# Patient Record
Sex: Female | Born: 1940
Health system: Southern US, Community
[De-identification: ages and names within clinical notes are randomized; demographics above are authoritative.]

## PROBLEM LIST (undated history)

## (undated) DIAGNOSIS — D649 Anemia, unspecified: Secondary | ICD-10-CM

## (undated) DIAGNOSIS — M50321 Other cervical disc degeneration at C4-C5 level: Secondary | ICD-10-CM

## (undated) DIAGNOSIS — I4891 Unspecified atrial fibrillation: Secondary | ICD-10-CM

## (undated) DIAGNOSIS — I272 Pulmonary hypertension, unspecified: Secondary | ICD-10-CM

## (undated) DIAGNOSIS — N189 Chronic kidney disease, unspecified: Secondary | ICD-10-CM

## (undated) DIAGNOSIS — E119 Type 2 diabetes mellitus without complications: Secondary | ICD-10-CM

## (undated) DIAGNOSIS — I509 Heart failure, unspecified: Secondary | ICD-10-CM

## (undated) DIAGNOSIS — I10 Essential (primary) hypertension: Secondary | ICD-10-CM

## (undated) HISTORY — PX: OTHER SURGICAL HISTORY: SHX169

## (undated) HISTORY — PX: ABDOMINAL HYSTERECTOMY: SHX81

---

## 1992-08-18 HISTORY — PX: MASTECTOMY: SHX3

## 1995-08-19 HISTORY — PX: ABDOMINAL HYSTERECTOMY: SHX81

## 2008-08-18 HISTORY — PX: OTHER SURGICAL HISTORY: SHX169

## 2009-10-19 HISTORY — PX: CHOLECYSTECTOMY: SHX55

## 2017-09-23 LAB — PULMONARY FUNCTION TEST

## 2019-03-28 ENCOUNTER — Ambulatory Visit (INDEPENDENT_AMBULATORY_CARE_PROVIDER_SITE_OTHER): Payer: Medicare Other | Admitting: Physician Assistant

## 2019-03-28 ENCOUNTER — Encounter: Payer: Self-pay | Admitting: Physician Assistant

## 2019-03-28 ENCOUNTER — Other Ambulatory Visit: Payer: Self-pay

## 2019-03-28 VITALS — BP 132/70 | HR 64 | Temp 98.7°F | Resp 20 | Ht 66.0 in | Wt 245.0 lb

## 2019-03-28 DIAGNOSIS — G4730 Sleep apnea, unspecified: Secondary | ICD-10-CM

## 2019-03-28 DIAGNOSIS — G4733 Obstructive sleep apnea (adult) (pediatric): Secondary | ICD-10-CM | POA: Insufficient documentation

## 2019-03-28 DIAGNOSIS — J309 Allergic rhinitis, unspecified: Secondary | ICD-10-CM | POA: Insufficient documentation

## 2019-03-28 DIAGNOSIS — F329 Major depressive disorder, single episode, unspecified: Secondary | ICD-10-CM

## 2019-03-28 DIAGNOSIS — F32A Depression, unspecified: Secondary | ICD-10-CM

## 2019-03-28 DIAGNOSIS — M1990 Unspecified osteoarthritis, unspecified site: Secondary | ICD-10-CM | POA: Insufficient documentation

## 2019-03-28 DIAGNOSIS — E119 Type 2 diabetes mellitus without complications: Secondary | ICD-10-CM

## 2019-03-28 DIAGNOSIS — I509 Heart failure, unspecified: Secondary | ICD-10-CM

## 2019-03-28 DIAGNOSIS — N184 Chronic kidney disease, stage 4 (severe): Secondary | ICD-10-CM

## 2019-03-28 DIAGNOSIS — I270 Primary pulmonary hypertension: Secondary | ICD-10-CM | POA: Diagnosis not present

## 2019-03-28 DIAGNOSIS — Z9989 Dependence on other enabling machines and devices: Secondary | ICD-10-CM | POA: Insufficient documentation

## 2019-03-28 LAB — POCT INR
INR: 1.8 — AB (ref 2.0–3.0)
PT: 22.1

## 2019-03-28 MED ORDER — FARXIGA 5 MG PO TABS: 5.0000 mg | ORAL_TABLET | Freq: Every day | ORAL | 1 refills | Status: DC

## 2019-03-28 MED ORDER — TRULICITY 0.75 MG/0.5ML ~~LOC~~ SOAJ: 0.7500 mg | SUBCUTANEOUS | 2 refills | Status: AC

## 2019-03-28 NOTE — Progress Notes (Signed)
Patient: Ann Flynn Female    DOB: 03-01-1941   78 y.o.   MRN: [id number] Visit Date: 03/29/2019  Today's Provider: Trinna Post, PA-C   Chief Complaint  Patient presents with  . New Patient (Initial Visit)   Subjective:   HPI Patient comes in today to establish care into the practice. She lives in [place] and has moved from the [place] area. Her on child moved out here and her other child lives in [place], [place].  She has multiple chronic issues.   DM II: Has had diabetes for 20 years. Last eye exam in [place] within the past year, she believes 06/2018. She is on an ARB and a statin. She is currently taking Toujeo 50 units at night time. She is also taking trulicity 6.50 mg suQ once weekly and farxiga 5 mg QD. She was previously on metformin but was switched due to poor kidney function. She has also tried glyburide as well without successful glycemic control.   Lab Results  Component Value Date   HGBA1C 7.2 (H) 03/28/2019   CHF/HTN: She is on Lasix 40 mg every other day. She is on metoprolol tartrate 25 mg BID. She is also on losartan 100 mg daily. She was followed by a cardiologist in [place]. She denies ankle swelling and SOB above baseline.   Pulmonary HTN: She was diagnosed with this during cardiac catheterization for heart blockage. She is currently on Letairis 5 mg daily and also cialis. She uses portable oxygen 2-5 L via nasal canula.   Depression: She is currently on effexor 75 mg daily for this.  She reports that she needs her PT/INR and other labs checked. She has not had this done in a while. She reports that she is currently taking warfarin 88m daily except 2 mg on Sun and Wed. She is on warfarin for history of PE.   She also mentions that she has had pain in her left arm for about 2-3 months. She feels that this could be a pulled muscle. She is taking Tylenol BID for this and it does not seem to have gotten any better.    Allergies  Allergen  Reactions  . Penicillins Rash  . Sulfa Antibiotics Rash     Current Outpatient Medications:  .  ambrisentan (LETAIRIS) 5 MG tablet, Take 5 mg by mouth daily., Disp: , Rfl:  .  atorvastatin (LIPITOR) 40 MG tablet, Take 40 mg by mouth daily., Disp: , Rfl:  .  dapagliflozin propanediol (FARXIGA) 5 MG TABS tablet, Take 5 mg by mouth daily., Disp: 90 tablet, Rfl: 1 .  Dulaglutide (TRULICITY) 03.54MSF/6.8LESOPN, Inject 0.75 mg into the skin once a week., Disp: 2 mL, Rfl: 2 .  furosemide (LASIX) 40 MG tablet, Take 40 mg by mouth., Disp: , Rfl:  .  Insulin Glargine, 1 Unit Dial, (TOUJEO SOLOSTAR) 300 UNIT/ML SOPN, Inject into the skin. 50 units daily., Disp: , Rfl:  .  losartan (COZAAR) 100 MG tablet, Take 100 mg by mouth daily., Disp: , Rfl:  .  metoprolol tartrate (LOPRESSOR) 25 MG tablet, Take 25 mg by mouth 2 (two) times daily., Disp: , Rfl:  .  omeprazole (PRILOSEC) 40 MG capsule, Take 40 mg by mouth daily., Disp: , Rfl:  .  venlafaxine (EFFEXOR) 75 MG tablet, Take 75 mg by mouth daily., Disp: , Rfl:  .  warfarin (COUMADIN) 4 MG tablet, Take 4 mg by mouth daily., Disp: , Rfl:   Review of  Systems  Constitutional: Positive for fatigue.  Respiratory: Negative for shortness of breath.   Cardiovascular: Negative for chest pain and leg swelling.  Musculoskeletal: Positive for arthralgias, back pain, myalgias and neck pain.  Neurological: Negative for dizziness, light-headedness and headaches.    Social History   Tobacco Use  . Smoking status: Never Smoker  . Smokeless tobacco: Never Used  Substance Use Topics  . Alcohol use: Never    Frequency: Never      Objective:   BP 132/70 (BP Location: Left Arm, Patient Position: Sitting, Cuff Size: Large)   Pulse 64   Temp 98.7 F (37.1 C)   Resp 20   Ht _0  (1.676 m)   Wt 245 lb (111.1 kg)   SpO2 95% Comment: w/ 2L of O2  BMI 39.54 kg/m  Vitals:   03/28/19 1522  BP: 132/70  Pulse: 64  Resp: 20  Temp: 98.7 F (37.1 C)  SpO2:  95%  Weight: 245 lb (111.1 kg)  Height: _1  (1.676 m)     Physical Exam Constitutional:      Appearance: Normal appearance.  Cardiovascular:     Rate and Rhythm: Normal rate and regular rhythm.  Pulmonary:     Effort: Pulmonary effort is normal. No respiratory distress.     Breath sounds: Normal breath sounds.     Comments: Using 2L oxygen via nasal canula.  Musculoskeletal:     Left shoulder: She exhibits decreased range of motion.     Comments: Difficulty with abduction of left shoulder past 90 degrees. Some crepitus present.   Skin:    General: Skin is warm and dry.  Neurological:     Mental Status: She is alert and oriented to person, place, and time. Mental status is at baseline.  Psychiatric:        Mood and Affect: Mood normal.        Behavior: Behavior normal.     Diabetic Foot Exam - Simple   Simple Foot Form Diabetic Foot exam was performed with the following findings: Yes 03/28/2019  2:59 PM  Visual Inspection No deformities, no ulcerations, no other skin breakdown bilaterally: Yes Sensation Testing See comments: Yes Pulse Check Posterior Tibialis and Dorsalis pulse intact bilaterally: Yes Comments She has diminished sensation across her big toes and the balls of her feet bilaterally.       Results for orders placed or performed in visit on 03/28/19  Comprehensive Metabolic Panel (CMET)  Result Value Ref Range   Glucose 157 (H) 65 - 99 mg/dL   BUN 42 (H) 8 - 27 mg/dL   Creatinine, Ser 1.88 (H) 0.57 - 1.00 mg/dL   GFR calc non Af Amer 25 (L) >59 mL/min/1.73   GFR calc Af Amer 29 (L) >59 mL/min/1.73   BUN/Creatinine Ratio 22 12 - 28   Sodium 141 134 - 144 mmol/L   Potassium 5.1 3.5 - 5.2 mmol/L   Chloride 103 96 - 106 mmol/L   CO2 24 20 - 29 mmol/L   Calcium 9.2 8.7 - 10.3 mg/dL   Total Protein 6.1 6.0 - 8.5 g/dL   Albumin 3.9 3.7 - 4.7 g/dL   Globulin, Total 2.2 1.5 - 4.5 g/dL   Albumin/Globulin Ratio 1.8 1.2 - 2.2   Bilirubin Total 0.2 0.0 -  1.2 mg/dL   Alkaline Phosphatase 81 39 - 117 IU/L   AST 12 0 - 40 IU/L   ALT 8 0 - 32 IU/L  HgB A1c  Result Value Ref  Range   Hgb A1c MFr Bld 7.2 (H) 4.8 - 5.6 %   Est. average glucose Bld gHb Est-mCnc 160 mg/dL  CBC with Differential  Result Value Ref Range   WBC 5.9 3.4 - 10.8 x10E3/uL   RBC 4.19 3.77 - 5.28 x10E6/uL   Hemoglobin 12.1 11.1 - 15.9 g/dL   Hematocrit 38.5 34.0 - 46.6 %   MCV 92 79 - 97 fL   MCH 28.9 26.6 - 33.0 pg   MCHC 31.4 (L) 31.5 - 35.7 g/dL   RDW 14.3 11.7 - 15.4 %   Platelets 227 150 - 450 x10E3/uL   Neutrophils 61 Not Estab. %   Lymphs 27 Not Estab. %   Monocytes 8 Not Estab. %   Eos 3 Not Estab. %   Basos 1 Not Estab. %   Neutrophils Absolute 3.6 1.4 - 7.0 x10E3/uL   Lymphocytes Absolute 1.6 0.7 - 3.1 x10E3/uL   Monocytes Absolute 0.5 0.1 - 0.9 x10E3/uL   EOS (ABSOLUTE) 0.2 0.0 - 0.4 x10E3/uL   Basophils Absolute 0.0 0.0 - 0.2 x10E3/uL   Immature Granulocytes 0 Not Estab. %   Immature Grans (Abs) 0.0 0.0 - 0.1 x10E3/uL  Lipid Profile  Result Value Ref Range   Cholesterol, Total 186 100 - 199 mg/dL   Triglycerides 240 (H) 0 - 149 mg/dL   HDL 43 >39 mg/dL   VLDL Cholesterol Cal 48 (H) 5 - 40 mg/dL   LDL Calculated 95 0 - 99 mg/dL   Chol/HDL Ratio 4.3 0.0 - 4.4 ratio  POCT INR  Result Value Ref Range   INR 1.8 (A) 2.0 - 3.0   PT 22.1        Assessment & Plan    1. Congestive heart failure, unspecified HF chronicity, unspecified heart failure type ([place])  Euvolemic today. Referral to cardiology.   - Comprehensive Metabolic Panel (CMET) - HgB A1c - Ambulatory referral to Cardiology  2. Diabetes mellitus without complication (HCC)  J4N is 7.2% which is fairly well controlled. Continue medications as below.  - Comprehensive Metabolic Panel (CMET) - CBC with Differential - Lipid Profile - dapagliflozin propanediol (FARXIGA) 5 MG TABS tablet; Take 5 mg by mouth daily.  Dispense: 90 tablet; Refill: 1 - Dulaglutide (TRULICITY) 8.29 FA/2.1HY  SOPN; Inject 0.75 mg into the skin once a week.  Dispense: 2 mL; Refill: 2  3. Pulmonary hypertension, primary ([place])  - Ambulatory referral to Pulmonology - POCT INR  4. Depression, unspecified depression type  Continue effexor 75 mg daily.   5. Sleep apnea, unspecified type  Continue CPAP.  6. Allergic rhinitis, unspecified seasonality, unspecified trigger   7. Arthritis  Suspect rotator cuff injury superimposed onto arthritis.   8. CKD IV  Patient with known CKD. Special attention to renal dosing of medications.   The entirety of the information documented in the History of Present Illness, Review of Systems and Physical Exam were personally obtained by me. Portions of this information were initially documented by Jennings Books, CMA and reviewed by me for thoroughness and accuracy.   F/u 3 months    Trinna Post, PA-C  [facility]

## 2019-03-29 LAB — CBC WITH DIFFERENTIAL/PLATELET
Basophils Absolute: 0 10*3/uL (ref 0.0–0.2)
Basos: 1 %
EOS (ABSOLUTE): 0.2 10*3/uL (ref 0.0–0.4)
Eos: 3 %
Hematocrit: 38.5 % (ref 34.0–46.6)
Hemoglobin: 12.1 g/dL (ref 11.1–15.9)
Immature Grans (Abs): 0 10*3/uL (ref 0.0–0.1)
Immature Granulocytes: 0 %
Lymphocytes Absolute: 1.6 10*3/uL (ref 0.7–3.1)
Lymphs: 27 %
MCH: 28.9 pg (ref 26.6–33.0)
MCHC: 31.4 g/dL — ABNORMAL LOW (ref 31.5–35.7)
MCV: 92 fL (ref 79–97)
Monocytes Absolute: 0.5 10*3/uL (ref 0.1–0.9)
Monocytes: 8 %
Neutrophils Absolute: 3.6 10*3/uL (ref 1.4–7.0)
Neutrophils: 61 %
Platelets: 227 10*3/uL (ref 150–450)
RBC: 4.19 x10E6/uL (ref 3.77–5.28)
RDW: 14.3 % (ref 11.7–15.4)
WBC: 5.9 10*3/uL (ref 3.4–10.8)

## 2019-03-29 LAB — LIPID PANEL
Chol/HDL Ratio: 4.3 ratio (ref 0.0–4.4)
Cholesterol, Total: 186 mg/dL (ref 100–199)
HDL: 43 mg/dL (ref >39)
LDL Calculated: 95 mg/dL (ref 0–99)
Triglycerides: 240 mg/dL — ABNORMAL HIGH (ref 0–149)
VLDL Cholesterol Cal: 48 mg/dL — ABNORMAL HIGH (ref 5–40)

## 2019-03-29 LAB — COMPREHENSIVE METABOLIC PANEL
ALT: 8 IU/L (ref 0–32)
AST: 12 IU/L (ref 0–40)
Albumin/Globulin Ratio: 1.8 (ref 1.2–2.2)
Albumin: 3.9 g/dL (ref 3.7–4.7)
Alkaline Phosphatase: 81 IU/L (ref 39–117)
BUN/Creatinine Ratio: 22 (ref 12–28)
BUN: 42 mg/dL — ABNORMAL HIGH (ref 8–27)
Bilirubin Total: 0.2 mg/dL (ref 0.0–1.2)
CO2: 24 mmol/L (ref 20–29)
Calcium: 9.2 mg/dL (ref 8.7–10.3)
Chloride: 103 mmol/L (ref 96–106)
Creatinine, Ser: 1.88 mg/dL — ABNORMAL HIGH (ref 0.57–1.00)
GFR calc Af Amer: 29 mL/min/{1.73_m2} — ABNORMAL LOW (ref >59)
GFR calc non Af Amer: 25 mL/min/{1.73_m2} — ABNORMAL LOW (ref >59)
Globulin, Total: 2.2 g/dL (ref 1.5–4.5)
Glucose: 157 mg/dL — ABNORMAL HIGH (ref 65–99)
Potassium: 5.1 mmol/L (ref 3.5–5.2)
Sodium: 141 mmol/L (ref 134–144)
Total Protein: 6.1 g/dL (ref 6.0–8.5)

## 2019-03-29 LAB — HEMOGLOBIN A1C
Est. average glucose Bld gHb Est-mCnc: 160 mg/dL
Hgb A1c MFr Bld: 7.2 % — ABNORMAL HIGH (ref 4.8–5.6)

## 2019-04-01 ENCOUNTER — Telehealth: Payer: Self-pay

## 2019-04-01 DIAGNOSIS — N184 Chronic kidney disease, stage 4 (severe): Secondary | ICD-10-CM | POA: Insufficient documentation

## 2019-04-01 NOTE — Patient Instructions (Signed)
Diabetes Mellitus and Exercise Exercising regularly is important for your overall health, especially when you have diabetes (diabetes mellitus). Exercising is not only about losing weight. It has many other health benefits, such as increasing muscle strength and bone density and reducing body fat and stress. This leads to improved fitness, flexibility, and endurance, all of which result in better overall health. Exercise has additional benefits for people with diabetes, including:  Reducing appetite.  Helping to lower and control blood glucose.  Lowering blood pressure.  Helping to control amounts of fatty substances (lipids) in the blood, such as cholesterol and triglycerides.  Helping the body to respond better to insulin (improving insulin sensitivity).  Reducing how much insulin the body needs.  Decreasing the risk for heart disease by: ? Lowering cholesterol and triglyceride levels. ? Increasing the levels of good cholesterol. ? Lowering blood glucose levels. What is my activity plan? Your health care provider or certified diabetes educator can help you make a plan for the type and frequency of exercise (activity plan) that works for you. Make sure that you:  Do at least 150 minutes of moderate-intensity or vigorous-intensity exercise each week. This could be brisk walking, biking, or water aerobics. ? Do stretching and strength exercises, such as yoga or weightlifting, at least 2 times a week. ? Spread out your activity over at least 3 days of the week.  Get some form of physical activity every day. ? Do not go more than 2 days in a row without some kind of physical activity. ? Avoid being inactive for more than 30 minutes at a time. Take frequent breaks to walk or stretch.  Choose a type of exercise or activity that you enjoy, and set realistic goals.  Start slowly, and gradually increase the intensity of your exercise over time. What do I need to know about managing my  diabetes?   Check your blood glucose before and after exercising. ? If your blood glucose is 240 mg/dL (13.3 mmol/L) or higher before you exercise, check your urine for ketones. If you have ketones in your urine, do not exercise until your blood glucose returns to normal. ? If your blood glucose is 100 mg/dL (5.6 mmol/L) or lower, eat a snack containing 15-20 grams of carbohydrate. Check your blood glucose 15 minutes after the snack to make sure that your level is above 100 mg/dL (5.6 mmol/L) before you start your exercise.  Know the symptoms of low blood glucose (hypoglycemia) and how to treat it. Your risk for hypoglycemia increases during and after exercise. Common symptoms of hypoglycemia can include: ? Hunger. ? Anxiety. ? Sweating and feeling clammy. ? Confusion. ? Dizziness or feeling light-headed. ? Increased heart rate or palpitations. ? Blurry vision. ? Tingling or numbness around the mouth, lips, or tongue. ? Tremors or shakes. ? Irritability.  Keep a rapid-acting carbohydrate snack available before, during, and after exercise to help prevent or treat hypoglycemia.  Avoid injecting insulin into areas of the body that are going to be exercised. For example, avoid injecting insulin into: ? The arms, when playing tennis. ? The legs, when jogging.  Keep records of your exercise habits. Doing this can help you and your health care provider adjust your diabetes management plan as needed. Write down: ? Food that you eat before and after you exercise. ? Blood glucose levels before and after you exercise. ? The type and amount of exercise you have done. ? When your insulin is expected to peak, if you use   insulin. Avoid exercising at times when your insulin is peaking.  When you start a new exercise or activity, work with your health care provider to make sure the activity is safe for you, and to adjust your insulin, medicines, or food intake as needed.  Drink plenty of water while  you exercise to prevent dehydration or heat stroke. Drink enough fluid to keep your urine clear or pale yellow. Summary  Exercising regularly is important for your overall health, especially when you have diabetes (diabetes mellitus).  Exercising has many health benefits, such as increasing muscle strength and bone density and reducing body fat and stress.  Your health care provider or certified diabetes educator can help you make a plan for the type and frequency of exercise (activity plan) that works for you.  When you start a new exercise or activity, work with your health care provider to make sure the activity is safe for you, and to adjust your insulin, medicines, or food intake as needed. This information is not intended to replace advice given to you by your health care provider. Make sure you discuss any questions you have with your health care provider. Document Released: 10/25/2003 Document Revised: 02/26/2017 Document Reviewed: 01/14/2016 Elsevier Patient Education  2020 [employer].

## 2019-04-01 NOTE — Telephone Encounter (Signed)
Patient states she was seen earlier in the week and had her PT/INR done.  She said no one gave her instruction and when the nurse came in to release her she said they would call.  Patient has not heard anything and would like to know if there are any changes in her Coumadin dose.  CB # is [phone]

## 2019-04-01 NOTE — Telephone Encounter (Signed)
INR is 1.8, subtherapeutic. She is taking 4 mg every day except sun and Wednesday when she takes 2 mg. I would like her to take 4 mg every day. Schedule her a follow up at the coumadin clinic next week. She is on this for a pulmonary embolism, correct?

## 2019-04-01 NOTE — Telephone Encounter (Signed)
Pt advised.  Apt 04/06/2019 at 10:10   Thanks,    -Mickel Baas

## 2019-04-05 ENCOUNTER — Telehealth: Payer: Self-pay | Admitting: *Deleted

## 2019-04-05 MED ORDER — ATORVASTATIN CALCIUM 80 MG PO TABS: 80.0000 mg | ORAL_TABLET | Freq: Every day | ORAL | 0 refills | Status: DC

## 2019-04-05 NOTE — Telephone Encounter (Signed)
LMOVM for pt to return call.

## 2019-04-05 NOTE — Telephone Encounter (Signed)
-----  Message from Trinna Post, [place] sent at 04/01/2019  1:21 PM EDT ----- See telephone note about INR. Kidney function decreased as patient reported it was. A1c 7.2, slightly above goal but I think this is OK for now. LDL cholesterol is above goal and I would recommend increasing Lipitor to 80 mg nightly. If agreeable please send in lipitor 80 mg qhs #90 and set her up for 3 month follow up please.

## 2019-04-05 NOTE — Telephone Encounter (Signed)
Pt returned call  teri

## 2019-04-05 NOTE — Telephone Encounter (Signed)
Patient advised as below. Patient verbalizes understanding and is in agreement with treatment plan.

## 2019-04-06 ENCOUNTER — Other Ambulatory Visit: Payer: Self-pay

## 2019-04-06 ENCOUNTER — Other Ambulatory Visit: Payer: Self-pay | Admitting: Physician Assistant

## 2019-04-06 ENCOUNTER — Ambulatory Visit (INDEPENDENT_AMBULATORY_CARE_PROVIDER_SITE_OTHER): Payer: Medicare Other

## 2019-04-06 DIAGNOSIS — I639 Cerebral infarction, unspecified: Secondary | ICD-10-CM | POA: Diagnosis not present

## 2019-04-06 DIAGNOSIS — Z8673 Personal history of transient ischemic attack (TIA), and cerebral infarction without residual deficits: Secondary | ICD-10-CM

## 2019-04-06 LAB — POCT INR
INR: 2.5 (ref 2.0–3.0)
PT: 30.6

## 2019-04-06 NOTE — Progress Notes (Signed)
New patient to [facility]madin clinic[facility]

## 2019-04-06 NOTE — Patient Instructions (Signed)
Description   4 mg daily

## 2019-04-08 ENCOUNTER — Telehealth: Payer: Self-pay | Admitting: Physician Assistant

## 2019-04-08 DIAGNOSIS — E119 Type 2 diabetes mellitus without complications: Secondary | ICD-10-CM

## 2019-04-08 MED ORDER — SITAGLIPTIN PHOSPHATE 25 MG PO TABS: 25.0000 mg | ORAL_TABLET | Freq: Every day | ORAL | 0 refills | Status: DC

## 2019-04-08 NOTE — Telephone Encounter (Signed)
Patient advised as directed below and she agrees about about the Januvia  25 mg. Medication sent in to [facility]

## 2019-04-08 NOTE — Addendum Note (Signed)
Addended by: Doristine Devoid on: 04/08/2019 02:19 PM   Modules accepted: Orders

## 2019-04-08 NOTE — Telephone Encounter (Signed)
Got a fax from her insurance stating that Wilder Glade is not covered. However, upon further review, her kidney function is too low for farxiga and she needs to stop it. If she is agreeable, I will send her in a different diabetes medication called januvia 25 mg #90 with one refill.

## 2019-04-11 ENCOUNTER — Telehealth: Payer: Self-pay | Admitting: Internal Medicine

## 2019-04-11 NOTE — Progress Notes (Addendum)
[facility] Consultation      Assessment and Plan:  Pulmonary hypertension. -Etiology uncertain, may be secondary to pulmonary embolism. - Currently appears to be well controlled with class II dyspnea. - Continue Letairis, Cialis.  Will obtain copy of right and left heart caths performed in 2016 in [place].  Pulmonary embolism. - History of pulmonary embolism, in 2009. -Patient has been on Coumadin since that time with monitoring of INR approximately once every month. - Discussed the potential change to newer oral anticoagulant, however will review history from [place] for changing to see if there is any specific indication that required Coumadin.  Dyspnea on exertion.  Chronic hypoxic respiratory failure. - Suspect multifactorial due to pulmonary hypertension, deconditioning.  Patient was previously on 5 L of oxygen by a portable oxygen concentrator, which she has had to surrender since she moved. - She notes her dyspnea has improved since moving, likely due to altitude change.  We will check a 6-minute walk, and see if she qualifies for POC at the same time.  Obstructive sleep apnea. - Appears to be doing well with current CPAP, will continue.  Encouraged to use CPAP every night for the entire night. - We will see if we can get a download on her machine.  Orders Placed This Encounter  Procedures   Pulmonary Function Test [facility] Only   6 minute walk   Return in about 3 months (around 07/13/2019).     Date: 04/12/2019  MRN# [id number] Ann Flynn 04-30-1941   Dereon Williamsen is a 78 y.o. old female seen in consultation for chief complaint of:    Chief Complaint  Patient presents with   pulmonary consult    per Carles Collet- pt reports of sob with exertion. 2L bled into cpap. wears cpap avg 6hr nighly    HPI:  Cookie Pore is a 78 y.o. old female. She lives in [place] and has moved from the [place] area. Her history includes CHF, PE,  pulmonary hypertension, OSA. She tells me that she was diagnosed with [place] on a heart cath in December 15 2014 at [facility] in [place], she was seeing a heart doctor at that time and was having dyspnea. She was then started on Letairis and cialis, she has not been on any other medications, she has does not have to pay anything out of pocket through a program through [facility] that pays the 20% that insurance does not cover.  Her pulmonary doc was Facilities manager at [facility].  She feels that her breathing has been "great", she was on portable oxygen at 5L when she was in [place], but the portable o2 was not covered here but she notes that both her breathing and oxygen requirements improved since she came here. She lived at an altitiude of about 1 mile. Currently she can walk a flight of stairs but she has to rest at the top. She can walk a [employer]. She is a never smoker.  She had stroke in 2010, and then had a carotid artery surgery which was the cause.  She had PE in 2009 and has been on coumadin since then.   She has a diagnosis of OSA, diagnosed several years ago, she uses CPAP every night for about 6 hours per night.   Addendum 04/13/19 review of outside records. **12/16/2014 right and left heart catheterization with nitric oxide inhalation, Pressure data resting: - Mean right atrial pressure 2 Right ventricule 704/8 and diastolic - Pulmonary artery  0.03/19, mean 51. Pulmonary artery wedge mean 5 - Ascending thoracic aorta 111/53, mean 75. -Fick cardiac output 4.2 L. -Pulmonary resistance 12.25 Woods units. With nitric oxide inhalation - Pulmonary artery 89/29, mean 42. -Right ventricle 09/3 and diastolic. -Right atrial 2. -Fick cardiac output 4.94 L. - Pulmonary resistance 15.91 Woods units.  Overall the patient was found to have moderate coronary artery disease, which does not appear to be significant for coronary intervention.  She was found to have evidence of severe pulmonary  hypertension, not significantly responding to nitric oxide administration.  Addendum 04/28/2019 review of outside records. -Chronic hypoxic respiratory failure secondary to pulmonary hypertension, currently on 3 L oxygen at rest, 5 L with exertion, using hydrogen, completed pulmonary rehab. - Pulmonary hypertension, likely group lung disease, continue Lasix 40 mg every other day, not CCB candidate, continue dual therapy with tadalafil, ambrisentan. - OSA continue CPAP of 11 cm H2O with 3 L of oxygen.  Continue nasal steroid. - Restrictive lung disease, likely secondary to body habitus. - Lung nodules, new right lower lobe lung nodule seen on CT chest 10/02/17, PET negative therefore thought to be benign.  On follow-up CT chest 05/2018 right lower lobe nodule resolved with stable bilateral subcentimeter nodules, no follow-up imaging was deemed necessary. - PLMS, stable consider treatment if needed.  - Sleep study 12/19/2014>>, split-night, AHI of 29.6, started on CPAP titration, as high as an AHI of 42.3 on room air, CPAP was titrated to 13 cm H2O with 2 L oxygen, final recommendation was CPAP at 13 cm H2O with 6 L supplemental oxygen with ResMed Mirage fracture standard with a chinstrap.  Weight loss was recommended. 45 minutes was spent in review of these records.  **CPAP titration study 12/29/2014>> patient was noted to be recently been using CPAP at 11 with 2 L of oxygen.  CPAP was titrated to a final pressure of 13 with 6 L of supplemental oxygen with ResMed Mirage fracture standard.  Patient wore a chinstrap.  Patient had PLMS without significant arousals.  Weight loss was recommended.  PMHX:   OSA, Pulm htn. DM, HTN, depression, hypercholesterolemia. TIA 2010, 2010, 2013.  PE 2009.  Family Hx:  Noncontributory.  Social Hx:   Social History   Tobacco Use   Smoking status: Never Smoker   Smokeless tobacco: Never Used  Substance Use Topics   Alcohol use: Never    Frequency: Never    Drug use: Never   Medication:    Current Outpatient Medications:    ambrisentan (LETAIRIS) 5 MG tablet, Take 5 mg by mouth daily., Disp: , Rfl:    atorvastatin (LIPITOR) 80 MG tablet, Take 1 tablet (80 mg total) by mouth daily., Disp: 90 tablet, Rfl: 0   dapagliflozin propanediol (FARXIGA) 5 MG TABS tablet, Take 5 mg by mouth daily., Disp: 90 tablet, Rfl: 1   Dulaglutide (TRULICITY) 2.35 TD/3.2KG SOPN, Inject 0.75 mg into the skin once a week., Disp: 2 mL, Rfl: 2   furosemide (LASIX) 40 MG tablet, Take 40 mg by mouth., Disp: , Rfl:    Insulin Glargine, 1 Unit Dial, (TOUJEO SOLOSTAR) 300 UNIT/ML SOPN, Inject into the skin. 50 units daily., Disp: , Rfl:    losartan (COZAAR) 100 MG tablet, Take 100 mg by mouth daily., Disp: , Rfl:    metoprolol tartrate (LOPRESSOR) 25 MG tablet, Take 25 mg by mouth 2 (two) times daily., Disp: , Rfl:    omeprazole (PRILOSEC) 40 MG capsule, Take 40 mg by mouth daily., Disp: ,  Rfl:    sitaGLIPtin (JANUVIA) 25 MG tablet, Take 1 tablet (25 mg total) by mouth daily., Disp: 90 tablet, Rfl: 0   tadalafil (CIALIS) 20 MG tablet, Take 40 mg by mouth daily., Disp: , Rfl:    venlafaxine (EFFEXOR) 75 MG tablet, Take 75 mg by mouth daily., Disp: , Rfl:    warfarin (COUMADIN) 4 MG tablet, Take 4 mg by mouth daily., Disp: , Rfl:    Allergies:  Penicillins and Sulfa antibiotics  Review of Systems: Gen:  Denies  fever, sweats, chills HEENT: Denies blurred vision, double vision. bleeds, sore throat Cvc:  No dizziness, chest pain. Resp:   Denies cough or sputum production, shortness of breath Gi: Denies swallowing difficulty, stomach pain. Gu:  Denies bladder incontinence, burning urine Ext:   No Joint pain, stiffness. Skin: No skin rash,  hives  Endoc:  No polyuria, polydipsia. Psych: No depression, insomnia. Other:  All other systems were reviewed with the patient and were negative other that what is mentioned in the HPI.   Physical Examination:   VS: BP  124/78 (BP Location: Left Arm, Cuff Size: Normal)    Pulse (!) 56    Temp (!) 97.2 F (36.2 C) (Temporal)    Ht _0  (1.651 m)    Wt 252 lb (114.3 kg)    SpO2 94%    BMI 41.93 kg/m   General Appearance: No distress  Neuro:without focal findings,  speech normal,  HEENT: PERRLA, EOM intact.   Pulmonary: normal breath sounds, No wheezing.  CardiovascularNormal S1,S2.  No m/r/g.   Abdomen: Benign, Soft, non-tender. Renal:  No costovertebral tenderness  GU:  No performed at this time. Endoc: No evident thyromegaly, no signs of acromegaly. Skin:   warm, no rashes, no ecchymosis  Extremities: normal, no cyanosis, clubbing.  Other findings:    LABORATORY PANEL:   CBC No results for input(s): WBC, HGB, HCT, PLT in the last 168 hours. ------------------------------------------------------------------------------------------------------------------  Chemistries  No results for input(s): NA, K, CL, CO2, GLUCOSE, BUN, CREATININE, CALCIUM, MG, AST, ALT, ALKPHOS, BILITOT in the last 168 hours.  Invalid input(s): GFRCGP ------------------------------------------------------------------------------------------------------------------  Cardiac Enzymes No results for input(s): TROPONINI in the last 168 hours. ------------------------------------------------------------  RADIOLOGY:  No results found.     Thank  you for the consultation and for allowing [facility] to assist in the care of your patient. Our recommendations are noted above.  Please contact us if we can be of further service.   Marda Stalker, M.D., F.C.C.P.  Board Certified in Internal Medicine, Pulmonary Medicine, [place], and Sleep Medicine.  [facility] Office Number: [phone]   04/12/2019

## 2019-04-11 NOTE — Telephone Encounter (Signed)

## 2019-04-12 ENCOUNTER — Other Ambulatory Visit: Payer: Self-pay

## 2019-04-12 ENCOUNTER — Ambulatory Visit: Payer: Medicare Other | Admitting: Internal Medicine

## 2019-04-12 ENCOUNTER — Encounter: Payer: Self-pay | Admitting: Internal Medicine

## 2019-04-12 VITALS — BP 124/78 | HR 56 | Temp 97.2°F | Ht 65.0 in | Wt 252.0 lb

## 2019-04-12 DIAGNOSIS — R0609 Other forms of dyspnea: Secondary | ICD-10-CM | POA: Diagnosis not present

## 2019-04-12 DIAGNOSIS — I270 Primary pulmonary hypertension: Secondary | ICD-10-CM | POA: Diagnosis not present

## 2019-04-12 DIAGNOSIS — G4733 Obstructive sleep apnea (adult) (pediatric): Secondary | ICD-10-CM

## 2019-04-12 NOTE — Patient Instructions (Addendum)
Will need to obtain records from your  heart cath in December 15 2014 at [facility] Also records from [place] at [place].  Continue with your pulmonary hypertension medications.   Restart using CPAP every night for the whole night.  Will check a walk test to see if you need oxygen and to see if you qualify for a Portable oxygen concentrator if you do.

## 2019-04-20 ENCOUNTER — Ambulatory Visit (INDEPENDENT_AMBULATORY_CARE_PROVIDER_SITE_OTHER): Payer: Medicare Other

## 2019-04-20 ENCOUNTER — Other Ambulatory Visit: Payer: Self-pay

## 2019-04-20 DIAGNOSIS — Z8673 Personal history of transient ischemic attack (TIA), and cerebral infarction without residual deficits: Secondary | ICD-10-CM

## 2019-04-20 LAB — POCT INR
INR: 3.7 — AB (ref 2.0–3.0)
PT: 44.8

## 2019-04-20 NOTE — Patient Instructions (Signed)
Description   4 mg daily except 2 mg on Wed

## 2019-04-27 ENCOUNTER — Telehealth: Payer: Self-pay | Admitting: Internal Medicine

## 2019-04-27 ENCOUNTER — Other Ambulatory Visit: Payer: Self-pay | Admitting: Physician Assistant

## 2019-04-27 MED ORDER — LOSARTAN POTASSIUM 100 MG PO TABS: 100.0000 mg | ORAL_TABLET | Freq: Every day | ORAL | 2 refills | Status: DC

## 2019-04-27 MED ORDER — OMEPRAZOLE 40 MG PO CPDR: 40.0000 mg | DELAYED_RELEASE_CAPSULE | Freq: Every day | ORAL | 2 refills | Status: DC

## 2019-04-27 NOTE — Telephone Encounter (Signed)
Called and spoke to pt, who states that PA is needed for tadalafil.  I have attempted to contact [facility] to start PA, and received recording that they are experiencing higher then normal call volumes. Will call back.

## 2019-04-27 NOTE — Telephone Encounter (Signed)
Please advise?

## 2019-04-27 NOTE — Telephone Encounter (Signed)
[facility] faxed refill request for the following medications:  losartan (COZAAR) 100 MG tablet  omeprazole (PRILOSEC) 40 MG capsule    Please advise.

## 2019-04-29 NOTE — Telephone Encounter (Signed)
Contacted [facility] and started PA for Cialis 17m. PA has been approved until 12/3/12020. Pt is aware and voiced her understanding.  Nothing further is needed.

## 2019-04-29 NOTE — Telephone Encounter (Signed)
Contacted [facility] PA line at [phone] and was placed on hold be >84mn Will call back

## 2019-05-04 ENCOUNTER — Other Ambulatory Visit: Payer: Self-pay

## 2019-05-04 ENCOUNTER — Ambulatory Visit (INDEPENDENT_AMBULATORY_CARE_PROVIDER_SITE_OTHER): Payer: Medicare Other

## 2019-05-04 DIAGNOSIS — Z8673 Personal history of transient ischemic attack (TIA), and cerebral infarction without residual deficits: Secondary | ICD-10-CM | POA: Diagnosis not present

## 2019-05-04 LAB — POCT INR
INR: 3.2 — AB (ref 2.0–3.0)
PT: 38.9

## 2019-05-04 NOTE — Patient Instructions (Signed)
Description   4 mg daily except 2 mg on Wed

## 2019-05-09 ENCOUNTER — Ambulatory Visit: Payer: Medicare Other

## 2019-05-15 NOTE — Progress Notes (Signed)
Cardiology Office Note  Date:  05/16/2019   ID:  Ann Flynn, DOB [birth year], MRN [id number]  PCP:  Trinna Post, PA-C   Chief Complaint  Patient presents with  . other    Cardiac HX [place]. SOB , no CP. ( pulmonary HTN) Medications reviewed verbally.     HPI:  [name] is a 78 year old woman with PMH of PAD, CVA, carotid surgery in 2010 history of pulmonary embolism, in 2009. OSA, on cpap pulm HTN, on letairis, cialis Previously on oxygen Type 2 diabetes Referred by Carles Collet for SOB, pulmonary HTN,   She reports prior work-up in [place] Right and left heart cath in 2016, in [place] diagnosed with [place] on a heart cath in December 15 2014 at [facility] in [place], Dr. De Hollingshead was the performing physician  --started on Letairis and cialis, presumably based on elevated right heart pressures  She reports that she does get some assistance with her medications -- does not have to pay anything out of pocket through a program through [facility] that pays the 20% that insurance does not cover.   previously on portable oxygen at 5L when she was in [place], but the portable o2 was not covered  ---now "goes without oxygen", only at night that she wear oxygen  she can walk a flight of stairs but she has to rest at the top. never smoker.   Last week went to the park, with her family Was trying to keep up with kids, felt SOB, Had to get the car Very sedentary at baseline  Lab work reviewed CR 1.88, BUN 42  She reports having chronic left lower extremity swelling  EKG personally reviewed by myself on todays visit Normal sinus rhythm with rate 65 bpm no significant ST-T wave changes   PMH:   has no past medical history on file.  PSH:    Past Surgical History:  Procedure Laterality Date  . ABDOMINAL HYSTERECTOMY  1997  . cartiod artery surgery  2010   per patient   . CHOLECYSTECTOMY  10/19/2009  . MASTECTOMY  1994    Current Outpatient  Medications  Medication Sig Dispense Refill  . ambrisentan (LETAIRIS) 5 MG tablet Take 5 mg by mouth daily.    [name] atorvastatin (LIPITOR) 80 MG tablet Take 1 tablet (80 mg total) by mouth daily. 90 tablet 0  . Dulaglutide (TRULICITY) 0.62 BJ/6.2GB SOPN Inject 0.75 mg into the skin once a week. 2 mL 2  . furosemide (LASIX) 40 MG tablet Take 40 mg by mouth.    . Insulin Glargine, 1 Unit Dial, (TOUJEO SOLOSTAR) 300 UNIT/ML SOPN Inject into the skin. 50 units daily.    [name] losartan (COZAAR) 100 MG tablet Take 1 tablet (100 mg total) by mouth daily. 30 tablet 2  . metoprolol tartrate (LOPRESSOR) 25 MG tablet Take 25 mg by mouth 2 (two) times daily.    [name] omeprazole (PRILOSEC) 40 MG capsule Take 1 capsule (40 mg total) by mouth daily. 30 capsule 2  . sitaGLIPtin (JANUVIA) 25 MG tablet Take 1 tablet (25 mg total) by mouth daily. 90 tablet 0  . tadalafil (CIALIS) 20 MG tablet Take 40 mg by mouth daily.    [name] venlafaxine (EFFEXOR) 75 MG tablet Take 75 mg by mouth daily.    [name] warfarin (COUMADIN) 4 MG tablet Take 4 mg by mouth daily.     No current facility-administered medications for this visit.      Allergies:  Penicillins and Sulfa antibiotics   Social History:  The patient  reports that she has never smoked. She has never used smokeless tobacco. She reports that she does not drink alcohol or use drugs.   Family History:   family history is not on file.    Review of Systems: Review of Systems  Constitutional: Negative.   HENT: Negative.   Respiratory: Negative.   Cardiovascular: Negative.   Gastrointestinal: Negative.   Musculoskeletal: Negative.   Neurological: Negative.   Psychiatric/Behavioral: Negative.   All other systems reviewed and are negative.    PHYSICAL EXAM: VS:  BP (!) 142/74 (BP Location: Left Arm, Patient Position: Sitting, Cuff Size: Large)   Pulse 65   Ht _0  (1.651 m)   Wt 249 lb (112.9 kg)   BMI 41.44 kg/m  , BMI Body mass index is 41.44 kg/m. GEN: Well  nourished, well developed, in no acute distress HEENT: normal Neck: no JVD, carotid bruits, or masses Cardiac: RRR; no murmurs, rubs, or gallops,no edema right lower extremity, trace nonpitting lower extremity edema left lower extremity Respiratory:  clear to auscultation bilaterally, normal work of breathing GI: soft, nontender, nondistended, + BS MS: no deformity or atrophy Skin: warm and dry, no rash Neuro:  Strength and sensation are intact Psych: euthymic mood, full affect  Recent Labs: 03/28/2019: ALT 8; BUN 42; Creatinine, Ser 1.88; Hemoglobin 12.1; Platelets 227; Potassium 5.1; Sodium 141    Lipid Panel Lab Results  Component Value Date   CHOL 186 03/28/2019   HDL 43 03/28/2019   LDLCALC 95 03/28/2019   TRIG 240 (H) 03/28/2019      Wt Readings from Last 3 Encounters:  05/16/19 249 lb (112.9 kg)  04/12/19 252 lb (114.3 kg)  03/28/19 245 lb (111.1 kg)     ASSESSMENT AND PLAN:  Problem List Items Addressed This Visit      Cardiology Problems   Pulmonary hypertension, primary ([facility]) - Primary    Other Visit Diagnoses    PAD (peripheral artery disease) ([place])       Stenosis of carotid artery, unspecified laterality       Hx of pulmonary embolus       Cerebrovascular accident (CVA) due to embolism of precerebral artery (HCC)       SOB (shortness of breath)         Pulmonary hypertension Will refer to pulmonary hypertension clinic for refills on her current medications, and for discussion if additional work-up is needed She does report chronic shortness of breath, no regular exercise program Recommended a regular walking program, lifestyle modification/weight loss --We have requested records from [place] including right and left heart catheterization, as well as cardiology notes  -Suspect she will need routine echocardiogram, by her report none recently. We will evaluate records when they arrive from [place] (on discussion of what she has had done she reports  "I  get confused")  PAD,  Known carotid disease We will schedule carotid ultrasound Prior stroke She reports prior carotid endarterectomy on the left  Hyperlipidemia LDL above goal Continue Lipitor 80 daily We will discuss with her adding Zetia 10 mg daily If unable to achieve goal may need to start a PCSK9 inhibitor  Chronic renal sufficiency Currently on Lasix 40 every other day Prior records requested Appears she has underlying diabetes, hemoglobin A1c 7.2  Type 2 diabetes We have encouraged careful diet management in an effort to lose weight.  Exercise is limited  Disposition:   F/U  6 months  Total encounter time more than 60 minutes  Greater than 50% was spent in counseling and coordination of care with the patient    Signed, Esmond Plants, M.D., Ph.D. [place], [place]

## 2019-05-16 ENCOUNTER — Other Ambulatory Visit: Payer: Self-pay

## 2019-05-16 ENCOUNTER — Encounter: Payer: Self-pay | Admitting: Cardiovascular Disease

## 2019-05-16 ENCOUNTER — Ambulatory Visit (INDEPENDENT_AMBULATORY_CARE_PROVIDER_SITE_OTHER): Payer: Medicare Other | Admitting: Cardiovascular Disease

## 2019-05-16 VITALS — BP 142/74 | HR 65 | Ht 65.0 in | Wt 249.0 lb

## 2019-05-16 DIAGNOSIS — Z86711 Personal history of pulmonary embolism: Secondary | ICD-10-CM

## 2019-05-16 DIAGNOSIS — I270 Primary pulmonary hypertension: Secondary | ICD-10-CM | POA: Diagnosis not present

## 2019-05-16 DIAGNOSIS — R0602 Shortness of breath: Secondary | ICD-10-CM

## 2019-05-16 DIAGNOSIS — I739 Peripheral vascular disease, unspecified: Secondary | ICD-10-CM

## 2019-05-16 DIAGNOSIS — I6529 Occlusion and stenosis of unspecified carotid artery: Secondary | ICD-10-CM

## 2019-05-16 DIAGNOSIS — I6310 Cerebral infarction due to embolism of unspecified precerebral artery: Secondary | ICD-10-CM

## 2019-05-16 NOTE — Patient Instructions (Addendum)
I will get in touch with Dr. Manya Silvas to discuss refill of the cialis and Ambrisentan   Carotid u/s for known carotid stenosis  Medication Instructions:   She take lasix 40 every other day   If you need a refill on your cardiac medications before your next appointment, please call your pharmacy.    Lab work: No new labs needed   If you have labs (blood work) drawn today and your tests are completely normal, you will receive your results only by: [name] MyChart Message (if you have MyChart) OR . A paper copy in the mail If you have any lab test that is abnormal or we need to change your treatment, we will call you to review the results.   Testing/Procedures: No new testing needed   Follow-Up: At [facility], you and your health needs are our priority.  As part of our continuing mission to provide you with exceptional heart care, we have created designated Provider Care Teams.  These Care Teams include your primary Cardiologist (physician) and Advanced Practice Providers (APPs -  Physician Assistants and Nurse Practitioners) who all work together to provide you with the care you need, when you need it.  . You will need a follow up appointment in 6 months .   Please call our office 2 months in advance to schedule this appointment.    . Providers on your designated Care Team:   . Murray Hodgkins, NP . Christell Faith, PA-C . Marrianne Mood, PA-C  Any Other Special Instructions Will Be Listed Below (If Applicable).  For educational health videos Log in to : [email or web address] Or : [email or web address], password : [email or web address]

## 2019-05-17 ENCOUNTER — Ambulatory Visit (INDEPENDENT_AMBULATORY_CARE_PROVIDER_SITE_OTHER): Payer: Medicare Other

## 2019-05-17 ENCOUNTER — Telehealth: Payer: Self-pay | Admitting: Internal Medicine

## 2019-05-17 DIAGNOSIS — R0609 Other forms of dyspnea: Secondary | ICD-10-CM | POA: Diagnosis not present

## 2019-05-17 DIAGNOSIS — I270 Primary pulmonary hypertension: Secondary | ICD-10-CM

## 2019-05-17 NOTE — Telephone Encounter (Signed)
Yes please

## 2019-05-17 NOTE — Progress Notes (Signed)
Pt in office for SMW. Pt spo2 was 88% upon arrival.  SMW test was turned into qualifying test. Pt maintained po2 at 94% on 2L/

## 2019-05-17 NOTE — Telephone Encounter (Signed)
Pt in office for SMW. Pt spo2 was 88% upon arrival.  SMW test was turned into qualifying test. Pt maintained po2 at 94% on 2L.  DK please advise if okay to order oxygen. Former DR pt.

## 2019-05-17 NOTE — Telephone Encounter (Signed)
Message routed to [facility] triage.

## 2019-05-17 NOTE — Telephone Encounter (Signed)
Pt has been scheduled for OV on 06/02/2019 with Dr. Mortimer Fries. Pt aware and voiced her understanding.  Nothing further is needed.

## 2019-05-17 NOTE — Telephone Encounter (Signed)
Order has been placed for oxygen. Nothing further is needed.

## 2019-05-17 NOTE — Addendum Note (Signed)
Addended by: Vanessa Ralphs on: 05/17/2019 08:02 AM   Modules accepted: Orders

## 2019-05-24 ENCOUNTER — Other Ambulatory Visit: Payer: Self-pay | Admitting: Physician Assistant

## 2019-05-24 MED ORDER — VENLAFAXINE HCL 75 MG PO TABS: 75.0000 mg | ORAL_TABLET | Freq: Every day | ORAL | 1 refills | Status: DC

## 2019-05-24 NOTE — Telephone Encounter (Signed)
[facility] faxed refill request for the following medications:  venlafaxine (EFFEXOR) 75 MG tablet    Please advise.

## 2019-06-02 ENCOUNTER — Ambulatory Visit: Payer: Medicare Other | Admitting: Internal Medicine

## 2019-06-02 ENCOUNTER — Other Ambulatory Visit: Payer: Self-pay

## 2019-06-02 ENCOUNTER — Encounter: Payer: Self-pay | Admitting: Internal Medicine

## 2019-06-02 VITALS — BP 124/78 | HR 57 | Temp 97.3°F | Ht 63.0 in | Wt 251.0 lb

## 2019-06-02 DIAGNOSIS — G4733 Obstructive sleep apnea (adult) (pediatric): Secondary | ICD-10-CM

## 2019-06-02 DIAGNOSIS — J9611 Chronic respiratory failure with hypoxia: Secondary | ICD-10-CM

## 2019-06-02 DIAGNOSIS — I272 Pulmonary hypertension, unspecified: Secondary | ICD-10-CM

## 2019-06-02 NOTE — Patient Instructions (Addendum)
Continue CPAP as prescribed Continue Latera's and Cialis for pulmonary hypertension Follow-up cardiology pending Recommend pulmonary rehab referral

## 2019-06-02 NOTE — Progress Notes (Addendum)
[facility] Consultation        - Sleep study 12/19/2014>>, split-night, AHI of 29.6, started on CPAP titration, as high as an AHI of 42.3 on room air, CPAP was titrated to 13 cm H2O with 2 L oxygen, final recommendation was CPAP at 13 cm H2O with 6 L supplemental oxygen with [employer]Med [facility] fracture standard with a chinstrap.  Weight loss was recommended. 45 minutes was spent in review of these records.  **CPAP titration study 12/29/2014>> patient was noted to be recently been using CPAP at 11 with 2 L of oxygen.  CPAP was titrated to a final pressure of 13 with 6 L of supplemental oxygen with ResMed Mirage fracture standard.  Patient wore a chinstrap.  Patient had PLMS without significant arousals.  Weight loss was recommended.  **12/16/2014 right and left heart catheterization with nitric oxide inhalation, Pressure data resting: - Mean right atrial pressure 2 Right ventricule 159/4 and diastolic - Pulmonary artery 0.03/19, mean 51. Pulmonary artery wedge mean 5 - Ascending thoracic aorta 111/53, mean 75. -Fick cardiac output 4.2 L. -Pulmonary resistance 12.25 Woods units. With nitric oxide inhalation - Pulmonary artery 89/29, mean 42. -Right ventricle 58/5 and diastolic. -Right atrial 2. -Fick cardiac output 4.94 L. - Pulmonary resistance 15.91 Woods units.  SYNOPSIS -Chronic hypoxic respiratory failure secondary to pulmonary hypertension, currently on 3 L oxygen at rest, 5 L with exertion, using hydrogen, completed pulmonary rehab. - Pulmonary hypertension, likely group lung disease, continue Lasix 40 mg every other day, not CCB candidate, continue dual therapy with tadalafil, ambrisentan. - OSA continue CPAP of 11 cm H2O with 3 L of oxygen.  Continue nasal steroid. - Restrictive lung disease, likely secondary to body habitus. - Lung nodules, new right lower lobe lung nodule seen on CT chest 10/02/17, PET negative therefore thought to be benign.  On follow-up CT  chest 05/2018 right lower lobe nodule resolved with stable bilateral subcentimeter nodules, no follow-up imaging was deemed necessary. - PLMS, stable consider treatment if needed.   Date: 06/02/2019  MRN# [id number] Ann Flynn November 20, 1940   Ann Flynn is a 78 y.o. old female seen in consultation for chief complaint of:    CC  Follow up OSA Follow-up of pulmonary hypertension Follow-up shortness of breath and dyspnea exertion  HPI:   Patient moved from [place] Has history of CHF PE pulmonary hypertension OSA Diagnosed with pulmonary hypertension in April 2016 Started on [place] and Cialis She is currently on a grant that gives her her medications She has been doing excellent on this therapy There was a time when she stopped taking it for 5 days and she had progressive shortness of breath and dyspnea exertion Patient needs these medications for survival Patient on chronic Coumadin therapy  Patient has a history and diagnosis of OSA diagnosed 10-15 years ago She bought her own machine She uses daily I advised that she bring her chip next time to assess compliance  Patient was found to have moderate coronary artery disease She was found to have severe pulmonary pretension with no significant response to nitric oxide   No  exacerbation at this time No evidence of heart failure at this time No evidence or signs of infection at this time No respiratory distress No fevers, chills, nausea, vomiting, diarrhea No evidence of lower extremity edema No evidence hemoptysis   PMHX:   OSA, Pulm htn. DM, HTN, depression, hypercholesterolemia. TIA 2010, 2010, 2013.  PE 2009.  Family Hx:  Noncontributory.  Social Hx:   Social History   Tobacco Use  . Smoking status: Never Smoker  . Smokeless tobacco: Never Used  Substance Use Topics  . Alcohol use: Never    Frequency: Never  . Drug use: Never   Medication:    Current Outpatient Medications:  .  ambrisentan  (LETAIRIS) 5 MG tablet, Take 5 mg by mouth daily., Disp: , Rfl:  .  atorvastatin (LIPITOR) 80 MG tablet, Take 1 tablet (80 mg total) by mouth daily., Disp: 90 tablet, Rfl: 0 .  Dulaglutide (TRULICITY) 9.23 RA/0.7MA SOPN, Inject 0.75 mg into the skin once a week., Disp: 2 mL, Rfl: 2 .  furosemide (LASIX) 40 MG tablet, Take 40 mg by mouth every other day. , Disp: , Rfl:  .  Insulin Glargine, 1 Unit Dial, (TOUJEO SOLOSTAR) 300 UNIT/ML SOPN, Inject into the skin. 50 units daily., Disp: , Rfl:  .  losartan (COZAAR) 100 MG tablet, Take 1 tablet (100 mg total) by mouth daily., Disp: 30 tablet, Rfl: 2 .  metoprolol tartrate (LOPRESSOR) 25 MG tablet, Take 25 mg by mouth 2 (two) times daily., Disp: , Rfl:  .  omeprazole (PRILOSEC) 40 MG capsule, Take 1 capsule (40 mg total) by mouth daily., Disp: 30 capsule, Rfl: 2 .  sitaGLIPtin (JANUVIA) 25 MG tablet, Take 1 tablet (25 mg total) by mouth daily., Disp: 90 tablet, Rfl: 0 .  tadalafil (CIALIS) 20 MG tablet, Take 40 mg by mouth daily., Disp: , Rfl:  .  venlafaxine (EFFEXOR) 75 MG tablet, Take 1 tablet (75 mg total) by mouth daily., Disp: 90 tablet, Rfl: 1 .  warfarin (COUMADIN) 4 MG tablet, Take 4 mg by mouth daily., Disp: , Rfl:    Allergies:  Penicillins and Sulfa antibiotics   Review of Systems:  Gen:  Denies  fever, sweats, chills weight loss  HEENT: Denies blurred vision, double vision, ear pain, eye pain, hearing loss, nose bleeds, sore throat Cardiac:  No dizziness, chest pain or heaviness, chest tightness,edema, No JVD Resp:   No cough, -sputum production, +shortness of breath,-wheezing, -hemoptysis,  Gi: Denies swallowing difficulty, stomach pain, nausea or vomiting, diarrhea, constipation, bowel incontinence Gu:  Denies bladder incontinence, burning urine Ext:   Denies Joint pain, stiffness or swelling Skin: Denies  skin rash, easy bruising or bleeding or hives Endoc:  Denies polyuria, polydipsia , polyphagia or weight change Psych:    Denies depression, insomnia or hallucinations  Other:  All other systems negative   Physical Examination:   VS: BP 124/78   Pulse (!) 57   Temp (!) 97.3 F (36.3 C) (Temporal)   Ht _0  (1.6 m)   Wt 251 lb (113.9 kg)   SpO2 97%   BMI 44.46 kg/m     Physical Examination:   GENERAL:NAD, no fevers, chills, no weakness no fatigue HEAD: Normocephalic, atraumatic.  EYES: PERLA, EOMI No scleral icterus.  NECK: Supple. No thyromegaly.  No JVD.  PULMONARY: CTA B/L no wheezing, rhonchi, crackles CARDIOVASCULAR: S1 and S2. Regular rate and rhythm. No murmurs GASTROINTESTINAL: Soft, nontender, nondistended. Positive bowel sounds.  MUSCULOSKELETAL: + edema.  NEUROLOGIC: No gross focal neurological deficits. 5/5 strength all extremities SKIN: No ulceration, lesions, rashes, or cyanosis.  PSYCHIATRIC: Insight, judgment intact. -depression -anxiety ALL OTHER ROS ARE NEGATIVE    Assessment and plan  78 year old pleasant white female seen today for multiple medical issues including pulmonary hypertension history of PE chronic shortness of breath and dyspnea exertion with chronic hypoxic respiratory failure along with obstructive  sleep apnea    Pulmonary hypertension Etiology is uncertain at this time however related to chronic PE Currently appears to be well controlled at this time class II dyspnea Continue Latera's and Cialis as prescribed Patient is to have a follow-up cardiology assessment in [facility]  Pulmonary embolism History of PE in 2009 Patient has been on Coumadin since then No evidence of active bleeding at this time Chronic oral anticoagulation therapy is indefinite at this time   Discharge exertion and chronic hypoxic respiratory failure Most likely due to chronic pulmonary hypertension and deconditioned state Patient previously on 5 L oxygen however needs a 6-minute walk test to assess for exertional hypoxia.   OSA Appears to be doing well with current CPAP  therapy She is encouraged to use CPAP every night for the entire night This is very important when dealing with her chronic pulmonary hypertension and cor pulmonale   COVID-19 EDUCATION: The signs and symptoms of COVID-19 were discussed with the patient and how to seek care for testing.  The importance of social distancing was discussed today. Hand Washing Techniques and avoid touching face was advised.     MEDICATION ADJUSTMENTS/LABS AND TESTS ORDERED: Continue CPAP as prescribed Continue Latera's and Cialis for pulmonary hypertension Follow-up cardiology pending Assess for exertional hypoxia with 6-minute walk test-we will prescribe oxygen if needed Recommend pulmonary rehab referral   CURRENT MEDICATIONS REVIEWED AT [facility]   Patient satisfied with Plan of action and management. All questions answered  Follow up in 6 months   Rayelynn Loyal Patricia Pesa, M.D.  [facility]  Medical Director [place] Director [facility] Cardio-Pulmonary Department

## 2019-06-07 ENCOUNTER — Ambulatory Visit (INDEPENDENT_AMBULATORY_CARE_PROVIDER_SITE_OTHER): Payer: Medicare Other

## 2019-06-07 ENCOUNTER — Other Ambulatory Visit: Payer: Self-pay

## 2019-06-07 ENCOUNTER — Telehealth: Payer: Self-pay | Admitting: Internal Medicine

## 2019-06-07 DIAGNOSIS — Z8673 Personal history of transient ischemic attack (TIA), and cerebral infarction without residual deficits: Secondary | ICD-10-CM

## 2019-06-07 LAB — POCT INR
INR: 2.6 (ref 2.0–3.0)
PT: 30.9

## 2019-06-07 NOTE — Telephone Encounter (Signed)
Left message to relay date/time of covid test. 06/08/2019 prior to [facility]

## 2019-06-07 NOTE — Telephone Encounter (Signed)
Pt is aware of date/time of covid test.   

## 2019-06-07 NOTE — Patient Instructions (Addendum)
Description   Dose:4 mg daily except 2 mg on Wed Changes: None Return in 4 weeks.

## 2019-06-08 ENCOUNTER — Other Ambulatory Visit: Payer: Self-pay | Admitting: Physician Assistant

## 2019-06-08 ENCOUNTER — Other Ambulatory Visit
Admission: RE | Admit: 2019-06-08 | Discharge: 2019-06-08 | Disposition: A | Discharge status: Home / Self Care | Payer: Medicare Other | Source: Ambulatory Visit | Attending: Internal Medicine | Admitting: Internal Medicine

## 2019-06-08 ENCOUNTER — Ambulatory Visit: Payer: Medicare Other

## 2019-06-08 DIAGNOSIS — Z20828 Contact with and (suspected) exposure to other viral communicable diseases: Secondary | ICD-10-CM | POA: Diagnosis not present

## 2019-06-08 DIAGNOSIS — E119 Type 2 diabetes mellitus without complications: Secondary | ICD-10-CM

## 2019-06-08 DIAGNOSIS — Z01812 Encounter for preprocedural laboratory examination: Secondary | ICD-10-CM | POA: Insufficient documentation

## 2019-06-08 LAB — SARS CORONAVIRUS 2 (TAT 6-24 HRS): SARS Coronavirus 2: NEGATIVE

## 2019-06-09 ENCOUNTER — Ambulatory Visit: Payer: Medicare Other | Attending: Internal Medicine

## 2019-06-09 ENCOUNTER — Ambulatory Visit: Payer: Medicare Other

## 2019-06-09 ENCOUNTER — Other Ambulatory Visit: Payer: Self-pay

## 2019-06-09 DIAGNOSIS — R0600 Dyspnea, unspecified: Secondary | ICD-10-CM

## 2019-06-09 DIAGNOSIS — R0609 Other forms of dyspnea: Secondary | ICD-10-CM | POA: Insufficient documentation

## 2019-06-09 MED ORDER — ALBUTEROL SULFATE (2.5 MG/3ML) 0.083% IN NEBU
2.5000 mg | INHALATION_SOLUTION | Freq: Once | RESPIRATORY_TRACT | Status: AC
  Administered 2019-06-09: 16:00:00 2.5 mg via RESPIRATORY_TRACT
  Filled 2019-06-09: qty 3

## 2019-06-10 ENCOUNTER — Telehealth: Payer: Self-pay | Admitting: Internal Medicine

## 2019-06-10 NOTE — Telephone Encounter (Signed)
Will route to [facility] as patient is seen by Dr. Mortimer Fries

## 2019-06-10 NOTE — Telephone Encounter (Signed)
Received call from Page with [employer], who stated taht pt wished to hold off on oxygen until after PFT. PFT was completed on 06/09/2019. Spoke to pt and confirmed this information.  Will route to DK to make aware.

## 2019-06-14 ENCOUNTER — Telehealth: Payer: Self-pay | Admitting: Internal Medicine

## 2019-06-14 NOTE — Telephone Encounter (Signed)
Call returned to patient, requesting results of her PFT. Made aware this week is Dr. Zoila Shutter hospital rotation but I would send him the message. Voiced understanding.   Dr. Mortimer Fries please advise. Requesting results of PFT. Aware you are not in clinic this week.

## 2019-06-20 NOTE — Telephone Encounter (Signed)
Pt has been scheduled for phone visit on 06/24/2019 to discuss oxygen and PFT.  Pt aware and voiced her understanding. Nothing further is needed.

## 2019-06-20 NOTE — Telephone Encounter (Signed)
ok 

## 2019-06-20 NOTE — Telephone Encounter (Signed)
DK please advise. Thanks

## 2019-06-21 ENCOUNTER — Other Ambulatory Visit: Payer: Self-pay | Admitting: Physician Assistant

## 2019-06-21 MED ORDER — TOUJEO SOLOSTAR 300 UNIT/ML ~~LOC~~ SOPN: 50.0000 [IU] | PEN_INJECTOR | Freq: Every day | SUBCUTANEOUS | 2 refills | Status: DC

## 2019-06-21 NOTE — Telephone Encounter (Signed)
Medication has never been filled by Ann Flynn.L.O.V. was on 03/28/2019, please advise.

## 2019-06-21 NOTE — Telephone Encounter (Signed)
[facility] faxed refill request for the following medications:  Insulin Glargine, 1 Unit Dial, (TOUJEO SOLOSTAR) 300 UNIT/ML SOPN    Please advise.

## 2019-06-23 ENCOUNTER — Other Ambulatory Visit: Payer: Self-pay

## 2019-06-23 ENCOUNTER — Ambulatory Visit (HOSPITAL_COMMUNITY)
Admission: RE | Admit: 2019-06-23 | Discharge: 2019-06-23 | Disposition: A | Discharge status: Home / Self Care | Payer: Medicare Other | Source: Ambulatory Visit | Attending: Internal Medicine | Admitting: Internal Medicine

## 2019-06-23 ENCOUNTER — Encounter (HOSPITAL_COMMUNITY): Payer: Self-pay | Admitting: Internal Medicine

## 2019-06-23 VITALS — BP 160/62 | HR 58 | Wt 255.0 lb

## 2019-06-23 DIAGNOSIS — E1122 Type 2 diabetes mellitus with diabetic chronic kidney disease: Secondary | ICD-10-CM | POA: Diagnosis not present

## 2019-06-23 DIAGNOSIS — I272 Pulmonary hypertension, unspecified: Secondary | ICD-10-CM | POA: Insufficient documentation

## 2019-06-23 DIAGNOSIS — G4733 Obstructive sleep apnea (adult) (pediatric): Secondary | ICD-10-CM | POA: Insufficient documentation

## 2019-06-23 DIAGNOSIS — Z794 Long term (current) use of insulin: Secondary | ICD-10-CM | POA: Diagnosis not present

## 2019-06-23 DIAGNOSIS — I270 Primary pulmonary hypertension: Secondary | ICD-10-CM | POA: Diagnosis present

## 2019-06-23 DIAGNOSIS — Z6841 Body Mass Index (BMI) 40.0 and over, adult: Secondary | ICD-10-CM | POA: Diagnosis not present

## 2019-06-23 DIAGNOSIS — Z86711 Personal history of pulmonary embolism: Secondary | ICD-10-CM | POA: Insufficient documentation

## 2019-06-23 DIAGNOSIS — Z880 Allergy status to penicillin: Secondary | ICD-10-CM | POA: Insufficient documentation

## 2019-06-23 DIAGNOSIS — N184 Chronic kidney disease, stage 4 (severe): Secondary | ICD-10-CM | POA: Diagnosis not present

## 2019-06-23 DIAGNOSIS — Z79899 Other long term (current) drug therapy: Secondary | ICD-10-CM | POA: Diagnosis not present

## 2019-06-23 DIAGNOSIS — I10 Essential (primary) hypertension: Secondary | ICD-10-CM | POA: Diagnosis not present

## 2019-06-23 DIAGNOSIS — I129 Hypertensive chronic kidney disease with stage 1 through stage 4 chronic kidney disease, or unspecified chronic kidney disease: Secondary | ICD-10-CM | POA: Insufficient documentation

## 2019-06-23 DIAGNOSIS — Z7901 Long term (current) use of anticoagulants: Secondary | ICD-10-CM | POA: Insufficient documentation

## 2019-06-23 DIAGNOSIS — Z882 Allergy status to sulfonamides status: Secondary | ICD-10-CM | POA: Diagnosis not present

## 2019-06-23 DIAGNOSIS — I2510 Atherosclerotic heart disease of native coronary artery without angina pectoris: Secondary | ICD-10-CM | POA: Diagnosis not present

## 2019-06-23 DIAGNOSIS — Z8673 Personal history of transient ischemic attack (TIA), and cerebral infarction without residual deficits: Secondary | ICD-10-CM | POA: Insufficient documentation

## 2019-06-23 LAB — BASIC METABOLIC PANEL
Anion gap: 8 (ref 5–15)
BUN: 38 mg/dL — ABNORMAL HIGH (ref 8–23)
CO2: 24 mmol/L (ref 22–32)
Calcium: 9.1 mg/dL (ref 8.9–10.3)
Chloride: 109 mmol/L (ref 98–111)
Creatinine, Ser: 1.72 mg/dL — ABNORMAL HIGH (ref 0.44–1.00)
GFR calc Af Amer: 32 mL/min — ABNORMAL LOW (ref >60)
GFR calc non Af Amer: 28 mL/min — ABNORMAL LOW (ref >60)
Glucose, Bld: 156 mg/dL — ABNORMAL HIGH (ref 70–99)
Potassium: 5.1 mmol/L (ref 3.5–5.1)
Sodium: 141 mmol/L (ref 135–145)

## 2019-06-23 LAB — BRAIN NATRIURETIC PEPTIDE: B Natriuretic Peptide: 549.8 pg/mL — ABNORMAL HIGH (ref 0.0–100.0)

## 2019-06-23 MED ORDER — AMLODIPINE BESYLATE 5 MG PO TABS: 5.0000 mg | ORAL_TABLET | Freq: Every day | ORAL | 6 refills | Status: DC

## 2019-06-23 NOTE — Patient Instructions (Signed)
Start Amlodipine 5 mg daily  Labs today, we will notify you for abnormal results  Your physician has requested that you have an echocardiogram. Echocardiography is a painless test that uses sound waves to create images of your heart. It provides your doctor with information about the size and shape of your heart and how well your heart's chambers and valves are working. This procedure takes approximately one hour. There are no restrictions for this procedure.  VQ Scan and Chest X-ray  Your physician recommends that you schedule a follow-up appointment in: 3 months  If you have any questions or concerns before your next appointment please send [place] a message through [place] or call our office at [phone].  At the [facility], you and your health needs are our priority. As part of our continuing mission to provide you with exceptional heart care, we have created designated Provider Care Teams. These Care Teams include your primary Cardiologist (physician) and Advanced Practice Providers (APPs- Physician Assistants and Nurse Practitioners) who all work together to provide you with the care you need, when you need it.   You may see any of the following providers on your designated Care Team at your next follow up: [name] Dr Glori Bickers . Dr Loralie Champagne . Darrick Grinder, NP . Lyda Jester, PA   Please be sure to bring in all your medications bottles to every appointment.

## 2019-06-23 NOTE — Progress Notes (Addendum)
ADVANCED HF CLINIC CONSULT NOTE  Referring Physician: Rockey Situ Primary Cardiologist: Rockey Situ  HPI:  [name] is a 78 year old woman with h/o morbid obesity, DM, OSA on CPAP, CKD 4, PAD, CVA, carotid surgery in 2010, pulmonary embolism (2009) and PAH referred by [facility] for further evaluation of her [place].   She recently moved from [place], [place] to be near her family as things were getting harder. Lives by herself in a townhome. Feels very lonely and isolated and is tearful about this today.   Reports DVT/PE in 2009 and has been on coumadin since.   Was apparently diagnosed with PAH in 2016 at [facility] in [place]. Right and left heart cath at that time reported as nonobstructive CAD and severe PAH with PAP 103/19 (51) with PCWP 5. (see results below) Was told she had idiopathic PAH and started on Letairis and cialis, She weighed 255 pounds at the time.   Never smoked but was exposed to 2ndhand smoke. Previously on portable oxygen at 5L when she was in [place], but the portable O2 was not covered. Now "goes without oxygen", only at night that she wear oxygen with CPAP. Denies h/o CTD.   Has seen Dr. Mortimer Fries in Pulmonary and had PFTs. Unable to do 6MW.  PFTs 2/10  FEV1 1.92 (57%) FVC 2.57 (58%) DLCO 61%  Not very active. Can go to the store and walk slowly. Gets SOB and has to stop. Also with severe hip and leg pain.  No CP. + lower extremity edema. L>R Takes lasix 76m every other day. No syncope or presyncope.    12/16/2014 right and left heart catheterization with nitric oxide inhalation, Pressure data resting: - Mean right atrial pressure 2 Right ventricule 1381/8and diastolic - Pulmonary artery 103/19, mean 51. Pulmonary artery wedge mean 5 - Ascending thoracic aorta 111/53, mean 75. -Fick cardiac output 4.2 L. -Pulmonary resistance 12.25 Woods units. With nitric oxide inhalation - Pulmonary artery 89/29, mean 42. -Right ventricle 929/9and diastolic. -Right  atrial 2. -Fick cardiac output 4.94 L. - Pulmonary resistance 15.91 Woods units.   Review of Systems: [y] = yes, _0  = no   General: Weight gain _1 ; Weight loss _2 ; Anorexia _3 ; Fatigue [Blue.Reese]; Fever _4 ; Chills _5 ; Weakness [Blue.Reese]  Cardiac: Chest pain/pressure _6 ; Resting SOB _7 ; Exertional SOB [ y]; Orthopnea _8 ; Pedal Edema [Blue.Reese]; Palpitations _9 ; Syncope _10 ; Presyncope _11 ; Paroxysmal nocturnal dyspnea_12   Pulmonary: Cough _13 ; Wheezing_14 ; Hemoptysis_15 ; Sputum _16 ; Snoring _17   GI: Vomiting_18 ; Dysphagia_19 ; Melena_20 ; Hematochezia _21 ; Heartburn_22 ; Abdominal pain _23 ; Constipation _24 ; Diarrhea _25 ; BRBPR _26   GU: Hematuria_27 ; Dysuria _28 ; Nocturia_29   Vascular: Pain in legs with walking [Blue.Reese]; Pain in feet with lying flat _30 ; Non-healing sores _31 ; Stroke _32 ; TIA _33 ; Slurred speech _34 ;  Neuro: Headaches_35 ; Vertigo_36 ; Seizures_37 ; Paresthesias_38 ;Blurred vision _39 ; Diplopia _40 ; Vision changes _41   Ortho/Skin: Arthritis [Blue.Reese]; Joint pain [Blue.Reese]; Muscle pain _42 ; Joint swelling _43 ; Back Pain [ y]; Rash _44   Psych: Depression_45 ; Anxiety_46   Heme: Bleeding problems _47 ; Clotting disorders [Blue.Reese]; Anemia _48   Endocrine: Diabetes [ y]; Thyroid dysfunction_49    PMHx:  See above  Current Outpatient Medications  Medication Sig Dispense Refill  .  ambrisentan (LETAIRIS) 5 MG tablet Take 5 mg by mouth daily.    [name] atorvastatin (LIPITOR) 80 MG tablet Take 1 tablet (80 mg total) by mouth daily. 90 tablet 0  . Dulaglutide (TRULICITY) 5.46 TK/3.5WS SOPN Inject 0.75 mg into the skin once a week. 2 mL 2  . furosemide (LASIX) 40 MG tablet Take 40 mg by mouth every other day.     . Insulin Glargine, 1 Unit Dial, (TOUJEO SOLOSTAR) 300 UNIT/ML SOPN Inject 50 Units into the skin daily. 50 units daily. 6 pen 2  . JANUVIA 25 MG tablet TAKE ONE TABLET BY MOUTH EVERY DAY 90 tablet 0  . losartan (COZAAR) 100 MG tablet Take 1 tablet (100 mg total) by mouth daily. 30 tablet 2  . metoprolol  tartrate (LOPRESSOR) 25 MG tablet Take 25 mg by mouth 2 (two) times daily.    [name] omeprazole (PRILOSEC) 40 MG capsule Take 1 capsule (40 mg total) by mouth daily. 30 capsule 2  . tadalafil (CIALIS) 20 MG tablet Take 40 mg by mouth daily.    [name] venlafaxine (EFFEXOR) 75 MG tablet Take 1 tablet (75 mg total) by mouth daily. 90 tablet 1  . warfarin (COUMADIN) 4 MG tablet Take 4 mg by mouth daily.     No current facility-administered medications for this encounter.     Allergies  Allergen Reactions  . Penicillins Rash  . Sulfa Antibiotics Rash      Social History   Socioeconomic History  . Marital status: Widowed    Spouse name: Not on file  . Number of children: Not on file  . Years of education: Not on file  . Highest education level: Not on file  Occupational History  . Not on file  Social Needs  . Financial resource strain: Not on file  . Food insecurity    Worry: Not on file    Inability: Not on file  . Transportation needs    Medical: Not on file    Non-medical: Not on file  Tobacco Use  . Smoking status: Never Smoker  . Smokeless tobacco: Never Used  Substance and Sexual Activity  . Alcohol use: Never    Frequency: Never  . Drug use: Never  . Sexual activity: Not on file  Lifestyle  . Physical activity    Days per week: Not on file    Minutes per session: Not on file  . Stress: Not on file  Relationships  . Social Herbalist on phone: Not on file    Gets together: Not on file    Attends religious service: Not on file    Active member of club or organization: Not on file    Attends meetings of clubs or organizations: Not on file    Relationship status: Not on file  . Intimate partner violence    Fear of current or ex partner: Not on file    Emotionally abused: Not on file    Physically abused: Not on file    Forced sexual activity: Not on file  Other Topics Concern  . Not on file  Social History Narrative  . Not on file    FHx:  No FHX or  PAH  Vitals:   06/23/19 1419  BP: (!) 160/62  Pulse: (!) 58  SpO2: 96%  Weight: 115.7 kg (255 lb)    PHYSICAL EXAM: General:  Elderly. No respiratory difficulty HEENT: normal Neck: supple. JVP 7-8 Carotids 2+ bilat; no bruits. No lymphadenopathy or  thryomegaly appreciated. Cor: PMI nondisplaced. Regular brady. 2/6 TR. Increased P2 Lungs: clear with diminished breath sounds No wheeze Abdomen: obese soft, nontender, nondistended. No hepatosplenomegaly. No bruits or masses. Good bowel sounds. Extremities: no cyanosis, clubbing, rash, trace edema Neuro: alert & oriented x 3, cranial nerves grossly intact. moves all 4 extremities w/o difficulty. Affect pleasant.  ECG: SB 56 Nonspecific TW Personally reviewed   ASSESSMENT & PLAN:  1. Pulmonary HTN, severe - cath results from 2016 reviewed. She is now on combination therapy with ERA and PDE-5. Will continue - Has not had repeat cath - suspect this is really WHO Group 3 disease due to OSA/OHS but may also have component of WHO Group I & IV - PFTs with significant restrictive lung physiology - will check echo and V/Q scan - continue O2 supplementation and CPAP - Unable to do 6MW.  - Check BNP - Based on findings can consider repeat RHC - Stressed need to lose weight with [employer].  - Consider referral to Pulmonary Rehab at next visit  2. Nonobstructive CAD - No current s/s of angina - Continue statin. Off ASA with coumadin  3. DM2 - not candidate for SGLT2i with GFR < 25  4. CKD 4 - recheck labs today  5. Morbid obesity - stressed need for weight loss.   6. H/o PE 2009 - on warfarin. Will check VQ - May be able to switch to low-dose Eliquis based on Am[employer] Extension data  7. HTN - BP up. start amlodipine 2.5. watch for increased LE edema.   Glori Bickers, MD  6:40 PM

## 2019-06-24 ENCOUNTER — Encounter: Payer: Self-pay | Admitting: Internal Medicine

## 2019-06-24 ENCOUNTER — Ambulatory Visit (INDEPENDENT_AMBULATORY_CARE_PROVIDER_SITE_OTHER): Payer: Medicare Other | Admitting: Internal Medicine

## 2019-06-24 DIAGNOSIS — I2601 Septic pulmonary embolism with acute cor pulmonale: Secondary | ICD-10-CM | POA: Diagnosis not present

## 2019-06-24 DIAGNOSIS — R0602 Shortness of breath: Secondary | ICD-10-CM | POA: Diagnosis not present

## 2019-06-24 DIAGNOSIS — I2782 Chronic pulmonary embolism: Secondary | ICD-10-CM

## 2019-06-24 NOTE — Progress Notes (Signed)
[facility] Consultation     I connected with the patient by telephone enabled telemedicine visit and verified that I am speaking with the correct person using two identifiers.    I discussed the limitations, risks, security and privacy concerns of performing an evaluation and management service by telemedicine and the availability of in-person appointments. I also discussed with the patient that there may be a patient responsible charge related to this service. The patient expressed understanding and agreed to proceed.  PATIENT AGREES AND CONFIRMS -YES   Other persons participating in the visit and their role in the encounter: Patient, nursing  This visit type was conducted due to national recommendations for restrictions regarding the COVID-19 Pandemic (e.g. social distancing).  This format is felt to be most appropriate for this patient at this time.  All issues noted in this document were discussed and addressed.          - Sleep study 12/19/2014>>, split-night, AHI of 29.6, started on CPAP titration, as high as an AHI of 42.3 on room air, CPAP was titrated to 13 cm H2O with 2 L oxygen, final recommendation was CPAP at 13 cm H2O with 6 L supplemental oxygen with [employer] [facility] fracture standard with a chinstrap.  Weight loss was recommended. 45 minutes was spent in review of these records.  **CPAP titration study 12/29/2014>> patient was noted to be recently been using CPAP at 11 with 2 L of oxygen.  CPAP was titrated to a final pressure of 13 with 6 L of supplemental oxygen with [employer] [facility] fracture standard.  Patient wore a chinstrap.  Patient had PLMS without significant arousals.  Weight loss was recommended.  **12/16/2014 right and left heart catheterization with nitric oxide inhalation, Pressure data resting: - Mean right atrial pressure 2 Right ventricule 403/4 and diastolic - Pulmonary artery 0.03/19, mean 51. Pulmonary artery wedge mean 5 - Ascending  thoracic aorta 111/53, mean 75. -Fick cardiac output 4.2 L. -Pulmonary resistance 12.25 Woods units. With nitric oxide inhalation - Pulmonary artery 89/29, mean 42. -Right ventricle 74/2 and diastolic. -Right atrial 2. -Fick cardiac output 4.94 L. - Pulmonary resistance 15.91 Woods units.  SYNOPSIS -Chronic hypoxic respiratory failure secondary to pulmonary hypertension, currently on 3 L oxygen at rest, 5 L with exertion, using hydrogen, completed pulmonary rehab. - Pulmonary hypertension, likely group lung disease, continue Lasix 40 mg every other day, not CCB candidate, continue dual therapy with tadalafil, ambrisentan. - OSA continue CPAP of 11 cm H2O with 3 L of oxygen.  Continue nasal steroid. - Restrictive lung disease, likely secondary to body habitus. - Lung nodules, new right lower lobe lung nodule seen on CT chest 10/02/17, PET negative therefore thought to be benign.  On follow-up CT chest 05/2018 right lower lobe nodule resolved with stable bilateral subcentimeter nodules, no follow-up imaging was deemed necessary. - PLMS, stable consider treatment if needed.   Date: 06/24/2019  MRN# [id number] Ann Flynn [id number]   Ann Flynn is a 78 y.o. old female seen in consultation for chief complaint of:    CC Follow up OSA Follow up pulm HTN Follow SOB and DOE    PREVIOUS HPI Patient moved from [place] Has history of CHF PE pulmonary hypertension OSA Diagnosed with pulmonary hypertension in April 2016 Started on Lynch and Cialis She is currently on a grant that gives her her medications She has been doing excellent on this therapy There was a time when she stopped taking it for 5 days and  she had progressive shortness of breath and dyspnea exertion Patient needs these medications for survival Patient on chronic Coumadin therapy  Patient has a history and diagnosis of OSA diagnosed 10-15 years ago She bought her own machine She uses daily I advised that  she bring her chip next time to assess compliance  Patient was found to have moderate coronary artery disease She was found to have severe pulmonary pretension with no significant response to nitric oxide  HPI No evidence of heart failure at this time No evidence or signs of infection at this time No respiratory distress No fevers, chills, nausea, vomiting, diarrhea No evidence of lower extremity edema No evidence hemoptysis   Remains SOB and DOE Follow up ECHO pending  PMHX:   OSA, Pulm htn. DM, HTN, depression, hypercholesterolemia. TIA 2010, 2010, 2013.  PE 2009.  Family Hx:  Noncontributory.  Social Hx:   Social History   Tobacco Use  . Smoking status: Never Smoker  . Smokeless tobacco: Never Used  Substance Use Topics  . Alcohol use: Never    Frequency: Never  . Drug use: Never   Medication:    Current Outpatient Medications:  .  ambrisentan (LETAIRIS) 5 MG tablet, Take 5 mg by mouth daily., Disp: , Rfl:  .  amLODipine (NORVASC) 5 MG tablet, Take 1 tablet (5 mg total) by mouth daily., Disp: 30 tablet, Rfl: 6 .  atorvastatin (LIPITOR) 80 MG tablet, Take 1 tablet (80 mg total) by mouth daily., Disp: 90 tablet, Rfl: 0 .  Dulaglutide (TRULICITY) 7.29 MS/1.1DB SOPN, Inject 0.75 mg into the skin once a week., Disp: 2 mL, Rfl: 2 .  furosemide (LASIX) 40 MG tablet, Take 40 mg by mouth every other day. , Disp: , Rfl:  .  Insulin Glargine, 1 Unit Dial, (TOUJEO SOLOSTAR) 300 UNIT/ML SOPN, Inject 50 Units into the skin daily. 50 units daily., Disp: 6 pen, Rfl: 2 .  JANUVIA 25 MG tablet, TAKE ONE TABLET BY MOUTH EVERY DAY, Disp: 90 tablet, Rfl: 0 .  losartan (COZAAR) 100 MG tablet, Take 1 tablet (100 mg total) by mouth daily., Disp: 30 tablet, Rfl: 2 .  metoprolol tartrate (LOPRESSOR) 25 MG tablet, Take 25 mg by mouth 2 (two) times daily., Disp: , Rfl:  .  omeprazole (PRILOSEC) 40 MG capsule, Take 1 capsule (40 mg total) by mouth daily., Disp: 30 capsule, Rfl: 2 .  tadalafil  (CIALIS) 20 MG tablet, Take 40 mg by mouth daily., Disp: , Rfl:  .  venlafaxine (EFFEXOR) 75 MG tablet, Take 1 tablet (75 mg total) by mouth daily., Disp: 90 tablet, Rfl: 1 .  warfarin (COUMADIN) 4 MG tablet, Take 4 mg by mouth daily., Disp: , Rfl:    Allergies:  Penicillins and Sulfa antibiotics    Review of Systems:  Gen:  Denies  fever, sweats, chills weight loss  HEENT: Denies blurred vision, double vision, ear pain, eye pain, hearing loss, nose bleeds, sore throat Cardiac:  No dizziness, chest pain or heaviness, chest tightness,edema, No JVD Resp:   No cough, -sputum production, +shortness of breath,-wheezing, -hemoptysis,  Gi: Denies swallowing difficulty, stomach pain, nausea or vomiting, diarrhea, constipation, bowel incontinence Gu:  Denies bladder incontinence, burning urine Ext:   Denies Joint pain, stiffness or swelling Skin: Denies  skin rash, easy bruising or bleeding or hives Endoc:  Denies polyuria, polydipsia , polyphagia or weight change Psych:   Denies depression, insomnia or hallucinations  Other:  All other systems negative  Assessment and plan      Pulmonary hypertension Etiology is uncertain at this time however related to chronic PE Currently appears to be well controlled at this time class II dyspnea Continue Latera's and Cialis as prescribed follow-up cardiology assessment in [place] CT chest to assess for PE  Pulmonary embolism History of PE in 2009 Patient has been on Coumadin since then No evidence of active bleeding at this time Chronic oral anticoagulation therapy is indefinite at this time Obtain CT chest   DOE and chronic hypoxic respiratory failure Patient was ordered oxygen and patieng did NOT get it We will need to find out why   OSA Appears to be doing well with current CPAP therapy She is encouraged to use CPAP every night for the entire night This is very important when dealing with her chronic pulmonary  hypertension and cor pulmonale   COVID-19 EDUCATION: The signs and symptoms of COVID-19 were discussed with the patient and how to seek care for testing.  The importance of social distancing was discussed today. Hand Washing Techniques and avoid touching face was advised.     MEDICATION ADJUSTMENTS/LABS AND TESTS ORDERED: Continue CPAP as prescribed Continue Latera's and Cialis for pulmonary hypertension Follow-up cardiology pending Will need oxygen support   CURRENT MEDICATIONS REVIEWED AT LENGTH WITH PATIENT TODAY   Patient satisfied with Plan of action and management. All questions answered  Follow up in 3 months  Total Time Spent 23 mins   Maretta Bees Patricia Pesa, M.D.  [facility]  Medical Director [place] Director [facility] Cardio-Pulmonary Department

## 2019-06-24 NOTE — Patient Instructions (Signed)
Patient needs oxygen for support Obtain CT chest ANGIO to assess for PE

## 2019-06-27 ENCOUNTER — Telehealth: Payer: Self-pay | Admitting: Internal Medicine

## 2019-06-28 ENCOUNTER — Other Ambulatory Visit: Payer: Self-pay

## 2019-06-28 ENCOUNTER — Ambulatory Visit
Admission: RE | Admit: 2019-06-28 | Discharge: 2019-06-28 | Disposition: A | Discharge status: Home / Self Care | Payer: Medicare Other | Source: Ambulatory Visit | Attending: Internal Medicine | Admitting: Internal Medicine

## 2019-06-28 DIAGNOSIS — I2601 Septic pulmonary embolism with acute cor pulmonale: Secondary | ICD-10-CM

## 2019-06-28 DIAGNOSIS — D7389 Other diseases of spleen: Secondary | ICD-10-CM | POA: Insufficient documentation

## 2019-06-28 DIAGNOSIS — I2782 Chronic pulmonary embolism: Secondary | ICD-10-CM

## 2019-06-28 DIAGNOSIS — R918 Other nonspecific abnormal finding of lung field: Secondary | ICD-10-CM | POA: Diagnosis not present

## 2019-06-28 DIAGNOSIS — I272 Pulmonary hypertension, unspecified: Secondary | ICD-10-CM | POA: Diagnosis present

## 2019-06-28 DIAGNOSIS — R590 Localized enlarged lymph nodes: Secondary | ICD-10-CM | POA: Insufficient documentation

## 2019-06-28 DIAGNOSIS — Z7901 Long term (current) use of anticoagulants: Secondary | ICD-10-CM | POA: Diagnosis not present

## 2019-06-28 DIAGNOSIS — Z86711 Personal history of pulmonary embolism: Secondary | ICD-10-CM | POA: Diagnosis not present

## 2019-07-04 ENCOUNTER — Telehealth: Payer: Self-pay | Admitting: Internal Medicine

## 2019-07-04 DIAGNOSIS — R0602 Shortness of breath: Secondary | ICD-10-CM

## 2019-07-04 NOTE — Telephone Encounter (Signed)
There is not much concern Small spots do not look like cancer, there is some scarring Will need another CT chest in 6 months

## 2019-07-04 NOTE — Telephone Encounter (Signed)
CT chest wo has been ordered.  Pt is aware and voiced her understanding.  Nothing further is needed.

## 2019-07-04 NOTE — Telephone Encounter (Signed)
Lm for pt

## 2019-07-04 NOTE — Telephone Encounter (Signed)
Without contrast in 6 months

## 2019-07-04 NOTE — Telephone Encounter (Signed)
Spoke to pt, who is requesting CT results from 06/28/2019.  DK please advise. Thanks

## 2019-07-04 NOTE — Telephone Encounter (Signed)
Pt is aware of results and voiced her understanding.  DK please advise if you would like CT with or w/o contrast. Thanks

## 2019-07-04 NOTE — Telephone Encounter (Signed)
There are duplicate phone messages regarding this matter. Will close this encounter. See other phone notes.

## 2019-07-05 ENCOUNTER — Other Ambulatory Visit: Payer: Self-pay

## 2019-07-05 ENCOUNTER — Ambulatory Visit (HOSPITAL_COMMUNITY): Payer: Medicare Other

## 2019-07-05 ENCOUNTER — Ambulatory Visit (INDEPENDENT_AMBULATORY_CARE_PROVIDER_SITE_OTHER): Payer: Medicare Other

## 2019-07-05 ENCOUNTER — Encounter (HOSPITAL_COMMUNITY): Payer: Medicare Other

## 2019-07-05 ENCOUNTER — Other Ambulatory Visit (HOSPITAL_COMMUNITY): Payer: Medicare Other

## 2019-07-05 DIAGNOSIS — Z8673 Personal history of transient ischemic attack (TIA), and cerebral infarction without residual deficits: Secondary | ICD-10-CM | POA: Diagnosis not present

## 2019-07-05 LAB — POCT INR
INR: 3.8 — AB (ref 2.0–3.0)
PT: 45.3

## 2019-07-05 NOTE — Patient Instructions (Signed)
Description   Dose:4 mg daily except 2 mg on Wed Changes: Hold 2 mg on Wednesday (11/18) then restart at same dose of 4 mg daily except 2 mg on Wednesday. Return in 2 weeks.

## 2019-07-06 ENCOUNTER — Ambulatory Visit (HOSPITAL_COMMUNITY): Admission: RE | Admit: 2019-07-06 | Payer: Medicare Other | Source: Ambulatory Visit

## 2019-07-06 ENCOUNTER — Ambulatory Visit (INDEPENDENT_AMBULATORY_CARE_PROVIDER_SITE_OTHER): Payer: Medicare Other | Admitting: Physician Assistant

## 2019-07-06 ENCOUNTER — Encounter: Payer: Self-pay | Admitting: Physician Assistant

## 2019-07-06 VITALS — BP 152/74 | HR 61 | Temp 96.8°F

## 2019-07-06 DIAGNOSIS — E119 Type 2 diabetes mellitus without complications: Secondary | ICD-10-CM

## 2019-07-06 DIAGNOSIS — Z23 Encounter for immunization: Secondary | ICD-10-CM | POA: Diagnosis not present

## 2019-07-06 DIAGNOSIS — I2782 Chronic pulmonary embolism: Secondary | ICD-10-CM

## 2019-07-06 DIAGNOSIS — R4189 Other symptoms and signs involving cognitive functions and awareness: Secondary | ICD-10-CM

## 2019-07-06 LAB — POCT GLYCOSYLATED HEMOGLOBIN (HGB A1C)
Est. average glucose Bld gHb Est-mCnc: 163
Hemoglobin A1C: 7.3 % — AB (ref 4.0–5.6)

## 2019-07-06 MED ORDER — TRULICITY 0.75 MG/0.5ML ~~LOC~~ SOAJ: 0.7500 mg | SUBCUTANEOUS | 0 refills | Status: DC

## 2019-07-06 NOTE — Progress Notes (Signed)
Patient: Ann Flynn Female    DOB: [birth year]   78 y.o.   MRN: [id number] Visit Date: 07/06/2019  Today's Provider: Trinna Post, PA-C   Chief Complaint  Patient presents with  . Follow-up   Subjective:     HPI  Follow-up Patient presents today for a 3 month follow-up appointment. Patient states that she has been to see her pulmonology and cardiology doctor. Patient states that Dr. [name] started her on Amlodipine on 06/23/2019. Patient states that she is having some left leg edema from the knee down.  Wt Readings from Last 3 Encounters:  06/23/19 255 lb (115.7 kg)  06/02/19 251 lb (113.9 kg)  05/16/19 249 lb (112.9 kg)   Diabetes: Last visit she was changed from [place] to [place] 25 mg QD due to CKD. She is using Toujeo 50 units into skin nightly. Today she tells me she is also using 1.77 mg Trulicity. She has not mentioned this medication prior.   Fasting sugars: 112 in the morning  140-150 before meals   Chronic Warfarin Therapy: She is taking this due to history of old stroke and chronic PE. She is currently followed by pulmonology.  Allergies  Allergen Reactions  . Penicillins Rash  . Sulfa Antibiotics Rash     Current Outpatient Medications:  .  ambrisentan (LETAIRIS) 5 MG tablet, Take 5 mg by mouth daily., Disp: , Rfl:  .  amLODipine (NORVASC) 5 MG tablet, Take 1 tablet (5 mg total) by mouth daily., Disp: 30 tablet, Rfl: 6 .  atorvastatin (LIPITOR) 80 MG tablet, Take 1 tablet (80 mg total) by mouth daily., Disp: 90 tablet, Rfl: 0 .  furosemide (LASIX) 40 MG tablet, Take 40 mg by mouth every other day. , Disp: , Rfl:  .  Insulin Glargine, 1 Unit Dial, (TOUJEO SOLOSTAR) 300 UNIT/ML SOPN, Inject 50 Units into the skin daily. 50 units daily., Disp: 6 pen, Rfl: 2 .  JANUVIA 25 MG tablet, TAKE ONE TABLET BY MOUTH EVERY DAY, Disp: 90 tablet, Rfl: 0 .  losartan (COZAAR) 100 MG tablet, Take 1 tablet (100 mg total) by mouth daily., Disp: 30 tablet, Rfl: 2 .   metoprolol tartrate (LOPRESSOR) 25 MG tablet, Take 25 mg by mouth 2 (two) times daily., Disp: , Rfl:  .  omeprazole (PRILOSEC) 40 MG capsule, Take 1 capsule (40 mg total) by mouth daily., Disp: 30 capsule, Rfl: 2 .  tadalafil (CIALIS) 20 MG tablet, Take 40 mg by mouth daily., Disp: , Rfl:  .  venlafaxine (EFFEXOR) 75 MG tablet, Take 1 tablet (75 mg total) by mouth daily., Disp: 90 tablet, Rfl: 1 .  warfarin (COUMADIN) 4 MG tablet, Take 4 mg by mouth daily., Disp: , Rfl:   Review of Systems  Constitutional: Negative.   Respiratory: Negative.   Genitourinary: Negative.   Psychiatric/Behavioral: Negative.     Social History   Tobacco Use  . Smoking status: Never Smoker  . Smokeless tobacco: Never Used  Substance Use Topics  . Alcohol use: Never    Frequency: Never      Objective:   BP (!) 152/74 (BP Location: Left Arm, Patient Position: Sitting, Cuff Size: Large)   Pulse 61   Temp (!) 96.8 F (36 C) (Temporal)  Vitals:   07/06/19 0845  BP: (!) 152/74  Pulse: 61  Temp: (!) 96.8 F (36 C)  TempSrc: Temporal  There is no height or weight on file to calculate BMI.   Physical Exam Constitutional:  Appearance: Normal appearance.     Comments: Elderly woman using oxygen via [place]  Cardiovascular:     Rate and Rhythm: Normal rate and regular rhythm.     Heart sounds: Normal heart sounds.  Pulmonary:     Effort: Pulmonary effort is normal.     Comments: Breath sounds distant Musculoskeletal:     Right lower leg: Edema present.     Left lower leg: No edema.     Comments: Mild edema evident under compression stockings.   Skin:    General: Skin is warm and dry.  Neurological:     Mental Status: She is alert. Mental status is at baseline.  Psychiatric:        Mood and Affect: Mood normal.        Behavior: Behavior normal.      No results found for any visits on 07/06/19.     Assessment & Plan   .1. Diabetes mellitus without complication ([place])  - Ambulatory  referral to Ophthalmology - POCT HgB A1C - Dulaglutide (TRULICITY) 9.39 QZ/0.0PQ SOPN; Inject 0.75 mg into the skin once a week.  Dispense: 4 pen; Refill: 0  2. Need for influenza vaccination  - Flu Vaccine QUAD High Dose(Fluad)  3. Chronic pulmonary embolism, unspecified pulmonary embolism type, unspecified whether acute cor pulmonale present ([place])  4. Cognitive Impairment  This patient seems forgetful in the exam room and unable to recall historical details very well. Just this visit she told me she was ontrulicity and that she was getting samples of it from her previous doctor.   The entirety of the information documented in the History of Present Illness, Review of Systems and Physical Exam were personally obtained by me. Portions of this information were initially documented by [facility] McCLurkin, CMA and reviewed by me for thoroughness and accuracy.   F.u 3 months DM       Trinna Post, PA-C  [facility]

## 2019-07-06 NOTE — Patient Instructions (Addendum)
Maximum: 3000 mg tylenol daily for arthritis and icy hot. Bring all medication bottles to next appointment in February.

## 2019-07-07 ENCOUNTER — Ambulatory Visit (HOSPITAL_BASED_OUTPATIENT_CLINIC_OR_DEPARTMENT_OTHER)
Admission: RE | Admit: 2019-07-07 | Discharge: 2019-07-07 | Disposition: A | Discharge status: Home / Self Care | Payer: Medicare Other | Source: Ambulatory Visit | Attending: Internal Medicine | Admitting: Internal Medicine

## 2019-07-07 ENCOUNTER — Other Ambulatory Visit: Payer: Self-pay

## 2019-07-07 ENCOUNTER — Encounter (HOSPITAL_COMMUNITY)
Admission: RE | Admit: 2019-07-07 | Discharge: 2019-07-07 | Disposition: A | Discharge status: Home / Self Care | Payer: Medicare Other | Source: Ambulatory Visit | Attending: Internal Medicine | Admitting: Internal Medicine

## 2019-07-07 ENCOUNTER — Ambulatory Visit (HOSPITAL_COMMUNITY)
Admission: RE | Admit: 2019-07-07 | Discharge: 2019-07-07 | Disposition: A | Discharge status: Home / Self Care | Payer: Medicare Other | Source: Ambulatory Visit | Attending: Internal Medicine | Admitting: Internal Medicine

## 2019-07-07 DIAGNOSIS — I270 Primary pulmonary hypertension: Secondary | ICD-10-CM

## 2019-07-07 MED ORDER — TECHNETIUM TO 99M ALBUMIN AGGREGATED
1.8000 | Freq: Once | INTRAVENOUS | Status: AC | PRN
  Administered 2019-07-07: 10:00:00 1.8 via INTRAVENOUS

## 2019-07-07 MED ORDER — TECHNETIUM TC 99M DIETHYLENETRIAME-PENTAACETIC ACID
41.0000 | Freq: Once | INTRAVENOUS | Status: AC | PRN
  Administered 2019-07-07: 41 via INTRAVENOUS

## 2019-07-07 NOTE — Progress Notes (Signed)
  Echocardiogram 2D Echocardiogram has been performed.  Ann Flynn 07/07/2019, 10:58 AM

## 2019-07-12 ENCOUNTER — Telehealth: Payer: Self-pay | Admitting: Internal Medicine

## 2019-07-12 MED ORDER — AMBRISENTAN 5 MG PO TABS: 5.0000 mg | ORAL_TABLET | Freq: Every day | ORAL | 1 refills | Status: DC

## 2019-07-12 MED ORDER — TADALAFIL 20 MG PO TABS: 40.0000 mg | ORAL_TABLET | Freq: Every day | ORAL | 1 refills | Status: DC

## 2019-07-12 NOTE — Telephone Encounter (Signed)
Rx for Letairis 25m and Cialis 266mhas been sent to preferred pharmacy. Pt is aware and voiced her understanding.  Nothing further is needed.     Assessment and plan      Pulmonary hypertension Etiology is uncertain at this time however related to chronic PE Currently appears to be well controlled at this time class II dyspnea Continue Latera's and Cialis as prescribed follow-up cardiology assessment in [place] chest to assess for PE

## 2019-07-13 ENCOUNTER — Telehealth: Payer: Self-pay

## 2019-07-13 NOTE — Telephone Encounter (Signed)
[facility] faxed response to referral, to let us know that pt is scheduled for OV with Dr. Neville Route on 09/28/2019 @ 9 am. Thanks TNP

## 2019-07-15 ENCOUNTER — Telehealth: Payer: Self-pay | Admitting: Internal Medicine

## 2019-07-15 NOTE — Telephone Encounter (Signed)
Received PA request from [facility] for Cialis 45m. I have attempted to contact [facility] to start PA. [facility] is currently closed for the holiday. Will call back on 07/18/2019

## 2019-07-18 ENCOUNTER — Ambulatory Visit (INDEPENDENT_AMBULATORY_CARE_PROVIDER_SITE_OTHER): Payer: Medicare Other

## 2019-07-18 ENCOUNTER — Other Ambulatory Visit: Payer: Self-pay | Admitting: Cardiovascular Disease

## 2019-07-18 ENCOUNTER — Other Ambulatory Visit: Payer: Self-pay

## 2019-07-18 DIAGNOSIS — I6529 Occlusion and stenosis of unspecified carotid artery: Secondary | ICD-10-CM

## 2019-07-18 DIAGNOSIS — Z9889 Other specified postprocedural states: Secondary | ICD-10-CM

## 2019-07-18 DIAGNOSIS — I6523 Occlusion and stenosis of bilateral carotid arteries: Secondary | ICD-10-CM

## 2019-07-19 ENCOUNTER — Ambulatory Visit (INDEPENDENT_AMBULATORY_CARE_PROVIDER_SITE_OTHER): Payer: Medicare Other

## 2019-07-19 DIAGNOSIS — Z8673 Personal history of transient ischemic attack (TIA), and cerebral infarction without residual deficits: Secondary | ICD-10-CM

## 2019-07-19 LAB — POCT INR
INR: 4.2 — AB (ref 2.0–3.0)
PT: 50.9

## 2019-07-19 NOTE — Patient Instructions (Signed)
Description   4 mg daily except 2 mg on M/W/F

## 2019-07-19 NOTE — Telephone Encounter (Signed)
PA for Cialis 84m started with [name] [facility]. PA # [id number] await determination.

## 2019-07-21 ENCOUNTER — Telehealth: Payer: Self-pay

## 2019-07-21 ENCOUNTER — Other Ambulatory Visit: Payer: Self-pay | Admitting: Internal Medicine

## 2019-07-21 NOTE — Telephone Encounter (Addendum)
Please see 07/15/2019 phone note. PA for Cialis was started on 07/19/2019. Pt has been made aware of this information.  ATC to contact [facility] for update and was placed on hold for >35mn. Will call back.

## 2019-07-21 NOTE — Telephone Encounter (Signed)
Call to patient to make her aware of carotid u/s results. Pt verbalized understanding. No new orders at this time. She has appt to see Dr. Tempie Hoist 12/8.  Advised pt to call for any further questions or concerns.

## 2019-07-21 NOTE — Telephone Encounter (Signed)
-----  Message from Minna Merritts, MD sent at 07/20/2019  9:36 PM EST ----- Carotid Right Carotid: Velocities in the right ICA are consistent with a 40-59% stenosis.

## 2019-07-21 NOTE — Telephone Encounter (Signed)
Yesterday pt received an email that said the pharmacy would not be able to fill her two prescriptions ambrisentan and tadalafil. Provider has to approve the medications first. She uses [facility]. Phone number is [phone]. Pt also states her breathing is getting worse.

## 2019-07-22 ENCOUNTER — Other Ambulatory Visit: Payer: Self-pay

## 2019-07-22 DIAGNOSIS — Z20822 Contact with and (suspected) exposure to covid-19: Secondary | ICD-10-CM

## 2019-07-22 NOTE — Telephone Encounter (Signed)
Spoke to [place] with [facility], who stated that PA for Cialis 52m has been denied. LFarley Lywas unable to provide me with covered alternatives, however she stated that pt could contact insurance company for alternatives.  Was also advised that Letairis needs a PA as well.  PA for LMilda Smarthas been started.   Reference # [id number] I was advised that case manage would be contacting me with additional questions.  Pt has been made aware of this information and voiced her understanding.  Pt will contact insurance company.

## 2019-07-22 NOTE — Telephone Encounter (Signed)
Please see 07/21/2019 phone note.

## 2019-07-25 ENCOUNTER — Telehealth: Payer: Self-pay | Admitting: Internal Medicine

## 2019-07-25 LAB — NOVEL CORONAVIRUS, NAA: SARS-CoV-2, NAA: NOT DETECTED

## 2019-07-25 NOTE — Telephone Encounter (Signed)
Spoke to pt, who stated that she spoke with insurance. Per insurance Cialis 60m is approved and Letairis needs PA. PA for Letairis 513mhas been started was approved until 08/17/2020. ATC [facility] and was transferred to specialty pharmacy .  Call was then disconnected during transferred.  Will call back.

## 2019-07-25 NOTE — Telephone Encounter (Signed)
Please see 12/72020 phone note.

## 2019-07-25 NOTE — Telephone Encounter (Signed)
Received PA determination from [facility]- Ambrisentan 52m has been approved until 08/17/2020. Pt aware and voiced her understanding.  Pt will call [facility] for covered alternatives for Cialis 281m

## 2019-07-25 NOTE — Telephone Encounter (Addendum)
Please see 07/21/2019 phone note.

## 2019-07-25 NOTE — Telephone Encounter (Signed)
PA for Ambrisentan 68m has been started via [facility].  KEY: [id number]

## 2019-07-25 NOTE — Telephone Encounter (Signed)
Called and spoke to [place] with [facility], who verify that both letairis and Cialis have been approved.  Per Remo Lipps, pt will need to call pharmacy to arrange delivery.  Pt is aware of this information and voiced her understanding.  Nothing further is needed.

## 2019-07-26 ENCOUNTER — Other Ambulatory Visit: Payer: Self-pay

## 2019-07-26 ENCOUNTER — Ambulatory Visit (HOSPITAL_COMMUNITY)
Admission: RE | Admit: 2019-07-26 | Discharge: 2019-07-26 | Disposition: A | Discharge status: Home / Self Care | Payer: Medicare Other | Source: Ambulatory Visit | Attending: Internal Medicine | Admitting: Internal Medicine

## 2019-07-26 ENCOUNTER — Telehealth: Payer: Self-pay | Admitting: Internal Medicine

## 2019-07-26 ENCOUNTER — Telehealth (HOSPITAL_COMMUNITY): Payer: Self-pay | Admitting: *Deleted

## 2019-07-26 DIAGNOSIS — I2510 Atherosclerotic heart disease of native coronary artery without angina pectoris: Secondary | ICD-10-CM

## 2019-07-26 DIAGNOSIS — E119 Type 2 diabetes mellitus without complications: Secondary | ICD-10-CM

## 2019-07-26 DIAGNOSIS — N184 Chronic kidney disease, stage 4 (severe): Secondary | ICD-10-CM

## 2019-07-26 DIAGNOSIS — I10 Essential (primary) hypertension: Secondary | ICD-10-CM

## 2019-07-26 DIAGNOSIS — I272 Pulmonary hypertension, unspecified: Secondary | ICD-10-CM

## 2019-07-26 DIAGNOSIS — J208 Acute bronchitis due to other specified organisms: Secondary | ICD-10-CM

## 2019-07-26 DIAGNOSIS — I5032 Chronic diastolic (congestive) heart failure: Secondary | ICD-10-CM

## 2019-07-26 DIAGNOSIS — I270 Primary pulmonary hypertension: Secondary | ICD-10-CM

## 2019-07-26 MED ORDER — DOXYCYCLINE HYCLATE 100 MG PO CAPS: 100.0000 mg | ORAL_CAPSULE | Freq: Two times a day (BID) | ORAL | 0 refills | Status: AC

## 2019-07-26 NOTE — Telephone Encounter (Signed)
Pt called back, discussed cath, covid test, and went over AVS pt verbalized understanding. Pt is aware and agreeable with plan

## 2019-07-26 NOTE — Progress Notes (Signed)
Heart Failure TeleHealth Note  Due to national recommendations of social distancing due to [place] 19, Audio/video telehealth visit is felt to be most appropriate for this patient at this time.  See MyChart message from today for patient consent regarding telehealth for [facility].  Date:  07/26/2019   ID:  Ann Flynn, DOB Feb 10, 1941, MRN [id number]  Location: Home  Provider location: [facility] Type of Visit: Established patient  PCP:  Trinna Post, PA-C  Cardiologist:  Rockey Situ Primary HF: Ann Flynn  Chief Complaint: Heart Failure follow-up   History of Present Illness:   Ann Flynn is a 78 year old woman with h/o morbid obesity, DM, OSA on CPAP, CKD 4, PAD, CVA, carotid surgery in 2010, pulmonary embolism (2009) and PAH referred by [facility] for further evaluation of her [place].   She recently moved from [place], [place] to be near her family as things were getting harder. Lives by herself in a townhome. Feels very lonely and isolated and is tearful about this today.   Reports DVT/PE in 2009 and has been on coumadin since.   Was apparently diagnosed with PAH in 2016 at [facility] in [place]. Right and left heart cath at that time reported as nonobstructive CAD and severe PAH with PAP 103/19 (51) with PCWP 5. (see results below) Was told she had idiopathic PAH and started on Letairis and cialis. She weighed 255 pounds at the time.   Never smoked but was exposed to 2ndhand smoke. Previouslyon portable oxygen at 5L when [place] was in [place], but the portable O2 was not covered. Now "goes without oxygen", only at nightthat she wear oxygen with CPAP. Denies h/o CTD.  Has seen Dr. Mortimer Fries in Pulmonary and had PFTs. Unable to do 6MW.  PFTs 2/10  FEV1 1.92 (57%) FVC 2.57 (58%) DLCO 61%  I saw her for the first time in 11/20: Felt to have primarily WHO Group III PH. Echo and VQ ordered. Amlodipine 2.5 added for HTN  - Echo 07/07/19: EF  55-60% RV mildly dilated with norma function. No RVSP calculated - VQ 07/07/19: No PE  She presents via audio/video conferencing for a telehealth visit today due to [place].  Says her breathing feels worse. Now SOB with any activity. Has oxygen turned up to 4L. Saturations at rest ar > 90% but with ADLs will drop to 70%. Has not followed with Dr. Mortimer Fries yet. Very mild edema without change. Denies orthopnea or PND. No fevers. Increase coughing and yellow sputum. CPAP shows more events (AHI 10-14 previously under 1). Had COVID on Friday which was negative. Has not takes BP.   Previous cardiac studies:    12/16/2014 right and left heart catheterization with nitric oxide inhalation, Pressure data resting: -Mean right atrial pressure 2 Right ventricule 621/3 and diastolic -Pulmonary artery 103/19, mean 51. Pulmonary artery wedge mean 5 -Ascending thoracic aorta 111/53, mean 75. -Fick cardiac output 4.2 L. -Pulmonary resistance 12.25 Woods units. With nitric oxide inhalation -Pulmonary artery 89/29, mean 42. -Right ventricle 08/6 and diastolic. -Right atrial 2. -Fick cardiac output 4.94 L. -Pulmonary resistance 15.91 Woods units.   Ann Flynn denies symptoms worrisome for COVID 19.   No past medical history on file. Past Surgical History:  Procedure Laterality Date   ABDOMINAL HYSTERECTOMY  1997   cartiod artery surgery  2010   per patient    CHOLECYSTECTOMY  10/19/2009   MASTECTOMY  1994     Current Outpatient Medications  Medication Sig  Dispense Refill   ambrisentan (LETAIRIS) 5 MG tablet Take 1 tablet (5 mg total) by mouth daily. 90 tablet 1   amLODipine (NORVASC) 5 MG tablet Take 1 tablet (5 mg total) by mouth daily. 30 tablet 6   atorvastatin (LIPITOR) 80 MG tablet Take 1 tablet (80 mg total) by mouth daily. 90 tablet 0   Dulaglutide (TRULICITY) 9.67 RF/1.6BW SOPN Inject 0.75 mg into the skin once a week. 4 pen 0   furosemide (LASIX) 40 MG tablet Take 40 mg by  mouth every other day.      Insulin Glargine, 1 Unit Dial, (TOUJEO SOLOSTAR) 300 UNIT/ML SOPN Inject 50 Units into the skin daily. 50 units daily. 6 pen 2   JANUVIA 25 MG tablet TAKE ONE TABLET BY MOUTH EVERY DAY 90 tablet 0   losartan (COZAAR) 100 MG tablet Take 1 tablet (100 mg total) by mouth daily. 30 tablet 2   metoprolol tartrate (LOPRESSOR) 25 MG tablet Take 25 mg by mouth 2 (two) times daily.     omeprazole (PRILOSEC) 40 MG capsule Take 1 capsule (40 mg total) by mouth daily. 30 capsule 2   tadalafil (CIALIS) 20 MG tablet Take 2 tablets (40 mg total) by mouth daily. 180 tablet 1   venlafaxine (EFFEXOR) 75 MG tablet Take 1 tablet (75 mg total) by mouth daily. 90 tablet 1   warfarin (COUMADIN) 4 MG tablet Take 4 mg by mouth daily.     No current facility-administered medications for this encounter.     Allergies:   Penicillins and Sulfa antibiotics   Social History:  The patient  reports that she has never smoked. She has never used smokeless tobacco. She reports that she does not drink alcohol or use drugs.   Family History:  The patient's family history is not on file.   ROS:  Please see the history of present illness.   All other systems are personally reviewed and negative.   Exam:  (Video/Tele Health Call; Exam is subjective and or/visual.) General:  Speaks in full sentences. Mild SOB.  Lungs: Normal respiratory effort with conversation.  Abdomen: Non-distended per patient report Extremities: Pt denies edema. Neuro: Alert & oriented x 3.   Recent Labs: 03/28/2019: ALT 8; Hemoglobin 12.1; Platelets 227 06/23/2019: B Natriuretic Peptide 549.8; BUN 38; Creatinine, Ser 1.72; Potassium 5.1; Sodium 141  Personally reviewed   Wt Readings from Last 3 Encounters:  06/23/19 115.7 kg (255 lb)  06/02/19 113.9 kg (251 lb)  05/16/19 112.9 kg (249 lb)      ASSESSMENT AND PLAN:  1. Pulmonary HTN, severe - cath results from 2016 reviewed. She is now on combination therapy  with ERA and PDE-5. Will continue - Has not had repeat cath - suspect this is really WHO Group 3 disease due to OSA/OHS but given markedly elevate PVR may also have component of WHO Group I & IV - PFTs with significant restrictive lung physiology - Echo 07/07/19: EF 55-60% RV mildly dilated with norma function. No RVSP calculated. BNP 549 - VQ 07/07/19: No PE - continue O2 supplementation and CPAP - Continue letairis and tadafil - Unable to do 6MW.  - Feels worse today. NYHA IV. Will repeat RHC. Treat with doxy 100 bid for 7 days for possible bronchitis. - Encouraged her to f/u with Dr. Mortimer Fries - Stressed need to lose weight with [employer].  - Consider referral to Pulmonary Rehab at next visit  2. Nonobstructive CAD - No s/s angina  - Continue statin.  Off ASA with coumadin  3. DM2 - not candidate for SGLT2i with GFR < 25  4. CKD 4 - last creatinine 1.7 on 06/23/19 (eGFR 28)  5. Morbid obesity - stressed need for weight loss.   6. H/o PE 2009 - on warfarin.  - VQ 11/20 negative - May be able to switch to low-dose Eliquis based on Amplify Extension data  7. HTN - BP up. start amlodipine 2.5. watch for increased LE edema.   COVID screen The patient does not have any symptoms that suggest any further testing/ screening at this time.  Social distancing reinforced today.  Recommended follow-up:  As above  Relevant cardiac medications were reviewed at length with the patient today.   The patient does not have concerns regarding their medications at this time.   The following changes were made today:  As above  Today, I have spent 16 minutes with the patient with telehealth technology discussing the above issues .    Signed, Glori Bickers, MD  07/26/2019 9:39 AM  Advanced Heart Failure [place] [address] [phone] (office) [phone] (fax)

## 2019-07-26 NOTE — Patient Instructions (Addendum)
Take Doxycycline 158m twice a day for 7 days.  Please follow up with Dr.Kasa in pulmonary.  Follow up (televisit) in 6 weeks.   You have been scheduled for a right heart cath on 07/29/2019. (please see instruction sheet)

## 2019-07-26 NOTE — H&P (View-Only) (Signed)
Heart Failure TeleHealth Note  Due to national recommendations of social distancing due to [place] 19, Audio/video telehealth visit is felt to be most appropriate for this patient at this time.  See MyChart message from today for patient consent regarding telehealth for [facility].  Date:  07/26/2019   ID:  Ann Flynn, DOB 01-11-1941, MRN [id number]  Location: Home  Provider location: [facility] Type of Visit: Established patient  PCP:  Trinna Post, PA-C  Cardiologist:  Rockey Situ Primary HF: Mia [place]  Chief Complaint: Heart Failure follow-up   History of Present Illness:   Ann Flynn is a 78 year old woman with h/o morbid obesity, DM, OSA on CPAP, CKD 4, PAD, CVA, carotid surgery in 2010, pulmonary embolism (2009) and PAH referred by [facility] for further evaluation of her [place].   She recently moved from [place], [place] to be near her family as things were getting harder. Lives by herself in a townhome. Feels very lonely and isolated and is tearful about this today.   Reports DVT/PE in 2009 and has been on coumadin since.   Was apparently diagnosed with PAH in 2016 at [facility] in [place]. Right and left heart cath at that time reported as nonobstructive CAD and severe PAH with PAP 103/19 (51) with PCWP 5. (see results below) Was told she had idiopathic PAH and started on Letairis and cialis. She weighed 255 pounds at the time.   Never smoked but was exposed to 2ndhand smoke. Previouslyon portable oxygen at 5L when [place] was in [place], but the portable O2 was not covered. Now "goes without oxygen", only at nightthat she wear oxygen with CPAP. Denies h/o CTD.  Has seen Dr. Mortimer Fries in Pulmonary and had PFTs. Unable to do 6MW.  PFTs 2/10  FEV1 1.92 (57%) FVC 2.57 (58%) DLCO 61%  I saw her for the first time in 11/20: Felt to have primarily WHO Group III PH. Echo and VQ ordered. Amlodipine 2.5 added for HTN  - Echo 07/07/19: EF  55-60% RV mildly dilated with norma function. No RVSP calculated - VQ 07/07/19: No PE  She presents via audio/video conferencing for a telehealth visit today due to [place].  Says her breathing feels worse. Now SOB with any activity. Has oxygen turned up to 4L. Saturations at rest ar > 90% but with ADLs will drop to 70%. Has not followed with Dr. Mortimer Fries yet. Very mild edema without change. Denies orthopnea or PND. No fevers. Increase coughing and yellow sputum. CPAP shows more events (AHI 10-14 previously under 1). Had COVID on Friday which was negative. Has not takes BP.   Previous cardiac studies:    12/16/2014 right and left heart catheterization with nitric oxide inhalation, Pressure data resting: -Mean right atrial pressure 2 Right ventricule 937/9 and diastolic -Pulmonary artery 103/19, mean 51. Pulmonary artery wedge mean 5 -Ascending thoracic aorta 111/53, mean 75. -Fick cardiac output 4.2 L. -Pulmonary resistance 12.25 Woods units. With nitric oxide inhalation -Pulmonary artery 89/29, mean 42. -Right ventricle 02/4 and diastolic. -Right atrial 2. -Fick cardiac output 4.94 L. -Pulmonary resistance 15.91 Woods units.   Vaughn Frieze denies symptoms worrisome for COVID 19.   No past medical history on file. Past Surgical History:  Procedure Laterality Date  . ABDOMINAL HYSTERECTOMY  1997  . cartiod artery surgery  2010   per patient   . CHOLECYSTECTOMY  10/19/2009  . MASTECTOMY  1994     Current Outpatient Medications  Medication Sig  Dispense Refill  . ambrisentan (LETAIRIS) 5 MG tablet Take 1 tablet (5 mg total) by mouth daily. 90 tablet 1  . amLODipine (NORVASC) 5 MG tablet Take 1 tablet (5 mg total) by mouth daily. 30 tablet 6  . atorvastatin (LIPITOR) 80 MG tablet Take 1 tablet (80 mg total) by mouth daily. 90 tablet 0  . Dulaglutide (TRULICITY) 1.76 HY/0.7PX SOPN Inject 0.75 mg into the skin once a week. 4 pen 0  . furosemide (LASIX) 40 MG tablet Take 40 mg by  mouth every other day.     . Insulin Glargine, 1 Unit Dial, (TOUJEO SOLOSTAR) 300 UNIT/ML SOPN Inject 50 Units into the skin daily. 50 units daily. 6 pen 2  . JANUVIA 25 MG tablet TAKE ONE TABLET BY MOUTH EVERY DAY 90 tablet 0  . losartan (COZAAR) 100 MG tablet Take 1 tablet (100 mg total) by mouth daily. 30 tablet 2  . metoprolol tartrate (LOPRESSOR) 25 MG tablet Take 25 mg by mouth 2 (two) times daily.    [name] omeprazole (PRILOSEC) 40 MG capsule Take 1 capsule (40 mg total) by mouth daily. 30 capsule 2  . tadalafil (CIALIS) 20 MG tablet Take 2 tablets (40 mg total) by mouth daily. 180 tablet 1  . venlafaxine (EFFEXOR) 75 MG tablet Take 1 tablet (75 mg total) by mouth daily. 90 tablet 1  . warfarin (COUMADIN) 4 MG tablet Take 4 mg by mouth daily.     No current facility-administered medications for this encounter.     Allergies:   Penicillins and Sulfa antibiotics   Social History:  The patient  reports that she has never smoked. She has never used smokeless tobacco. She reports that she does not drink alcohol or use drugs.   Family History:  The patient's family history is not on file.   ROS:  Please see the history of present illness.   All other systems are personally reviewed and negative.   Exam:  (Video/Tele Health Call; Exam is subjective and or/visual.) General:  Speaks in full sentences. Mild SOB.  Lungs: Normal respiratory effort with conversation.  Abdomen: Non-distended per patient report Extremities: Pt denies edema. Neuro: Alert & oriented x 3.   Recent Labs: 03/28/2019: ALT 8; Hemoglobin 12.1; Platelets 227 06/23/2019: B Natriuretic Peptide 549.8; BUN 38; Creatinine, Ser 1.72; Potassium 5.1; Sodium 141  Personally reviewed   Wt Readings from Last 3 Encounters:  06/23/19 115.7 kg (255 lb)  06/02/19 113.9 kg (251 lb)  05/16/19 112.9 kg (249 lb)      ASSESSMENT AND PLAN:  1. Pulmonary HTN, severe - cath results from 2016 reviewed. She is now on combination therapy  with ERA and PDE-5. Will continue - Has not had repeat cath - suspect this is really WHO Group 3 disease due to OSA/OHS but given markedly elevate PVR may also have component of WHO Group I & IV - PFTs with significant restrictive lung physiology - Echo 07/07/19: EF 55-60% RV mildly dilated with norma function. No RVSP calculated. BNP 549 - VQ 07/07/19: No PE - continue O2 supplementation and CPAP - Continue letairis and tadafil - Unable to do 6MW.  - Feels worse today. NYHA IV. Will repeat RHC. Treat with doxy 100 bid for 7 days for possible bronchitis. - Encouraged her to f/u with Dr. Mortimer Fries - Stressed need to lose weight with [employer].  - Consider referral to Pulmonary Rehab at next visit  2. Nonobstructive CAD - No s/s angina  - Continue statin.  Off ASA with coumadin  3. DM2 - not candidate for SGLT2i with GFR < 25  4. CKD 4 - last creatinine 1.7 on 06/23/19 (eGFR 28)  5. Morbid obesity - stressed need for weight loss.   6. H/o PE 2009 - on warfarin.  - VQ 11/20 negative - May be able to switch to low-dose Eliquis based on Amplify Extension data  7. HTN - BP up. start amlodipine 2.5. watch for increased LE edema.   COVID screen The patient does not have any symptoms that suggest any further testing/ screening at this time.  Social distancing reinforced today.  Recommended follow-up:  As above  Relevant cardiac medications were reviewed at length with the patient today.   The patient does not have concerns regarding their medications at this time.   The following changes were made today:  As above  Today, I have spent 16 minutes with the patient with telehealth technology discussing the above issues .    Signed, Glori Bickers, MD  07/26/2019 9:39 AM  Advanced Heart Failure [place] [address] [phone] (office) [phone] (fax)

## 2019-07-26 NOTE — Telephone Encounter (Signed)
Called patient to go over avs and cath instructions. No answer left VM for pt to call back.   AVS:  Take Doxycycline 122m twice a day for 7 days.  Please follow up with Dr.Kasa in pulmonary.  Follow up (televisit) in 6 weeks.     Cath instructions:  You are scheduled for Cardiac Catheterization on Friday 07/29/2019 with Dr. BHaroldine Laws  [facility].  1. DIET _X__ Nothing to eat or drink after midnight except your medications with a sip of water.  2. [facility]: [facility]  4. X___ DO NOT TAKE any medications the morning of your procedure:       _X__ Pre-med instructions: Do not take Coumadin the night before procedure

## 2019-07-26 NOTE — Addendum Note (Signed)
Encounter addended by: Harvie Junior, CMA on: 07/26/2019 10:32 AM  Actions taken: Pharmacy for encounter modified, Order list changed, Flowsheet accepted, Clinical Note Signed, Letter saved

## 2019-07-26 NOTE — Telephone Encounter (Signed)
Pt is requesting that Dr. Mortimer Fries review cardiology's notes, as she is scheduled for right heart cath on 07/29/2019 Pt also stated that her AHI on cpap has increased between 12-14.  Pt owns her machine, therefore I am not able to pull a download from [place]. Pt is aware that SD card will need to be brought by our office for download. Pt stated that she would bring SD vy our office tomorrow.   DK please advise. Thanks

## 2019-07-26 NOTE — Telephone Encounter (Signed)
Recommend proceed with Cardiology recs [place] to bring [facility]

## 2019-07-26 NOTE — Telephone Encounter (Signed)
Pt is aware of below message and voiced her understanding.  Will await SD card for download.

## 2019-07-26 NOTE — Progress Notes (Signed)
1. Set-up RHC this week (Thursday or Friday)  2. Doxycycline 100 bid for 7 days  3. Have her f/u with Dr. Mortimer Fries in Pulmonary  4. Televist 6 weeks    Heart Cath scheduled for 07/29/2019 (covid test scheduled for tomorrow 07/27/2019). Doxycycline sent to pharmacy.  Televisit scheduled.

## 2019-07-27 ENCOUNTER — Telehealth: Payer: Self-pay | Admitting: Internal Medicine

## 2019-07-27 ENCOUNTER — Other Ambulatory Visit
Admission: RE | Admit: 2019-07-27 | Discharge: 2019-07-27 | Disposition: A | Discharge status: Home / Self Care | Payer: Medicare Other | Source: Ambulatory Visit | Attending: Internal Medicine | Admitting: Internal Medicine

## 2019-07-27 DIAGNOSIS — Z01812 Encounter for preprocedural laboratory examination: Secondary | ICD-10-CM | POA: Insufficient documentation

## 2019-07-27 DIAGNOSIS — Z20828 Contact with and (suspected) exposure to other viral communicable diseases: Secondary | ICD-10-CM | POA: Insufficient documentation

## 2019-07-27 NOTE — Telephone Encounter (Signed)
Please see 07/27/2019 phone note.

## 2019-07-27 NOTE — Telephone Encounter (Signed)
SD card as been received, however I am unable to obtain a download, as I am receiving an error message when attempting to pull DL via airview.  Pt is aware of this information and voiced her understanding.  Advised pt to contact [facility] to arrange a time to take SD card to their office for download.  SD card has been placed up front for pick up.

## 2019-07-28 LAB — SARS CORONAVIRUS 2 (TAT 6-24 HRS): SARS Coronavirus 2: NEGATIVE

## 2019-07-29 ENCOUNTER — Encounter (HOSPITAL_COMMUNITY)
Admission: RE | Disposition: A | Discharge status: Home / Self Care | Payer: Self-pay | Source: Home / Self Care | Attending: Internal Medicine

## 2019-07-29 ENCOUNTER — Other Ambulatory Visit: Payer: Self-pay

## 2019-07-29 ENCOUNTER — Ambulatory Visit (HOSPITAL_COMMUNITY)
Admission: RE | Admit: 2019-07-29 | Discharge: 2019-07-29 | Disposition: A | Discharge status: Home / Self Care | Payer: Medicare Other | Attending: Internal Medicine | Admitting: Internal Medicine

## 2019-07-29 DIAGNOSIS — Z8673 Personal history of transient ischemic attack (TIA), and cerebral infarction without residual deficits: Secondary | ICD-10-CM | POA: Diagnosis not present

## 2019-07-29 DIAGNOSIS — Z6841 Body Mass Index (BMI) 40.0 and over, adult: Secondary | ICD-10-CM | POA: Insufficient documentation

## 2019-07-29 DIAGNOSIS — I130 Hypertensive heart and chronic kidney disease with heart failure and stage 1 through stage 4 chronic kidney disease, or unspecified chronic kidney disease: Secondary | ICD-10-CM | POA: Diagnosis not present

## 2019-07-29 DIAGNOSIS — I2721 Secondary pulmonary arterial hypertension: Secondary | ICD-10-CM | POA: Diagnosis present

## 2019-07-29 DIAGNOSIS — G4733 Obstructive sleep apnea (adult) (pediatric): Secondary | ICD-10-CM | POA: Insufficient documentation

## 2019-07-29 DIAGNOSIS — Z86711 Personal history of pulmonary embolism: Secondary | ICD-10-CM | POA: Diagnosis not present

## 2019-07-29 DIAGNOSIS — Z882 Allergy status to sulfonamides status: Secondary | ICD-10-CM | POA: Diagnosis not present

## 2019-07-29 DIAGNOSIS — Z880 Allergy status to penicillin: Secondary | ICD-10-CM | POA: Diagnosis not present

## 2019-07-29 DIAGNOSIS — N184 Chronic kidney disease, stage 4 (severe): Secondary | ICD-10-CM | POA: Insufficient documentation

## 2019-07-29 DIAGNOSIS — Z79899 Other long term (current) drug therapy: Secondary | ICD-10-CM | POA: Insufficient documentation

## 2019-07-29 DIAGNOSIS — Z7901 Long term (current) use of anticoagulants: Secondary | ICD-10-CM | POA: Insufficient documentation

## 2019-07-29 DIAGNOSIS — I2510 Atherosclerotic heart disease of native coronary artery without angina pectoris: Secondary | ICD-10-CM | POA: Diagnosis not present

## 2019-07-29 DIAGNOSIS — Z794 Long term (current) use of insulin: Secondary | ICD-10-CM | POA: Insufficient documentation

## 2019-07-29 DIAGNOSIS — E1122 Type 2 diabetes mellitus with diabetic chronic kidney disease: Secondary | ICD-10-CM | POA: Diagnosis not present

## 2019-07-29 HISTORY — PX: RIGHT HEART CATH: CATH118263

## 2019-07-29 LAB — BASIC METABOLIC PANEL
Anion gap: 10 (ref 5–15)
BUN: 45 mg/dL — ABNORMAL HIGH (ref 8–23)
CO2: 27 mmol/L (ref 22–32)
Calcium: 9 mg/dL (ref 8.9–10.3)
Chloride: 107 mmol/L (ref 98–111)
Creatinine, Ser: 1.99 mg/dL — ABNORMAL HIGH (ref 0.44–1.00)
GFR calc Af Amer: 27 mL/min — ABNORMAL LOW (ref >60)
GFR calc non Af Amer: 23 mL/min — ABNORMAL LOW (ref >60)
Glucose, Bld: 85 mg/dL (ref 70–99)
Potassium: 4.2 mmol/L (ref 3.5–5.1)
Sodium: 144 mmol/L (ref 135–145)

## 2019-07-29 LAB — POCT I-STAT EG7
Bicarbonate: 26.3 mmol/L (ref 20.0–28.0)
Bicarbonate: 26.7 mmol/L (ref 20.0–28.0)
Bicarbonate: 26.9 mmol/L (ref 20.0–28.0)
Calcium, Ion: 1.15 mmol/L (ref 1.15–1.40)
Calcium, Ion: 1.21 mmol/L (ref 1.15–1.40)
Calcium, Ion: 1.22 mmol/L (ref 1.15–1.40)
HCT: 29 % — ABNORMAL LOW (ref 36.0–46.0)
HCT: 30 % — ABNORMAL LOW (ref 36.0–46.0)
HCT: 30 % — ABNORMAL LOW (ref 36.0–46.0)
Hemoglobin: 10.2 g/dL — ABNORMAL LOW (ref 12.0–15.0)
Hemoglobin: 10.2 g/dL — ABNORMAL LOW (ref 12.0–15.0)
Hemoglobin: 9.9 g/dL — ABNORMAL LOW (ref 12.0–15.0)
O2 Saturation: 72 %
O2 Saturation: 74 %
O2 Saturation: 75 %
Potassium: 3.9 mmol/L (ref 3.5–5.1)
Potassium: 4 mmol/L (ref 3.5–5.1)
Potassium: 4 mmol/L (ref 3.5–5.1)
Sodium: 143 mmol/L (ref 135–145)
Sodium: 144 mmol/L (ref 135–145)
Sodium: 144 mmol/L (ref 135–145)
TCO2: 28 mmol/L (ref 22–32)
TCO2: 28 mmol/L (ref 22–32)
TCO2: 28 mmol/L (ref 22–32)
pCO2, Ven: 49.7 mmHg (ref 44.0–60.0)
pCO2, Ven: 50.3 mmHg (ref 44.0–60.0)
pCO2, Ven: 51.8 mmHg (ref 44.0–60.0)
pH, Ven: 7.324 (ref 7.250–7.430)
pH, Ven: 7.331 (ref 7.250–7.430)
pH, Ven: 7.333 (ref 7.250–7.430)
pO2, Ven: 41 mmHg (ref 32.0–45.0)
pO2, Ven: 42 mmHg (ref 32.0–45.0)
pO2, Ven: 43 mmHg (ref 32.0–45.0)

## 2019-07-29 LAB — CBC
HCT: 34 % — ABNORMAL LOW (ref 36.0–46.0)
Hemoglobin: 10 g/dL — ABNORMAL LOW (ref 12.0–15.0)
MCH: 28.9 pg (ref 26.0–34.0)
MCHC: 29.4 g/dL — ABNORMAL LOW (ref 30.0–36.0)
MCV: 98.3 fL (ref 80.0–100.0)
Platelets: 223 10*3/uL (ref 150–400)
RBC: 3.46 MIL/uL — ABNORMAL LOW (ref 3.87–5.11)
RDW: 15.1 % (ref 11.5–15.5)
WBC: 5 10*3/uL (ref 4.0–10.5)
nRBC: 0 % (ref 0.0–0.2)

## 2019-07-29 LAB — PROTIME-INR
INR: 2 — ABNORMAL HIGH (ref 0.8–1.2)
Prothrombin Time: 22.5 seconds — ABNORMAL HIGH (ref 11.4–15.2)

## 2019-07-29 SURGERY — RIGHT HEART CATH
Anesthesia: LOCAL

## 2019-07-29 MED ORDER — ONDANSETRON HCL 4 MG/2ML IJ SOLN: 4.0000 mg | Freq: Four times a day (QID) | INTRAMUSCULAR | Status: DC | PRN

## 2019-07-29 MED ORDER — AMBRISENTAN 5 MG PO TABS
10.0000 mg | ORAL_TABLET | Freq: Every day | ORAL | Status: DC
  Filled 2019-07-29: qty 2

## 2019-07-29 MED ORDER — SODIUM CHLORIDE 0.9 % IV SOLN: 250.0000 mL | INTRAVENOUS | Status: DC | PRN

## 2019-07-29 MED ORDER — SODIUM CHLORIDE 0.9% FLUSH: 3.0000 mL | Freq: Two times a day (BID) | INTRAVENOUS | Status: DC

## 2019-07-29 MED ORDER — LIDOCAINE HCL (PF) 1 % IJ SOLN
INTRAMUSCULAR | Status: AC
  Filled 2019-07-29: qty 30

## 2019-07-29 MED ORDER — HEPARIN (PORCINE) IN NACL 1000-0.9 UT/500ML-% IV SOLN
INTRAVENOUS | Status: AC
  Filled 2019-07-29: qty 500

## 2019-07-29 MED ORDER — SODIUM CHLORIDE 0.9% FLUSH: 3.0000 mL | INTRAVENOUS | Status: DC | PRN

## 2019-07-29 MED ORDER — ACETAMINOPHEN 325 MG PO TABS: 650.0000 mg | ORAL_TABLET | ORAL | Status: DC | PRN

## 2019-07-29 MED ORDER — HEPARIN (PORCINE) IN NACL 1000-0.9 UT/500ML-% IV SOLN
INTRAVENOUS | Status: DC | PRN
  Administered 2019-07-29: 500 mL

## 2019-07-29 MED ORDER — LIDOCAINE HCL (PF) 1 % IJ SOLN
INTRAMUSCULAR | Status: DC | PRN
  Administered 2019-07-29: 2 mL

## 2019-07-29 MED ORDER — ASPIRIN 81 MG PO CHEW: 81.0000 mg | CHEWABLE_TABLET | ORAL | Status: DC

## 2019-07-29 MED ORDER — LABETALOL HCL 5 MG/ML IV SOLN: 10.0000 mg | INTRAVENOUS | Status: DC | PRN

## 2019-07-29 MED ORDER — HYDRALAZINE HCL 20 MG/ML IJ SOLN: 10.0000 mg | INTRAMUSCULAR | Status: DC | PRN

## 2019-07-29 MED ORDER — SODIUM CHLORIDE 0.9 % IV SOLN: INTRAVENOUS | Status: DC

## 2019-07-29 SURGICAL SUPPLY — 7 items
CATH BALLN WEDGE 5F 110CM (CATHETERS) ×2 IMPLANT
PACK CARDIAC CATHETERIZATION (CUSTOM PROCEDURE TRAY) ×2 IMPLANT
SHEATH GLIDE SLENDER 4/5FR (SHEATH) ×2 IMPLANT
TRANSDUCER W/STOPCOCK (MISCELLANEOUS) ×2 IMPLANT
TUBING ART PRESS 72  MALE/FEM (TUBING) ×1
TUBING ART PRESS 72 MALE/FEM (TUBING) ×1 IMPLANT
WIRE EMERALD 3MM-J .025X260CM (WIRE) ×2 IMPLANT

## 2019-07-29 NOTE — Research (Signed)
[place] Informed Consent   Subject Name: Ann Flynn  Subject met inclusion and exclusion criteria.  The informed consent form, study requirements and expectations were reviewed with the subject and questions and concerns were addressed prior to the signing of the consent form.  The subject verbalized understanding of the trail requirements.  The subject agreed to participate in the [facility] trial and signed the informed consent.  The informed consent was obtained prior to performance of any protocol-specific procedures for the subject.  A copy of the signed informed consent was given to the subject and a copy was placed in the subject's medical record.  [place], [name] 07/29/2019, 0156

## 2019-07-29 NOTE — Discharge Instructions (Signed)
 This sheet gives you information about how to care for yourself after your procedure. Your health care provider may also give you more specific instructions. If you have problems or questions, contact your health care provider. What can I expect after the procedure? After the procedure, it is common to have:  Bruising and tenderness at the catheter insertion area. Follow these instructions at home: Medicines  Take over-the-counter and prescription medicines only as told by your health care provider. Insertion site care  Follow instructions from your health care provider about how to take care of your insertion site. Make sure you: ? Wash your hands with soap and water before you change your bandage (dressing). If soap and water are not available, use hand sanitizer. ? Change your dressing as told by your health care provider. ? Leave stitches (sutures), skin glue, or adhesive strips in place. These skin closures may need to stay in place for 2 weeks or longer. If adhesive strip edges start to loosen and curl up, you may trim the loose edges. Do not remove adhesive strips completely unless your health care provider tells you to do that.  Check your insertion site every day for signs of infection. Check for: ? Redness, swelling, or pain. ? Fluid or blood. ? Pus or a bad smell. ? Warmth.  Do not take baths, swim, or use a hot tub until your health care provider approves.  You may shower 24-48 hours after the procedure, or as directed by your health care provider. ? Remove the dressing and gently wash the site with plain soap and water. ? Pat the area dry with a clean towel. ? Do not rub the site. That could cause bleeding.  Do not apply powder or lotion to the site. Activity   For 24 hours after the procedure, or as directed by your health care provider: ? Do not flex or bend the affected arm. ? Do not push or pull heavy objects with the affected arm. ? Do not drive yourself home  from the hospital or clinic. You may drive 24 hours after the procedure unless your health care provider tells you not to. ? Do not operate machinery or power tools.  Do not lift anything that is heavier than 10 lb (4.5 kg), or the limit that you are told, until your health care provider says that it is safe.  Ask your health care provider when it is okay to: ? Return to work or school. ? Resume usual physical activities or sports. ? Resume sexual activity. General instructions  If the catheter site starts to bleed, raise your arm and put firm pressure on the site. If the bleeding does not stop, get help right away. This is a medical emergency.  If you went home on the same day as your procedure, a responsible adult should be with you for the first 24 hours after you arrive home.  Keep all follow-up visits as told by your health care provider. This is important. Contact a health care provider if:  You have a fever.  You have redness, swelling, or yellow drainage around your insertion site. Get help right away if:  You have unusual pain at the radial site.  The catheter insertion area swells very fast.  The insertion area is bleeding, and the bleeding does not stop when you hold steady pressure on the area.  Your arm or hand becomes pale, cool, tingly, or numb. These symptoms may represent a serious problem that is an   emergency. Do not wait to see if the symptoms will go away. Get medical help right away. Call your local emergency services (911 in the [place] Do not drive yourself to the hospital. Summary  After the procedure, it is common to have bruising and tenderness at the site.  Follow instructions from your health care provider about how to take care of your radial site wound. Check the wound every day for signs of infection.  Do not lift anything that is heavier than 10 lb (4.5 kg), or the limit that you are told, until your health care provider says that it is safe. This  information is not intended to replace advice given to you by your health care provider. Make sure you discuss any questions you have with your health care provider. Document Released: 09/06/2010 Document Revised: 09/09/2017 Document Reviewed: 09/09/2017 Elsevier Patient Education  2020 [employer].

## 2019-07-29 NOTE — Interval H&P Note (Signed)
History and Physical Interval Note:  07/29/2019 9:47 AM  Ann Flynn  has presented today for surgery, with the diagnosis of PAH  The various methods of treatment have been discussed with the patient and family. After consideration of risks, benefits and other options for treatment, the patient has consented to  Procedure(s): RIGHT HEART CATH (N/A) as a surgical intervention.  The patient's history has been reviewed, patient examined, no change in status, stable for surgery.  I have reviewed the patient's chart and labs.  Questions were answered to the patient's satisfaction.     [name]

## 2019-07-29 NOTE — Progress Notes (Signed)
Discharge instructions reviewed with pt and her son in law. Both voice understanding,

## 2019-08-01 ENCOUNTER — Other Ambulatory Visit: Payer: Self-pay | Admitting: Physician Assistant

## 2019-08-01 NOTE — Telephone Encounter (Signed)
Please review for refill for BD PEN NEEDLE NANO U/F 32G X 4 MM and losartan 100 mg. The pen needle is not on the med list. Could not pend  losartan for refill with the needles

## 2019-08-04 ENCOUNTER — Other Ambulatory Visit: Payer: Self-pay

## 2019-08-04 ENCOUNTER — Encounter: Payer: Self-pay | Admitting: Physician Assistant

## 2019-08-04 ENCOUNTER — Ambulatory Visit (INDEPENDENT_AMBULATORY_CARE_PROVIDER_SITE_OTHER): Payer: Medicare Other | Admitting: Physician Assistant

## 2019-08-04 DIAGNOSIS — Z8673 Personal history of transient ischemic attack (TIA), and cerebral infarction without residual deficits: Secondary | ICD-10-CM

## 2019-08-04 LAB — POCT INR
INR: 2.9 (ref 2.0–3.0)
PT: 34.9

## 2019-08-04 NOTE — Progress Notes (Signed)
Patient: Ann Flynn Female    DOB: 05-23-1941   78 y.o.   MRN: [id number] Visit Date: 08/04/2019  Today's Provider: Trinna Post, PA-C   Chief Complaint  Patient presents with  . Follow-up   Subjective:     HPI  Follow up for history of stroke:  The patient was last seen for this 1 months ago. Changes made at last visit include none.  She reports good compliance with treatment. She feels that condition is Unchanged. She is not having side effects.   Her INR has been supratherapuetic in the past few weeks. She recently had a cardiac catheterization in the past week. Her Cardiologist increased her Lasix because she had fluid around the heart. She sees him next month.  Lab Results  Component Value Date   INR 2.9 08/04/2019   INR 2.0 (H) 07/29/2019   INR 4.2 (A) 07/19/2019     ------------------------------------------------------------------------------------  Allergies  Allergen Reactions  . Penicillins Swelling and Rash    Facial swelling Did it involve swelling of the face/tongue/throat, SOB, or low BP? Yes Did it involve sudden or severe rash/hives, skin peeling, or any reaction on the inside of your mouth or nose? No Did you need to seek medical attention at a hospital or doctor's office? No When did it last happen?10 + years If all above answers are "NO", may proceed with cephalosporin use.   . Sulfa Antibiotics Rash     Current Outpatient Medications:  .  acetaminophen (TYLENOL) 500 MG tablet, Take 500 mg by mouth 2 (two) times daily as needed for moderate pain., Disp: , Rfl:  .  ambrisentan (LETAIRIS) 5 MG tablet, Take 1 tablet (5 mg total) by mouth daily. (Patient taking differently: Take 5 mg by mouth every evening. ), Disp: 90 tablet, Rfl: 1 .  amLODipine (NORVASC) 5 MG tablet, Take 1 tablet (5 mg total) by mouth daily. (Patient taking differently: Take 5 mg by mouth every evening. ), Disp: 30 tablet, Rfl: 6 .  atorvastatin (LIPITOR)  80 MG tablet, Take 1 tablet (80 mg total) by mouth daily. (Patient taking differently: Take 80 mg by mouth at bedtime. ), Disp: 90 tablet, Rfl: 0 .  BD PEN NEEDLE NANO U/F 32G X 4 MM MISC, USE TO INJECT ONCE DAILY AS DIRECTED, Disp: 100 each, Rfl: 0 .  Cyanocobalamin (B-12 PO), Take 1 tablet by mouth daily., Disp: , Rfl:  .  docusate sodium (COLACE) 100 MG capsule, Take 100 mg by mouth at bedtime., Disp: , Rfl:  .  Dulaglutide (TRULICITY) 7.37 VG/6.8DP SOPN, Inject 0.75 mg into the skin once a week. (Patient taking differently: Inject 0.75 mg into the skin every Monday. ), Disp: 4 pen, Rfl: 0 .  furosemide (LASIX) 40 MG tablet, Take 40 mg by mouth every other day. , Disp: , Rfl:  .  Insulin Glargine, 1 Unit Dial, (TOUJEO SOLOSTAR) 300 UNIT/ML SOPN, Inject 50 Units into the skin daily. 50 units daily. (Patient taking differently: Inject 40-50 Units into the skin at bedtime. ), Disp: 6 pen, Rfl: 2 .  JANUVIA 25 MG tablet, TAKE ONE TABLET BY MOUTH EVERY DAY (Patient taking differently: Take 25 mg by mouth daily. ), Disp: 90 tablet, Rfl: 0 .  losartan (COZAAR) 100 MG tablet, TAKE 1 TABLET BY MOUTH DAILY, Disp: 30 tablet, Rfl: 2 .  metoprolol tartrate (LOPRESSOR) 25 MG tablet, Take 25 mg by mouth 2 (two) times daily., Disp: , Rfl:  .  omeprazole (  PRILOSEC) 40 MG capsule, TAKE 1 CAPSULE BY MOUTH DAILY, Disp: 30 capsule, Rfl: 2 .  tadalafil (CIALIS) 20 MG tablet, Take 2 tablets (40 mg total) by mouth daily. (Patient taking differently: Take 40 mg by mouth at bedtime. ), Disp: 180 tablet, Rfl: 1 .  venlafaxine (EFFEXOR) 75 MG tablet, Take 1 tablet (75 mg total) by mouth daily., Disp: 90 tablet, Rfl: 1 .  VITAMIN D PO, Take 1 capsule by mouth daily., Disp: , Rfl:  .  warfarin (COUMADIN) 4 MG tablet, Take 2-4 mg by mouth See admin instructions. Take 4 mg at night on Sun, Tue, Thurs, and Sat, take 2 mg at night on Mon, Wed, and Fri, Disp: , Rfl:   Review of Systems  Constitutional: Negative for appetite  change, chills, fatigue and fever.  Respiratory: Negative for chest tightness and shortness of breath.   Cardiovascular: Negative for chest pain and palpitations.  Gastrointestinal: Negative for abdominal pain, nausea and vomiting.  Neurological: Negative for dizziness and weakness.    Social History   Tobacco Use  . Smoking status: Never Smoker  . Smokeless tobacco: Never Used  Substance Use Topics  . Alcohol use: Never      Objective:   BP 132/62 (BP Location: Left Arm, Patient Position: Sitting, Cuff Size: Large)   Pulse (!) 52   Temp (!) 96.8 F (36 C) (Temporal)   Resp 18   Wt 251 lb (113.9 kg)   SpO2 97% Comment: 2 L 02 nasal canula  BMI 41.77 kg/m  Vitals:   08/04/19 0947  BP: 132/62  Pulse: (!) 52  Resp: 18  Temp: (!) 96.8 F (36 C)  TempSrc: Temporal  SpO2: 97%  Weight: 251 lb (113.9 kg)  Body mass index is 41.77 kg/m.   Physical Exam Constitutional:      Appearance: Normal appearance.  Cardiovascular:     Rate and Rhythm: Normal rate.     Heart sounds: Normal heart sounds.  Pulmonary:     Breath sounds: Normal breath sounds.     Comments: Slight increased work of breathing which seems to be her baseline.  Neurological:     Mental Status: She is alert and oriented to person, place, and time. Mental status is at baseline.  Psychiatric:        Mood and Affect: Mood normal.        Behavior: Behavior normal.      No results found for any visits on 08/04/19.     Assessment & Plan    1. History of stroke  Within therapuetic range. Continue Coumadin as prescribed.   - POCT INR      Trinna Post, PA-C  [facility]

## 2019-08-24 ENCOUNTER — Telehealth: Payer: Self-pay | Admitting: Internal Medicine

## 2019-08-24 DIAGNOSIS — J9611 Chronic respiratory failure with hypoxia: Secondary | ICD-10-CM

## 2019-08-24 NOTE — Telephone Encounter (Signed)
Lm for pt.

## 2019-08-25 NOTE — Telephone Encounter (Signed)
Lm x2 for pt.

## 2019-08-29 NOTE — Telephone Encounter (Signed)
okay

## 2019-08-29 NOTE — Telephone Encounter (Signed)
Called and spoke to pt, who is requesting an order for POC to be sent to [facility].  Dr. Mortimer Fries, please advise if okay to order. Thanks

## 2019-08-30 ENCOUNTER — Ambulatory Visit (INDEPENDENT_AMBULATORY_CARE_PROVIDER_SITE_OTHER): Payer: Medicare Other

## 2019-08-30 ENCOUNTER — Other Ambulatory Visit: Payer: Self-pay

## 2019-08-30 DIAGNOSIS — Z8673 Personal history of transient ischemic attack (TIA), and cerebral infarction without residual deficits: Secondary | ICD-10-CM | POA: Diagnosis not present

## 2019-08-30 LAB — POCT INR
INR: 2.6 (ref 2.0–3.0)
PT: 31.7

## 2019-08-30 NOTE — Telephone Encounter (Signed)
Order has been placed.  Pt is aware and voiced her understanding.  Nothing further is needed.

## 2019-08-30 NOTE — Progress Notes (Signed)
..  Description   Dx: Hx of stroke Current Coumadin Dose: 25m QD,  except 266mMonday, Wednesday and Friday PT: 31.7 INR: 2.6 Today's Changes: NO CHANGE Recheck: 4 weeks

## 2019-08-30 NOTE — Addendum Note (Signed)
Addended by: Maryanna Shape A on: 08/30/2019 01:52 PM   Modules accepted: Orders

## 2019-08-31 ENCOUNTER — Telehealth (HOSPITAL_COMMUNITY): Payer: Self-pay

## 2019-08-31 NOTE — Telephone Encounter (Signed)
Pt called to verify ambrisentan dosage. Verified that her dose is 48m tab daily. Pt verbalized understanding.

## 2019-09-02 ENCOUNTER — Telehealth: Payer: Self-pay

## 2019-09-02 DIAGNOSIS — I270 Primary pulmonary hypertension: Secondary | ICD-10-CM

## 2019-09-02 MED ORDER — METOPROLOL TARTRATE 25 MG PO TABS: 25.0000 mg | ORAL_TABLET | Freq: Two times a day (BID) | ORAL | 1 refills | Status: DC

## 2019-09-02 NOTE — Telephone Encounter (Signed)
[facility] faxed over a refill request for  metoprolol tartrate 25 MG tablets. L.O.V. was on 08/04/2019. Medication refill was send to pharmacy.

## 2019-09-07 ENCOUNTER — Ambulatory Visit (HOSPITAL_COMMUNITY)
Admission: RE | Admit: 2019-09-07 | Discharge: 2019-09-07 | Disposition: A | Discharge status: Home / Self Care | Payer: Medicare Other | Source: Ambulatory Visit | Attending: Internal Medicine | Admitting: Internal Medicine

## 2019-09-07 ENCOUNTER — Other Ambulatory Visit: Payer: Self-pay

## 2019-09-07 DIAGNOSIS — Z86711 Personal history of pulmonary embolism: Secondary | ICD-10-CM

## 2019-09-07 DIAGNOSIS — I272 Pulmonary hypertension, unspecified: Secondary | ICD-10-CM | POA: Diagnosis not present

## 2019-09-07 DIAGNOSIS — N184 Chronic kidney disease, stage 4 (severe): Secondary | ICD-10-CM

## 2019-09-07 DIAGNOSIS — E1122 Type 2 diabetes mellitus with diabetic chronic kidney disease: Secondary | ICD-10-CM

## 2019-09-07 DIAGNOSIS — I10 Essential (primary) hypertension: Secondary | ICD-10-CM

## 2019-09-07 DIAGNOSIS — Z7901 Long term (current) use of anticoagulants: Secondary | ICD-10-CM

## 2019-09-07 DIAGNOSIS — I129 Hypertensive chronic kidney disease with stage 1 through stage 4 chronic kidney disease, or unspecified chronic kidney disease: Secondary | ICD-10-CM

## 2019-09-07 DIAGNOSIS — I2510 Atherosclerotic heart disease of native coronary artery without angina pectoris: Secondary | ICD-10-CM

## 2019-09-07 DIAGNOSIS — I270 Primary pulmonary hypertension: Secondary | ICD-10-CM

## 2019-09-07 DIAGNOSIS — Z7722 Contact with and (suspected) exposure to environmental tobacco smoke (acute) (chronic): Secondary | ICD-10-CM

## 2019-09-07 DIAGNOSIS — I5032 Chronic diastolic (congestive) heart failure: Secondary | ICD-10-CM

## 2019-09-07 NOTE — Progress Notes (Addendum)
Heart Failure TeleHealth Note  Due to national recommendations of social distancing due to [place] 19, Audio/video telehealth visit is felt to be most appropriate for this patient at this time.  See MyChart message from today for patient consent regarding telehealth for [facility]  Date:  09/07/2019   ID:  Ann Flynn, DOB 15-May-1941, MRN [id number]  Location: Home  Provider location: [facility] Type of Visit: Established patient  PCP:  Ann Post, PA-C  Cardiologist:  Ann Flynn Primary HF: Ann Flynn  Chief Complaint: PAH follow-up   History of Present Illness:  [name] is a 79 year old woman with h/o morbid obesity, DM, OSA on CPAP, CKD 4, PAD, CVA, carotid surgery in 2010, pulmonary embolism (2009) and PAH referred by Ann Flynn for further evaluation of her [place].   She recently moved from [place], [place] to be near her family as things were getting harder. Lives by herself in a townhome.  Reports DVT/PE in 2009 and has been on coumadin since. Was diagnosed with PAH in 2016 at [facility] in [place]. Right and left heart cath at that time reported as nonobstructive CAD and severe PAH with PAP 103/19 (51) with PCWP 5. (see results below) Was told she had idiopathic PAH and started on Letairis and cialis. She weighed 255 pounds at the time.   Never smoked but was exposed to 2ndhand smoke. Previouslyon portable oxygen at 5L when she was in [place], but the portable O2 was not covered. Now "goes without oxygen", only at nightthat she wear oxygen with CPAP. Denies h/o CTD.   Has seen Ann Flynn in Pulmonary and had PFTs. Unable to do 6MW.  PFTs 2/10  FEV1 1.92 (57%) FVC 2.57 (58%) DLCO 61%  I saw her for the first time in 11/20: Felt to have primarily WHO Group III PH. Echo and VQ ordered. Amlodipine 2.5 added for HTN  - Echo 07/07/19: EF 55-60% RV mildly dilated with norma function. No RVSP calculated - VQ 07/07/19: No  PE  RHC  07/29/19 RA = 8 RV = 81/14 PA =  80/18 (42) PCW =17 Fick cardiac output/index = 9.25/4.25 PVR = 2.70 WU FA sat = 98% PA sat = 75%, 75% High SVC 72%  She presents via Engineer, civil (consulting) for a telehealth visit today. Said her breathing is a little better. Able to do ADLs but struggles with steps. No CP. Very mild LE edema. Taking lasix 40 mg daily which helps. No dizziness. No presyncope or syncope. Wears O2 "all the time" with sats in low 90s. Pulse rate goes up to 130s with exertion - says it got worse when she increased letairis to 84m daily. Uses CPAP every night. (AHI < 1.0)  Previous cardiac studies:    12/16/2014 right and left heart catheterization with nitric oxide inhalation, Pressure data resting: -Mean right atrial pressure 2 Right ventricule 1545/6and diastolic -Pulmonary artery 103/19, mean 51. Pulmonary artery wedge mean 5 -Ascending thoracic aorta 111/53, mean 75. -Fick cardiac output 4.2 L. -Pulmonary resistance 12.25 Woods units. With nitric oxide inhalation -Pulmonary artery 89/29, mean 42. -Right ventricle 925/6and diastolic. -Right atrial 2. -Fick cardiac output 4.94 L. -Pulmonary resistance 15.91 Woods units.   Ann Flynn symptoms worrisome for COVID 19.   No past medical history on file. Past Surgical History:  Procedure Laterality Date  . ABDOMINAL HYSTERECTOMY  1997  . cartiod artery surgery  2010   per patient   . CHOLECYSTECTOMY  10/19/2009  . MASTECTOMY  1994  . RIGHT HEART CATH N/A 07/29/2019   Procedure: RIGHT HEART CATH;  Surgeon: Ann Artist, MD;  Location: [facility];  Service: Cardiovascular;  Laterality: N/A;     Current Outpatient Medications  Medication Sig Dispense Refill  . acetaminophen (TYLENOL) 500 MG tablet Take 500 mg by mouth 2 (two) times daily as needed for moderate pain.    [name] ambrisentan (LETAIRIS) 5 MG tablet Take 1 tablet (5 mg total) by mouth daily. (Patient taking  differently: Take 5 mg by mouth every evening. ) 90 tablet 1  . amLODipine (NORVASC) 5 MG tablet Take 1 tablet (5 mg total) by mouth daily. (Patient taking differently: Take 5 mg by mouth every evening. ) 30 tablet 6  . atorvastatin (LIPITOR) 80 MG tablet Take 1 tablet (80 mg total) by mouth daily. (Patient taking differently: Take 80 mg by mouth at bedtime. ) 90 tablet 0  . BD PEN NEEDLE NANO U/F 32G X 4 MM MISC USE TO INJECT ONCE DAILY AS DIRECTED 100 each 0  . Cyanocobalamin (B-12 PO) Take 1 tablet by mouth daily.    [name] docusate sodium (COLACE) 100 MG capsule Take 100 mg by mouth at bedtime.    . Dulaglutide (TRULICITY) 2.02 RK/2.7CW SOPN Inject 0.75 mg into the skin once a week. (Patient taking differently: Inject 0.75 mg into the skin every Monday. ) 4 pen 0  . furosemide (LASIX) 40 MG tablet Take 40 mg by mouth every other day.     . Insulin Glargine, 1 Unit Dial, (TOUJEO SOLOSTAR) 300 UNIT/ML SOPN Inject 50 Units into the skin daily. 50 units daily. (Patient taking differently: Inject 40-50 Units into the skin at bedtime. ) 6 pen 2  . JANUVIA 25 MG tablet TAKE ONE TABLET BY MOUTH EVERY DAY (Patient taking differently: Take 25 mg by mouth daily. ) 90 tablet 0  . losartan (COZAAR) 100 MG tablet TAKE 1 TABLET BY MOUTH DAILY 30 tablet 2  . metoprolol tartrate (LOPRESSOR) 25 MG tablet Take 1 tablet (25 mg total) by mouth 2 (two) times daily. 90 tablet 1  . omeprazole (PRILOSEC) 40 MG capsule TAKE 1 CAPSULE BY MOUTH DAILY 30 capsule 2  . tadalafil (CIALIS) 20 MG tablet Take 2 tablets (40 mg total) by mouth daily. (Patient taking differently: Take 40 mg by mouth at bedtime. ) 180 tablet 1  . venlafaxine (EFFEXOR) 75 MG tablet Take 1 tablet (75 mg total) by mouth daily. 90 tablet 1  . VITAMIN D PO Take 1 capsule by mouth daily.    [name] warfarin (COUMADIN) 4 MG tablet Take 2-4 mg by mouth See admin instructions. Take 4 mg at night on Sun, Tue, Thurs, and Sat, take 2 mg at night on Mon, Wed, and Fri      No current facility-administered medications for this encounter.    Allergies:   Penicillins and Sulfa antibiotics   Social History:  The patient  reports that she has never smoked. She has never used smokeless tobacco. She reports that she does not drink alcohol or use drugs.   Family History:  The patient's family history is not on file.   ROS:  Please see the history of present illness.   All other systems are personally reviewed and negative.   Exam:  (Video/Tele Health Call; Exam is subjective and or/visual.) General:  Speaks in full sentences. Mild SOB.  Lungs: Normal respiratory effort with conversation.  Abdomen: Non-distended per patient report  Extremities: Pt endorses mild edema. Neuro: Alert & oriented x 3.   Recent Labs: 03/28/2019: ALT 8 06/23/2019: B Natriuretic Peptide 549.8 07/29/2019: BUN 45; Creatinine, Ser 1.99; Hemoglobin 9.9; Platelets 223; Potassium 3.9; Sodium 144  Personally reviewed   Wt Readings from Last 3 Encounters:  08/04/19 113.9 kg (251 lb)  07/29/19 113.4 kg (250 lb)  06/23/19 115.7 kg (255 lb)      ASSESSMENT AND PLAN:  1. Pulmonary HTN, severe - suspect this is really WHO Group 3 disease due to OSA/OHS but given markedly elevate PVR may also have component of WHO Group I & IV - cath results from 12/20 reviewed with her PA pressures now down 25% from previous. PVR low.  - will continue tadalafil 40 and letairis 5 (unable to tolerate higher dose of letairis). With low PVR would note add selxipag - PFTs with significant restrictive lung physiology - Echo 07/07/19: EF 55-60% RV mildly dilated with norma function. No RVSP calculated. BNP 549 - VQ 07/07/19: No PE - continue O2 supplementation and CPAP - Unable to do 6MW.  - Stable NYHA III - Stressed need to lose weight with [employer].  - Refer to Pulmonary Rehab at next visit  2. Nonobstructive CAD - No s/s angina  - Continue statin. Off ASA with coumadin  3. DM2 - not  candidate for SGLT2i with GFR < 25  4. CKD 4 - last creatinine 1.7 on 06/23/19 (eGFR 28)  5. Morbid obesity - stressed need for weight loss as above  6. H/o PE 2009 - on warfarin.  - VQ 11/20 negative - May be able to switch to low-dose Eliquis based on Am[employer] Extension data  7. HTN - BP improved with amlodipine  Signed, Glori Bickers, MD  09/07/2019 10:39 AM  Advanced Heart Failure [address] [place] [id number] [phone] (office) [phone] (fax)

## 2019-09-07 NOTE — Patient Instructions (Signed)
It was great to speak with you today! No medication changes are needed at this time.  Your physician recommends that you schedule a follow-up appointment in: June 2021  Your physician recommends that you schedule a follow-up appointment in: Pulmonary Rehab- They will be in contact with you to set up orientation  [phone]  Do the following things EVERYDAY: 1) Weigh yourself in the morning before breakfast. Write it down and keep it in a log. 2) Take your medicines as prescribed 3) Eat low salt foods--Limit salt (sodium) to 2000 mg per day.  4) Stay as active as you can everyday 5) Limit all fluids for the day to less than 2 liters  At the [facility], you and your health needs are our priority. As part of our continuing mission to provide you with exceptional heart care, we have created designated Provider Care Teams. These Care Teams include your primary Cardiologist (physician) and Advanced Practice Providers (APPs- Physician Assistants and Nurse Practitioners) who all work together to provide you with the care you need, when you need it.   You may see any of the following providers on your designated Care Team at your next follow up: [name] Dr Glori Bickers . Dr Loralie Champagne . Darrick Grinder, NP . Lyda Jester, PA . Audry Riles, PharmD   Please be sure to bring in all your medications bottles to every appointment.

## 2019-09-07 NOTE — Addendum Note (Signed)
Encounter addended by: Kerry Dory, CMA on: 09/07/2019 4:16 PM  Actions taken: Order list changed, Diagnosis association updated, Clinical Note Signed

## 2019-09-21 ENCOUNTER — Other Ambulatory Visit: Payer: Self-pay

## 2019-09-21 ENCOUNTER — Encounter: Payer: Self-pay | Admitting: *Deleted

## 2019-09-21 ENCOUNTER — Encounter: Payer: Medicare Other | Attending: Internal Medicine | Admitting: *Deleted

## 2019-09-21 DIAGNOSIS — Z79899 Other long term (current) drug therapy: Secondary | ICD-10-CM | POA: Insufficient documentation

## 2019-09-21 DIAGNOSIS — E118 Type 2 diabetes mellitus with unspecified complications: Secondary | ICD-10-CM | POA: Insufficient documentation

## 2019-09-21 DIAGNOSIS — Z794 Long term (current) use of insulin: Secondary | ICD-10-CM | POA: Insufficient documentation

## 2019-09-21 DIAGNOSIS — I270 Primary pulmonary hypertension: Secondary | ICD-10-CM | POA: Insufficient documentation

## 2019-09-21 DIAGNOSIS — I272 Pulmonary hypertension, unspecified: Secondary | ICD-10-CM

## 2019-09-21 DIAGNOSIS — Z7901 Long term (current) use of anticoagulants: Secondary | ICD-10-CM | POA: Insufficient documentation

## 2019-09-21 NOTE — Progress Notes (Signed)
Completed virtual orientation today.  EP eval scheduled for 2/4 at 930.

## 2019-09-22 ENCOUNTER — Encounter: Payer: Medicare Other | Admitting: *Deleted

## 2019-09-22 ENCOUNTER — Other Ambulatory Visit: Payer: Self-pay

## 2019-09-22 VITALS — Ht 66.5 in | Wt 250.8 lb

## 2019-09-22 DIAGNOSIS — Z794 Long term (current) use of insulin: Secondary | ICD-10-CM | POA: Diagnosis not present

## 2019-09-22 DIAGNOSIS — Z79899 Other long term (current) drug therapy: Secondary | ICD-10-CM | POA: Diagnosis not present

## 2019-09-22 DIAGNOSIS — Z7901 Long term (current) use of anticoagulants: Secondary | ICD-10-CM | POA: Diagnosis not present

## 2019-09-22 DIAGNOSIS — E118 Type 2 diabetes mellitus with unspecified complications: Secondary | ICD-10-CM | POA: Diagnosis not present

## 2019-09-22 DIAGNOSIS — I272 Pulmonary hypertension, unspecified: Secondary | ICD-10-CM

## 2019-09-22 DIAGNOSIS — I270 Primary pulmonary hypertension: Secondary | ICD-10-CM | POA: Diagnosis not present

## 2019-09-22 NOTE — Patient Instructions (Signed)
Patient Instructions  Patient Details  Name: Ann Flynn MRN: [id number] Date of Birth: [birth year] Referring Provider:  Jolaine Artist, MD  Below are your personal goals for exercise, nutrition, and risk factors. Our goal is to help you stay on track towards obtaining and maintaining these goals. We will be discussing your progress on these goals with you throughout the program.  Initial Exercise Prescription: Initial Exercise Prescription - 09/22/19 1500      Date of Initial Exercise RX and Referring Provider   Date  09/22/19    Referring Provider  Bensimhon, Daniel MD      Oxygen   Oxygen  Continuous    Liters  3      Treadmill   MPH  0.8    Grade  0    Minutes  15    METs  1.6      NuStep   Level  1    SPM  80    Minutes  15    METs  1.5      T5 Nustep   Level  1    SPM  80    Minutes  15    METs  1.5      Biostep-RELP   Level  1    SPM  50    Minutes  15    METs  1      Prescription Details   Frequency (times per week)  2    Duration  Progress to 30 minutes of continuous aerobic without signs/symptoms of physical distress      Intensity   THRR 40-80% of Max Heartrate  94-126    Ratings of Perceived Exertion  11-13    Perceived Dyspnea  0-4      Progression   Progression  Continue to progress workloads to maintain intensity without signs/symptoms of physical distress.      Resistance Training   Training Prescription  Yes    Weight  3 lb    Reps  10-15       Exercise Goals: Frequency: Be able to perform aerobic exercise two to three times per week in program working toward 2-5 days per week of home exercise.  Intensity: Work with a perceived exertion of 11 (fairly light) - 15 (hard) while following your exercise prescription.  We will make changes to your prescription with you as you progress through the program.   Duration: Be able to do 30 to 45 minutes of continuous aerobic exercise in addition to a 5 minute warm-up and a 5 minute  cool-down routine.   Nutrition Goals: Your personal nutrition goals will be established when you do your nutrition analysis with the dietician.  The following are general nutrition guidelines to follow: Cholesterol < 232m/day Sodium < 1505mday Fiber: Women over 50 yrs - 21 grams per day  Personal Goals: Personal Goals and Risk Factors at Admission - 09/21/19 0856      Core Components/Risk Factors/Patient Goals on Admission    Weight Management  Yes;Weight Loss    Intervention  Weight Management: Develop a combined nutrition and exercise program designed to reach desired caloric intake, while maintaining appropriate intake of nutrient and fiber, sodium and fats, and appropriate energy expenditure required for the weight goal.;Weight Management: Provide education and appropriate resources to help participant work on and attain dietary goals.    Expected Outcomes  Short Term: Continue to assess and modify interventions until short term weight is achieved;Long Term: Adherence to nutrition and physical activity/exercise  program aimed toward attainment of established weight goal;Weight Loss: Understanding of general recommendations for a balanced deficit meal plan, which promotes 1-2 lb weight loss per week and includes a negative energy balance of [phone] kcal/d;Understanding recommendations for meals to include 15-35% energy as protein, 25-35% energy from fat, 35-60% energy from carbohydrates, less than 273m of dietary cholesterol, 20-35 gm of total fiber daily;Understanding of distribution of calorie intake throughout the day with the consumption of 4-5 meals/snacks    Diabetes  Yes    Intervention  Provide education about signs/symptoms and action to take for hypo/hyperglycemia.;Provide education about proper nutrition, including hydration, and aerobic/resistive exercise prescription along with prescribed medications to achieve blood glucose in normal ranges: Fasting glucose 65-99 mg/dL     Expected Outcomes  Short Term: Participant verbalizes understanding of the signs/symptoms and immediate care of hyper/hypoglycemia, proper foot care and importance of medication, aerobic/resistive exercise and nutrition plan for blood glucose control.;Long Term: Attainment of HbA1C < 7%.    Heart Failure  Yes    Intervention  Provide a combined exercise and nutrition program that is supplemented with education, support and counseling about heart failure. Directed toward relieving symptoms such as shortness of breath, decreased exercise tolerance, and extremity edema.    Expected Outcomes  Improve functional capacity of life;Short term: Attendance in program 2-3 days a week with increased exercise capacity. Reported lower sodium intake. Reported increased fruit and vegetable intake. Reports medication compliance.;Short term: Daily weights obtained and reported for increase. Utilizing diuretic protocols set by physician.;Long term: Adoption of self-care skills and reduction of barriers for early signs and symptoms recognition and intervention leading to self-care maintenance.    Hypertension  Yes    Intervention  Provide education on lifestyle modifcations including regular physical activity/exercise, weight management, moderate sodium restriction and increased consumption of fresh fruit, vegetables, and low fat dairy, alcohol moderation, and smoking cessation.;Monitor prescription use compliance.    Expected Outcomes  Short Term: Continued assessment and intervention until BP is < 140/934mHG in hypertensive participants. < 130/8064mG in hypertensive participants with diabetes, heart failure or chronic kidney disease.;Long Term: Maintenance of blood pressure at goal levels.    Lipids  Yes    Intervention  Provide education and support for participant on nutrition & aerobic/resistive exercise along with prescribed medications to achieve LDL <68m46mDL >40mg68m Expected Outcomes  Short Term: Participant  states understanding of desired cholesterol values and is compliant with medications prescribed. Participant is following exercise prescription and nutrition guidelines.;Long Term: Cholesterol controlled with medications as prescribed, with individualized exercise RX and with personalized nutrition plan. Value goals: LDL < 68mg,23m > 40 mg.       Tobacco Use Initial Evaluation: Social History   Tobacco Use  Smoking Status Never Smoker  Smokeless Tobacco Never Used    Exercise Goals and Review: Exercise Goals    Row Name 09/22/19 1540             Exercise Goals   Increase Physical Activity  Yes       Intervention  Provide advice, education, support and counseling about physical activity/exercise needs.;Develop an individualized exercise prescription for aerobic and resistive training based on initial evaluation findings, risk stratification, comorbidities and participant's personal goals.       Expected Outcomes  Short Term: Attend rehab on a regular basis to increase amount of physical activity.;Long Term: Add in home exercise to make exercise part of routine and to increase amount of physical activity.;Long  Term: Exercising regularly at least 3-5 days a week.       Increase Strength and Stamina  Yes       Intervention  Provide advice, education, support and counseling about physical activity/exercise needs.;Develop an individualized exercise prescription for aerobic and resistive training based on initial evaluation findings, risk stratification, comorbidities and participant's personal goals.       Expected Outcomes  Short Term: Increase workloads from initial exercise prescription for resistance, speed, and METs.;Short Term: Perform resistance training exercises routinely during rehab and add in resistance training at home;Long Term: Improve cardiorespiratory fitness, muscular endurance and strength as measured by increased METs and functional capacity (6MWT)       Able to understand  and use rate of perceived exertion (RPE) scale  Yes       Intervention  Provide education and explanation on how to use RPE scale       Expected Outcomes  Short Term: Able to use RPE daily in rehab to express subjective intensity level;Long Term:  Able to use RPE to guide intensity level when exercising independently       Able to understand and use Dyspnea scale  Yes       Intervention  Provide education and explanation on how to use Dyspnea scale       Expected Outcomes  Short Term: Able to use Dyspnea scale daily in rehab to express subjective sense of shortness of breath during exertion;Long Term: Able to use Dyspnea scale to guide intensity level when exercising independently       Knowledge and understanding of Target Heart Rate Range (THRR)  Yes       Intervention  Provide education and explanation of THRR including how the numbers were predicted and where they are located for reference       Expected Outcomes  Short Term: Able to state/look up THRR;Short Term: Able to use daily as guideline for intensity in rehab;Long Term: Able to use THRR to govern intensity when exercising independently       Able to check pulse independently  Yes       Intervention  Provide education and demonstration on how to check pulse in carotid and radial arteries.;Review the importance of being able to check your own pulse for safety during independent exercise       Expected Outcomes  Short Term: Able to explain why pulse checking is important during independent exercise;Long Term: Able to check pulse independently and accurately       Understanding of Exercise Prescription  Yes       Intervention  Provide education, explanation, and written materials on patient's individual exercise prescription       Expected Outcomes  Short Term: Able to explain program exercise prescription;Long Term: Able to explain home exercise prescription to exercise independently          Copy of goals given to participant.

## 2019-09-22 NOTE — Progress Notes (Signed)
Pulmonary Individual Treatment Plan  Patient Details  Name: Ann Flynn MRN: [id number] Date of Birth: 1940/11/01 Referring Provider:     Pulmonary Rehab from 09/22/2019 in [facility] Cardiac and Pulmonary Rehab  Referring Provider  Glori Bickers MD      Initial Encounter Date:    Pulmonary Rehab from 09/22/2019 in [facility] Cardiac and Pulmonary Rehab  Date  09/22/19      Visit Diagnosis: Pulmonary hypertension ([place])  Patient's Home Medications on Admission:  Current Outpatient Medications:  .  acetaminophen (TYLENOL) 500 MG tablet, Take 500 mg by mouth 2 (two) times daily as needed for moderate pain., Disp: , Rfl:  .  ambrisentan (LETAIRIS) 5 MG tablet, Take 1 tablet (5 mg total) by mouth daily. (Patient taking differently: Take 5 mg by mouth every evening. ), Disp: 90 tablet, Rfl: 1 .  amLODipine (NORVASC) 5 MG tablet, Take 1 tablet (5 mg total) by mouth daily. (Patient taking differently: Take 5 mg by mouth every evening. ), Disp: 30 tablet, Rfl: 6 .  atorvastatin (LIPITOR) 80 MG tablet, Take 1 tablet (80 mg total) by mouth daily. (Patient taking differently: Take 80 mg by mouth at bedtime. ), Disp: 90 tablet, Rfl: 0 .  BD PEN NEEDLE NANO U/F 32G X 4 MM MISC, USE TO INJECT ONCE DAILY AS DIRECTED, Disp: 100 each, Rfl: 0 .  Cyanocobalamin (B-12 PO), Take 1 tablet by mouth daily., Disp: , Rfl:  .  docusate sodium (COLACE) 100 MG capsule, Take 100 mg by mouth at bedtime., Disp: , Rfl:  .  Dulaglutide (TRULICITY) 9.45 WT/8.8EK SOPN, Inject 0.75 mg into the skin once a week. (Patient taking differently: Inject 0.75 mg into the skin every Monday. ), Disp: 4 pen, Rfl: 0 .  furosemide (LASIX) 40 MG tablet, Take 40 mg by mouth daily. , Disp: , Rfl:  .  Insulin Glargine, 1 Unit Dial, (TOUJEO SOLOSTAR) 300 UNIT/ML SOPN, Inject 50 Units into the skin daily. 50 units daily. (Patient taking differently: Inject 40-50 Units into the skin at bedtime. ), Disp: 6 pen, Rfl: 2 .  JANUVIA 25 MG tablet, TAKE ONE  TABLET BY MOUTH EVERY DAY (Patient taking differently: Take 25 mg by mouth daily. ), Disp: 90 tablet, Rfl: 0 .  losartan (COZAAR) 100 MG tablet, TAKE 1 TABLET BY MOUTH DAILY, Disp: 30 tablet, Rfl: 2 .  metoprolol tartrate (LOPRESSOR) 25 MG tablet, Take 1 tablet (25 mg total) by mouth 2 (two) times daily., Disp: 90 tablet, Rfl: 1 .  omeprazole (PRILOSEC) 40 MG capsule, TAKE 1 CAPSULE BY MOUTH DAILY, Disp: 30 capsule, Rfl: 2 .  tadalafil (CIALIS) 20 MG tablet, Take 2 tablets (40 mg total) by mouth daily. (Patient taking differently: Take 40 mg by mouth at bedtime. ), Disp: 180 tablet, Rfl: 1 .  venlafaxine (EFFEXOR) 75 MG tablet, Take 1 tablet (75 mg total) by mouth daily., Disp: 90 tablet, Rfl: 1 .  VITAMIN D PO, Take 1 capsule by mouth daily., Disp: , Rfl:  .  warfarin (COUMADIN) 4 MG tablet, Take 2-4 mg by mouth See admin instructions. Take 4 mg at night on Sun, Tue, Thurs, and Sat, take 2 mg at night on Mon, Wed, and Fri, Disp: , Rfl:   Past Medical History: No past medical history on file.  Tobacco Use: Social History   Tobacco Use  Smoking Status Never Smoker  Smokeless Tobacco Never Used    Labs: Recent Review Scientist, physiological    Labs for ITP Cardiac and Pulmonary Rehab  Latest Ref Rng & Units 03/28/2019 07/06/2019 07/29/2019 07/29/2019 07/29/2019   Cholestrol 100 - 199 mg/dL 186 - - - -   LDLCALC 0 - 99 mg/dL 95 - - - -   HDL >39 mg/dL 43 - - - -   Trlycerides 0 - 149 mg/dL 240(H) - - - -   Hemoglobin A1c 4.0 - 5.6 % 7.2(H) 7.3(A) - - -   HCO3 20.0 - 28.0 mmol/L - - 26.3 26.7 26.9   TCO2 22 - 32 mmol/L - - _0 O2SAT % - - 75.0 74.0 72.0       Pulmonary Assessment Scores: Pulmonary Assessment Scores    Row Name 09/22/19 1543         ADL UCSD   ADL Phase  Entry     SOB Score total  42     Rest  0     Walk  2     Stairs  5     Bath  1     Dress  1     Shop  2       CAT Score   CAT Score  22       mMRC Score   mMRC Score  2         UCSD: Self-administered rating of dyspnea associated with activities of daily living (ADLs) 6-point scale (0 = "not at all" to 5 = "maximal or unable to do because of breathlessness")  Scoring Scores range from 0 to 120.  Minimally important difference is 5 units  CAT: CAT can identify the health impairment of COPD patients and is better correlated with disease progression.  CAT has a scoring range of zero to 40. The CAT score is classified into four groups of low (less than 10), medium (10 - 20), high (21-30) and very high (31-40) based on the impact level of disease on health status. A CAT score over 10 suggests significant symptoms.  A worsening CAT score could be explained by an exacerbation, poor medication adherence, poor inhaler technique, or progression of COPD or comorbid conditions.  CAT MCID is 2 points  mMRC: mMRC (Modified Medical Research Council) Dyspnea Scale is used to assess the degree of baseline functional disability in patients of respiratory disease due to dyspnea. No minimal important difference is established. A decrease in score of 1 point or greater is considered a positive change.   Pulmonary Function Assessment: Pulmonary Function Assessment - 09/22/19 1543      Breath   Shortness of Breath  Yes;Fear of Shortness of Breath;Limiting activity       Exercise Target Goals: Exercise Program Goal: Individual exercise prescription set using results from initial 6 min walk test and THRR while considering  patient's activity barriers and safety.   Exercise Prescription Goal: Initial exercise prescription builds to 30-45 minutes a day of aerobic activity, 2-3 days per week.  Home exercise guidelines will be given to patient during program as part of exercise prescription that the participant will acknowledge.  Activity Barriers & Risk Stratification: Activity Barriers & Cardiac Risk Stratification - 09/21/19 0808      Activity Barriers & Cardiac Risk Stratification    Activity Barriers  Arthritis;Joint Problems;Other (comment);Deconditioning;Muscular Weakness;Shortness of Breath;Balance Concerns    Comments  Right leg/hip flaring up occassionally (possible sciattica)       6 Minute Walk: 6 Minute Walk    Row Name 09/22/19 1534         6 Minute  Walk   Phase  Initial     Distance  400 feet     Walk Time  5 minutes     # of Rest Breaks  0 stopped at 5 min     MPH  0.91     METS  0.56     RPE  17     Perceived Dyspnea   3     VO2 Peak  1.95     Symptoms  Yes (comment)     Comments  dizzy, hips hurting 8/10     Resting HR  62 bpm     Resting BP  134/72     Resting Oxygen Saturation   99 %     Exercise Oxygen Saturation  during 6 min walk  86 %     Max Ex. HR  120 bpm     Max Ex. BP  152/84     2 Minute Post BP  134/74       Interval HR   1 Minute HR  95     2 Minute HR  113     3 Minute HR  117     4 Minute HR  119     5 Minute HR  120     6 Minute HR  111     2 Minute Post HR  87     Interval Heart Rate?  Yes       Interval Oxygen   Interval Oxygen?  Yes     Baseline Oxygen Saturation %  99 %     1 Minute Oxygen Saturation %  95 %     1 Minute Liters of Oxygen  3 L     2 Minute Oxygen Saturation %  94 %     2 Minute Liters of Oxygen  3 L     3 Minute Oxygen Saturation %  91 %     3 Minute Liters of Oxygen  3 L     4 Minute Oxygen Saturation %  85 %     4 Minute Liters of Oxygen  3 L     5 Minute Oxygen Saturation %  86 %     5 Minute Liters of Oxygen  3 L     6 Minute Oxygen Saturation %  90 %     6 Minute Liters of Oxygen  3 L     2 Minute Post Oxygen Saturation %  98 %     2 Minute Post Liters of Oxygen  3 L       Oxygen Initial Assessment: Oxygen Initial Assessment - 09/21/19 0811      Home Oxygen   Home Oxygen Device  Home Concentrator;E-Tanks;Portable Concentrator   just approved for portable concentrator   Sleep Oxygen Prescription  CPAP;Continuous    Liters per minute  2   11 setting   Home Exercise Oxygen  Prescription  Continuous   new concentrator will be pulsed   Liters per minute  3    Home at Rest Exercise Oxygen Prescription  Continuous    Liters per minute  2.5    Compliance with Home Oxygen Use  Yes      Initial 6 min Walk   Oxygen Used  Continuous;E-Tanks   may want to test new concentrator once it arrives   Liters per minute  3      Program Oxygen Prescription   Program Oxygen Prescription  Continuous;E-Tanks    Liters  per minute  3      Intervention   Short Term Goals  To learn and exhibit compliance with exercise, home and travel O2 prescription;To learn and understand importance of maintaining oxygen saturations>88%;To learn and understand importance of monitoring SPO2 with pulse oximeter and demonstrate accurate use of the pulse oximeter.;To learn and demonstrate proper pursed lip breathing techniques or other breathing techniques.;To learn and demonstrate proper use of respiratory medications   has pulse oximeter   Long  Term Goals  Exhibits compliance with exercise, home and travel O2 prescription;Verbalizes importance of monitoring SPO2 with pulse oximeter and return demonstration;Maintenance of O2 saturations>88%;Exhibits proper breathing techniques, such as pursed lip breathing or other method taught during program session;Compliance with respiratory medication;Demonstrates proper use of MDI's       Oxygen Re-Evaluation:   Oxygen Discharge (Final Oxygen Re-Evaluation):   Initial Exercise Prescription: Initial Exercise Prescription - 09/22/19 1500      Date of Initial Exercise RX and Referring Provider   Date  09/22/19    Referring Provider  Bensimhon, Daniel MD      Oxygen   Oxygen  Continuous    Liters  3      Treadmill   MPH  0.8    Grade  0    Minutes  15    METs  1.6      NuStep   Level  1    SPM  80    Minutes  15    METs  1.5      T5 Nustep   Level  1    SPM  80    Minutes  15    METs  1.5      Biostep-RELP   Level  1    SPM  50     Minutes  15    METs  1      Prescription Details   Frequency (times per week)  2    Duration  Progress to 30 minutes of continuous aerobic without signs/symptoms of physical distress      Intensity   THRR 40-80% of Max Heartrate  94-126    Ratings of Perceived Exertion  11-13    Perceived Dyspnea  0-4      Progression   Progression  Continue to progress workloads to maintain intensity without signs/symptoms of physical distress.      Resistance Training   Training Prescription  Yes    Weight  3 lb    Reps  10-15       Perform Capillary Blood Glucose checks as needed.  Exercise Prescription Changes: Exercise Prescription Changes    Row Name 09/22/19 1500             Response to Exercise   Blood Pressure (Admit)  134/72       Blood Pressure (Exercise)  152/84       Blood Pressure (Exit)  126/72       Heart Rate (Admit)  62 bpm       Heart Rate (Exercise)  120 bpm       Heart Rate (Exit)  64 bpm       Oxygen Saturation (Admit)  99 %       Oxygen Saturation (Exercise)  85 %       Oxygen Saturation (Exit)  97 %       Rating of Perceived Exertion (Exercise)  17       Perceived Dyspnea (Exercise)  3  Symptoms  dizzy, hip hurting 8/10       Comments  walk test results          Exercise Comments:   Exercise Goals and Review: Exercise Goals    Row Name 09/22/19 1540             Exercise Goals   Increase Physical Activity  Yes       Intervention  Provide advice, education, support and counseling about physical activity/exercise needs.;Develop an individualized exercise prescription for aerobic and resistive training based on initial evaluation findings, risk stratification, comorbidities and participant's personal goals.       Expected Outcomes  Short Term: Attend rehab on a regular basis to increase amount of physical activity.;Long Term: Add in home exercise to make exercise part of routine and to increase amount of physical activity.;Long Term: Exercising  regularly at least 3-5 days a week.       Increase Strength and Stamina  Yes       Intervention  Provide advice, education, support and counseling about physical activity/exercise needs.;Develop an individualized exercise prescription for aerobic and resistive training based on initial evaluation findings, risk stratification, comorbidities and participant's personal goals.       Expected Outcomes  Short Term: Increase workloads from initial exercise prescription for resistance, speed, and METs.;Short Term: Perform resistance training exercises routinely during rehab and add in resistance training at home;Long Term: Improve cardiorespiratory fitness, muscular endurance and strength as measured by increased METs and functional capacity (6MWT)       Able to understand and use rate of perceived exertion (RPE) scale  Yes       Intervention  Provide education and explanation on how to use RPE scale       Expected Outcomes  Short Term: Able to use RPE daily in rehab to express subjective intensity level;Long Term:  Able to use RPE to guide intensity level when exercising independently       Able to understand and use Dyspnea scale  Yes       Intervention  Provide education and explanation on how to use Dyspnea scale       Expected Outcomes  Short Term: Able to use Dyspnea scale daily in rehab to express subjective sense of shortness of breath during exertion;Long Term: Able to use Dyspnea scale to guide intensity level when exercising independently       Knowledge and understanding of Target Heart Rate Range (THRR)  Yes       Intervention  Provide education and explanation of THRR including how the numbers were predicted and where they are located for reference       Expected Outcomes  Short Term: Able to state/look up THRR;Short Term: Able to use daily as guideline for intensity in rehab;Long Term: Able to use THRR to govern intensity when exercising independently       Able to check pulse independently  Yes        Intervention  Provide education and demonstration on how to check pulse in carotid and radial arteries.;Review the importance of being able to check your own pulse for safety during independent exercise       Expected Outcomes  Short Term: Able to explain why pulse checking is important during independent exercise;Long Term: Able to check pulse independently and accurately       Understanding of Exercise Prescription  Yes       Intervention  Provide education, explanation, and written materials on patient's individual exercise  prescription       Expected Outcomes  Short Term: Able to explain program exercise prescription;Long Term: Able to explain home exercise prescription to exercise independently          Exercise Goals Re-Evaluation :   Discharge Exercise Prescription (Final Exercise Prescription Changes): Exercise Prescription Changes - 09/22/19 1500      Response to Exercise   Blood Pressure (Admit)  134/72    Blood Pressure (Exercise)  152/84    Blood Pressure (Exit)  126/72    Heart Rate (Admit)  62 bpm    Heart Rate (Exercise)  120 bpm    Heart Rate (Exit)  64 bpm    Oxygen Saturation (Admit)  99 %    Oxygen Saturation (Exercise)  85 %    Oxygen Saturation (Exit)  97 %    Rating of Perceived Exertion (Exercise)  17    Perceived Dyspnea (Exercise)  3    Symptoms  dizzy, hip hurting 8/10    Comments  walk test results       Nutrition:  Target Goals: Understanding of nutrition guidelines, daily intake of sodium <1576m, cholesterol <2067m calories 30% from fat and 7% or less from saturated fats, daily to have 5 or more servings of fruits and vegetables.  Biometrics: Pre Biometrics - 09/22/19 1541      Pre Biometrics   Height  5' 6.5" (1.689 m)    Weight  250 lb 12.8 oz (113.8 kg)    BMI (Calculated)  39.88    Single Leg Stand  1.53 seconds        Nutrition Therapy Plan and Nutrition Goals:   Nutrition Assessments: Nutrition Assessments - 09/22/19 1542       MEDFICTS Scores   Pre Score  56       Nutrition Goals Re-Evaluation:   Nutrition Goals Discharge (Final Nutrition Goals Re-Evaluation):   Psychosocial: Target Goals: Acknowledge presence or absence of significant depression and/or stress, maximize coping skills, provide positive support system. Participant is able to verbalize types and ability to use techniques and skills needed for reducing stress and depression.   Initial Review & Psychosocial Screening: Initial Psych Review & Screening - 09/21/19 0817      Initial Review   Current issues with  Current Depression;History of Depression;Current Psychotropic Meds;Current Stress Concerns    Source of Stress Concerns  Chronic Illness;Family;Retirement/disability    Comments  History of depression from loss of husband six years ago and started taking Elavil and she feels like her meds are working.  She recently moved down here about 7 months ago and the pandemic has made it hard to get acquainted.  She has two kids, daughter in [place]     Family Dynamics   Good Support System?  Yes   Son and daughter are support system and live near by, hard to make new friends   Comments  Lost husband six years ago and just moved here 7 months ago      Barriers   Psychosocial barriers to participate in program  The patient should benefit from training in stress management and relaxation.;Psychosocial barriers identified (see note)      Screening Interventions   Interventions  Encouraged to exercise;Program counselor consult;To provide support and resources with identified psychosocial needs;Provide feedback about the scores to participant    Expected Outcomes  Short Term goal: Utilizing psychosocial counselor, staff and physician to assist with identification of specific Stressors or current issues  interfering with healing process. Setting desired goal for each stressor or current issue identified.;Long Term Goal:  Stressors or current issues are controlled or eliminated.;Short Term goal: Identification and review with participant of any Quality of Life or Depression concerns found by scoring the questionnaire.;Long Term goal: The participant improves quality of Life and PHQ9 Scores as seen by post scores and/or verbalization of changes       Quality of Life Scores:  Scores of 19 and below usually indicate a poorer quality of life in these areas.  A difference of  2-3 points is a clinically meaningful difference.  A difference of 2-3 points in the total score of the Quality of Life Index has been associated with significant improvement in overall quality of life, self-image, physical symptoms, and general health in studies assessing change in quality of life.  PHQ-9: Recent Review Flowsheet Data    Depression screen [facility] 2/9 09/22/2019 03/28/2019   Decreased Interest 1 0   Down, Depressed, Hopeless 1 1   PHQ - 2 Score 2 1   Altered sleeping 2 -   Tired, decreased energy 3 -   Change in appetite 2 -   Feeling bad or failure about yourself  2 -   Trouble concentrating 0 -   Moving slowly or fidgety/restless 0 -   Suicidal thoughts 0 -   PHQ-9 Score 11 -   Difficult doing work/chores Somewhat difficult -     Interpretation of Total Score  Total Score Depression Severity:  1-4 = Minimal depression, 5-9 = Mild depression, 10-14 = Moderate depression, 15-19 = Moderately severe depression, 20-27 = Severe depression   Psychosocial Evaluation and Intervention: Psychosocial Evaluation - 09/21/19 0831      Psychosocial Evaluation & Interventions   Interventions  Encouraged to exercise with the program and follow exercise prescription    Comments  Ms. Mehl is coming into Pulmonary Rehab with history of pulmonary hypertension, ILD, and heart failure.  She moved to the area seven months ago and has found it hard to settle in and make friends during the pandemic.  Her kids live close by (daughter in  [place] and son in [place]  She lost her husband six years ago and decided it was time to be closer to her kids. She really wants to get into a routine and start moving more as the pandemic has turned her into a couch potato.  She is currently needing to be wheeled around at the hospital but we are going to work with her to be able to walk around on her own by building strength and stamina.  Coming to class will also offer her a social outlet and chance to meet some people in the area.  We look forward to working with her.    Expected Outcomes  Short: Build up strength and stamina for improved mobility.  Long: Make adjustments to living in the area.    Continue Psychosocial Services   Follow up required by staff       Psychosocial Re-Evaluation:   Psychosocial Discharge (Final Psychosocial Re-Evaluation):   Education: Education Goals: Education classes will be provided on a weekly basis, covering required topics. Participant will state understanding/return demonstration of topics presented.  Learning Barriers/Preferences: Learning Barriers/Preferences - 09/21/19 9476      Learning Barriers/Preferences   Learning Barriers  Sight   glasses   Learning Preferences  None       Education Topics:  Initial Evaluation Education: - Verbal, written and demonstration  of respiratory meds, oximetry and breathing techniques. Instruction on use of nebulizers and MDIs and importance of monitoring MDI activations.   Pulmonary Rehab from 09/22/2019 in [facility] Cardiac and Pulmonary Rehab  Date  09/22/19  Educator  [facility]  Instruction Review Code  1- Verbalizes Understanding      General Nutrition Guidelines/Fats and Fiber: -Group instruction provided by verbal, written material, models and posters to present the general guidelines for heart healthy nutrition. Gives an explanation and review of dietary fats and fiber.   Controlling Sodium/Reading Food Labels: -Group verbal and written material  supporting the discussion of sodium use in heart healthy nutrition. Review and explanation with models, verbal and written materials for utilization of the food label.   Exercise Physiology & General Exercise Guidelines: - Group verbal and written instruction with models to review the exercise physiology of the cardiovascular system and associated critical values. Provides general exercise guidelines with specific guidelines to those with heart or lung disease.    Aerobic Exercise & Resistance Training: - Gives group verbal and written instruction on the various components of exercise. Focuses on aerobic and resistive training programs and the benefits of this training and how to safely progress through these programs.   Flexibility, Balance, Mind/Body Relaxation: Provides group verbal/written instruction on the benefits of flexibility and balance training, including mind/body exercise modes such as yoga, pilates and tai chi.  Demonstration and skill practice provided.   Stress and Anxiety: - Provides group verbal and written instruction about the health risks of elevated stress and causes of high stress.  Discuss the correlation between heart/lung disease and anxiety and treatment options. Review healthy ways to manage with stress and anxiety.   Depression: - Provides group verbal and written instruction on the correlation between heart/lung disease and depressed mood, treatment options, and the stigmas associated with seeking treatment.   Exercise & Equipment Safety: - Individual verbal instruction and demonstration of equipment use and safety with use of the equipment.   Pulmonary Rehab from 09/22/2019 in [facility] Cardiac and Pulmonary Rehab  Date  09/22/19  Educator  [facility]  Instruction Review Code  1- Verbalizes Understanding      Infection Prevention: - Provides verbal and written material to individual with discussion of infection control including proper hand washing and proper  equipment cleaning during exercise session.   Pulmonary Rehab from 09/22/2019 in [facility] Cardiac and Pulmonary Rehab  Date  09/22/19  Educator  [facility]  Instruction Review Code  1- Verbalizes Understanding      Falls Prevention: - Provides verbal and written material to individual with discussion of falls prevention and safety.   Pulmonary Rehab from 09/22/2019 in [facility] Cardiac and Pulmonary Rehab  Date  09/22/19  Educator  [facility]  Instruction Review Code  1- Verbalizes Understanding      Diabetes: - Individual verbal and written instruction to review signs/symptoms of diabetes, desired ranges of glucose level fasting, after meals and with exercise. Advice that pre and post exercise glucose checks will be done for 3 sessions at entry of program.   Chronic Lung Diseases: - Group verbal and written instruction to review updates, respiratory medications, advancements in procedures and treatments. Discuss use of supplemental oxygen including available portable oxygen systems, continuous and intermittent flow rates, concentrators, personal use and safety guidelines. Review proper use of inhaler and spacers. Provide informative websites for self-education.    Energy Conservation: - Provide group verbal and written instruction for methods to conserve energy, plan and organize activities. Instruct on  pacing techniques, use of adaptive equipment and posture/positioning to relieve shortness of breath.   Triggers and Exacerbations: - Group verbal and written instruction to review types of environmental triggers and ways to prevent exacerbations. Discuss weather changes, air quality and the benefits of nasal washing. Review warning signs and symptoms to help prevent infections. Discuss techniques for effective airway clearance, coughing, and vibrations.   AED/CPR: - Group verbal and written instruction with the use of models to demonstrate the basic use of the AED with the basic ABC's of  resuscitation.   Anatomy and Physiology of the Lungs: - Group verbal and written instruction with the use of models to provide basic lung anatomy and physiology related to function, structure and complications of lung disease.   Anatomy & Physiology of the Heart: - Group verbal and written instruction and models provide basic cardiac anatomy and physiology, with the coronary electrical and arterial systems. Review of Valvular disease and Heart Failure   Cardiac Medications: - Group verbal and written instruction to review commonly prescribed medications for heart disease. Reviews the medication, class of the drug, and side effects.   Know Your Numbers and Risk Factors: -Group verbal and written instruction about important numbers in your health.  Discussion of what are risk factors and how they play a role in the disease process.  Review of Cholesterol, Blood Pressure, Diabetes, and BMI and the role they play in your overall health.   Sleep Hygiene: -Provides group verbal and written instruction about how sleep can affect your health.  Define sleep hygiene, discuss sleep cycles and impact of sleep habits. Review good sleep hygiene tips.    Other: -Provides group and verbal instruction on various topics (see comments)    Knowledge Questionnaire Score: Knowledge Questionnaire Score - 09/22/19 1542      Knowledge Questionnaire Score   Pre Score  18/18        Core Components/Risk Factors/Patient Goals at Admission: Personal Goals and Risk Factors at Admission - 09/21/19 0856      Core Components/Risk Factors/Patient Goals on Admission    Weight Management  Yes;Weight Loss    Intervention  Weight Management: Develop a combined nutrition and exercise program designed to reach desired caloric intake, while maintaining appropriate intake of nutrient and fiber, sodium and fats, and appropriate energy expenditure required for the weight goal.;Weight Management: Provide education and  appropriate resources to help participant work on and attain dietary goals.    Expected Outcomes  Short Term: Continue to assess and modify interventions until short term weight is achieved;Long Term: Adherence to nutrition and physical activity/exercise program aimed toward attainment of established weight goal;Weight Loss: Understanding of general recommendations for a balanced deficit meal plan, which promotes 1-2 lb weight loss per week and includes a negative energy balance of [phone] kcal/d;Understanding recommendations for meals to include 15-35% energy as protein, 25-35% energy from fat, 35-60% energy from carbohydrates, less than 268m of dietary cholesterol, 20-35 gm of total fiber daily;Understanding of distribution of calorie intake throughout the day with the consumption of 4-5 meals/snacks    Diabetes  Yes    Intervention  Provide education about signs/symptoms and action to take for hypo/hyperglycemia.;Provide education about proper nutrition, including hydration, and aerobic/resistive exercise prescription along with prescribed medications to achieve blood glucose in normal ranges: Fasting glucose 65-99 mg/dL    Expected Outcomes  Short Term: Participant verbalizes understanding of the signs/symptoms and immediate care of hyper/hypoglycemia, proper foot care and importance of medication, aerobic/resistive exercise and nutrition  plan for blood glucose control.;Long Term: Attainment of HbA1C < 7%.    Heart Failure  Yes    Intervention  Provide a combined exercise and nutrition program that is supplemented with education, support and counseling about heart failure. Directed toward relieving symptoms such as shortness of breath, decreased exercise tolerance, and extremity edema.    Expected Outcomes  Improve functional capacity of life;Short term: Attendance in program 2-3 days a week with increased exercise capacity. Reported lower sodium intake. Reported increased fruit and vegetable intake.  Reports medication compliance.;Short term: Daily weights obtained and reported for increase. Utilizing diuretic protocols set by physician.;Long term: Adoption of self-care skills and reduction of barriers for early signs and symptoms recognition and intervention leading to self-care maintenance.    Hypertension  Yes    Intervention  Provide education on lifestyle modifcations including regular physical activity/exercise, weight management, moderate sodium restriction and increased consumption of fresh fruit, vegetables, and low fat dairy, alcohol moderation, and smoking cessation.;Monitor prescription use compliance.    Expected Outcomes  Short Term: Continued assessment and intervention until BP is < 140/50m HG in hypertensive participants. < 130/868mHG in hypertensive participants with diabetes, heart failure or chronic kidney disease.;Long Term: Maintenance of blood pressure at goal levels.    Lipids  Yes    Intervention  Provide education and support for participant on nutrition & aerobic/resistive exercise along with prescribed medications to achieve LDL <7011mHDL >89m84m  Expected Outcomes  Short Term: Participant states understanding of desired cholesterol values and is compliant with medications prescribed. Participant is following exercise prescription and nutrition guidelines.;Long Term: Cholesterol controlled with medications as prescribed, with individualized exercise RX and with personalized nutrition plan. Value goals: LDL < 70mg67mL > 40 mg.       Core Components/Risk Factors/Patient Goals Review:    Core Components/Risk Factors/Patient Goals at Discharge (Final Review):    ITP Comments: ITP Comments    Row Name 09/21/19 0857 09/22/19 1534         ITP Comments  Completed virtual orientation today.  EP eval scheduled for 2/4 at 930. [facility]  Completed 6MWT and gym orientation.  Initial ITP created and sent for review  to Dr. Mark Emily Filbertical Director.         Comments: Initial ITP

## 2019-09-26 ENCOUNTER — Other Ambulatory Visit: Payer: Self-pay

## 2019-09-26 ENCOUNTER — Encounter (HOSPITAL_COMMUNITY): Payer: Medicare Other | Admitting: Internal Medicine

## 2019-09-26 DIAGNOSIS — I272 Pulmonary hypertension, unspecified: Secondary | ICD-10-CM

## 2019-09-26 NOTE — Progress Notes (Signed)
Completed Initial RD Eval 

## 2019-09-27 ENCOUNTER — Other Ambulatory Visit: Payer: Self-pay

## 2019-09-27 ENCOUNTER — Encounter: Payer: Medicare Other | Admitting: *Deleted

## 2019-09-27 DIAGNOSIS — I270 Primary pulmonary hypertension: Secondary | ICD-10-CM | POA: Diagnosis not present

## 2019-09-27 DIAGNOSIS — I272 Pulmonary hypertension, unspecified: Secondary | ICD-10-CM

## 2019-09-27 LAB — GLUCOSE, CAPILLARY
Glucose-Capillary: 120 mg/dL — ABNORMAL HIGH (ref 70–99)
Glucose-Capillary: 104 mg/dL — ABNORMAL HIGH (ref 70–99)

## 2019-09-27 NOTE — Progress Notes (Signed)
Daily Session Note  Patient Details  Name: Ann Flynn MRN: [id number] Date of Birth: 1941/02/24 Referring Provider:     Pulmonary Rehab from 09/22/2019 in [facility] Cardiac and Pulmonary Rehab  Referring Provider  Glori Bickers MD      Encounter Date: 09/27/2019  Check In: Session Check In - 09/27/19 1118      Check-In   Supervising physician immediately available to respond to emergencies  See telemetry face sheet for immediately available ER MD    Location  [facility]-Cardiac & Pulmonary Rehab    Staff Present  Alberteen Sam, MA, RCEP, CCRP, CCET;Melissa Caiola RDN, LDN;Kaleeyah Cuffie Sherryll Burger, RN BSN    Virtual Visit  No    Medication changes reported      No    Fall or balance concerns reported     No    Warm-up and Cool-down  Performed on first and last piece of equipment    Resistance Training Performed  Yes    VAD Patient?  No    PAD/SET Patient?  No      Pain Assessment   Currently in Pain?  No/denies          Social History   Tobacco Use  Smoking Status Never Smoker  Smokeless Tobacco Never Used    Goals Met:  Proper associated with RPD/PD & O2 Sat Independence with exercise equipment Using PLB without cueing & demonstrates good technique Exercise tolerated well No report of cardiac concerns or symptoms Strength training completed today  Goals Unmet:  Not Applicable  Comments: First full day of exercise!  Patient was oriented to gym and equipment including functions, settings, policies, and procedures.  Patient's individual exercise prescription and treatment plan were reviewed.  All starting workloads were established based on the results of the 6 minute walk test done at initial orientation visit.  The plan for exercise progression was also introduced and progression will be customized based on patient's performance and goals.    Dr. Emily Filbert is Medical Director for [place] and [facility].

## 2019-09-28 LAB — HM DIABETES EYE EXAM

## 2019-10-04 ENCOUNTER — Ambulatory Visit (INDEPENDENT_AMBULATORY_CARE_PROVIDER_SITE_OTHER): Payer: Medicare Other

## 2019-10-04 ENCOUNTER — Other Ambulatory Visit: Payer: Self-pay | Admitting: Physician Assistant

## 2019-10-04 ENCOUNTER — Other Ambulatory Visit: Payer: Self-pay

## 2019-10-04 ENCOUNTER — Encounter: Payer: Medicare Other | Admitting: *Deleted

## 2019-10-04 DIAGNOSIS — Z8673 Personal history of transient ischemic attack (TIA), and cerebral infarction without residual deficits: Secondary | ICD-10-CM

## 2019-10-04 DIAGNOSIS — I272 Pulmonary hypertension, unspecified: Secondary | ICD-10-CM

## 2019-10-04 DIAGNOSIS — I270 Primary pulmonary hypertension: Secondary | ICD-10-CM | POA: Diagnosis not present

## 2019-10-04 LAB — GLUCOSE, CAPILLARY
Glucose-Capillary: 151 mg/dL — ABNORMAL HIGH (ref 70–99)
Glucose-Capillary: 119 mg/dL — ABNORMAL HIGH (ref 70–99)

## 2019-10-04 LAB — POCT INR
INR: 1.4 — AB (ref 2.0–3.0)
PT: 17.1

## 2019-10-04 MED ORDER — WARFARIN SODIUM 3 MG PO TABS: ORAL_TABLET | ORAL | 0 refills | Status: DC

## 2019-10-04 NOTE — Progress Notes (Signed)
Daily Session Note  Patient Details  Name: Ann Flynn MRN: [id number] Date of Birth: 10/30/1940 Referring Provider:     Pulmonary Rehab from 09/22/2019 in [facility] Cardiac and Pulmonary Rehab  Referring Provider  Bensimhon, Daniel MD      Encounter Date: 10/04/2019  Check In: Session Check In - 10/04/19 1110      Check-In   Supervising physician immediately available to respond to emergencies  See telemetry face sheet for immediately available ER MD    Location  [facility]-Cardiac & Pulmonary Rehab    Staff Present   , RN BSN;Jessica Hawkins, MA, RCEP, CCRP, CCET;Melissa Caiola RDN, LDN;Amanda Sommer, BA, ACSM CEP, Exercise Physiologist    Virtual Visit  No    Medication changes reported      No    Fall or balance concerns reported     No    Warm-up and Cool-down  Performed on first and last piece of equipment    Resistance Training Performed  Yes    VAD Patient?  No    PAD/SET Patient?  No      Pain Assessment   Currently in Pain?  No/denies          Social History   Tobacco Use  Smoking Status Never Smoker  Smokeless Tobacco Never Used    Goals Met:  Independence with exercise equipment Exercise tolerated well No report of cardiac concerns or symptoms Strength training completed today  Goals Unmet:  Not Applicable  Comments: Pt able to follow exercise prescription today without complaint.  Will continue to monitor for progression.    Dr. Mark Miller is Medical Director for [facility] and [facility]. 

## 2019-10-04 NOTE — Patient Instructions (Signed)
Description   Dx: Hx of stroke Current Coumadin Dose: 53m QD,  except 252mMonday, Wednesday and Friday PT: 17.04 INR: 1.4 Today's Changes: 82m81maily except 3 mg M-W-F. Prescription sent to [facility] pharmacy. Recheck: 2 weeks

## 2019-10-05 ENCOUNTER — Other Ambulatory Visit: Payer: Self-pay | Admitting: Physician Assistant

## 2019-10-05 DIAGNOSIS — E119 Type 2 diabetes mellitus without complications: Secondary | ICD-10-CM

## 2019-10-05 NOTE — Telephone Encounter (Signed)
Requested Prescriptions  Pending Prescriptions Disp Refills  . atorvastatin (LIPITOR) 80 MG tablet [Pharmacy Med Name: ATORVASTATIN CALCIUM 80 MG TAB] 90 tablet 0    Sig: TAKE 1 TABLET BY MOUTH DAILY     Cardiovascular:  Antilipid - Statins Failed - 10/05/2019  1:23 PM      Failed - Triglycerides in normal range and within 360 days    Triglycerides  Date Value Ref Range Status  03/28/2019 240 (H) 0 - 149 mg/dL Final         Passed - Total Cholesterol in normal range and within 360 days    Cholesterol, Total  Date Value Ref Range Status  03/28/2019 186 100 - 199 mg/dL Final         Passed - LDL in normal range and within 360 days    LDL Calculated  Date Value Ref Range Status  03/28/2019 95 0 - 99 mg/dL Final         Passed - HDL in normal range and within 360 days    HDL  Date Value Ref Range Status  03/28/2019 43 >39 mg/dL Final         Passed - Patient is not pregnant      Passed - Valid encounter within last 12 months    Recent Outpatient Visits          2 months ago History of stroke   [employer], Adriana M, PA-C   3 months ago Diabetes mellitus without complication [facility])   [place], [place], PA-C   6 months ago Congestive heart failure, unspecified HF chronicity, unspecified heart failure type [facility])   [place], Wendee Beavers, [place]      Future Appointments            Tomorrow Trinna Post, PA-C [facility], [facility]

## 2019-10-05 NOTE — Progress Notes (Signed)
Patient: Ann Flynn Female    DOB: 06/06/1941   78 y.o.   MRN: [id number] Visit Date: 10/06/2019  Today's Provider: Trinna Post, PA-C   Chief Complaint  Patient presents with  . Diabetes   Subjective:    Virtual Visit via Telephone Note  I connected with Ann Flynn on 10/06/19 at 10:40 AM EST by telephone and verified that I am speaking with the correct person using two identifiers.  Location: Patient: Home Provider: Office   I discussed the limitations, risks, security and privacy concerns of performing an evaluation and management service by telephone and the availability of in person appointments. I also discussed with the patient that there may be a patient responsible charge related to this service. The patient expressed understanding and agreed to proceed.  HPI  Diabetes Mellitus Type II, Follow-up:   Lab Results  Component Value Date   HGBA1C 7.3 (A) 07/06/2019   HGBA1C 7.2 (H) 03/28/2019    Last seen for diabetes 3 months ago.  Management since then includes:Dulaglutide (TRULICITY) 0.16 WF/0.9NA SOPN; Inject 0.75 mg into the skin once a week, januvia 25 mg qd, toujeo  She reports excellent compliance with treatment. She is not having side effects.  Current symptoms include none and have been stable. Home blood sugar records: fasting range: 120-140  Episodes of hypoglycemia? no   Current insulin regiment: 40-50 units Most Recent Eye Exam: UTD 09/28/2019 with retinopathy  Weight trend: stable Prior visit with dietician: No Current exercise: cardiovascular workout on exercise equipment and walking Current diet habits: in general, a "healthy" diet    Pertinent Labs:    Component Value Date/Time   CHOL 186 03/28/2019 1611   TRIG 240 (H) 03/28/2019 1611   HDL 43 03/28/2019 1611   LDLCALC 95 03/28/2019 1611   CREATININE 1.99 (H) 07/29/2019 0734    Wt Readings from Last 3 Encounters:  10/06/19 244 lb (110.7 kg)  09/22/19 250 lb 12.8 oz  (113.8 kg)  08/04/19 251 lb (113.9 kg)   CKD:   CMP Latest Ref Rng & Units 07/29/2019 07/29/2019 07/29/2019  Glucose 70 - 99 mg/dL - - -  BUN 8 - 23 mg/dL - - -  Creatinine 0.44 - 1.00 mg/dL - - -  Sodium 135 - 145 mmol/L 144 143 144  Potassium 3.5 - 5.1 mmol/L 3.9 4.0 4.0  Chloride 98 - 111 mmol/L - - -  CO2 22 - 32 mmol/L - - -  Calcium 8.9 - 10.3 mg/dL - - -  Total Protein 6.0 - 8.5 g/dL - - -  Total Bilirubin 0.0 - 1.2 mg/dL - - -  Alkaline Phos 39 - 117 IU/L - - -  AST 0 - 40 IU/L - - -  ALT 0 - 32 IU/L - - -   HTN: amlodipine 5 mg QD, losartan 100 mg QD, metoprolol 25 mg BID without issue.   HLD: Continue lipitor 80 mg daily.  Lipid Panel     Component Value Date/Time   CHOL 186 03/28/2019 1611   TRIG 240 (H) 03/28/2019 1611   HDL 43 03/28/2019 1611   CHOLHDL 4.3 03/28/2019 1611   LDLCALC 95 03/28/2019 1611   LABVLDL 48 (H) 03/28/2019 1611    Got 2 COVID vaccines  ------------------------------------------------------------------------    Allergies  Allergen Reactions  . Penicillins Swelling and Rash    Facial swelling Did it involve swelling of the face/tongue/throat, SOB, or low BP? Yes Did it involve sudden or  severe rash/hives, skin peeling, or any reaction on the inside of your mouth or nose? No Did you need to seek medical attention at a hospital or doctor's office? No When did it last happen?10 + years If all above answers are "NO", may proceed with cephalosporin use.   . Sulfa Antibiotics Rash     Current Outpatient Medications:  .  acetaminophen (TYLENOL) 500 MG tablet, Take 500 mg by mouth 2 (two) times daily as needed for moderate pain., Disp: , Rfl:  .  ambrisentan (LETAIRIS) 5 MG tablet, Take 1 tablet (5 mg total) by mouth daily. (Patient taking differently: Take 5 mg by mouth every evening. ), Disp: 90 tablet, Rfl: 1 .  amLODipine (NORVASC) 5 MG tablet, Take 1 tablet (5 mg total) by mouth daily. (Patient taking differently: Take 5 mg  by mouth every evening. ), Disp: 30 tablet, Rfl: 6 .  atorvastatin (LIPITOR) 80 MG tablet, TAKE 1 TABLET BY MOUTH DAILY, Disp: 90 tablet, Rfl: 0 .  BD PEN NEEDLE NANO U/F 32G X 4 MM MISC, USE TO INJECT ONCE DAILY AS DIRECTED, Disp: 100 each, Rfl: 0 .  Cyanocobalamin (B-12 PO), Take 1 tablet by mouth daily., Disp: , Rfl:  .  docusate sodium (COLACE) 100 MG capsule, Take 100 mg by mouth at bedtime., Disp: , Rfl:  .  Dulaglutide (TRULICITY) 0.03 KJ/1.7HX SOPN, Inject 0.75 mg into the skin once a week. (Patient taking differently: Inject 0.75 mg into the skin every Monday. ), Disp: 4 pen, Rfl: 0 .  furosemide (LASIX) 40 MG tablet, Take 40 mg by mouth daily. , Disp: , Rfl:  .  Insulin Glargine, 1 Unit Dial, (TOUJEO SOLOSTAR) 300 UNIT/ML SOPN, Inject 50 Units into the skin daily. 50 units daily. (Patient taking differently: Inject 40-50 Units into the skin at bedtime. ), Disp: 6 pen, Rfl: 2 .  JANUVIA 25 MG tablet, TAKE ONE TABLET BY MOUTH EVERY DAY, Disp: 90 tablet, Rfl: 0 .  losartan (COZAAR) 100 MG tablet, TAKE 1 TABLET BY MOUTH DAILY, Disp: 30 tablet, Rfl: 2 .  metoprolol tartrate (LOPRESSOR) 25 MG tablet, Take 1 tablet (25 mg total) by mouth 2 (two) times daily., Disp: 90 tablet, Rfl: 1 .  omeprazole (PRILOSEC) 40 MG capsule, TAKE 1 CAPSULE BY MOUTH DAILY, Disp: 30 capsule, Rfl: 2 .  tadalafil (CIALIS) 20 MG tablet, Take 2 tablets (40 mg total) by mouth daily. (Patient taking differently: Take 40 mg by mouth at bedtime. ), Disp: 180 tablet, Rfl: 1 .  venlafaxine (EFFEXOR) 75 MG tablet, Take 1 tablet (75 mg total) by mouth daily., Disp: 90 tablet, Rfl: 1 .  VITAMIN D PO, Take 1 capsule by mouth daily., Disp: , Rfl:  .  warfarin (COUMADIN) 3 MG tablet, Take 3 mg on MWF along with 4 mg on the remaining days., Disp: 30 tablet, Rfl: 0 .  warfarin (COUMADIN) 4 MG tablet, Take 2-4 mg by mouth See admin instructions. Take 4 mg at night on Sun, Tue, Thurs, and Sat, take 2 mg at night on Mon, Wed, and Fri,  Disp: , Rfl:   Review of Systems  Constitutional: Negative for chills and fatigue.  Eyes: Negative for visual disturbance.  Respiratory: Negative for chest tightness and shortness of breath.   Cardiovascular: Negative for chest pain, palpitations and leg swelling.  Neurological: Negative for dizziness, light-headedness and headaches.    Social History   Tobacco Use  . Smoking status: Never Smoker  . Smokeless tobacco: Never Used  Substance  Use Topics  . Alcohol use: Never      Objective:   Ht _0  (1.676 m)   Wt 244 lb (110.7 kg)   BMI 39.38 kg/m  Vitals:   10/06/19 1012  Weight: 244 lb (110.7 kg)  Height: _1  (1.676 m)  Body mass index is 39.38 kg/m.   Physical Exam   No results found for any visits on 10/06/19.     Assessment & Plan    1. Chronic pulmonary embolism, unspecified pulmonary embolism type, unspecified whether acute cor pulmonale present (HCC)  Continue warfarin therapy.   2. Chronic respiratory failure with hypoxia ([place])  Followed by cardiology and pulmonology. On chronic oxygen.   3. Morbid (severe) obesity due to excess calories ([place])   4. Chronic kidney disease, stage 4 (severe) ([place])  Will have her establish with nephrologist.   - Ambulatory referral to Nephrology  5. Type 2 diabetes mellitus without complication, with long-term current use of insulin (HCC)  Continue januvia, trulicity and insulin.  6. Postmenopausal  - DG Bone Density; Future  7. Hyperlipidemia associated with type 2 diabetes mellitus (HCC)  Continue statin.  8. Hypertension associated with diabetes ([place])  Continue current medications.   F/u 3-4 months  I discussed the assessment and treatment plan with the patient. The patient was provided an opportunity to ask questions and all were answered. The patient agreed with the plan and demonstrated an understanding of the instructions.   The patient was advised to call back or seek an in-person evaluation  if the symptoms worsen or if the condition fails to improve as anticipated.       Trinna Post, PA-C  [facility]

## 2019-10-05 NOTE — Telephone Encounter (Signed)
Requested Prescriptions  Pending Prescriptions Disp Refills  . JANUVIA 25 MG tablet [Pharmacy Med Name: JANUVIA 25 MG TAB] 90 tablet 0    Sig: TAKE ONE TABLET BY MOUTH EVERY DAY     Endocrinology:  Diabetes - DPP-4 Inhibitors Failed - 10/05/2019  1:22 PM      Failed - Cr in normal range and within 360 days    Creatinine, Ser  Date Value Ref Range Status  07/29/2019 1.99 (H) 0.44 - 1.00 mg/dL Final         Passed - HBA1C is between 0 and 7.9 and within 180 days    Hemoglobin A1C  Date Value Ref Range Status  07/06/2019 7.3 (A) 4.0 - 5.6 % Final   Hgb A1c MFr Bld  Date Value Ref Range Status  03/28/2019 7.2 (H) 4.8 - 5.6 % Final    Comment:             Prediabetes: 5.7 - 6.4          Diabetes: >6.4          Glycemic control for adults with diabetes: <7.0          Passed - Valid encounter within last 6 months    Recent Outpatient Visits          2 months ago History of stroke   [employer], Adriana M, PA-C   3 months ago Diabetes mellitus without complication [facility])   [place], [place], PA-C   6 months ago Congestive heart failure, unspecified HF chronicity, unspecified heart failure type [facility])   [place], Wendee Beavers, [place]      Future Appointments            Tomorrow Trinna Post, PA-C [facility], [facility]

## 2019-10-06 ENCOUNTER — Other Ambulatory Visit: Payer: Self-pay | Admitting: Physician Assistant

## 2019-10-06 ENCOUNTER — Ambulatory Visit (INDEPENDENT_AMBULATORY_CARE_PROVIDER_SITE_OTHER): Payer: Medicare Other | Admitting: Physician Assistant

## 2019-10-06 ENCOUNTER — Other Ambulatory Visit: Payer: Self-pay

## 2019-10-06 ENCOUNTER — Encounter: Payer: Self-pay | Admitting: Physician Assistant

## 2019-10-06 VITALS — Ht 66.0 in | Wt 244.0 lb

## 2019-10-06 DIAGNOSIS — I129 Hypertensive chronic kidney disease with stage 1 through stage 4 chronic kidney disease, or unspecified chronic kidney disease: Secondary | ICD-10-CM

## 2019-10-06 DIAGNOSIS — E119 Type 2 diabetes mellitus without complications: Secondary | ICD-10-CM

## 2019-10-06 DIAGNOSIS — J9611 Chronic respiratory failure with hypoxia: Secondary | ICD-10-CM | POA: Diagnosis not present

## 2019-10-06 DIAGNOSIS — Z794 Long term (current) use of insulin: Secondary | ICD-10-CM

## 2019-10-06 DIAGNOSIS — I2782 Chronic pulmonary embolism: Secondary | ICD-10-CM | POA: Diagnosis not present

## 2019-10-06 DIAGNOSIS — N184 Chronic kidney disease, stage 4 (severe): Secondary | ICD-10-CM

## 2019-10-06 DIAGNOSIS — E785 Hyperlipidemia, unspecified: Secondary | ICD-10-CM

## 2019-10-06 DIAGNOSIS — E1159 Type 2 diabetes mellitus with other circulatory complications: Secondary | ICD-10-CM

## 2019-10-06 DIAGNOSIS — Z780 Asymptomatic menopausal state: Secondary | ICD-10-CM

## 2019-10-06 DIAGNOSIS — E1169 Type 2 diabetes mellitus with other specified complication: Secondary | ICD-10-CM

## 2019-10-06 NOTE — Patient Instructions (Signed)
Diabetes Mellitus and Exercise Exercising regularly is important for your overall health, especially when you have diabetes (diabetes mellitus). Exercising is not only about losing weight. It has many other health benefits, such as increasing muscle strength and bone density and reducing body fat and stress. This leads to improved fitness, flexibility, and endurance, all of which result in better overall health. Exercise has additional benefits for people with diabetes, including:  Reducing appetite.  Helping to lower and control blood glucose.  Lowering blood pressure.  Helping to control amounts of fatty substances (lipids) in the blood, such as cholesterol and triglycerides.  Helping the body to respond better to insulin (improving insulin sensitivity).  Reducing how much insulin the body needs.  Decreasing the risk for heart disease by: ? Lowering cholesterol and triglyceride levels. ? Increasing the levels of good cholesterol. ? Lowering blood glucose levels. What is my activity plan? Your health care provider or certified diabetes educator can help you make a plan for the type and frequency of exercise (activity plan) that works for you. Make sure that you:  Do at least 150 minutes of moderate-intensity or vigorous-intensity exercise each week. This could be brisk walking, biking, or water aerobics. ? Do stretching and strength exercises, such as yoga or weightlifting, at least 2 times a week. ? Spread out your activity over at least 3 days of the week.  Get some form of physical activity every day. ? Do not go more than 2 days in a row without some kind of physical activity. ? Avoid being inactive for more than 30 minutes at a time. Take frequent breaks to walk or stretch.  Choose a type of exercise or activity that you enjoy, and set realistic goals.  Start slowly, and gradually increase the intensity of your exercise over time. What do I need to know about managing my  diabetes?   Check your blood glucose before and after exercising. ? If your blood glucose is 240 mg/dL (13.3 mmol/L) or higher before you exercise, check your urine for ketones. If you have ketones in your urine, do not exercise until your blood glucose returns to normal. ? If your blood glucose is 100 mg/dL (5.6 mmol/L) or lower, eat a snack containing 15-20 grams of carbohydrate. Check your blood glucose 15 minutes after the snack to make sure that your level is above 100 mg/dL (5.6 mmol/L) before you start your exercise.  Know the symptoms of low blood glucose (hypoglycemia) and how to treat it. Your risk for hypoglycemia increases during and after exercise. Common symptoms of hypoglycemia can include: ? Hunger. ? Anxiety. ? Sweating and feeling clammy. ? Confusion. ? Dizziness or feeling light-headed. ? Increased heart rate or palpitations. ? Blurry vision. ? Tingling or numbness around the mouth, lips, or tongue. ? Tremors or shakes. ? Irritability.  Keep a rapid-acting carbohydrate snack available before, during, and after exercise to help prevent or treat hypoglycemia.  Avoid injecting insulin into areas of the body that are going to be exercised. For example, avoid injecting insulin into: ? The arms, when playing tennis. ? The legs, when jogging.  Keep records of your exercise habits. Doing this can help you and your health care provider adjust your diabetes management plan as needed. Write down: ? Food that you eat before and after you exercise. ? Blood glucose levels before and after you exercise. ? The type and amount of exercise you have done. ? When your insulin is expected to peak, if you use  insulin. Avoid exercising at times when your insulin is peaking.  When you start a new exercise or activity, work with your health care provider to make sure the activity is safe for you, and to adjust your insulin, medicines, or food intake as needed.  Drink plenty of water while  you exercise to prevent dehydration or heat stroke. Drink enough fluid to keep your urine clear or pale yellow. Summary  Exercising regularly is important for your overall health, especially when you have diabetes (diabetes mellitus).  Exercising has many health benefits, such as increasing muscle strength and bone density and reducing body fat and stress.  Your health care provider or certified diabetes educator can help you make a plan for the type and frequency of exercise (activity plan) that works for you.  When you start a new exercise or activity, work with your health care provider to make sure the activity is safe for you, and to adjust your insulin, medicines, or food intake as needed. This information is not intended to replace advice given to you by your health care provider. Make sure you discuss any questions you have with your health care provider. Document Revised: 02/26/2017 Document Reviewed: 01/14/2016 Elsevier Patient Education  [employer].

## 2019-10-06 NOTE — Telephone Encounter (Signed)
Please advise refill? This medication has not been filled by provider previously.

## 2019-10-06 NOTE — Telephone Encounter (Signed)
Requested medication (s) are due for refill today: yes  Requested medication (s) are on the active medication list: yes  Last refill:  09/19/19  Future visit scheduled: no  Notes to clinic:  historical provider   Requested Prescriptions  Pending Prescriptions Disp Refills   furosemide (LASIX) 40 MG tablet [Pharmacy Med Name: FUROSEMIDE 40 MG TAB] 90 tablet     Sig: TAKE 1 TABLET BY MOUTH DAILY      Cardiovascular:  Diuretics - Loop Failed - 10/06/2019  1:17 PM      Failed - Cr in normal range and within 360 days    Creatinine, Ser  Date Value Ref Range Status  07/29/2019 1.99 (H) 0.44 - 1.00 mg/dL Final          Passed - K in normal range and within 360 days    Potassium  Date Value Ref Range Status  07/29/2019 3.9 3.5 - 5.1 mmol/L Final          Passed - Ca in normal range and within 360 days    Calcium  Date Value Ref Range Status  07/29/2019 9.0 8.9 - 10.3 mg/dL Final   Calcium, Ion  Date Value Ref Range Status  07/29/2019 1.15 1.15 - 1.40 mmol/L Final          Passed - Na in normal range and within 360 days    Sodium  Date Value Ref Range Status  07/29/2019 144 135 - 145 mmol/L Final  03/28/2019 141 134 - 144 mmol/L Final          Passed - Last BP in normal range    BP Readings from Last 1 Encounters:  08/04/19 132/62          Passed - Valid encounter within last 6 months    Recent Outpatient Visits           Today Chronic pulmonary embolism, unspecified pulmonary embolism type, unspecified whether acute cor pulmonale present [facility])   [facility] [place], Fabio Bering M, PA-C   2 months ago History of stroke   [place], [place], PA-C   3 months ago Diabetes mellitus without complication [facility])   [place], [place], PA-C   6 months ago Congestive heart failure, unspecified HF chronicity, unspecified heart failure type [facility])   [facility] Trinna Post, [place]        Future Appointments             In 3 months Trinna Post, PA-C [employer], [facility]

## 2019-10-10 ENCOUNTER — Other Ambulatory Visit: Payer: Self-pay

## 2019-10-10 MED ORDER — AMBRISENTAN 5 MG PO TABS: 5.0000 mg | ORAL_TABLET | Freq: Every day | ORAL | 1 refills | Status: DC

## 2019-10-10 MED ORDER — TADALAFIL 20 MG PO TABS: 40.0000 mg | ORAL_TABLET | Freq: Every day | ORAL | 1 refills | Status: DC

## 2019-10-11 ENCOUNTER — Encounter: Payer: Medicare Other | Admitting: *Deleted

## 2019-10-11 ENCOUNTER — Other Ambulatory Visit: Payer: Self-pay

## 2019-10-11 DIAGNOSIS — I270 Primary pulmonary hypertension: Secondary | ICD-10-CM | POA: Diagnosis not present

## 2019-10-11 DIAGNOSIS — I272 Pulmonary hypertension, unspecified: Secondary | ICD-10-CM

## 2019-10-11 NOTE — Progress Notes (Signed)
Daily Session Note  Patient Details  Name: Ann Flynn MRN: [id number] Date of Birth: 1941/06/08 Referring Provider:     Pulmonary Rehab from 09/22/2019 in [facility] Cardiac [facility] Pulmonary [facility]  Referring Provider  Glori Bickers MD      Encounter Date: 10/11/2019  Check In: Session Check In - 10/11/19 1117      Check-In   Supervising physician immediately available to respond to emergencies  See telemetry face sheet for immediately available ER MD    Location  [facility]-Cardiac & Pulmonary Rehab    Staff Present  Renita Papa, RN BSN;Melissa Caiola RDN, LDN;Jessica [place], MA, RCEP, CCRP, CCET;Amanda Sommer, BA, ACSM CEP, Exercise Physiologist    Virtual Visit  No    Medication changes reported      No    Fall or balance concerns reported     No    Warm-up and Cool-down  Performed on first and last piece of equipment    Resistance Training Performed  Yes    VAD Patient?  No    PAD/SET Patient?  No      Pain Assessment   Currently in Pain?  No/denies          Social History   Tobacco Use  Smoking Status Never Smoker  Smokeless Tobacco Never Used    Goals Met:  Independence with exercise equipment Exercise tolerated well No report of cardiac concerns or symptoms Strength training completed today  Goals Unmet:  Not Applicable  Comments: Pt able to follow exercise prescription today without complaint.  Will continue to monitor for progression.    Dr. Emily Filbert is Medical Director for [place] and [facility].

## 2019-10-12 ENCOUNTER — Telehealth: Payer: Self-pay

## 2019-10-12 ENCOUNTER — Other Ambulatory Visit: Payer: Self-pay

## 2019-10-12 ENCOUNTER — Encounter: Payer: Self-pay | Admitting: *Deleted

## 2019-10-12 DIAGNOSIS — I272 Pulmonary hypertension, unspecified: Secondary | ICD-10-CM

## 2019-10-12 NOTE — Telephone Encounter (Signed)
Called patient and no answer left a voicemail message for patient to return call.  Also, called Jacqlyn Larsen w/ [employer] [phone] [phone] and left a voicemail for her to call the office back.

## 2019-10-12 NOTE — Progress Notes (Signed)
Pulmonary Individual Treatment Plan  Patient Details  Name: Ann Flynn MRN: [id number] Date of Birth: 1940-08-21 Referring Provider:     Pulmonary Rehab from 09/22/2019 in [facility] Cardiac and Pulmonary Rehab  Referring Provider  Glori Bickers MD      Initial Encounter Date:    Pulmonary Rehab from 09/22/2019 in [facility] Cardiac and Pulmonary Rehab  Date  09/22/19      Visit Diagnosis: Pulmonary hypertension ([place])  Patient's Home Medications on Admission:  Current Outpatient Medications:  .  furosemide (LASIX) 40 MG tablet, TAKE 1 TABLET BY MOUTH DAILY, Disp: 90 tablet, Rfl: 1 .  acetaminophen (TYLENOL) 500 MG tablet, Take 500 mg by mouth 2 (two) times daily as needed for moderate pain., Disp: , Rfl:  .  ambrisentan (LETAIRIS) 5 MG tablet, Take 1 tablet (5 mg total) by mouth daily., Disp: 90 tablet, Rfl: 1 .  amLODipine (NORVASC) 5 MG tablet, Take 1 tablet (5 mg total) by mouth daily. (Patient taking differently: Take 5 mg by mouth every evening. ), Disp: 30 tablet, Rfl: 6 .  atorvastatin (LIPITOR) 80 MG tablet, TAKE 1 TABLET BY MOUTH DAILY, Disp: 90 tablet, Rfl: 0 .  BD PEN NEEDLE NANO U/F 32G X 4 MM MISC, USE TO INJECT ONCE DAILY AS DIRECTED, Disp: 100 each, Rfl: 0 .  Cyanocobalamin (B-12 PO), Take 1 tablet by mouth daily., Disp: , Rfl:  .  docusate sodium (COLACE) 100 MG capsule, Take 100 mg by mouth at bedtime., Disp: , Rfl:  .  Dulaglutide (TRULICITY) 3.00 TM/2.2QJ SOPN, Inject 0.75 mg into the skin once a week. (Patient taking differently: Inject 0.75 mg into the skin every Monday. ), Disp: 4 pen, Rfl: 0 .  Insulin Glargine, 1 Unit Dial, (TOUJEO SOLOSTAR) 300 UNIT/ML SOPN, Inject 50 Units into the skin daily. 50 units daily. (Patient taking differently: Inject 40-50 Units into the skin at bedtime. ), Disp: 6 pen, Rfl: 2 .  JANUVIA 25 MG tablet, TAKE ONE TABLET BY MOUTH EVERY DAY, Disp: 90 tablet, Rfl: 0 .  losartan (COZAAR) 100 MG tablet, TAKE 1 TABLET BY MOUTH DAILY, Disp: 30  tablet, Rfl: 2 .  metoprolol tartrate (LOPRESSOR) 25 MG tablet, Take 1 tablet (25 mg total) by mouth 2 (two) times daily., Disp: 90 tablet, Rfl: 1 .  omeprazole (PRILOSEC) 40 MG capsule, TAKE 1 CAPSULE BY MOUTH DAILY, Disp: 30 capsule, Rfl: 2 .  tadalafil (CIALIS) 20 MG tablet, Take 2 tablets (40 mg total) by mouth daily., Disp: 180 tablet, Rfl: 1 .  venlafaxine (EFFEXOR) 75 MG tablet, Take 1 tablet (75 mg total) by mouth daily., Disp: 90 tablet, Rfl: 1 .  VITAMIN D PO, Take 1 capsule by mouth daily., Disp: , Rfl:  .  warfarin (COUMADIN) 3 MG tablet, Take 3 mg on MWF along with 4 mg on the remaining days., Disp: 30 tablet, Rfl: 0 .  warfarin (COUMADIN) 4 MG tablet, Take 2-4 mg by mouth See admin instructions. Take 4 mg at night on Sun, Tue, Thurs, and Sat, take 2 mg at night on Mon, Wed, and Fri, Disp: , Rfl:   Past Medical History: No past medical history on file.  Tobacco Use: Social History   Tobacco Use  Smoking Status Never Smoker  Smokeless Tobacco Never Used    Labs: Recent Review Flowsheet Data    Labs for ITP Cardiac and Pulmonary Rehab Latest Ref Rng & Units 03/28/2019 07/06/2019 07/29/2019 07/29/2019 07/29/2019   Cholestrol 100 - 199 mg/dL 186 - - - -  LDLCALC 0 - 99 mg/dL 95 - - - -   HDL >39 mg/dL 43 - - - -   Trlycerides 0 - 149 mg/dL 240(H) - - - -   Hemoglobin A1c 4.0 - 5.6 % 7.2(H) 7.3(A) - - -   HCO3 20.0 - 28.0 mmol/L - - 26.3 26.7 26.9   TCO2 22 - 32 mmol/L - - _0 O2SAT % - - 75.0 74.0 72.0       Pulmonary Assessment Scores: Pulmonary Assessment Scores    Row Name 09/22/19 1543         ADL UCSD   ADL Phase  Entry     SOB Score total  42     Rest  0     Walk  2     Stairs  5     Bath  1     Dress  1     Shop  2       CAT Score   CAT Score  22       mMRC Score   mMRC Score  2        UCSD: Self-administered rating of dyspnea associated with activities of daily living (ADLs) 6-point scale (0 = "not at all" to 5 = "maximal or unable  to do because of breathlessness")  Scoring Scores range from 0 to 120.  Minimally important difference is 5 units  CAT: CAT can identify the health impairment of COPD patients and is better correlated with disease progression.  CAT has a scoring range of zero to 40. The CAT score is classified into four groups of low (less than 10), medium (10 - 20), high (21-30) and very high (31-40) based on the impact level of disease on health status. A CAT score over 10 suggests significant symptoms.  A worsening CAT score could be explained by an exacerbation, poor medication adherence, poor inhaler technique, or progression of COPD or comorbid conditions.  CAT MCID is 2 points  mMRC: mMRC (Modified Medical Research Council) Dyspnea Scale is used to assess the degree of baseline functional disability in patients of respiratory disease due to dyspnea. No minimal important difference is established. A decrease in score of 1 point or greater is considered a positive change.   Pulmonary Function Assessment: Pulmonary Function Assessment - 09/22/19 1543      Breath   Shortness of Breath  Yes;Fear of Shortness of Breath;Limiting activity       Exercise Target Goals: Exercise Program Goal: Individual exercise prescription set using results from initial 6 min walk test and THRR while considering  patient's activity barriers and safety.   Exercise Prescription Goal: Initial exercise prescription builds to 30-45 minutes a day of aerobic activity, 2-3 days per week.  Home exercise guidelines will be given to patient during program as part of exercise prescription that the participant will acknowledge.  Activity Barriers & Risk Stratification: Activity Barriers & Cardiac Risk Stratification - 09/21/19 0808      Activity Barriers & Cardiac Risk Stratification   Activity Barriers  Arthritis;Joint Problems;Other (comment);Deconditioning;Muscular Weakness;Shortness of Breath;Balance Concerns    Comments  Right  leg/hip flaring up occassionally (possible sciattica)       6 Minute Walk: 6 Minute Walk    Row Name 09/22/19 1534         6 Minute Walk   Phase  Initial     Distance  400 feet     Walk Time  5 minutes     #  of Rest Breaks  0 stopped at 5 min     MPH  0.91     METS  0.56     RPE  17     Perceived Dyspnea   3     VO2 Peak  1.95     Symptoms  Yes (comment)     Comments  dizzy, hips hurting 8/10     Resting HR  62 bpm     Resting BP  134/72     Resting Oxygen Saturation   99 %     Exercise Oxygen Saturation  during 6 min walk  86 %     Max Ex. HR  120 bpm     Max Ex. BP  152/84     2 Minute Post BP  134/74       Interval HR   1 Minute HR  95     2 Minute HR  113     3 Minute HR  117     4 Minute HR  119     5 Minute HR  120     6 Minute HR  111     2 Minute Post HR  87     Interval Heart Rate?  Yes       Interval Oxygen   Interval Oxygen?  Yes     Baseline Oxygen Saturation %  99 %     1 Minute Oxygen Saturation %  95 %     1 Minute Liters of Oxygen  3 L     2 Minute Oxygen Saturation %  94 %     2 Minute Liters of Oxygen  3 L     3 Minute Oxygen Saturation %  91 %     3 Minute Liters of Oxygen  3 L     4 Minute Oxygen Saturation %  85 %     4 Minute Liters of Oxygen  3 L     5 Minute Oxygen Saturation %  86 %     5 Minute Liters of Oxygen  3 L     6 Minute Oxygen Saturation %  90 %     6 Minute Liters of Oxygen  3 L     2 Minute Post Oxygen Saturation %  98 %     2 Minute Post Liters of Oxygen  3 L       Oxygen Initial Assessment: Oxygen Initial Assessment - 09/21/19 0811      Home Oxygen   Home Oxygen Device  Home Concentrator;E-Tanks;Portable Concentrator   just approved for portable concentrator   Sleep Oxygen Prescription  CPAP;Continuous    Liters per minute  2   11 setting   Home Exercise Oxygen Prescription  Continuous   new concentrator will be pulsed   Liters per minute  3    Home at Rest Exercise Oxygen Prescription  Continuous    Liters  per minute  2.5    Compliance with Home Oxygen Use  Yes      Initial 6 min Walk   Oxygen Used  Continuous;E-Tanks   may want to test new concentrator once it arrives   Liters per minute  3      Program Oxygen Prescription   Program Oxygen Prescription  Continuous;E-Tanks    Liters per minute  3      Intervention   Short Term Goals  To learn and exhibit compliance with exercise, home and travel O2 prescription;To  learn and understand importance of maintaining oxygen saturations>88%;To learn and understand importance of monitoring SPO2 with pulse oximeter and demonstrate accurate use of the pulse oximeter.;To learn and demonstrate proper pursed lip breathing techniques or other breathing techniques.;To learn and demonstrate proper use of respiratory medications   has pulse oximeter   Long  Term Goals  Exhibits compliance with exercise, home and travel O2 prescription;Verbalizes importance of monitoring SPO2 with pulse oximeter and return demonstration;Maintenance of O2 saturations>88%;Exhibits proper breathing techniques, such as pursed lip breathing or other method taught during program session;Compliance with respiratory medication;Demonstrates proper use of MDI's       Oxygen Re-Evaluation: Oxygen Re-Evaluation    Row Name 09/27/19 1123             Program Oxygen Prescription   Program Oxygen Prescription  Continuous;E-Tanks       Liters per minute  3         Home Oxygen   Home Oxygen Device  Home Concentrator;E-Tanks;Portable Concentrator       Sleep Oxygen Prescription  CPAP;Continuous       Liters per minute  2       Liters per minute  3       Home at Rest Exercise Oxygen Prescription  Continuous       Liters per minute  2.5       Compliance with Home Oxygen Use  Yes         Goals/Expected Outcomes   Short Term Goals  To learn and exhibit compliance with exercise, home and travel O2 prescription;To learn and understand importance of maintaining oxygen saturations>88%;To  learn and understand importance of monitoring SPO2 with pulse oximeter and demonstrate accurate use of the pulse oximeter.;To learn and demonstrate proper pursed lip breathing techniques or other breathing techniques.;To learn and demonstrate proper use of respiratory medications       Long  Term Goals  Exhibits compliance with exercise, home and travel O2 prescription;Verbalizes importance of monitoring SPO2 with pulse oximeter and return demonstration;Maintenance of O2 saturations>88%;Exhibits proper breathing techniques, such as pursed lip breathing or other method taught during program session;Compliance with respiratory medication;Demonstrates proper use of MDI's       Comments  Reviewed PLB technique with pt.  Talked about how it works and it's importance in maintaining their exercise saturations.       Goals/Expected Outcomes  Short: Become more profiecient at using PLB.   Long: Become independent at using PLB.          Oxygen Discharge (Final Oxygen Re-Evaluation): Oxygen Re-Evaluation - 09/27/19 1123      Program Oxygen Prescription   Program Oxygen Prescription  Continuous;E-Tanks    Liters per minute  3      Home Oxygen   Home Oxygen Device  Home Concentrator;E-Tanks;Portable Concentrator    Sleep Oxygen Prescription  CPAP;Continuous    Liters per minute  2    Liters per minute  3    Home at Rest Exercise Oxygen Prescription  Continuous    Liters per minute  2.5    Compliance with Home Oxygen Use  Yes      Goals/Expected Outcomes   Short Term Goals  To learn and exhibit compliance with exercise, home and travel O2 prescription;To learn and understand importance of maintaining oxygen saturations>88%;To learn and understand importance of monitoring SPO2 with pulse oximeter and demonstrate accurate use of the pulse oximeter.;To learn and demonstrate proper pursed lip breathing techniques or other breathing techniques.;To learn and demonstrate  proper use of respiratory medications     Long  Term Goals  Exhibits compliance with exercise, home and travel O2 prescription;Verbalizes importance of monitoring SPO2 with pulse oximeter and return demonstration;Maintenance of O2 saturations>88%;Exhibits proper breathing techniques, such as pursed lip breathing or other method taught during program session;Compliance with respiratory medication;Demonstrates proper use of MDI's    Comments  Reviewed PLB technique with pt.  Talked about how it works and it's importance in maintaining their exercise saturations.    Goals/Expected Outcomes  Short: Become more profiecient at using PLB.   Long: Become independent at using PLB.       Initial Exercise Prescription: Initial Exercise Prescription - 09/22/19 1500      Date of Initial Exercise RX and Referring Provider   Date  09/22/19    Referring Provider  Bensimhon, Daniel MD      Oxygen   Oxygen  Continuous    Liters  3      Treadmill   MPH  0.8    Grade  0    Minutes  15    METs  1.6      NuStep   Level  1    SPM  80    Minutes  15    METs  1.5      T5 Nustep   Level  1    SPM  80    Minutes  15    METs  1.5      Biostep-RELP   Level  1    SPM  50    Minutes  15    METs  1      Prescription Details   Frequency (times per week)  2    Duration  Progress to 30 minutes of continuous aerobic without signs/symptoms of physical distress      Intensity   THRR 40-80% of Max Heartrate  94-126    Ratings of Perceived Exertion  11-13    Perceived Dyspnea  0-4      Progression   Progression  Continue to progress workloads to maintain intensity without signs/symptoms of physical distress.      Resistance Training   Training Prescription  Yes    Weight  3 lb    Reps  10-15       Perform Capillary Blood Glucose checks as needed.  Exercise Prescription Changes: Exercise Prescription Changes    Row Name 09/22/19 1500 10/04/19 1100           Response to Exercise   Blood Pressure (Admit)  134/72  158/80       Blood Pressure (Exercise)  152/84  156/84      Blood Pressure (Exit)  126/72  126/64      Heart Rate (Admit)  62 bpm  78 bpm      Heart Rate (Exercise)  120 bpm  84 bpm      Heart Rate (Exit)  64 bpm  67 bpm      Oxygen Saturation (Admit)  99 %  97 %      Oxygen Saturation (Exercise)  85 %  95 %      Oxygen Saturation (Exit)  97 %  95 %      Rating of Perceived Exertion (Exercise)  17  16      Perceived Dyspnea (Exercise)  3  3      Symptoms  dizzy, hip hurting 8/10  --      Comments  walk test results  first day         Exercise Comments:   Exercise Goals and Review: Exercise Goals    Row Name 09/22/19 1540             Exercise Goals   Increase Physical Activity  Yes       Intervention  Provide advice, education, support and counseling about physical activity/exercise needs.;Develop an individualized exercise prescription for aerobic and resistive training based on initial evaluation findings, risk stratification, comorbidities and participant's personal goals.       Expected Outcomes  Short Term: Attend rehab on a regular basis to increase amount of physical activity.;Long Term: Add in home exercise to make exercise part of routine and to increase amount of physical activity.;Long Term: Exercising regularly at least 3-5 days a week.       Increase Strength and Stamina  Yes       Intervention  Provide advice, education, support and counseling about physical activity/exercise needs.;Develop an individualized exercise prescription for aerobic and resistive training based on initial evaluation findings, risk stratification, comorbidities and participant's personal goals.       Expected Outcomes  Short Term: Increase workloads from initial exercise prescription for resistance, speed, and METs.;Short Term: Perform resistance training exercises routinely during rehab and add in resistance training at home;Long Term: Improve cardiorespiratory fitness, muscular endurance and strength as  measured by increased METs and functional capacity (6MWT)       Able to understand and use rate of perceived exertion (RPE) scale  Yes       Intervention  Provide education and explanation on how to use RPE scale       Expected Outcomes  Short Term: Able to use RPE daily in rehab to express subjective intensity level;Long Term:  Able to use RPE to guide intensity level when exercising independently       Able to understand and use Dyspnea scale  Yes       Intervention  Provide education and explanation on how to use Dyspnea scale       Expected Outcomes  Short Term: Able to use Dyspnea scale daily in rehab to express subjective sense of shortness of breath during exertion;Long Term: Able to use Dyspnea scale to guide intensity level when exercising independently       Knowledge and understanding of Target Heart Rate Range (THRR)  Yes       Intervention  Provide education and explanation of THRR including how the numbers were predicted and where they are located for reference       Expected Outcomes  Short Term: Able to state/look up THRR;Short Term: Able to use daily as guideline for intensity in rehab;Long Term: Able to use THRR to govern intensity when exercising independently       Able to check pulse independently  Yes       Intervention  Provide education and demonstration on how to check pulse in carotid and radial arteries.;Review the importance of being able to check your own pulse for safety during independent exercise       Expected Outcomes  Short Term: Able to explain why pulse checking is important during independent exercise;Long Term: Able to check pulse independently and accurately       Understanding of Exercise Prescription  Yes       Intervention  Provide education, explanation, and written materials on patient's individual exercise prescription       Expected Outcomes  Short Term: Able to explain program exercise  prescription;Long Term: Able to explain home exercise prescription to  exercise independently          Exercise Goals Re-Evaluation : Exercise Goals Re-Evaluation    [place] Name 09/27/19 1122             Exercise Goal Re-Evaluation   Exercise Goals Review  Increase Physical Activity;Able to understand and use rate of perceived exertion (RPE) scale;Knowledge and understanding of Target Heart Rate Range (THRR);Understanding of Exercise Prescription;Increase Strength and Stamina;Able to understand and use Dyspnea scale;Able to check pulse independently       Comments  Reviewed RPE scale, THR and program prescription with pt today.  Pt voiced understanding and was given a copy of goals to take home.       Expected Outcomes  Short: Use RPE daily to regulate intensity. Long: Follow program prescription in THR.          Discharge Exercise Prescription (Final Exercise Prescription Changes): Exercise Prescription Changes - 10/04/19 1100      Response to Exercise   Blood Pressure (Admit)  158/80    Blood Pressure (Exercise)  156/84    Blood Pressure (Exit)  126/64    Heart Rate (Admit)  78 bpm    Heart Rate (Exercise)  84 bpm    Heart Rate (Exit)  67 bpm    Oxygen Saturation (Admit)  97 %    Oxygen Saturation (Exercise)  95 %    Oxygen Saturation (Exit)  95 %    Rating of Perceived Exertion (Exercise)  16    Perceived Dyspnea (Exercise)  3    Comments  first day       Nutrition:  Target Goals: Understanding of nutrition guidelines, daily intake of sodium <1566m, cholesterol <2040m calories 30% from fat and 7% or less from saturated fats, daily to have 5 or more servings of fruits and vegetables.  Biometrics: Pre Biometrics - 09/22/19 1541      Pre Biometrics   Height  5' 6.5" (1.689 m)    Weight  250 lb 12.8 oz (113.8 kg)    BMI (Calculated)  39.88    Single Leg Stand  1.53 seconds        Nutrition Therapy Plan and Nutrition Goals: Nutrition Therapy & Goals - 09/26/19 1503      Nutrition Therapy   Diet  HH diet    Drug/Food Interactions   Statins/Certain Fruits;Coumadin/Vit K    Protein (specify units)  125.18g    Fiber  25 grams    Whole Grain Foods  3 servings    Saturated Fats  12 max. grams    Fruits and Vegetables  5 servings/day    Sodium  1.5 grams      Personal Nutrition Goals   Nutrition Goal  ST: include more vegetables in an easier manner LT: able to get around better, would like to increase balance, MD said to lose weight.    Comments  Pt reports able to do ADL. Toast with peanut butter (wheat, unless its expensive) with 2 cups of coffee (black)and  Juice glass of cranberry juice (mostly water). Pimento cheese cracker or banana and peanut butter, cereal, yogurt.  Meatloaf and baked potato. Pt reports not eating many vegetables. Snacks on fruit. Pt on long lasting insulin; checks BG every morning - takes insulin at night. Discussed HH eating, set-point weight, pulmonary MNT. Lipitor (statin),Colace,Lasix,T2DM: januvia, Insulin Glargine (long lasting),Coumadin,Vitamin D, CKD stg 4, CHF, Pulmonary hypertension      Intervention Plan  Intervention  Nutrition handout(s) given to patient.    Expected Outcomes  Short Term Goal: Understand basic principles of dietary content, such as calories, fat, sodium, cholesterol and nutrients.;Short Term Goal: A plan has been developed with personal nutrition goals set during dietitian appointment.;Long Term Goal: Adherence to prescribed nutrition plan.       Nutrition Assessments: Nutrition Assessments - 09/22/19 1542      MEDFICTS Scores   Pre Score  56       Nutrition Goals Re-Evaluation:   Nutrition Goals Discharge (Final Nutrition Goals Re-Evaluation):   Psychosocial: Target Goals: Acknowledge presence or absence of significant depression and/or stress, maximize coping skills, provide positive support system. Participant is able to verbalize types and ability to use techniques and skills needed for reducing stress and depression.   Initial Review & Psychosocial  Screening: Initial Psych Review & Screening - 09/21/19 0817      Initial Review   Current issues with  Current Depression;History of Depression;Current Psychotropic Meds;Current Stress Concerns    Source of Stress Concerns  Chronic Illness;Family;Retirement/disability    Comments  History of depression from loss of husband six years ago and started taking Elavil and she feels like her meds are working.  She recently moved down here about 7 months ago and the pandemic has made it hard to get acquainted.  She has two kids, daughter in [place] and son in [place].      Family Dynamics   Good Support System?  Yes   Son and daughter are support system and live near by, hard to make new friends   Comments  Lost husband six years ago and just moved here 7 months ago      Barriers   Psychosocial barriers to participate in program  The patient should benefit from training in stress management and relaxation.;Psychosocial barriers identified (see note)      Screening Interventions   Interventions  Encouraged to exercise;Program counselor consult;To provide support and resources with identified psychosocial needs;Provide feedback about the scores to participant    Expected Outcomes  Short Term goal: Utilizing psychosocial counselor, staff and physician to assist with identification of specific Stressors or current issues interfering with healing process. Setting desired goal for each stressor or current issue identified.;Long Term Goal: Stressors or current issues are controlled or eliminated.;Short Term goal: Identification and review with participant of any Quality of Life or Depression concerns found by scoring the questionnaire.;Long Term goal: The participant improves quality of Life and PHQ9 Scores as seen by post scores and/or verbalization of changes       Quality of Life Scores:  Scores of 19 and below usually indicate a poorer quality of life in these areas.  A difference of  2-3 points  is a clinically meaningful difference.  A difference of 2-3 points in the total score of the Quality of Life Index has been associated with significant improvement in overall quality of life, self-image, physical symptoms, and general health in studies assessing change in quality of life.  PHQ-9: Recent Review Flowsheet Data    Depression screen [facility] 2/9 10/11/2019 09/22/2019 03/28/2019   Decreased Interest 1 1 0   Down, Depressed, Hopeless _0 PHQ - 2 Score _1 Altered sleeping 1 2 -   Tired, decreased energy 1 3 -   Change in appetite 1 2 -   Feeling bad or failure about yourself  1 2 -   Trouble concentrating 0 0 -  Moving slowly or fidgety/restless 0 0 -   Suicidal thoughts 0 0 -   PHQ-9 Score 6 11 -   Difficult doing work/chores Somewhat difficult Somewhat difficult -     Interpretation of Total Score  Total Score Depression Severity:  1-4 = Minimal depression, 5-9 = Mild depression, 10-14 = Moderate depression, 15-19 = Moderately severe depression, 20-27 = Severe depression   Psychosocial Evaluation and Intervention: Psychosocial Evaluation - 09/21/19 0831      Psychosocial Evaluation & Interventions   Interventions  Encouraged to exercise with the program and follow exercise prescription    Comments  Ms. Oriordan is coming into Pulmonary Rehab with history of pulmonary hypertension, ILD, and heart failure.  She moved to the area seven months ago and has found it hard to settle in and make friends during the pandemic.  Her kids live close by (daughter in [place] and son in [place], [place]).  She lost her husband six years ago and decided it was time to be closer to her kids. She really wants to get into a routine and start moving more as the pandemic has turned her into a couch potato.  She is currently needing to be wheeled around at the hospital but we are going to work with her to be able to walk around on her own by building strength and stamina.  Coming to class will also  offer her a social outlet and chance to meet some people in the area.  We look forward to working with her.    Expected Outcomes  Short: Build up strength and stamina for improved mobility.  Long: Make adjustments to living in the area.    Continue Psychosocial Services   Follow up required by staff       Psychosocial Re-Evaluation:   Psychosocial Discharge (Final Psychosocial Re-Evaluation):   Education: Education Goals: Education classes will be provided on a weekly basis, covering required topics. Participant will state understanding/return demonstration of topics presented.  Learning Barriers/Preferences: Learning Barriers/Preferences - 09/21/19 9449      Learning Barriers/Preferences   Learning Barriers  Sight   glasses   Learning Preferences  None       Education Topics:  Initial Evaluation Education: - Verbal, written and demonstration of respiratory meds, oximetry and breathing techniques. Instruction on use of nebulizers and MDIs and importance of monitoring MDI activations.   Pulmonary Rehab from 09/22/2019 in [facility] Cardiac and Pulmonary Rehab  Date  09/22/19  Educator  [facility]  Instruction Review Code  1- Verbalizes Understanding      General Nutrition Guidelines/Fats and Fiber: -Group instruction provided by verbal, written material, models and posters to present the general guidelines for heart healthy nutrition. Gives an explanation and review of dietary fats and fiber.   Controlling Sodium/Reading Food Labels: -Group verbal and written material supporting the discussion of sodium use in heart healthy nutrition. Review and explanation with models, verbal and written materials for utilization of the food label.   Exercise Physiology & General Exercise Guidelines: - Group verbal and written instruction with models to review the exercise physiology of the cardiovascular system and associated critical values. Provides general exercise guidelines with specific guidelines  to those with heart or lung disease.    Aerobic Exercise & Resistance Training: - Gives group verbal and written instruction on the various components of exercise. Focuses on aerobic and resistive training programs and the benefits of this training and how to safely progress through these programs.   Flexibility, Balance, Mind/Body  Relaxation: Provides group verbal/written instruction on the benefits of flexibility and balance training, including mind/body exercise modes such as yoga, pilates and tai chi.  Demonstration and skill practice provided.   Stress and Anxiety: - Provides group verbal and written instruction about the health risks of elevated stress and causes of high stress.  Discuss the correlation between heart/lung disease and anxiety and treatment options. Review healthy ways to manage with stress and anxiety.   Depression: - Provides group verbal and written instruction on the correlation between heart/lung disease and depressed mood, treatment options, and the stigmas associated with seeking treatment.   Exercise & Equipment Safety: - Individual verbal instruction and demonstration of equipment use and safety with use of the equipment.   Pulmonary Rehab from 09/22/2019 in [facility] Cardiac and Pulmonary Rehab  Date  09/22/19  Educator  [facility]  Instruction Review Code  1- Verbalizes Understanding      Infection Prevention: - Provides verbal and written material to individual with discussion of infection control including proper hand washing and proper equipment cleaning during exercise session.   Pulmonary Rehab from 09/22/2019 in [facility] Cardiac and Pulmonary Rehab  Date  09/22/19  Educator  [facility]  Instruction Review Code  1- Verbalizes Understanding      Falls Prevention: - Provides verbal and written material to individual with discussion of falls prevention and safety.   Pulmonary Rehab from 09/22/2019 in [facility] Cardiac and Pulmonary Rehab  Date  09/22/19  Educator  [facility]   Instruction Review Code  1- Verbalizes Understanding      Diabetes: - Individual verbal and written instruction to review signs/symptoms of diabetes, desired ranges of glucose level fasting, after meals and with exercise. Advice that pre and post exercise glucose checks will be done for 3 sessions at entry of program.   Chronic Lung Diseases: - Group verbal and written instruction to review updates, respiratory medications, advancements in procedures and treatments. Discuss use of supplemental oxygen including available portable oxygen systems, continuous and intermittent flow rates, concentrators, personal use and safety guidelines. Review proper use of inhaler and spacers. Provide informative websites for self-education.    Energy Conservation: - Provide group verbal and written instruction for methods to conserve energy, plan and organize activities. Instruct on pacing techniques, use of adaptive equipment and posture/positioning to relieve shortness of breath.   Triggers and Exacerbations: - Group verbal and written instruction to review types of environmental triggers and ways to prevent exacerbations. Discuss weather changes, air quality and the benefits of nasal washing. Review warning signs and symptoms to help prevent infections. Discuss techniques for effective airway clearance, coughing, and vibrations.   AED/CPR: - Group verbal and written instruction with the use of models to demonstrate the basic use of the AED with the basic ABC's of resuscitation.   Anatomy and Physiology of the Lungs: - Group verbal and written instruction with the use of models to provide basic lung anatomy and physiology related to function, structure and complications of lung disease.   Anatomy & Physiology of the Heart: - Group verbal and written instruction and models provide basic cardiac anatomy and physiology, with the coronary electrical and arterial systems. Review of Valvular disease and  Heart Failure   Cardiac Medications: - Group verbal and written instruction to review commonly prescribed medications for heart disease. Reviews the medication, class of the drug, and side effects.   Know Your Numbers and Risk Factors: -Group verbal and written instruction about important numbers in your health.  Discussion of  what are risk factors and how they play a role in the disease process.  Review of Cholesterol, Blood Pressure, Diabetes, and BMI and the role they play in your overall health.   Sleep Hygiene: -Provides group verbal and written instruction about how sleep can affect your health.  Define sleep hygiene, discuss sleep cycles and impact of sleep habits. Review good sleep hygiene tips.    Other: -Provides group and verbal instruction on various topics (see comments)    Knowledge Questionnaire Score: Knowledge Questionnaire Score - 09/22/19 1542      Knowledge Questionnaire Score   Pre Score  18/18        Core Components/Risk Factors/Patient Goals at Admission: Personal Goals and Risk Factors at Admission - 09/21/19 0856      Core Components/Risk Factors/Patient Goals on Admission    Weight Management  Yes;Weight Loss    Intervention  Weight Management: Develop a combined nutrition and exercise program designed to reach desired caloric intake, while maintaining appropriate intake of nutrient and fiber, sodium and fats, and appropriate energy expenditure required for the weight goal.;Weight Management: Provide education and appropriate resources to help participant work on and attain dietary goals.    Expected Outcomes  Short Term: Continue to assess and modify interventions until short term weight is achieved;Long Term: Adherence to nutrition and physical activity/exercise program aimed toward attainment of established weight goal;Weight Loss: Understanding of general recommendations for a balanced deficit meal plan, which promotes 1-2 lb weight loss per week and  includes a negative energy balance of [phone] kcal/d;Understanding recommendations for meals to include 15-35% energy as protein, 25-35% energy from fat, 35-60% energy from carbohydrates, less than 273m of dietary cholesterol, 20-35 gm of total fiber daily;Understanding of distribution of calorie intake throughout the day with the consumption of 4-5 meals/snacks    Diabetes  Yes    Intervention  Provide education about signs/symptoms and action to take for hypo/hyperglycemia.;Provide education about proper nutrition, including hydration, and aerobic/resistive exercise prescription along with prescribed medications to achieve blood glucose in normal ranges: Fasting glucose 65-99 mg/dL    Expected Outcomes  Short Term: Participant verbalizes understanding of the signs/symptoms and immediate care of hyper/hypoglycemia, proper foot care and importance of medication, aerobic/resistive exercise and nutrition plan for blood glucose control.;Long Term: Attainment of HbA1C < 7%.    Heart Failure  Yes    Intervention  Provide a combined exercise and nutrition program that is supplemented with education, support and counseling about heart failure. Directed toward relieving symptoms such as shortness of breath, decreased exercise tolerance, and extremity edema.    Expected Outcomes  Improve functional capacity of life;Short term: Attendance in program 2-3 days a week with increased exercise capacity. Reported lower sodium intake. Reported increased fruit and vegetable intake. Reports medication compliance.;Short term: Daily weights obtained and reported for increase. Utilizing diuretic protocols set by physician.;Long term: Adoption of self-care skills and reduction of barriers for early signs and symptoms recognition and intervention leading to self-care maintenance.    Hypertension  Yes    Intervention  Provide education on lifestyle modifcations including regular physical activity/exercise, weight management,  moderate sodium restriction and increased consumption of fresh fruit, vegetables, and low fat dairy, alcohol moderation, and smoking cessation.;Monitor prescription use compliance.    Expected Outcomes  Short Term: Continued assessment and intervention until BP is < 140/965mHG in hypertensive participants. < 130/8080mG in hypertensive participants with diabetes, heart failure or chronic kidney disease.;Long Term: Maintenance of blood pressure at goal  levels.    Lipids  Yes    Intervention  Provide education and support for participant on nutrition & aerobic/resistive exercise along with prescribed medications to achieve LDL <12m, HDL >482m    Expected Outcomes  Short Term: Participant states understanding of desired cholesterol values and is compliant with medications prescribed. Participant is following exercise prescription and nutrition guidelines.;Long Term: Cholesterol controlled with medications as prescribed, with individualized exercise RX and with personalized nutrition plan. Value goals: LDL < 7067mHDL > 40 mg.       Core Components/Risk Factors/Patient Goals Review:    Core Components/Risk Factors/Patient Goals at Discharge (Final Review):    ITP Comments: ITP Comments    Row Name 09/21/19 0857 09/22/19 1534 09/26/19 1536 09/27/19 1120 10/12/19 0625   ITP Comments  Completed virtual orientation today.  EP eval scheduled for 2/4 at 930. [facility]  Completed 6MWT and gym orientation.  Initial ITP created and sent for review to Dr. MarEmily Filbertedical Director.  Completed Initial RD Eval  First full day of exercise!  Patient was oriented to gym and equipment including functions, settings, policies, and procedures.  Patient's individual exercise prescription and treatment plan were reviewed.  All starting workloads were established based on the results of the 6 minute walk test done at initial orientation visit.  The plan for  exercise progression was also introduced and progression will be customized based on patient's performance and goals.  30 day chart review completed. ITP sent to Dr M MZachery Dakinsdical Director, for review,changes as needed and signature. New to program      Comments:

## 2019-10-12 NOTE — Telephone Encounter (Signed)
We received a form for medication clarification request on Ambrisentan. I contacted [facility] and spoke with Destiny and Shauntay and they stated that the medication was last refilled under Dr. Gloris Manchester in [place]. I reached out to the pt to see if she was here in [place] or [place] and she mentioned she is here and wants to continue care with Dr. Mortimer Fries. He will need to be enrolled in the Ambrisentan REMS Portal. [facility] is faxing over paperwork for DK to complete. I will keep this encounter open until process is completed.

## 2019-10-12 NOTE — Telephone Encounter (Signed)
Please call and triage patient. She does have significant heart failure and pulmonary hypertension. Did she take an extra fluid pill or weigh herself at different times of the day?

## 2019-10-12 NOTE — Telephone Encounter (Signed)
See below

## 2019-10-12 NOTE — Telephone Encounter (Signed)
Copied from [place] [phone]. Topic: General - Inquiry >> Oct 12, 2019 10:57 AM Mathis Bud wrote: Reason for CRM: Nurse case manager with [employer] Becky called regarding patient.  Becky checks on her daily.  Patient reported her BP was up yesterday but improved today.  Patient BP yesterday around 430-500pm :198/103 .  Today BP has improved 158/80.  Patient also lost 4 pounds yesterday.  Becky would like PCP or nurse to reach out to patient asap. Call back [facility]) [phone] Ext [phone]

## 2019-10-13 ENCOUNTER — Encounter: Payer: Medicare Other | Admitting: *Deleted

## 2019-10-13 ENCOUNTER — Other Ambulatory Visit: Payer: Self-pay

## 2019-10-13 ENCOUNTER — Telehealth: Payer: Self-pay | Admitting: Internal Medicine

## 2019-10-13 DIAGNOSIS — I270 Primary pulmonary hypertension: Secondary | ICD-10-CM | POA: Diagnosis not present

## 2019-10-13 DIAGNOSIS — I272 Pulmonary hypertension, unspecified: Secondary | ICD-10-CM

## 2019-10-13 NOTE — Telephone Encounter (Signed)
Please see duplicate message created on 10/12/2019.

## 2019-10-13 NOTE — Progress Notes (Signed)
Daily Session Note  Patient Details  Name: Ann Flynn MRN: [id number] Date of Birth: 1941/04/03 Referring Provider:     Pulmonary Rehab from 09/22/2019 in [facility] Cardiac and Pulmonary Rehab  Referring Provider  Glori Bickers MD      Encounter Date: 10/13/2019  Check In: Session Check In - 10/13/19 1206      Check-In   Supervising physician immediately available to respond to emergencies  See telemetry face sheet for immediately available ER MD    Location  [facility]-Cardiac & Pulmonary Rehab    Staff Present  Renita Papa, RN Vickki Hearing, BA, ACSM CEP, Exercise Physiologist;Melissa Caiola RDN, LDN;Joseph Hood RCP,RRT,BSRT    Virtual Visit  No    Medication changes reported      No    Fall or balance concerns reported     No    Warm-up and Cool-down  Performed on first and last piece of equipment    Resistance Training Performed  Yes    VAD Patient?  No    PAD/SET Patient?  No      Pain Assessment   Currently in Pain?  No/denies          Social History   Tobacco Use  Smoking Status Never Smoker  Smokeless Tobacco Never Used    Goals Met:  Proper associated with RPD/PD & O2 Sat Independence with exercise equipment Improved SOB with ADL's Using PLB without cueing & demonstrates good technique Exercise tolerated well No report of cardiac concerns or symptoms Strength training completed today  Goals Unmet:  Not Applicable  Comments: Pt able to follow exercise prescription today without complaint.  Will continue to monitor for progression.    Dr. Emily Filbert is Medical Director for [facility].

## 2019-10-13 NOTE — Telephone Encounter (Signed)
[facility] told patient that DK is not authorized to prescribe the medication ambrisentan and refuse to fill the prescription. Pt states she is completely out of her medicine. Cb#: [phone]

## 2019-10-13 NOTE — Telephone Encounter (Signed)
Received call from pt making [place] aware that she is out of ambrisentan and [facility] will not refill this medication until Dr. Mortimer Fries is enrolled in th REMS portal. Pt is aware that we are aware of this information and that we are currently awaiting Dr. Zoila Shutter signature. I made pt aware that Dr. Mortimer Fries is not currently in the office, due to working in ICU this week. Pt is concerned as she is completely out of this medication.  Forms for enrollment have been received and placed in Dr. Mortimer Fries folder for signature.   Dr. Mortimer Fries please advise. Thanks

## 2019-10-14 ENCOUNTER — Other Ambulatory Visit (HOSPITAL_COMMUNITY): Payer: Self-pay | Admitting: *Deleted

## 2019-10-14 NOTE — Telephone Encounter (Signed)
DK came in and signed forms for the enrollment today. I faxed back and made pt aware of the status.

## 2019-10-14 NOTE — Telephone Encounter (Signed)
Spoke to shontai with ambrisentan REMS portal, who wanted to make [place] aware that Dr. Mortimer Fries has been enrolled in portal and attached to pt's chart.  Rx has been faxed to [facility] for ambrisentan.  Nothing further is needed.

## 2019-10-17 ENCOUNTER — Telehealth: Payer: Self-pay

## 2019-10-17 NOTE — Telephone Encounter (Signed)
Received fax from [facility] stating that Tadalafil is not covered. I called to start a PA and it was automatically denied due to the type of medication the class of drug is for sexual dysfunction. I explained to them that it was for pulmonary hypertension. The rep explained to me that I had to contact pts insurance company.  I called [employer] and started an appeal and they will contact us with an update.

## 2019-10-18 ENCOUNTER — Ambulatory Visit (INDEPENDENT_AMBULATORY_CARE_PROVIDER_SITE_OTHER): Payer: Medicare Other

## 2019-10-18 ENCOUNTER — Encounter: Payer: Medicare Other | Attending: Internal Medicine | Admitting: *Deleted

## 2019-10-18 ENCOUNTER — Other Ambulatory Visit: Payer: Self-pay

## 2019-10-18 ENCOUNTER — Telehealth: Payer: Self-pay | Admitting: Internal Medicine

## 2019-10-18 DIAGNOSIS — Z794 Long term (current) use of insulin: Secondary | ICD-10-CM | POA: Diagnosis not present

## 2019-10-18 DIAGNOSIS — Z8673 Personal history of transient ischemic attack (TIA), and cerebral infarction without residual deficits: Secondary | ICD-10-CM | POA: Diagnosis not present

## 2019-10-18 DIAGNOSIS — Z79899 Other long term (current) drug therapy: Secondary | ICD-10-CM | POA: Diagnosis not present

## 2019-10-18 DIAGNOSIS — Z7901 Long term (current) use of anticoagulants: Secondary | ICD-10-CM | POA: Insufficient documentation

## 2019-10-18 DIAGNOSIS — E118 Type 2 diabetes mellitus with unspecified complications: Secondary | ICD-10-CM | POA: Diagnosis not present

## 2019-10-18 DIAGNOSIS — I270 Primary pulmonary hypertension: Secondary | ICD-10-CM | POA: Diagnosis present

## 2019-10-18 DIAGNOSIS — I272 Pulmonary hypertension, unspecified: Secondary | ICD-10-CM

## 2019-10-18 LAB — POCT INR
INR: 2 (ref 2.0–3.0)
PT: 23.9

## 2019-10-18 NOTE — Telephone Encounter (Signed)
Received appeal paper from [facility] that is needing to be completed by Dr. Mortimer Fries.  Forms have been placed in Dr. Zoila Shutter folder.  Will route to Dr. Mortimer Fries to make aware.

## 2019-10-18 NOTE — Progress Notes (Signed)
Daily Session Note  Patient Details  Name: Ann Flynn MRN: [id number] Date of Birth: 02/19/1941 Referring Provider:     Pulmonary Rehab from 09/22/2019 in [facility] Cardiac and Pulmonary Rehab  Referring Provider  Glori Bickers MD      Encounter Date: 10/18/2019  Check In: Session Check In - 10/18/19 1214      Check-In   Supervising physician immediately available to respond to emergencies  See telemetry face sheet for immediately available ER MD    Location  [facility]-Cardiac & Pulmonary Rehab    Staff Present  Heath Lark, RN, BSN, CCRP;Joseph Hood RCP,RRT,BSRT;Jessica Davis, [place], [place], [place], CCET    Virtual Visit  No    Medication changes reported      No    Fall or balance concerns reported     No    Warm-up and Cool-down  Performed on first and last piece of equipment    Resistance Training Performed  Yes    VAD Patient?  No    PAD/SET Patient?  No      Pain Assessment   Currently in Pain?  No/denies          Social History   Tobacco Use  Smoking Status Never Smoker  Smokeless Tobacco Never Used    Goals Met:  Proper associated with RPD/PD & O2 Sat Independence with exercise equipment Exercise tolerated well No report of cardiac concerns or symptoms  Goals Unmet:  Not Applicable  Comments: Pt able to follow exercise prescription today without complaint.  Will continue to monitor for progression.    Dr. Emily Filbert is Medical Director for [place] [facility].

## 2019-10-18 NOTE — Telephone Encounter (Signed)
LVMTCBx1.

## 2019-10-18 NOTE — Patient Instructions (Signed)
Continue 48m daily except 331mM, W, F.  Recheck in Three weeks.

## 2019-10-19 ENCOUNTER — Other Ambulatory Visit: Payer: Self-pay | Admitting: Physician Assistant

## 2019-10-19 NOTE — Telephone Encounter (Signed)
LVMTCBx2.

## 2019-10-19 NOTE — Telephone Encounter (Signed)
Please see duplicate message. Questions for the appeal were answered via the telephone with Lorriane Shire from insurance company 10/19/19.

## 2019-10-19 NOTE — Telephone Encounter (Signed)
Requested Prescriptions  Pending Prescriptions Disp Refills  . losartan (COZAAR) 100 MG tablet [Pharmacy Med Name: LOSARTAN POTASSIUM 100 MG TAB] 90 tablet 0    Sig: TAKE ONE TABLET EVERY DAY     Cardiovascular:  Angiotensin Receptor Blockers Failed - 10/19/2019 10:07 AM      Failed - Cr in normal range and within 180 days    Creatinine, Ser  Date Value Ref Range Status  07/29/2019 1.99 (H) 0.44 - 1.00 mg/dL Final         Passed - K in normal range and within 180 days    Potassium  Date Value Ref Range Status  07/29/2019 3.9 3.5 - 5.1 mmol/L Final         Passed - Patient is not pregnant      Passed - Last BP in normal range    BP Readings from Last 1 Encounters:  08/04/19 132/62         Passed - Valid encounter within last 6 months    Recent Outpatient Visits          1 week ago Chronic pulmonary embolism, unspecified pulmonary embolism type, unspecified whether acute cor pulmonale present [facility])   [facility] [place], Adriana M, PA-C   2 months ago History of stroke   [place], [place], PA-C   3 months ago Diabetes mellitus without complication [facility])   [place], PA-C   6 months ago Congestive heart failure, unspecified HF chronicity, unspecified heart failure type [facility])   [facility] [place], [place]      Future Appointments            In 3 months Terrilee Croak, Wendee Beavers, PA-C [employer], [facility]

## 2019-10-19 NOTE — Telephone Encounter (Signed)
Spoke to patient and she states that her blood pressure is doing a lot better. Patient states she will call the office back if she needs appointment. FYI

## 2019-10-19 NOTE — Telephone Encounter (Signed)
Ann Flynn called back and asked a series of questions for the medication tadalafil in regards to the appeal. I answered the questions and will await the decision on the approval.

## 2019-10-20 ENCOUNTER — Other Ambulatory Visit: Payer: Self-pay

## 2019-10-20 ENCOUNTER — Encounter: Payer: Medicare Other | Admitting: *Deleted

## 2019-10-20 DIAGNOSIS — I270 Primary pulmonary hypertension: Secondary | ICD-10-CM | POA: Diagnosis not present

## 2019-10-20 DIAGNOSIS — I272 Pulmonary hypertension, unspecified: Secondary | ICD-10-CM

## 2019-10-20 NOTE — Progress Notes (Signed)
Daily Session Note  Patient Details  Name: Ann Flynn MRN: [id number] Date of Birth: 06-Jun-1941 Referring Provider:     Pulmonary Rehab from 09/22/2019 in [facility] Cardiac and Pulmonary Rehab  Referring Provider  Glori Bickers MD      Encounter Date: 10/20/2019  Check In: Session Check In - 10/20/19 1120      Check-In   Supervising physician immediately available to respond to emergencies  See telemetry face sheet for immediately available ER MD    Staff Present  Renita Papa, RN BSN;Jeanna Durrell BS, Exercise Physiologist;Joseph Tessie Fass RCP,RRT,BSRT    Virtual Visit  No    Medication changes reported      No    Fall or balance concerns reported     No    Warm-up and Cool-down  Performed on first and last piece of equipment    Resistance Training Performed  Yes    VAD Patient?  No    PAD/SET Patient?  No      Pain Assessment   Currently in Pain?  No/denies          Social History   Tobacco Use  Smoking Status Never Smoker  Smokeless Tobacco Never Used    Goals Met:  Independence with exercise equipment Exercise tolerated well No report of cardiac concerns or symptoms Strength training completed today  Goals Unmet:  Not Applicable  Comments: Pt able to follow exercise prescription today without complaint.  Will continue to monitor for progression.    Dr. Emily Filbert is Medical Director for [place] [facility].

## 2019-10-21 ENCOUNTER — Telehealth: Payer: Self-pay | Admitting: Internal Medicine

## 2019-10-21 ENCOUNTER — Encounter: Payer: Self-pay | Admitting: Physician Assistant

## 2019-10-21 DIAGNOSIS — Z780 Asymptomatic menopausal state: Secondary | ICD-10-CM

## 2019-10-21 NOTE — Telephone Encounter (Signed)
Please see 10/18/2019 phone note.  Pt is aware that we are currently in the process of doing appeal for this medication.  Pt voiced her understanding and had no further questions.  Nothing further is needed.

## 2019-10-24 ENCOUNTER — Other Ambulatory Visit: Payer: Self-pay | Admitting: Physician Assistant

## 2019-10-24 NOTE — Telephone Encounter (Signed)
[name] for Ann Flynn with [facility] for update.

## 2019-10-25 ENCOUNTER — Encounter: Payer: Medicare Other | Admitting: *Deleted

## 2019-10-25 ENCOUNTER — Other Ambulatory Visit: Payer: Self-pay

## 2019-10-25 DIAGNOSIS — I270 Primary pulmonary hypertension: Secondary | ICD-10-CM | POA: Diagnosis not present

## 2019-10-25 DIAGNOSIS — I272 Pulmonary hypertension, unspecified: Secondary | ICD-10-CM

## 2019-10-25 NOTE — Progress Notes (Signed)
Daily Session Note  Patient Details  Name: Ann Flynn MRN: [id number] Date of Birth: [birth year] Referring Provider:     Pulmonary Rehab from 09/22/2019 in [facility] Cardiac and Pulmonary Rehab  Referring Provider  Glori Bickers MD      Encounter Date: 10/25/2019  Check In:      Social History   Tobacco Use  Smoking Status Never Smoker  Smokeless Tobacco Never Used    Goals Met:  Proper associated with RPD/PD & O2 Sat Independence with exercise equipment Exercise tolerated well No report of cardiac concerns or symptoms  Goals Unmet:  Not Applicable  Comments: Pt able to follow exercise prescription today without complaint.  Will continue to monitor for progression.  Pulse oximeter not picking up HR. Placed on monitor to determine HR and rhythm. Found to be in A Fib. Note sent to referring physician. Patient is not aware of ever being in A fib. She is on Warfarin daily.  Dr. Emily Filbert is Medical Director for [place] and [facility].

## 2019-10-25 NOTE — Telephone Encounter (Addendum)
ATC Vanessa with [employer]- unable to leave vm due to mailbox being full.

## 2019-10-26 NOTE — Telephone Encounter (Signed)
Lm for Ann Flynn with [facility] for update on appeal.

## 2019-10-27 ENCOUNTER — Encounter: Payer: Medicare Other | Admitting: *Deleted

## 2019-10-27 ENCOUNTER — Telehealth (HOSPITAL_COMMUNITY): Payer: Self-pay | Admitting: Cardiology

## 2019-10-27 ENCOUNTER — Other Ambulatory Visit: Payer: Self-pay

## 2019-10-27 DIAGNOSIS — I272 Pulmonary hypertension, unspecified: Secondary | ICD-10-CM

## 2019-10-27 DIAGNOSIS — I270 Primary pulmonary hypertension: Secondary | ICD-10-CM | POA: Diagnosis not present

## 2019-10-27 NOTE — Progress Notes (Signed)
Daily Session Note  Patient Details  Name: Ann Flynn MRN: [id number] Date of Birth: 1940/09/19 Referring Provider:     Pulmonary Rehab from 09/22/2019 in [facility] Cardiac and Pulmonary Rehab  Referring Provider  Glori Bickers MD      Encounter Date: 10/27/2019  Check In: Session Check In - 10/27/19 1123      Check-In   Supervising physician immediately available to respond to emergencies  See telemetry face sheet for immediately available ER MD    Location  [facility]-Cardiac & Pulmonary Rehab    Staff Present  Renita Papa, RN BSN;Jeanna Durrell BS, Exercise Physiologist;Amanda Oletta Darter, BA, ACSM CEP, Exercise Physiologist    Virtual Visit  No    Medication changes reported      No    Fall or balance concerns reported     No    Warm-up and Cool-down  Performed on first and last piece of equipment    Resistance Training Performed  Yes    VAD Patient?  No    PAD/SET Patient?  No      Pain Assessment   Currently in Pain?  No/denies          Social History   Tobacco Use  Smoking Status Never Smoker  Smokeless Tobacco Never Used    Goals Met:  Proper associated with RPD/PD & O2 Sat Independence with exercise equipment Using PLB without cueing & demonstrates good technique Exercise tolerated well No report of cardiac concerns or symptoms Strength training completed today  Goals Unmet:  Not Applicable  Comments: Pt able to follow exercise prescription today without complaint.  Will continue to monitor for progression.    Dr. Emily Filbert is Medical Director for [place] and [facility].

## 2019-10-27 NOTE — Telephone Encounter (Signed)
Ann Flynn with [employer]  management called to report 8lb weight increase in the past week  Patient denied increased SOB however did report LE edema  Please advise

## 2019-10-27 NOTE — Telephone Encounter (Signed)
Lm for Ann Flynn with [employer].

## 2019-10-28 ENCOUNTER — Other Ambulatory Visit: Payer: Self-pay | Admitting: Physician Assistant

## 2019-10-28 ENCOUNTER — Telehealth: Payer: Self-pay | Admitting: Internal Medicine

## 2019-10-28 NOTE — Telephone Encounter (Signed)
Please see 10/18/2019 phone note.

## 2019-10-28 NOTE — Telephone Encounter (Signed)
Received call from [employer], who is requesting update on appeal for Cialis.  I have made Ann Flynn with [employer] aware that we spoke with Lorriane Shire with [facility] and started appeal process, however we have not received an update and have left multiple messages. I have left another message for Lorriane Shire and in the general mailbox for [facility] appeals teams.

## 2019-11-01 NOTE — Telephone Encounter (Signed)
There is still no update on the appeal for this medication. Will keep a look out for the fax.

## 2019-11-02 NOTE — Telephone Encounter (Signed)
Attempted to return call No answer LMOM [phone] (M)

## 2019-11-03 ENCOUNTER — Encounter: Payer: Medicare Other | Admitting: *Deleted

## 2019-11-03 ENCOUNTER — Other Ambulatory Visit: Payer: Self-pay

## 2019-11-03 DIAGNOSIS — I270 Primary pulmonary hypertension: Secondary | ICD-10-CM | POA: Diagnosis not present

## 2019-11-03 DIAGNOSIS — I272 Pulmonary hypertension, unspecified: Secondary | ICD-10-CM

## 2019-11-03 NOTE — Progress Notes (Signed)
Daily Session Note  Patient Details  Name: Ann Flynn MRN: [id number] Date of Birth: [birth year] Referring Provider:     Pulmonary Rehab from 09/22/2019 in [facility] Cardiac and Pulmonary Rehab  Referring Provider  Glori Bickers MD      Encounter Date: 11/03/2019  Check In: Session Check In - 11/03/19 1159      Check-In   Supervising physician immediately available to respond to emergencies  See telemetry face sheet for immediately available ER MD    Location  [facility]-Cardiac & Pulmonary Rehab    Staff Present  Renita Papa, RN BSN;Laureen Owens Shark, BS, RRT, CPFT;Amanda Oletta Darter, BA, ACSM CEP, Exercise Physiologist;Mary Kellie Shropshire, RN, BSN, MA    Virtual Visit  No    Medication changes reported      No    Fall or balance concerns reported     No    Warm-up and Cool-down  Performed on first and last piece of equipment    Resistance Training Performed  Yes    VAD Patient?  No    PAD/SET Patient?  No      Pain Assessment   Currently in Pain?  No/denies          Social History   Tobacco Use  Smoking Status Never Smoker  Smokeless Tobacco Never Used    Goals Met:  Independence with exercise equipment Exercise tolerated well No report of cardiac concerns or symptoms Strength training completed today  Goals Unmet:  Not Applicable  Comments: Pt able to follow exercise prescription today without complaint.  Will continue to monitor for progression.    Dr. Emily Filbert is Medical Director for [place] and [facility].

## 2019-11-05 ENCOUNTER — Other Ambulatory Visit: Payer: Self-pay

## 2019-11-05 ENCOUNTER — Emergency Department
Admission: EM | Admit: 2019-11-05 | Discharge: 2019-11-05 | Disposition: A | Discharge status: Home / Self Care | Payer: Medicare Other | Attending: Emergency Medicine | Admitting: Emergency Medicine

## 2019-11-05 ENCOUNTER — Encounter: Payer: Self-pay | Admitting: Emergency Medicine

## 2019-11-05 DIAGNOSIS — Z79899 Other long term (current) drug therapy: Secondary | ICD-10-CM | POA: Insufficient documentation

## 2019-11-05 DIAGNOSIS — I130 Hypertensive heart and chronic kidney disease with heart failure and stage 1 through stage 4 chronic kidney disease, or unspecified chronic kidney disease: Secondary | ICD-10-CM | POA: Diagnosis not present

## 2019-11-05 DIAGNOSIS — R140 Abdominal distension (gaseous): Secondary | ICD-10-CM | POA: Diagnosis not present

## 2019-11-05 DIAGNOSIS — Z794 Long term (current) use of insulin: Secondary | ICD-10-CM | POA: Diagnosis not present

## 2019-11-05 DIAGNOSIS — N184 Chronic kidney disease, stage 4 (severe): Secondary | ICD-10-CM | POA: Insufficient documentation

## 2019-11-05 DIAGNOSIS — R109 Unspecified abdominal pain: Secondary | ICD-10-CM | POA: Diagnosis present

## 2019-11-05 DIAGNOSIS — I509 Heart failure, unspecified: Secondary | ICD-10-CM | POA: Insufficient documentation

## 2019-11-05 DIAGNOSIS — Z7901 Long term (current) use of anticoagulants: Secondary | ICD-10-CM | POA: Insufficient documentation

## 2019-11-05 DIAGNOSIS — E1122 Type 2 diabetes mellitus with diabetic chronic kidney disease: Secondary | ICD-10-CM | POA: Diagnosis not present

## 2019-11-05 DIAGNOSIS — N10 Acute tubulo-interstitial nephritis: Secondary | ICD-10-CM | POA: Insufficient documentation

## 2019-11-05 LAB — BASIC METABOLIC PANEL
Anion gap: 8 (ref 5–15)
BUN: 47 mg/dL — ABNORMAL HIGH (ref 8–23)
CO2: 28 mmol/L (ref 22–32)
Calcium: 8.8 mg/dL — ABNORMAL LOW (ref 8.9–10.3)
Chloride: 106 mmol/L (ref 98–111)
Creatinine, Ser: 1.69 mg/dL — ABNORMAL HIGH (ref 0.44–1.00)
GFR calc Af Amer: 33 mL/min — ABNORMAL LOW (ref >60)
GFR calc non Af Amer: 29 mL/min — ABNORMAL LOW (ref >60)
Glucose, Bld: 99 mg/dL (ref 70–99)
Potassium: 4.2 mmol/L (ref 3.5–5.1)
Sodium: 142 mmol/L (ref 135–145)

## 2019-11-05 LAB — URINALYSIS, COMPLETE (UACMP) WITH MICROSCOPIC
Bilirubin Urine: NEGATIVE
Glucose, UA: NEGATIVE mg/dL
Ketones, ur: NEGATIVE mg/dL
Nitrite: POSITIVE — AB
Protein, ur: 30 mg/dL — AB
Specific Gravity, Urine: 1.005 (ref 1.005–1.030)
WBC, UA: >50 WBC/hpf — ABNORMAL HIGH (ref 0–5)
pH: 5 (ref 5.0–8.0)

## 2019-11-05 LAB — CBC
HCT: 34.5 % — ABNORMAL LOW (ref 36.0–46.0)
Hemoglobin: 10.4 g/dL — ABNORMAL LOW (ref 12.0–15.0)
MCH: 28.8 pg (ref 26.0–34.0)
MCHC: 30.1 g/dL (ref 30.0–36.0)
MCV: 95.6 fL (ref 80.0–100.0)
Platelets: 247 10*3/uL (ref 150–400)
RBC: 3.61 MIL/uL — ABNORMAL LOW (ref 3.87–5.11)
RDW: 15.4 % (ref 11.5–15.5)
WBC: 5.5 10*3/uL (ref 4.0–10.5)
nRBC: 0 % (ref 0.0–0.2)

## 2019-11-05 MED ORDER — LEVOFLOXACIN 250 MG PO TABS: ORAL_TABLET | ORAL | 0 refills | Status: AC

## 2019-11-05 NOTE — ED Triage Notes (Signed)
Pt c/o right side flank area pain since this morning. Pt concerned for kidney infection.  Pt c/o feeling bloated and burping more than usual.  Pt wears 3L [place] continuous, pt placed on hospital tank during triage.

## 2019-11-05 NOTE — Discharge Instructions (Signed)
Please follow up with your primary care provider in a week or so to make sure the infection has cleared completely.  Return to the ER for increase in pain, fever, or other concerns.  Be aware that antibiotic may affect your coumadin level. Follow your PCP recommendation for rechecking it.

## 2019-11-05 NOTE — ED Provider Notes (Signed)
[facility] Emergency Department Provider Note ____________________________________________   First MD Initiated Contact with Patient 11/05/19 1326     (approximate)  I have reviewed the triage vital signs and the nursing notes.   HISTORY  Chief Complaint Back Pain  HPI Ann Flynn is a 79 y.o. female presents to the emergency department for treatment and evaluation of right flank pain.  Symptoms started this morning.  She is concerned that she has a kidney infection. Mild dysuria. She also states that she has felt a little bit more bloated and has had more gas than usual lately.  She denies nausea, vomiting, diarrhea, fever, exposure to COVID-19, or other symptoms of concern.  Significant past medical history includes chronic pulmonary embolism, chronic respiratory failure with hypoxia, hypertension, CKD, CHF, diabetes, and pulmonary hypertension.      History reviewed. No pertinent past medical history.  Patient Active Problem List   Diagnosis Date Noted  . Chronic pulmonary embolism, unspecified pulmonary embolism type, unspecified whether acute cor pulmonale present ([place]) 10/06/2019  . Chronic respiratory failure with hypoxia ([place]) 10/06/2019  . Morbid (severe) obesity due to excess calories ([place]) 10/06/2019  . Hypertension associated with diabetes ([place]) 10/06/2019  . Hyperlipidemia associated with type 2 diabetes mellitus ([place]) 10/06/2019  . CKD (chronic kidney disease), stage IV ([place]) 04/01/2019  . Allergic rhinitis 03/28/2019  . Arthritis 03/28/2019  . CHF (congestive heart failure) ([place]) 03/28/2019  . Diabetes mellitus without complication ([place]) 96/88/6484  . Sleep apnea 03/28/2019  . Pulmonary hypertension, primary ([place]) 03/28/2019    Past Surgical History:  Procedure Laterality Date  . ABDOMINAL HYSTERECTOMY  1997  . cartiod artery surgery  2010   per patient   . CHOLECYSTECTOMY  10/19/2009  . MASTECTOMY  1994  . RIGHT HEART CATH N/A  07/29/2019   Procedure: RIGHT HEART CATH;  Surgeon: Jolaine Artist, MD;  Location: [facility];  Service: Cardiovascular;  Laterality: N/A;    Prior to Admission medications   Medication Sig Start Date End Date Taking? Authorizing Provider  furosemide (LASIX) 40 MG tablet TAKE 1 TABLET BY MOUTH DAILY 10/06/19   Trinna Post, PA-C  acetaminophen (TYLENOL) 500 MG tablet Take 500 mg by mouth 2 (two) times daily as needed for moderate pain.    [provider]  ambrisentan (LETAIRIS) 5 MG tablet Take 1 tablet (5 mg total) by mouth daily. 10/10/19   Flora Lipps, MD  amLODipine (NORVASC) 5 MG tablet Take 1 tablet (5 mg total) by mouth daily. Patient taking differently: Take 5 mg by mouth every evening.  06/23/19   Bensimhon, Shaune Pascal, MD  atorvastatin (LIPITOR) 80 MG tablet TAKE 1 TABLET BY MOUTH DAILY 10/05/19   Trinna Post, PA-C  BD PEN NEEDLE NANO U/F 32G X 4 MM MISC USE TO INJECT ONCE DAILY AS DIRECTED 08/02/19   Trinna Post, PA-C  Cyanocobalamin (B-12 PO) Take 1 tablet by mouth daily.    [provider]  docusate sodium (COLACE) 100 MG capsule Take 100 mg by mouth at bedtime.    [provider]  Dulaglutide (TRULICITY) 7.20 TK/1.8CE SOPN Inject 0.75 mg into the skin once a week. Patient taking differently: Inject 0.75 mg into the skin every Monday.  07/06/19   Trinna Post, PA-C  JANUVIA 25 MG tablet TAKE ONE TABLET BY MOUTH EVERY DAY 10/05/19   Trinna Post, PA-C  levofloxacin (LEVAQUIN) 250 MG tablet Take 2 tablets (500 mg total) by mouth daily  for 1 day, THEN 1 tablet (250 mg total) daily for 6 days. 11/05/19 11/12/19  Avangeline Stockburger, Johnette Abraham B, FNP  losartan (COZAAR) 100 MG tablet TAKE ONE TABLET EVERY DAY 10/19/19   Trinna Post, PA-C  metoprolol tartrate (LOPRESSOR) 25 MG tablet Take 1 tablet (25 mg total) by mouth 2 (two) times daily. 09/02/19   Trinna Post, PA-C  omeprazole (PRILOSEC) 40 MG capsule TAKE 1 CAPSULE BY MOUTH ONCE  DAILY 10/24/19   Trinna Post, PA-C  tadalafil (CIALIS) 20 MG tablet Take 2 tablets (40 mg total) by mouth daily. 10/10/19   Flora Lipps, MD  TOUJEO SOLOSTAR 300 UNIT/ML SOPN INJECT 50 UNITS INTO THE SKIN DAILY 10/19/19   Trinna Post, PA-C  venlafaxine [facility]) 75 MG tablet TAKE ONE TABLET EVERY DAY 10/24/19   Trinna Post, PA-C  VITAMIN D PO Take 1 capsule by mouth daily.    [provider]  warfarin (COUMADIN) 3 MG tablet Take 3 mg on MWF along with 4 mg on the remaining days. 10/04/19   Trinna Post, PA-C  warfarin (COUMADIN) 4 MG tablet Take 2-4 mg by mouth See admin instructions. Take 4 mg at night on Sun, Tue, Thurs, and Sat, take 2 mg at night on Mon, Wed, and Fri    [provider]    Allergies Penicillins and Sulfa antibiotics  No family history on file.  Social History Social History   Tobacco Use  . Smoking status: Never Smoker  . Smokeless tobacco: Never Used  Substance Use Topics  . Alcohol use: Never  . Drug use: Never    Review of Systems  Constitutional: No fever/chills Eyes: No visual changes. ENT: No sore throat. Cardiovascular: Denies chest pain. Respiratory: Denies shortness of breath beyond baseline. Gastrointestinal: No abdominal pain. No nausea, no vomiting.  No diarrhea.  No constipation. Genitourinary: Positive for dysuria. Musculoskeletal: Negative for back pain. Skin: Negative for rash. Neurological: Negative for headaches, focal weakness or numbness. ____________________________________________   PHYSICAL EXAM:  VITAL SIGNS: ED Triage Vitals  Enc Vitals Group     BP 11/05/19 1143 104/76     Pulse Rate 11/05/19 1143 74     Resp 11/05/19 1143 18     Temp 11/05/19 1143 98.1 F (36.7 C)     Temp Source 11/05/19 1143 Oral     SpO2 11/05/19 1143 99 %     Weight 11/05/19 1143 250 lb (113.4 kg)     Height 11/05/19 1143 _0  (1.651 m)     Head Circumference --      Peak Flow --      Pain Score 11/05/19 1151 7      Pain Loc --      Pain Edu? --      Excl. in [place]? --     Constitutional: Alert and oriented. Well appearing and in no acute distress. Eyes: Conjunctivae are normal. PERRL. EOMI. Head: Atraumatic. Nose: No congestion/rhinnorhea. Mouth/Throat: Mucous membranes are moist.  Oropharynx non-erythematous. Neck: No stridor.   Hematological/Lymphatic/Immunilogical: No cervical lymphadenopathy. Cardiovascular: Normal rate, regular rhythm. Grossly normal heart sounds.  Good peripheral circulation. Respiratory: Normal respiratory effort.  No retractions. Lungs CTAB. Gastrointestinal: Soft and nontender. No distention. No abdominal bruits. No CVA tenderness. Genitourinary:  Musculoskeletal: No lower extremity tenderness nor edema.  No joint effusions. Neurologic:  Normal speech and language. No gross focal neurologic deficits are appreciated. No gait instability. Skin:  Skin is warm, dry and intact. No rash noted. Psychiatric: Mood  and affect are normal. Speech and behavior are normal.  ____________________________________________   LABS (all labs ordered are listed, but only abnormal results are displayed)  Labs Reviewed  URINALYSIS, COMPLETE (UACMP) WITH MICROSCOPIC - Abnormal; Notable for the following components:      Result Value   Color, Urine YELLOW (*)    APPearance HAZY (*)    Hgb urine dipstick SMALL (*)    Protein, ur 30 (*)    Nitrite POSITIVE (*)    Leukocytes,Ua LARGE (*)    WBC, UA >50 (*)    Bacteria, UA MANY (*)    All other components within normal limits  BASIC METABOLIC PANEL - Abnormal; Notable for the following components:   BUN 47 (*)    Creatinine, Ser 1.69 (*)    Calcium 8.8 (*)    GFR calc non Af Amer 29 (*)    GFR calc Af Amer 33 (*)    All other components within normal limits  CBC - Abnormal; Notable for the following components:   RBC 3.61 (*)    Hemoglobin 10.4 (*)    HCT 34.5 (*)    All other components within normal limits    ____________________________________________  EKG  Not indicated. ____________________________________________  RADIOLOGY  ED MD interpretation:    Official radiology report(s): No results found.  ____________________________________________   PROCEDURES  Procedure(s) performed (including Critical Care):  Procedures  ____________________________________________   INITIAL IMPRESSION / ASSESSMENT AND PLAN     79 year old female presents to the emergency department for treatment and evaluation of flank pain.  See HPI for further details.  DIFFERENTIAL DIAGNOSIS  Pyelonephritis, acute cystitis, stone disease  ED COURSE  While awaiting ER room assignment, protocol labs were drawn.  White blood cell count is normal at 5.5.  Hemoglobin of 10.4 appears to be baseline chronic anemia.  BUN and creatinine of 47 and 1.69 are also at patient's baseline.  Urinalysis shows small amount of hemoglobin, positive nitrates, large leukocytes, greater than 50 white blood cells, and many bacteria with clumps of white blood cells.  She does have some mild dysuria and right flank pain.  This is likely a very early pyelonephritis.  She is not tachypneic, tachycardic, hypotensive, and is not febrile.  No sign or indication of sepsis.  She will be treated with Levaquin.  Due to her chronic kidney disease, the Levaquin has been dosed at 500 mg today then 250 mg for 6 days.  She was strongly encouraged to follow-up with her primary care provider in a week or so to make sure that the infection looks like it has cleared.  She is to return to the emergency department for symptoms of change or worsen if unable to schedule an appointment. ____________________________________________   FINAL CLINICAL IMPRESSION(S) / ED DIAGNOSES  Final diagnoses:  Pyelonephritis, acute     ED Discharge Orders         Ordered    levofloxacin (LEVAQUIN) 250 MG tablet     11/05/19 1352           Ann Flynn  was evaluated in Emergency Department on 11/05/2019 for the symptoms described in the history of present illness. She was evaluated in the context of the global COVID-19 pandemic, which necessitated consideration that the patient might be at risk for infection with the SARS-CoV-2 virus that causes COVID-19. Institutional protocols and algorithms that pertain to the evaluation of patients at risk for COVID-19 are in a state of rapid change based on  information released by regulatory bodies including the CDC and federal and state organizations. These policies and algorithms were followed during the patient's care in the ED.   Note:  This document was prepared using Dragon voice recognition software and may include unintentional dictation errors.   Victorino Dike, FNP 11/05/19 2044    Earleen Newport, MD 11/06/19 1058

## 2019-11-07 ENCOUNTER — Other Ambulatory Visit: Payer: Self-pay | Admitting: Physician Assistant

## 2019-11-07 ENCOUNTER — Ambulatory Visit
Admission: RE | Admit: 2019-11-07 | Discharge: 2019-11-07 | Disposition: A | Discharge status: Home / Self Care | Payer: Medicare Other | Source: Ambulatory Visit | Attending: Physician Assistant | Admitting: Physician Assistant

## 2019-11-07 DIAGNOSIS — Z8673 Personal history of transient ischemic attack (TIA), and cerebral infarction without residual deficits: Secondary | ICD-10-CM

## 2019-11-07 DIAGNOSIS — Z780 Asymptomatic menopausal state: Secondary | ICD-10-CM | POA: Insufficient documentation

## 2019-11-07 DIAGNOSIS — I270 Primary pulmonary hypertension: Secondary | ICD-10-CM

## 2019-11-07 MED ORDER — TADALAFIL (PAH) 20 MG PO TABS: 40.0000 mg | ORAL_TABLET | Freq: Every day | ORAL | 1 refills | Status: DC

## 2019-11-07 NOTE — Telephone Encounter (Signed)
Pt aware that med approved and I have sent the rx tadalafil PAH to [facility]

## 2019-11-07 NOTE — Telephone Encounter (Signed)
Pt called back-- still having issues-- insurance company won't cover the medication. Please call back.

## 2019-11-07 NOTE — Telephone Encounter (Signed)
Spoke with the pt  She states still has not heard anything on her tadalafil   I am wondering if the pharmacy team can please help with this  I wonder if it got submitted for sexual dysfunction rather than her pulmonary issues bc I have seen this happen with another pt before and of course med was denied.   Pharm team- can you please help, thanks so much!!

## 2019-11-07 NOTE — Telephone Encounter (Signed)
Received notification from [facility] regarding a prior authorization for TADALAFIL PAH 66m. Authorization has been APPROVED from 11/07/19 to 08/17/20.   Authorization # (Key:Mitchell Heir -- [id number] # [id number] Ran test claim, patient's copay is $47.62 for 1 month supply. Please send in prescription for Tadalafil PAH.  4:23 PM RBeatriz Flynn CPhT

## 2019-11-08 ENCOUNTER — Encounter: Payer: Medicare Other | Admitting: *Deleted

## 2019-11-08 ENCOUNTER — Telehealth: Payer: Self-pay

## 2019-11-08 ENCOUNTER — Ambulatory Visit (INDEPENDENT_AMBULATORY_CARE_PROVIDER_SITE_OTHER): Payer: Medicare Other

## 2019-11-08 ENCOUNTER — Telehealth: Payer: Self-pay | Admitting: *Deleted

## 2019-11-08 ENCOUNTER — Other Ambulatory Visit: Payer: Self-pay | Admitting: Physician Assistant

## 2019-11-08 ENCOUNTER — Other Ambulatory Visit: Payer: Self-pay

## 2019-11-08 DIAGNOSIS — Z8673 Personal history of transient ischemic attack (TIA), and cerebral infarction without residual deficits: Secondary | ICD-10-CM

## 2019-11-08 DIAGNOSIS — I270 Primary pulmonary hypertension: Secondary | ICD-10-CM

## 2019-11-08 DIAGNOSIS — I272 Pulmonary hypertension, unspecified: Secondary | ICD-10-CM

## 2019-11-08 LAB — POCT INR
INR: 3.4 — AB (ref 2.0–3.0)
PT: 40.3

## 2019-11-08 NOTE — Progress Notes (Signed)
Daily Session Note  Patient Details  Name: Ann Flynn MRN: [id number] Date of Birth: Jun 13, 1941 Referring Provider:     Pulmonary Rehab from 09/22/2019 in [facility] Cardiac and Pulmonary Rehab  Referring Provider  Glori Bickers MD      Encounter Date: 11/08/2019  Check In: Session Check In - 11/08/19 1124      Check-In   Supervising physician immediately available to respond to emergencies  See telemetry face sheet for immediately available ER MD    Location  [facility]-Cardiac & Pulmonary Rehab    Staff Present  Renita Papa, RN BSN;Joseph Foy [place], [place], ACSM CEP, Exercise Physiologist    Virtual Visit  No    Medication changes reported      No    Warm-up and Cool-down  Performed on first and last piece of equipment    Resistance Training Performed  Yes    VAD Patient?  No    PAD/SET Patient?  No      Pain Assessment   Currently in Pain?  No/denies          Social History   Tobacco Use  Smoking Status Never Smoker  Smokeless Tobacco Never Used    Goals Met:  Independence with exercise equipment Exercise tolerated well No report of cardiac concerns or symptoms Strength training completed today  Goals Unmet:  Not Applicable  Comments: Pt able to follow exercise prescription today without complaint.  Will continue to monitor for progression.    Dr. Emily Filbert is Medical Director for [place] and [facility].

## 2019-11-08 NOTE — Telephone Encounter (Signed)
-----  Message from Lynford Humphrey, RN sent at 11/03/2019 12:37 PM EDT ----- Regarding: need form completed Hello,  I sent an FYI on Ann Flynn to Dr [name], then it was forwarded to Dr Rockey Situ.  Dr Rockey Situ signed the form. He did not check off what we should do .  I can refax the form for him to check off response if that helps.  She had episode of Atrial Fibrillation during her session.  Need to know if there are any concerns with her resuming exercise.  This A fib seems to be new . Could not find any notes nor EKG that talked about A fib.   Just need to know if safe for her to exercise.  Thanks,   Ann Flynn

## 2019-11-08 NOTE — Patient Instructions (Signed)
Description   Current dose: 13m daily except 334mM, W, F.  Changes today: 36m48mn T,TH,Sat except 4 mg the other days. Recheck in 2 weeks

## 2019-11-08 NOTE — Telephone Encounter (Signed)
-----  Message from Mar Daring, [place] sent at 11/07/2019 12:13 PM EDT ----- Bone density was essentially normal. The right neck of the femur (thigh bone) does have some mild thinning of the bone starting. May repeat bone density in 5 years if desired.

## 2019-11-08 NOTE — Telephone Encounter (Signed)
Patient of Fabio Bering, Fabio Bering is out of the office

## 2019-11-08 NOTE — Telephone Encounter (Addendum)
Left voicemail message for patient to call back about rehab and follow up appointment. Forms were completed and patient may continue with outpatient rehab services as she is covered on anticoagulation.

## 2019-11-08 NOTE — Telephone Encounter (Signed)
Result Communications   Result Notes and Comments to Patient Comment seen by patient Ann Flynn on 11/07/2019 10:40 PM EDT

## 2019-11-09 ENCOUNTER — Encounter: Payer: Self-pay | Admitting: *Deleted

## 2019-11-09 DIAGNOSIS — I272 Pulmonary hypertension, unspecified: Secondary | ICD-10-CM

## 2019-11-09 NOTE — Progress Notes (Signed)
Pulmonary Individual Treatment Plan  Patient Details  Name: Ann Flynn MRN: [id number] Date of Birth: 1940/09/03 Referring Provider:     Pulmonary Rehab from 09/22/2019 in [facility] Cardiac and Pulmonary Rehab  Referring Provider  Glori Bickers MD      Initial Encounter Date:    Pulmonary Rehab from 09/22/2019 in [facility] Cardiac and Pulmonary Rehab  Date  09/22/19      Visit Diagnosis: Pulmonary hypertension ([place])  Patient's Home Medications on Admission:  Current Outpatient Medications:  .  furosemide (LASIX) 40 MG tablet, TAKE 1 TABLET BY MOUTH DAILY, Disp: 90 tablet, Rfl: 1 .  acetaminophen (TYLENOL) 500 MG tablet, Take 500 mg by mouth 2 (two) times daily as needed for moderate pain., Disp: , Rfl:  .  ambrisentan (LETAIRIS) 5 MG tablet, Take 1 tablet (5 mg total) by mouth daily., Disp: 90 tablet, Rfl: 1 .  amLODipine (NORVASC) 5 MG tablet, Take 1 tablet (5 mg total) by mouth daily. (Patient taking differently: Take 5 mg by mouth every evening. ), Disp: 30 tablet, Rfl: 6 .  atorvastatin (LIPITOR) 80 MG tablet, TAKE 1 TABLET BY MOUTH DAILY, Disp: 90 tablet, Rfl: 0 .  BD PEN NEEDLE NANO U/F 32G X 4 MM MISC, USE TO INJECT ONCE DAILY AS DIRECTED, Disp: 100 each, Rfl: 0 .  Cyanocobalamin (B-12 PO), Take 1 tablet by mouth daily., Disp: , Rfl:  .  docusate sodium (COLACE) 100 MG capsule, Take 100 mg by mouth at bedtime., Disp: , Rfl:  .  Dulaglutide (TRULICITY) 2.53 GU/4.4IH SOPN, Inject 0.75 mg into the skin once a week. (Patient taking differently: Inject 0.75 mg into the skin every Monday. ), Disp: 4 pen, Rfl: 0 .  JANUVIA 25 MG tablet, TAKE ONE TABLET BY MOUTH EVERY DAY, Disp: 90 tablet, Rfl: 0 .  levofloxacin (LEVAQUIN) 250 MG tablet, Take 2 tablets (500 mg total) by mouth daily for 1 day, THEN 1 tablet (250 mg total) daily for 6 days., Disp: 8 tablet, Rfl: 0 .  losartan (COZAAR) 100 MG tablet, TAKE ONE TABLET EVERY DAY, Disp: 90 tablet, Rfl: 0 .  metoprolol tartrate (LOPRESSOR) 25 MG  tablet, Take 1 tablet (25 mg total) by mouth 2 (two) times daily., Disp: 90 tablet, Rfl: 1 .  omeprazole (PRILOSEC) 40 MG capsule, TAKE 1 CAPSULE BY MOUTH ONCE DAILY, Disp: 90 capsule, Rfl: 0 .  tadalafil, PAH, (ADCIRCA) 20 MG tablet, Take 2 tablets (40 mg total) by mouth daily., Disp: 180 tablet, Rfl: 1 .  TOUJEO SOLOSTAR 300 UNIT/ML SOPN, INJECT 50 UNITS INTO THE SKIN DAILY, Disp: 4.5 mL, Rfl: 2 .  venlafaxine (EFFEXOR) 75 MG tablet, TAKE ONE TABLET EVERY DAY, Disp: 90 tablet, Rfl: 0 .  VITAMIN D PO, Take 1 capsule by mouth daily., Disp: , Rfl:  .  warfarin (COUMADIN) 3 MG tablet, Take 3 mg on MWF along with 4 mg on the remaining days., Disp: 30 tablet, Rfl: 0 .  warfarin (COUMADIN) 4 MG tablet, Take 2-4 mg by mouth See admin instructions. Take 4 mg at night on Sun, Tue, Thurs, and Sat, take 2 mg at night on Mon, Wed, and Fri, Disp: , Rfl:   Past Medical History: No past medical history on file.  Tobacco Use: Social History   Tobacco Use  Smoking Status Never Smoker  Smokeless Tobacco Never Used    Labs: Recent Review Flowsheet Data    Labs for ITP Cardiac and Pulmonary Rehab Latest Ref Rng & Units 03/28/2019 07/06/2019 07/29/2019 07/29/2019 07/29/2019  Cholestrol 100 - 199 mg/dL 186 - - - -   LDLCALC 0 - 99 mg/dL 95 - - - -   HDL >39 mg/dL 43 - - - -   Trlycerides 0 - 149 mg/dL 240(H) - - - -   Hemoglobin A1c 4.0 - 5.6 % 7.2(H) 7.3(A) - - -   HCO3 20.0 - 28.0 mmol/L - - 26.3 26.7 26.9   TCO2 22 - 32 mmol/L - - _0 O2SAT % - - 75.0 74.0 72.0       Pulmonary Assessment Scores: Pulmonary Assessment Scores    Row Name 09/22/19 1543         ADL UCSD   ADL Phase  Entry     SOB Score total  42     Rest  0     Walk  2     Stairs  5     Bath  1     Dress  1     Shop  2       CAT Score   CAT Score  22       mMRC Score   mMRC Score  2        UCSD: Self-administered rating of dyspnea associated with activities of daily living (ADLs) 6-point scale (0 = "not at  all" to 5 = "maximal or unable to do because of breathlessness")  Scoring Scores range from 0 to 120.  Minimally important difference is 5 units  CAT: CAT can identify the health impairment of COPD patients and is better correlated with disease progression.  CAT has a scoring range of zero to 40. The CAT score is classified into four groups of low (less than 10), medium (10 - 20), high (21-30) and very high (31-40) based on the impact level of disease on health status. A CAT score over 10 suggests significant symptoms.  A worsening CAT score could be explained by an exacerbation, poor medication adherence, poor inhaler technique, or progression of COPD or comorbid conditions.  CAT MCID is 2 points  mMRC: mMRC (Modified Medical Research Council) Dyspnea Scale is used to assess the degree of baseline functional disability in patients of respiratory disease due to dyspnea. No minimal important difference is established. A decrease in score of 1 point or greater is considered a positive change.   Pulmonary Function Assessment: Pulmonary Function Assessment - 09/22/19 1543      Breath   Shortness of Breath  Yes;Fear of Shortness of Breath;Limiting activity       Exercise Target Goals: Exercise Program Goal: Individual exercise prescription set using results from initial 6 min walk test and THRR while considering  patient's activity barriers and safety.   Exercise Prescription Goal: Initial exercise prescription builds to 30-45 minutes a day of aerobic activity, 2-3 days per week.  Home exercise guidelines will be given to patient during program as part of exercise prescription that the participant will acknowledge.  Activity Barriers & Risk Stratification: Activity Barriers & Cardiac Risk Stratification - 09/21/19 0808      Activity Barriers & Cardiac Risk Stratification   Activity Barriers  Arthritis;Joint Problems;Other (comment);Deconditioning;Muscular Weakness;Shortness of  Breath;Balance Concerns    Comments  Right leg/hip flaring up occassionally (possible sciattica)       6 Minute Walk: 6 Minute Walk    Row Name 09/22/19 1534         6 Minute Walk   Phase  Initial     Distance  400 feet     Walk Time  5 minutes     # of Rest Breaks  0 stopped at 5 min     MPH  0.91     METS  0.56     RPE  17     Perceived Dyspnea   3     VO2 Peak  1.95     Symptoms  Yes (comment)     Comments  dizzy, hips hurting 8/10     Resting HR  62 bpm     Resting BP  134/72     Resting Oxygen Saturation   99 %     Exercise Oxygen Saturation  during 6 min walk  86 %     Max Ex. HR  120 bpm     Max Ex. BP  152/84     2 Minute Post BP  134/74       Interval HR   1 Minute HR  95     2 Minute HR  113     3 Minute HR  117     4 Minute HR  119     5 Minute HR  120     6 Minute HR  111     2 Minute Post HR  87     Interval Heart Rate?  Yes       Interval Oxygen   Interval Oxygen?  Yes     Baseline Oxygen Saturation %  99 %     1 Minute Oxygen Saturation %  95 %     1 Minute Liters of Oxygen  3 L     2 Minute Oxygen Saturation %  94 %     2 Minute Liters of Oxygen  3 L     3 Minute Oxygen Saturation %  91 %     3 Minute Liters of Oxygen  3 L     4 Minute Oxygen Saturation %  85 %     4 Minute Liters of Oxygen  3 L     5 Minute Oxygen Saturation %  86 %     5 Minute Liters of Oxygen  3 L     6 Minute Oxygen Saturation %  90 %     6 Minute Liters of Oxygen  3 L     2 Minute Post Oxygen Saturation %  98 %     2 Minute Post Liters of Oxygen  3 L       Oxygen Initial Assessment: Oxygen Initial Assessment - 09/21/19 0811      Home Oxygen   Home Oxygen Device  Home Concentrator;E-Tanks;Portable Concentrator   just approved for portable concentrator   Sleep Oxygen Prescription  CPAP;Continuous    Liters per minute  2   11 setting   Home Exercise Oxygen Prescription  Continuous   new concentrator will be pulsed   Liters per minute  3    Home at Rest  Exercise Oxygen Prescription  Continuous    Liters per minute  2.5    Compliance with Home Oxygen Use  Yes      Initial 6 min Walk   Oxygen Used  Continuous;E-Tanks   may want to test new concentrator once it arrives   Liters per minute  3      Program Oxygen Prescription   Program Oxygen Prescription  Continuous;E-Tanks    Liters per minute  3      Intervention  Short Term Goals  To learn and exhibit compliance with exercise, home and travel O2 prescription;To learn and understand importance of maintaining oxygen saturations>88%;To learn and understand importance of monitoring SPO2 with pulse oximeter and demonstrate accurate use of the pulse oximeter.;To learn and demonstrate proper pursed lip breathing techniques or other breathing techniques.;To learn and demonstrate proper use of respiratory medications   has pulse oximeter   Long  Term Goals  Exhibits compliance with exercise, home and travel O2 prescription;Verbalizes importance of monitoring SPO2 with pulse oximeter and return demonstration;Maintenance of O2 saturations>88%;Exhibits proper breathing techniques, such as pursed lip breathing or other method taught during program session;Compliance with respiratory medication;Demonstrates proper use of MDI's       Oxygen Re-Evaluation: Oxygen Re-Evaluation    Row Name 09/27/19 1123             Program Oxygen Prescription   Program Oxygen Prescription  Continuous;E-Tanks       Liters per minute  3         Home Oxygen   Home Oxygen Device  Home Concentrator;E-Tanks;Portable Concentrator       Sleep Oxygen Prescription  CPAP;Continuous       Liters per minute  2       Liters per minute  3       Home at Rest Exercise Oxygen Prescription  Continuous       Liters per minute  2.5       Compliance with Home Oxygen Use  Yes         Goals/Expected Outcomes   Short Term Goals  To learn and exhibit compliance with exercise, home and travel O2 prescription;To learn and understand  importance of maintaining oxygen saturations>88%;To learn and understand importance of monitoring SPO2 with pulse oximeter and demonstrate accurate use of the pulse oximeter.;To learn and demonstrate proper pursed lip breathing techniques or other breathing techniques.;To learn and demonstrate proper use of respiratory medications       Long  Term Goals  Exhibits compliance with exercise, home and travel O2 prescription;Verbalizes importance of monitoring SPO2 with pulse oximeter and return demonstration;Maintenance of O2 saturations>88%;Exhibits proper breathing techniques, such as pursed lip breathing or other method taught during program session;Compliance with respiratory medication;Demonstrates proper use of MDI's       Comments  Reviewed PLB technique with pt.  Talked about how it works and it's importance in maintaining their exercise saturations.       Goals/Expected Outcomes  Short: Become more profiecient at using PLB.   Long: Become independent at using PLB.          Oxygen Discharge (Final Oxygen Re-Evaluation): Oxygen Re-Evaluation - 09/27/19 1123      Program Oxygen Prescription   Program Oxygen Prescription  Continuous;E-Tanks    Liters per minute  3      Home Oxygen   Home Oxygen Device  Home Concentrator;E-Tanks;Portable Concentrator    Sleep Oxygen Prescription  CPAP;Continuous    Liters per minute  2    Liters per minute  3    Home at Rest Exercise Oxygen Prescription  Continuous    Liters per minute  2.5    Compliance with Home Oxygen Use  Yes      Goals/Expected Outcomes   Short Term Goals  To learn and exhibit compliance with exercise, home and travel O2 prescription;To learn and understand importance of maintaining oxygen saturations>88%;To learn and understand importance of monitoring SPO2 with pulse oximeter and demonstrate accurate use of the pulse  oximeter.;To learn and demonstrate proper pursed lip breathing techniques or other breathing techniques.;To learn and  demonstrate proper use of respiratory medications    Long  Term Goals  Exhibits compliance with exercise, home and travel O2 prescription;Verbalizes importance of monitoring SPO2 with pulse oximeter and return demonstration;Maintenance of O2 saturations>88%;Exhibits proper breathing techniques, such as pursed lip breathing or other method taught during program session;Compliance with respiratory medication;Demonstrates proper use of MDI's    Comments  Reviewed PLB technique with pt.  Talked about how it works and it's importance in maintaining their exercise saturations.    Goals/Expected Outcomes  Short: Become more profiecient at using PLB.   Long: Become independent at using PLB.       Initial Exercise Prescription: Initial Exercise Prescription - 09/22/19 1500      Date of Initial Exercise RX and Referring Provider   Date  09/22/19    Referring Provider  Bensimhon, Daniel MD      Oxygen   Oxygen  Continuous    Liters  3      Treadmill   MPH  0.8    Grade  0    Minutes  15    METs  1.6      NuStep   Level  1    SPM  80    Minutes  15    METs  1.5      T5 Nustep   Level  1    SPM  80    Minutes  15    METs  1.5      Biostep-RELP   Level  1    SPM  50    Minutes  15    METs  1      Prescription Details   Frequency (times per week)  2    Duration  Progress to 30 minutes of continuous aerobic without signs/symptoms of physical distress      Intensity   THRR 40-80% of Max Heartrate  94-126    Ratings of Perceived Exertion  11-13    Perceived Dyspnea  0-4      Progression   Progression  Continue to progress workloads to maintain intensity without signs/symptoms of physical distress.      Resistance Training   Training Prescription  Yes    Weight  3 lb    Reps  10-15       Perform Capillary Blood Glucose checks as needed.  Exercise Prescription Changes: Exercise Prescription Changes    Row Name 09/22/19 1500 10/04/19 1100 10/19/19 0900 11/01/19 1100        Response to Exercise   Blood Pressure (Admit)  134/72  158/80  114/66  118/82    Blood Pressure (Exercise)  152/84  156/84  122/74  130/64    Blood Pressure (Exit)  126/72  126/64  102/60  106/62    Heart Rate (Admit)  62 bpm  78 bpm  85 bpm  80 bpm    Heart Rate (Exercise)  120 bpm  84 bpm  98 bpm  96 bpm    Heart Rate (Exit)  64 bpm  67 bpm  62 bpm  61 bpm    Oxygen Saturation (Admit)  99 %  97 %  90 %  94 %    Oxygen Saturation (Exercise)  85 %  95 %  93 %  97 %    Oxygen Saturation (Exit)  97 %  95 %  98 %  98 %  Rating of Perceived Exertion (Exercise)  _0 Perceived Dyspnea (Exercise)  _1 Symptoms  dizzy, hip hurting 8/10  --  fatigue  --    Comments  walk test results  first day  --  --    Duration  --  --  Continue with 30 min of aerobic exercise without signs/symptoms of physical distress.  Continue with 30 min of aerobic exercise without signs/symptoms of physical distress.    Intensity  --  --  THRR unchanged  THRR unchanged      Progression   Progression  --  --  Continue to progress workloads to maintain intensity without signs/symptoms of physical distress.  Continue to progress workloads to maintain intensity without signs/symptoms of physical distress.    Average METs  --  --  1.5  1.5      Resistance Training   Training Prescription  --  --  Yes  Yes    Weight  --  --  3 lb   3 lb    Reps  --  --  10-15  10-15      Interval Training   Interval Training  --  --  No  No      Treadmill   MPH  --  --  0.8  --    Grade  --  --  0  --    Minutes  --  --  15  --    METs  --  --  1.61  --      NuStep   Level  --  --  1  1    SPM  --  --  --  80    Minutes  --  --  15  15    METs  --  --  1.6  2      T5 Nustep   Level  --  --  1  --    Minutes  --  --  15  --    METs  --  --  1.8  --      Biostep-RELP   Level  --  --  1  1    SPM  --  --  --  50    Minutes  --  --  15  15    METs  --  --  1  1       Exercise  Comments: Exercise Comments    Row Name 10/25/19 1240           Exercise Comments  Pulse oximeter not picking up HR. Placed on monitor to determine HR and rhythm. Found to be in A Fib. Note sent to referring physician. Patient is not aware of ever being in A fib. She is on Warfarin daily.          Exercise Goals and Review: Exercise Goals    Row Name 09/22/19 1540             Exercise Goals   Increase Physical Activity  Yes       Intervention  Provide advice, education, support and counseling about physical activity/exercise needs.;Develop an individualized exercise prescription for aerobic and resistive training based on initial evaluation findings, risk stratification, comorbidities and participant's personal goals.       Expected Outcomes  Short Term: Attend rehab on a regular basis to increase amount of physical activity.;Long Term:  Add in home exercise to make exercise part of routine and to increase amount of physical activity.;Long Term: Exercising regularly at least 3-5 days a week.       Increase Strength and Stamina  Yes       Intervention  Provide advice, education, support and counseling about physical activity/exercise needs.;Develop an individualized exercise prescription for aerobic and resistive training based on initial evaluation findings, risk stratification, comorbidities and participant's personal goals.       Expected Outcomes  Short Term: Increase workloads from initial exercise prescription for resistance, speed, and METs.;Short Term: Perform resistance training exercises routinely during rehab and add in resistance training at home;Long Term: Improve cardiorespiratory fitness, muscular endurance and strength as measured by increased METs and functional capacity (6MWT)       Able to understand and use rate of perceived exertion (RPE) scale  Yes       Intervention  Provide education and explanation on how to use RPE scale       Expected Outcomes  Short Term: Able to  use RPE daily in rehab to express subjective intensity level;Long Term:  Able to use RPE to guide intensity level when exercising independently       Able to understand and use Dyspnea scale  Yes       Intervention  Provide education and explanation on how to use Dyspnea scale       Expected Outcomes  Short Term: Able to use Dyspnea scale daily in rehab to express subjective sense of shortness of breath during exertion;Long Term: Able to use Dyspnea scale to guide intensity level when exercising independently       Knowledge and understanding of Target Heart Rate Range (THRR)  Yes       Intervention  Provide education and explanation of THRR including how the numbers were predicted and where they are located for reference       Expected Outcomes  Short Term: Able to state/look up THRR;Short Term: Able to use daily as guideline for intensity in rehab;Long Term: Able to use THRR to govern intensity when exercising independently       Able to check pulse independently  Yes       Intervention  Provide education and demonstration on how to check pulse in carotid and radial arteries.;Review the importance of being able to check your own pulse for safety during independent exercise       Expected Outcomes  Short Term: Able to explain why pulse checking is important during independent exercise;Long Term: Able to check pulse independently and accurately       Understanding of Exercise Prescription  Yes       Intervention  Provide education, explanation, and written materials on patient's individual exercise prescription       Expected Outcomes  Short Term: Able to explain program exercise prescription;Long Term: Able to explain home exercise prescription to exercise independently          Exercise Goals Re-Evaluation : Exercise Goals Re-Evaluation    Row Name 09/27/19 1122 10/19/19 0942 11/01/19 1132         Exercise Goal Re-Evaluation   Exercise Goals Review  Increase Physical Activity;Able to  understand and use rate of perceived exertion (RPE) scale;Knowledge and understanding of Target Heart Rate Range (THRR);Understanding of Exercise Prescription;Increase Strength and Stamina;Able to understand and use Dyspnea scale;Able to check pulse independently  Increase Physical Activity;Increase Strength and Stamina;Understanding of Exercise Prescription  Increase Physical Activity;Increase Strength and Stamina;Able to  understand and use rate of perceived exertion (RPE) scale;Able to understand and use Dyspnea scale;Knowledge and understanding of Target Heart Rate Range (THRR);Able to check pulse independently;Understanding of Exercise Prescription     Comments  Reviewed RPE scale, THR and program prescription with pt today.  Pt voiced understanding and was given a copy of goals to take home.  Jerilee is off to a good start in rehab. She is already up to the full 15 min on the treadmill. We will continue to monitor her progress.  Vanna has increased from .6-.8 on TM.  Staff will monitor progress.     Expected Outcomes  Short: Use RPE daily to regulate intensity. Long: Follow program prescription in THR.  Short: Continue to attend rehab regularly.  Long: Continue to follow program prescription.  Short:  attend class consistently Long : improve overall stamina        Discharge Exercise Prescription (Final Exercise Prescription Changes): Exercise Prescription Changes - 11/01/19 1100      Response to Exercise   Blood Pressure (Admit)  118/82    Blood Pressure (Exercise)  130/64    Blood Pressure (Exit)  106/62    Heart Rate (Admit)  80 bpm    Heart Rate (Exercise)  96 bpm    Heart Rate (Exit)  61 bpm    Oxygen Saturation (Admit)  94 %    Oxygen Saturation (Exercise)  97 %    Oxygen Saturation (Exit)  98 %    Rating of Perceived Exertion (Exercise)  13    Perceived Dyspnea (Exercise)  2    Duration  Continue with 30 min of aerobic exercise without signs/symptoms of physical distress.    Intensity   THRR unchanged      Progression   Progression  Continue to progress workloads to maintain intensity without signs/symptoms of physical distress.    Average METs  1.5      Resistance Training   Training Prescription  Yes    Weight   3 lb    Reps  10-15      Interval Training   Interval Training  No      NuStep   Level  1    SPM  80    Minutes  15    METs  2      Biostep-RELP   Level  1    SPM  50    Minutes  15    METs  1       Nutrition:  Target Goals: Understanding of nutrition guidelines, daily intake of sodium <1590m, cholesterol <2075m calories 30% from fat and 7% or less from saturated fats, daily to have 5 or more servings of fruits and vegetables.  Biometrics: Pre Biometrics - 09/22/19 1541      Pre Biometrics   Height  5' 6.5" (1.689 m)    Weight  250 lb 12.8 oz (113.8 kg)    BMI (Calculated)  39.88    Single Leg Stand  1.53 seconds        Nutrition Therapy Plan and Nutrition Goals: Nutrition Therapy & Goals - 09/26/19 1503      Nutrition Therapy   Diet  HH diet    Drug/Food Interactions  Statins/Certain Fruits;Coumadin/Vit K    Protein (specify units)  125.18g    Fiber  25 grams    Whole Grain Foods  3 servings    Saturated Fats  12 max. grams    Fruits and Vegetables  5 servings/day  Sodium  1.5 grams      Personal Nutrition Goals   Nutrition Goal  ST: include more vegetables in an easier manner LT: able to get around better, would like to increase balance, MD said to lose weight.    Comments  Pt reports able to do ADL. Toast with peanut butter (wheat, unless its expensive) with 2 cups of coffee (black)and  Juice glass of cranberry juice (mostly water). Pimento cheese cracker or banana and peanut butter, cereal, yogurt.  Meatloaf and baked potato. Pt reports not eating many vegetables. Snacks on fruit. Pt on long lasting insulin; checks BG every morning - takes insulin at night. Discussed HH eating, set-point weight, pulmonary MNT. Lipitor  (statin),Colace,Lasix,T2DM: januvia, Insulin Glargine (long lasting),Coumadin,Vitamin D, CKD stg 4, CHF, Pulmonary hypertension      Intervention Plan   Intervention  Nutrition handout(s) given to patient.    Expected Outcomes  Short Term Goal: Understand basic principles of dietary content, such as calories, fat, sodium, cholesterol and nutrients.;Short Term Goal: A plan has been developed with personal nutrition goals set during dietitian appointment.;Long Term Goal: Adherence to prescribed nutrition plan.       Nutrition Assessments: Nutrition Assessments - 09/22/19 1542      MEDFICTS Scores   Pre Score  56       Nutrition Goals Re-Evaluation: Nutrition Goals Re-Evaluation    [place] Name 10/18/19 1126             Goals   Nutrition Goal  ST: include more vegetables in an easier manner LT: able to get around better, would like to increase balance, MD said to lose weight.       Comment  Pt reports including more fruits and vegetables and is doing well incorporating them into her meals. discussed consistent eating and BG.       Expected Outcome  ST: include more vegetables in an easier manner LT: able to get around better, would like to increase balance, MD said to lose weight.          Nutrition Goals Discharge (Final Nutrition Goals Re-Evaluation): Nutrition Goals Re-Evaluation - 10/18/19 1126      Goals   Nutrition Goal  ST: include more vegetables in an easier manner LT: able to get around better, would like to increase balance, MD said to lose weight.    Comment  Pt reports including more fruits and vegetables and is doing well incorporating them into her meals. discussed consistent eating and BG.    Expected Outcome  ST: include more vegetables in an easier manner LT: able to get around better, would like to increase balance, MD said to lose weight.       Psychosocial: Target Goals: Acknowledge presence or absence of significant depression and/or stress, maximize coping  skills, provide positive support system. Participant is able to verbalize types and ability to use techniques and skills needed for reducing stress and depression.   Initial Review & Psychosocial Screening: Initial Psych Review & Screening - 09/21/19 0817      Initial Review   Current issues with  Current Depression;History of Depression;Current Psychotropic Meds;Current Stress Concerns    Source of Stress Concerns  Chronic Illness;Family;Retirement/disability    Comments  History of depression from loss of husband six years ago and started taking Elavil and she feels like her meds are working.  She recently moved down here about 7 months ago and the pandemic has made it hard to get acquainted.  She has two kids,  daughter in [place] and son in [place].      Family Dynamics   Good Support System?  Yes   Son and daughter are support system and live near by, hard to make new friends   Comments  Lost husband six years ago and just moved here 7 months ago      Barriers   Psychosocial barriers to participate in program  The patient should benefit from training in stress management and relaxation.;Psychosocial barriers identified (see note)      Screening Interventions   Interventions  Encouraged to exercise;Program counselor consult;To provide support and resources with identified psychosocial needs;Provide feedback about the scores to participant    Expected Outcomes  Short Term goal: Utilizing psychosocial counselor, staff and physician to assist with identification of specific Stressors or current issues interfering with healing process. Setting desired goal for each stressor or current issue identified.;Long Term Goal: Stressors or current issues are controlled or eliminated.;Short Term goal: Identification and review with participant of any Quality of Life or Depression concerns found by scoring the questionnaire.;Long Term goal: The participant improves quality of Life and PHQ9 Scores as  seen by post scores and/or verbalization of changes       Quality of Life Scores:  Scores of 19 and below usually indicate a poorer quality of life in these areas.  A difference of  2-3 points is a clinically meaningful difference.  A difference of 2-3 points in the total score of the Quality of Life Index has been associated with significant improvement in overall quality of life, self-image, physical symptoms, and general health in studies assessing change in quality of life.  PHQ-9: Recent Review Flowsheet Data    Depression screen [facility] 2/9 10/11/2019 09/22/2019 03/28/2019   Decreased Interest 1 1 0   Down, Depressed, Hopeless _0 PHQ - 2 Score _1 Altered sleeping 1 2 -   Tired, decreased energy 1 3 -   Change in appetite 1 2 -   Feeling bad or failure about yourself  1 2 -   Trouble concentrating 0 0 -   Moving slowly or fidgety/restless 0 0 -   Suicidal thoughts 0 0 -   PHQ-9 Score 6 11 -   Difficult doing work/chores Somewhat difficult Somewhat difficult -     Interpretation of Total Score  Total Score Depression Severity:  1-4 = Minimal depression, 5-9 = Mild depression, 10-14 = Moderate depression, 15-19 = Moderately severe depression, 20-27 = Severe depression   Psychosocial Evaluation and Intervention: Psychosocial Evaluation - 09/21/19 0831      Psychosocial Evaluation & Interventions   Interventions  Encouraged to exercise with the program and follow exercise prescription    Comments  Ms. Bischoff is coming into Pulmonary Rehab with history of pulmonary hypertension, ILD, and heart failure.  She moved to the area seven months ago and has found it hard to settle in and make friends during the pandemic.  Her kids live close by (daughter in [place] and son in [place], [place]).  She lost her husband six years ago and decided it was time to be closer to her kids. She really wants to get into a routine and start moving more as the pandemic has turned her into a couch  potato.  She is currently needing to be wheeled around at the hospital but we are going to work with her to be able to walk around on her own by building strength  and stamina.  Coming to class will also offer her a social outlet and chance to meet some people in the area.  We look forward to working with her.    Expected Outcomes  Short: Build up strength and stamina for improved mobility.  Long: Make adjustments to living in the area.    Continue Psychosocial Services   Follow up required by staff       Psychosocial Re-Evaluation: Psychosocial Re-Evaluation    [place] Name 10/27/19 1136             Psychosocial Re-Evaluation   Current issues with  Current Stress Concerns;Current Sleep Concerns       Comments  Brigid moved here from [place] 8 months ago.  She hasnt been able to meet many people yet.  She does have family here - daughter and son in law.  She states she sleeps pretty well - around 6 hours a night.       Expected Outcomes  Short: continue to attend [facility] for the exercise Long:  meet more people locally       Interventions  Encouraged to attend Pulmonary Rehabilitation for the exercise          Psychosocial Discharge (Final Psychosocial Re-Evaluation): Psychosocial Re-Evaluation - 10/27/19 1136      Psychosocial Re-Evaluation   Current issues with  Current Stress Concerns;Current Sleep Concerns    Comments  Jakeria moved here from [place] 8 months ago.  She hasnt been able to meet many people yet.  She does have family here - daughter and son in law.  She states she sleeps pretty well - around 6 hours a night.    Expected Outcomes  Short: continue to attend [facility] for the exercise Long:  meet more people locally    Interventions  Encouraged to attend Pulmonary Rehabilitation for the exercise       Education: Education Goals: Education classes will be provided on a weekly basis, covering required topics. Participant will state understanding/return demonstration of topics  presented.  Learning Barriers/Preferences: Learning Barriers/Preferences - 09/21/19 4132      Learning Barriers/Preferences   Learning Barriers  Sight   glasses   Learning Preferences  None       Education Topics:  Initial Evaluation Education: - Verbal, written and demonstration of respiratory meds, oximetry and breathing techniques. Instruction on use of nebulizers and MDIs and importance of monitoring MDI activations.   Pulmonary Rehab from 09/22/2019 in [facility] Cardiac and Pulmonary Rehab  Date  09/22/19  Educator  [facility]  Instruction Review Code  1- Verbalizes Understanding      General Nutrition Guidelines/Fats and Fiber: -Group instruction provided by verbal, written material, models and posters to present the general guidelines for heart healthy nutrition. Gives an explanation and review of dietary fats and fiber.   Controlling Sodium/Reading Food Labels: -Group verbal and written material supporting the discussion of sodium use in heart healthy nutrition. Review and explanation with models, verbal and written materials for utilization of the food label.   Exercise Physiology & General Exercise Guidelines: - Group verbal and written instruction with models to review the exercise physiology of the cardiovascular system and associated critical values. Provides general exercise guidelines with specific guidelines to those with heart or lung disease.    Aerobic Exercise & Resistance Training: - Gives group verbal and written instruction on the various components of exercise. Focuses on aerobic and resistive training programs and the benefits of this training and how to safely progress through  these programs.   Flexibility, Balance, Mind/Body Relaxation: Provides group verbal/written instruction on the benefits of flexibility and balance training, including mind/body exercise modes such as yoga, pilates and tai chi.  Demonstration and skill practice provided.   Stress and  Anxiety: - Provides group verbal and written instruction about the health risks of elevated stress and causes of high stress.  Discuss the correlation between heart/lung disease and anxiety and treatment options. Review healthy ways to manage with stress and anxiety.   Depression: - Provides group verbal and written instruction on the correlation between heart/lung disease and depressed mood, treatment options, and the stigmas associated with seeking treatment.   Exercise & Equipment Safety: - Individual verbal instruction and demonstration of equipment use and safety with use of the equipment.   Pulmonary Rehab from 09/22/2019 in [facility] Cardiac and Pulmonary Rehab  Date  09/22/19  Educator  [facility]  Instruction Review Code  1- Verbalizes Understanding      Infection Prevention: - Provides verbal and written material to individual with discussion of infection control including proper hand washing and proper equipment cleaning during exercise session.   Pulmonary Rehab from 09/22/2019 in [facility] Cardiac and Pulmonary Rehab  Date  09/22/19  Educator  [facility]  Instruction Review Code  1- Verbalizes Understanding      Falls Prevention: - Provides verbal and written material to individual with discussion of falls prevention and safety.   Pulmonary Rehab from 09/22/2019 in [facility] Cardiac and Pulmonary Rehab  Date  09/22/19  Educator  [facility]  Instruction Review Code  1- Verbalizes Understanding      Diabetes: - Individual verbal and written instruction to review signs/symptoms of diabetes, desired ranges of glucose level fasting, after meals and with exercise. Advice that pre and post exercise glucose checks will be done for 3 sessions at entry of program.   Chronic Lung Diseases: - Group verbal and written instruction to review updates, respiratory medications, advancements in procedures and treatments. Discuss use of supplemental oxygen including available portable oxygen systems, continuous and intermittent  flow rates, concentrators, personal use and safety guidelines. Review proper use of inhaler and spacers. Provide informative websites for self-education.    Energy Conservation: - Provide group verbal and written instruction for methods to conserve energy, plan and organize activities. Instruct on pacing techniques, use of adaptive equipment and posture/positioning to relieve shortness of breath.   Triggers and Exacerbations: - Group verbal and written instruction to review types of environmental triggers and ways to prevent exacerbations. Discuss weather changes, air quality and the benefits of nasal washing. Review warning signs and symptoms to help prevent infections. Discuss techniques for effective airway clearance, coughing, and vibrations.   AED/CPR: - Group verbal and written instruction with the use of models to demonstrate the basic use of the AED with the basic ABC's of resuscitation.   Anatomy and Physiology of the Lungs: - Group verbal and written instruction with the use of models to provide basic lung anatomy and physiology related to function, structure and complications of lung disease.   Anatomy & Physiology of the Heart: - Group verbal and written instruction and models provide basic cardiac anatomy and physiology, with the coronary electrical and arterial systems. Review of Valvular disease and Heart Failure   Cardiac Medications: - Group verbal and written instruction to review commonly prescribed medications for heart disease. Reviews the medication, class of the drug, and side effects.   Know Your Numbers and Risk Factors: -Group verbal and written instruction about important  numbers in your health.  Discussion of what are risk factors and how they play a role in the disease process.  Review of Cholesterol, Blood Pressure, Diabetes, and BMI and the role they play in your overall health.   Sleep Hygiene: -Provides group verbal and written instruction about how  sleep can affect your health.  Define sleep hygiene, discuss sleep cycles and impact of sleep habits. Review good sleep hygiene tips.    Other: -Provides group and verbal instruction on various topics (see comments)    Knowledge Questionnaire Score: Knowledge Questionnaire Score - 09/22/19 1542      Knowledge Questionnaire Score   Pre Score  18/18        Core Components/Risk Factors/Patient Goals at Admission: Personal Goals and Risk Factors at Admission - 09/21/19 0856      Core Components/Risk Factors/Patient Goals on Admission    Weight Management  Yes;Weight Loss    Intervention  Weight Management: Develop a combined nutrition and exercise program designed to reach desired caloric intake, while maintaining appropriate intake of nutrient and fiber, sodium and fats, and appropriate energy expenditure required for the weight goal.;Weight Management: Provide education and appropriate resources to help participant work on and attain dietary goals.    Expected Outcomes  Short Term: Continue to assess and modify interventions until short term weight is achieved;Long Term: Adherence to nutrition and physical activity/exercise program aimed toward attainment of established weight goal;Weight Loss: Understanding of general recommendations for a balanced deficit meal plan, which promotes 1-2 lb weight loss per week and includes a negative energy balance of [id number] kcal/d;Understanding recommendations for meals to include 15-35% energy as protein, 25-35% energy from fat, 35-60% energy from carbohydrates, less than 271m of dietary cholesterol, 20-35 gm of total fiber daily;Understanding of distribution of calorie intake throughout the day with the consumption of 4-5 meals/snacks    Diabetes  Yes    Intervention  Provide education about signs/symptoms and action to take for hypo/hyperglycemia.;Provide education about proper nutrition, including hydration, and aerobic/resistive exercise prescription  along with prescribed medications to achieve blood glucose in normal ranges: Fasting glucose 65-99 mg/dL    Expected Outcomes  Short Term: Participant verbalizes understanding of the signs/symptoms and immediate care of hyper/hypoglycemia, proper foot care and importance of medication, aerobic/resistive exercise and nutrition plan for blood glucose control.;Long Term: Attainment of HbA1C < 7%.    Heart Failure  Yes    Intervention  Provide a combined exercise and nutrition program that is supplemented with education, support and counseling about heart failure. Directed toward relieving symptoms such as shortness of breath, decreased exercise tolerance, and extremity edema.    Expected Outcomes  Improve functional capacity of life;Short term: Attendance in program 2-3 days a week with increased exercise capacity. Reported lower sodium intake. Reported increased fruit and vegetable intake. Reports medication compliance.;Short term: Daily weights obtained and reported for increase. Utilizing diuretic protocols set by physician.;Long term: Adoption of self-care skills and reduction of barriers for early signs and symptoms recognition and intervention leading to self-care maintenance.    Hypertension  Yes    Intervention  Provide education on lifestyle modifcations including regular physical activity/exercise, weight management, moderate sodium restriction and increased consumption of fresh fruit, vegetables, and low fat dairy, alcohol moderation, and smoking cessation.;Monitor prescription use compliance.    Expected Outcomes  Short Term: Continued assessment and intervention until BP is < 140/97mHG in hypertensive participants. < 130/8034mG in hypertensive participants with diabetes, heart failure or chronic kidney disease.;Long  Term: Maintenance of blood pressure at goal levels.    Lipids  Yes    Intervention  Provide education and support for participant on nutrition & aerobic/resistive exercise along  with prescribed medications to achieve LDL <24m, HDL >429m    Expected Outcomes  Short Term: Participant states understanding of desired cholesterol values and is compliant with medications prescribed. Participant is following exercise prescription and nutrition guidelines.;Long Term: Cholesterol controlled with medications as prescribed, with individualized exercise RX and with personalized nutrition plan. Value goals: LDL < 7029mHDL > 40 mg.       Core Components/Risk Factors/Patient Goals Review:  Goals and Risk Factor Review    Row Name 10/27/19 1134             Core Components/Risk Factors/Patient Goals Review   Personal Goals Review  Weight Management/Obesity;Improve shortness of breath with ADL's;Hypertension;Lipids;Heart Failure       Review  BetKhamani taking all meds as directed.  Her weight was up today and she will call her Dr to follow up about fluid weight.  She feels better since she started exercising.  She can do some ADLs easier.       Expected Outcomes  Short:  follow up with Dr LonLaverta Baltimoreanage risk factors          Core Components/Risk Factors/Patient Goals at Discharge (Final Review):  Goals and Risk Factor Review - 10/27/19 1134      Core Components/Risk Factors/Patient Goals Review   Personal Goals Review  Weight Management/Obesity;Improve shortness of breath with ADL's;Hypertension;Lipids;Heart Failure    Review  BetTavon taking all meds as directed.  Her weight was up today and she will call her Dr to follow up about fluid weight.  She feels better since she started exercising.  She can do some ADLs easier.    Expected Outcomes  Short:  follow up with Dr LonLaverta Baltimoreanage risk factors       ITP Comments: ITP Comments    Row Name 09/21/19 0857 09/22/19 1534 09/26/19 1536 09/27/19 1120 10/12/19 0625   ITP Comments  Completed virtual orientation today.  EP eval scheduled for 2/4 at 930. Documentation for diagnosis can be found in [facility] 09/07/19.  Completed 6MWT  and gym orientation.  Initial ITP created and sent for review to Dr. MarEmily Filbertedical Director.  Completed Initial RD Eval  First full day of exercise!  Patient was oriented to gym and equipment including functions, settings, policies, and procedures.  Patient's individual exercise prescription and treatment plan were reviewed.  All starting workloads were established based on the results of the 6 minute walk test done at initial orientation visit.  The plan for exercise progression was also introduced and progression will be customized based on patient's performance and goals.  30 day chart review completed. ITP sent to Dr M MZachery Dakinsdical Director, for review,changes as needed and signature. New to program   Row Name 10/25/19 1240 11/08/19 1138 11/09/19 0606       ITP Comments  Pulse oximeter not picking up HR. Placed on monitor to determine HR and rhythm. Found to be in A Fib. Note sent to referring physician. Patient is not aware of ever being in A fib. She is on Warfarin daily.  Dr. GolRockey Situturned fax of afib stating to continue with exercise program  30 day chart review completed. ITP sent to Dr M MZachery Dakinsdical Director, for review,changes as needed and signature. Continue with ITP if no changes requested  Comments:

## 2019-11-09 NOTE — Telephone Encounter (Signed)
Ann Flynn has refused these scripts two days ago. Did not want to refill if she intended not to do so.

## 2019-11-10 ENCOUNTER — Encounter: Payer: Medicare Other | Admitting: *Deleted

## 2019-11-10 ENCOUNTER — Other Ambulatory Visit: Payer: Self-pay

## 2019-11-10 DIAGNOSIS — I270 Primary pulmonary hypertension: Secondary | ICD-10-CM | POA: Diagnosis not present

## 2019-11-10 DIAGNOSIS — I272 Pulmonary hypertension, unspecified: Secondary | ICD-10-CM

## 2019-11-10 NOTE — Telephone Encounter (Signed)
Left message for patient to call back to verify. KW

## 2019-11-10 NOTE — Telephone Encounter (Signed)
I don't think she should stop these medications abruptly.  I'd go ahead and refill.  Make sure the doses are correct and there isn't something else on the problem list

## 2019-11-10 NOTE — Progress Notes (Signed)
Daily Session Note  Patient Details  Name: Ann Flynn MRN: [id number] Date of Birth: 10/29/1940 Referring Provider:     Pulmonary Rehab from 09/22/2019 in [facility] Cardiac and Pulmonary Rehab  Referring Provider  Bensimhon, Daniel MD      Encounter Date: 11/10/2019  Check In: Session Check In - 11/10/19 1121      Check-In   Supervising physician immediately available to respond to emergencies  See telemetry face sheet for immediately available ER MD    Location  [facility]-Cardiac & Pulmonary Rehab    Staff Present   , RN BSN;Melissa Caiola RDN, LDN;Jessica Hawkins, MA, RCEP, CCRP, CCET    Virtual Visit  No    Medication changes reported      No    Fall or balance concerns reported     No    Warm-up and Cool-down  Performed on first and last piece of equipment    Resistance Training Performed  Yes    VAD Patient?  No    PAD/SET Patient?  No      Pain Assessment   Currently in Pain?  No/denies          Social History   Tobacco Use  Smoking Status Never Smoker  Smokeless Tobacco Never Used    Goals Met:  Independence with exercise equipment Exercise tolerated well No report of cardiac concerns or symptoms Strength training completed today  Goals Unmet:  Not Applicable  Comments: Pt able to follow exercise prescription today without complaint.  Will continue to monitor for progression.    Dr. Mark Miller is Medical Director for [facility] and [facility]. 

## 2019-11-11 NOTE — Telephone Encounter (Signed)
Spoke with pharmacist at [facility], Susy Frizzle D., patient has requested refill. She has no other betablocker's or anticoagulants on file at pharmacy or in [place]. Patient has been unable to be reached. Note for patient to call office on script. Was declined by PCP 3 days ago due to too early, pharmacist verified with provider patient had no further refills avaible after the last one she picked up.

## 2019-11-15 ENCOUNTER — Encounter: Payer: Medicare Other | Admitting: *Deleted

## 2019-11-15 ENCOUNTER — Other Ambulatory Visit: Payer: Self-pay

## 2019-11-15 DIAGNOSIS — I272 Pulmonary hypertension, unspecified: Secondary | ICD-10-CM

## 2019-11-15 DIAGNOSIS — I270 Primary pulmonary hypertension: Secondary | ICD-10-CM | POA: Diagnosis not present

## 2019-11-15 NOTE — Progress Notes (Signed)
Daily Session Note  Patient Details  Name: Ann Flynn MRN: [id number] Date of Birth: 07-06-1941 Referring Provider:     Pulmonary Rehab from 09/22/2019 in [facility] Cardiac and Pulmonary Rehab  Referring Provider  Glori Bickers MD      Encounter Date: 11/15/2019  Check In: Session Check In - 11/15/19 1143      Check-In   Supervising physician immediately available to respond to emergencies  See telemetry face sheet for immediately available ER MD    Location  [facility]-Cardiac & Pulmonary Rehab    Staff Present  Heath Lark, RN, BSN, CCRP;Joseph Hood RCP,RRT,BSRT;Jessica [place], [place], [place], [place], CCET    Virtual Visit  No    Medication changes reported      No    Fall or balance concerns reported     No    Warm-up and Cool-down  Performed on first and last piece of equipment    Resistance Training Performed  Yes    VAD Patient?  No    PAD/SET Patient?  No      Pain Assessment   Currently in Pain?  No/denies        Exercise Prescription Changes - 11/15/19 1100      Home Exercise Plan   Plans to continue exercise at  Home (comment)   walking, staff videos   Frequency  Add 2 additional days to program exercise sessions.    Initial Home Exercises Provided  11/15/19       Social History   Tobacco Use  Smoking Status Never Smoker  Smokeless Tobacco Never Used    Goals Met:  Proper associated with RPD/PD & O2 Sat Independence with exercise equipment Exercise tolerated well No report of cardiac concerns or symptoms  Goals Unmet:  Not Applicable  Comments: Pt able to follow exercise prescription today without complaint.  Will continue to monitor for progression.    Dr. Emily Filbert is Medical Director for [place] and [facility].

## 2019-11-16 NOTE — Telephone Encounter (Signed)
Spoke with patient and reviewed provider recommendations for appointment with new episode of atrial fibrillation. She verbalized understanding and wanted to wait until she had her appointment with Dr. [name] first before scheduling with our office. Will send her our contact information via My Chart so that if after seeing him she is ready to schedule she can give [place] a call. She was appreciative for the call with no further questions at this time.

## 2019-11-17 ENCOUNTER — Encounter: Payer: Medicare Other | Attending: Internal Medicine | Admitting: *Deleted

## 2019-11-17 ENCOUNTER — Other Ambulatory Visit: Payer: Self-pay

## 2019-11-17 DIAGNOSIS — E118 Type 2 diabetes mellitus with unspecified complications: Secondary | ICD-10-CM | POA: Diagnosis not present

## 2019-11-17 DIAGNOSIS — I270 Primary pulmonary hypertension: Secondary | ICD-10-CM | POA: Insufficient documentation

## 2019-11-17 DIAGNOSIS — Z7901 Long term (current) use of anticoagulants: Secondary | ICD-10-CM | POA: Insufficient documentation

## 2019-11-17 DIAGNOSIS — Z79899 Other long term (current) drug therapy: Secondary | ICD-10-CM | POA: Insufficient documentation

## 2019-11-17 DIAGNOSIS — Z794 Long term (current) use of insulin: Secondary | ICD-10-CM | POA: Diagnosis not present

## 2019-11-17 DIAGNOSIS — I272 Pulmonary hypertension, unspecified: Secondary | ICD-10-CM

## 2019-11-17 NOTE — Progress Notes (Signed)
Daily Session Note  Patient Details  Name: Ann Flynn MRN: [id number] Date of Birth: [birth year] Referring Provider:     Pulmonary Rehab from 09/22/2019 in [facility] Cardiac and Pulmonary Rehab  Referring Provider  Glori Bickers MD      Encounter Date: 11/17/2019  Check In: Session Check In - 11/17/19 1153      Check-In   Supervising physician immediately available to respond to emergencies  See telemetry face sheet for immediately available ER MD    Location  [facility]-Cardiac & Pulmonary Rehab    Staff Present  Nyoka Cowden, RN, BSN, [place], BS, RRT, CPFT;Joseph Flavia Shipper    Virtual Visit  No    Medication changes reported      No    Fall or balance concerns reported     No    Warm-up and Cool-down  Performed on first and last piece of equipment    Resistance Training Performed  Yes    VAD Patient?  No    PAD/SET Patient?  No      Pain Assessment   Currently in Pain?  No/denies          Social History   Tobacco Use  Smoking Status Never Smoker  Smokeless Tobacco Never Used    Goals Met:  Independence with exercise equipment Exercise tolerated well No report of cardiac concerns or symptoms  Goals Unmet:  Not Applicable  Comments: Pt able to follow exercise prescription today without complaint.  Will continue to monitor for progression.   Dr. Emily Filbert is Medical Director for [place] and [facility].

## 2019-11-21 ENCOUNTER — Telehealth (HOSPITAL_COMMUNITY): Payer: Self-pay | Admitting: *Deleted

## 2019-11-21 NOTE — Telephone Encounter (Signed)
Lauren with [employer] called to report pt had a 3lb weight gain overnight. Pts weight has been trending up. Pts weight is up 8lbs in about 2 weeks.  Pt c/o increased shortness of breath with exertion. No swelling, no chest pain, or fatigue. Patient is taking all medications as prescribed.   Routed to [place] for advice

## 2019-11-21 NOTE — Telephone Encounter (Signed)
   Please call. Increase lasix to 80 mg daily x2 days then take lasix 40 mg daily.   If no improvement call back for earlier appointment.   Osby Sweetin NP-C  11:48 AM

## 2019-11-22 ENCOUNTER — Ambulatory Visit (INDEPENDENT_AMBULATORY_CARE_PROVIDER_SITE_OTHER): Payer: Medicare Other

## 2019-11-22 ENCOUNTER — Other Ambulatory Visit: Payer: Self-pay

## 2019-11-22 ENCOUNTER — Encounter: Payer: Medicare Other | Admitting: *Deleted

## 2019-11-22 ENCOUNTER — Other Ambulatory Visit: Payer: Self-pay | Admitting: Physician Assistant

## 2019-11-22 DIAGNOSIS — Z8673 Personal history of transient ischemic attack (TIA), and cerebral infarction without residual deficits: Secondary | ICD-10-CM

## 2019-11-22 DIAGNOSIS — I270 Primary pulmonary hypertension: Secondary | ICD-10-CM | POA: Diagnosis not present

## 2019-11-22 DIAGNOSIS — I272 Pulmonary hypertension, unspecified: Secondary | ICD-10-CM

## 2019-11-22 LAB — POCT INR
INR: 3.3 — AB (ref 2.0–3.0)
PT: 40

## 2019-11-22 NOTE — Patient Instructions (Signed)
Description   85m on T,TH,Sat except 4 mg the other days. Recheck in 4 weeks

## 2019-11-22 NOTE — Progress Notes (Signed)
Daily Session Note  Patient Details  Name: Lucella Pommier MRN: [id number] Date of Birth: 02-23-1941 Referring Provider:     Pulmonary Rehab from 09/22/2019 in [facility] Cardiac and Pulmonary Rehab  Referring Provider  Glori Bickers MD      Encounter Date: 11/22/2019  Check In: Session Check In - 11/22/19 1145      Check-In   Supervising physician immediately available to respond to emergencies  See telemetry face sheet for immediately available ER MD    Location  [facility]-Cardiac & Pulmonary Rehab    Staff Present  Nada Maclachlan, BA, ACSM CEP, Exercise Physiologist;Joseph Kouts [employer] [place];Heath Lark, RN, BSN, CCRP    Virtual Visit  No    Medication changes reported      No    Fall or balance concerns reported     No    Warm-up and Cool-down  Performed on first and last piece of equipment    Resistance Training Performed  Yes    VAD Patient?  No    PAD/SET Patient?  No      Pain Assessment   Currently in Pain?  No/denies          Social History   Tobacco Use  Smoking Status Never Smoker  Smokeless Tobacco Never Used    Goals Met:  Proper associated with RPD/PD & O2 Sat Independence with exercise equipment Exercise tolerated well No report of cardiac concerns or symptoms  Goals Unmet:  Not Applicable  Comments: Pt able to follow exercise prescription today without complaint.  Will continue to monitor for progression.    Dr. Emily Filbert is Medical Director for [place] and [facility].

## 2019-11-24 ENCOUNTER — Other Ambulatory Visit: Payer: Self-pay

## 2019-11-24 DIAGNOSIS — I272 Pulmonary hypertension, unspecified: Secondary | ICD-10-CM

## 2019-11-24 DIAGNOSIS — I270 Primary pulmonary hypertension: Secondary | ICD-10-CM | POA: Diagnosis not present

## 2019-11-24 NOTE — Telephone Encounter (Signed)
Pt aware and agreeable with plan.

## 2019-11-24 NOTE — Progress Notes (Signed)
Daily Session Note  Patient Details  Name: Ann Flynn MRN: [id number] Date of Birth: March 12, 1941 Referring Provider:     Pulmonary Rehab from 09/22/2019 in [facility] Cardiac and Pulmonary Rehab  Referring Provider  Glori Bickers MD      Encounter Date: 11/24/2019  Check In: Session Check In - 11/24/19 1134      Check-In   Supervising physician immediately available to respond to emergencies  See telemetry face sheet for immediately available ER MD    Location  [facility]-Cardiac & Pulmonary Rehab    Staff Present  Vida Rigger RN, Vickki Hearing, BA, ACSM CEP, Exercise Physiologist;Melissa Caiola RDN, LDN;Joseph Hood RCP,RRT,BSRT    Virtual Visit  No    Medication changes reported      No    Fall or balance concerns reported     No    Warm-up and Cool-down  Performed on first and last piece of equipment    Resistance Training Performed  Yes    VAD Patient?  No    PAD/SET Patient?  No      Pain Assessment   Currently in Pain?  No/denies          Social History   Tobacco Use  Smoking Status Never Smoker  Smokeless Tobacco Never Used    Goals Met:  Proper associated with RPD/PD & O2 Sat Independence with exercise equipment Using PLB without cueing & demonstrates good technique Exercise tolerated well No report of cardiac concerns or symptoms Strength training completed today  Goals Unmet:  Not Applicable  Comments: Pt able to follow exercise prescription today without complaint.  Will continue to monitor for progression.   Dr. Emily Filbert is Medical Director for [place] and [facility].

## 2019-11-29 ENCOUNTER — Encounter: Payer: Medicare Other | Admitting: *Deleted

## 2019-11-29 ENCOUNTER — Other Ambulatory Visit: Payer: Self-pay

## 2019-11-29 DIAGNOSIS — I270 Primary pulmonary hypertension: Secondary | ICD-10-CM | POA: Diagnosis not present

## 2019-11-29 DIAGNOSIS — I272 Pulmonary hypertension, unspecified: Secondary | ICD-10-CM

## 2019-11-29 NOTE — Progress Notes (Signed)
Daily Session Note  Patient Details  Name: Ann Flynn MRN: [id number] Date of Birth: 07/31/1941 Referring Provider:     Pulmonary Rehab from 09/22/2019 in [facility] Cardiac and Pulmonary Rehab  Referring Provider  Glori Bickers MD      Encounter Date: 11/29/2019  Check In: Session Check In - 11/29/19 1129      Check-In   Supervising physician immediately available to respond to emergencies  See telemetry face sheet for immediately available ER MD    Location  [facility] Pulmonary Rehab    Staff Present  Nada Maclachlan, BA, ACSM CEP, Exercise Physiologist;Leea Rambeau, RN, BSN, CCRP;Joseph Hood RCP,RRT,BSRT    Virtual Visit  No    Medication changes reported      No    Fall or balance concerns reported     No    Warm-up and Cool-down  Performed on first and last piece of equipment    Resistance Training Performed  Yes    VAD Patient?  No    PAD/SET Patient?  No      Pain Assessment   Currently in Pain?  No/denies          Social History   Tobacco Use  Smoking Status Never Smoker  Smokeless Tobacco Never Used    Goals Met:  Proper associated with RPD/PD & O2 Sat Independence with exercise equipment Exercise tolerated well No report of cardiac concerns or symptoms  Goals Unmet:  Not Applicable  Comments: Pt able to follow exercise prescription today without complaint.  Will continue to monitor for progression.    Dr. Emily Filbert is Medical Director for [place] and [facility].

## 2019-11-30 ENCOUNTER — Other Ambulatory Visit: Payer: Self-pay | Admitting: Physician Assistant

## 2019-11-30 NOTE — Telephone Encounter (Signed)
Requested Prescriptions  Pending Prescriptions Disp Refills  . BD PEN NEEDLE NANO U/F 32G X 4 MM MISC [Pharmacy Med Name: BD PEN NEEDLE NANO U/F 32G X 4 MM] 100 each 2    Sig: USE TO INJECT ONCE DAILY AS DIRECTED     Endocrinology: Diabetes - Testing Supplies Passed - 11/30/2019  3:22 PM      Passed - Valid encounter within last 12 months    Recent Outpatient Visits          1 month ago Chronic pulmonary embolism, unspecified pulmonary embolism type, unspecified whether acute cor pulmonale present [facility])   [facility] [place], Fabio Bering M, PA-C   3 months ago History of stroke   [place], [place], PA-C   4 months ago Diabetes mellitus without complication [facility])   [place], [place], PA-C   8 months ago Congestive heart failure, unspecified HF chronicity, unspecified heart failure type [facility])   [facility] [place], PA-C      Future Appointments            In 1 month Terrilee Croak, Wendee Beavers, PA-C [employer], [facility]

## 2019-12-01 ENCOUNTER — Telehealth: Payer: Self-pay | Admitting: Cardiovascular Disease

## 2019-12-01 ENCOUNTER — Encounter: Payer: Medicare Other | Admitting: *Deleted

## 2019-12-01 ENCOUNTER — Other Ambulatory Visit: Payer: Self-pay

## 2019-12-01 DIAGNOSIS — I270 Primary pulmonary hypertension: Secondary | ICD-10-CM | POA: Diagnosis not present

## 2019-12-01 DIAGNOSIS — I272 Pulmonary hypertension, unspecified: Secondary | ICD-10-CM

## 2019-12-01 NOTE — Progress Notes (Signed)
Daily Session Note  Patient Details  Name: Ann Flynn MRN: [id number] Date of Birth: December 13, 1940 Referring Provider:     Pulmonary Rehab from 09/22/2019 in [facility] Cardiac and Pulmonary Rehab  Referring Provider  Glori Bickers MD      Encounter Date: 12/01/2019  Check In: Session Check In - 12/01/19 1123      Check-In   Supervising physician immediately available to respond to emergencies  See telemetry face sheet for immediately available ER MD    Location  [facility]-Cardiac & Pulmonary Rehab    Staff Present  Renita Papa, RN BSN;Joseph Foy [place], [place], ACSM CEP, Exercise Physiologist    Virtual Visit  No    Medication changes reported      No    Warm-up and Cool-down  Performed on first and last piece of equipment    Resistance Training Performed  Yes    VAD Patient?  No    PAD/SET Patient?  No      Pain Assessment   Currently in Pain?  No/denies          Social History   Tobacco Use  Smoking Status Never Smoker  Smokeless Tobacco Never Used    Goals Met:  Independence with exercise equipment Exercise tolerated well No report of cardiac concerns or symptoms Strength training completed today  Goals Unmet:  Not Applicable  Comments: Pt able to follow exercise prescription today without complaint.  Will continue to monitor for progression.    Dr. Emily Filbert is Medical Director for [place] and [facility].

## 2019-12-01 NOTE — Telephone Encounter (Signed)
Patient changed providers deleting recall

## 2019-12-06 ENCOUNTER — Encounter: Payer: Medicare Other | Admitting: *Deleted

## 2019-12-06 ENCOUNTER — Other Ambulatory Visit: Payer: Self-pay

## 2019-12-06 DIAGNOSIS — I272 Pulmonary hypertension, unspecified: Secondary | ICD-10-CM

## 2019-12-06 DIAGNOSIS — I270 Primary pulmonary hypertension: Secondary | ICD-10-CM | POA: Diagnosis not present

## 2019-12-06 NOTE — Progress Notes (Signed)
Daily Session Note  Patient Details  Name: Ann Flynn MRN: [id number] Date of Birth: [birth year] Referring Provider:     Pulmonary Rehab from 09/22/2019 in [facility] Cardiac and Pulmonary Rehab  Referring Provider  Glori Bickers MD      Encounter Date: 12/06/2019  Check In: Session Check In - 12/06/19 1125      Check-In   Supervising physician immediately available to respond to emergencies  See telemetry face sheet for immediately available ER MD    Location  [facility]-Cardiac & Pulmonary Rehab    Staff Present  Heath Lark, RN, BSN, CCRP;Amanda Sommer, BA, ACSM CEP, Exercise Physiologist;Joseph Hood RCP,RRT,BSRT    Virtual Visit  No    Medication changes reported      No    Fall or balance concerns reported     No    Warm-up and Cool-down  Performed on first and last piece of equipment    Resistance Training Performed  Yes    VAD Patient?  No    PAD/SET Patient?  No      Pain Assessment   Currently in Pain?  No/denies          Social History   Tobacco Use  Smoking Status Never Smoker  Smokeless Tobacco Never Used    Goals Met:  Proper associated with RPD/PD & O2 Sat Independence with exercise equipment Exercise tolerated well No report of cardiac concerns or symptoms  Goals Unmet:  Not Applicable  Comments: Pt able to follow exercise prescription today without complaint.  Will continue to monitor for progression.    Dr. Emily Filbert is Medical Director for [place] and [facility].

## 2019-12-07 ENCOUNTER — Encounter: Payer: Self-pay | Admitting: *Deleted

## 2019-12-07 DIAGNOSIS — I272 Pulmonary hypertension, unspecified: Secondary | ICD-10-CM

## 2019-12-07 NOTE — Progress Notes (Signed)
Pulmonary Individual Treatment Plan  Patient Details  Name: Ann Flynn MRN: [id number] Date of Birth: [birth year] Referring Provider:     Pulmonary Rehab from 09/22/2019 in [facility] Cardiac and Pulmonary Rehab  Referring Provider  Glori Bickers MD      Initial Encounter Date:    Pulmonary Rehab from 09/22/2019 in [facility] Cardiac and Pulmonary Rehab  Date  09/22/19      Visit Diagnosis: Pulmonary hypertension ([place])  Patient's Home Medications on Admission:  Current Outpatient Medications:  .  furosemide (LASIX) 40 MG tablet, TAKE 1 TABLET BY MOUTH DAILY, Disp: 90 tablet, Rfl: 1 .  acetaminophen (TYLENOL) 500 MG tablet, Take 500 mg by mouth 2 (two) times daily as needed for moderate pain., Disp: , Rfl:  .  ambrisentan (LETAIRIS) 5 MG tablet, Take 1 tablet (5 mg total) by mouth daily., Disp: 90 tablet, Rfl: 1 .  amLODipine (NORVASC) 5 MG tablet, Take 1 tablet (5 mg total) by mouth daily. (Patient taking differently: Take 5 mg by mouth every evening. ), Disp: 30 tablet, Rfl: 6 .  atorvastatin (LIPITOR) 80 MG tablet, TAKE 1 TABLET BY MOUTH DAILY, Disp: 90 tablet, Rfl: 0 .  BD PEN NEEDLE NANO U/F 32G X 4 MM MISC, USE TO INJECT ONCE DAILY AS DIRECTED, Disp: 100 each, Rfl: 2 .  Cyanocobalamin (B-12 PO), Take 1 tablet by mouth daily., Disp: , Rfl:  .  docusate sodium (COLACE) 100 MG capsule, Take 100 mg by mouth at bedtime., Disp: , Rfl:  .  Dulaglutide (TRULICITY) 3.97 QB/3.4LP SOPN, Inject 0.75 mg into the skin once a week. (Patient taking differently: Inject 0.75 mg into the skin every Monday. ), Disp: 4 pen, Rfl: 0 .  JANUVIA 25 MG tablet, TAKE ONE TABLET BY MOUTH EVERY DAY, Disp: 90 tablet, Rfl: 0 .  losartan (COZAAR) 100 MG tablet, TAKE ONE TABLET EVERY DAY, Disp: 90 tablet, Rfl: 0 .  metoprolol tartrate (LOPRESSOR) 25 MG tablet, TAKE 1 TABLET BY MOUTH TWICE DAILY, Disp: 30 tablet, Rfl: 0 .  omeprazole (PRILOSEC) 40 MG capsule, TAKE 1 CAPSULE BY MOUTH ONCE DAILY, Disp: 90 capsule, Rfl: 0 .   tadalafil, PAH, (ADCIRCA) 20 MG tablet, Take 2 tablets (40 mg total) by mouth daily., Disp: 180 tablet, Rfl: 1 .  TOUJEO SOLOSTAR 300 UNIT/ML SOPN, INJECT 50 UNITS INTO THE SKIN DAILY, Disp: 4.5 mL, Rfl: 2 .  venlafaxine (EFFEXOR) 75 MG tablet, TAKE ONE TABLET EVERY DAY, Disp: 90 tablet, Rfl: 0 .  VITAMIN D PO, Take 1 capsule by mouth daily., Disp: , Rfl:  .  warfarin (COUMADIN) 3 MG tablet, TAKE 1 TABLET BY MOUTH ON MONDAY WEDNESDAY AND FRIDAY ALONG WITH 4MG ON THE REMAINING DAYS, Disp: 30 tablet, Rfl: 0  Past Medical History: No past medical history on file.  Tobacco Use: Social History   Tobacco Use  Smoking Status Never Smoker  Smokeless Tobacco Never Used    Labs: Recent Review Flowsheet Data    Labs for ITP Cardiac and Pulmonary Rehab Latest Ref Rng & Units 03/28/2019 07/06/2019 07/29/2019 07/29/2019 07/29/2019   Cholestrol 100 - 199 mg/dL 186 - - - -   LDLCALC 0 - 99 mg/dL 95 - - - -   HDL >39 mg/dL 43 - - - -   Trlycerides 0 - 149 mg/dL 240(H) - - - -   Hemoglobin A1c 4.0 - 5.6 % 7.2(H) 7.3(A) - - -   HCO3 20.0 - 28.0 mmol/L - - 26.3 26.7 26.9   TCO2 22 -  32 mmol/L - - _0 O2SAT % - - 75.0 74.0 72.0       Pulmonary Assessment Scores: Pulmonary Assessment Scores    Row Name 09/22/19 1543         ADL UCSD   ADL Phase  Entry     SOB Score total  42     Rest  0     Walk  2     Stairs  5     Bath  1     Dress  1     Shop  2       CAT Score   CAT Score  22       mMRC Score   mMRC Score  2        UCSD: Self-administered rating of dyspnea associated with activities of daily living (ADLs) 6-point scale (0 = "not at all" to 5 = "maximal or unable to do because of breathlessness")  Scoring Scores range from 0 to 120.  Minimally important difference is 5 units  CAT: CAT can identify the health impairment of COPD patients and is better correlated with disease progression.  CAT has a scoring range of zero to 40. The CAT score is classified into four  groups of low (less than 10), medium (10 - 20), high (21-30) and very high (31-40) based on the impact level of disease on health status. A CAT score over 10 suggests significant symptoms.  A worsening CAT score could be explained by an exacerbation, poor medication adherence, poor inhaler technique, or progression of COPD or comorbid conditions.  CAT MCID is 2 points  mMRC: mMRC (Modified Medical Research Council) Dyspnea Scale is used to assess the degree of baseline functional disability in patients of respiratory disease due to dyspnea. No minimal important difference is established. A decrease in score of 1 point or greater is considered a positive change.   Pulmonary Function Assessment: Pulmonary Function Assessment - 09/22/19 1543      Breath   Shortness of Breath  Yes;Fear of Shortness of Breath;Limiting activity       Exercise Target Goals: Exercise Program Goal: Individual exercise prescription set using results from initial 6 min walk test and THRR while considering  patient's activity barriers and safety.   Exercise Prescription Goal: Initial exercise prescription builds to 30-45 minutes a day of aerobic activity, 2-3 days per week.  Home exercise guidelines will be given to patient during program as part of exercise prescription that the participant will acknowledge.  Education: Aerobic Exercise & Resistance Training: - Gives group verbal and written instruction on the various components of exercise. Focuses on aerobic and resistive training programs and the benefits of this training and how to safely progress through these programs..   Education: Exercise & Equipment Safety: - Individual verbal instruction and demonstration of equipment use and safety with use of the equipment.   Pulmonary Rehab from 09/22/2019 in [facility] Cardiac and Pulmonary Rehab  Date  09/22/19  Educator  [facility]  Instruction Review Code  1- Verbalizes Understanding      Education: Exercise Physiology &  General Exercise Guidelines: - Group verbal and written instruction with models to review the exercise physiology of the cardiovascular system and associated critical values. Provides general exercise guidelines with specific guidelines to those with heart or lung disease.    Education: Flexibility, Balance, Mind/Body Relaxation: Provides group verbal/written instruction on the benefits of flexibility and balance training, including mind/body exercise modes such as  yoga, pilates and tai chi.  Demonstration and skill practice provided.   Activity Barriers & Risk Stratification: Activity Barriers & Cardiac Risk Stratification - 09/21/19 0808      Activity Barriers & Cardiac Risk Stratification   Activity Barriers  Arthritis;Joint Problems;Other (comment);Deconditioning;Muscular Weakness;Shortness of Breath;Balance Concerns    Comments  Right leg/hip flaring up occassionally (possible sciattica)       6 Minute Walk: 6 Minute Walk    Row Name 09/22/19 1534         6 Minute Walk   Phase  Initial     Distance  400 feet     Walk Time  5 minutes     # of Rest Breaks  0 stopped at 5 min     MPH  0.91     METS  0.56     RPE  17     Perceived Dyspnea   3     VO2 Peak  1.95     Symptoms  Yes (comment)     Comments  dizzy, hips hurting 8/10     Resting HR  62 bpm     Resting BP  134/72     Resting Oxygen Saturation   99 %     Exercise Oxygen Saturation  during 6 min walk  86 %     Max Ex. HR  120 bpm     Max Ex. BP  152/84     2 Minute Post BP  134/74       Interval HR   1 Minute HR  95     2 Minute HR  113     3 Minute HR  117     4 Minute HR  119     5 Minute HR  120     6 Minute HR  111     2 Minute Post HR  87     Interval Heart Rate?  Yes       Interval Oxygen   Interval Oxygen?  Yes     Baseline Oxygen Saturation %  99 %     1 Minute Oxygen Saturation %  95 %     1 Minute Liters of Oxygen  3 L     2 Minute Oxygen Saturation %  94 %     2 Minute Liters of Oxygen  3  L     3 Minute Oxygen Saturation %  91 %     3 Minute Liters of Oxygen  3 L     4 Minute Oxygen Saturation %  85 %     4 Minute Liters of Oxygen  3 L     5 Minute Oxygen Saturation %  86 %     5 Minute Liters of Oxygen  3 L     6 Minute Oxygen Saturation %  90 %     6 Minute Liters of Oxygen  3 L     2 Minute Post Oxygen Saturation %  98 %     2 Minute Post Liters of Oxygen  3 L       Oxygen Initial Assessment: Oxygen Initial Assessment - 09/21/19 0811      Home Oxygen   Home Oxygen Device  Home Concentrator;E-Tanks;Portable Concentrator   just approved for portable concentrator   Sleep Oxygen Prescription  CPAP;Continuous    Liters per minute  2   11 setting   Home Exercise Oxygen Prescription  Continuous   new concentrator  will be pulsed   Liters per minute  3    Home at Rest Exercise Oxygen Prescription  Continuous    Liters per minute  2.5    Compliance with Home Oxygen Use  Yes      Initial 6 min Walk   Oxygen Used  Continuous;E-Tanks   may want to test new concentrator once it arrives   Liters per minute  3      Program Oxygen Prescription   Program Oxygen Prescription  Continuous;E-Tanks    Liters per minute  3      Intervention   Short Term Goals  To learn and exhibit compliance with exercise, home and travel O2 prescription;To learn and understand importance of maintaining oxygen saturations>88%;To learn and understand importance of monitoring SPO2 with pulse oximeter and demonstrate accurate use of the pulse oximeter.;To learn and demonstrate proper pursed lip breathing techniques or other breathing techniques.;To learn and demonstrate proper use of respiratory medications   has pulse oximeter   Long  Term Goals  Exhibits compliance with exercise, home and travel O2 prescription;Verbalizes importance of monitoring SPO2 with pulse oximeter and return demonstration;Maintenance of O2 saturations>88%;Exhibits proper breathing techniques, such as pursed lip breathing  or other method taught during program session;Compliance with respiratory medication;Demonstrates proper use of MDI's       Oxygen Re-Evaluation: Oxygen Re-Evaluation    Row Name 09/27/19 1123 11/15/19 1122           Program Oxygen Prescription   Program Oxygen Prescription  Continuous;E-Tanks  Continuous;E-Tanks      Liters per minute  3  3        Home Oxygen   Home Oxygen Device  Home Concentrator;E-Tanks;Portable Concentrator  Home Concentrator;E-Tanks;Portable Concentrator      Sleep Oxygen Prescription  CPAP;Continuous  CPAP;Continuous      Liters per minute  2  2      Home Exercise Oxygen Prescription  --  Continuous      Liters per minute  3  3      Home at Rest Exercise Oxygen Prescription  Continuous  Continuous      Liters per minute  2.5  2.5      Compliance with Home Oxygen Use  Yes  Yes        Goals/Expected Outcomes   Short Term Goals  To learn and exhibit compliance with exercise, home and travel O2 prescription;To learn and understand importance of maintaining oxygen saturations>88%;To learn and understand importance of monitoring SPO2 with pulse oximeter and demonstrate accurate use of the pulse oximeter.;To learn and demonstrate proper pursed lip breathing techniques or other breathing techniques.;To learn and demonstrate proper use of respiratory medications  To learn and exhibit compliance with exercise, home and travel O2 prescription;To learn and understand importance of maintaining oxygen saturations>88%;To learn and understand importance of monitoring SPO2 with pulse oximeter and demonstrate accurate use of the pulse oximeter.;To learn and demonstrate proper pursed lip breathing techniques or other breathing techniques.;To learn and demonstrate proper use of respiratory medications      Long  Term Goals  Exhibits compliance with exercise, home and travel O2 prescription;Verbalizes importance of monitoring SPO2 with pulse oximeter and return  demonstration;Maintenance of O2 saturations>88%;Exhibits proper breathing techniques, such as pursed lip breathing or other method taught during program session;Compliance with respiratory medication;Demonstrates proper use of MDI's  Exhibits compliance with exercise, home and travel O2 prescription;Verbalizes importance of monitoring SPO2 with pulse oximeter and return demonstration;Maintenance of O2 saturations>88%;Exhibits proper breathing techniques,  such as pursed lip breathing or other method taught during program session;Compliance with respiratory medication;Demonstrates proper use of MDI's      Comments  Reviewed PLB technique with pt.  Talked about how it works and it's importance in maintaining their exercise saturations.  Ainara has been compliant with her oxygen at home.  She will sit and use her PLB to help get better control of her breathing when she feels that it is getting away from her.      Goals/Expected Outcomes  Short: Become more profiecient at using PLB.   Long: Become independent at using PLB.  Short: Continue to use PLB to help with breathing.  Long: Continued compliance. h         Oxygen Discharge (Final Oxygen Re-Evaluation): Oxygen Re-Evaluation - 11/15/19 1122      Program Oxygen Prescription   Program Oxygen Prescription  Continuous;E-Tanks    Liters per minute  3      Home Oxygen   Home Oxygen Device  Home Concentrator;E-Tanks;Portable Concentrator    Sleep Oxygen Prescription  CPAP;Continuous    Liters per minute  2    Home Exercise Oxygen Prescription  Continuous    Liters per minute  3    Home at Rest Exercise Oxygen Prescription  Continuous    Liters per minute  2.5    Compliance with Home Oxygen Use  Yes      Goals/Expected Outcomes   Short Term Goals  To learn and exhibit compliance with exercise, home and travel O2 prescription;To learn and understand importance of maintaining oxygen saturations>88%;To learn and understand importance of monitoring SPO2  with pulse oximeter and demonstrate accurate use of the pulse oximeter.;To learn and demonstrate proper pursed lip breathing techniques or other breathing techniques.;To learn and demonstrate proper use of respiratory medications    Long  Term Goals  Exhibits compliance with exercise, home and travel O2 prescription;Verbalizes importance of monitoring SPO2 with pulse oximeter and return demonstration;Maintenance of O2 saturations>88%;Exhibits proper breathing techniques, such as pursed lip breathing or other method taught during program session;Compliance with respiratory medication;Demonstrates proper use of MDI's    Comments  Vernadine has been compliant with her oxygen at home.  She will sit and use her PLB to help get better control of her breathing when she feels that it is getting away from her.    Goals/Expected Outcomes  Short: Continue to use PLB to help with breathing.  Long: Continued compliance. h       Initial Exercise Prescription: Initial Exercise Prescription - 09/22/19 1500      Date of Initial Exercise RX and Referring Provider   Date  09/22/19    Referring Provider  Glori Bickers MD      Oxygen   Oxygen  Continuous    Liters  3      Treadmill   MPH  0.8    Grade  0    Minutes  15    METs  1.6      NuStep   Level  1    SPM  80    Minutes  15    METs  1.5      T5 Nustep   Level  1    SPM  80    Minutes  15    METs  1.5      Biostep-RELP   Level  1    SPM  50    Minutes  15    METs  1  Prescription Details   Frequency (times per week)  2    Duration  Progress to 30 minutes of continuous aerobic without signs/symptoms of physical distress      Intensity   THRR 40-80% of Max Heartrate  94-126    Ratings of Perceived Exertion  11-13    Perceived Dyspnea  0-4      Progression   Progression  Continue to progress workloads to maintain intensity without signs/symptoms of physical distress.      Resistance Training   Training Prescription  Yes     Weight  3 lb    Reps  10-15       Perform Capillary Blood Glucose checks as needed.  Exercise Prescription Changes: Exercise Prescription Changes    Row Name 09/22/19 1500 10/04/19 1100 10/19/19 0900 11/01/19 1100 11/14/19 1300     Response to Exercise   Blood Pressure (Admit)  134/72  158/80  114/66  118/82  144/74   Blood Pressure (Exercise)  152/84  156/84  122/74  130/64  124/64   Blood Pressure (Exit)  126/72  126/64  102/60  106/62  104/64   Heart Rate (Admit)  62 bpm  78 bpm  85 bpm  80 bpm  80 bpm   Heart Rate (Exercise)  120 bpm  84 bpm  98 bpm  96 bpm  105 bpm   Heart Rate (Exit)  64 bpm  67 bpm  62 bpm  61 bpm  84 bpm   Oxygen Saturation (Admit)  99 %  97 %  90 %  94 %  96 %   Oxygen Saturation (Exercise)  85 %  95 %  93 %  97 %  93 %   Oxygen Saturation (Exit)  97 %  95 %  98 %  98 %  94 %   Rating of Perceived Exertion (Exercise)  _0 Perceived Dyspnea (Exercise)  _1 Symptoms  dizzy, hip hurting 8/10  --  fatigue  --  none   Comments  walk test results  first day  --  --  --   Duration  --  --  Continue with 30 min of aerobic exercise without signs/symptoms of physical distress.  Continue with 30 min of aerobic exercise without signs/symptoms of physical distress.  Continue with 30 min of aerobic exercise without signs/symptoms of physical distress.   Intensity  --  --  THRR unchanged  THRR unchanged  THRR unchanged     Progression   Progression  --  --  Continue to progress workloads to maintain intensity without signs/symptoms of physical distress.  Continue to progress workloads to maintain intensity without signs/symptoms of physical distress.  Continue to progress workloads to maintain intensity without signs/symptoms of physical distress.   Average METs  --  --  1.5  1.5  1.8     Resistance Training   Training Prescription  --  --  Yes  Yes  Yes   Weight  --  --  3 lb   3 lb   3 lb   Reps  --  --  10-15  10-15  10-15     Interval  Training   Interval Training  --  --  No  No  No     Treadmill   MPH  --  --  0.8  --  0.8   Grade  --  --  0  --  0   Minutes  --  --  15  --  15   METs  --  --  1.61  --  1.6     NuStep   Level  --  --  _0 SPM  --  --  --  80  --   Minutes  --  --  _1 METs  --  --  1.6  2  1.8     T5 Nustep   Level  --  --  1  --  1   Minutes  --  --  15  --  15   METs  --  --  1.8  --  1.8     Biostep-RELP   Level  --  --  _2 SPM  --  --  --  50  --   Minutes  --  --  _3 METs  --  --  _4 Row Name 11/15/19 1100 11/29/19 1300           Response to Exercise   Blood Pressure (Admit)  --  114/62      Blood Pressure (Exercise)  --  118/64      Blood Pressure (Exit)  --  130/82      Heart Rate (Admit)  --  85 bpm      Heart Rate (Exercise)  --  102 bpm      Heart Rate (Exit)  --  86 bpm      Oxygen Saturation (Admit)  --  90 %      Oxygen Saturation (Exercise)  --  92 %      Oxygen Saturation (Exit)  --  88 %      Rating of Perceived Exertion (Exercise)  --  15      Perceived Dyspnea (Exercise)  --  3      Symptoms  --  none      Duration  --  Continue with 30 min of aerobic exercise without signs/symptoms of physical distress.      Intensity  --  THRR unchanged        Progression   Progression  --  Continue to progress workloads to maintain intensity without signs/symptoms of physical distress.      Average METs  --  2.25        Resistance Training   Training Prescription  --  Yes      Weight  --  3 lb      Reps  --  10-15        Interval Training   Interval Training  --  No        T5 Nustep   Level  --  4      SPM  --  80      Minutes  --  15      METs  --  2.5        Biostep-RELP   Level  --  1      SPM  --  50      Minutes  --  15      METs  --  2        Home Exercise Plan   Plans to continue exercise at  Home (comment) walking, staff videos  Home (comment) walking,  staff videos      Frequency  Add 2 additional days to  program exercise sessions.  Add 2 additional days to program exercise sessions.      Initial Home Exercises Provided  11/15/19  11/15/19         Exercise Comments: Exercise Comments    Row Name 10/25/19 1240           Exercise Comments  Pulse oximeter not picking up HR. Placed on monitor to determine HR and rhythm. Found to be in A Fib. Note sent to referring physician. Patient is not aware of ever being in A fib. She is on Warfarin daily.          Exercise Goals and Review: Exercise Goals    Row Name 09/22/19 1540             Exercise Goals   Increase Physical Activity  Yes       Intervention  Provide advice, education, support and counseling about physical activity/exercise needs.;Develop an individualized exercise prescription for aerobic and resistive training based on initial evaluation findings, risk stratification, comorbidities and participant's personal goals.       Expected Outcomes  Short Term: Attend rehab on a regular basis to increase amount of physical activity.;Long Term: Add in home exercise to make exercise part of routine and to increase amount of physical activity.;Long Term: Exercising regularly at least 3-5 days a week.       Increase Strength and Stamina  Yes       Intervention  Provide advice, education, support and counseling about physical activity/exercise needs.;Develop an individualized exercise prescription for aerobic and resistive training based on initial evaluation findings, risk stratification, comorbidities and participant's personal goals.       Expected Outcomes  Short Term: Increase workloads from initial exercise prescription for resistance, speed, and METs.;Short Term: Perform resistance training exercises routinely during rehab and add in resistance training at home;Long Term: Improve cardiorespiratory fitness, muscular endurance and strength as measured by increased METs and functional capacity (6MWT)       Able to understand and use rate of  perceived exertion (RPE) scale  Yes       Intervention  Provide education and explanation on how to use RPE scale       Expected Outcomes  Short Term: Able to use RPE daily in rehab to express subjective intensity level;Long Term:  Able to use RPE to guide intensity level when exercising independently       Able to understand and use Dyspnea scale  Yes       Intervention  Provide education and explanation on how to use Dyspnea scale       Expected Outcomes  Short Term: Able to use Dyspnea scale daily in rehab to express subjective sense of shortness of breath during exertion;Long Term: Able to use Dyspnea scale to guide intensity level when exercising independently       Knowledge and understanding of Target Heart Rate Range (THRR)  Yes       Intervention  Provide education and explanation of THRR including how the numbers were predicted and where they are located for reference       Expected Outcomes  Short Term: Able to state/look up THRR;Short Term: Able to use daily as guideline for intensity in rehab;Long Term: Able to use THRR to govern intensity when exercising independently       Able to check pulse independently  Yes       Intervention  Provide education and demonstration on how to check pulse in carotid and radial arteries.;Review the importance of being able to check your own pulse for safety during independent exercise       Expected Outcomes  Short Term: Able to explain why pulse checking is important during independent exercise;Long Term: Able to check pulse independently and accurately       Understanding of Exercise Prescription  Yes       Intervention  Provide education, explanation, and written materials on patient's individual exercise prescription       Expected Outcomes  Short Term: Able to explain program exercise prescription;Long Term: Able to explain home exercise prescription to exercise independently          Exercise Goals Re-Evaluation : Exercise Goals Re-Evaluation     Row Name 09/27/19 1122 10/19/19 0942 11/01/19 1132 11/14/19 1339 11/15/19 1117     Exercise Goal Re-Evaluation   Exercise Goals Review  Increase Physical Activity;Able to understand and use rate of perceived exertion (RPE) scale;Knowledge and understanding of Target Heart Rate Range (THRR);Understanding of Exercise Prescription;Increase Strength and Stamina;Able to understand and use Dyspnea scale;Able to check pulse independently  Increase Physical Activity;Increase Strength and Stamina;Understanding of Exercise Prescription  Increase Physical Activity;Increase Strength and Stamina;Able to understand and use rate of perceived exertion (RPE) scale;Able to understand and use Dyspnea scale;Knowledge and understanding of Target Heart Rate Range (THRR);Able to check pulse independently;Understanding of Exercise Prescription  Increase Physical Activity;Increase Strength and Stamina;Understanding of Exercise Prescription  Increase Physical Activity;Increase Strength and Stamina;Understanding of Exercise Prescription   Comments  Reviewed RPE scale, THR and program prescription with pt today.  Pt voiced understanding and was given a copy of goals to take home.  Lauri is off to a good start in rehab. She is already up to the full 15 min on the treadmill. We will continue to monitor her progress.  Gredmarie has increased from .6-.8 on TM.  Staff will monitor progress.  Caedyn is doing well in rehab.  She is now up to 2 METs on the BioStep.  We will encourage her to increase workloads and continue to monitor her progress.  Aaleeyah is doing well in rehab.  She over did it some trying to get ready for company, but she was able.  She is doing some exericse at home.  Reviewed home exercise with pt today.  Pt plans to walk and use videos at home for exercise.  Reviewed THR, pulse, RPE, sign and symptoms, and when to call 911 or MD.  Also discussed weather considerations and indoor options.  Pt voiced understanding.   Expected  Outcomes  Short: Use RPE daily to regulate intensity. Long: Follow program prescription in THR.  Short: Continue to attend rehab regularly.  Long: Continue to follow program prescription.  Short:  attend class consistently Long : improve overall stamina  Short: Increase workloads.  Long: Continue to improve stamina.  Short: Start to add in more exercise at home.  Long: Continue to improve stamina.   [place] Name 11/29/19 1332             Exercise Goal Re-Evaluation   Exercise Goals Review  Increase Physical Activity;Increase Strength and Stamina;Able to understand and use rate of perceived exertion (RPE) scale;Able to understand and use Dyspnea scale;Knowledge and understanding of Target Heart Rate Range (THRR);Able to check pulse independently;Understanding of Exercise Prescription       Comments  Rachana has attended consistently and is up to level 4 on NS.  She works in correct THR and RPE range.       Expected Outcomes  Short:  increase level on Biostep Long:  improve overall stamina          Discharge Exercise Prescription (Final Exercise Prescription Changes): Exercise Prescription Changes - 11/29/19 1300      Response to Exercise   Blood Pressure (Admit)  114/62    Blood Pressure (Exercise)  118/64    Blood Pressure (Exit)  130/82    Heart Rate (Admit)  85 bpm    Heart Rate (Exercise)  102 bpm    Heart Rate (Exit)  86 bpm    Oxygen Saturation (Admit)  90 %    Oxygen Saturation (Exercise)  92 %    Oxygen Saturation (Exit)  88 %    Rating of Perceived Exertion (Exercise)  15    Perceived Dyspnea (Exercise)  3    Symptoms  none    Duration  Continue with 30 min of aerobic exercise without signs/symptoms of physical distress.    Intensity  THRR unchanged      Progression   Progression  Continue to progress workloads to maintain intensity without signs/symptoms of physical distress.    Average METs  2.25      Resistance Training   Training Prescription  Yes    Weight  3 lb    Reps   10-15      Interval Training   Interval Training  No      T5 Nustep   Level  4    SPM  80    Minutes  15    METs  2.5      Biostep-RELP   Level  1    SPM  50    Minutes  15    METs  2      Home Exercise Plan   Plans to continue exercise at  Home (comment)   walking, staff videos   Frequency  Add 2 additional days to program exercise sessions.    Initial Home Exercises Provided  11/15/19       Nutrition:  Target Goals: Understanding of nutrition guidelines, daily intake of sodium <1591m, cholesterol <2058m calories 30% from fat and 7% or less from saturated fats, daily to have 5 or more servings of fruits and vegetables.  Education: Controlling Sodium/Reading Food Labels -Group verbal and written material supporting the discussion of sodium use in heart healthy nutrition. Review and explanation with models, verbal and written materials for utilization of the food label.   Education: General Nutrition Guidelines/Fats and Fiber: -Group instruction provided by verbal, written material, models and posters to present the general guidelines for heart healthy nutrition. Gives an explanation and review of dietary fats and fiber.   Biometrics: Pre Biometrics - 09/22/19 1541      Pre Biometrics   Height  5' 6.5" (1.689 m)    Weight  250 lb 12.8 oz (113.8 kg)    BMI (Calculated)  39.88    Single Leg Stand  1.53 seconds        Nutrition Therapy Plan and Nutrition Goals: Nutrition Therapy & Goals - 09/26/19 1503      Nutrition Therapy   Diet  HH diet    Drug/Food Interactions  Statins/Certain Fruits;Coumadin/Vit K    Protein (specify units)  125.18g    Fiber  25 grams    Whole Grain Foods  3 servings    Saturated Fats  12 max. grams    Fruits  and Vegetables  5 servings/day    Sodium  1.5 grams      Personal Nutrition Goals   Nutrition Goal  ST: include more vegetables in an easier manner LT: able to get around better, would like to increase balance, MD said to lose  weight.    Comments  Pt reports able to do ADL. Toast with peanut butter (wheat, unless its expensive) with 2 cups of coffee (black)and  Juice glass of cranberry juice (mostly water). Pimento cheese cracker or banana and peanut butter, cereal, yogurt.  Meatloaf and baked potato. Pt reports not eating many vegetables. Snacks on fruit. Pt on long lasting insulin; checks BG every morning - takes insulin at night. Discussed HH eating, set-point weight, pulmonary MNT. Lipitor (statin),Colace,Lasix,T2DM: januvia, Insulin Glargine (long lasting),Coumadin,Vitamin D, CKD stg 4, CHF, Pulmonary hypertension      Intervention Plan   Intervention  Nutrition handout(s) given to patient.    Expected Outcomes  Short Term Goal: Understand basic principles of dietary content, such as calories, fat, sodium, cholesterol and nutrients.;Short Term Goal: A plan has been developed with personal nutrition goals set during dietitian appointment.;Long Term Goal: Adherence to prescribed nutrition plan.       Nutrition Assessments: Nutrition Assessments - 09/22/19 1542      MEDFICTS Scores   Pre Score  56       MEDIFICTS Score Key:          ?70 Need to make dietary changes          40-70 Heart Healthy Diet         ? 40 Therapeutic Level Cholesterol Diet  Nutrition Goals Re-Evaluation: Nutrition Goals Re-Evaluation    [place] Name 10/18/19 1126 11/24/19 1130           Goals   Nutrition Goal  ST: include more vegetables in an easier manner LT: able to get around better, would like to increase balance, MD said to lose weight.  ST: include more vegetables in an easier manner LT: able to get around better, would like to increase balance, MD said to lose weight.      Comment  Pt reports including more fruits and vegetables and is doing well incorporating them into her meals. discussed consistent eating and BG.  Pt reports being able to include more F/V to diet, carrots, cucumbers, tomatoes, bananas, and cuties. Gave pt some  ideas like tzasiki dip and gave handouts.      Expected Outcome  ST: include more vegetables in an easier manner LT: able to get around better, would like to increase balance, MD said to lose weight.  ST: include more vegetables in an easier manner LT: able to get around better, would like to increase balance, MD said to lose weight.         Nutrition Goals Discharge (Final Nutrition Goals Re-Evaluation): Nutrition Goals Re-Evaluation - 11/24/19 1130      Goals   Nutrition Goal  ST: include more vegetables in an easier manner LT: able to get around better, would like to increase balance, MD said to lose weight.    Comment  Pt reports being able to include more F/V to diet, carrots, cucumbers, tomatoes, bananas, and cuties. Gave pt some ideas like tzasiki dip and gave handouts.    Expected Outcome  ST: include more vegetables in an easier manner LT: able to get around better, would like to increase balance, MD said to lose weight.       Psychosocial: Target Goals:  Acknowledge presence or absence of significant depression and/or stress, maximize coping skills, provide positive support system. Participant is able to verbalize types and ability to use techniques and skills needed for reducing stress and depression.   Education: Depression - Provides group verbal and written instruction on the correlation between heart/lung disease and depressed mood, treatment options, and the stigmas associated with seeking treatment.   Education: Sleep Hygiene -Provides group verbal and written instruction about how sleep can affect your health.  Define sleep hygiene, discuss sleep cycles and impact of sleep habits. Review good sleep hygiene tips.    Education: Stress and Anxiety: - Provides group verbal and written instruction about the health risks of elevated stress and causes of high stress.  Discuss the correlation between heart/lung disease and anxiety and treatment options. Review healthy ways to  manage with stress and anxiety.   Initial Review & Psychosocial Screening: Initial Psych Review & Screening - 09/21/19 0817      Initial Review   Current issues with  Current Depression;History of Depression;Current Psychotropic Meds;Current Stress Concerns    Source of Stress Concerns  Chronic Illness;Family;Retirement/disability    Comments  History of depression from loss of husband six years ago and started taking Elavil and she feels like her meds are working.  She recently moved down here about 7 months ago and the pandemic has made it hard to get acquainted.  She has two kids, daughter in [place] and son in [place].      Family Dynamics   Good Support System?  Yes   Son and daughter are support system and live near by, hard to make new friends   Comments  Lost husband six years ago and just moved here 7 months ago      Barriers   Psychosocial barriers to participate in program  The patient should benefit from training in stress management and relaxation.;Psychosocial barriers identified (see note)      Screening Interventions   Interventions  Encouraged to exercise;Program counselor consult;To provide support and resources with identified psychosocial needs;Provide feedback about the scores to participant    Expected Outcomes  Short Term goal: Utilizing psychosocial counselor, staff and physician to assist with identification of specific Stressors or current issues interfering with healing process. Setting desired goal for each stressor or current issue identified.;Long Term Goal: Stressors or current issues are controlled or eliminated.;Short Term goal: Identification and review with participant of any Quality of Life or Depression concerns found by scoring the questionnaire.;Long Term goal: The participant improves quality of Life and PHQ9 Scores as seen by post scores and/or verbalization of changes       Quality of Life Scores:  Scores of 19 and below usually indicate a  poorer quality of life in these areas.  A difference of  2-3 points is a clinically meaningful difference.  A difference of 2-3 points in the total score of the Quality of Life Index has been associated with significant improvement in overall quality of life, self-image, physical symptoms, and general health in studies assessing change in quality of life.  PHQ-9: Recent Review Flowsheet Data    Depression screen [facility] 2/9 10/11/2019 09/22/2019 03/28/2019   Decreased Interest 1 1 0   Down, Depressed, Hopeless _0 PHQ - 2 Score _1 Altered sleeping 1 2 -   Tired, decreased energy 1 3 -   Change in appetite 1 2 -   Feeling bad or failure about yourself  1 2 -   Trouble concentrating 0 0 -   Moving slowly or fidgety/restless 0 0 -   Suicidal thoughts 0 0 -   PHQ-9 Score 6 11 -   Difficult doing work/chores Somewhat difficult Somewhat difficult -     Interpretation of Total Score  Total Score Depression Severity:  1-4 = Minimal depression, 5-9 = Mild depression, 10-14 = Moderate depression, 15-19 = Moderately severe depression, 20-27 = Severe depression   Psychosocial Evaluation and Intervention: Psychosocial Evaluation - 09/21/19 0831      Psychosocial Evaluation & Interventions   Interventions  Encouraged to exercise with the program and follow exercise prescription    Comments  Ms. Swetz is coming into Pulmonary Rehab with history of pulmonary hypertension, ILD, and heart failure.  She moved to the area seven months ago and has found it hard to settle in and make friends during the pandemic.  Her kids live close by (daughter in [place] and son in [place]).  She lost her husband six years ago and decided it was time to be closer to her kids. She really wants to get into a routine and start moving more as the pandemic has turned her into a couch potato.  She is currently needing to be wheeled around at the hospital but we are going to work with her to be able to walk around on her  own by building strength and stamina.  Coming to class will also offer her a social outlet and chance to meet some people in the area.  We look forward to working with her.    Expected Outcomes  Short: Build up strength and stamina for improved mobility.  Long: Make adjustments to living in the area.    Continue Psychosocial Services   Follow up required by staff       Psychosocial Re-Evaluation: Psychosocial Re-Evaluation    [place] Name 10/27/19 1136 11/15/19 1118           Psychosocial Re-Evaluation   Current issues with  Current Stress Concerns;Current Sleep Concerns  Current Stress Concerns      Comments  Saidee moved here from [place] 8 months ago.  She hasnt been able to meet many people yet.  She does have family here - daughter and son in law.  She states she sleeps pretty well - around 6 hours a night.  Takia is doing well in rehab. She is feeling good overall and mentally.  She has met a few more people coming to class, but hard to talk while exercising.  She enjoys being with people.  She continues to sleep well.      Expected Outcomes  Short: continue to attend [facility] for the exercise Long:  meet more people locally  Short: Continue to try to get out to meet people  Long: Continue to stay positive.      Interventions  Encouraged to attend Pulmonary Rehabilitation for the exercise  Encouraged to attend Pulmonary Rehabilitation for the exercise      Continue Psychosocial Services   --  Follow up required by staff         Psychosocial Discharge (Final Psychosocial Re-Evaluation): Psychosocial Re-Evaluation - 11/15/19 1118      Psychosocial Re-Evaluation   Current issues with  Current Stress Concerns    Comments  Laysha is doing well in rehab. She is feeling good overall and mentally.  She has met a few more people coming to class, but hard to talk while exercising.  She enjoys being with people.  She continues to sleep well.    Expected Outcomes  Short: Continue to try to get out to  meet people  Long: Continue to stay positive.    Interventions  Encouraged to attend Pulmonary Rehabilitation for the exercise    Continue Psychosocial Services   Follow up required by staff       Education: Education Goals: Education classes will be provided on a weekly basis, covering required topics. Participant will state understanding/return demonstration of topics presented.  Learning Barriers/Preferences: Learning Barriers/Preferences - 09/21/19 1324      Learning Barriers/Preferences   Learning Barriers  Sight   glasses   Learning Preferences  None       General Pulmonary Education Topics:  Infection Prevention: - Provides verbal and written material to individual with discussion of infection control including proper hand washing and proper equipment cleaning during exercise session.   Pulmonary Rehab from 09/22/2019 in [facility] Cardiac and Pulmonary Rehab  Date  09/22/19  Educator  [facility]  Instruction Review Code  1- Verbalizes Understanding      Falls Prevention: - Provides verbal and written material to individual with discussion of falls prevention and safety.   Pulmonary Rehab from 09/22/2019 in [facility] and Pulmonary Rehab  Date  09/22/19  Educator  [facility]  Instruction Review Code  1- Verbalizes Understanding      Chronic Lung Diseases: - Group verbal and written instruction to review updates, respiratory medications, advancements in procedures and treatments. Discuss use of supplemental oxygen including available portable oxygen systems, continuous and intermittent flow rates, concentrators, personal use and safety guidelines. Review proper use of inhaler and spacers. Provide informative websites for self-education.    Energy Conservation: - Provide group verbal and written instruction for methods to conserve energy, plan and organize activities. Instruct on pacing techniques, use of adaptive equipment and posture/positioning to relieve shortness of  breath.   Triggers and Exacerbations: - Group verbal and written instruction to review types of environmental triggers and ways to prevent exacerbations. Discuss weather changes, air quality and the benefits of nasal washing. Review warning signs and symptoms to help prevent infections. Discuss techniques for effective airway clearance, coughing, and vibrations.   AED/CPR: - Group verbal and written instruction with the use of models to demonstrate the basic use of the AED with the basic ABC's of resuscitation.   Anatomy and Physiology of the Lungs: - Group verbal and written instruction with the use of models to provide basic lung anatomy and physiology related to function, structure and complications of lung disease.   Anatomy & Physiology of the Heart: - Group verbal and written instruction and models provide basic cardiac anatomy and physiology, with the coronary electrical and arterial systems. Review of Valvular disease and Heart Failure   Cardiac Medications: - Group verbal and written instruction to review commonly prescribed medications for heart disease. Reviews the medication, class of the drug, and side effects.   Other: -Provides group and verbal instruction on various topics (see comments)   Knowledge Questionnaire Score: Knowledge Questionnaire Score - 09/22/19 1542      Knowledge Questionnaire Score   Pre Score  18/18        Core Components/Risk Factors/Patient Goals at Admission: Personal Goals and Risk Factors at Admission - 09/21/19 0856      Core Components/Risk Factors/Patient Goals on Admission    Weight Management  Yes;Weight Loss    Intervention  Weight Management: Develop a combined nutrition and exercise program  designed to reach desired caloric intake, while maintaining appropriate intake of nutrient and fiber, sodium and fats, and appropriate energy expenditure required for the weight goal.;Weight Management: Provide education and appropriate  resources to help participant work on and attain dietary goals.    Expected Outcomes  Short Term: Continue to assess and modify interventions until short term weight is achieved;Long Term: Adherence to nutrition and physical activity/exercise program aimed toward attainment of established weight goal;Weight Loss: Understanding of general recommendations for a balanced deficit meal plan, which promotes 1-2 lb weight loss per week and includes a negative energy balance of [id number] kcal/d;Understanding recommendations for meals to include 15-35% energy as protein, 25-35% energy from fat, 35-60% energy from carbohydrates, less than 276m of dietary cholesterol, 20-35 gm of total fiber daily;Understanding of distribution of calorie intake throughout the day with the consumption of 4-5 meals/snacks    Diabetes  Yes    Intervention  Provide education about signs/symptoms and action to take for hypo/hyperglycemia.;Provide education about proper nutrition, including hydration, and aerobic/resistive exercise prescription along with prescribed medications to achieve blood glucose in normal ranges: Fasting glucose 65-99 mg/dL    Expected Outcomes  Short Term: Participant verbalizes understanding of the signs/symptoms and immediate care of hyper/hypoglycemia, proper foot care and importance of medication, aerobic/resistive exercise and nutrition plan for blood glucose control.;Long Term: Attainment of HbA1C < 7%.    Heart Failure  Yes    Intervention  Provide a combined exercise and nutrition program that is supplemented with education, support and counseling about heart failure. Directed toward relieving symptoms such as shortness of breath, decreased exercise tolerance, and extremity edema.    Expected Outcomes  Improve functional capacity of life;Short term: Attendance in program 2-3 days a week with increased exercise capacity. Reported lower sodium intake. Reported increased fruit and vegetable intake. Reports  medication compliance.;Short term: Daily weights obtained and reported for increase. Utilizing diuretic protocols set by physician.;Long term: Adoption of self-care skills and reduction of barriers for early signs and symptoms recognition and intervention leading to self-care maintenance.    Hypertension  Yes    Intervention  Provide education on lifestyle modifcations including regular physical activity/exercise, weight management, moderate sodium restriction and increased consumption of fresh fruit, vegetables, and low fat dairy, alcohol moderation, and smoking cessation.;Monitor prescription use compliance.    Expected Outcomes  Short Term: Continued assessment and intervention until BP is < 140/938mHG in hypertensive participants. < 130/809mG in hypertensive participants with diabetes, heart failure or chronic kidney disease.;Long Term: Maintenance of blood pressure at goal levels.    Lipids  Yes    Intervention  Provide education and support for participant on nutrition & aerobic/resistive exercise along with prescribed medications to achieve LDL <93m21mDL >40mg76m Expected Outcomes  Short Term: Participant states understanding of desired cholesterol values and is compliant with medications prescribed. Participant is following exercise prescription and nutrition guidelines.;Long Term: Cholesterol controlled with medications as prescribed, with individualized exercise RX and with personalized nutrition plan. Value goals: LDL < 93mg,69m > 40 mg.       Education:Diabetes - Individual verbal and written instruction to review signs/symptoms of diabetes, desired ranges of glucose level fasting, after meals and with exercise. Acknowledge that pre and post exercise glucose checks will be done for 3 sessions at entry of program.   Education: Know Your Numbers and Risk Factors: -Group verbal and written instruction about important numbers in your health.  Discussion of what are risk factors and  how  they play a role in the disease process.  Review of Cholesterol, Blood Pressure, Diabetes, and BMI and the role they play in your overall health.   Core Components/Risk Factors/Patient Goals Review:  Goals and Risk Factor Review    [place] Name 10/27/19 1134 11/15/19 1120           Core Components/Risk Factors/Patient Goals Review   Personal Goals Review  Weight Management/Obesity;Improve shortness of breath with ADL's;Hypertension;Lipids;Heart Failure  Weight Management/Obesity;Improve shortness of breath with ADL's;Hypertension;Lipids;Heart Failure      Review  Zona is taking all meds as directed.  Her weight was up today and she will call her Dr to follow up about fluid weight.  She feels better since she started exercising.  She can do some ADLs easier.  Erikah is watching her weight and it stays pretty steady with 3-4 lb range.  She has not talked to doctor about her fluid levels and watching her heart failure.  She denies any other heart failure symptoms.  Blood pressures have been good overall.      Expected Outcomes  Short:  follow up with Dr Laverta [place]: manage risk factors  Short: Talk to doctor about fluid levels  Long: Continue to manage heart failure         Core Components/Risk Factors/Patient Goals at Discharge (Final Review):  Goals and Risk Factor Review - 11/15/19 1120      Core Components/Risk Factors/Patient Goals Review   Personal Goals Review  Weight Management/Obesity;Improve shortness of breath with ADL's;Hypertension;Lipids;Heart Failure    Review  Artice is watching her weight and it stays pretty steady with 3-4 lb range.  She has not talked to doctor about her fluid levels and watching her heart failure.  She denies any other heart failure symptoms.  Blood pressures have been good overall.    Expected Outcomes  Short: Talk to doctor about fluid levels  Long: Continue to manage heart failure       ITP Comments: ITP Comments    Row Name 09/21/19 0857 09/22/19 1534 09/26/19  1536 09/27/19 1120 10/12/19 0625   ITP Comments  Completed virtual orientation today.  EP eval scheduled for 2/4 at 930. Documentation for diagnosis can be found in [facility] encounter 09/07/19.  Completed 6MWT and gym orientation.  Initial ITP created and sent for review to Dr. Emily Filbert, Medical Director.  Completed Initial RD Eval  First full day of exercise!  Patient was oriented to gym and equipment including functions, settings, policies, and procedures.  Patient's individual exercise prescription and treatment plan were reviewed.  All starting workloads were established based on the results of the 6 minute walk test done at initial orientation visit.  The plan for exercise progression was also introduced and progression will be customized based on patient's performance and goals.  30 day chart review completed. ITP sent to Dr Zachery Dakins Medical Director, for review,changes as needed and signature. New to program   Row Name 10/25/19 1240 11/08/19 1138 11/09/19 0606 12/07/19 0537     ITP Comments  Pulse oximeter not picking up HR. Placed on monitor to determine HR and rhythm. Found to be in A Fib. Note sent to referring physician. Patient is not aware of ever being in A fib. She is on Warfarin daily.  Dr. Rockey Situ returned fax of afib stating to continue with exercise program  30 day chart review completed. ITP sent to Dr Zachery Dakins Medical Director, for review,changes as needed and signature. Continue with ITP  if no changes requested  30 Day review completed. Medical Director review done, changes made as directed,and approval shown by signature of Market researcher.       Comments:

## 2019-12-13 ENCOUNTER — Other Ambulatory Visit: Payer: Self-pay | Admitting: Physician Assistant

## 2019-12-13 ENCOUNTER — Other Ambulatory Visit: Payer: Self-pay

## 2019-12-13 ENCOUNTER — Encounter: Payer: Medicare Other | Admitting: *Deleted

## 2019-12-13 DIAGNOSIS — I272 Pulmonary hypertension, unspecified: Secondary | ICD-10-CM

## 2019-12-13 DIAGNOSIS — I270 Primary pulmonary hypertension: Secondary | ICD-10-CM | POA: Diagnosis not present

## 2019-12-13 NOTE — Telephone Encounter (Signed)
Requested medication (s) are due for refill today: unsure  Requested medication (s) are on the active medication list: no  Last refill: Not on medication or listed in medication Hx  Future visit scheduled: yes  Notes to clinic:  Patient is diabetic but this product is not on the current medication list or in medication hx.    Requested Prescriptions  Pending Prescriptions Disp Refills   ONETOUCH ULTRA test strip [Pharmacy Med Name: ONETOUCH ULTRA STRIP] 100 each     Sig: USE AS DIRECTED      Endocrinology: Diabetes - Testing Supplies Passed - 12/13/2019  4:44 PM      Passed - Valid encounter within last 12 months    Recent Outpatient Visits           2 months ago Chronic pulmonary embolism, unspecified pulmonary embolism type, unspecified whether acute cor pulmonale present [facility])   [facility] [place], Fabio Bering M, PA-C   4 months ago History of stroke   [place], [place], PA-C   5 months ago Diabetes mellitus without complication [facility])   [place], Erma, PA-C   8 months ago Congestive heart failure, unspecified HF chronicity, unspecified heart failure type [facility])   [facility] Trinna Post, PA-C       Future Appointments             In 1 month Terrilee Croak, Wendee Beavers, PA-C [employer], [facility]

## 2019-12-13 NOTE — Progress Notes (Signed)
Daily Session Note  Patient Details  Name: Ann Flynn MRN: [id number] Date of Birth: 03-31-1941 Referring Provider:     Pulmonary Rehab from 09/22/2019 in [facility] Cardiac and Pulmonary Rehab  Referring Provider  Glori Bickers MD      Encounter Date: 12/13/2019  Check In: Session Check In - 12/13/19 1144      Check-In   Supervising physician immediately available to respond to emergencies  See telemetry face sheet for immediately available ER MD    Location  [facility]-Cardiac & Pulmonary Rehab    Staff Present  Heath Lark, RN, BSN, CCRP;Joseph Hood RCP,RRT,BSRT;Amanda Oletta Darter, [place], ACSM CEP, Exercise Physiologist    Virtual Visit  No    Medication changes reported      No    Fall or balance concerns reported     No    Warm-up and Cool-down  Performed on first and last piece of equipment    Resistance Training Performed  Yes    VAD Patient?  No    PAD/SET Patient?  No      Pain Assessment   Currently in Pain?  No/denies          Social History   Tobacco Use  Smoking Status Never Smoker  Smokeless Tobacco Never Used    Goals Met:  Proper associated with RPD/PD & O2 Sat Independence with exercise equipment Exercise tolerated well No report of cardiac concerns or symptoms  Goals Unmet:  Not Applicable  Comments: Pt able to follow exercise prescription today without complaint.  Will continue to monitor for progression.    Dr. Emily Filbert is Medical Director for [place] and [facility].

## 2019-12-14 NOTE — Telephone Encounter (Signed)
Pharmacy sent refill request for these test strips which are not listed in patient's active medication list. I tried calling patient to verify that she needed this particular test strip but received no answer. Please review and advise if ok to send in refill

## 2019-12-15 ENCOUNTER — Other Ambulatory Visit: Payer: Self-pay | Admitting: Nephrology

## 2019-12-15 DIAGNOSIS — N184 Chronic kidney disease, stage 4 (severe): Secondary | ICD-10-CM

## 2019-12-20 ENCOUNTER — Other Ambulatory Visit: Payer: Self-pay

## 2019-12-20 ENCOUNTER — Encounter: Payer: Medicare Other | Attending: Internal Medicine | Admitting: *Deleted

## 2019-12-20 ENCOUNTER — Other Ambulatory Visit: Payer: Self-pay | Admitting: Physician Assistant

## 2019-12-20 ENCOUNTER — Ambulatory Visit: Payer: Medicare Other

## 2019-12-20 DIAGNOSIS — E118 Type 2 diabetes mellitus with unspecified complications: Secondary | ICD-10-CM | POA: Insufficient documentation

## 2019-12-20 DIAGNOSIS — Z7901 Long term (current) use of anticoagulants: Secondary | ICD-10-CM | POA: Insufficient documentation

## 2019-12-20 DIAGNOSIS — Z794 Long term (current) use of insulin: Secondary | ICD-10-CM | POA: Insufficient documentation

## 2019-12-20 DIAGNOSIS — I270 Primary pulmonary hypertension: Secondary | ICD-10-CM

## 2019-12-20 DIAGNOSIS — I272 Pulmonary hypertension, unspecified: Secondary | ICD-10-CM

## 2019-12-20 DIAGNOSIS — Z79899 Other long term (current) drug therapy: Secondary | ICD-10-CM | POA: Insufficient documentation

## 2019-12-20 NOTE — Progress Notes (Signed)
Incomplete Session Note  Patient Details  Name: Ann Flynn MRN: [id number] Date of Birth: 11-04-1940 Referring Provider:     Pulmonary Rehab from 09/22/2019 in [facility] Cardiac and Pulmonary Rehab  Referring Provider  Glori Bickers MD      Ann Flynn did not complete her rehab session.  Lavada arrived with resting HR 125. She had not taken the last two doses of her beta blockers as she had run out.  She was leaving to go straight to her pharmacy for refill

## 2019-12-21 ENCOUNTER — Ambulatory Visit
Admission: RE | Admit: 2019-12-21 | Discharge: 2019-12-21 | Disposition: A | Discharge status: Home / Self Care | Payer: Medicare Other | Source: Ambulatory Visit | Attending: Nephrology | Admitting: Nephrology

## 2019-12-21 ENCOUNTER — Other Ambulatory Visit: Payer: Self-pay

## 2019-12-21 DIAGNOSIS — N184 Chronic kidney disease, stage 4 (severe): Secondary | ICD-10-CM | POA: Diagnosis present

## 2019-12-22 ENCOUNTER — Encounter: Payer: Medicare Other | Admitting: *Deleted

## 2019-12-22 DIAGNOSIS — E118 Type 2 diabetes mellitus with unspecified complications: Secondary | ICD-10-CM | POA: Diagnosis not present

## 2019-12-22 DIAGNOSIS — Z7901 Long term (current) use of anticoagulants: Secondary | ICD-10-CM | POA: Diagnosis not present

## 2019-12-22 DIAGNOSIS — Z79899 Other long term (current) drug therapy: Secondary | ICD-10-CM | POA: Diagnosis not present

## 2019-12-22 DIAGNOSIS — I272 Pulmonary hypertension, unspecified: Secondary | ICD-10-CM

## 2019-12-22 DIAGNOSIS — Z794 Long term (current) use of insulin: Secondary | ICD-10-CM | POA: Diagnosis not present

## 2019-12-22 DIAGNOSIS — I270 Primary pulmonary hypertension: Secondary | ICD-10-CM | POA: Diagnosis not present

## 2019-12-22 NOTE — Progress Notes (Signed)
Daily Session Note  Patient Details  Name: Ann Flynn MRN: [id number] Date of Birth: Jan 17, 1941 Referring Provider:     Pulmonary Rehab from 09/22/2019 in [facility] Cardiac and Pulmonary Rehab  Referring Provider  Glori Bickers MD      Encounter Date: 12/22/2019  Check In: Session Check In - 12/22/19 1125      Check-In   Supervising physician immediately available to respond to emergencies  See telemetry face sheet for immediately available ER MD    Location  [facility]-Cardiac & Pulmonary Rehab    Staff Present  Renita Papa, RN BSN;Joseph Hood RCP,RRT,BSRT;Melissa Moscow RDN, [place]avy, [place], ACSM CEP, Exercise Physiologist    Virtual Visit  No    Medication changes reported      No    Fall or balance concerns reported     No    Warm-up and Cool-down  Performed on first and last piece of equipment    Resistance Training Performed  Yes    VAD Patient?  No    PAD/SET Patient?  No      Pain Assessment   Currently in Pain?  No/denies          Social History   Tobacco Use  Smoking Status Never Smoker  Smokeless Tobacco Never Used    Goals Met:  Independence with exercise equipment Exercise tolerated well No report of cardiac concerns or symptoms Strength training completed today  Goals Unmet:  Not Applicable  Comments: Pt able to follow exercise prescription today without complaint.  Will continue to monitor for progression.    Dr. Emily Filbert is Medical Director for [place] and [facility].

## 2019-12-27 ENCOUNTER — Encounter: Payer: Medicare Other | Admitting: *Deleted

## 2019-12-27 ENCOUNTER — Other Ambulatory Visit: Payer: Self-pay

## 2019-12-27 DIAGNOSIS — I270 Primary pulmonary hypertension: Secondary | ICD-10-CM | POA: Diagnosis not present

## 2019-12-27 DIAGNOSIS — I272 Pulmonary hypertension, unspecified: Secondary | ICD-10-CM

## 2019-12-27 NOTE — Progress Notes (Signed)
Daily Session Note  Patient Details  Name: Ann Flynn MRN: [id number] Date of Birth: [birth year] Referring Provider:     Pulmonary Rehab from 09/22/2019 in [facility] Cardiac and Pulmonary Rehab  Referring Provider  Glori Bickers MD      Encounter Date: 12/27/2019  Check In: Session Check In - 12/27/19 1202      Check-In   Supervising physician immediately available to respond to emergencies  See telemetry face sheet for immediately available ER MD    Location  [facility]-Cardiac & Pulmonary Rehab    Staff Present  Heath Lark, RN, BSN, CCRP;Amanda Sommer, BA, ACSM CEP, Exercise Physiologist;Joseph Hood RCP,RRT,BSRT    Virtual Visit  No    Medication changes reported      No    Fall or balance concerns reported     No    Warm-up and Cool-down  Performed on first and last piece of equipment    Resistance Training Performed  Yes    VAD Patient?  No    PAD/SET Patient?  No      Pain Assessment   Currently in Pain?  No/denies          Social History   Tobacco Use  Smoking Status Never Smoker  Smokeless Tobacco Never Used    Goals Met:  Proper associated with RPD/PD & O2 Sat Independence with exercise equipment Exercise tolerated well No report of cardiac concerns or symptoms  Goals Unmet:  Not Applicable  Comments: Pt able to follow exercise prescription today without complaint.  Will continue to monitor for progression.    Dr. Emily Filbert is Medical Director for [place] and [facility].

## 2019-12-28 ENCOUNTER — Other Ambulatory Visit: Payer: Self-pay | Admitting: Physician Assistant

## 2019-12-28 DIAGNOSIS — E119 Type 2 diabetes mellitus without complications: Secondary | ICD-10-CM

## 2019-12-28 NOTE — Telephone Encounter (Signed)
Requested Prescriptions  Pending Prescriptions Disp Refills  . JANUVIA 25 MG tablet [Pharmacy Med Name: JANUVIA 25 MG TAB] 90 tablet 0    Sig: TAKE ONE TABLET BY MOUTH [place] DAY     Endocrinology:  Diabetes - DPP-4 Inhibitors Failed - 12/28/2019  5:48 PM      Failed - Cr in normal range and within 360 days    Creatinine, Ser  Date Value Ref Range Status  11/05/2019 1.69 (H) 0.44 - 1.00 mg/dL Final         Passed - HBA1C is between 0 and 7.9 and within 180 days    Hemoglobin A1C  Date Value Ref Range Status  07/06/2019 7.3 (A) 4.0 - 5.6 % Final   Hgb A1c MFr Bld  Date Value Ref Range Status  03/28/2019 7.2 (H) 4.8 - 5.6 % Final    Comment:             Prediabetes: 5.7 - 6.4          Diabetes: >6.4          Glycemic control for adults with diabetes: <7.0          Passed - Valid encounter within last 6 months    Recent Outpatient Visits          2 months ago Chronic pulmonary embolism, unspecified pulmonary embolism type, unspecified whether acute cor pulmonale present [facility])   [place], [place], PA-C   4 months ago History of stroke   [employer], [place], PA-C   5 months ago Diabetes mellitus without complication [facility])   [place], [place], PA-C   9 months ago Congestive heart failure, unspecified HF chronicity, unspecified heart failure type [facility])   [facility] Trinna Post, [place]      Future Appointments            In 3 weeks Trinna Post, PA-C [employer], [place]   In 1 month Martyn Ehrich, NP [facility] [place]           . atorvastatin (LIPITOR) 80 MG tablet [Pharmacy Med Name: ATORVASTATIN CALCIUM 80 MG TAB] 90 tablet 2    Sig: TAKE 1 TABLET BY MOUTH DAILY     Cardiovascular:  Antilipid - Statins Failed - 12/28/2019  5:48 PM      Failed - Triglycerides in normal range and within 360 days    Triglycerides  Date Value Ref Range Status   03/28/2019 240 (H) 0 - 149 mg/dL Final         Passed - Total Cholesterol in normal range and within 360 days    Cholesterol, Total  Date Value Ref Range Status  03/28/2019 186 100 - 199 mg/dL Final         Passed - LDL in normal range and within 360 days    LDL Calculated  Date Value Ref Range Status  03/28/2019 95 0 - 99 mg/dL Final         Passed - HDL in normal range and within 360 days    HDL  Date Value Ref Range Status  03/28/2019 43 >39 mg/dL Final         Passed - Patient is not pregnant      Passed - Valid encounter within last 12 months    Recent Outpatient Visits          2 months ago Chronic pulmonary embolism, unspecified pulmonary embolism  type, unspecified whether acute cor pulmonale present [facility])   [place], [place], [place]   4 months ago History of stroke   [place], [place], PA-C   5 months ago Diabetes mellitus without complication [facility])   [place], [place], [place]   9 months ago Congestive heart failure, unspecified HF chronicity, unspecified heart failure type [facility])   [facility] Trinna Post, PA-C      Future Appointments            In 3 weeks Trinna Post, [place], [place]   In 1 month Volanda Napoleon, Ree Shay, NP [place]

## 2019-12-28 NOTE — Telephone Encounter (Signed)
Requested medication (s) are due for refill today: yes  Requested medication (s) are on the active medication list:yes  Last refill: 10/05/19  Future visit scheduled: yes  Notes to clinic:  lab Creat 1.69    Requested Prescriptions  Pending Prescriptions Disp Refills   JANUVIA 25 MG tablet [Pharmacy Med Name: JANUVIA 25 MG TAB] 90 tablet 0    Sig: [place]      Endocrinology:  Diabetes - DPP-4 Inhibitors Failed - 12/28/2019  5:48 PM      Failed - Cr in normal range and within 360 days    Creatinine, Ser  Date Value Ref Range Status  11/05/2019 1.69 (H) 0.44 - 1.00 mg/dL Final          Passed - HBA1C is between 0 and 7.9 and within 180 days    Hemoglobin A1C  Date Value Ref Range Status  07/06/2019 7.3 (A) 4.0 - 5.6 % Final   Hgb A1c MFr Bld  Date Value Ref Range Status  03/28/2019 7.2 (H) 4.8 - 5.6 % Final    Comment:             Prediabetes: 5.7 - 6.4          Diabetes: >6.4          Glycemic control for adults with diabetes: <7.0           Passed - Valid encounter within last 6 months    Recent Outpatient Visits           2 months ago Chronic pulmonary embolism, unspecified pulmonary embolism type, unspecified whether acute cor pulmonale present [facility])   [place], Adriana M, PA-C   4 months ago History of stroke   [employer], [place], PA-C   5 months ago Diabetes mellitus without complication [facility])   [place], [place], PA-C   9 months ago Congestive heart failure, unspecified HF chronicity, unspecified heart failure type [facility])   [place], Wendee Beavers, PA-C       Future Appointments             In 3 weeks Trinna Post, PA-C [employer], [place]   In 1 month Martyn Ehrich, NP [facility] [place]             Signed Prescriptions Disp Refills   atorvastatin (LIPITOR) 80 MG tablet 90 tablet 2    Sig:  TAKE 1 TABLET BY MOUTH DAILY      Cardiovascular:  Antilipid - Statins Failed - 12/28/2019  5:48 PM      Failed - Triglycerides in normal range and within 360 days    Triglycerides  Date Value Ref Range Status  03/28/2019 240 (H) 0 - 149 mg/dL Final          Passed - Total Cholesterol in normal range and within 360 days    Cholesterol, Total  Date Value Ref Range Status  03/28/2019 186 100 - 199 mg/dL Final          Passed - LDL in normal range and within 360 days    LDL Calculated  Date Value Ref Range Status  03/28/2019 95 0 - 99 mg/dL Final          Passed - HDL in normal range and within 360 days    HDL  Date Value Ref Range Status  03/28/2019 43 >39 mg/dL Final  Passed - Patient is not pregnant      Passed - Valid encounter within last 12 months    Recent Outpatient Visits           2 months ago Chronic pulmonary embolism, unspecified pulmonary embolism type, unspecified whether acute cor pulmonale present [facility])   [place], [place], PA-C   4 months ago History of stroke   [place], [place], PA-C   5 months ago Diabetes mellitus without complication [facility])   [place], [place], PA-C   9 months ago Congestive heart failure, unspecified HF chronicity, unspecified heart failure type [facility])   [facility] Trinna Post, [place]       Future Appointments             In 3 weeks Trinna Post, [place], [place]   In 1 month Martyn Ehrich, NP [place]

## 2019-12-29 ENCOUNTER — Other Ambulatory Visit: Payer: Self-pay

## 2019-12-29 ENCOUNTER — Encounter: Payer: Medicare Other | Admitting: *Deleted

## 2019-12-29 DIAGNOSIS — I272 Pulmonary hypertension, unspecified: Secondary | ICD-10-CM

## 2019-12-29 DIAGNOSIS — I270 Primary pulmonary hypertension: Secondary | ICD-10-CM | POA: Diagnosis not present

## 2019-12-29 NOTE — Progress Notes (Signed)
Daily Session Note  Patient Details  Name: Ann Flynn MRN: [id number] Date of Birth: April 17, 1941 Referring Provider:     Pulmonary Rehab from 09/22/2019 in [facility] Cardiac and Pulmonary Rehab  Referring Provider  Glori Bickers MD      Encounter Date: 12/29/2019  Check In: Session Check In - 12/29/19 1118      Check-In   Supervising physician immediately available to respond to emergencies  See telemetry face sheet for immediately available ER MD    Location  [facility]-Cardiac & Pulmonary Rehab    Staff Present  Hope Budds RDN, LDN;Jessica [place], MA, RCEP, CCRP, CCET;Amanda Sommer, BA, ACSM CEP, Exercise Physiologist;Hermelinda Diegel Sherryll Burger, RN BSN    Virtual Visit  No    Medication changes reported      No    Fall or balance concerns reported     No    Warm-up and Cool-down  Performed on first and last piece of equipment    Resistance Training Performed  Yes    VAD Patient?  No    PAD/SET Patient?  No      Pain Assessment   Currently in Pain?  No/denies          Social History   Tobacco Use  Smoking Status Never Smoker  Smokeless Tobacco Never Used    Goals Met:  Independence with exercise equipment Exercise tolerated well No report of cardiac concerns or symptoms Strength training completed today  Goals Unmet:  Not Applicable  Comments: Pt able to follow exercise prescription today without complaint.  Will continue to monitor for progression.    Dr. Emily Filbert is Medical Director for [place] and [facility].

## 2019-12-29 NOTE — Telephone Encounter (Signed)
Patient Ann Flynn was on 10/06/2019 and upcoming visit is on 01/18/2020. Medication send into the pharmacy.

## 2020-01-03 ENCOUNTER — Other Ambulatory Visit: Payer: Self-pay

## 2020-01-03 ENCOUNTER — Encounter: Payer: Medicare Other | Admitting: *Deleted

## 2020-01-03 DIAGNOSIS — I272 Pulmonary hypertension, unspecified: Secondary | ICD-10-CM

## 2020-01-03 DIAGNOSIS — I270 Primary pulmonary hypertension: Secondary | ICD-10-CM | POA: Diagnosis not present

## 2020-01-03 NOTE — Progress Notes (Signed)
Daily Session Note  Patient Details  Name: Ann Flynn MRN: [id number] Date of Birth: [birth year] Referring Provider:     Pulmonary Rehab from 09/22/2019 in [facility] Cardiac and Pulmonary Rehab  Referring Provider  Glori Bickers MD      Encounter Date: 01/03/2020  Check In: Session Check In - 01/03/20 1138      Check-In   Supervising physician immediately available to respond to emergencies  See telemetry face sheet for immediately available ER MD    Location  [facility]-Cardiac & Pulmonary Rehab    Staff Present  Heath Lark, RN, BSN, CCRP;Amanda Sommer, BA, ACSM CEP, Exercise Physiologist;Joseph Hood RCP,RRT,BSRT    Virtual Visit  No    Medication changes reported      No    Fall or balance concerns reported     No    Warm-up and Cool-down  Performed on first and last piece of equipment    Resistance Training Performed  Yes    VAD Patient?  No    PAD/SET Patient?  No      Pain Assessment   Currently in Pain?  No/denies          Social History   Tobacco Use  Smoking Status Never Smoker  Smokeless Tobacco Never Used    Goals Met:  Proper associated with RPD/PD & O2 Sat Independence with exercise equipment Exercise tolerated well No report of cardiac concerns or symptoms  Goals Unmet:  Not Applicable  Comments: Pt able to follow exercise prescription today without complaint.  Will continue to monitor for progression.    Dr. Emily Filbert is Medical Director for [place] and [facility].

## 2020-01-04 ENCOUNTER — Other Ambulatory Visit: Payer: Self-pay | Admitting: Physician Assistant

## 2020-01-04 ENCOUNTER — Encounter: Payer: Self-pay | Admitting: *Deleted

## 2020-01-04 DIAGNOSIS — I272 Pulmonary hypertension, unspecified: Secondary | ICD-10-CM

## 2020-01-04 NOTE — Progress Notes (Signed)
Pulmonary Individual Treatment Plan  Patient Details  Name: Ann Flynn MRN: [id number] Date of Birth: November 18, 1940 Referring Provider:     Pulmonary Rehab from 09/22/2019 in [facility] Cardiac and Pulmonary Rehab  Referring Provider  Glori Bickers MD      Initial Encounter Date:    Pulmonary Rehab from 09/22/2019 in [facility] Cardiac and Pulmonary Rehab  Date  09/22/19      Visit Diagnosis: Pulmonary hypertension ([place])  Patient's Home Medications on Admission:  Current Outpatient Medications:  .  furosemide (LASIX) 40 MG tablet, TAKE 1 TABLET BY MOUTH DAILY, Disp: 90 tablet, Rfl: 1 .  acetaminophen (TYLENOL) 500 MG tablet, Take 500 mg by mouth 2 (two) times daily as needed for moderate pain., Disp: , Rfl:  .  ambrisentan (LETAIRIS) 5 MG tablet, Take 1 tablet (5 mg total) by mouth daily., Disp: 90 tablet, Rfl: 1 .  amLODipine (NORVASC) 5 MG tablet, Take 1 tablet (5 mg total) by mouth daily. (Patient taking differently: Take 5 mg by mouth every evening. ), Disp: 30 tablet, Rfl: 6 .  atorvastatin (LIPITOR) 80 MG tablet, TAKE 1 TABLET BY MOUTH DAILY, Disp: 90 tablet, Rfl: 2 .  BD PEN NEEDLE NANO U/F 32G X 4 MM MISC, USE TO INJECT ONCE DAILY AS DIRECTED, Disp: 100 each, Rfl: 2 .  Cyanocobalamin (B-12 PO), Take 1 tablet by mouth daily., Disp: , Rfl:  .  docusate sodium (COLACE) 100 MG capsule, Take 100 mg by mouth at bedtime., Disp: , Rfl:  .  Dulaglutide (TRULICITY) 5.39 JQ/7.3AL SOPN, Inject 0.75 mg into the skin once a week. (Patient taking differently: Inject 0.75 mg into the skin every Monday. ), Disp: 4 pen, Rfl: 0 .  JANUVIA 25 MG tablet, TAKE ONE TABLET BY MOUTH EVERY DAY, Disp: 90 tablet, Rfl: 0 .  losartan (COZAAR) 100 MG tablet, TAKE ONE TABLET EVERY DAY, Disp: 90 tablet, Rfl: 0 .  metoprolol tartrate (LOPRESSOR) 25 MG tablet, TAKE 1 TABLET BY MOUTH TWICE DAILY, Disp: 90 tablet, Rfl: 0 .  omeprazole (PRILOSEC) 40 MG capsule, TAKE 1 CAPSULE BY MOUTH ONCE DAILY, Disp: 90 capsule, Rfl: 0 .   ONETOUCH ULTRA test strip, USE AS DIRECTED, Disp: 100 each, Rfl: 1 .  tadalafil, PAH, (ADCIRCA) 20 MG tablet, Take 2 tablets (40 mg total) by mouth daily., Disp: 180 tablet, Rfl: 1 .  TOUJEO SOLOSTAR 300 UNIT/ML SOPN, INJECT 50 UNITS INTO THE SKIN DAILY, Disp: 4.5 mL, Rfl: 2 .  venlafaxine (EFFEXOR) 75 MG tablet, TAKE ONE TABLET EVERY DAY, Disp: 90 tablet, Rfl: 0 .  VITAMIN D PO, Take 1 capsule by mouth daily., Disp: , Rfl:  .  warfarin (COUMADIN) 3 MG tablet, TAKE 1 TABLET BY MOUTH ON MONDAY WEDNESDAY AND FRIDAY ALONG WITH 4MG ON THE REMAINING DAYS, Disp: 30 tablet, Rfl: 0  Past Medical History: No past medical history on file.  Tobacco Use: Social History   Tobacco Use  Smoking Status Never Smoker  Smokeless Tobacco Never Used    Labs: Recent Review Flowsheet Data    Labs for ITP Cardiac and Pulmonary Rehab Latest Ref Rng & Units 03/28/2019 07/06/2019 07/29/2019 07/29/2019 07/29/2019   Cholestrol 100 - 199 mg/dL 186 - - - -   LDLCALC 0 - 99 mg/dL 95 - - - -   HDL >39 mg/dL 43 - - - -   Trlycerides 0 - 149 mg/dL 240(H) - - - -   Hemoglobin A1c 4.0 - 5.6 % 7.2(H) 7.3(A) - - -  HCO3 20.0 - 28.0 mmol/L - - 26.3 26.7 26.9   TCO2 22 - 32 mmol/L - - _0 O2SAT % - - 75.0 74.0 72.0       Pulmonary Assessment Scores: Pulmonary Assessment Scores    Row Name 09/22/19 1543         ADL UCSD   ADL Phase  Entry     SOB Score total  42     Rest  0     Walk  2     Stairs  5     Bath  1     Dress  1     Shop  2       CAT Score   CAT Score  22       mMRC Score   mMRC Score  2        UCSD: Self-administered rating of dyspnea associated with activities of daily living (ADLs) 6-point scale (0 = "not at all" to 5 = "maximal or unable to do because of breathlessness")  Scoring Scores range from 0 to 120.  Minimally important difference is 5 units  CAT: CAT can identify the health impairment of COPD patients and is better correlated with disease progression.  CAT has a  scoring range of zero to 40. The CAT score is classified into four groups of low (less than 10), medium (10 - 20), high (21-30) and very high (31-40) based on the impact level of disease on health status. A CAT score over 10 suggests significant symptoms.  A worsening CAT score could be explained by an exacerbation, poor medication adherence, poor inhaler technique, or progression of COPD or comorbid conditions.  CAT MCID is 2 points  mMRC: mMRC (Modified Medical Research Council) Dyspnea Scale is used to assess the degree of baseline functional disability in patients of respiratory disease due to dyspnea. No minimal important difference is established. A decrease in score of 1 point or greater is considered a positive change.   Pulmonary Function Assessment: Pulmonary Function Assessment - 09/22/19 1543      Breath   Shortness of Breath  Yes;Fear of Shortness of Breath;Limiting activity       Exercise Target Goals: Exercise Program Goal: Individual exercise prescription set using results from initial 6 min walk test and THRR while considering  patient's activity barriers and safety.   Exercise Prescription Goal: Initial exercise prescription builds to 30-45 minutes a day of aerobic activity, 2-3 days per week.  Home exercise guidelines will be given to patient during program as part of exercise prescription that the participant will acknowledge.  Education: Aerobic Exercise & Resistance Training: - Gives group verbal and written instruction on the various components of exercise. Focuses on aerobic and resistive training programs and the benefits of this training and how to safely progress through these programs..   Education: Exercise & Equipment Safety: - Individual verbal instruction and demonstration of equipment use and safety with use of the equipment.   Pulmonary Rehab from 09/22/2019 in [facility] Cardiac and Pulmonary Rehab  Date  09/22/19  Educator  [facility]  Instruction Review Code  1-  Verbalizes Understanding      Education: Exercise Physiology & General Exercise Guidelines: - Group verbal and written instruction with models to review the exercise physiology of the cardiovascular system and associated critical values. Provides general exercise guidelines with specific guidelines to those with heart or lung disease.    Education: Flexibility, Balance, Mind/Body Relaxation: Provides group verbal/written  instruction on the benefits of flexibility and balance training, including mind/body exercise modes such as yoga, pilates and tai chi.  Demonstration and skill practice provided.   Activity Barriers & Risk Stratification: Activity Barriers & Cardiac Risk Stratification - 09/21/19 0808      Activity Barriers & Cardiac Risk Stratification   Activity Barriers  Arthritis;Joint Problems;Other (comment);Deconditioning;Muscular Weakness;Shortness of Breath;Balance Concerns    Comments  Right leg/hip flaring up occassionally (possible sciattica)       6 Minute Walk: 6 Minute Walk    Row Name 09/22/19 1534         6 Minute Walk   Phase  Initial     Distance  400 feet     Walk Time  5 minutes     # of Rest Breaks  0 stopped at 5 min     MPH  0.91     METS  0.56     RPE  17     Perceived Dyspnea   3     VO2 Peak  1.95     Symptoms  Yes (comment)     Comments  dizzy, hips hurting 8/10     Resting HR  62 bpm     Resting BP  134/72     Resting Oxygen Saturation   99 %     Exercise Oxygen Saturation  during 6 min walk  86 %     Max Ex. HR  120 bpm     Max Ex. BP  152/84     2 Minute Post BP  134/74       Interval HR   1 Minute HR  95     2 Minute HR  113     3 Minute HR  117     4 Minute HR  119     5 Minute HR  120     6 Minute HR  111     2 Minute Post HR  87     Interval Heart Rate?  Yes       Interval Oxygen   Interval Oxygen?  Yes     Baseline Oxygen Saturation %  99 %     1 Minute Oxygen Saturation %  95 %     1 Minute Liters of Oxygen  3 L     2  Minute Oxygen Saturation %  94 %     2 Minute Liters of Oxygen  3 L     3 Minute Oxygen Saturation %  91 %     3 Minute Liters of Oxygen  3 L     4 Minute Oxygen Saturation %  85 %     4 Minute Liters of Oxygen  3 L     5 Minute Oxygen Saturation %  86 %     5 Minute Liters of Oxygen  3 L     6 Minute Oxygen Saturation %  90 %     6 Minute Liters of Oxygen  3 L     2 Minute Post Oxygen Saturation %  98 %     2 Minute Post Liters of Oxygen  3 L       Oxygen Initial Assessment: Oxygen Initial Assessment - 09/21/19 0811      Home Oxygen   Home Oxygen Device  Home Concentrator;E-Tanks;Portable Concentrator   just approved for portable concentrator   Sleep Oxygen Prescription  CPAP;Continuous    Liters per minute  2  11 setting   Home Exercise Oxygen Prescription  Continuous   new concentrator will be pulsed   Liters per minute  3    Home at Rest Exercise Oxygen Prescription  Continuous    Liters per minute  2.5    Compliance with Home Oxygen Use  Yes      Initial 6 min Walk   Oxygen Used  Continuous;E-Tanks   may want to test new concentrator once it arrives   Liters per minute  3      Program Oxygen Prescription   Program Oxygen Prescription  Continuous;E-Tanks    Liters per minute  3      Intervention   Short Term Goals  To learn and exhibit compliance with exercise, home and travel O2 prescription;To learn and understand importance of maintaining oxygen saturations>88%;To learn and understand importance of monitoring SPO2 with pulse oximeter and demonstrate accurate use of the pulse oximeter.;To learn and demonstrate proper pursed lip breathing techniques or other breathing techniques.;To learn and demonstrate proper use of respiratory medications   has pulse oximeter   Long  Term Goals  Exhibits compliance with exercise, home and travel O2 prescription;Verbalizes importance of monitoring SPO2 with pulse oximeter and return demonstration;Maintenance of O2  saturations>88%;Exhibits proper breathing techniques, such as pursed lip breathing or other method taught during program session;Compliance with respiratory medication;Demonstrates proper use of MDI's       Oxygen Re-Evaluation: Oxygen Re-Evaluation    Row Name 09/27/19 1123 11/15/19 1122           Program Oxygen Prescription   Program Oxygen Prescription  Continuous;E-Tanks  Continuous;E-Tanks      Liters per minute  3  3        Home Oxygen   Home Oxygen Device  Home Concentrator;E-Tanks;Portable Concentrator  Home Concentrator;E-Tanks;Portable Concentrator      Sleep Oxygen Prescription  CPAP;Continuous  CPAP;Continuous      Liters per minute  2  2      Home Exercise Oxygen Prescription  --  Continuous      Liters per minute  3  3      Home at Rest Exercise Oxygen Prescription  Continuous  Continuous      Liters per minute  2.5  2.5      Compliance with Home Oxygen Use  Yes  Yes        Goals/Expected Outcomes   Short Term Goals  To learn and exhibit compliance with exercise, home and travel O2 prescription;To learn and understand importance of maintaining oxygen saturations>88%;To learn and understand importance of monitoring SPO2 with pulse oximeter and demonstrate accurate use of the pulse oximeter.;To learn and demonstrate proper pursed lip breathing techniques or other breathing techniques.;To learn and demonstrate proper use of respiratory medications  To learn and exhibit compliance with exercise, home and travel O2 prescription;To learn and understand importance of maintaining oxygen saturations>88%;To learn and understand importance of monitoring SPO2 with pulse oximeter and demonstrate accurate use of the pulse oximeter.;To learn and demonstrate proper pursed lip breathing techniques or other breathing techniques.;To learn and demonstrate proper use of respiratory medications      Long  Term Goals  Exhibits compliance with exercise, home and travel O2 prescription;Verbalizes  importance of monitoring SPO2 with pulse oximeter and return demonstration;Maintenance of O2 saturations>88%;Exhibits proper breathing techniques, such as pursed lip breathing or other method taught during program session;Compliance with respiratory medication;Demonstrates proper use of MDI's  Exhibits compliance with exercise, home and travel O2 prescription;Verbalizes importance of  monitoring SPO2 with pulse oximeter and return demonstration;Maintenance of O2 saturations>88%;Exhibits proper breathing techniques, such as pursed lip breathing or other method taught during program session;Compliance with respiratory medication;Demonstrates proper use of MDI's      Comments  Reviewed PLB technique with pt.  Talked about how it works and it's importance in maintaining their exercise saturations.  Inesha has been compliant with her oxygen at home.  She will sit and use her PLB to help get better control of her breathing when she feels that it is getting away from her.      Goals/Expected Outcomes  Short: Become more profiecient at using PLB.   Long: Become independent at using PLB.  Short: Continue to use PLB to help with breathing.  Long: Continued compliance. h         Oxygen Discharge (Final Oxygen Re-Evaluation): Oxygen Re-Evaluation - 11/15/19 1122      Program Oxygen Prescription   Program Oxygen Prescription  Continuous;E-Tanks    Liters per minute  3      Home Oxygen   Home Oxygen Device  Home Concentrator;E-Tanks;Portable Concentrator    Sleep Oxygen Prescription  CPAP;Continuous    Liters per minute  2    Home Exercise Oxygen Prescription  Continuous    Liters per minute  3    Home at Rest Exercise Oxygen Prescription  Continuous    Liters per minute  2.5    Compliance with Home Oxygen Use  Yes      Goals/Expected Outcomes   Short Term Goals  To learn and exhibit compliance with exercise, home and travel O2 prescription;To learn and understand importance of maintaining oxygen  saturations>88%;To learn and understand importance of monitoring SPO2 with pulse oximeter and demonstrate accurate use of the pulse oximeter.;To learn and demonstrate proper pursed lip breathing techniques or other breathing techniques.;To learn and demonstrate proper use of respiratory medications    Long  Term Goals  Exhibits compliance with exercise, home and travel O2 prescription;Verbalizes importance of monitoring SPO2 with pulse oximeter and return demonstration;Maintenance of O2 saturations>88%;Exhibits proper breathing techniques, such as pursed lip breathing or other method taught during program session;Compliance with respiratory medication;Demonstrates proper use of MDI's    Comments  Jatara has been compliant with her oxygen at home.  She will sit and use her PLB to help get better control of her breathing when she feels that it is getting away from her.    Goals/Expected Outcomes  Short: Continue to use PLB to help with breathing.  Long: Continued compliance. h       Initial Exercise Prescription: Initial Exercise Prescription - 09/22/19 1500      Date of Initial Exercise RX and Referring Provider   Date  09/22/19    Referring Provider  Glori Bickers MD      Oxygen   Oxygen  Continuous    Liters  3      Treadmill   MPH  0.8    Grade  0    Minutes  15    METs  1.6      NuStep   Level  1    SPM  80    Minutes  15    METs  1.5      T5 Nustep   Level  1    SPM  80    Minutes  15    METs  1.5      Biostep-RELP   Level  1    SPM  50  Minutes  15    METs  1      Prescription Details   Frequency (times per week)  2    Duration  Progress to 30 minutes of continuous aerobic without signs/symptoms of physical distress      Intensity   THRR 40-80% of Max Heartrate  94-126    Ratings of Perceived Exertion  11-13    Perceived Dyspnea  0-4      Progression   Progression  Continue to progress workloads to maintain intensity without signs/symptoms of physical  distress.      Resistance Training   Training Prescription  Yes    Weight  3 lb    Reps  10-15       Perform Capillary Blood Glucose checks as needed.  Exercise Prescription Changes: Exercise Prescription Changes    Row Name 09/22/19 1500 10/04/19 1100 10/19/19 0900 11/01/19 1100 11/14/19 1300     Response to Exercise   Blood Pressure (Admit)  134/72  158/80  114/66  118/82  144/74   Blood Pressure (Exercise)  152/84  156/84  122/74  130/64  124/64   Blood Pressure (Exit)  126/72  126/64  102/60  106/62  104/64   Heart Rate (Admit)  62 bpm  78 bpm  85 bpm  80 bpm  80 bpm   Heart Rate (Exercise)  120 bpm  84 bpm  98 bpm  96 bpm  105 bpm   Heart Rate (Exit)  64 bpm  67 bpm  62 bpm  61 bpm  84 bpm   Oxygen Saturation (Admit)  99 %  97 %  90 %  94 %  96 %   Oxygen Saturation (Exercise)  85 %  95 %  93 %  97 %  93 %   Oxygen Saturation (Exit)  97 %  95 %  98 %  98 %  94 %   Rating of Perceived Exertion (Exercise)  _0 Perceived Dyspnea (Exercise)  _1 Symptoms  dizzy, hip hurting 8/10  --  fatigue  --  none   Comments  walk test results  first day  --  --  --   Duration  --  --  Continue with 30 min of aerobic exercise without signs/symptoms of physical distress.  Continue with 30 min of aerobic exercise without signs/symptoms of physical distress.  Continue with 30 min of aerobic exercise without signs/symptoms of physical distress.   Intensity  --  --  THRR unchanged  THRR unchanged  THRR unchanged     Progression   Progression  --  --  Continue to progress workloads to maintain intensity without signs/symptoms of physical distress.  Continue to progress workloads to maintain intensity without signs/symptoms of physical distress.  Continue to progress workloads to maintain intensity without signs/symptoms of physical distress.   Average METs  --  --  1.5  1.5  1.8     Resistance Training   Training Prescription  --  --  Yes  Yes  Yes   Weight  --  --  3  lb   3 lb   3 lb   Reps  --  --  10-15  10-15  10-15     Interval Training   Interval Training  --  --  No  No  No     Treadmill   MPH  --  --  0.8  --  0.8   Grade  --  --  0  --  0   Minutes  --  --  15  --  15   METs  --  --  1.61  --  1.6     NuStep   Level  --  --  _0 SPM  --  --  --  80  --   Minutes  --  --  _1 METs  --  --  1.6  2  1.8     T5 Nustep   Level  --  --  1  --  1   Minutes  --  --  15  --  15   METs  --  --  1.8  --  1.8     Biostep-RELP   Level  --  --  _2 SPM  --  --  --  50  --   Minutes  --  --  _3 METs  --  --  _4 Row Name 11/15/19 1100 11/29/19 1300 12/12/19 1500 12/20/19 1500 01/02/20 1600     Response to Exercise   Blood Pressure (Admit)  --  114/62  128/78  120/60  120/70   Blood Pressure (Exercise)  --  118/64  122/68  132/58  110/70   Blood Pressure (Exit)  --  130/82  118/64  128/70  112/66   Heart Rate (Admit)  --  85 bpm  104 bpm  60 bpm  69 bpm   Heart Rate (Exercise)  --  102 bpm  112 bpm  104 bpm  85 bpm   Heart Rate (Exit)  --  86 bpm  61 bpm  68 bpm  67 bpm   Oxygen Saturation (Admit)  --  90 %  90 %  91 %  93 %   Oxygen Saturation (Exercise)  --  92 %  92 %  91 %  95 %   Oxygen Saturation (Exit)  --  88 %  93 %  95 %  99 %   Rating of Perceived Exertion (Exercise)  --  _5 Perceived Dyspnea (Exercise)  --  _6 Symptoms  --  none  none  none  none   Duration  --  Continue with 30 min of aerobic exercise without signs/symptoms of physical distress.  Continue with 30 min of aerobic exercise without signs/symptoms of physical distress.  Continue with 30 min of aerobic exercise without signs/symptoms of physical distress.  Continue with 30 min of aerobic exercise without signs/symptoms of physical distress.   Intensity  --  THRR unchanged  THRR unchanged  THRR unchanged  THRR unchanged     Progression   Progression  --  Continue to progress workloads to maintain intensity  without signs/symptoms of physical distress.  Continue to progress workloads to maintain intensity without signs/symptoms of physical distress.  Continue to progress workloads to maintain intensity without signs/symptoms of physical distress.  Continue to progress workloads to maintain intensity without signs/symptoms of physical distress.   Average METs  --  2.25  1.73  2  1.8     Resistance Training   Training Prescription  --  Yes  Yes  Yes  Yes  Weight  --  3 lb  3 lb  3 lb  3 lb   Reps  --  10-15  10-15  10-15  10-15     Interval Training   Interval Training  --  No  No  No  No     Treadmill   MPH  --  --  0.8  --  0.8   Grade  --  --  0  --  0   Minutes  --  --  15  --  15   METs  --  --  1.6  --  1.6     NuStep   Level  --  --  _0 SPM  --  --  --  80  --   Minutes  --  --  _1 METs  --  --  1.5  2.1  1.8     T5 Nustep   Level  --  4  1  --  5   SPM  --  80  --  --  --   Minutes  --  15  15  --  15   METs  --  2.5  1.8  --  1.8     Biostep-RELP   Level  --  _2 SPM  --  50  --  50  --   Minutes  --  _3 METs  --  _4 Home Exercise Plan   Plans to continue exercise at  Home (comment) walking, staff videos  Home (comment) walking, staff videos  Home (comment) walking, staff videos  Home (comment) walking, staff videos  Home (comment) walking, staff videos   Frequency  Add 2 additional days to program exercise sessions.  Add 2 additional days to program exercise sessions.  Add 2 additional days to program exercise sessions.  Add 2 additional days to program exercise sessions.  Add 2 additional days to program exercise sessions.   Initial Home Exercises Provided  11/15/19  11/15/19  11/15/19  11/15/19  11/15/19      Exercise Comments: Exercise Comments    Row Name 10/25/19 1240           Exercise Comments  Pulse oximeter not picking up HR. Placed on monitor to determine HR and rhythm. Found to be in A Fib. Note sent  to referring physician. Patient is not aware of ever being in A fib. She is on Warfarin daily.          Exercise Goals and Review: Exercise Goals    Row Name 09/22/19 1540             Exercise Goals   Increase Physical Activity  Yes       Intervention  Provide advice, education, support and counseling about physical activity/exercise needs.;Develop an individualized exercise prescription for aerobic and resistive training based on initial evaluation findings, risk stratification, comorbidities and participant's personal goals.       Expected Outcomes  Short Term: Attend rehab on a regular basis to increase amount of physical activity.;Long Term: Add in home exercise to make exercise part of routine and to increase amount of physical activity.;Long Term: Exercising regularly at least 3-5 days a week.       Increase Strength and Stamina  Yes  Intervention  Provide advice, education, support and counseling about physical activity/exercise needs.;Develop an individualized exercise prescription for aerobic and resistive training based on initial evaluation findings, risk stratification, comorbidities and participant's personal goals.       Expected Outcomes  Short Term: Increase workloads from initial exercise prescription for resistance, speed, and METs.;Short Term: Perform resistance training exercises routinely during rehab and add in resistance training at home;Long Term: Improve cardiorespiratory fitness, muscular endurance and strength as measured by increased METs and functional capacity (6MWT)       Able to understand and use rate of perceived exertion (RPE) scale  Yes       Intervention  Provide education and explanation on how to use RPE scale       Expected Outcomes  Short Term: Able to use RPE daily in rehab to express subjective intensity level;Long Term:  Able to use RPE to guide intensity level when exercising independently       Able to understand and use Dyspnea scale  Yes        Intervention  Provide education and explanation on how to use Dyspnea scale       Expected Outcomes  Short Term: Able to use Dyspnea scale daily in rehab to express subjective sense of shortness of breath during exertion;Long Term: Able to use Dyspnea scale to guide intensity level when exercising independently       Knowledge and understanding of Target Heart Rate Range (THRR)  Yes       Intervention  Provide education and explanation of THRR including how the numbers were predicted and where they are located for reference       Expected Outcomes  Short Term: Able to state/look up THRR;Short Term: Able to use daily as guideline for intensity in rehab;Long Term: Able to use THRR to govern intensity when exercising independently       Able to check pulse independently  Yes       Intervention  Provide education and demonstration on how to check pulse in carotid and radial arteries.;Review the importance of being able to check your own pulse for safety during independent exercise       Expected Outcomes  Short Term: Able to explain why pulse checking is important during independent exercise;Long Term: Able to check pulse independently and accurately       Understanding of Exercise Prescription  Yes       Intervention  Provide education, explanation, and written materials on patient's individual exercise prescription       Expected Outcomes  Short Term: Able to explain program exercise prescription;Long Term: Able to explain home exercise prescription to exercise independently          Exercise Goals Re-Evaluation : Exercise Goals Re-Evaluation    [place] Name 09/27/19 1122 10/19/19 0942 11/01/19 1132 11/14/19 1339 11/15/19 1117     Exercise Goal Re-Evaluation   Exercise Goals Review  Increase Physical Activity;Able to understand and use rate of perceived exertion (RPE) scale;Knowledge and understanding of Target Heart Rate Range (THRR);Understanding of Exercise Prescription;Increase Strength and  Stamina;Able to understand and use Dyspnea scale;Able to check pulse independently  Increase Physical Activity;Increase Strength and Stamina;Understanding of Exercise Prescription  Increase Physical Activity;Increase Strength and Stamina;Able to understand and use rate of perceived exertion (RPE) scale;Able to understand and use Dyspnea scale;Knowledge and understanding of Target Heart Rate Range (THRR);Able to check pulse independently;Understanding of Exercise Prescription  Increase Physical Activity;Increase Strength and Stamina;Understanding of Exercise Prescription  Increase Physical Activity;Increase  Strength and Stamina;Understanding of Exercise Prescription   Comments  Reviewed RPE scale, THR and program prescription with pt today.  Pt voiced understanding and was given a copy of goals to take home.  Edwin is off to a good start in rehab. She is already up to the full 15 min on the treadmill. We will continue to monitor her progress.  Ambera has increased from .6-.8 on TM.  Staff will monitor progress.  Harlean is doing well in rehab.  She is now up to 2 METs on the BioStep.  We will encourage her to increase workloads and continue to monitor her progress.  Aja is doing well in rehab.  She over did it some trying to get ready for company, but she was able.  She is doing some exericse at home.  Reviewed home exercise with pt today.  Pt plans to walk and use videos at home for exercise.  Reviewed THR, pulse, RPE, sign and symptoms, and when to call 911 or MD.  Also discussed weather considerations and indoor options.  Pt voiced understanding.   Expected Outcomes  Short: Use RPE daily to regulate intensity. Long: Follow program prescription in THR.  Short: Continue to attend rehab regularly.  Long: Continue to follow program prescription.  Short:  attend class consistently Long : improve overall stamina  Short: Increase workloads.  Long: Continue to improve stamina.  Short: Start to add in more exercise at  home.  Long: Continue to improve stamina.   [place] Name 11/29/19 1332 12/12/19 1539 12/20/19 1554 12/27/19 1132 01/02/20 1616     Exercise Goal Re-Evaluation   Exercise Goals Review  Increase Physical Activity;Increase Strength and Stamina;Able to understand and use rate of perceived exertion (RPE) scale;Able to understand and use Dyspnea scale;Knowledge and understanding of Target Heart Rate Range (THRR);Able to check pulse independently;Understanding of Exercise Prescription  Increase Physical Activity;Increase Strength and Stamina;Understanding of Exercise Prescription  Increase Physical Activity;Increase Strength and Stamina;Able to understand and use rate of perceived exertion (RPE) scale;Able to understand and use Dyspnea scale;Knowledge and understanding of Target Heart Rate Range (THRR);Able to check pulse independently;Understanding of Exercise Prescription  Increase Physical Activity;Increase Strength and Stamina;Able to understand and use rate of perceived exertion (RPE) scale;Able to understand and use Dyspnea scale;Knowledge and understanding of Target Heart Rate Range (THRR);Able to check pulse independently;Understanding of Exercise Prescription  Increase Physical Activity;Increase Strength and Stamina;Understanding of Exercise Prescription   Comments  Tuwanda has attended consistently and is up to level 4 on NS.  She works in correct THR and RPE range.  Abbigale is doing well in rehab.  She is doing her whole 15-20 min on the treadmill now.  We will move up her workload on the T5 NuStep.  We will continue to monitor her progress.  Kieana missed some sessioins this month.  She will see more progress with more consistent attendance.  Chiamaka walks at home inside her house.  She plans to try staff videos.  Alexiya has been doing well in rehab.  She is now up to level 5 on the NuStep.  We will continue to monitor her progress.   Expected Outcomes  Short:  increase level on Biostep Long:  improve overall stamina   Short: Increase workloads  Long: Continue to improve stamina.  Short - attend/exercsie at home 3 days per week Long:  improve stamina/MET level  Short: add in videos for exercise at least one other day Long: improve stamina  Short: Increase SPM on both  NuStep and BioStep for higher METs  Long: Continue to improve stamina.      Discharge Exercise Prescription (Final Exercise Prescription Changes): Exercise Prescription Changes - 01/02/20 1600      Response to Exercise   Blood Pressure (Admit)  120/70    Blood Pressure (Exercise)  110/70    Blood Pressure (Exit)  112/66    Heart Rate (Admit)  69 bpm    Heart Rate (Exercise)  85 bpm    Heart Rate (Exit)  67 bpm    Oxygen Saturation (Admit)  93 %    Oxygen Saturation (Exercise)  95 %    Oxygen Saturation (Exit)  99 %    Rating of Perceived Exertion (Exercise)  13    Perceived Dyspnea (Exercise)  2    Symptoms  none    Duration  Continue with 30 min of aerobic exercise without signs/symptoms of physical distress.    Intensity  THRR unchanged      Progression   Progression  Continue to progress workloads to maintain intensity without signs/symptoms of physical distress.    Average METs  1.8      Resistance Training   Training Prescription  Yes    Weight  3 lb    Reps  10-15      Interval Training   Interval Training  No      Treadmill   MPH  0.8    Grade  0    Minutes  15    METs  1.6      NuStep   Level  5    Minutes  15    METs  1.8      T5 Nustep   Level  5    Minutes  15    METs  1.8      Biostep-RELP   Level  1    Minutes  15    METs  2      Home Exercise Plan   Plans to continue exercise at  Home (comment)   walking, staff videos   Frequency  Add 2 additional days to program exercise sessions.    Initial Home Exercises Provided  11/15/19       Nutrition:  Target Goals: Understanding of nutrition guidelines, daily intake of sodium <1558m, cholesterol <2057m calories 30% from fat and 7% or less from  saturated fats, daily to have 5 or more servings of fruits and vegetables.  Education: Controlling Sodium/Reading Food Labels -Group verbal and written material supporting the discussion of sodium use in heart healthy nutrition. Review and explanation with models, verbal and written materials for utilization of the food label.   Education: General Nutrition Guidelines/Fats and Fiber: -Group instruction provided by verbal, written material, models and posters to present the general guidelines for heart healthy nutrition. Gives an explanation and review of dietary fats and fiber.   Biometrics: Pre Biometrics - 09/22/19 1541      Pre Biometrics   Height  5' 6.5" (1.689 m)    Weight  250 lb 12.8 oz (113.8 kg)    BMI (Calculated)  39.88    Single Leg Stand  1.53 seconds        Nutrition Therapy Plan and Nutrition Goals: Nutrition Therapy & Goals - 09/26/19 1503      Nutrition Therapy   Diet  HH diet    Drug/Food Interactions  Statins/Certain Fruits;Coumadin/Vit K    Protein (specify units)  125.18g    Fiber  25 grams  Whole Grain Foods  3 servings    Saturated Fats  12 max. grams    Fruits and Vegetables  5 servings/day    Sodium  1.5 grams      Personal Nutrition Goals   Nutrition Goal  ST: include more vegetables in an easier manner LT: able to get around better, would like to increase balance, MD said to lose weight.    Comments  Pt reports able to do ADL. Toast with peanut butter (wheat, unless its expensive) with 2 cups of coffee (black)and  Juice glass of cranberry juice (mostly water). Pimento cheese cracker or banana and peanut butter, cereal, yogurt.  Meatloaf and baked potato. Pt reports not eating many vegetables. Snacks on fruit. Pt on long lasting insulin; checks BG every morning - takes insulin at night. Discussed HH eating, set-point weight, pulmonary MNT. Lipitor (statin),Colace,Lasix,T2DM: januvia, Insulin Glargine (long lasting),Coumadin,Vitamin D, CKD stg 4, CHF,  Pulmonary hypertension      Intervention Plan   Intervention  Nutrition handout(s) given to patient.    Expected Outcomes  Short Term Goal: Understand basic principles of dietary content, such as calories, fat, sodium, cholesterol and nutrients.;Short Term Goal: A plan has been developed with personal nutrition goals set during dietitian appointment.;Long Term Goal: Adherence to prescribed nutrition plan.       Nutrition Assessments: Nutrition Assessments - 09/22/19 1542      MEDFICTS Scores   Pre Score  56       MEDIFICTS Score Key:          ?70 Need to make dietary changes          40-70 Heart Healthy Diet         ? 40 Therapeutic Level Cholesterol Diet  Nutrition Goals Re-Evaluation: Nutrition Goals Re-Evaluation    Luis M. Cintron Name 10/18/19 1126 11/24/19 1130 12/27/19 1139         Goals   Nutrition Goal  ST: include more vegetables in an easier manner LT: able to get around better, would like to increase balance, MD said to lose weight.  ST: include more vegetables in an easier manner LT: able to get around better, would like to increase balance, MD said to lose weight.  ST: include more vegetables in an easier manner LT: able to get around better, would like to increase balance, MD said to lose weight.     Comment  Pt reports including more fruits and vegetables and is doing well incorporating them into her meals. discussed consistent eating and BG.  Pt reports being able to include more F/V to diet, carrots, cucumbers, tomatoes, bananas, and cuties. Gave pt some ideas like tzasiki dip and gave handouts.  Pt reports getting an airfyer and has started using it.     Expected Outcome  ST: include more vegetables in an easier manner LT: able to get around better, would like to increase balance, MD said to lose weight.  ST: include more vegetables in an easier manner LT: able to get around better, would like to increase balance, MD said to lose weight.  ST: include more vegetables in an easier  manner LT: able to get around better, would like to increase balance, MD said to lose weight.        Nutrition Goals Discharge (Final Nutrition Goals Re-Evaluation): Nutrition Goals Re-Evaluation - 12/27/19 1139      Goals   Nutrition Goal  ST: include more vegetables in an easier manner LT: able to get around better, would like to  increase balance, MD said to lose weight.    Comment  Pt reports getting an airfyer and has started using it.    Expected Outcome  ST: include more vegetables in an easier manner LT: able to get around better, would like to increase balance, MD said to lose weight.       Psychosocial: Target Goals: Acknowledge presence or absence of significant depression and/or stress, maximize coping skills, provide positive support system. Participant is able to verbalize types and ability to use techniques and skills needed for reducing stress and depression.   Education: Depression - Provides group verbal and written instruction on the correlation between heart/lung disease and depressed mood, treatment options, and the stigmas associated with seeking treatment.   Education: Sleep Hygiene -Provides group verbal and written instruction about how sleep can affect your health.  Define sleep hygiene, discuss sleep cycles and impact of sleep habits. Review good sleep hygiene tips.    Education: Stress and Anxiety: - Provides group verbal and written instruction about the health risks of elevated stress and causes of high stress.  Discuss the correlation between heart/lung disease and anxiety and treatment options. Review healthy ways to manage with stress and anxiety.   Initial Review & Psychosocial Screening: Initial Psych Review & Screening - 09/21/19 0817      Initial Review   Current issues with  Current Depression;History of Depression;Current Psychotropic Meds;Current Stress Concerns    Source of Stress Concerns  Chronic Illness;Family;Retirement/disability     Comments  History of depression from loss of husband six years ago and started taking Elavil and she feels like her meds are working.  She recently moved down here about 7 months ago and the pandemic has made it hard to get acquainted.  She has two kids, daughter in [place] and son in [place], [place].      Family Dynamics   Good Support System?  Yes   Son and daughter are support system and live near by, hard to make new friends   Comments  Lost husband six years ago and just moved here 7 months ago      Barriers   Psychosocial barriers to participate in program  The patient should benefit from training in stress management and relaxation.;Psychosocial barriers identified (see note)      Screening Interventions   Interventions  Encouraged to exercise;Program counselor consult;To provide support and resources with identified psychosocial needs;Provide feedback about the scores to participant    Expected Outcomes  Short Term goal: Utilizing psychosocial counselor, staff and physician to assist with identification of specific Stressors or current issues interfering with healing process. Setting desired goal for each stressor or current issue identified.;Long Term Goal: Stressors or current issues are controlled or eliminated.;Short Term goal: Identification and review with participant of any Quality of Life or Depression concerns found by scoring the questionnaire.;Long Term goal: The participant improves quality of Life and PHQ9 Scores as seen by post scores and/or verbalization of changes       Quality of Life Scores:  Scores of 19 and below usually indicate a poorer quality of life in these areas.  A difference of  2-3 points is a clinically meaningful difference.  A difference of 2-3 points in the total score of the Quality of Life Index has been associated with significant improvement in overall quality of life, self-image, physical symptoms, and general health in studies assessing change in  quality of life.  PHQ-9: Recent Review Flowsheet Data    Depression  screen [facility] 2/9 10/11/2019 09/22/2019 03/28/2019   Decreased Interest 1 1 0   Down, Depressed, Hopeless _0 PHQ - 2 Score _1 Altered sleeping 1 2 -   Tired, decreased energy 1 3 -   Change in appetite 1 2 -   Feeling bad or failure about yourself  1 2 -   Trouble concentrating 0 0 -   Moving slowly or fidgety/restless 0 0 -   Suicidal thoughts 0 0 -   PHQ-9 Score 6 11 -   Difficult doing work/chores Somewhat difficult Somewhat difficult -     Interpretation of Total Score  Total Score Depression Severity:  1-4 = Minimal depression, 5-9 = Mild depression, 10-14 = Moderate depression, 15-19 = Moderately severe depression, 20-27 = Severe depression   Psychosocial Evaluation and Intervention: Psychosocial Evaluation - 09/21/19 0831      Psychosocial Evaluation & Interventions   Interventions  Encouraged to exercise with the program and follow exercise prescription    Comments  Ms. Perdomo is coming into Pulmonary Rehab with history of pulmonary hypertension, ILD, and heart failure.  She moved to the area seven months ago and has found it hard to settle in and make friends during the pandemic.  Her kids live close by (daughter in [place] and son in [place], [place]).  She lost her husband six years ago and decided it was time to be closer to her kids. She really wants to get into a routine and start moving more as the pandemic has turned her into a couch potato.  She is currently needing to be wheeled around at the hospital but we are going to work with her to be able to walk around on her own by building strength and stamina.  Coming to class will also offer her a social outlet and chance to meet some people in the area.  We look forward to working with her.    Expected Outcomes  Short: Build up strength and stamina for improved mobility.  Long: Make adjustments to living in the area.    Continue Psychosocial Services    Follow up required by staff       Psychosocial Re-Evaluation: Psychosocial Re-Evaluation    [place] Name 10/27/19 1136 11/15/19 1118 12/27/19 1127         Psychosocial Re-Evaluation   Current issues with  Current Stress Concerns;Current Sleep Concerns  Current Stress Concerns  Current Stress Concerns     Comments  Shylyn moved here from [place] 8 months ago.  She hasnt been able to meet many people yet.  She does have family here - daughter and son in law.  She states she sleeps pretty well - around 6 hours a night.  Odessia is doing well in rehab. She is feeling good overall and mentally.  She has met a few more people coming to class, but hard to talk while exercising.  She enjoys being with people.  She continues to sleep well.  Borghild is still feeling good overall and was able to visit family over the weekend.  She enjoys playing cards and will check on local [facility] Centers that offer activities.     Expected Outcomes  Short: continue to attend [facility] for the exercise Long:  meet more people locally  Short: Continue to try to get out to meet people  Long: Continue to stay positive.  Short: attend LW for exercise and get out to meet others Long:  maintain positive  outlook     Interventions  Encouraged to attend Pulmonary Rehabilitation for the exercise  Encouraged to attend Pulmonary Rehabilitation for the exercise  --     Continue Psychosocial Services   --  Follow up required by staff  --        Psychosocial Discharge (Final Psychosocial Re-Evaluation): Psychosocial Re-Evaluation - 12/27/19 1127      Psychosocial Re-Evaluation   Current issues with  Current Stress Concerns    Comments  Kaylor is still feeling good overall and was able to visit family over the weekend.  She enjoys playing cards and will check on local Senior Centers that offer activities.    Expected Outcomes  Short: attend LW for exercise and get out to meet others Long:  maintain positive outlook       Education: Education  Goals: Education classes will be provided on a weekly basis, covering required topics. Participant will state understanding/return demonstration of topics presented.  Learning Barriers/Preferences: Learning Barriers/Preferences - 09/21/19 2595      Learning Barriers/Preferences   Learning Barriers  Sight   glasses   Learning Preferences  None       General Pulmonary Education Topics:  Infection Prevention: - Provides verbal and written material to individual with discussion of infection control including proper hand washing and proper equipment cleaning during exercise session.   Pulmonary Rehab from 09/22/2019 in [facility] Cardiac and Pulmonary Rehab  Date  09/22/19  Educator  [facility]  Instruction Review Code  1- Verbalizes Understanding      Falls Prevention: - Provides verbal and written material to individual with discussion of falls prevention and safety.   Pulmonary Rehab from 09/22/2019 in [facility] Cardiac and Pulmonary Rehab  Date  09/22/19  Educator  [facility]  Instruction Review Code  1- Verbalizes Understanding      Chronic Lung Diseases: - Group verbal and written instruction to review updates, respiratory medications, advancements in procedures and treatments. Discuss use of supplemental oxygen including available portable oxygen systems, continuous and intermittent flow rates, concentrators, personal use and safety guidelines. Review proper use of inhaler and spacers. Provide informative websites for self-education.    Energy Conservation: - Provide group verbal and written instruction for methods to conserve energy, plan and organize activities. Instruct on pacing techniques, use of adaptive equipment and posture/positioning to relieve shortness of breath.   Triggers and Exacerbations: - Group verbal and written instruction to review types of environmental triggers and ways to prevent exacerbations. Discuss weather changes, air quality and the benefits of nasal washing. Review warning  signs and symptoms to help prevent infections. Discuss techniques for effective airway clearance, coughing, and vibrations.   AED/CPR: - Group verbal and written instruction with the use of models to demonstrate the basic use of the AED with the basic ABC's of resuscitation.   Anatomy and Physiology of the Lungs: - Group verbal and written instruction with the use of models to provide basic lung anatomy and physiology related to function, structure and complications of lung disease.   Anatomy & Physiology of the Heart: - Group verbal and written instruction and models provide basic cardiac anatomy and physiology, with the coronary electrical and arterial systems. Review of Valvular disease and Heart Failure   Cardiac Medications: - Group verbal and written instruction to review commonly prescribed medications for heart disease. Reviews the medication, class of the drug, and side effects.   Other: -Provides group and verbal instruction on various topics (see comments)   Knowledge Questionnaire Score: Knowledge  Questionnaire Score - 09/22/19 1542      Knowledge Questionnaire Score   Pre Score  18/18        Core Components/Risk Factors/Patient Goals at Admission: Personal Goals and Risk Factors at Admission - 09/21/19 0856      Core Components/Risk Factors/Patient Goals on Admission    Weight Management  Yes;Weight Loss    Intervention  Weight Management: Develop a combined nutrition and exercise program designed to reach desired caloric intake, while maintaining appropriate intake of nutrient and fiber, sodium and fats, and appropriate energy expenditure required for the weight goal.;Weight Management: Provide education and appropriate resources to help participant work on and attain dietary goals.    Expected Outcomes  Short Term: Continue to assess and modify interventions until short term weight is achieved;Long Term: Adherence to nutrition and physical activity/exercise program  aimed toward attainment of established weight goal;Weight Loss: Understanding of general recommendations for a balanced deficit meal plan, which promotes 1-2 lb weight loss per week and includes a negative energy balance of [id number] kcal/d;Understanding recommendations for meals to include 15-35% energy as protein, 25-35% energy from fat, 35-60% energy from carbohydrates, less than 254m of dietary cholesterol, 20-35 gm of total fiber daily;Understanding of distribution of calorie intake throughout the day with the consumption of 4-5 meals/snacks    Diabetes  Yes    Intervention  Provide education about signs/symptoms and action to take for hypo/hyperglycemia.;Provide education about proper nutrition, including hydration, and aerobic/resistive exercise prescription along with prescribed medications to achieve blood glucose in normal ranges: Fasting glucose 65-99 mg/dL    Expected Outcomes  Short Term: Participant verbalizes understanding of the signs/symptoms and immediate care of hyper/hypoglycemia, proper foot care and importance of medication, aerobic/resistive exercise and nutrition plan for blood glucose control.;Long Term: Attainment of HbA1C < 7%.    Heart Failure  Yes    Intervention  Provide a combined exercise and nutrition program that is supplemented with education, support and counseling about heart failure. Directed toward relieving symptoms such as shortness of breath, decreased exercise tolerance, and extremity edema.    Expected Outcomes  Improve functional capacity of life;Short term: Attendance in program 2-3 days a week with increased exercise capacity. Reported lower sodium intake. Reported increased fruit and vegetable intake. Reports medication compliance.;Short term: Daily weights obtained and reported for increase. Utilizing diuretic protocols set by physician.;Long term: Adoption of self-care skills and reduction of barriers for early signs and symptoms recognition and intervention  leading to self-care maintenance.    Hypertension  Yes    Intervention  Provide education on lifestyle modifcations including regular physical activity/exercise, weight management, moderate sodium restriction and increased consumption of fresh fruit, vegetables, and low fat dairy, alcohol moderation, and smoking cessation.;Monitor prescription use compliance.    Expected Outcomes  Short Term: Continued assessment and intervention until BP is < 140/936mHG in hypertensive participants. < 130/8048mG in hypertensive participants with diabetes, heart failure or chronic kidney disease.;Long Term: Maintenance of blood pressure at goal levels.    Lipids  Yes    Intervention  Provide education and support for participant on nutrition & aerobic/resistive exercise along with prescribed medications to achieve LDL <84m60mDL >40mg46m Expected Outcomes  Short Term: Participant states understanding of desired cholesterol values and is compliant with medications prescribed. Participant is following exercise prescription and nutrition guidelines.;Long Term: Cholesterol controlled with medications as prescribed, with individualized exercise RX and with personalized nutrition plan. Value goals: LDL < 84mg,13m > 40 mg.  Education:Diabetes - Individual verbal and written instruction to review signs/symptoms of diabetes, desired ranges of glucose level fasting, after meals and with exercise. Acknowledge that pre and post exercise glucose checks will be done for 3 sessions at entry of program.   Education: Know Your Numbers and Risk Factors: -Group verbal and written instruction about important numbers in your health.  Discussion of what are risk factors and how they play a role in the disease process.  Review of Cholesterol, Blood Pressure, Diabetes, and BMI and the role they play in your overall health.   Core Components/Risk Factors/Patient Goals Review:  Goals and Risk Factor Review    Row Name 10/27/19  1134 11/15/19 1120 12/27/19 1123 12/27/19 1154       Core Components/Risk Factors/Patient Goals Review   Personal Goals Review  Weight Management/Obesity;Improve shortness of breath with ADL's;Hypertension;Lipids;Heart Failure  Weight Management/Obesity;Improve shortness of breath with ADL's;Hypertension;Lipids;Heart Failure  Weight Management/Obesity;Improve shortness of breath with ADL's;Hypertension;Lipids;Heart Failure  --    Review  Jonee is taking all meds as directed.  Her weight was up today and she will call her Dr to follow up about fluid weight.  She feels better since she started exercising.  She can do some ADLs easier.  Kaelene is watching her weight and it stays pretty steady with 3-4 lb range.  She has not talked to doctor about her fluid levels and watching her heart failure.  She denies any other heart failure symptoms.  Blood pressures have been good overall.  Zailah checks her weight daily.  It is still staying steady.  Her weight was up a little today after celebrating Mothers Day with children.  She had a little shortness of breath today.  She hasnt seen her Dr yet but will first of June.  Staff instructed her to call Dr if shortness of breath doesnt improve.    Expected Outcomes  Short:  follow up with Dr Laverta Baltimore: manage risk factors  Short: Talk to doctor about fluid levels  Long: Continue to manage heart failure  Short: follow up with Dr Laverta Baltimore: manage rsk factors  Short: follow up with Dr Laverta Baltimore: manage risk factors       Core Components/Risk Factors/Patient Goals at Discharge (Final Review):  Goals and Risk Factor Review - 12/27/19 1154      Core Components/Risk Factors/Patient Goals Review   Review  Staff instructed her to call Dr if shortness of breath doesnt improve.    Expected Outcomes  Short: follow up with Dr Laverta Baltimore: manage risk factors       ITP Comments: ITP Comments    Row Name 09/21/19 0857 09/22/19 1534 09/26/19 1536 09/27/19 1120 10/12/19 0625   ITP Comments   Completed virtual orientation today.  EP eval scheduled for 2/4 at 930. Documentation for diagnosis can be found in [facility] encounter 09/07/19.  Completed 6MWT and gym orientation.  Initial ITP created and sent for review to Dr. Emily Filbert, Medical Director.  Completed Initial RD Eval  First full day of exercise!  Patient was oriented to gym and equipment including functions, settings, policies, and procedures.  Patient's individual exercise prescription and treatment plan were reviewed.  All starting workloads were established based on the results of the 6 minute walk test done at initial orientation visit.  The plan for exercise progression was also introduced and progression will be customized based on patient's performance and goals.  30 day chart review completed. ITP sent to Dr Zachery Dakins Medical Director, for review,changes as  needed and signature. New to program   Row Name 10/25/19 1240 11/08/19 1138 11/09/19 0606 12/07/19 0537 01/02/20 1616   ITP Comments  Pulse oximeter not picking up HR. Placed on monitor to determine HR and rhythm. Found to be in A Fib. Note sent to referring physician. Patient is not aware of ever being in A fib. She is on Warfarin daily.  Dr. Rockey Situ returned fax of afib stating to continue with exercise program  30 day chart review completed. ITP sent to Dr Zachery Dakins Medical Director, for review,changes as needed and signature. Continue with ITP if no changes requested  30 Day review completed. Medical Director review done, changes made as directed,and approval shown by signature of Market researcher.  Karnisha called out today with a stomach bug.   [place] Name 01/04/20 0543           ITP Comments  30 Day review completed. ITP review done, changes made as directed,and approval shown by signature of  Scientist, research (life sciences).          Comments:

## 2020-01-05 ENCOUNTER — Other Ambulatory Visit: Payer: Self-pay

## 2020-01-05 DIAGNOSIS — I272 Pulmonary hypertension, unspecified: Secondary | ICD-10-CM

## 2020-01-05 DIAGNOSIS — I270 Primary pulmonary hypertension: Secondary | ICD-10-CM | POA: Diagnosis not present

## 2020-01-05 NOTE — Progress Notes (Signed)
Daily Session Note  Patient Details  Name: Ann Flynn MRN: [id number] Date of Birth: 02/16/1941 Referring Provider:     Pulmonary Rehab from 09/22/2019 in [facility]  Referring Provider  Bensimhon, Daniel MD      Encounter Date: 01/05/2020  Check In: Session Check In - 01/05/20 1134      Check-In   Supervising physician immediately available to respond to emergencies  See telemetry face sheet for immediately available ER MD    Location  [facility] & Pul[facility]    Staff Present    RN, BSN;Jessica Hawkins, MA, RCEP, CCRP, CCET;Amanda Sommer, BA, ACSM CEP, Exercise Physiologist;Melissa Caiola RDN, LDN    Virtual Visit  No    Medication changes reported      No    Fall or balance concerns reported     No    Warm-up and Cool-down  Performed on first and last piece of equipment    Resistance Training Performed  Yes    VAD Patient?  No    PAD/SET Patient?  No      Pain Assessment   Currently in Pain?  No/denies          Social History   Tobacco Use  Smoking Status Never Smoker  Smokeless Tobacco Never Used    Goals Met:  Proper associated with RPD/PD & O2 Sat Independence with exercise equipment Using PLB without cueing & demonstrates good technique Exercise tolerated well No report of cardiac concerns or symptoms Strength training completed today  Goals Unmet:  Not Applicable  Comments: Pt able to follow exercise prescription today without complaint.  Will continue to monitor for progression.   Dr. Mark Miller is Medical Director for [facility] and [facility]. 

## 2020-01-06 ENCOUNTER — Telehealth: Payer: Self-pay

## 2020-01-06 NOTE — Telephone Encounter (Signed)
Copied from [place] [id number]. Topic: General - Other >> Jan 06, 2020  1:56 PM Leward Quan A wrote: Reason for CRM: Patient called to get an appointment to have her Warfarin levels checked. She would like a call back from nurse to help her schedule this appointment. Can be reached At Ph# [phone]

## 2020-01-06 NOTE — Telephone Encounter (Signed)
Returned patients call and she was schedule for next Tuesday, 525/2021 nurse visit.

## 2020-01-10 ENCOUNTER — Encounter: Payer: Medicare Other | Admitting: *Deleted

## 2020-01-10 ENCOUNTER — Ambulatory Visit (INDEPENDENT_AMBULATORY_CARE_PROVIDER_SITE_OTHER): Payer: Medicare Other

## 2020-01-10 ENCOUNTER — Other Ambulatory Visit: Payer: Self-pay

## 2020-01-10 DIAGNOSIS — I272 Pulmonary hypertension, unspecified: Secondary | ICD-10-CM

## 2020-01-10 DIAGNOSIS — Z8673 Personal history of transient ischemic attack (TIA), and cerebral infarction without residual deficits: Secondary | ICD-10-CM | POA: Diagnosis not present

## 2020-01-10 DIAGNOSIS — I270 Primary pulmonary hypertension: Secondary | ICD-10-CM | POA: Diagnosis not present

## 2020-01-10 LAB — POCT INR
INR: 1.6 — AB (ref 2.0–3.0)
PT: 19

## 2020-01-10 NOTE — Progress Notes (Signed)
Daily Session Note  Patient Details  Name: Ann Flynn MRN: [id number] Date of Birth: 03/09/1941 Referring Provider:     Pulmonary Rehab from 09/22/2019 in [facility]  Referring Provider  Glori Bickers MD      Encounter Date: 01/10/2020  Check In: Session Check In - 01/10/20 1216      Check-In   Supervising physician immediately available to respond to emergencies  See telemetry face sheet for immediately available ER MD    Location  [facility]    Staff Present  Heath Lark, RN, BSN, CCRP;Joseph Hood RCP,RRT,BSRT;Amanda Manter, [place], ACSM CEP, Exercise Physiologist    Virtual Visit  No    Medication changes reported      No    Fall or balance concerns reported     No    Warm-up and Cool-down  Performed on first and last piece of equipment    Resistance Training Performed  Yes    VAD Patient?  No    PAD/SET Patient?  No      Pain Assessment   Currently in Pain?  No/denies          Social History   Tobacco Use  Smoking Status Never Smoker  Smokeless Tobacco Never Used    Goals Met:  Proper associated with RPD/PD & O2 Sat Independence with exercise equipment Exercise tolerated well No report of cardiac concerns or symptoms  Goals Unmet:  Not Applicable  Comments: Pt able to follow exercise prescription today without complaint.  Will continue to monitor for progression.    Dr. Emily Filbert is Medical Director for [place] and [facility].

## 2020-01-10 NOTE — Patient Instructions (Signed)
Description   Current Dose:82m on T,TH,Sat except 4 mg the other days. Changes: Take 344mon T,TH, Sat except 4 mg the other days  Recheck in 1 weeks

## 2020-01-11 ENCOUNTER — Ambulatory Visit
Admission: RE | Admit: 2020-01-11 | Discharge: 2020-01-11 | Disposition: A | Discharge status: Home / Self Care | Payer: Medicare Other | Source: Ambulatory Visit | Attending: Internal Medicine | Admitting: Internal Medicine

## 2020-01-11 DIAGNOSIS — R0602 Shortness of breath: Secondary | ICD-10-CM | POA: Insufficient documentation

## 2020-01-12 ENCOUNTER — Encounter: Payer: Medicare Other | Admitting: *Deleted

## 2020-01-12 ENCOUNTER — Other Ambulatory Visit: Payer: Self-pay

## 2020-01-12 DIAGNOSIS — I272 Pulmonary hypertension, unspecified: Secondary | ICD-10-CM

## 2020-01-12 DIAGNOSIS — I270 Primary pulmonary hypertension: Secondary | ICD-10-CM | POA: Diagnosis not present

## 2020-01-12 NOTE — Progress Notes (Signed)
Daily Session Note  Patient Details  Name: Mariadelcarmen Corella MRN: [id number] Date of Birth: 1941-06-25 Referring Provider:     Pulmonary Rehab from 09/22/2019 in [facility]  Referring Provider  Glori Bickers MD      Encounter Date: 01/12/2020  Check In: Session Check In - 01/12/20 1124      Check-In   Supervising physician immediately available to respond to emergencies  See telemetry face sheet for immediately available ER MD    Location  [facility]    Staff Present  Renita Papa, RN BSN;Melissa Caiola RDN, Rowe Pavy, BA, ACSM CEP, Exercise Physiologist    Virtual Visit  No    Medication changes reported      No    Fall or balance concerns reported     No    Warm-up and Cool-down  Performed on first and last piece of equipment    Resistance Training Performed  Yes    VAD Patient?  No    PAD/SET Patient?  No      Pain Assessment   Currently in Pain?  No/denies          Social History   Tobacco Use  Smoking Status Never Smoker  Smokeless Tobacco Never Used    Goals Met:  Independence with exercise equipment Exercise tolerated well No report of cardiac concerns or symptoms Strength training completed today  Goals Unmet:  Not Applicable  Comments: Pt able to follow exercise prescription today without complaint.  Will continue to monitor for progression.    Dr. Emily Filbert is Medical Director for [place] and [facility].

## 2020-01-17 NOTE — Progress Notes (Signed)
Established patient visit   Patient: Ann Flynn   DOB: March 16, 1941   79 y.o. Female  MRN: [id number] Visit Date: 01/18/2020  Today's healthcare provider: Trinna Post, PA-C   Chief Complaint  Patient presents with  . Diabetes  . Hypertension  . Hyperlipidemia   Subjective    HPI Diabetes Mellitus Type II, Follow-up  Lab Results  Component Value Date   HGBA1C 7.3 (A) 01/18/2020   HGBA1C 7.3 (A) 07/06/2019   HGBA1C 7.2 (H) 03/28/2019   Wt Readings from Last 3 Encounters:  01/18/20 254 lb (115.2 kg)  11/05/19 250 lb (113.4 kg)  10/06/19 244 lb (110.7 kg)   Last seen for diabetes 3 months ago.  Management since then includes no changes. She reports she stopped taking Trulicity in January because she could not afford it. She continues with 40-50 units Toujeo nightly.  She reports fair compliance with treatment. She is not having side effects.  Symptoms: No fatigue No foot ulcerations  No appetite changes No nausea  No paresthesia of the feet  No polydipsia  No polyuria No visual disturbances   No vomiting     Home blood sugar records: trend: stable  Episodes of hypoglycemia? Yes If she takes basal insulin without eating   Current insulin regiment: Toujeo 50 units nightly Most Recent Eye Exam: UTD Current exercise: none Current diet habits: not asked  Pertinent Labs: Lab Results  Component Value Date   CHOL 186 03/28/2019   HDL 43 03/28/2019   LDLCALC 95 03/28/2019   TRIG 240 (H) 03/28/2019   CHOLHDL 4.3 03/28/2019   Lab Results  Component Value Date   NA 142 11/05/2019   K 4.2 11/05/2019   CREATININE 1.69 (H) 11/05/2019   GFRNONAA 29 (L) 11/05/2019   GFRAA 33 (L) 11/05/2019   GLUCOSE 99 11/05/2019     Hypertension, follow-up  BP Readings from Last 3 Encounters:  01/18/20 128/68  11/05/19 (!) 126/50  08/04/19 132/62   Wt Readings from Last 3 Encounters:  01/18/20 254 lb (115.2 kg)  11/05/19 250 lb (113.4 kg)  10/06/19 244 lb (110.7  kg)     She was last seen for hypertension 3 months ago.  BP at that visit was normal. Management since that visit includes continuing amlodipine, losartan and metoprolol.  She reports good compliance with treatment. She is not having side effects.  She is following a Regular diet. She is not exercising. She does not smoke.  Use of agents associated with hypertension: none.   Outside blood pressures are not checked. Symptoms: No chest pain No chest pressure  No palpitations No syncope  Yes dyspnea No orthopnea  No paroxysmal nocturnal dyspnea Yes lower extremity edema   Pertinent labs: Lab Results  Component Value Date   CHOL 186 03/28/2019   HDL 43 03/28/2019   LDLCALC 95 03/28/2019   TRIG 240 (H) 03/28/2019   CHOLHDL 4.3 03/28/2019   Lab Results  Component Value Date   NA 142 11/05/2019   K 4.2 11/05/2019   CREATININE 1.69 (H) 11/05/2019   GFRNONAA 29 (L) 11/05/2019   GFRAA 33 (L) 11/05/2019   GLUCOSE 99 11/05/2019     The ASCVD Risk score (Goff DC Jr., et al., 2013) failed to calculate for the following reasons:   The patient has a prior MI or stroke diagnosis   Lipid/Cholesterol, Follow-up  Last lipid panel Other pertinent labs  Lab Results  Component Value Date   CHOL 186 03/28/2019  HDL 43 03/28/2019   LDLCALC 95 03/28/2019   TRIG 240 (H) 03/28/2019   CHOLHDL 4.3 03/28/2019   Lab Results  Component Value Date   ALT 8 03/28/2019   AST 12 03/28/2019   PLT 247 11/05/2019     She was last seen for this 3 months ago.  Management since that visit includes continue Lipitor 80 mg QHS.  She reports good compliance with treatment. She is not having side effects.   Symptoms: No chest pain No chest pressure/discomfort  Yes dyspnea Yes lower extremity edema  No numbness or tingling of extremity No orthopnea  No palpitations No paroxysmal nocturnal dyspnea  No speech difficulty No syncope   Current diet: not asked Current exercise: none  The ASCVD  Risk score (Vayas., et al., 2013) failed to calculate for the following reasons:   The patient has a prior MI or stroke diagnosis  CHF: patient is followed with cardiology by Dr. Glori Bickers at [facility]. She is due to see him on 01/27/2020. Since our last visit in 09/2019 she has gained 10 lbs. She reports increased lower extremity edema, left worse than right. She reports worsening shortness of breath. She is currently on lasix 40 mg QD with no potassium replacement. She reports this is the only fluid pill she has been on. She had a CT chest scan ordered by her pulmonologist Dr. Mortimer Fries on 01/11/2020 which showed new bilateral pleural effusions. She is currently on continuous oxygen via nasal cannula.   Wt Readings from Last 3 Encounters:  01/18/20 254 lb (115.2 kg)  11/05/19 250 lb (113.4 kg)  10/06/19 244 lb (110.7 kg)   Pulmonary HTN: Followed by Dr. Mortimer Fries, will see him next week. Continues on oxygen via nasal cannula.   ---------------------------------------------------------------------------------------------------      Medications: Outpatient Medications Prior to Visit  Medication Sig  . acetaminophen (TYLENOL) 500 MG tablet Take 500 mg by mouth 2 (two) times daily as needed for moderate pain.  [name] ambrisentan (LETAIRIS) 5 MG tablet Take 1 tablet (5 mg total) by mouth daily.  [name] amLODipine (NORVASC) 5 MG tablet TAKE ONE TABLET EVERY DAY  . atorvastatin (LIPITOR) 80 MG tablet TAKE 1 TABLET BY MOUTH DAILY  . BD PEN NEEDLE NANO U/F 32G X 4 MM MISC USE TO INJECT ONCE DAILY AS DIRECTED  . Cyanocobalamin (B-12 PO) Take 1 tablet by mouth daily.  [name] docusate sodium (COLACE) 100 MG capsule Take 100 mg by mouth at bedtime.  . Dulaglutide (TRULICITY) 1.02 VO/5.3GU SOPN Inject 0.75 mg into the skin once a week. (Patient taking differently: Inject 0.75 mg into the skin every Monday. )  . JANUVIA 25 MG tablet TAKE ONE TABLET BY MOUTH EVERY DAY  . losartan (COZAAR) 100 MG tablet TAKE ONE  TABLET EVERY DAY  . metoprolol tartrate (LOPRESSOR) 25 MG tablet TAKE 1 TABLET BY MOUTH TWICE DAILY  . omeprazole (PRILOSEC) 40 MG capsule TAKE 1 CAPSULE BY MOUTH ONCE DAILY  . ONETOUCH ULTRA test strip USE AS DIRECTED  . tadalafil, PAH, (ADCIRCA) 20 MG tablet Take 2 tablets (40 mg total) by mouth daily.  [name] SOLOSTAR 300 UNIT/ML Solostar Pen INJECT 50 UNITS INTO THE SKIN DAILY  . venlafaxine (EFFEXOR) 75 MG tablet TAKE ONE TABLET EVERY DAY  . VITAMIN D PO Take 1 capsule by mouth daily.  [name] warfarin (COUMADIN) 3 MG tablet TAKE 1 TABLET BY MOUTH ON MONDAY WEDNESDAY AND FRIDAY ALONG WITH 4MG ON THE REMAINING DAYS  . [  DISCONTINUED] furosemide (LASIX) 40 MG tablet TAKE 1 TABLET BY MOUTH DAILY   No facility-administered medications prior to visit.    Review of Systems  Constitutional: Negative.   Respiratory: Negative.   Cardiovascular: Negative.   Hematological: Negative.       Objective    BP 128/68 (BP Location: Left Arm, Patient Position: Sitting, Cuff Size: Large)   Pulse 63   Temp (!) 97.1 F (36.2 C) (Temporal)   Wt 254 lb (115.2 kg)   SpO2 95%   BMI 42.27 kg/m    Physical Exam Constitutional:      Appearance: Normal appearance. She is obese.  Cardiovascular:     Rate and Rhythm: Normal rate and regular rhythm.     Heart sounds: Normal heart sounds.  Pulmonary:     Effort: Pulmonary effort is normal.     Breath sounds: Decreased air movement present. Decreased breath sounds present. No wheezing or rhonchi.     Comments: Lung sounds are distant.  Musculoskeletal:     Right lower leg: 2+ Pitting Edema present.     Left lower leg: 2+ Pitting Edema present.  Skin:    General: Skin is warm and dry.  Neurological:     Mental Status: She is alert. Mental status is at baseline.  Psychiatric:        Mood and Affect: Mood normal.        Behavior: Behavior normal.       Results for orders placed or performed in visit on 01/18/20  POCT glycosylated hemoglobin (Hb  A1C)  Result Value Ref Range   Hemoglobin A1C 7.3 (A) 4.0 - 5.6 %   HbA1c POC (<> result, manual entry)     HbA1c, POC (prediabetic range)     HbA1c, POC (controlled diabetic range)     Est. average glucose Bld gHb Est-mCnc 163     Assessment & Plan    1. Congestive heart failure, unspecified HF chronicity, unspecified heart failure type ([place])  She appears volume overloaded today. She has a 10 lb weight gain since 09/2019 with 2+ bilateral pitting edema on exam and new onset bilateral pleural effusions on 01/11/2020 CT chest scan. She is currently on Lasix 40 mg QD. I am changing her to torsemide 40 mg QD and adding potassium chloride 10 mEq daily to help with diuresis. She has an upcoming appointment with nephrology on 01/25/2020 with Dr. Griffith Citron PA and I have contacted [facility] [place] [facility] and requested they repeat a BMET or CMET at that time to check her renal function after starting torsemide. She also has follow up with Dr. [name] on 01/27/2020. I will forward this chart to her specialists for their knowledge of the medication changes.  2. Hyperlipidemia associated with type 2 diabetes mellitus ([place])  Previously well controlled Continue statin Repeat FLP and CMP Goal LDL < 70   3. Pulmonary hypertension, primary ([place])  Follow with Dr. Mortimer Fries.    4. Hypertension associated with diabetes ([place])  Well controlled Continue current medications Recheck metabolic panel F/u in 3 months   5. Pleural effusion  Volume overloaded, change to torsemide as below.   - torsemide (DEMADEX) 20 MG tablet; Take 2 tablets (40 mg total) by mouth daily.  Dispense: 180 tablet; Refill: 0  6. Left leg swelling  Check Doppler today.   - US Venous Img Lower Unilateral Left - torsemide (DEMADEX) 20 MG tablet; Take 2 tablets (40 mg total) by mouth daily.  Dispense: 180 tablet; Refill: 0  7. History of pulmonary embolism  Doubtful DVT on Warfarin but will check base don patient  concerns.   - US Venous Img Lower Unilateral Left  8. Vitamin B12 deficiency  - Comprehensive Metabolic Panel (CMET) - CBC with Differential - Lipid Profile - Vitamin B12  9. Diabetes mellitus without complication ([place])  She stopped trulicity on her own because she could not afford it. Referring to CCM for medication assistance as I really thinks she needs this medication.    10. CKD (chronic kidney disease), stage IV ([place])  Followed by nephrology, contacted [facility] and they will repeat CMET/BMET on 01/25/2020.     F/u 3 months chronic      I, Trinna Post, PA-C, have reviewed all documentation for this visit. The documentation on 01/18/20 for the exam, diagnosis, procedures, and orders are all accurate and complete.'  I have spent 45 minutes with this patient, >50% of which was spent on counseling and coordination of care.   Paulene Floor  [facility] [phone] (phone) [phone] (fax)  [place]

## 2020-01-18 ENCOUNTER — Ambulatory Visit
Admission: RE | Admit: 2020-01-18 | Discharge: 2020-01-18 | Disposition: A | Discharge status: Home / Self Care | Payer: Medicare Other | Source: Ambulatory Visit | Attending: Physician Assistant | Admitting: Physician Assistant

## 2020-01-18 ENCOUNTER — Other Ambulatory Visit: Payer: Self-pay

## 2020-01-18 ENCOUNTER — Ambulatory Visit (INDEPENDENT_AMBULATORY_CARE_PROVIDER_SITE_OTHER): Payer: Medicare Other | Admitting: Physician Assistant

## 2020-01-18 ENCOUNTER — Encounter: Payer: Self-pay | Admitting: Physician Assistant

## 2020-01-18 ENCOUNTER — Other Ambulatory Visit (HOSPITAL_COMMUNITY): Payer: Self-pay | Admitting: Internal Medicine

## 2020-01-18 VITALS — BP 128/68 | HR 63 | Temp 97.1°F | Wt 254.0 lb

## 2020-01-18 DIAGNOSIS — I270 Primary pulmonary hypertension: Secondary | ICD-10-CM | POA: Diagnosis not present

## 2020-01-18 DIAGNOSIS — Z86711 Personal history of pulmonary embolism: Secondary | ICD-10-CM | POA: Diagnosis present

## 2020-01-18 DIAGNOSIS — E1159 Type 2 diabetes mellitus with other circulatory complications: Secondary | ICD-10-CM

## 2020-01-18 DIAGNOSIS — M7989 Other specified soft tissue disorders: Secondary | ICD-10-CM | POA: Diagnosis present

## 2020-01-18 DIAGNOSIS — J90 Pleural effusion, not elsewhere classified: Secondary | ICD-10-CM

## 2020-01-18 DIAGNOSIS — E1169 Type 2 diabetes mellitus with other specified complication: Secondary | ICD-10-CM

## 2020-01-18 DIAGNOSIS — E119 Type 2 diabetes mellitus without complications: Secondary | ICD-10-CM | POA: Diagnosis not present

## 2020-01-18 DIAGNOSIS — I10 Essential (primary) hypertension: Secondary | ICD-10-CM

## 2020-01-18 DIAGNOSIS — N184 Chronic kidney disease, stage 4 (severe): Secondary | ICD-10-CM

## 2020-01-18 DIAGNOSIS — E785 Hyperlipidemia, unspecified: Secondary | ICD-10-CM

## 2020-01-18 DIAGNOSIS — I509 Heart failure, unspecified: Secondary | ICD-10-CM | POA: Diagnosis not present

## 2020-01-18 DIAGNOSIS — E538 Deficiency of other specified B group vitamins: Secondary | ICD-10-CM

## 2020-01-18 LAB — POCT GLYCOSYLATED HEMOGLOBIN (HGB A1C)
Est. average glucose Bld gHb Est-mCnc: 163
Hemoglobin A1C: 7.3 % — AB (ref 4.0–5.6)

## 2020-01-18 MED ORDER — POTASSIUM CHLORIDE ER 10 MEQ PO TBCR: 10.0000 meq | EXTENDED_RELEASE_TABLET | Freq: Every day | ORAL | 0 refills | Status: DC

## 2020-01-18 MED ORDER — TORSEMIDE 20 MG PO TABS: 40.0000 mg | ORAL_TABLET | Freq: Every day | ORAL | 0 refills | Status: DC

## 2020-01-18 NOTE — Patient Instructions (Signed)
Heart Failure, Diagnosis  Heart failure means that your heart is not able to pump blood in the right way. This makes it hard for your body to work well. Heart failure is usually a long-term (chronic) condition. You must take good care of yourself and follow your treatment plan from your doctor. What are the causes? This condition may be caused by:  High blood pressure.  Build up of cholesterol and fat in the arteries.  Heart attack. This injures the heart muscle.  Heart valves that do not open and close properly.  Damage of the heart muscle. This is also called cardiomyopathy.  Lung disease.  Abnormal heart rhythms. What increases the risk? The risk of heart failure goes up as a person ages. This condition is also more likely to develop in people who:  Are overweight.  Are female.  Smoke or chew tobacco.  Abuse alcohol or illegal drugs.  Have taken medicines that can damage the heart.  Have diabetes.  Have abnormal heart rhythms.  Have thyroid problems.  Have low blood counts (anemia). What are the signs or symptoms? Symptoms of this condition include:  Shortness of breath.  Coughing.  Swelling of the feet, ankles, legs, or belly.  Losing weight for no reason.  Trouble breathing.  Waking from sleep because of the need to sit up and get more air.  Rapid heartbeat.  Being very tired.  Feeling dizzy, or feeling like you may pass out (faint).  Having no desire to eat.  Feeling like you may vomit (nauseous).  Peeing (urinating) more at night.  Feeling confused. How is this treated?     This condition may be treated with:  Medicines. These can be given to treat blood pressure and to make the heart muscles stronger.  Changes in your daily life. These may include eating a healthy diet, staying at a healthy body weight, quitting tobacco and illegal drug use, or doing exercises.  Surgery. Surgery can be done to open blocked valves, or to put devices in  the heart, such as pacemakers.  A donor heart (heart transplant). You will receive a healthy heart from a donor. Follow these instructions at home:  Treat other conditions as told by your doctor. These may include high blood pressure, diabetes, thyroid disease, or abnormal heart rhythms.  Learn as much as you can about heart failure.  Get support as you need it.  Keep all follow-up visits as told by your doctor. This is important. Summary  Heart failure means that your heart is not able to pump blood in the right way.  This condition is caused by high blood pressure, heart attack, or damage of the heart muscle.  Symptoms of this condition include shortness of breath and swelling of the feet, ankles, legs, or belly. You may also feel very tired or feel like you may vomit.  You may be treated with medicines, surgery, or changes in your daily life.  Treat other health conditions as told by your doctor. This information is not intended to replace advice given to you by your health care provider. Make sure you discuss any questions you have with your health care provider. Document Revised: 10/22/2018 Document Reviewed: 10/22/2018 Elsevier Patient Education  [place].

## 2020-01-19 ENCOUNTER — Telehealth (HOSPITAL_COMMUNITY): Payer: Self-pay

## 2020-01-19 ENCOUNTER — Telehealth: Payer: Self-pay

## 2020-01-19 LAB — LIPID PANEL
Chol/HDL Ratio: 2.4 ratio (ref 0.0–4.4)
Cholesterol, Total: 104 mg/dL (ref 100–199)
HDL: 43 mg/dL (ref >39)
LDL Chol Calc (NIH): 46 mg/dL (ref 0–99)
Triglycerides: 68 mg/dL (ref 0–149)
VLDL Cholesterol Cal: 15 mg/dL (ref 5–40)

## 2020-01-19 LAB — CBC WITH DIFFERENTIAL/PLATELET
Basophils Absolute: 0.1 10*3/uL (ref 0.0–0.2)
Basos: 1 %
EOS (ABSOLUTE): 0.2 10*3/uL (ref 0.0–0.4)
Eos: 4 %
Hematocrit: 31 % — ABNORMAL LOW (ref 34.0–46.6)
Hemoglobin: 9.4 g/dL — ABNORMAL LOW (ref 11.1–15.9)
Immature Grans (Abs): 0 10*3/uL (ref 0.0–0.1)
Immature Granulocytes: 0 %
Lymphocytes Absolute: 1 10*3/uL (ref 0.7–3.1)
Lymphs: 16 %
MCH: 27.8 pg (ref 26.6–33.0)
MCHC: 30.3 g/dL — ABNORMAL LOW (ref 31.5–35.7)
MCV: 92 fL (ref 79–97)
Monocytes Absolute: 0.6 10*3/uL (ref 0.1–0.9)
Monocytes: 9 %
Neutrophils Absolute: 4.2 10*3/uL (ref 1.4–7.0)
Neutrophils: 70 %
Platelets: 256 10*3/uL (ref 150–450)
RBC: 3.38 x10E6/uL — ABNORMAL LOW (ref 3.77–5.28)
RDW: 15.3 % (ref 11.7–15.4)
WBC: 6 10*3/uL (ref 3.4–10.8)

## 2020-01-19 LAB — COMPREHENSIVE METABOLIC PANEL
ALT: 9 IU/L (ref 0–32)
AST: 11 IU/L (ref 0–40)
Albumin/Globulin Ratio: 1.9 (ref 1.2–2.2)
Albumin: 4 g/dL (ref 3.7–4.7)
Alkaline Phosphatase: 77 IU/L (ref 48–121)
BUN/Creatinine Ratio: 31 — ABNORMAL HIGH (ref 12–28)
BUN: 51 mg/dL — ABNORMAL HIGH (ref 8–27)
Bilirubin Total: 0.3 mg/dL (ref 0.0–1.2)
CO2: 25 mmol/L (ref 20–29)
Calcium: 8.7 mg/dL (ref 8.7–10.3)
Chloride: 105 mmol/L (ref 96–106)
Creatinine, Ser: 1.65 mg/dL — ABNORMAL HIGH (ref 0.57–1.00)
GFR calc Af Amer: 34 mL/min/{1.73_m2} — ABNORMAL LOW (ref >59)
GFR calc non Af Amer: 30 mL/min/{1.73_m2} — ABNORMAL LOW (ref >59)
Globulin, Total: 2.1 g/dL (ref 1.5–4.5)
Glucose: 168 mg/dL — ABNORMAL HIGH (ref 65–99)
Potassium: 4.8 mmol/L (ref 3.5–5.2)
Sodium: 141 mmol/L (ref 134–144)
Total Protein: 6.1 g/dL (ref 6.0–8.5)

## 2020-01-19 LAB — VITAMIN B12: Vitamin B-12: >2000 pg/mL — ABNORMAL HIGH (ref 232–1245)

## 2020-01-19 NOTE — Telephone Encounter (Signed)
Patient advised and verbalized understanding. I called [facility] and requested a verbal add on for the test below. Awaiting confirmation.

## 2020-01-19 NOTE — Telephone Encounter (Signed)
Received a fax from [facility] requesting patients next appt and last ov notes. Records were successfully faxed over to [phone]

## 2020-01-19 NOTE — Telephone Encounter (Signed)
-----  Message from Trinna Post, [place] sent at 01/19/2020  9:56 AM EDT ----- Can we call and let patient know that her kidney function and other labs look stable. Her B12 is normal. Her red blood cells and hemoglobin are a little low. Can we please add an Fe+ TIBC + Fer undr dx anemia? Can we also let her know what I reached out to the kidney center and they are going to recheck her kidney labs at her 01/25/2020 visit and I also let her cardiologist know about the medication changes.

## 2020-01-20 ENCOUNTER — Telehealth: Payer: Self-pay | Admitting: Physician Assistant

## 2020-01-20 ENCOUNTER — Telehealth: Payer: Self-pay

## 2020-01-20 DIAGNOSIS — E611 Iron deficiency: Secondary | ICD-10-CM

## 2020-01-20 MED ORDER — FERROUS SULFATE 325 (65 FE) MG PO TBEC: 325.0000 mg | DELAYED_RELEASE_TABLET | Freq: Three times a day (TID) | ORAL | 0 refills | Status: DC

## 2020-01-20 NOTE — Telephone Encounter (Signed)
Patient was advised and states she will come pick up hemoccult cards on this upcoming Tuesday,01/24/2020. Medication was send into pharmacy.

## 2020-01-20 NOTE — Telephone Encounter (Signed)
-----  Message from Trinna Post, [place] sent at 01/20/2020  4:07 PM EDT ----- Can we let her know her iron levels are low? I would like her to take ferrous sulfate 325 mg TID to help with this. This can cause black stool or constipation. Can we also leave her three hemoccult cards up front for her to check her stool. She can place the samples on the card and return them to the office in the next couple of weeks. This will check for hidden blood in her stool.

## 2020-01-20 NOTE — Chronic Care Management (AMB) (Signed)
  Chronic Care Management   Note  01/20/2020 Name: Ann Flynn MRN: [id number] DOB: 07/03/1941  Ann Flynn is a 79 y.o. year old female who is a primary care patient of Trinna Post, [place]. I reached out to [name] by phone today in response to a referral sent by Ms. Galvin Proffer PCP, Carles Collet PA-C     Ann Flynn was given information about Chronic Care Management services today including:  1. CCM service includes personalized support from designated clinical staff supervised by her physician, including individualized plan of care and coordination with other care providers 2. 24/7 contact phone numbers for assistance for urgent and routine care needs. 3. Service will only be billed when office clinical staff spend 20 minutes or more in a month to coordinate care. 4. Only one practitioner may furnish and bill the service in a calendar month. 5. The patient may stop CCM services at any time (effective at the end of the month) by phone call to the office staff. 6. The patient will be responsible for cost sharing (co-pay) of up to 20% of the service fee (after annual deductible is met).  Patient agreed to services and verbal consent obtained.   Follow up plan: Telephone appointment with care management team member scheduled for:02/17/2020  Glenna Durand, LPN Health Advisor, [place] Management ??[email or web address] ??[phone]

## 2020-01-24 ENCOUNTER — Encounter: Payer: Self-pay | Admitting: Physician Assistant

## 2020-01-24 ENCOUNTER — Other Ambulatory Visit: Payer: Self-pay

## 2020-01-24 ENCOUNTER — Encounter: Payer: Medicare Other | Attending: Internal Medicine | Admitting: *Deleted

## 2020-01-24 DIAGNOSIS — I270 Primary pulmonary hypertension: Secondary | ICD-10-CM | POA: Diagnosis present

## 2020-01-24 DIAGNOSIS — Z7901 Long term (current) use of anticoagulants: Secondary | ICD-10-CM | POA: Insufficient documentation

## 2020-01-24 DIAGNOSIS — Z79899 Other long term (current) drug therapy: Secondary | ICD-10-CM | POA: Diagnosis not present

## 2020-01-24 DIAGNOSIS — E118 Type 2 diabetes mellitus with unspecified complications: Secondary | ICD-10-CM | POA: Insufficient documentation

## 2020-01-24 DIAGNOSIS — Z794 Long term (current) use of insulin: Secondary | ICD-10-CM | POA: Diagnosis not present

## 2020-01-24 DIAGNOSIS — I272 Pulmonary hypertension, unspecified: Secondary | ICD-10-CM

## 2020-01-24 NOTE — Progress Notes (Signed)
Daily Session Note  Patient Details  Name: Ann Flynn MRN: [id number] Date of Birth: 13-Jun-1941 Referring Provider:     Pulmonary Rehab from 09/22/2019 in [facility] Cardiac and Pulmonary Rehab  Referring Provider  Glori Bickers MD      Encounter Date: 01/24/2020  Check In: Session Check In - 01/24/20 1113      Check-In   Supervising physician immediately available to respond to emergencies  See telemetry face sheet for immediately available ER MD    Location  [facility]-Cardiac & Pulmonary Rehab    Staff Present  Heath Lark, RN, BSN, CCRP;Melissa Caiola RDN, LDN;Joseph Mascot [employer]    Virtual Visit  No    Medication changes reported      Yes    Comments  Furosemide stopped   Torsemide 20 mg two times a day and potassium chlorideER 10 Meq daily new meds    Fall or balance concerns reported     No    Warm-up and Cool-down  Performed on first and last piece of equipment    Resistance Training Performed  Yes    VAD Patient?  No    PAD/SET Patient?  No      Pain Assessment   Currently in Pain?  No/denies          Social History   Tobacco Use  Smoking Status Never Smoker  Smokeless Tobacco Never Used    Goals Met:  Proper associated with RPD/PD & O2 Sat Independence with exercise equipment Exercise tolerated well No report of cardiac concerns or symptoms  Goals Unmet:  Not Applicable  Comments: Pt able to follow exercise prescription today without complaint.  Will continue to monitor for progression.    Dr. Emily Filbert is Medical Director for [place] [facility].

## 2020-01-25 ENCOUNTER — Ambulatory Visit: Payer: Medicare Other

## 2020-01-25 ENCOUNTER — Other Ambulatory Visit: Payer: Medicare Other

## 2020-01-26 ENCOUNTER — Other Ambulatory Visit: Payer: Self-pay | Admitting: Physician Assistant

## 2020-01-26 DIAGNOSIS — I270 Primary pulmonary hypertension: Secondary | ICD-10-CM

## 2020-01-26 NOTE — Telephone Encounter (Signed)
Requested Prescriptions  Pending Prescriptions Disp Refills   metoprolol tartrate (LOPRESSOR) 25 MG tablet [Pharmacy Med Name: METOPROLOL TARTRATE 25 MG TAB] 180 tablet 1    Sig: TAKE 1 TABLET BY MOUTH TWICE DAILY     Cardiovascular:  Beta Blockers Passed - 01/26/2020  2:55 PM      Passed - Last BP in normal range    BP Readings from Last 1 Encounters:  01/18/20 128/68         Passed - Last Heart Rate in normal range    Pulse Readings from Last 1 Encounters:  01/18/20 63         Passed - Valid encounter within last 6 months    Recent Outpatient Visits          1 week ago Congestive heart failure, unspecified HF chronicity, unspecified heart failure type [facility])   [place], Adriana M, PA-C   3 months ago Chronic pulmonary embolism, unspecified pulmonary embolism type, unspecified whether acute cor pulmonale present [facility])   [facility] [place], Fabio Bering M, PA-C   5 months ago History of stroke   [place], [place], PA-C   6 months ago Diabetes mellitus without complication [facility])   [place], [place], PA-C   10 months ago Congestive heart failure, unspecified HF chronicity, unspecified heart failure type [facility])   [facility] Trinna Post, [place]      Future Appointments            In 4 days Martyn Ehrich, NP [place]   In 2 months Trinna Post, PA-C [employer], [facility]

## 2020-01-27 ENCOUNTER — Other Ambulatory Visit: Payer: Self-pay

## 2020-01-27 ENCOUNTER — Telehealth (HOSPITAL_COMMUNITY): Payer: Self-pay | Admitting: *Deleted

## 2020-01-27 ENCOUNTER — Ambulatory Visit (HOSPITAL_COMMUNITY)
Admission: RE | Admit: 2020-01-27 | Discharge: 2020-01-27 | Disposition: A | Discharge status: Home / Self Care | Payer: Medicare Other | Source: Ambulatory Visit | Attending: Internal Medicine | Admitting: Internal Medicine

## 2020-01-27 ENCOUNTER — Encounter (HOSPITAL_COMMUNITY): Payer: Self-pay | Admitting: Internal Medicine

## 2020-01-27 ENCOUNTER — Telehealth: Payer: Self-pay | Admitting: Physician Assistant

## 2020-01-27 VITALS — BP 116/60 | HR 82 | Ht 65.0 in | Wt 249.2 lb

## 2020-01-27 DIAGNOSIS — Z86711 Personal history of pulmonary embolism: Secondary | ICD-10-CM | POA: Diagnosis not present

## 2020-01-27 DIAGNOSIS — Z79899 Other long term (current) drug therapy: Secondary | ICD-10-CM | POA: Insufficient documentation

## 2020-01-27 DIAGNOSIS — G4733 Obstructive sleep apnea (adult) (pediatric): Secondary | ICD-10-CM | POA: Diagnosis not present

## 2020-01-27 DIAGNOSIS — Z7901 Long term (current) use of anticoagulants: Secondary | ICD-10-CM | POA: Insufficient documentation

## 2020-01-27 DIAGNOSIS — E1122 Type 2 diabetes mellitus with diabetic chronic kidney disease: Secondary | ICD-10-CM | POA: Insufficient documentation

## 2020-01-27 DIAGNOSIS — Z882 Allergy status to sulfonamides status: Secondary | ICD-10-CM | POA: Insufficient documentation

## 2020-01-27 DIAGNOSIS — I272 Pulmonary hypertension, unspecified: Secondary | ICD-10-CM | POA: Insufficient documentation

## 2020-01-27 DIAGNOSIS — I130 Hypertensive heart and chronic kidney disease with heart failure and stage 1 through stage 4 chronic kidney disease, or unspecified chronic kidney disease: Secondary | ICD-10-CM | POA: Insufficient documentation

## 2020-01-27 DIAGNOSIS — I2510 Atherosclerotic heart disease of native coronary artery without angina pectoris: Secondary | ICD-10-CM | POA: Insufficient documentation

## 2020-01-27 DIAGNOSIS — Z794 Long term (current) use of insulin: Secondary | ICD-10-CM | POA: Diagnosis not present

## 2020-01-27 DIAGNOSIS — I270 Primary pulmonary hypertension: Secondary | ICD-10-CM | POA: Diagnosis not present

## 2020-01-27 DIAGNOSIS — I4891 Unspecified atrial fibrillation: Secondary | ICD-10-CM

## 2020-01-27 DIAGNOSIS — I2782 Chronic pulmonary embolism: Secondary | ICD-10-CM

## 2020-01-27 DIAGNOSIS — I480 Paroxysmal atrial fibrillation: Secondary | ICD-10-CM | POA: Diagnosis not present

## 2020-01-27 DIAGNOSIS — Z6841 Body Mass Index (BMI) 40.0 and over, adult: Secondary | ICD-10-CM | POA: Diagnosis not present

## 2020-01-27 DIAGNOSIS — I2781 Cor pulmonale (chronic): Secondary | ICD-10-CM | POA: Diagnosis not present

## 2020-01-27 DIAGNOSIS — Z880 Allergy status to penicillin: Secondary | ICD-10-CM | POA: Diagnosis not present

## 2020-01-27 DIAGNOSIS — Z8673 Personal history of transient ischemic attack (TIA), and cerebral infarction without residual deficits: Secondary | ICD-10-CM | POA: Insufficient documentation

## 2020-01-27 DIAGNOSIS — I5032 Chronic diastolic (congestive) heart failure: Secondary | ICD-10-CM | POA: Diagnosis present

## 2020-01-27 DIAGNOSIS — N184 Chronic kidney disease, stage 4 (severe): Secondary | ICD-10-CM | POA: Insufficient documentation

## 2020-01-27 LAB — COMPREHENSIVE METABOLIC PANEL
ALT: 12 U/L (ref 0–44)
AST: 16 U/L (ref 15–41)
Albumin: 3.5 g/dL (ref 3.5–5.0)
Alkaline Phosphatase: 56 U/L (ref 38–126)
Anion gap: 10 (ref 5–15)
BUN: 59 mg/dL — ABNORMAL HIGH (ref 8–23)
CO2: 27 mmol/L (ref 22–32)
Calcium: 8.7 mg/dL — ABNORMAL LOW (ref 8.9–10.3)
Chloride: 105 mmol/L (ref 98–111)
Creatinine, Ser: 2.13 mg/dL — ABNORMAL HIGH (ref 0.44–1.00)
GFR calc Af Amer: 25 mL/min — ABNORMAL LOW (ref >60)
GFR calc non Af Amer: 22 mL/min — ABNORMAL LOW (ref >60)
Glucose, Bld: 155 mg/dL — ABNORMAL HIGH (ref 70–99)
Potassium: 5.2 mmol/L — ABNORMAL HIGH (ref 3.5–5.1)
Sodium: 142 mmol/L (ref 135–145)
Total Bilirubin: 0.5 mg/dL (ref 0.3–1.2)
Total Protein: 6 g/dL — ABNORMAL LOW (ref 6.5–8.1)

## 2020-01-27 LAB — BRAIN NATRIURETIC PEPTIDE: B Natriuretic Peptide: 450.6 pg/mL — ABNORMAL HIGH (ref 0.0–100.0)

## 2020-01-27 MED ORDER — EMPAGLIFLOZIN 10 MG PO TABS: 10.0000 mg | ORAL_TABLET | Freq: Every day | ORAL | 1 refills | Status: DC

## 2020-01-27 NOTE — Patient Instructions (Addendum)
STOP Januvia  Start Jardiance 10 mg daily  Your provider has recommended that  you wear a Zio Patch for 14 days.  This monitor will record your heart rhythm for our review.  IF you have any symptoms while wearing the monitor please press the button.  If you have any issues with the patch or you notice a red or orange light on it please call the company at [phone].  Once you remove the patch please mail it back to the company as soon as possible so we can get the results.  Labs done today, your results will be available in MyChart, we will contact you for abnormal readings.  Your physician recommends that you schedule a follow-up appointment in: 3 months  If you have any questions or concerns before your next appointment please send [place] a message through [place] or call our office at [phone].    TO LEAVE A MESSAGE FOR THE NURSE SELECT OPTION 2, PLEASE LEAVE A MESSAGE INCLUDING: . YOUR NAME . DATE OF BIRTH . CALL BACK NUMBER . REASON FOR CALL**this is important as we prioritize the call backs  [place] AS LONG AS YOU CALL BEFORE 4:00 PM  At the [facility], you and your health needs are our priority. As part of our continuing mission to provide you with exceptional heart care, we have created designated Provider Care Teams. These Care Teams include your primary Cardiologist (physician) and Advanced Practice Providers (APPs- Physician Assistants and Nurse Practitioners) who all work together to provide you with the care you need, when you need it.   You may see any of the following providers on your designated Care Team at your next follow up: [name] Dr Glori Bickers . Dr Loralie Champagne . Darrick Grinder, NP . Lyda Jester, PA . Audry Riles, PharmD   Please be sure to bring in all your medications bottles to every appointment.

## 2020-01-27 NOTE — Progress Notes (Addendum)
Advanced Heart Failure Clinic Note   Date:  01/27/2020   ID:  Ann Flynn, DOB [birth year], MRN [id number]  Location: Home  Provider location: [place] [facility] Type of Visit: Established patient  PCP:  Ann Post, PA-C  Cardiologist:  Ann Flynn Primary HF: Ann Flynn  Chief Complaint: PAH follow-up   History of Present Illness:  [name] is a 79 year old woman with h/o morbid obesity, DM, OSA on CPAP, CKD 4, PAD, CVA, carotid surgery in 2010, pulmonary embolism (2009) and PAH referred by Ann Flynn for further evaluation of her Ann Flynn.   She recently moved from [place], [place] to be near her family as things were getting harder. Lives by herself in a townhome.  Reports DVT/PE in 2009 and has been on coumadin since. Was diagnosed with PAH in 2016 at [facility] in [place]. Right and left heart cath at that time reported as nonobstructive CAD and severe PAH with PAP 103/19 (51) with PCWP 5. (see results below) Was told she had idiopathic PAH and started on Letairis and cialis. She weighed 255 pounds at the time.   Never smoked but was exposed to 2ndhand smoke. Previouslyon portable oxygen at 5L when she was in [place], but the portable O2 was not covered. Now "goes without oxygen", only at nightthat she wear oxygen with CPAP. Denies h/o CTD.   Has seen Ann Flynn in Pulmonary and had PFTs. Unable to do 6MW.  PFTs 2/10  FEV1 1.92 (57%) FVC 2.57 (58%) DLCO 61%  I saw her for the first time in 11/20: Felt to have primarily WHO Group III PH. Echo and VQ ordered. Amlodipine 2.5 added for HTN  - Echo 07/07/19: EF 55-60% RV mildly dilated with norma function. No RVSP calculated - VQ 07/07/19: No PE  RHC  07/29/19 RA = 8 RV = 81/14 PA =  80/18 (42) PCW =17 Fick cardiac output/index = 9.25/4.25 PVR = 2.70 WU FA sat = 98% PA sat = 75%, 75% High SVC 72%  Here for routine f/u. Recently switched from lasix to torsemide 40 daily ()she is  taking 20 bid)  by PCP due to fluid overload and 10 pound weight gain. Feeling much better. Weight down 5 pounds.  Less SOB. Going to Pulmonary Rehab at [facility] using Tm and bike x 64mns each. Edema improved. No orthopnea or PND. HgBA1c ~7    Previous cardiac studies:    12/16/2014 right and left heart catheterization with nitric oxide inhalation, Pressure data resting: -Mean right atrial pressure 2 Right ventricule 1830/9and diastolic -Pulmonary artery 103/19, mean 51. Pulmonary artery wedge mean 5 -Ascending thoracic aorta 111/53, mean 75. -Fick cardiac output 4.2 L. -Pulmonary resistance 12.25 Woods units. With nitric oxide inhalation -Pulmonary artery 89/29, mean 42. -Right ventricle 940/7and diastolic. -Right atrial 2. -Fick cardiac output 4.94 L. -Pulmonary resistance 15.91 Woods units.   Ann Schoenfelderdenies symptoms worrisome for COVID 19.   No past medical history on file. Past Surgical History:  Procedure Laterality Date  . ABDOMINAL HYSTERECTOMY  1997  . cartiod artery surgery  2010   per patient   . CHOLECYSTECTOMY  10/19/2009  . MASTECTOMY  1994  . RIGHT HEART CATH N/A 07/29/2019   Procedure: RIGHT HEART CATH;  Surgeon: Ann Artist MD;  [facility];  Service: Cardiovascular;  Laterality: N/A;     Current Outpatient Medications  Medication Sig Dispense Refill  . acetaminophen (TYLENOL) 500 MG tablet Take 500  mg by mouth 2 (two) times daily as needed for moderate pain.    [name] ambrisentan (LETAIRIS) 5 MG tablet Take 1 tablet (5 mg total) by mouth daily. 90 tablet 1  . amLODipine (NORVASC) 5 MG tablet TAKE ONE TABLET EVERY DAY 30 tablet 5  . atorvastatin (LIPITOR) 80 MG tablet TAKE 1 TABLET BY MOUTH DAILY 90 tablet 2  . BD PEN NEEDLE NANO U/F 32G X 4 MM MISC USE TO INJECT ONCE DAILY AS DIRECTED 100 each 2  . Cyanocobalamin (Ann-12 PO) Take 1 tablet by mouth daily.    [name] docusate sodium (COLACE) 100 MG capsule Take 100 mg by mouth at  bedtime.    . Dulaglutide (TRULICITY) 0.07 MA/2.6JF SOPN Inject 0.75 mg into the skin once a week. 4 pen 0  . ferrous sulfate 325 (65 FE) MG EC tablet Take 1 tablet (325 mg total) by mouth 3 (three) times daily with meals. 90 tablet 0  . JANUVIA 25 MG tablet TAKE ONE TABLET BY MOUTH EVERY DAY 90 tablet 0  . losartan (COZAAR) 100 MG tablet TAKE ONE TABLET EVERY DAY 90 tablet 0  . metoprolol tartrate (LOPRESSOR) 25 MG tablet TAKE 1 TABLET BY MOUTH TWICE DAILY 180 tablet 1  . omeprazole (PRILOSEC) 40 MG capsule TAKE 1 CAPSULE BY MOUTH ONCE DAILY 90 capsule 1  . ONETOUCH ULTRA test strip USE AS DIRECTED 100 each 1  . potassium chloride (KLOR-CON) 10 MEQ tablet Take 1 tablet (10 mEq total) by mouth daily. 90 tablet 0  . tadalafil, PAH, (ADCIRCA) 20 MG tablet Take 2 tablets (40 mg total) by mouth daily. 180 tablet 1  . torsemide (DEMADEX) 20 MG tablet Take 2 tablets (40 mg total) by mouth daily. 180 tablet 0  . TOUJEO SOLOSTAR 300 UNIT/ML Solostar Pen INJECT 50 UNITS INTO THE SKIN DAILY 4.5 mL 2  . venlafaxine (EFFEXOR) 75 MG tablet TAKE ONE TABLET EVERY DAY 90 tablet 0  . VITAMIN D PO Take 1 capsule by mouth daily.    [name] warfarin (COUMADIN) 3 MG tablet TAKE 1 TABLET BY MOUTH ON MONDAY WEDNESDAY AND FRIDAY ALONG WITH 4MG ON THE REMAINING DAYS 30 tablet 0   No current facility-administered medications for this encounter.    Allergies:   Penicillins and Sulfa antibiotics   Social History:  The patient  reports that she has never smoked. She has never used smokeless tobacco. She reports that she does not drink alcohol and does not use drugs.   Family History:  The patient's family history is not on file.   ROS:  Please see the history of present illness.   All other systems are personally reviewed and negative.   Vitals:   01/27/20 1115  BP: 116/60  Pulse: 82  SpO2: 93%  Weight: 113 kg (249 lb 3.2 oz)  Height: _0  (1.651 m)    Exam:   General:  Obese woman wearing O2. No resp  difficulty HEENT: normal Neck: supple. JVP 9-10. Carotids 2+ bilat; no bruits. No lymphadenopathy or thryomegaly appreciated. Cor: PMI nondisplaced. Irregular rate & rhythm. No rubs, gallops or murmurs. Lungs: clear with decreased BS throughout Abdomen: obese soft, nontender, nondistended. No hepatosplenomegaly. No bruits or masses. Good bowel sounds. Extremities: no cyanosis, clubbing, rash, 1+ edema Neuro: alert & orientedx3, cranial nerves grossly intact. moves all 4 extremities w/o difficulty. Affect pleasant  Recent Labs: 06/23/2019: Ann Natriuretic Peptide 549.8 01/18/2020: ALT 9; BUN 51; Creatinine, Ser 1.65; Hemoglobin 9.4; Platelets 256; Potassium 4.8; Sodium 141  Personally reviewed   Wt Readings from Last 3 Encounters:  01/27/20 113 kg (249 lb 3.2 oz)  01/18/20 115.2 kg (254 lb)  11/05/19 113.4 kg (250 lb)    ECG: AF 67 Personally reviewed  ASSESSMENT AND PLAN:  1. Pulmonary HTN (severe) with cor pulmonale - suspect this is really WHO Group 3 disease due to OSA/OHS but given markedly elevate PVR may also have component of WHO Group I & IV - cath results from 12/20 - PA pressures now down 25% from previous. PVR low.  - will continue tadalafil 40 and letairis 5 (unable to tolerate higher dose of letairis). With low PVR would not add selxipag - PFTs with significant restrictive lung physiology - Echo 07/07/19: EF 55-60% RV mildly dilated with norma function. No RVSP calculated. BNP 549 - VQ 07/07/19: No PE - continue O2 supplementation and CPAP - Stable NYHA III - Stressed need to lose weight with [employer].  - Continue pulmonary Rehab - Volume status up. Will switch Januvia to Jardiance for help with diuresis.   2. Chronic diastolic HF - Volume status up. Will switch Januvia to Jardiance for help with diuresis.   3. DM2 - given HF and CKD 3b with volume overload will switch Januvia to Jardiance 10 - d/w PCP  4. CKD 4 - last creatinine 1.65 on 01/18/20 (eGFR  30) - recheck today 5. Nonobstructive CAD - No s/s angina - Continue statin. Off ASA with coumadin  6. Morbid obesity - stressed need for weight loss as above  7. H/o PE 2009 - on warfarin.  - VQ 11/20 negative - Does not want to switch to DOAC due to cost  8. HTN - BP improved with amlodipine  9. Paroxysmal AF - new diagnosis - rate controlled.  - last INR 1.6 - d/w PCP. They will follow INRs once we have INR > 2 for 4 weeks will plan DC-CV. She is at high risk for reverting back to AF but will give her one try at maintaining NSR.  - will place Zio to check if AF is continuous vs PAF.  Total time spent 35 minutes. Over half that time spent discussing above.    Signed, Glori Bickers, MD  01/27/2020 11:22 AM  Advanced Heart Failure [place] [address] [phone] (office) [phone] (fax)

## 2020-01-27 NOTE — Telephone Encounter (Signed)
Hi Can we please put Ann Flynn on the PT/INR clinic once per week for the next four weeks at least? We will need her INR to be > 2.0 for four weeks before she can be cardioverted for atrial fibrillation. Thank you.

## 2020-01-27 NOTE — Progress Notes (Signed)
Zio patch placed onto patient.  All instructions and information reviewed with patient, they verbalize understanding with no questions.

## 2020-01-27 NOTE — Telephone Encounter (Signed)
-----  Message from Jolaine Artist, MD sent at 01/27/2020  3:04 PM EDT ----- Repeat BMET next week. Stop potassium

## 2020-01-27 NOTE — Telephone Encounter (Signed)
Pt aware, agreeable, and verbalized understanding, she will go to [facility] for labs next week

## 2020-01-30 ENCOUNTER — Ambulatory Visit: Payer: Medicare Other | Admitting: Primary Care

## 2020-01-30 ENCOUNTER — Other Ambulatory Visit: Payer: Self-pay

## 2020-01-30 ENCOUNTER — Encounter: Payer: Self-pay | Admitting: Primary Care

## 2020-01-30 VITALS — BP 120/60 | HR 119 | Temp 98.1°F | Ht 63.0 in | Wt 247.0 lb

## 2020-01-30 DIAGNOSIS — I509 Heart failure, unspecified: Secondary | ICD-10-CM

## 2020-01-30 DIAGNOSIS — I270 Primary pulmonary hypertension: Secondary | ICD-10-CM | POA: Diagnosis not present

## 2020-01-30 DIAGNOSIS — R0602 Shortness of breath: Secondary | ICD-10-CM | POA: Diagnosis not present

## 2020-01-30 DIAGNOSIS — G4730 Sleep apnea, unspecified: Secondary | ICD-10-CM | POA: Diagnosis not present

## 2020-01-30 DIAGNOSIS — I2782 Chronic pulmonary embolism: Secondary | ICD-10-CM

## 2020-01-30 DIAGNOSIS — R918 Other nonspecific abnormal finding of lung field: Secondary | ICD-10-CM

## 2020-01-30 NOTE — Progress Notes (Signed)
_0  ID: Ann Flynn, female    DOB: Aug 06, 1941, 79 y.o.   MRN: [id number]  Chief Complaint  Patient presents with   Follow-up    redness on nose from nasal mask.  wearing heart monitor for 2 weeks.  skipping on EKG.    Referring provider: Paulene Floor  HPI: 79 year old female. PMH significant for sleep apnea, chronic respiratory failure, diabetes, CKD, CHF, moderate pulmonary hypertension, pulmonary embolism in 2009, history of stroke, morbid obesity. Patient of Dr. Mortimer Fries, last seen for televisit on 06/24/2019.  Sleep study in 2016, AHI 29.6.  Patient had a CPAP Pap titration study, CPAP was titrated to 13 cm H2O with 3-6 L supplemental oxygen. On indefinite oral anticoagulation.   01/30/2020 Patient presents today for 6 month follow-up.  Patient reports progressive chronic shortness of breath. She has noticed an improvement in her breathing with new diuretic.   CT chest 01/11/2020 showed stable bilateral pulmonary nodules November 2020 superimposed on chronic granulomatous disease, bilateral lung scarring and chronic pulmonary arterial hypertension. New trace bilateral pleural effusions. Cardiomegaly with advanced calcified coronary artery and aortic arthrosclerosis.   She saw cardiology last week. She is currently wearing a heart monitor. She is torsemide 47m daily, this was changed from lasix a couple of weeks ago. Potassium was discontinued. She has noticed an improvement. Her weight is down 247 lbs from 249lbs from last week.   She is compliant with CPAP wearing 30/30 days. Sleeping well. Pressure 11cm h20. She wears a nasal mask, no issues with pressure or air leaks. She wears 3L oxygen at rest and 4L with exercise. She is still going to pulmonary rehab for total of 32 or 36 sessions. She is taking coumadin as prescribed for DVT/PE. She had a recent lower extremity ultrasound for left leg swelling which was negative for DVT. Denies chest pain, wheezing or cough.    Allergies  Allergen Reactions   Penicillins Swelling and Rash    Facial swelling Did it involve swelling of the face/tongue/throat, SOB, or low BP? Yes Did it involve sudden or severe rash/hives, skin peeling, or any reaction on the inside of your mouth or nose? No Did you need to seek medical attention at a hospital or doctor's office? No When did it last happen?10 + years If all above answers are NO, may proceed with cephalosporin use.    Sulfa Antibiotics Rash    Immunization History  Administered Date(s) Administered   Fluad Quad(high Dose 65+) 07/06/2019   Influenza-Unspecified 06/28/2018   PFIZER SARS-COV-2 Vaccination 10/17/2019, 11/07/2019    No past medical history on file.  Tobacco History: Social History   Tobacco Use  Smoking Status Never Smoker  Smokeless Tobacco Never Used   Counseling given: Not Answered   Outpatient Medications Prior to Visit  Medication Sig Dispense Refill   acetaminophen (TYLENOL) 500 MG tablet Take 500 mg by mouth 2 (two) times daily as needed for moderate pain.     ambrisentan (LETAIRIS) 5 MG tablet Take 1 tablet (5 mg total) by mouth daily. 90 tablet 1   amLODipine (NORVASC) 5 MG tablet TAKE ONE TABLET EVERY DAY 30 tablet 5   atorvastatin (LIPITOR) 80 MG tablet TAKE 1 TABLET BY MOUTH DAILY 90 tablet 2   BD PEN NEEDLE NANO U/F 32G X 4 MM MISC USE TO INJECT ONCE DAILY AS DIRECTED 100 each 2   Cyanocobalamin (B-12 PO) Take 1 tablet by mouth daily.     docusate sodium (COLACE) 100 MG capsule Take  100 mg by mouth at bedtime.     Dulaglutide (TRULICITY) 1.61 WR/6.0AV SOPN Inject 0.75 mg into the skin once a week. 4 pen 0   empagliflozin (JARDIANCE) 10 MG TABS tablet Take 1 tablet (10 mg total) by mouth daily before breakfast. 30 tablet 1   ferrous sulfate 325 (65 FE) MG EC tablet Take 1 tablet (325 mg total) by mouth 3 (three) times daily with meals. 90 tablet 0   losartan (COZAAR) 100 MG tablet TAKE ONE TABLET  EVERY DAY 90 tablet 0   metoprolol tartrate (LOPRESSOR) 25 MG tablet TAKE 1 TABLET BY MOUTH TWICE DAILY 180 tablet 1   omeprazole (PRILOSEC) 40 MG capsule TAKE 1 CAPSULE BY MOUTH ONCE DAILY 90 capsule 1   ONETOUCH ULTRA test strip USE AS DIRECTED 100 each 1   tadalafil, PAH, (ADCIRCA) 20 MG tablet Take 2 tablets (40 mg total) by mouth daily. 180 tablet 1   torsemide (DEMADEX) 20 MG tablet Take 2 tablets (40 mg total) by mouth daily. 180 tablet 0   TOUJEO SOLOSTAR 300 UNIT/ML Solostar Pen INJECT 50 UNITS INTO THE SKIN DAILY 4.5 mL 2   venlafaxine (EFFEXOR) 75 MG tablet TAKE ONE TABLET EVERY DAY 90 tablet 0   VITAMIN D PO Take 1 capsule by mouth daily.     warfarin (COUMADIN) 3 MG tablet TAKE 1 TABLET BY MOUTH ON MONDAY WEDNESDAY AND FRIDAY ALONG WITH 4MG ON THE REMAINING DAYS 30 tablet 0   No facility-administered medications prior to visit.   Review of Systems  Review of Systems  Respiratory: Negative for cough, shortness of breath and wheezing.   Cardiovascular: Positive for leg swelling.   Physical Exam  BP 120/60 (BP Location: Left Arm, Cuff Size: Large)    Pulse (!) 119    Temp 98.1 F (36.7 C) (Oral)    Ht _0  (1.6 m)    Wt 247 lb (112 kg)    SpO2 91%    BMI 43.75 kg/m  Physical Exam Constitutional:      Appearance: Normal appearance. She is obese. She is not ill-appearing.  HENT:     Head: Normocephalic and atraumatic.  Cardiovascular:     Rate and Rhythm: Tachycardia present. Rhythm irregular.     Comments: +2 BLE/pedal edema Pulmonary:     Effort: Pulmonary effort is normal.     Breath sounds: Normal breath sounds. No wheezing or rhonchi.     Comments: CTA, diminished  Musculoskeletal:     Comments: In transport wheelchair   Skin:    General: Skin is warm and dry.  Neurological:     Mental Status: She is alert.  Psychiatric:        Mood and Affect: Mood normal.        Behavior: Behavior normal.        Thought Content: Thought content normal.         Judgment: Judgment normal.      Lab Results:  CBC    Component Value Date/Time   WBC 6.0 01/18/2020 1155   WBC 5.5 11/05/2019 1203   RBC 3.38 (L) 01/18/2020 1155   RBC 3.61 (L) 11/05/2019 1203   HGB 9.4 (L) 01/18/2020 1155   HCT 31.0 (L) 01/18/2020 1155   PLT 256 01/18/2020 1155   MCV 92 01/18/2020 1155   MCH 27.8 01/18/2020 1155   MCH 28.8 11/05/2019 1203   MCHC 30.3 (L) 01/18/2020 1155   MCHC 30.1 11/05/2019 1203   RDW 15.3 01/18/2020 1155  LYMPHSABS 1.0 01/18/2020 1155   EOSABS 0.2 01/18/2020 1155   BASOSABS 0.1 01/18/2020 1155    BMET    Component Value Date/Time   NA 142 01/27/2020 1150   NA 141 01/18/2020 1155   K 5.2 (H) 01/27/2020 1150   CL 105 01/27/2020 1150   CO2 27 01/27/2020 1150   GLUCOSE 155 (H) 01/27/2020 1150   BUN 59 (H) 01/27/2020 1150   BUN 51 (H) 01/18/2020 1155   CREATININE 2.13 (H) 01/27/2020 1150   CALCIUM 8.7 (L) 01/27/2020 1150   GFRNONAA 22 (L) 01/27/2020 1150   GFRAA 25 (L) 01/27/2020 1150    BNP    Component Value Date/Time   BNP 450.6 (H) 01/27/2020 1150    ProBNP No results found for: PROBNP  Imaging: CT CHEST WO CONTRAST  Result Date: 01/11/2020 CLINICAL DATA:  79 year old female with progressive chronic shortness of breath. Pulmonary nodules and/or scarring on CT in November with follow-up recommended. EXAM: CT CHEST WITHOUT CONTRAST TECHNIQUE: Multidetector CT imaging of the chest was performed following the standard protocol without IV contrast. COMPARISON:  Chest CT without contrast 06/28/2019. FINDINGS: Cardiovascular: Calcified aortic atherosclerosis. Extensive calcified coronary artery atherosclerosis again noted. Vascular patency is not evaluated in the absence of IV contrast. Stable cardiomegaly. No pericardial effusion. Chronic enlargement of the central pulmonary arteries (up to 45 mm diameter main pulmonary artery as compared to 32 mm diameter adjacent ascending aorta). Mediastinum/Nodes: Numerous small and calcified  mediastinal and bilateral hilar lymph nodes. Stable increased number of small mediastinal nodes. No axillary lymphadenopathy. No lymphadenopathy is evident at the visible thoracic inlet. Lungs/Pleura: A trace left pleural effusion is now superimposed on the trace fluid or pleural thickening present in November. Trace right pleural fluid also. Major airways are patent. Multiple calcified granulomas in both lungs, with superimposed areas of mild architectural distortion (lingula) and underlying bilateral mosaic attenuation. The largest lesion in the medial right lower lobe which is partially calcified (series 3, image 102) appears stable. Small noncalcified lung nodules in the left upper lobe (series 3, image 57), left lower lobe (subpleural nodule series 3, image 119), and right upper lobe (image 36) remain stable. Upper Abdomen: There are occasional calcified granulomas in the liver and spleen. Surgically absent gallbladder. The 17 mm round low-density area in the spleen appears unchanged on series 2, image 152, favoring benign etiology. Negative visible adrenal glands and bowel. There is some chronic renal scarring suspected and stable. Musculoskeletal: Sequelae of right side mastectomy and reconstruction. Degenerative osseous changes in the thoracic spine and at the right sternoclavicular joint. Chronic left lateral 8th and 9th rib fractures. No acute osseous abnormality identified. IMPRESSION: 1. Stable bilateral pulmonary nodules seen in November, superimposed on sequelae of chronic granulomatous disease, bilateral lung scarring, and evidence of chronic Pulmonary Artery Hypertension which likely explains underlying pulmonary mosaic attenuation on the basis of pulmonary small vessel disease. One additional follow-up Chest CT in June or November 2022 (18-24 months from the initial scan) is optional for low-risk patients, but is recommended for high-risk patients (Guidelines for Management of Incidental Pulmonary  Nodules Detected on CT Images: From the Fleischner Society 2017; Radiology 2017; [phone]). 2. Trace bilateral pleural effusions are new. 3. Superimposed Cardiomegaly with advanced calcified coronary artery and Aortic Atherosclerosis (ICD10-I70.0). 4. Stable visible upper abdominal viscera including a probably benign solitary small cystic area in the spleen. Electronically Signed   By: Genevie Ann M.D.   On: 01/11/2020 16:17   US Venous Img Lower Unilateral Left  Result Date: 01/18/2020 CLINICAL DATA:  Left lower extremity edema. History of breast cancer and pulmonary embolism, currently on anticoagulation. EXAM: LEFT LOWER EXTREMITY VENOUS DOPPLER ULTRASOUND TECHNIQUE: Gray-scale sonography with graded compression, as well as color Doppler and duplex ultrasound were performed to evaluate the lower extremity deep venous systems from the level of the common femoral vein and including the common femoral, femoral, profunda femoral, popliteal and calf veins including the posterior tibial, peroneal and gastrocnemius veins when visible. The superficial great saphenous vein was also interrogated. Spectral Doppler was utilized to evaluate flow at rest and with distal augmentation maneuvers in the common femoral, femoral and popliteal veins. COMPARISON:  None. FINDINGS: Contralateral Common Femoral Vein: Respiratory phasicity is normal and symmetric with the symptomatic side. No evidence of thrombus. Normal compressibility. Common Femoral Vein: No evidence of thrombus. Normal compressibility, respiratory phasicity and response to augmentation. Saphenofemoral Junction: No evidence of thrombus. Normal compressibility and flow on color Doppler imaging. Profunda Femoral Vein: No evidence of thrombus. Normal compressibility and flow on color Doppler imaging. Femoral Vein: No evidence of thrombus. Normal compressibility, respiratory phasicity and response to augmentation. Popliteal Vein: No evidence of thrombus. Normal  compressibility, respiratory phasicity and response to augmentation. Calf Veins: No evidence of thrombus. Normal compressibility and flow on color Doppler imaging. Superficial Great Saphenous Vein: No evidence of thrombus. Normal compressibility. Venous Reflux:  None. Other Findings:  None. IMPRESSION: No evidence of DVT within the left lower extremity. Electronically Signed   By: Sandi Mariscal M.D.   On: 01/18/2020 13:29     Assessment & Plan:   79 year old female with chronic hypoxic respiratory failure secondary to pulmonary hypertension GROUP III compliant with Cpap AND wears 3 L oxygen at rest and 4 L on exertion.  Lung nodules seen on CT chest in February 2019, PET negative.  Most recent CT chest showed stable bilateral pulmonary nodules since November 2020 superimposed on chronic granulomatous disease, bilateral lung scarring and chronic pulmonary arterial hypertension. New trace bilateral pleural effusions. Cardiomegaly with advanced calcified coronary artery and aortic arthrosclerosis. She continues to attend cardiopulmonary rehab. Lasix changed to Torsemide 56m daily; Januvia recently changed to Jardiance recently by cardiology to help assist with diuresis.    Sleep apnea - Patient is 100% compliant with CPAP use using 30/30 days with average 5 1/2 hours  - Pressure 11cm h20; AHI 0.3 - No change to current pressure setting  Pulmonary hypertension, primary (HCC) - WHO GROUP III d/t OSA/OHS, possible component of group I and group IV - Reports improvement in shortness of breath since changing from Lasix to Torsemide.  - Continue torsemide 40 mg daily, Letairis 5 mg daily and Tadalafil 20 mg daily - Recommend patient follow low-sodium diet - Monitor daily weights, report weight gain of >2lbs pounds in 1 day or >5 pounds in a week  CHF (congestive heart failure) ([place] - Following with cardiology, saw Dr. BJeffie Pollocklast week who felt she was retaining fluid. - She was started on Jardiance  last week to help assist with diuresis - Continue to encourage weight loss    Morbid (severe) obesity due to excess calories ([place] - Refer to healthy weight and wellness   Pulmonary nodules - Lung nodules seen on CT chest in February 2019, PET negative.  - CT chest 01/11/2020 showed stable bilateral pulmonary nodules November 2020 superimposed on chronic granulomatous disease, bilateral lung scarring   Chronic pulmonary embolism, unspecified pulmonary embolism type, unspecified whether acute cor pulmonale present ([place] -  Dx 2009, on chronic anticoagulation with coumadin    Martyn Ehrich, NP 01/30/2020

## 2020-01-30 NOTE — Assessment & Plan Note (Signed)
-  Lung nodules seen on CT chest in February 2019, PET negative.  - CT chest 01/11/2020 showed stable bilateral pulmonary nodules November 2020 superimposed on chronic granulomatous disease, bilateral lung scarring

## 2020-01-30 NOTE — Assessment & Plan Note (Addendum)
-  Patient is 100% compliant with CPAP use using 30/30 days with average 5 1/2 hours  - Pressure 11cm h20; AHI 0.3 - No change to current pressure setting

## 2020-01-30 NOTE — Assessment & Plan Note (Signed)
-  Refer to healthy weight and wellness

## 2020-01-30 NOTE — Assessment & Plan Note (Signed)
-  Following with cardiology, saw Dr. Jeffie Pollock last week who felt she was retaining fluid. - She was started on Jardiance last week to help assist with diuresis - Continue to encourage weight loss

## 2020-01-30 NOTE — Assessment & Plan Note (Addendum)
-  WHO GROUP III d/t OSA/OHS, possible component of group I and group IV - Reports improvement in shortness of breath since changing from Lasix to Torsemide.  - Continue torsemide 40 mg daily, Letairis 5 mg daily and Tadalafil 20 mg daily - Recommend patient follow low-sodium diet - Monitor daily weights, report weight gain of >2lbs pounds in 1 day or >5 pounds in a week

## 2020-01-30 NOTE — Patient Instructions (Addendum)
CT chest 01/11/2020 showed stable bilateral pulmonary nodules November 2020 superimposed on chronic granulomatous disease, bilateral lung scarring and chronic pulmonary arterial hypertension. New trace bilateral pleural effusions.  Cardiomegaly with advanced calcified coronary artery and aortic arthrosclerosis.  Heart failure/pulmonary hypertension: Continue torsemide 40 mg daily Continue Letairis 5 mg daily Continue Tadalafil 20 mg daily Recommend low-sodium diet Monitor daily weights, report increase 2 pounds in 1 day or greater than 5 pounds in a week  Obstructive of sleep apnea: Continue CPAP every night for minimum 4 to 6 hours or more No changes to pressure settings at this time Not drive if experiencing excessive daytime fatigue or somnolence  Chronic respiratory failure: Continue 3 L of oxygen at rest and 4 L on exertion Continue 4 L oxygen with CPAP at night  Referral: Healthy weight and wellness  Follow-up: Dr. Mortimer Fries in 6 months or sooner if needed   Sleep Apnea Sleep apnea affects breathing during sleep. It causes breathing to stop for a short time or to become shallow. It can also increase the risk of:  Heart attack.  Stroke.  Being very overweight (obese).  Diabetes.  Heart failure.  Irregular heartbeat. The goal of treatment is to help you breathe normally again. What are the causes? There are three kinds of sleep apnea:  Obstructive sleep apnea. This is caused by a blocked or collapsed airway.  Central sleep apnea. This happens when the brain does not send the right signals to the muscles that control breathing.  Mixed sleep apnea. This is a combination of obstructive and central sleep apnea. The most common cause of this condition is a collapsed or blocked airway. This can happen if:  Your throat muscles are too relaxed.  Your tongue and tonsils are too large.  You are overweight.  Your airway is too small. What increases the risk?  Being  overweight.  Smoking.  Having a small airway.  Being older.  Being female.  Drinking alcohol.  Taking medicines to calm yourself (sedatives or tranquilizers).  Having family members with the condition. What are the signs or symptoms?  Trouble staying asleep.  Being sleepy or tired during the day.  Getting angry a lot.  Loud snoring.  Headaches in the morning.  Not being able to focus your mind (concentrate).  Forgetting things.  Less interest in sex.  Mood swings.  Personality changes.  Feelings of sadness (depression).  Waking up a lot during the night to pee (urinate).  Dry mouth.  Sore throat. How is this diagnosed?  Your medical history.  A physical exam.  A test that is done when you are sleeping (sleep study). The test is most often done in a sleep lab but may also be done at home. How is this treated?   Sleeping on your side.  Using a medicine to get rid of mucus in your nose (decongestant).  Avoiding the use of alcohol, medicines to help you relax, or certain pain medicines (narcotics).  Losing weight, if needed.  Changing your diet.  Not smoking.  Using a machine to open your airway while you sleep, such as: ? An oral appliance. This is a mouthpiece that shifts your lower jaw forward. ? A CPAP device. This device blows air through a mask when you breathe out (exhale). ? An EPAP device. This has valves that you put in each nostril. ? A BPAP device. This device blows air through a mask when you breathe in (inhale) and breathe out.  Having surgery if  other treatments do not work. It is important to get treatment for sleep apnea. Without treatment, it can lead to:  High blood pressure.  Coronary artery disease.  In men, not being able to have an erection (impotence).  Reduced thinking ability. Follow these instructions at home: Lifestyle  Make changes that your doctor recommends.  Eat a healthy diet.  Lose weight if  needed.  Avoid alcohol, medicines to help you relax, and some pain medicines.  Do not use any products that contain nicotine or tobacco, such as cigarettes, e-cigarettes, and chewing tobacco. If you need help quitting, ask your doctor. General instructions  Take over-the-counter and prescription medicines only as told by your doctor.  If you were given a machine to use while you sleep, use it only as told by your doctor.  If you are having surgery, make sure to tell your doctor you have sleep apnea. You may need to bring your device with you.  Keep all follow-up visits as told by your doctor. This is important. Contact a doctor if:  The machine that you were given to use during sleep bothers you or does not seem to be working.  You do not get better.  You get worse. Get help right away if:  Your chest hurts.  You have trouble breathing in enough air.  You have an uncomfortable feeling in your back, arms, or stomach.  You have trouble talking.  One side of your body feels weak.  A part of your face is hanging down. These symptoms may be an emergency. Do not wait to see if the symptoms will go away. Get medical help right away. Call your local emergency services (911 in the [place] Do not drive yourself to the hospital. Summary  This condition affects breathing during sleep.  The most common cause is a collapsed or blocked airway.  The goal of treatment is to help you breathe normally while you sleep. This information is not intended to replace advice given to you by your health care provider. Make sure you discuss any questions you have with your health care provider. Document Revised: 05/21/2018 Document Reviewed: 03/30/2018 Elsevier Patient Education  [place].  Heart Failure, Diagnosis  Heart failure means that your heart is not able to pump blood in the right way. This makes it hard for your body to work well. Heart failure is usually a long-term (chronic)  condition. You must take good care of yourself and follow your treatment plan from your doctor. What are the causes? This condition may be caused by:  High blood pressure.  Build up of cholesterol and fat in the arteries.  Heart attack. This injures the heart muscle.  Heart valves that do not open and close properly.  Damage of the heart muscle. This is also called cardiomyopathy.  Lung disease.  Abnormal heart rhythms. What increases the risk? The risk of heart failure goes up as a person ages. This condition is also more likely to develop in people who:  Are overweight.  Are female.  Smoke or chew tobacco.  Abuse alcohol or illegal drugs.  Have taken medicines that can damage the heart.  Have diabetes.  Have abnormal heart rhythms.  Have thyroid problems.  Have low blood counts (anemia). What are the signs or symptoms? Symptoms of this condition include:  Shortness of breath.  Coughing.  Swelling of the feet, ankles, legs, or belly.  Losing weight for no reason.  Trouble breathing.  Waking from sleep because  of the need to sit up and get more air.  Rapid heartbeat.  Being very tired.  Feeling dizzy, or feeling like you may pass out (faint).  Having no desire to eat.  Feeling like you may vomit (nauseous).  Peeing (urinating) more at night.  Feeling confused. How is this treated?     This condition may be treated with:  Medicines. These can be given to treat blood pressure and to make the heart muscles stronger.  Changes in your daily life. These may include eating a healthy diet, staying at a healthy body weight, quitting tobacco and illegal drug use, or doing exercises.  Surgery. Surgery can be done to open blocked valves, or to put devices in the heart, such as pacemakers.  A donor heart (heart transplant). You will receive a healthy heart from a donor. Follow these instructions at home:  Treat other conditions as told by your doctor.  These may include high blood pressure, diabetes, thyroid disease, or abnormal heart rhythms.  Learn as much as you can about heart failure.  Get support as you need it.  Keep all follow-up visits as told by your doctor. This is important. Summary  Heart failure means that your heart is not able to pump blood in the right way.  This condition is caused by high blood pressure, heart attack, or damage of the heart muscle.  Symptoms of this condition include shortness of breath and swelling of the feet, ankles, legs, or belly. You may also feel very tired or feel like you may vomit.  You may be treated with medicines, surgery, or changes in your daily life.  Treat other health conditions as told by your doctor. This information is not intended to replace advice given to you by your health care provider. Make sure you discuss any questions you have with your health care provider. Document Revised: 10/22/2018 Document Reviewed: 10/22/2018 Elsevier Patient Education  [place].  Pulmonary Hypertension Pulmonary hypertension is a long-term (chronic) condition in which there is high blood pressure in the arteries in the lungs (pulmonary arteries). This condition occurs when pulmonary arteries become narrow and tight, making it harder for blood to flow through the lungs. This in turn makes the heart work harder to pump blood through the lungs, making it harder for you to breathe. Over time, pulmonary hypertension can weaken and damage the heart muscle, specifically the right side of the heart. Pulmonary hypertension is a serious condition that can be life-threatening. What are the causes? This condition may be caused by different medical conditions. It can be categorized by cause into five groups:  Group 1: Pulmonary hypertension that is caused by abnormal growth of small blood vessels in the lungs (pulmonary arterial hypertension). The abnormal blood vessel growth may have no known cause,  or it may be: ? Passed from parent to child (hereditary). ? Caused by another disease, such as a connective tissue disease (including lupus or scleroderma), congenital heart disease, liver disease, or HIV. ? Caused by certain medicines or poisons (toxins).  Group 2: Pulmonary hypertension that is caused by weakness of the left chamber of the heart (left ventricle) or heart valve disease.  Group 3: Pulmonary hypertension that is caused by lung disease or low oxygen levels. Causes in this group include: ? Emphysema or chronic obstructive pulmonary disease (COPD). ? Untreated sleep apnea. ? Pulmonary fibrosis. ? Long-term exposure to high altitudes in certain people who may already be at higher risk for pulmonary hypertension.  Group 4: Pulmonary hypertension that is caused by blood clots in the lungs (pulmonary emboli).  Group 5: Other causes of pulmonary hypertension, such as sickle cell anemia, sarcoidosis, tumors pressing on the pulmonary arteries, and various other diseases. What are the signs or symptoms? Symptoms of this condition include:  Shortness of breath. You may notice shortness of breath with: ? Activity, such as walking. ? Minimal activity, such as getting dressed. ? No activity, like when you are sitting still.  A cough. Sometimes, bloody mucus from the lungs may be coughed up (hemoptysis).  Tiredness and fatigue.  Dizziness, lightheadedness, or fainting, especially with physical activity.  Rapid heartbeat, or feeling your heart flutter or skip a beat (palpitations).  Veins in the neck getting larger.  Swelling of the lower legs, abdomen, or both.  Bluish color of the lips and fingertips.  Chest pain or tightness in the chest.  Abdominal pain, especially in the upper abdomen. How is this diagnosed? This condition may be diagnosed based on one or more of the following tests:  Chest X-ray.  Blood tests.  CT scan.  Pulmonary function test. This test  measures how much air your lungs can hold. It also tests how well air moves in and out of your lungs.  6-minute walk test. This tests how severe your condition is in relation to your activity levels.  Electrocardiogram (ECG). This test records the electrical impulses of the heart.  Echocardiogram. This test uses sound waves (ultrasound) to produce an image of the heart.  Cardiac catheterization. This is a procedure in which a thin tube (catheter) is passed into the pulmonary artery and used to test the pressure in your pulmonary artery and the right side of your heart.  Lung biopsy. This involves having a procedure to remove a small sample of lung tissue for testing. This may help determine an underlying cause of your pulmonary hypertension. How is this treated? There is no cure for this condition, but treatment can help to relieve symptoms and slow the progress of the condition. Treatment may include:  Cardiac rehabilitation. This is a treatment program that includes exercise training, education, and counseling to help you get stronger and return to an active lifestyle.  Oxygen therapy.  Medicines that: ? Lower blood pressure. ? Relax (dilate) the pulmonary blood vessels. ? Help the heart beat more efficiently and pump more blood. ? Help the body get rid of extra fluid (diuretics). ? Thin the blood in order to prevent blood clots in the lungs.  Lung surgery to relieve pressure on the heart, for severe cases that do not respond to medical treatment.  Heart-lung transplant, or lung transplant. This may be done in very severe cases. Follow these instructions at home: Eating and drinking   Eat a healthy diet that includes plenty of fresh fruits and vegetables, whole grains, and beans.  Limit your salt (sodium) intake to less than 2,300 mg a day. Lifestyle  Do not use any products that contain nicotine or tobacco, such as cigarettes and e-cigarettes. If you need help quitting, ask  your health care provider.  Avoid secondhand smoke. Activity  Get plenty of rest.  Exercise as directed. Talk with your health care provider about what type of exercise is safe for you.  Avoid hot tubs and saunas.  Avoid high altitudes. General instructions  Take over-the-counter and prescription medicines only as told by your health care provider. Do not change or stop medicines without checking with your health care provider.  Stay up to date on your vaccines, especially yearly flu (influenza) and pneumonia vaccines.  If you are a woman of child-bearing age, avoid becoming pregnant. Talk with your health care provider about birth control.  Consider ways to get support for anxiety and stress of living with pulmonary hypertension. Talk with your health care provider about support groups and online resources.  Use oxygen therapy at home as directed.  Keep track of your weight. Weight gain could be a sign that your condition is getting worse.  Keep all follow-up visits as told by your health care provider. This is important. Contact a health care provider if:  Your cough gets worse.  You have more shortness of breath than usual, or you start to have trouble doing activities that you could do before.  You need to use medicines or oxygen more frequently or in higher dosages than usual. Get help right away if:  You have severe shortness of breath.  You have chest pain or pressure.  You cough up blood.  You have swelling of your feet or legs that gets worse.  You have rapid weight gain over a period of 1-2 days.  Your medicines or oxygen do not provide relief. Summary  Pulmonary hypertension is a chronic condition in which there is high blood pressure in the arteries in the lungs (pulmonary arteries).  Pulmonary hypertension is a serious condition that can be life-threatening. It can be caused by a variety of illnesses.  Treatment may involve taking medicines and using  oxygen therapy. Severe cases may require surgery or a transplant. This information is not intended to replace advice given to you by your health care provider. Make sure you discuss any questions you have with your health care provider. Document Revised: 07/17/2017 Document Reviewed: 10/28/2016 Elsevier Patient Education  [place].

## 2020-01-30 NOTE — Assessment & Plan Note (Addendum)
-  Dx 2009, on chronic anticoagulation with coumadin

## 2020-01-31 ENCOUNTER — Encounter: Payer: Self-pay | Admitting: Physician Assistant

## 2020-01-31 ENCOUNTER — Encounter (HOSPITAL_COMMUNITY): Payer: Self-pay

## 2020-01-31 ENCOUNTER — Encounter: Payer: Medicare Other | Admitting: *Deleted

## 2020-01-31 ENCOUNTER — Telehealth (INDEPENDENT_AMBULATORY_CARE_PROVIDER_SITE_OTHER): Payer: Medicare Other

## 2020-01-31 DIAGNOSIS — D500 Iron deficiency anemia secondary to blood loss (chronic): Secondary | ICD-10-CM

## 2020-01-31 DIAGNOSIS — I270 Primary pulmonary hypertension: Secondary | ICD-10-CM | POA: Diagnosis not present

## 2020-01-31 DIAGNOSIS — K921 Melena: Secondary | ICD-10-CM

## 2020-01-31 DIAGNOSIS — I2782 Chronic pulmonary embolism: Secondary | ICD-10-CM

## 2020-01-31 DIAGNOSIS — I272 Pulmonary hypertension, unspecified: Secondary | ICD-10-CM

## 2020-01-31 LAB — HEMOCCULT GUIAC POC 1CARD (OFFICE)
Card #2 Fecal Occult Blod, POC: POSITIVE
Card #3 Fecal Occult Blood, POC: POSITIVE
Fecal Occult Blood, POC: POSITIVE — AB

## 2020-01-31 NOTE — Progress Notes (Signed)
Daily Session Note  Patient Details  Name: Ann Flynn MRN: [id number] Date of Birth: 06-28-1941 Referring Provider:     Pulmonary Rehab from 09/22/2019 in [facility]  Referring Provider Glori Bickers MD      Encounter Date: 01/31/2020  Check In:  Session Check In - 01/31/20 1131      Check-In   Supervising physician immediately available to respond to emergencies See telemetry face sheet for immediately available ER MD    Location [facility]-Cardiac & Pulmonary Re[facility]    Staff Present Heath Lark, RN, BSN, CCRP;Joseph Hood RCP,RRT,BSRT;Amanda Vega, [place], ACSM CEP, Exercise Physiologist    Virtual Visit No    Medication changes reported     Yes    Comments Januvia and potassium stopped  Jardiance started    Fall or balance concerns reported    No    Warm-up and Cool-down Performed on first and last piece of equipment    Resistance Training Performed Yes    VAD Patient? No    PAD/SET Patient? No      Pain Assessment   Currently in Pain? No/denies              Social History   Tobacco Use  Smoking Status Never Smoker  Smokeless Tobacco Never Used    Goals Met:  Proper associated with RPD/PD & O2 Sat Independence with exercise equipment Exercise tolerated well No report of cardiac concerns or symptoms  Goals Unmet:  Not Applicable  Comments: Pt able to follow exercise prescription today without complaint.  Will continue to monitor for progression. Aerith is wearing a heart monitor 6/11 to 6/25  Doctor "saw a blip on my EKG"  Monitor to see if more.     Dr. Emily Filbert is Medical Director for [facility] and [facility].

## 2020-01-31 NOTE — Telephone Encounter (Signed)
Patient was given 3 hemo occult cards to test for blood in stool. Patient brought cards back today,01/31/2020 and all the cards was positive for blood. The dates collected was 01/25/2020,01/26/2020,01/30/2020. Ann Flynn's patient please advise.

## 2020-01-31 NOTE — Telephone Encounter (Signed)
Will refer to gastroenterology

## 2020-01-31 NOTE — Telephone Encounter (Signed)
Patient was advised and scheduled for the PT/INR clinic for the next 4 weeks.

## 2020-02-01 ENCOUNTER — Encounter: Payer: Self-pay | Admitting: *Deleted

## 2020-02-01 DIAGNOSIS — I272 Pulmonary hypertension, unspecified: Secondary | ICD-10-CM

## 2020-02-01 NOTE — Progress Notes (Signed)
Pulmonary Individual Treatment Plan  Patient Details  Name: Cosette Prindle MRN: [id number] Date of Birth: [birth year] Referring Provider:     Pulmonary Rehab from 09/22/2019 in [facility] Cardiac and Pulmonary Rehab  Referring Provider Glori Bickers MD      Initial Encounter Date:    Pulmonary Rehab from 09/22/2019 in [facility] Cardiac and Pulmonary Rehab  Date 09/22/19      Visit Diagnosis: Pulmonary hypertension ([place])  Patient's Home Medications on Admission:  Current Outpatient Medications:  .  acetaminophen (TYLENOL) 500 MG tablet, Take 500 mg by mouth 2 (two) times daily as needed for moderate pain., Disp: , Rfl:  .  ambrisentan (LETAIRIS) 5 MG tablet, Take 1 tablet (5 mg total) by mouth daily., Disp: 90 tablet, Rfl: 1 .  amLODipine (NORVASC) 5 MG tablet, TAKE ONE TABLET EVERY DAY, Disp: 30 tablet, Rfl: 5 .  atorvastatin (LIPITOR) 80 MG tablet, TAKE 1 TABLET BY MOUTH DAILY, Disp: 90 tablet, Rfl: 2 .  BD PEN NEEDLE NANO U/F 32G X 4 MM MISC, USE TO INJECT ONCE DAILY AS DIRECTED, Disp: 100 each, Rfl: 2 .  Cyanocobalamin (B-12 PO), Take 1 tablet by mouth daily., Disp: , Rfl:  .  docusate sodium (COLACE) 100 MG capsule, Take 100 mg by mouth at bedtime., Disp: , Rfl:  .  Dulaglutide (TRULICITY) 2.44 WN/0.2VO SOPN, Inject 0.75 mg into the skin once a week., Disp: 4 pen, Rfl: 0 .  empagliflozin (JARDIANCE) 10 MG TABS tablet, Take 1 tablet (10 mg total) by mouth daily before breakfast., Disp: 30 tablet, Rfl: 1 .  ferrous sulfate 325 (65 FE) MG EC tablet, Take 1 tablet (325 mg total) by mouth 3 (three) times daily with meals., Disp: 90 tablet, Rfl: 0 .  losartan (COZAAR) 100 MG tablet, TAKE ONE TABLET EVERY DAY, Disp: 90 tablet, Rfl: 0 .  metoprolol tartrate (LOPRESSOR) 25 MG tablet, TAKE 1 TABLET BY MOUTH TWICE DAILY, Disp: 180 tablet, Rfl: 1 .  omeprazole (PRILOSEC) 40 MG capsule, TAKE 1 CAPSULE BY MOUTH ONCE DAILY, Disp: 90 capsule, Rfl: 1 .  ONETOUCH ULTRA test strip, USE AS DIRECTED, Disp: 100  each, Rfl: 1 .  tadalafil, PAH, (ADCIRCA) 20 MG tablet, Take 2 tablets (40 mg total) by mouth daily., Disp: 180 tablet, Rfl: 1 .  torsemide (DEMADEX) 20 MG tablet, Take 2 tablets (40 mg total) by mouth daily., Disp: 180 tablet, Rfl: 0 .  TOUJEO SOLOSTAR 300 UNIT/ML Solostar Pen, INJECT 50 UNITS INTO THE SKIN DAILY, Disp: 4.5 mL, Rfl: 2 .  venlafaxine (EFFEXOR) 75 MG tablet, TAKE ONE TABLET EVERY DAY, Disp: 90 tablet, Rfl: 0 .  VITAMIN D PO, Take 1 capsule by mouth daily., Disp: , Rfl:  .  warfarin (COUMADIN) 3 MG tablet, TAKE 1 TABLET BY MOUTH ON MONDAY WEDNESDAY AND FRIDAY ALONG WITH 4MG ON THE REMAINING DAYS, Disp: 30 tablet, Rfl: 0  Past Medical History: No past medical history on file.  Tobacco Use: Social History   Tobacco Use  Smoking Status Never Smoker  Smokeless Tobacco Never Used    Labs: Recent Review Flowsheet Data    Labs for ITP Cardiac and Pulmonary Rehab Latest Ref Rng & Units 07/06/2019 07/29/2019 07/29/2019 07/29/2019 01/18/2020   Cholestrol 100 - 199 mg/dL - - - - 104   LDLCALC 0 - 99 mg/dL - - - - 46   HDL >39 mg/dL - - - - 43   Trlycerides 0 - 149 mg/dL - - - - 68   Hemoglobin A1c 4.0 -  5.6 % 7.3(A) - - - 7.3(A)   HCO3 20.0 - 28.0 mmol/L - 26.3 26.7 26.9 -   TCO2 22 - 32 mmol/L - _0 -   O2SAT % - 75.0 74.0 72.0 -       Pulmonary Assessment Scores:  Pulmonary Assessment Scores    Row Name 09/22/19 1543         ADL UCSD   ADL Phase Entry     SOB Score total 42     Rest 0     Walk 2     Stairs 5     Bath 1     Dress 1     Shop 2       CAT Score   CAT Score 22       mMRC Score   mMRC Score 2            UCSD: Self-administered rating of dyspnea associated with activities of daily living (ADLs) 6-point scale (0 = "not at all" to 5 = "maximal or unable to do because of breathlessness")  Scoring Scores range from 0 to 120.  Minimally important difference is 5 units  CAT: CAT can identify the health impairment of COPD patients and is  better correlated with disease progression.  CAT has a scoring range of zero to 40. The CAT score is classified into four groups of low (less than 10), medium (10 - 20), high (21-30) and very high (31-40) based on the impact level of disease on health status. A CAT score over 10 suggests significant symptoms.  A worsening CAT score could be explained by an exacerbation, poor medication adherence, poor inhaler technique, or progression of COPD or comorbid conditions.  CAT MCID is 2 points  mMRC: mMRC (Modified Medical Research Council) Dyspnea Scale is used to assess the degree of baseline functional disability in patients of respiratory disease due to dyspnea. No minimal important difference is established. A decrease in score of 1 point or greater is considered a positive change.   Pulmonary Function Assessment:  Pulmonary Function Assessment - 09/22/19 1543      Breath   Shortness of Breath Yes;Fear of Shortness of Breath;Limiting activity           Exercise Target Goals: Exercise Program Goal: Individual exercise prescription set using results from initial 6 min walk test and THRR while considering  patient's activity barriers and safety.   Exercise Prescription Goal: Initial exercise prescription builds to 30-45 minutes a day of aerobic activity, 2-3 days per week.  Home exercise guidelines will be given to patient during program as part of exercise prescription that the participant will acknowledge.  Education: Aerobic Exercise & Resistance Training: - Gives group verbal and written instruction on the various components of exercise. Focuses on aerobic and resistive training programs and the benefits of this training and how to safely progress through these programs..   Education: Exercise & Equipment Safety: - Individual verbal instruction and demonstration of equipment use and safety with use of the equipment.   Pulmonary Rehab from 09/22/2019 in [facility] Cardiac and Pulmonary Rehab    Date 09/22/19  Educator [facility]  Instruction Review Code 1- Verbalizes Understanding      Education: Exercise Physiology & General Exercise Guidelines: - Group verbal and written instruction with models to review the exercise physiology of the cardiovascular system and associated critical values. Provides general exercise guidelines with specific guidelines to those with heart or lung disease.    Education: Flexibility,  Balance, Mind/Body Relaxation: Provides group verbal/written instruction on the benefits of flexibility and balance training, including mind/body exercise modes such as yoga, pilates and tai chi.  Demonstration and skill practice provided.   Activity Barriers & Risk Stratification:  Activity Barriers & Cardiac Risk Stratification - 09/21/19 0808      Activity Barriers & Cardiac Risk Stratification   Activity Barriers Arthritis;Joint Problems;Other (comment);Deconditioning;Muscular Weakness;Shortness of Breath;Balance Concerns    Comments Right leg/hip flaring up occassionally (possible sciattica)           6 Minute Walk:  6 Minute Walk    Row Name 09/22/19 1534         6 Minute Walk   Phase Initial     Distance 400 feet     Walk Time 5 minutes     # of Rest Breaks 0  stopped at 5 min     MPH 0.91     METS 0.56     RPE 17     Perceived Dyspnea  3     VO2 Peak 1.95     Symptoms Yes (comment)     Comments dizzy, hips hurting 8/10     Resting HR 62 bpm     Resting BP 134/72     Resting Oxygen Saturation  99 %     Exercise Oxygen Saturation  during 6 min walk 86 %     Max Ex. HR 120 bpm     Max Ex. BP 152/84     2 Minute Post BP 134/74       Interval HR   1 Minute HR 95     2 Minute HR 113     3 Minute HR 117     4 Minute HR 119     5 Minute HR 120     6 Minute HR 111     2 Minute Post HR 87     Interval Heart Rate? Yes       Interval Oxygen   Interval Oxygen? Yes     Baseline Oxygen Saturation % 99 %     1 Minute Oxygen Saturation % 95 %     1  Minute Liters of Oxygen 3 L     2 Minute Oxygen Saturation % 94 %     2 Minute Liters of Oxygen 3 L     3 Minute Oxygen Saturation % 91 %     3 Minute Liters of Oxygen 3 L     4 Minute Oxygen Saturation % 85 %     4 Minute Liters of Oxygen 3 L     5 Minute Oxygen Saturation % 86 %     5 Minute Liters of Oxygen 3 L     6 Minute Oxygen Saturation % 90 %     6 Minute Liters of Oxygen 3 L     2 Minute Post Oxygen Saturation % 98 %     2 Minute Post Liters of Oxygen 3 L           Oxygen Initial Assessment:  Oxygen Initial Assessment - 09/21/19 0811      Home Oxygen   Home Oxygen Device Home Concentrator;E-Tanks;Portable Concentrator   just approved for portable concentrator   Sleep Oxygen Prescription CPAP;Continuous    Liters per minute 2   11 setting   Home Exercise Oxygen Prescription Continuous   new concentrator will be pulsed   Liters per minute 3    Home at Rest  Exercise Oxygen Prescription Continuous    Liters per minute 2.5    Compliance with Home Oxygen Use Yes      Initial 6 min Walk   Oxygen Used Continuous;E-Tanks   may want to test new concentrator once it arrives   Liters per minute 3      Program Oxygen Prescription   Program Oxygen Prescription Continuous;E-Tanks    Liters per minute 3      Intervention   Short Term Goals To learn and exhibit compliance with exercise, home and travel O2 prescription;To learn and understand importance of maintaining oxygen saturations>88%;To learn and understand importance of monitoring SPO2 with pulse oximeter and demonstrate accurate use of the pulse oximeter.;To learn and demonstrate proper pursed lip breathing techniques or other breathing techniques.;To learn and demonstrate proper use of respiratory medications   has pulse oximeter   Long  Term Goals Exhibits compliance with exercise, home and travel O2 prescription;Verbalizes importance of monitoring SPO2 with pulse oximeter and return demonstration;Maintenance of O2  saturations>88%;Exhibits proper breathing techniques, such as pursed lip breathing or other method taught during program session;Compliance with respiratory medication;Demonstrates proper use of MDI's           Oxygen Re-Evaluation:  Oxygen Re-Evaluation    Row Name 09/27/19 1123 11/15/19 1122           Program Oxygen Prescription   Program Oxygen Prescription Continuous;E-Tanks Continuous;E-Tanks      Liters per minute 3 3        Home Oxygen   Home Oxygen Device Home Concentrator;E-Tanks;Portable Concentrator Home Concentrator;E-Tanks;Portable Concentrator      Sleep Oxygen Prescription CPAP;Continuous CPAP;Continuous      Liters per minute 2 2      Home Exercise Oxygen Prescription -- Continuous      Liters per minute 3 3      Home at Rest Exercise Oxygen Prescription Continuous Continuous      Liters per minute 2.5 2.5      Compliance with Home Oxygen Use Yes Yes        Goals/Expected Outcomes   Short Term Goals To learn and exhibit compliance with exercise, home and travel O2 prescription;To learn and understand importance of maintaining oxygen saturations>88%;To learn and understand importance of monitoring SPO2 with pulse oximeter and demonstrate accurate use of the pulse oximeter.;To learn and demonstrate proper pursed lip breathing techniques or other breathing techniques.;To learn and demonstrate proper use of respiratory medications To learn and exhibit compliance with exercise, home and travel O2 prescription;To learn and understand importance of maintaining oxygen saturations>88%;To learn and understand importance of monitoring SPO2 with pulse oximeter and demonstrate accurate use of the pulse oximeter.;To learn and demonstrate proper pursed lip breathing techniques or other breathing techniques.;To learn and demonstrate proper use of respiratory medications      Long  Term Goals Exhibits compliance with exercise, home and travel O2 prescription;Verbalizes importance of  monitoring SPO2 with pulse oximeter and return demonstration;Maintenance of O2 saturations>88%;Exhibits proper breathing techniques, such as pursed lip breathing or other method taught during program session;Compliance with respiratory medication;Demonstrates proper use of MDI's Exhibits compliance with exercise, home and travel O2 prescription;Verbalizes importance of monitoring SPO2 with pulse oximeter and return demonstration;Maintenance of O2 saturations>88%;Exhibits proper breathing techniques, such as pursed lip breathing or other method taught during program session;Compliance with respiratory medication;Demonstrates proper use of MDI's      Comments Reviewed PLB technique with pt.  Talked about how it works and it's importance in maintaining their exercise saturations.  Gianne has been compliant with her oxygen at home.  She will sit and use her PLB to help get better control of her breathing when she feels that it is getting away from her.      Goals/Expected Outcomes Short: Become more profiecient at using PLB.   Long: Become independent at using PLB. Short: Continue to use PLB to help with breathing.  Long: Continued compliance. h             Oxygen Discharge (Final Oxygen Re-Evaluation):  Oxygen Re-Evaluation - 11/15/19 1122      Program Oxygen Prescription   Program Oxygen Prescription Continuous;E-Tanks    Liters per minute 3      Home Oxygen   Home Oxygen Device Home Concentrator;E-Tanks;Portable Concentrator    Sleep Oxygen Prescription CPAP;Continuous    Liters per minute 2    Home Exercise Oxygen Prescription Continuous    Liters per minute 3    Home at Rest Exercise Oxygen Prescription Continuous    Liters per minute 2.5    Compliance with Home Oxygen Use Yes      Goals/Expected Outcomes   Short Term Goals To learn and exhibit compliance with exercise, home and travel O2 prescription;To learn and understand importance of maintaining oxygen saturations>88%;To learn and  understand importance of monitoring SPO2 with pulse oximeter and demonstrate accurate use of the pulse oximeter.;To learn and demonstrate proper pursed lip breathing techniques or other breathing techniques.;To learn and demonstrate proper use of respiratory medications    Long  Term Goals Exhibits compliance with exercise, home and travel O2 prescription;Verbalizes importance of monitoring SPO2 with pulse oximeter and return demonstration;Maintenance of O2 saturations>88%;Exhibits proper breathing techniques, such as pursed lip breathing or other method taught during program session;Compliance with respiratory medication;Demonstrates proper use of MDI's    Comments Sonali has been compliant with her oxygen at home.  She will sit and use her PLB to help get better control of her breathing when she feels that it is getting away from her.    Goals/Expected Outcomes Short: Continue to use PLB to help with breathing.  Long: Continued compliance. h           Initial Exercise Prescription:  Initial Exercise Prescription - 09/22/19 1500      Date of Initial Exercise RX and Referring Provider   Date 09/22/19    Referring Provider Glori Bickers MD      Oxygen   Oxygen Continuous    Liters 3      Treadmill   MPH 0.8    Grade 0    Minutes 15    METs 1.6      NuStep   Level 1    SPM 80    Minutes 15    METs 1.5      T5 Nustep   Level 1    SPM 80    Minutes 15    METs 1.5      Biostep-RELP   Level 1    SPM 50    Minutes 15    METs 1      Prescription Details   Frequency (times per week) 2    Duration Progress to 30 minutes of continuous aerobic without signs/symptoms of physical distress      Intensity   THRR 40-80% of Max Heartrate 94-126    Ratings of Perceived Exertion 11-13    Perceived Dyspnea 0-4      Progression   Progression Continue to progress workloads to maintain intensity  without signs/symptoms of physical distress.      Resistance Training   Training  Prescription Yes    Weight 3 lb    Reps 10-15           Perform Capillary Blood Glucose checks as needed.  Exercise Prescription Changes:  Exercise Prescription Changes    Row Name 09/22/19 1500 10/04/19 1100 10/19/19 0900 11/01/19 1100 11/14/19 1300     Response to Exercise   Blood Pressure (Admit) 134/72 158/80 114/66 118/82 144/74   Blood Pressure (Exercise) 152/84 156/84 122/74 130/64 124/64   Blood Pressure (Exit) 126/72 126/64 102/60 106/62 104/64   Heart Rate (Admit) 62 bpm 78 bpm 85 bpm 80 bpm 80 bpm   Heart Rate (Exercise) 120 bpm 84 bpm 98 bpm 96 bpm 105 bpm   Heart Rate (Exit) 64 bpm 67 bpm 62 bpm 61 bpm 84 bpm   Oxygen Saturation (Admit) 99 % 97 % 90 % 94 % 96 %   Oxygen Saturation (Exercise) 85 % 95 % 93 % 97 % 93 %   Oxygen Saturation (Exit) 97 % 95 % 98 % 98 % 94 %   Rating of Perceived Exertion (Exercise) _0 Perceived Dyspnea (Exercise) _1 Symptoms dizzy, hip hurting 8/10 -- fatigue -- none   Comments walk test results first day -- -- --   Duration -- -- Continue with 30 min of aerobic exercise without signs/symptoms of physical distress. Continue with 30 min of aerobic exercise without signs/symptoms of physical distress. Continue with 30 min of aerobic exercise without signs/symptoms of physical distress.   Intensity -- -- THRR unchanged THRR unchanged THRR unchanged     Progression   Progression -- -- Continue to progress workloads to maintain intensity without signs/symptoms of physical distress. Continue to progress workloads to maintain intensity without signs/symptoms of physical distress. Continue to progress workloads to maintain intensity without signs/symptoms of physical distress.   Average METs -- -- 1.5 1.5 1.8     Resistance Training   Training Prescription -- -- Yes Yes Yes   Weight -- -- 3 lb  3 lb  3 lb   Reps -- -- 10-15 10-15 10-15     Interval Training   Interval Training -- -- No No No     Treadmill   MPH -- --  0.8 -- 0.8   Grade -- -- 0 -- 0   Minutes -- -- 15 -- 15   METs -- -- 1.61 -- 1.6     NuStep   Level -- -- _2 SPM -- -- -- 80 --   Minutes -- -- _3 METs -- -- 1.6 2 1.8     T5 Nustep   Level -- -- 1 -- 1   Minutes -- -- 15 -- 15   METs -- -- 1.8 -- 1.8     Biostep-RELP   Level -- -- _4 SPM -- -- -- 50 --   Minutes -- -- _5 METs -- -- _6 Row Name 11/15/19 1100 11/29/19 1300 12/12/19 1500 12/20/19 1500 01/02/20 1600     Response to Exercise   Blood Pressure (Admit) -- 114/62 128/78 120/60 120/70   Blood Pressure (Exercise) -- 118/64 122/68 132/58 110/70   Blood Pressure (Exit) -- 130/82 118/64 128/70 112/66   Heart Rate (Admit) -- 85 bpm 104  bpm 60 bpm 69 bpm   Heart Rate (Exercise) -- 102 bpm 112 bpm 104 bpm 85 bpm   Heart Rate (Exit) -- 86 bpm 61 bpm 68 bpm 67 bpm   Oxygen Saturation (Admit) -- 90 % 90 % 91 % 93 %   Oxygen Saturation (Exercise) -- 92 % 92 % 91 % 95 %   Oxygen Saturation (Exit) -- 88 % 93 % 95 % 99 %   Rating of Perceived Exertion (Exercise) -- _0 Perceived Dyspnea (Exercise) -- _1 Symptoms -- none none none none   Duration -- Continue with 30 min of aerobic exercise without signs/symptoms of physical distress. Continue with 30 min of aerobic exercise without signs/symptoms of physical distress. Continue with 30 min of aerobic exercise without signs/symptoms of physical distress. Continue with 30 min of aerobic exercise without signs/symptoms of physical distress.   Intensity -- THRR unchanged THRR unchanged THRR unchanged THRR unchanged     Progression   Progression -- Continue to progress workloads to maintain intensity without signs/symptoms of physical distress. Continue to progress workloads to maintain intensity without signs/symptoms of physical distress. Continue to progress workloads to maintain intensity without signs/symptoms of physical distress. Continue to progress workloads to maintain intensity  without signs/symptoms of physical distress.   Average METs -- 2.25 1.73 2 1.8     Resistance Training   Training Prescription -- Yes Yes Yes Yes   Weight -- 3 lb 3 lb 3 lb 3 lb   Reps -- 10-15 10-15 10-15 10-15     Interval Training   Interval Training -- No No No No     Treadmill   MPH -- -- 0.8 -- 0.8   Grade -- -- 0 -- 0   Minutes -- -- 15 -- 15   METs -- -- 1.6 -- 1.6     NuStep   Level -- -- _2 SPM -- -- -- 80 --   Minutes -- -- _3 METs -- -- 1.5 2.1 1.8     T5 Nustep   Level -- 4 1 -- 5   SPM -- 80 -- -- --   Minutes -- 15 15 -- 15   METs -- 2.5 1.8 -- 1.8     Biostep-RELP   Level -- _4 SPM -- 50 -- 50 --   Minutes -- _5 METs -- _6 Home Exercise Plan   Plans to continue exercise at Home (comment)  walking, staff videos Home (comment)  walking, staff videos Home (comment)  walking, staff videos Home (comment)  walking, staff videos Home (comment)  walking, staff videos   Frequency Add 2 additional days to program exercise sessions. Add 2 additional days to program exercise sessions. Add 2 additional days to program exercise sessions. Add 2 additional days to program exercise sessions. Add 2 additional days to program exercise sessions.   Initial Home Exercises Provided 11/15/19 11/15/19 11/15/19 11/15/19 11/15/19   Row Name 01/19/20 1200             Response to Exercise   Blood Pressure (Admit) 114/68       Blood Pressure (Exercise) 118/72       Blood Pressure (Exit) 136/64       Heart Rate (Admit) 103 bpm       Heart Rate (Exercise)  110 bpm       Heart Rate (Exit) 70 bpm       Oxygen Saturation (Admit) 90 %       Oxygen Saturation (Exercise) 90 %       Oxygen Saturation (Exit) 95 %       Rating of Perceived Exertion (Exercise) 13       Perceived Dyspnea (Exercise) 2       Symptoms none       Duration Continue with 30 min of aerobic exercise without signs/symptoms of physical distress.       Intensity THRR  unchanged         Progression   Progression Continue to progress workloads to maintain intensity without signs/symptoms of physical distress.       Average METs 2         Resistance Training   Training Prescription Yes       Weight 3 lb       Reps 10-15         Treadmill   MPH 0.8       Grade 0       Minutes 15       METs 1.6         T5 Nustep   Level 5       SPM 80       Minutes 15         Home Exercise Plan   Plans to continue exercise at Home (comment)  walking, staff videos       Frequency Add 2 additional days to program exercise sessions.       Initial Home Exercises Provided 11/15/19              Exercise Comments:  Exercise Comments    Row Name 10/25/19 1240 01/31/20 1132 01/31/20 1138       Exercise Comments Pulse oximeter not picking up HR. Placed on monitor to determine HR and rhythm. Found to be in A Fib. Note sent to referring physician. Patient is not aware of ever being in A fib. She is on Warfarin daily. Shaquia is wearing a heart monitor 6/11 to 6/25 Adysen is wearing a heart monitor 6/11 to 6/25  Doctor "saw a blip on my EKG"  Monitor to see if more.            Exercise Goals and Review:  Exercise Goals    Row Name 09/22/19 1540             Exercise Goals   Increase Physical Activity Yes       Intervention Provide advice, education, support and counseling about physical activity/exercise needs.;Develop an individualized exercise prescription for aerobic and resistive training based on initial evaluation findings, risk stratification, comorbidities and participant's personal goals.       Expected Outcomes Short Term: Attend rehab on a regular basis to increase amount of physical activity.;Long Term: Add in home exercise to make exercise part of routine and to increase amount of physical activity.;Long Term: Exercising regularly at least 3-5 days a week.       Increase Strength and Stamina Yes       Intervention Provide advice, education, support  and counseling about physical activity/exercise needs.;Develop an individualized exercise prescription for aerobic and resistive training based on initial evaluation findings, risk stratification, comorbidities and participant's personal goals.       Expected Outcomes Short Term: Increase workloads from initial exercise prescription for resistance, speed, and METs.;Short Term:  Perform resistance training exercises routinely during rehab and add in resistance training at home;Long Term: Improve cardiorespiratory fitness, muscular endurance and strength as measured by increased METs and functional capacity (6MWT)       Able to understand and use rate of perceived exertion (RPE) scale Yes       Intervention Provide education and explanation on how to use RPE scale       Expected Outcomes Short Term: Able to use RPE daily in rehab to express subjective intensity level;Long Term:  Able to use RPE to guide intensity level when exercising independently       Able to understand and use Dyspnea scale Yes       Intervention Provide education and explanation on how to use Dyspnea scale       Expected Outcomes Short Term: Able to use Dyspnea scale daily in rehab to express subjective sense of shortness of breath during exertion;Long Term: Able to use Dyspnea scale to guide intensity level when exercising independently       Knowledge and understanding of Target Heart Rate Range (THRR) Yes       Intervention Provide education and explanation of THRR including how the numbers were predicted and where they are located for reference       Expected Outcomes Short Term: Able to state/look up THRR;Short Term: Able to use daily as guideline for intensity in rehab;Long Term: Able to use THRR to govern intensity when exercising independently       Able to check pulse independently Yes       Intervention Provide education and demonstration on how to check pulse in carotid and radial arteries.;Review the importance of being  able to check your own pulse for safety during independent exercise       Expected Outcomes Short Term: Able to explain why pulse checking is important during independent exercise;Long Term: Able to check pulse independently and accurately       Understanding of Exercise Prescription Yes       Intervention Provide education, explanation, and written materials on patient's individual exercise prescription       Expected Outcomes Short Term: Able to explain program exercise prescription;Long Term: Able to explain home exercise prescription to exercise independently              Exercise Goals Re-Evaluation :  Exercise Goals Re-Evaluation    [place] Name 09/27/19 1122 10/19/19 0942 11/01/19 1132 11/14/19 1339 11/15/19 1117     Exercise Goal Re-Evaluation   Exercise Goals Review Increase Physical Activity;Able to understand and use rate of perceived exertion (RPE) scale;Knowledge and understanding of Target Heart Rate Range (THRR);Understanding of Exercise Prescription;Increase Strength and Stamina;Able to understand and use Dyspnea scale;Able to check pulse independently Increase Physical Activity;Increase Strength and Stamina;Understanding of Exercise Prescription Increase Physical Activity;Increase Strength and Stamina;Able to understand and use rate of perceived exertion (RPE) scale;Able to understand and use Dyspnea scale;Knowledge and understanding of Target Heart Rate Range (THRR);Able to check pulse independently;Understanding of Exercise Prescription Increase Physical Activity;Increase Strength and Stamina;Understanding of Exercise Prescription Increase Physical Activity;Increase Strength and Stamina;Understanding of Exercise Prescription   Comments Reviewed RPE scale, THR and program prescription with pt today.  Pt voiced understanding and was given a copy of goals to take home. Akshaya is off to a good start in rehab. She is already up to the full 15 min on the treadmill. We will continue to monitor  her progress. Candela has increased from .6-.8 on TM.  Staff will  monitor progress. Yasha is doing well in rehab.  She is now up to 2 METs on the BioStep.  We will encourage her to increase workloads and continue to monitor her progress. Milina is doing well in rehab.  She over did it some trying to get ready for company, but she was able.  She is doing some exericse at home.  Reviewed home exercise with pt today.  Pt plans to walk and use videos at home for exercise.  Reviewed THR, pulse, RPE, sign and symptoms, and when to call 911 or MD.  Also discussed weather considerations and indoor options.  Pt voiced understanding.   Expected Outcomes Short: Use RPE daily to regulate intensity. Long: Follow program prescription in THR. Short: Continue to attend rehab regularly.  Long: Continue to follow program prescription. Short:  attend class consistently Long : improve overall stamina Short: Increase workloads.  Long: Continue to improve stamina. Short: Start to add in more exercise at home.  Long: Continue to improve stamina.   [place] Name 11/29/19 1332 12/12/19 1539 12/20/19 1554 12/27/19 1132 01/02/20 1616     Exercise Goal Re-Evaluation   Exercise Goals Review Increase Physical Activity;Increase Strength and Stamina;Able to understand and use rate of perceived exertion (RPE) scale;Able to understand and use Dyspnea scale;Knowledge and understanding of Target Heart Rate Range (THRR);Able to check pulse independently;Understanding of Exercise Prescription Increase Physical Activity;Increase Strength and Stamina;Understanding of Exercise Prescription Increase Physical Activity;Increase Strength and Stamina;Able to understand and use rate of perceived exertion (RPE) scale;Able to understand and use Dyspnea scale;Knowledge and understanding of Target Heart Rate Range (THRR);Able to check pulse independently;Understanding of Exercise Prescription Increase Physical Activity;Increase Strength and Stamina;Able to understand  and use rate of perceived exertion (RPE) scale;Able to understand and use Dyspnea scale;Knowledge and understanding of Target Heart Rate Range (THRR);Able to check pulse independently;Understanding of Exercise Prescription Increase Physical Activity;Increase Strength and Stamina;Understanding of Exercise Prescription   Comments Angelle has attended consistently and is up to level 4 on NS.  She works in correct THR and RPE range. Jodean is doing well in rehab.  She is doing her whole 15-20 min on the treadmill now.  We will move up her workload on the T5 NuStep.  We will continue to monitor her progress. Victorya missed some sessioins this month.  She will see more progress with more consistent attendance. Maryetta walks at home inside her house.  She plans to try staff videos. Laisha has been doing well in rehab.  She is now up to level 5 on the NuStep.  We will continue to monitor her progress.   Expected Outcomes Short:  increase level on Biostep Long:  improve overall stamina Short: Increase workloads  Long: Continue to improve stamina. Short - attend/exercsie at home 3 days per week Long:  improve stamina/MET level Short: add in videos for exercise at least one other day Long: improve stamina Short: Increase SPM on both NuStep and BioStep for higher METs  Long: Continue to improve stamina.   [place] Name 01/19/20 1219             Exercise Goal Re-Evaluation   Exercise Goals Review Increase Physical Activity;Increase Strength and Stamina;Understanding of Exercise Prescription       Comments Mariaclara missed both sessions this week.  Staff will monitor progress.       Expected Outcomes Short: attend consistently Long: improve overall stamina              Discharge Exercise Prescription (Final  Exercise Prescription Changes):  Exercise Prescription Changes - 01/19/20 1200      Response to Exercise   Blood Pressure (Admit) 114/68    Blood Pressure (Exercise) 118/72    Blood Pressure (Exit) 136/64    Heart Rate  (Admit) 103 bpm    Heart Rate (Exercise) 110 bpm    Heart Rate (Exit) 70 bpm    Oxygen Saturation (Admit) 90 %    Oxygen Saturation (Exercise) 90 %    Oxygen Saturation (Exit) 95 %    Rating of Perceived Exertion (Exercise) 13    Perceived Dyspnea (Exercise) 2    Symptoms none    Duration Continue with 30 min of aerobic exercise without signs/symptoms of physical distress.    Intensity THRR unchanged      Progression   Progression Continue to progress workloads to maintain intensity without signs/symptoms of physical distress.    Average METs 2      Resistance Training   Training Prescription Yes    Weight 3 lb    Reps 10-15      Treadmill   MPH 0.8    Grade 0    Minutes 15    METs 1.6      T5 Nustep   Level 5    SPM 80    Minutes 15      Home Exercise Plan   Plans to continue exercise at Home (comment)   walking, staff videos   Frequency Add 2 additional days to program exercise sessions.    Initial Home Exercises Provided 11/15/19           Nutrition:  Target Goals: Understanding of nutrition guidelines, daily intake of sodium <1515m, cholesterol <2054m calories 30% from fat and 7% or less from saturated fats, daily to have 5 or more servings of fruits and vegetables.  Education: Controlling Sodium/Reading Food Labels -Group verbal and written material supporting the discussion of sodium use in heart healthy nutrition. Review and explanation with models, verbal and written materials for utilization of the food label.   Education: General Nutrition Guidelines/Fats and Fiber: -Group instruction provided by verbal, written material, models and posters to present the general guidelines for heart healthy nutrition. Gives an explanation and review of dietary fats and fiber.   Biometrics:  Pre Biometrics - 09/22/19 1541      Pre Biometrics   Height 5' 6.5" (1.689 m)    Weight 250 lb 12.8 oz (113.8 kg)    BMI (Calculated) 39.88    Single Leg Stand 1.53 seconds             Nutrition Therapy Plan and Nutrition Goals:  Nutrition Therapy & Goals - 09/26/19 1503      Nutrition Therapy   Diet HH diet    Drug/Food Interactions Statins/Certain Fruits;Coumadin/Vit K    Protein (specify units) 125.18g    Fiber 25 grams    Whole Grain Foods 3 servings    Saturated Fats 12 max. grams    Fruits and Vegetables 5 servings/day    Sodium 1.5 grams      Personal Nutrition Goals   Nutrition Goal ST: include more vegetables in an easier manner LT: able to get around better, would like to increase balance, MD said to lose weight.    Comments Pt reports able to do ADL. Toast with peanut butter (wheat, unless its expensive) with 2 cups of coffee (black)and  Juice glass of cranberry juice (mostly water). Pimento cheese cracker or banana and peanut butter, cereal,  yogurt.  Meatloaf and baked potato. Pt reports not eating many vegetables. Snacks on fruit. Pt on long lasting insulin; checks BG every morning - takes insulin at night. Discussed HH eating, set-point weight, pulmonary MNT. Lipitor (statin),Colace,Lasix,T2DM: januvia, Insulin Glargine (long lasting),Coumadin,Vitamin D, CKD stg 4, CHF, Pulmonary hypertension      Intervention Plan   Intervention Nutrition handout(s) given to patient.    Expected Outcomes Short Term Goal: Understand basic principles of dietary content, such as calories, fat, sodium, cholesterol and nutrients.;Short Term Goal: A plan has been developed with personal nutrition goals set during dietitian appointment.;Long Term Goal: Adherence to prescribed nutrition plan.           Nutrition Assessments:  Nutrition Assessments - 09/22/19 1542      MEDFICTS Scores   Pre Score 56           MEDIFICTS Score Key:          ?70 Need to make dietary changes          40-70 Heart Healthy Diet         ? 40 Therapeutic Level Cholesterol Diet  Nutrition Goals Re-Evaluation:  Nutrition Goals Re-Evaluation    [place] Name 10/18/19 1126 11/24/19  1130 12/27/19 1139 01/31/20 1141       Goals   Nutrition Goal ST: include more vegetables in an easier manner LT: able to get around better, would like to increase balance, MD said to lose weight. ST: include more vegetables in an easier manner LT: able to get around better, would like to increase balance, MD said to lose weight. ST: include more vegetables in an easier manner LT: able to get around better, would like to increase balance, MD said to lose weight. --    Comment Pt reports including more fruits and vegetables and is doing well incorporating them into her meals. discussed consistent eating and BG. Pt reports being able to include more F/V to diet, carrots, cucumbers, tomatoes, bananas, and cuties. Gave pt some ideas like tzasiki dip and gave handouts. Pt reports getting an airfyer and has started using it. Ayva has been using her air fryer and eating more fruits and vegetables.  Her NP referred her to [facility].    Expected Outcome ST: include more vegetables in an easier manner LT: able to get around better, would like to increase balance, MD said to lose weight. ST: include more vegetables in an easier manner LT: able to get around better, would like to increase balance, MD said to lose weight. ST: include more vegetables in an easier manner LT: able to get around better, would like to increase balance, MD said to lose weight. Short : continue with healthy habits Long: acheive weight loss           Nutrition Goals Discharge (Final Nutrition Goals Re-Evaluation):  Nutrition Goals Re-Evaluation - 01/31/20 1141      Goals   Comment Jacqulyn has been using her air fryer and eating more fruits and vegetables.  Her NP referred her to [facility].    Expected Outcome Short : continue with healthy habits Long: acheive weight loss           Psychosocial: Target Goals: Acknowledge presence or absence of significant depression and/or stress,  maximize coping skills, provide positive support system. Participant is able to verbalize types and ability to use techniques and skills needed for reducing stress and depression.   Education: Depression - Provides group  verbal and written instruction on the correlation between heart/lung disease and depressed mood, treatment options, and the stigmas associated with seeking treatment.   Education: Sleep Hygiene -Provides group verbal and written instruction about how sleep can affect your health.  Define sleep hygiene, discuss sleep cycles and impact of sleep habits. Review good sleep hygiene tips.    Education: Stress and Anxiety: - Provides group verbal and written instruction about the health risks of elevated stress and causes of high stress.  Discuss the correlation between heart/lung disease and anxiety and treatment options. Review healthy ways to manage with stress and anxiety.   Initial Review & Psychosocial Screening:  Initial Psych Review & Screening - 09/21/19 0817      Initial Review   Current issues with Current Depression;History of Depression;Current Psychotropic Meds;Current Stress Concerns    Source of Stress Concerns Chronic Illness;Family;Retirement/disability    Comments History of depression from loss of husband six years ago and started taking Elavil and she feels like her meds are working.  She recently moved down here about 7 months ago and the pandemic has made it hard to get acquainted.  She has two kids, daughter in [place] and son in [place].      Family Dynamics   Good Support System? Yes   Son and daughter are support system and live near by, hard to make new friends   Comments Lost husband six years ago and just moved here 7 months ago      Barriers   Psychosocial barriers to participate in program The patient should benefit from training in stress management and relaxation.;Psychosocial barriers identified (see note)      Screening Interventions    Interventions Encouraged to exercise;Program counselor consult;To provide support and resources with identified psychosocial needs;Provide feedback about the scores to participant    Expected Outcomes Short Term goal: Utilizing psychosocial counselor, staff and physician to assist with identification of specific Stressors or current issues interfering with healing process. Setting desired goal for each stressor or current issue identified.;Long Term Goal: Stressors or current issues are controlled or eliminated.;Short Term goal: Identification and review with participant of any Quality of Life or Depression concerns found by scoring the questionnaire.;Long Term goal: The participant improves quality of Life and PHQ9 Scores as seen by post scores and/or verbalization of changes           Quality of Life Scores:  Scores of 19 and below usually indicate a poorer quality of life in these areas.  A difference of  2-3 points is a clinically meaningful difference.  A difference of 2-3 points in the total score of the Quality of Life Index has been associated with significant improvement in overall quality of life, self-image, physical symptoms, and general health in studies assessing change in quality of life.  PHQ-9: Recent Review Flowsheet Data    Depression screen [facility] 2/9 10/11/2019 09/22/2019 03/28/2019   Decreased Interest 1 1 0   Down, Depressed, Hopeless _0 PHQ - 2 Score _1 Altered sleeping 1 2 -   Tired, decreased energy 1 3 -   Change in appetite 1 2 -   Feeling bad or failure about yourself  1 2 -   Trouble concentrating 0 0 -   Moving slowly or fidgety/restless 0 0 -   Suicidal thoughts 0 0 -   PHQ-9 Score 6 11 -   Difficult doing work/chores Somewhat difficult Somewhat difficult -  Interpretation of Total Score  Total Score Depression Severity:  1-4 = Minimal depression, 5-9 = Mild depression, 10-14 = Moderate depression, 15-19 = Moderately severe depression, 20-27 =  Severe depression   Psychosocial Evaluation and Intervention:  Psychosocial Evaluation - 09/21/19 0831      Psychosocial Evaluation & Interventions   Interventions Encouraged to exercise with the program and follow exercise prescription    Comments Ms. Walsworth is coming into Pulmonary Rehab with history of pulmonary hypertension, ILD, and heart failure.  She moved to the area seven months ago and has found it hard to settle in and make friends during the pandemic.  Her kids live close by (daughter in [place] and son in [place], [place]).  She lost her husband six years ago and decided it was time to be closer to her kids. She really wants to get into a routine and start moving more as the pandemic has turned her into a couch potato.  She is currently needing to be wheeled around at the hospital but we are going to work with her to be able to walk around on her own by building strength and stamina.  Coming to class will also offer her a social outlet and chance to meet some people in the area.  We look forward to working with her.    Expected Outcomes Short: Build up strength and stamina for improved mobility.  Long: Make adjustments to living in the area.    Continue Psychosocial Services  Follow up required by staff           Psychosocial Re-Evaluation:  Psychosocial Re-Evaluation    [place] Name 10/27/19 1136 11/15/19 1118 12/27/19 1127 01/31/20 1143       Psychosocial Re-Evaluation   Current issues with Current Stress Concerns;Current Sleep Concerns Current Stress Concerns Current Stress Concerns --    Comments Feather moved here from [place] 8 months ago.  She hasnt been able to meet many people yet.  She does have family here - daughter and son in law.  She states she sleeps pretty well - around 6 hours a night. Sammye is doing well in rehab. She is feeling good overall and mentally.  She has met a few more people coming to class, but hard to talk while exercising.  She enjoys being with people.  She  continues to sleep well. Camyah is still feeling good overall and was able to visit family over the weekend.  She enjoys playing cards and will check on local [facility] that offer activities. Lismary still feels well overall.    Expected Outcomes Short: continue to attend [facility] for the exercise Long:  meet more people locally Short: Continue to try to get out to meet people  Long: Continue to stay positive. Short: attend LW for exercise and get out to meet others Long:  maintain positive outlook Short : attend LW for exercise Long:  maintain positive outlook    Interventions Encouraged to attend Pulmonary Rehabilitation for the exercise Encouraged to attend Pulmonary Rehabilitation for the exercise -- --    Continue Psychosocial Services  -- Follow up required by staff -- --           Psychosocial Discharge (Final Psychosocial Re-Evaluation):  Psychosocial Re-Evaluation - 01/31/20 1143      Psychosocial Re-Evaluation   Comments Oaklie still feels well overall.    Expected Outcomes Short : attend LW for exercise Long:  maintain positive outlook  Education: Education Goals: Education classes will be provided on a weekly basis, covering required topics. Participant will state understanding/return demonstration of topics presented.  Learning Barriers/Preferences:  Learning Barriers/Preferences - 09/21/19 3013      Learning Barriers/Preferences   Learning Barriers Sight   glasses   Learning Preferences None           General Pulmonary Education Topics:  Infection Prevention: - Provides verbal and written material to individual with discussion of infection control including proper hand washing and proper equipment cleaning during exercise session.   Pulmonary Rehab from 09/22/2019 in [facility]  Date 09/22/19  Educator [facility]  Instruction Review Code 1- Verbalizes Understanding      Falls Prevention: - Provides verbal and written material to  individual with discussion of falls prevention and safety.   Pulmonary Rehab from 09/22/2019 in [facility]  Date 09/22/19  Educator [facility]  Instruction Review Code 1- Verbalizes Understanding      Chronic Lung Diseases: - Group verbal and written instruction to review updates, respiratory medications, advancements in procedures and treatments. Discuss use of supplemental oxygen including available portable oxygen systems, continuous and intermittent flow rates, concentrators, personal use and safety guidelines. Review proper use of inhaler and spacers. Provide informative websites for self-education.    Energy Conservation: - Provide group verbal and written instruction for methods to conserve energy, plan and organize activities. Instruct on pacing techniques, use of adaptive equipment and posture/positioning to relieve shortness of breath.   Triggers and Exacerbations: - Group verbal and written instruction to review types of environmental triggers and ways to prevent exacerbations. Discuss weather changes, air quality and the benefits of nasal washing. Review warning signs and symptoms to help prevent infections. Discuss techniques for effective airway clearance, coughing, and vibrations.   AED/CPR: - Group verbal and written instruction with the use of models to demonstrate the basic use of the AED with the basic ABC's of resuscitation.   Anatomy and Physiology of the Lungs: - Group verbal and written instruction with the use of models to provide basic lung anatomy and physiology related to function, structure and complications of lung disease.   Anatomy & Physiology of the Heart: - Group verbal and written instruction and models provide basic cardiac anatomy and physiology, with the coronary electrical and arterial systems. Review of Valvular disease and Heart Failure   Cardiac Medications: - Group verbal and written instruction to review commonly prescribed  medications for heart disease. Reviews the medication, class of the drug, and side effects.   Other: -Provides group and verbal instruction on various topics (see comments)   Knowledge Questionnaire Score:  Knowledge Questionnaire Score - 09/22/19 1542      Knowledge Questionnaire Score   Pre Score 18/18            Core Components/Risk Factors/Patient Goals at Admission:  Personal Goals and Risk Factors at Admission - 09/21/19 0856      Core Components/Risk Factors/Patient Goals on Admission    Weight Management Yes;Weight Loss    Intervention Weight Management: Develop a combined nutrition and exercise program designed to reach desired caloric intake, while maintaining appropriate intake of nutrient and fiber, sodium and fats, and appropriate energy expenditure required for the weight goal.;Weight Management: Provide education and appropriate resources to help participant work on and attain dietary goals.    Expected Outcomes Short Term: Continue to assess and modify interventions until short term weight is achieved;Long Term: Adherence to nutrition and  physical activity/exercise program aimed toward attainment of established weight goal;Weight Loss: Understanding of general recommendations for a balanced deficit meal plan, which promotes 1-2 lb weight loss per week and includes a negative energy balance of [phone] kcal/d;Understanding recommendations for meals to include 15-35% energy as protein, 25-35% energy from fat, 35-60% energy from carbohydrates, less than 217m of dietary cholesterol, 20-35 gm of total fiber daily;Understanding of distribution of calorie intake throughout the day with the consumption of 4-5 meals/snacks    Diabetes Yes    Intervention Provide education about signs/symptoms and action to take for hypo/hyperglycemia.;Provide education about proper nutrition, including hydration, and aerobic/resistive exercise prescription along with prescribed medications to achieve  blood glucose in normal ranges: Fasting glucose 65-99 mg/dL    Expected Outcomes Short Term: Participant verbalizes understanding of the signs/symptoms and immediate care of hyper/hypoglycemia, proper foot care and importance of medication, aerobic/resistive exercise and nutrition plan for blood glucose control.;Long Term: Attainment of HbA1C < 7%.    Heart Failure Yes    Intervention Provide a combined exercise and nutrition program that is supplemented with education, support and counseling about heart failure. Directed toward relieving symptoms such as shortness of breath, decreased exercise tolerance, and extremity edema.    Expected Outcomes Improve functional capacity of life;Short term: Attendance in program 2-3 days a week with increased exercise capacity. Reported lower sodium intake. Reported increased fruit and vegetable intake. Reports medication compliance.;Short term: Daily weights obtained and reported for increase. Utilizing diuretic protocols set by physician.;Long term: Adoption of self-care skills and reduction of barriers for early signs and symptoms recognition and intervention leading to self-care maintenance.    Hypertension Yes    Intervention Provide education on lifestyle modifcations including regular physical activity/exercise, weight management, moderate sodium restriction and increased consumption of fresh fruit, vegetables, and low fat dairy, alcohol moderation, and smoking cessation.;Monitor prescription use compliance.    Expected Outcomes Short Term: Continued assessment and intervention until BP is < 140/935mHG in hypertensive participants. < 130/8044mG in hypertensive participants with diabetes, heart failure or chronic kidney disease.;Long Term: Maintenance of blood pressure at goal levels.    Lipids Yes    Intervention Provide education and support for participant on nutrition & aerobic/resistive exercise along with prescribed medications to achieve LDL <57m86mDL  >40mg52m Expected Outcomes Short Term: Participant states understanding of desired cholesterol values and is compliant with medications prescribed. Participant is following exercise prescription and nutrition guidelines.;Long Term: Cholesterol controlled with medications as prescribed, with individualized exercise RX and with personalized nutrition plan. Value goals: LDL < 57mg,88m > 40 mg.           Education:Diabetes - Individual verbal and written instruction to review signs/symptoms of diabetes, desired ranges of glucose level fasting, after meals and with exercise. Acknowledge that pre and post exercise glucose checks will be done for 3 sessions at entry of program.   Education: Know Your Numbers and Risk Factors: -Group verbal and written instruction about important numbers in your health.  Discussion of what are risk factors and how they play a role in the disease process.  Review of Cholesterol, Blood Pressure, Diabetes, and BMI and the role they play in your overall health.   Core Components/Risk Factors/Patient Goals Review:   Goals and Risk Factor Review    Row Name 10/27/19 1134 11/15/19 1120 12/27/19 1123 12/27/19 1154 01/31/20 1137     Core Components/Risk Factors/Patient Goals Review   Personal Goals Review Weight Management/Obesity;Improve shortness of  breath with ADL's;Hypertension;Lipids;Heart Failure Weight Management/Obesity;Improve shortness of breath with ADL's;Hypertension;Lipids;Heart Failure Weight Management/Obesity;Improve shortness of breath with ADL's;Hypertension;Lipids;Heart Failure -- Weight Management/Obesity;Improve shortness of breath with ADL's;Hypertension;Lipids;Heart Failure   Review Bernese is taking all meds as directed.  Her weight was up today and she will call her Dr to follow up about fluid weight.  She feels better since she started exercising.  She can do some ADLs easier. Iyanla is watching her weight and it stays pretty steady with 3-4 lb range.   She has not talked to doctor about her fluid levels and watching her heart failure.  She denies any other heart failure symptoms.  Blood pressures have been good overall. Antonisha checks her weight daily.  It is still staying steady.  Her weight was up a little today after celebrating Mothers Day with children.  She had a little shortness of breath today.  She hasnt seen her Dr yet but will first of June. Staff instructed her to call Dr if shortness of breath doesnt improve. Saw pulmonary NP yesterday - she changed some meds and referred Chastin to [place] RD for weight loss.  She hasnt been exercising at home but feels she can tell improvement when shes doing ADLs.  Staff gave her YouTube info and suggested chair exercise.   Expected Outcomes Short:  follow up with Dr Laverta Baltimore: manage risk factors Short: Talk to doctor about fluid levels  Long: Continue to manage heart failure Short: follow up with Dr Laverta Baltimore: manage rsk factors Short: follow up with Dr Laverta [place]: manage risk factors Short: continue medications recommended by Dr Laverta Baltimore:  manage risk factors          Core Components/Risk Factors/Patient Goals at Discharge (Final Review):   Goals and Risk Factor Review - 01/31/20 1137      Core Components/Risk Factors/Patient Goals Review   Personal Goals Review Weight Management/Obesity;Improve shortness of breath with ADL's;Hypertension;Lipids;Heart Failure    Review Saw pulmonary NP yesterday - she changed some meds and referred Grady to [place] RD for weight loss.  She hasnt been exercising at home but feels she can tell improvement when shes doing ADLs.  Staff gave her YouTube info and suggested chair exercise.    Expected Outcomes Short: continue medications recommended by Dr Laverta Baltimore:  manage risk factors           ITP Comments:  ITP Comments    Row Name 09/21/19 0857 09/22/19 1534 09/26/19 1536 09/27/19 1120 10/12/19 0625   ITP Comments Completed virtual orientation today.  EP eval scheduled  for 2/4 at 930. Documentation for diagnosis can be found in [facility] encounter 09/07/19. Completed 6MWT and gym orientation.  Initial ITP created and sent for review to Dr. Emily Filbert, Medical Director. Completed Initial RD Eval First full day of exercise!  Patient was oriented to gym and equipment including functions, settings, policies, and procedures.  Patient's individual exercise prescription and treatment plan were reviewed.  All starting workloads were established based on the results of the 6 minute walk test done at initial orientation visit.  The plan for exercise progression was also introduced and progression will be customized based on patient's performance and goals. 30 day chart review completed. ITP sent to Dr Zachery Dakins Medical Director, for review,changes as needed and signature. New to program   Row Name 10/25/19 1240 11/08/19 1138 11/09/19 0606 12/07/19 0537 01/02/20 1616   ITP Comments Pulse oximeter not picking up HR. Placed on monitor to determine HR and rhythm. Found  to be in A Fib. Note sent to referring physician. Patient is not aware of ever being in A fib. She is on Warfarin daily. Dr. Rockey Situ returned fax of afib stating to continue with exercise program 30 day chart review completed. ITP sent to Dr Zachery Dakins Medical Director, for review,changes as needed and signature. Continue with ITP if no changes requested 30 Day review completed. Medical Director review done, changes made as directed,and approval shown by signature of Market researcher. Velda called out today with a stomach bug.   [place] Name 01/04/20 0543 01/31/20 1132 02/01/20 0519       ITP Comments 30 Day review completed. ITP review done, changes made as directed,and approval shown by signature of  Scientist, research (life sciences). Lashundra is wearing a heart monitor 6/11 to 6/25  Doctor "saw a blip on my EKG"  Monitor to see if more. 30 Day review completed. Medical Director ITP review done, changes made as directed,and signed  approval by Medical Director.            Comments: 30 Day review completed. Medical Director ITP review done, changes made as directed,and signed approval by Medical Director.

## 2020-02-02 ENCOUNTER — Encounter: Payer: Medicare Other | Admitting: *Deleted

## 2020-02-02 ENCOUNTER — Other Ambulatory Visit: Payer: Self-pay

## 2020-02-02 DIAGNOSIS — I272 Pulmonary hypertension, unspecified: Secondary | ICD-10-CM

## 2020-02-02 DIAGNOSIS — I270 Primary pulmonary hypertension: Secondary | ICD-10-CM | POA: Diagnosis not present

## 2020-02-02 NOTE — Progress Notes (Signed)
Daily Session Note  Patient Details  Name: Ann Flynn MRN: [id number] Date of Birth: March 21, 1941 Referring Provider:     Pulmonary Rehab from 09/22/2019 in [facility] Cardiac and Pulmonary Rehab  Referring Provider Glori Bickers MD      Encounter Date: 02/02/2020  Check In:  Session Check In - 02/02/20 1114      Check-In   Supervising physician immediately available to respond to emergencies See telemetry face sheet for immediately available ER MD    Location [facility]-Cardiac & Pulmonary Rehab    Staff Present Renita Papa, RN BSN;Melissa Caiola RDN, Rowe Pavy, BA, ACSM CEP, Exercise Physiologist;Kara Eliezer Bottom, MS Exercise Physiologist    Virtual Visit No    Medication changes reported     No    Fall or balance concerns reported    No    Warm-up and Cool-down Performed on first and last piece of equipment    Resistance Training Performed Yes    VAD Patient? No    PAD/SET Patient? No      Pain Assessment   Currently in Pain? No/denies              Social History   Tobacco Use  Smoking Status Never Smoker  Smokeless Tobacco Never Used    Goals Met:  Independence with exercise equipment Exercise tolerated well No report of cardiac concerns or symptoms Strength training completed today  Goals Unmet:  Not Applicable  Comments: Pt able to follow exercise prescription today without complaint.  Will continue to monitor for progression.    Dr. Emily Filbert is Medical Director for [place] and [facility].

## 2020-02-07 ENCOUNTER — Telehealth: Payer: Self-pay

## 2020-02-07 ENCOUNTER — Other Ambulatory Visit: Payer: Self-pay

## 2020-02-07 ENCOUNTER — Encounter: Payer: Medicare Other | Admitting: *Deleted

## 2020-02-07 ENCOUNTER — Ambulatory Visit (INDEPENDENT_AMBULATORY_CARE_PROVIDER_SITE_OTHER): Payer: Medicare Other

## 2020-02-07 DIAGNOSIS — D500 Iron deficiency anemia secondary to blood loss (chronic): Secondary | ICD-10-CM

## 2020-02-07 DIAGNOSIS — Z8673 Personal history of transient ischemic attack (TIA), and cerebral infarction without residual deficits: Secondary | ICD-10-CM

## 2020-02-07 DIAGNOSIS — I272 Pulmonary hypertension, unspecified: Secondary | ICD-10-CM

## 2020-02-07 DIAGNOSIS — I270 Primary pulmonary hypertension: Secondary | ICD-10-CM | POA: Diagnosis not present

## 2020-02-07 LAB — POCT INR
INR: 1.9 — AB (ref 2.0–3.0)
Prothrombin Time: 22.9

## 2020-02-07 NOTE — Patient Instructions (Addendum)
Description   Current Dose: 35m on T,TH,Sat except 4 mg the other days. Changes: Take 439mdaily  Recheck in 1 weeks

## 2020-02-07 NOTE — Telephone Encounter (Signed)
Patient was advised of message and was given [facility] phone number to reach out to them to schedule appointment. Patient states that she will stay on her current dose of warfarin and will not change the dose. She states that she will come in next week to have lab work drawn.

## 2020-02-07 NOTE — Telephone Encounter (Signed)
Patient was given 3 hemo occult cards to test for blood in stool. Patient brought cards back today,01/31/2020 and all the cards was positive for blood. The dates collected was 01/25/2020,01/26/2020,01/30/2020. Message was send to [place] and she put in a referral to GI. The office tried to contact patient and no answer and they left voicemail for patient to call office back to schedule appointment.

## 2020-02-07 NOTE — Telephone Encounter (Signed)
She just came in to have her warfarin adjusted and I increased her from 4 mg and 3 mg every other day to 4 mg every day. Can we please let her know I want her to STAY at her old dose and not increase? Her hemoccult cards are positive and indicate she is losing blood. Ann Flynn has placed a referral to GI which has been trying to contact her. If she feels weak, dizzy, passes out, has visible blood in her stool or vomits blood, she should proceed to the ER. In the mean time, please stay at the old dose of warfarin and I will place a repeat CBC w diff and iron panel for her to get. I will reach out to her cardiologist and let him know.

## 2020-02-07 NOTE — Progress Notes (Signed)
Daily Session Note  Patient Details  Name: Ann Flynn MRN: [id number] Date of Birth: July 16, 1941 Referring Provider:     Pulmonary Rehab from 09/22/2019 in [facility] Cardiac and Pulmonary Rehab  Referring Provider Glori Bickers MD      Encounter Date: 02/07/2020  Check In:  Session Check In - 02/07/20 1214      Check-In   Supervising physician immediately available to respond to emergencies See telemetry face sheet for immediately available ER MD    Location [facility]-Cardiac & Pulmonary Rehab    Staff Present Heath Lark, RN, BSN, CCRP;Joseph Hood RCP,RRT,BSRT;Amanda Oletta Darter, [place], ACSM CEP, Exercise Physiologist    Virtual Visit No    Medication changes reported     No    Fall or balance concerns reported    No    Warm-up and Cool-down Performed on first and last piece of equipment    Resistance Training Performed Yes    VAD Patient? No    PAD/SET Patient? No      Pain Assessment   Currently in Pain? No/denies              Social History   Tobacco Use  Smoking Status Never Smoker  Smokeless Tobacco Never Used    Goals Met:  Proper associated with RPD/PD & O2 Sat Independence with exercise equipment Exercise tolerated well No report of cardiac concerns or symptoms  Goals Unmet:  Not Applicable  Comments: Pt able to follow exercise prescription today without complaint.  Will continue to monitor for progression.    Dr. Emily Filbert is Medical Director for [place] and [facility].

## 2020-02-07 NOTE — Progress Notes (Signed)
Patient advised via another message.

## 2020-02-08 ENCOUNTER — Telehealth (HOSPITAL_COMMUNITY): Payer: Self-pay | Admitting: *Deleted

## 2020-02-08 NOTE — Telephone Encounter (Signed)
Pt left VM wanting to make sure Dr [name] is aware of her anemia and blood in stool.  Per Dr [name], pt's pcp reached out to him and he is aware, will hold off on dccv at this time until she sees GI and gets a colonoscopy.   Pt is aware and agreeable

## 2020-02-09 ENCOUNTER — Encounter: Payer: Medicare Other | Admitting: *Deleted

## 2020-02-09 ENCOUNTER — Other Ambulatory Visit: Payer: Self-pay

## 2020-02-09 ENCOUNTER — Telehealth: Payer: Self-pay | Admitting: Gastroenterology

## 2020-02-09 DIAGNOSIS — I270 Primary pulmonary hypertension: Secondary | ICD-10-CM | POA: Diagnosis not present

## 2020-02-09 DIAGNOSIS — I272 Pulmonary hypertension, unspecified: Secondary | ICD-10-CM

## 2020-02-09 NOTE — Telephone Encounter (Signed)
I've tried calling the patient & the phone just rings so I have called the daughter(Becky Skeet Simmer) [phone]. I left a message asking her to have the patient contact the office for an appointment . [place] sent a message asking [place] to schedule an appointment for the patient with Dr Vicente Males 02-13-20 @ 11:00am

## 2020-02-09 NOTE — Progress Notes (Signed)
Daily Session Note  Patient Details  Name: Ann Flynn MRN: [id number] Date of Birth: 1941/01/23 Referring Provider:     Pulmonary Rehab from 09/22/2019 in [facility] Cardiac and Pulmonary Rehab  Referring Provider Glori Bickers MD      Encounter Date: 02/09/2020  Check In:  Session Check In - 02/09/20 1139      Check-In   Supervising physician immediately available to respond to emergencies See telemetry face sheet for immediately available ER MD    Location [facility]-Cardiac & Pulmonary Rehab    Staff Present Renita Papa, RN BSN;Joseph [address] [place], [place], [place], CCRP, [place], [place], ACSM CEP, Exercise Physiologist    Virtual Visit No    Medication changes reported     No    Fall or balance concerns reported    No    Warm-up and Cool-down Performed on first and last piece of equipment    Resistance Training Performed Yes    VAD Patient? No    PAD/SET Patient? No      Pain Assessment   Currently in Pain? No/denies              Social History   Tobacco Use  Smoking Status Never Smoker  Smokeless Tobacco Never Used    Goals Met:  Independence with exercise equipment Exercise tolerated well No report of cardiac concerns or symptoms Strength training completed today  Goals Unmet:  Not Applicable  Comments: Pt able to follow exercise prescription today without complaint.  Will continue to monitor for progression.    Dr. Emily Filbert is Medical Director for [place] and [facility].

## 2020-02-10 ENCOUNTER — Telehealth: Payer: Self-pay | Admitting: Physician Assistant

## 2020-02-10 ENCOUNTER — Other Ambulatory Visit: Payer: Self-pay | Admitting: Physician Assistant

## 2020-02-10 LAB — IRON,TIBC AND FERRITIN PANEL
Ferritin: 68 ng/mL (ref 15–150)
Iron Saturation: 8 % — CL (ref 15–55)
Iron: 21 ug/dL — ABNORMAL LOW (ref 27–139)
Total Iron Binding Capacity: 265 ug/dL (ref 250–450)
UIBC: 244 ug/dL (ref 118–369)

## 2020-02-10 LAB — SPECIMEN STATUS REPORT

## 2020-02-10 NOTE — Telephone Encounter (Signed)
Copied from [place] [phone]. Topic: Medicare AWV >> Feb 10, 2020 10:27 AM Cher Nakai R wrote: Reason for CRM:  Left message for patient to call back and schedule Medicare Annual Wellness Visit (AWV) either virtually or in office.  No hx of AWV; please schedule at anytime with [facility] Health Advisor.

## 2020-02-14 ENCOUNTER — Encounter: Payer: Medicare Other | Admitting: *Deleted

## 2020-02-14 ENCOUNTER — Ambulatory Visit (INDEPENDENT_AMBULATORY_CARE_PROVIDER_SITE_OTHER): Payer: Medicare Other

## 2020-02-14 ENCOUNTER — Other Ambulatory Visit: Payer: Self-pay

## 2020-02-14 DIAGNOSIS — I272 Pulmonary hypertension, unspecified: Secondary | ICD-10-CM

## 2020-02-14 DIAGNOSIS — I270 Primary pulmonary hypertension: Secondary | ICD-10-CM | POA: Diagnosis not present

## 2020-02-14 DIAGNOSIS — Z8673 Personal history of transient ischemic attack (TIA), and cerebral infarction without residual deficits: Secondary | ICD-10-CM | POA: Diagnosis not present

## 2020-02-14 LAB — POCT INR
INR: 2.5 (ref 2.0–3.0)
PT: 29.8

## 2020-02-14 NOTE — Patient Instructions (Signed)
Continue with 36m daily except 370mTues, Thurs, and Sat.  Recheck in one week.

## 2020-02-14 NOTE — Progress Notes (Signed)
Daily Session Note  Patient Details  Name: Ann Flynn MRN: [id number] Date of Birth: 13-Mar-1941 Referring Provider:     Pulmonary Rehab from 09/22/2019 in [facility] Cardiac and Pulmonary Rehab  Referring Provider Glori Bickers MD      Encounter Date: 02/14/2020  Check In:  Session Check In - 02/14/20 1123      Check-In   Supervising physician immediately available to respond to emergencies See telemetry face sheet for immediately available ER MD    Location [facility]-Cardiac & Pulmonary Rehab    Staff Present Renita Papa, RN BSN;Joseph [address] [place], [place], [place], CCRP, [place], [place], ACSM CEP, Exercise Physiologist;Kara Eliezer Bottom, MS Exercise Physiologist    Virtual Visit No    Medication changes reported     No    Warm-up and Cool-down Performed on first and last piece of equipment    Resistance Training Performed Yes    VAD Patient? No    PAD/SET Patient? No      Pain Assessment   Currently in Pain? No/denies              Social History   Tobacco Use  Smoking Status Never Smoker  Smokeless Tobacco Never Used    Goals Met:  Proper associated with RPD/PD & O2 Sat Independence with exercise equipment Using PLB without cueing & demonstrates good technique Exercise tolerated well No report of cardiac concerns or symptoms Strength training completed today  Goals Unmet:  Not Applicable  Comments: Pt able to follow exercise prescription today without complaint.  Will continue to monitor for progression.    Dr. Emily Filbert is Medical Director for [place] and [facility].

## 2020-02-15 LAB — CBC WITH DIFFERENTIAL/PLATELET
Basophils Absolute: 0 10*3/uL (ref 0.0–0.2)
Basos: 1 %
EOS (ABSOLUTE): 0.2 10*3/uL (ref 0.0–0.4)
Eos: 4 %
Hematocrit: 31.1 % — ABNORMAL LOW (ref 34.0–46.6)
Hemoglobin: 10 g/dL — ABNORMAL LOW (ref 11.1–15.9)
Immature Grans (Abs): 0 10*3/uL (ref 0.0–0.1)
Immature Granulocytes: 0 %
Lymphocytes Absolute: 1.3 10*3/uL (ref 0.7–3.1)
Lymphs: 28 %
MCH: 29 pg (ref 26.6–33.0)
MCHC: 32.2 g/dL (ref 31.5–35.7)
MCV: 90 fL (ref 79–97)
Monocytes Absolute: 0.5 10*3/uL (ref 0.1–0.9)
Monocytes: 11 %
Neutrophils Absolute: 2.7 10*3/uL (ref 1.4–7.0)
Neutrophils: 56 %
Platelets: 219 10*3/uL (ref 150–450)
RBC: 3.45 x10E6/uL — ABNORMAL LOW (ref 3.77–5.28)
RDW: 16.2 % — ABNORMAL HIGH (ref 11.7–15.4)
WBC: 4.7 10*3/uL (ref 3.4–10.8)

## 2020-02-15 LAB — IRON,TIBC AND FERRITIN PANEL
Ferritin: 45 ng/mL (ref 15–150)
Iron Saturation: 16 % (ref 15–55)
Iron: 45 ug/dL (ref 27–139)
Total Iron Binding Capacity: 283 ug/dL (ref 250–450)
UIBC: 238 ug/dL (ref 118–369)

## 2020-02-16 ENCOUNTER — Encounter: Payer: Medicare Other | Attending: Internal Medicine | Admitting: *Deleted

## 2020-02-16 ENCOUNTER — Other Ambulatory Visit: Payer: Self-pay

## 2020-02-16 DIAGNOSIS — I272 Pulmonary hypertension, unspecified: Secondary | ICD-10-CM

## 2020-02-16 DIAGNOSIS — I270 Primary pulmonary hypertension: Secondary | ICD-10-CM | POA: Diagnosis present

## 2020-02-16 DIAGNOSIS — R0602 Shortness of breath: Secondary | ICD-10-CM | POA: Diagnosis present

## 2020-02-16 DIAGNOSIS — Z79899 Other long term (current) drug therapy: Secondary | ICD-10-CM | POA: Diagnosis not present

## 2020-02-16 DIAGNOSIS — E118 Type 2 diabetes mellitus with unspecified complications: Secondary | ICD-10-CM | POA: Diagnosis not present

## 2020-02-16 DIAGNOSIS — Z794 Long term (current) use of insulin: Secondary | ICD-10-CM | POA: Insufficient documentation

## 2020-02-16 DIAGNOSIS — Z7901 Long term (current) use of anticoagulants: Secondary | ICD-10-CM | POA: Diagnosis not present

## 2020-02-16 NOTE — Progress Notes (Signed)
Daily Session Note  Patient Details  Name: Ann Flynn MRN: [id number] Date of Birth: 11-Sep-1940 Referring Provider:     Pulmonary Rehab from 09/22/2019 in [facility] Cardiac and Pulmonary Rehab  Referring Provider Glori Bickers MD      Encounter Date: 02/16/2020  Check In:  Session Check In - 02/16/20 1133      Check-In   Supervising physician immediately available to respond to emergencies See telemetry face sheet for immediately available ER MD    Location [facility]-Cardiac & Pulmonary Rehab    Staff Present Nyoka Cowden, RN, BSN, Tyna Jaksch, MS Exercise Physiologist;Amanda Oletta Darter, [place], ACSM CEP, Exercise Physiologist    Virtual Visit No    Medication changes reported     No    Fall or balance concerns reported    No    Tobacco Cessation No Change    Warm-up and Cool-down Performed on first and last piece of equipment    Resistance Training Performed Yes    VAD Patient? No    PAD/SET Patient? No      Pain Assessment   Currently in Pain? No/denies              Social History   Tobacco Use  Smoking Status Never Smoker  Smokeless Tobacco Never Used    Goals Met:  Independence with exercise equipment Exercise tolerated well No report of cardiac concerns or symptoms Strength training completed today  Goals Unmet:  Not Applicable  Comments: Pt able to follow exercise prescription today without complaint.  Will continue to monitor for progression.   Dr. Emily Filbert is Medical Director for [place] and [facility].

## 2020-02-17 ENCOUNTER — Other Ambulatory Visit: Payer: Self-pay

## 2020-02-17 ENCOUNTER — Ambulatory Visit: Payer: Medicare Other | Admitting: Pharmacist

## 2020-02-17 DIAGNOSIS — E119 Type 2 diabetes mellitus without complications: Secondary | ICD-10-CM

## 2020-02-17 DIAGNOSIS — E785 Hyperlipidemia, unspecified: Secondary | ICD-10-CM

## 2020-02-17 NOTE — Progress Notes (Signed)
Chronic Care Management Pharmacy  Name: Ann Flynn  MRN: [id number] DOB: August 05, 1941  Chief Complaint/ HPI  Ann Flynn,  79 y.o. , female presents for their Initial CCM visit with the clinical pharmacist via telephone due to COVID-19 Pandemic.  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 acetaminophen (TYLENOL) 500 MG tablet Take 500 mg by mouth 2 (two) times daily as needed for moderate pain.  [name] ambrisentan (LETAIRIS) 5 MG tablet Take 1 tablet (5 mg total) by mouth daily.  [name] amLODipine (NORVASC) 5 MG tablet TAKE ONE TABLET EVERY DAY  . atorvastatin (LIPITOR) 80 MG tablet TAKE 1 TABLET BY MOUTH DAILY  . BD PEN NEEDLE NANO U/F 32G X 4 MM MISC USE TO INJECT ONCE DAILY AS DIRECTED  . Cyanocobalamin (B-12 PO) Take 1 tablet by mouth daily.  [name] docusate sodium (COLACE) 100 MG capsule Take 100 mg by mouth at bedtime.  . Dulaglutide (TRULICITY) 9.76 BH/4.1PF SOPN Inject 0.75 mg into the skin once a week.  . empagliflozin (JARDIANCE) 10 MG TABS tablet Take 1 tablet (10 mg total) by mouth daily before breakfast.  . ferrous sulfate 325 (65 FE) MG EC tablet Take 1 tablet (325 mg total) by mouth 3 (three) times daily with meals.  [name] losartan (COZAAR) 100 MG tablet TAKE ONE TABLET EVERY DAY  . metoprolol tartrate (LOPRESSOR) 25 MG tablet TAKE 1 TABLET BY MOUTH TWICE DAILY  . omeprazole (PRILOSEC) 40 MG capsule TAKE 1 CAPSULE BY MOUTH ONCE DAILY  . ONETOUCH ULTRA test strip USE AS DIRECTED  . tadalafil, PAH,  (ADCIRCA) 20 MG tablet Take 2 tablets (40 mg total) by mouth daily.  [name] torsemide (DEMADEX) 20 MG tablet Take 2 tablets (40 mg total) by mouth daily.  [name] SOLOSTAR 300 UNIT/ML Solostar Pen INJECT 50 UNITS INTO THE SKIN DAILY  . venlafaxine (EFFEXOR) 75 MG tablet TAKE ONE TABLET EVERY DAY  . VITAMIN D PO Take 1 capsule by mouth daily.  [name] warfarin (COUMADIN) 3 MG tablet TAKE 1 TABLET BY MOUTH ON MONDAY WEDNESDAY AND FRIDAY ALONG WITH 4MG ON THE REMAINING DAYS   No facility-administered encounter medications on file as of 02/17/2020.      Financial Resource Strain:   . Difficulty of Paying Living Expenses:    Current Diagnosis/Assessment:  Goals Addressed   None    Hypertension    Office blood pressures are  BP Readings from Last 3 Encounters:  01/30/20 120/60  01/27/20 116/60  01/18/20 128/68    Patient has failed these meds in the past: NA  Patient checks BP at home daily  Patient home BP readings are ranging: 120/62  We discussed: At goal BP  Plan  Continue current medications   Diabetes   Recent Relevant Labs: Lab Results  Component Value Date/Time   HGBA1C 7.3 (A) 01/18/2020 01:12 PM   HGBA1C 7.3 (A) 07/06/2019 09:58 AM   HGBA1C 7.2 (H) 03/28/2019 04:11 PM     Checking BG:    Recent  FBG Readings: 149 - 160 fasting Patient has failed these meds in past: NA Patient is currently uncontrolled on the following medications: Jardiance, Toujeo  Last diabetic Foot exam:  Lab Results  Component Value Date/Time   HMDIABEYEEXA Retinopathy (A) 09/28/2019 12:00 AM    Last diabetic Eye exam: No results found for: HMDIABFOOTEX   We discussed:  Jardiance effect not reflected in results   Plan  Continue current medications   Heart Failure   Type: Diastolic  Last ejection fraction: 55-60% NYHA Class: III (marked limitation of activity) AHA HF Stage: D (Advanced disease requiring aggressive medical therapy)  Patient has failed these meds in past:  furosemide Patient is currently controlled on the following medications: losartan, metoprolol, torsemide, warfarin  We discussed  Torsemide working better than Lasix Pulmonary rehab is beneficial Replace warfarin with Eliquis and order   Plan  Continue current medications  Hyperlipidemia   LDL goal < 70  Lipid Panel      Component Value Date/Time   CHOL 104 01/18/2020 1155   TRIG 68 01/18/2020 1155   HDL 43 01/18/2020 1155   LDLCALC 46 01/18/2020 1155    Hepatic Function Latest Ref Rng & Units 01/27/2020 01/18/2020 03/28/2019  Total Protein 6.5 - 8.1 g/dL 6.0(L) 6.1 6.1  Albumin 3.5 - 5.0 g/dL 3.5 4.0 3.9  AST 15 - 41 U/L _0 ALT 0 - 44 U/L _1 Alk Phosphatase 38 - 126 U/L 56 77 81  Total Bilirubin 0.3 - 1.2 mg/dL 0.5 0.3 0.2     The ASCVD Risk score ([place]., et al., 2013) failed to calculate for the following reasons:   The patient has a prior MI or stroke diagnosis   Patient has failed these meds in past: NA Patient is currently controlled on the following medications:  . Lipitor 65m daily  We discussed:   At goal Denies myalgias  Plan  Continue current medications   Medication Management   Pt uses [facility] for all medications Uses pill box? Yes Pt endorses 100% compliance  Interested in [facility], but wants to check with [facility] about pill packs  We discussed:  Tapering off of Effexor 739mhemocult positive PAP for Eliquis also?  Plan  Continue current medication management strategy  Follow up: 3 month phone visit  TeMilus HeightPharmD, [phone]

## 2020-02-19 NOTE — Patient Instructions (Addendum)
Visit Information  Goals Addressed            This Visit's Progress   . Chronic Care Management       CARE PLAN ENTRY (see longitudinal plan of care for additional care plan information)  Current Barriers:  . Chronic Disease Management support, education, and care coordination needs related to Hypertension, Hyperlipidemia, Diabetes, and Chronic Kidney Disease   Hypertension BP Readings from Last 3 Encounters:  01/30/20 120/60  01/27/20 116/60  01/18/20 128/68   . Pharmacist Clinical Goal(s): o Over the next 90 days, patient will work with PharmD and providers to maintain BP goal <130/80 . Current regimen:  o Amlodipine 5m daily o Metoprolol 235mtwice daily o Losartan 10055maily . Interventions: o None . Patient self care activities - Over the next 90 days, patient will: o Check BP weekly, document, and provide at future appointments o Ensure daily salt intake < 2300 mg/day  Hyperlipidemia Lab Results  Component Value Date/Time   LDLCALC 46 01/18/2020 11:55 AM   . Pharmacist Clinical Goal(s): o Over the next 90 days, patient will work with PharmD and providers to maintain LDL goal < 70 . Current regimen:  o Lipitor 80m64mily . Interventions: o None . Patient self care activities - Over the next 90 days, patient will: o Take atorvastatin 80mg5mly o Report any unexplained muscle pain to provider or PharmD  Diabetes Lab Results  Component Value Date/Time   HGBA1C 7.3 (A) 01/18/2020 01:12 PM   HGBA1C 7.3 (A) 07/06/2019 09:58 AM   HGBA1C 7.2 (H) 03/28/2019 04:11 PM   . Pharmacist Clinical Goal(s): o Over the next 90 days, patient will work with PharmD and providers to achieve A1c goal <7% . Current regimen:  o Jardiance 10mg 19my o Toujeo inject 50 unit daily . Interventions: o Trulicity 0.75mg7.74JOt once weekly o Patient Assistance Program . Patient self care activities - Over the next 90 days, patient will: o Check blood sugar once daily, document,  and provide at future appointments o Contact provider with any episodes of hypoglycemia  Medication management . Pharmacist Clinical Goal(s): o Over the next 90 days, patient will work with PharmD and providers to achieve optimal medication adherence . Current pharmacy: [facility] . Interventions o Comprehensive medication review performed. o Continue current medication management strategy . Patient self care activities - Over the next 90 days, patient will: o Focus on medication adherence by considering switch to [facility] for delivery, synchronization, pill packs and a [facility] as prescribed o Report any questions or concerns to PharmD and/or provider(s)  Initial goal documentation        Ms. EdwardGallegoiven information about Chronic Care Management services today including:  1. CCM service includes personalized support from designated clinical staff supervised by her physician, including individualized plan of care and coordination with other care providers 2. 24/7 contact phone numbers for assistance for urgent and routine care needs. 3. Standard insurance, coinsurance, copays and deductibles apply for chronic care management only during months in which we provide at least 20 minutes of these services. Most insurances cover these services at 100%, however patients may be responsible for any copay, coinsurance and/or deductible if applicable. This service may help you avoid the need for more expensive face-to-face services. 4. Only one practitioner may furnish and bill the service in a calendar month. 5. The patient may stop CCM services at any  time (effective at the end of the month) by phone call to the office staff.  Patient agreed to services and verbal consent obtained.   Print copy of patient instructions provided.  Telephone follow up appointment with pharmacy team member scheduled for: 3  months  Milus Height, PharmD, [place], [place] [phone]   Insulin Treatment for Diabetes Mellitus Diabetes (diabetes mellitus) is a long-term (chronic) disease. It occurs when the body does not properly use sugar (glucose) that is released from food after digestion. Glucose levels are controlled by a hormone called insulin. Insulin is made in the pancreas, which is an organ behind the stomach.  If you have type 1 diabetes, you must take insulin because your pancreas does not make any.  If you have type 2 diabetes, you might need to take insulin along with other medicines. In type 2 diabetes, one or both of these problems may be present: ? The pancreas does not make enough insulin. ? Cells in the body do not respond properly to insulin that the body makes (insulin resistance). You must use insulin correctly to control your diabetes. You must have some insulin in your body at all times. Insulin treatment varies depending on your type of diabetes, your treatment goals, and your medical history. Ask questions to understand your insulin treatment plan so you can be an active partner in managing your diabetes. How is insulin given? Insulin can only be given through a shot (injection). It is injected using a syringe and needle, an insulin pen, a pump, or a jet injector. Your health care provider will:  Prescribe the type and amount of insulin that you need.  Tell you when you should inject your insulin. Where on the body should insulin be injected? Insulin is injected into a layer of fatty tissue under the skin. Good places to inject insulin include:  Abdomen. Generally, the abdomen is the best place to inject insulin. However, you should avoid any area that is less than 2 inches (5 cm) from the belly button (navel).  Front of thigh.  Upper, outer side of thigh.  Upper, outer side of arm.  Upper, outer part of buttock. It is important to:  Give  your injection in a slightly different place each time. This helps to prevent irritation and improve absorption.  Avoid injecting into areas that have scar tissue. Usually, you will give yourself insulin injections. Others can also be taught how to give you injections. You will use a special type of syringe that is made only for insulin. Some people may have an insulin pump that delivers insulin steadily through a tube (cannula) that is placed under the skin. What are the different types of insulin? The following information is a general guide to different types of insulin. Specifics vary depending on the insulin product that your health care provider prescribes.  Rapid-acting insulin: ? Starts working quickly, in as little as 5 minutes. ? Can last for 4-6 hours (or sometimes longer). ? Works well when taken right before a meal to quickly lower your blood glucose.  Short-acting insulin: ? Starts working in about 30 minutes. ? Can last for 6-10 hours. ? Should be taken about 30 minutes before you start eating a meal.  Intermediate-acting insulin: ? Starts working in 1-2 hours. ? Lasts for about 10-18 hours. ? Lowers your blood glucose for a longer period of time, but it is not as effective for lowering blood glucose right after a meal.  Long-acting  insulin: ? Mimics the small amount of insulin that your pancreas usually produces throughout the day. ? Should be used one or two times a day. ? Is usually used in combination with other types of insulin or other medicines.  Concentrated insulin, or U-500 insulin: ? Contains a higher dose of insulin than most rapid-acting insulins. U-500 insulin has 5 times the amount of insulin per 1 mL. ? Should only be used with the special U-500 syringe or U-500 insulin pen. It is dangerous to use the wrong type of syringe with this insulin. What are the side effects of insulin? Possible side effects of insulin treatment include:  Low blood glucose  (hypoglycemia).  Weight gain.  High blood glucose (hyperglycemia).  Skin injury or irritation. Some of these side effects can be caused by using improper injection technique. Be sure to learn how to inject insulin properly. What are common terms associated with insulin treatment? Some terms that you might hear include:  Basal insulin, or basal rate. This is the constant amount of insulin that needs to be present in your body to keep your blood glucose levels stable. People who have type 1 diabetes need basal insulin in a steady (continuous) dose 24 hours a day. ? Usually, intermediate-acting or long-acting insulin is used one or two times a day to manage basal insulin levels. ? Medicines that are taken by mouth may also be recommended to manage basal insulin levels.  Prandial or nutrition insulin. This refers to meal-related insulin. ? Blood glucose rises quickly after a meal (postprandial). Rapid-acting or short-acting insulin can be used right before a meal (preprandial) to quickly lower your blood glucose. ? You may be instructed to adjust the amount of prandial insulin that you take, based on how much carbohydrate (starch) is in your meal.  Corrective insulin. This may also be called a correction dose or supplemental dose. This is a small amount of rapid-acting or short-acting insulin that can be used to lower your blood glucose if it is too high. You may be instructed to check your blood glucose at certain times of the day and use corrective insulin as needed.  Tight control, or intensive therapy. This means keeping your blood glucose as close to your target as possible, and preventing your blood glucose from getting too high after meals. People who have tight control of their diabetes have fewer long-term problems caused by diabetes. Follow these instructions at home: Talk with your health care provider or pharmacist about the type of insulin you should take and when you should take it.  You should know when your insulin goes up the most (peaks) and when it wears off. You need this information so you can plan your meals and exercise. Work with your health care provider to:  Check your blood glucose every day. Your health care provider will tell you how often and when you should do this.  Manage your: ? Weight. ? Blood pressure. ? Cholesterol. ? Stress.  Eat a healthy diet.  Exercise regularly. Summary  Diabetes is a long-term (chronic) disease. It occurs when the body does not properly use sugar (glucose) that is released from food after digestion. Glucose levels are controlled by a hormone called insulin, which is made in an organ behind your stomach (pancreas).  You must use insulin correctly to control your diabetes. You must have some insulin in your body at all times.  Insulin treatment varies depending on your type of diabetes, your treatment goals, and your medical  history.  Talk with your health care provider or pharmacist about the type of insulin you should take and when you should take it.  Check your blood glucose every day. Your health care provider will tell you how often and when you should check it. This information is not intended to replace advice given to you by your health care provider. Make sure you discuss any questions you have with your health care provider. Document Revised: 08/07/2017 Document Reviewed: 09/07/2015 Elsevier Patient Education  2020 [employer].

## 2020-02-21 ENCOUNTER — Other Ambulatory Visit: Payer: Self-pay

## 2020-02-21 ENCOUNTER — Ambulatory Visit (INDEPENDENT_AMBULATORY_CARE_PROVIDER_SITE_OTHER): Payer: Medicare Other

## 2020-02-21 ENCOUNTER — Encounter: Payer: Medicare Other | Admitting: *Deleted

## 2020-02-21 DIAGNOSIS — I270 Primary pulmonary hypertension: Secondary | ICD-10-CM | POA: Diagnosis not present

## 2020-02-21 DIAGNOSIS — Z8673 Personal history of transient ischemic attack (TIA), and cerebral infarction without residual deficits: Secondary | ICD-10-CM

## 2020-02-21 DIAGNOSIS — I272 Pulmonary hypertension, unspecified: Secondary | ICD-10-CM

## 2020-02-21 LAB — POCT INR
INR: 2.2 (ref 2.0–3.0)
PT: 26.1

## 2020-02-21 NOTE — Patient Instructions (Signed)
Discharge Patient Instructions  Patient Details  Name: Ann Flynn MRN: [id number] Date of Birth: 1940-09-18 Referring Provider:  Jolaine Artist, MD   Number of Visits: 56  Reason for Discharge:  Patient reached a stable level of exercise. Patient independent in their exercise. Patient has met program and personal goals.  Smoking History:  Social History   Tobacco Use  Smoking Status Never Smoker  Smokeless Tobacco Never Used    Diagnosis:  Pulmonary hypertension ([place])  Initial Exercise Prescription:  Initial Exercise Prescription - 09/22/19 1500      Date of Initial Exercise RX and Referring Provider   Date 09/22/19    Referring Provider Bensimhon, Daniel MD      Oxygen   Oxygen Continuous    Liters 3      Treadmill   MPH 0.8    Grade 0    Minutes 15    METs 1.6      NuStep   Level 1    SPM 80    Minutes 15    METs 1.5      T5 Nustep   Level 1    SPM 80    Minutes 15    METs 1.5      Biostep-RELP   Level 1    SPM 50    Minutes 15    METs 1      Prescription Details   Frequency (times per week) 2    Duration Progress to 30 minutes of continuous aerobic without signs/symptoms of physical distress      Intensity   THRR 40-80% of Max Heartrate 94-126    Ratings of Perceived Exertion 11-13    Perceived Dyspnea 0-4      Progression   Progression Continue to progress workloads to maintain intensity without signs/symptoms of physical distress.      Resistance Training   Training Prescription Yes    Weight 3 lb    Reps 10-15           Discharge Exercise Prescription (Final Exercise Prescription Changes):  Exercise Prescription Changes - 02/14/20 1400      Response to Exercise   Blood Pressure (Admit) 118/64    Blood Pressure (Exercise) 126/62    Blood Pressure (Exit) 118/64    Heart Rate (Admit) 67 bpm    Heart Rate (Exercise) 79 bpm    Heart Rate (Exit) 68 bpm    Oxygen Saturation (Admit) 95 %    Oxygen Saturation (Exercise)  98 %    Oxygen Saturation (Exit) 98 %    Rating of Perceived Exertion (Exercise) 11    Perceived Dyspnea (Exercise) 1    Symptoms none    Duration Continue with 30 min of aerobic exercise without signs/symptoms of physical distress.    Intensity THRR unchanged      Progression   Progression Continue to progress workloads to maintain intensity without signs/symptoms of physical distress.    Average METs 1.98      Resistance Training   Training Prescription Yes    Weight 3 lb    Reps 10-15      Interval Training   Interval Training No      Oxygen   Oxygen Continuous    Liters 3      Treadmill   MPH 0.8    Grade 0    Minutes 15    METs 1.6      NuStep   Level 5    Minutes 15    METs  2      T5 Nustep   Level 1    Minutes 15    METs 1.8      Home Exercise Plan   Plans to continue exercise at Home (comment)   walking, staff videos   Frequency Add 2 additional days to program exercise sessions.    Initial Home Exercises Provided 11/15/19           Functional Capacity:  6 Minute Walk    Row Name 09/22/19 1534 02/09/20 1143       6 Minute Walk   Phase Initial --    Distance 400 feet 535 feet    Distance % Change -- 33 %    Distance Feet Change -- 135 ft    Walk Time 5 minutes 5.3 minutes    # of Rest Breaks 0  stopped at 5 min 2    MPH 0.91 1.15    METS 0.56 0.45    RPE 17 14    Perceived Dyspnea  3 3    VO2 Peak 1.95 1.6    Symptoms Yes (comment) No    Comments dizzy, hips hurting 8/10 --    Resting HR 62 bpm 59 bpm    Resting BP 134/72 126/62    Resting Oxygen Saturation  99 % 100 %    Exercise Oxygen Saturation  during 6 min walk 86 % 88 %    Max Ex. HR 120 bpm 105 bpm    Max Ex. BP 152/84 120/60    2 Minute Post BP 134/74 --      Interval HR   1 Minute HR 95 82    2 Minute HR 113 93    3 Minute HR 117 91    4 Minute HR 119 105    5 Minute HR 120 105    6 Minute HR 111 94    2 Minute Post HR 87 --    Interval Heart Rate? Yes Yes       Interval Oxygen   Interval Oxygen? Yes Yes    Baseline Oxygen Saturation % 99 % 100 %    1 Minute Oxygen Saturation % 95 % 94 %    1 Minute Liters of Oxygen 3 L 4 L  pulsed    2 Minute Oxygen Saturation % 94 % 94 %    2 Minute Liters of Oxygen 3 L 4 L    3 Minute Oxygen Saturation % 91 % 90 %    3 Minute Liters of Oxygen 3 L 4 L    4 Minute Oxygen Saturation % 85 % 94 %    4 Minute Liters of Oxygen 3 L 4 L    5 Minute Oxygen Saturation % 86 % 93 %    5 Minute Liters of Oxygen 3 L 4 L    6 Minute Oxygen Saturation % 90 % 88 %    6 Minute Liters of Oxygen 3 L 4 L    2 Minute Post Oxygen Saturation % 98 % --    2 Minute Post Liters of Oxygen 3 L 4 L           Quality of Life:   Personal Goals: Goals established at orientation with interventions provided to work toward goal.  Personal Goals and Risk Factors at Admission - 09/21/19 0856      Core Components/Risk Factors/Patient Goals on Admission    Weight Management Yes;Weight Loss    Intervention Weight Management: Develop  a combined nutrition and exercise program designed to reach desired caloric intake, while maintaining appropriate intake of nutrient and fiber, sodium and fats, and appropriate energy expenditure required for the weight goal.;Weight Management: Provide education and appropriate resources to help participant work on and attain dietary goals.    Expected Outcomes Short Term: Continue to assess and modify interventions until short term weight is achieved;Long Term: Adherence to nutrition and physical activity/exercise program aimed toward attainment of established weight goal;Weight Loss: Understanding of general recommendations for a balanced deficit meal plan, which promotes 1-2 lb weight loss per week and includes a negative energy balance of [phone] kcal/d;Understanding recommendations for meals to include 15-35% energy as protein, 25-35% energy from fat, 35-60% energy from carbohydrates, less than 258m of dietary  cholesterol, 20-35 gm of total fiber daily;Understanding of distribution of calorie intake throughout the day with the consumption of 4-5 meals/snacks    Diabetes Yes    Intervention Provide education about signs/symptoms and action to take for hypo/hyperglycemia.;Provide education about proper nutrition, including hydration, and aerobic/resistive exercise prescription along with prescribed medications to achieve blood glucose in normal ranges: Fasting glucose 65-99 mg/dL    Expected Outcomes Short Term: Participant verbalizes understanding of the signs/symptoms and immediate care of hyper/hypoglycemia, proper foot care and importance of medication, aerobic/resistive exercise and nutrition plan for blood glucose control.;Long Term: Attainment of HbA1C < 7%.    Heart Failure Yes    Intervention Provide a combined exercise and nutrition program that is supplemented with education, support and counseling about heart failure. Directed toward relieving symptoms such as shortness of breath, decreased exercise tolerance, and extremity edema.    Expected Outcomes Improve functional capacity of life;Short term: Attendance in program 2-3 days a week with increased exercise capacity. Reported lower sodium intake. Reported increased fruit and vegetable intake. Reports medication compliance.;Short term: Daily weights obtained and reported for increase. Utilizing diuretic protocols set by physician.;Long term: Adoption of self-care skills and reduction of barriers for early signs and symptoms recognition and intervention leading to self-care maintenance.    Hypertension Yes    Intervention Provide education on lifestyle modifcations including regular physical activity/exercise, weight management, moderate sodium restriction and increased consumption of fresh fruit, vegetables, and low fat dairy, alcohol moderation, and smoking cessation.;Monitor prescription use compliance.    Expected Outcomes Short Term: Continued  assessment and intervention until BP is < 140/989mHG in hypertensive participants. < 130/8065mG in hypertensive participants with diabetes, heart failure or chronic kidney disease.;Long Term: Maintenance of blood pressure at goal levels.    Lipids Yes    Intervention Provide education and support for participant on nutrition & aerobic/resistive exercise along with prescribed medications to achieve LDL <28m82mDL >40mg64m Expected Outcomes Short Term: Participant states understanding of desired cholesterol values and is compliant with medications prescribed. Participant is following exercise prescription and nutrition guidelines.;Long Term: Cholesterol controlled with medications as prescribed, with individualized exercise RX and with personalized nutrition plan. Value goals: LDL < 28mg,33m > 40 mg.            Personal Goals Discharge:  Goals and Risk Factor Review - 02/14/20 1142      Core Components/Risk Factors/Patient Goals Review   Personal Goals Review Weight Management/Obesity;Improve shortness of breath with ADL's;Hypertension;Lipids;Heart Failure    Review Chayse Wana she has been keeping up with ADL's, feels she has been doing better completing her home tasks. She has not set up a consultation or appointment with [facility]  yet but was encouraged to call back and make contact. She plans to use chair exercises when she watches TV at home. BP's have been stable and is monitored at home. Helmi thinks her SOB has improved since starting rehab. Will continue to monitor weight. Taking all medications as directed.    Expected Outcomes Short: Continue staying compliant with medications, monitor appropriate ranges for BP, weight, and lipids Long: Continue to manage risk factors           Exercise Goals and Review:  Exercise Goals    Row Name 09/22/19 1540             Exercise Goals   Increase Physical Activity Yes       Intervention Provide advice, education, support and  counseling about physical activity/exercise needs.;Develop an individualized exercise prescription for aerobic and resistive training based on initial evaluation findings, risk stratification, comorbidities and participant's personal goals.       Expected Outcomes Short Term: Attend rehab on a regular basis to increase amount of physical activity.;Long Term: Add in home exercise to make exercise part of routine and to increase amount of physical activity.;Long Term: Exercising regularly at least 3-5 days a week.       Increase Strength and Stamina Yes       Intervention Provide advice, education, support and counseling about physical activity/exercise needs.;Develop an individualized exercise prescription for aerobic and resistive training based on initial evaluation findings, risk stratification, comorbidities and participant's personal goals.       Expected Outcomes Short Term: Increase workloads from initial exercise prescription for resistance, speed, and METs.;Short Term: Perform resistance training exercises routinely during rehab and add in resistance training at home;Long Term: Improve cardiorespiratory fitness, muscular endurance and strength as measured by increased METs and functional capacity (6MWT)       Able to understand and use rate of perceived exertion (RPE) scale Yes       Intervention Provide education and explanation on how to use RPE scale       Expected Outcomes Short Term: Able to use RPE daily in rehab to express subjective intensity level;Long Term:  Able to use RPE to guide intensity level when exercising independently       Able to understand and use Dyspnea scale Yes       Intervention Provide education and explanation on how to use Dyspnea scale       Expected Outcomes Short Term: Able to use Dyspnea scale daily in rehab to express subjective sense of shortness of breath during exertion;Long Term: Able to use Dyspnea scale to guide intensity level when exercising independently        Knowledge and understanding of Target Heart Rate Range (THRR) Yes       Intervention Provide education and explanation of THRR including how the numbers were predicted and where they are located for reference       Expected Outcomes Short Term: Able to state/look up THRR;Short Term: Able to use daily as guideline for intensity in rehab;Long Term: Able to use THRR to govern intensity when exercising independently       Able to check pulse independently Yes       Intervention Provide education and demonstration on how to check pulse in carotid and radial arteries.;Review the importance of being able to check your own pulse for safety during independent exercise       Expected Outcomes Short Term: Able to explain why pulse checking is important during independent exercise;Long  Term: Able to check pulse independently and accurately       Understanding of Exercise Prescription Yes       Intervention Provide education, explanation, and written materials on patient's individual exercise prescription       Expected Outcomes Short Term: Able to explain program exercise prescription;Long Term: Able to explain home exercise prescription to exercise independently              Exercise Goals Re-Evaluation:  Exercise Goals Re-Evaluation    Row Name 09/27/19 1122 10/19/19 0942 11/01/19 1132 11/14/19 1339 11/15/19 1117     Exercise Goal Re-Evaluation   Exercise Goals Review Increase Physical Activity;Able to understand and use rate of perceived exertion (RPE) scale;Knowledge and understanding of Target Heart Rate Range (THRR);Understanding of Exercise Prescription;Increase Strength and Stamina;Able to understand and use Dyspnea scale;Able to check pulse independently Increase Physical Activity;Increase Strength and Stamina;Understanding of Exercise Prescription Increase Physical Activity;Increase Strength and Stamina;Able to understand and use rate of perceived exertion (RPE) scale;Able to understand and  use Dyspnea scale;Knowledge and understanding of Target Heart Rate Range (THRR);Able to check pulse independently;Understanding of Exercise Prescription Increase Physical Activity;Increase Strength and Stamina;Understanding of Exercise Prescription Increase Physical Activity;Increase Strength and Stamina;Understanding of Exercise Prescription   Comments Reviewed RPE scale, THR and program prescription with pt today.  Pt voiced understanding and was given a copy of goals to take home. Ajanay is off to a good start in rehab. She is already up to the full 15 min on the treadmill. We will continue to monitor her progress. Audrie has increased from .6-.8 on TM.  Staff will monitor progress. Vonne is doing well in rehab.  She is now up to 2 METs on the BioStep.  We will encourage her to increase workloads and continue to monitor her progress. Amiel is doing well in rehab.  She over did it some trying to get ready for company, but she was able.  She is doing some exericse at home.  Reviewed home exercise with pt today.  Pt plans to walk and use videos at home for exercise.  Reviewed THR, pulse, RPE, sign and symptoms, and when to call 911 or MD.  Also discussed weather considerations and indoor options.  Pt voiced understanding.   Expected Outcomes Short: Use RPE daily to regulate intensity. Long: Follow program prescription in THR. Short: Continue to attend rehab regularly.  Long: Continue to follow program prescription. Short:  attend class consistently Long : improve overall stamina Short: Increase workloads.  Long: Continue to improve stamina. Short: Start to add in more exercise at home.  Long: Continue to improve stamina.   [place] Name 11/29/19 1332 12/12/19 1539 12/20/19 1554 12/27/19 1132 01/02/20 1616     Exercise Goal Re-Evaluation   Exercise Goals Review Increase Physical Activity;Increase Strength and Stamina;Able to understand and use rate of perceived exertion (RPE) scale;Able to understand and use Dyspnea  scale;Knowledge and understanding of Target Heart Rate Range (THRR);Able to check pulse independently;Understanding of Exercise Prescription Increase Physical Activity;Increase Strength and Stamina;Understanding of Exercise Prescription Increase Physical Activity;Increase Strength and Stamina;Able to understand and use rate of perceived exertion (RPE) scale;Able to understand and use Dyspnea scale;Knowledge and understanding of Target Heart Rate Range (THRR);Able to check pulse independently;Understanding of Exercise Prescription Increase Physical Activity;Increase Strength and Stamina;Able to understand and use rate of perceived exertion (RPE) scale;Able to understand and use Dyspnea scale;Knowledge and understanding of Target Heart Rate Range (THRR);Able to check pulse independently;Understanding of Exercise Prescription Increase Physical Activity;Increase  Strength and Stamina;Understanding of Exercise Prescription   Comments Grettell has attended consistently and is up to level 4 on NS.  She works in correct THR and RPE range. Danaye is doing well in rehab.  She is doing her whole 15-20 min on the treadmill now.  We will move up her workload on the T5 NuStep.  We will continue to monitor her progress. Jenevie missed some sessioins this month.  She will see more progress with more consistent attendance. Kei walks at home inside her house.  She plans to try staff videos. Eylin has been doing well in rehab.  She is now up to level 5 on the NuStep.  We will continue to monitor her progress.   Expected Outcomes Short:  increase level on Biostep Long:  improve overall stamina Short: Increase workloads  Long: Continue to improve stamina. Short - attend/exercsie at home 3 days per week Long:  improve stamina/MET level Short: add in videos for exercise at least one other day Long: improve stamina Short: Increase SPM on both NuStep and BioStep for higher METs  Long: Continue to improve stamina.   [place] Name 01/19/20 1219  02/02/20 1429 02/14/20 1136         Exercise Goal Re-Evaluation   Exercise Goals Review Increase Physical Activity;Increase Strength and Stamina;Understanding of Exercise Prescription Increase Physical Activity;Increase Strength and Stamina;Understanding of Exercise Prescription Increase Physical Activity;Increase Strength and Stamina;Understanding of Exercise Prescription;Able to understand and use Dyspnea scale;Able to understand and use rate of perceived exertion (RPE) scale;Knowledge and understanding of Target Heart Rate Range (THRR);Able to check pulse independently     Comments Rozalyn missed both sessions this week.  Staff will monitor progress. Kamryn is doing well in rehab. She is already getting close to graduation.  We expect to see an increase in her post 6MWT.  She is still walking the whole time on the TM.  We will continue to monitor her progress. Jani is doing great in rehab, she completed her post 6MWT and improved by 135 feet. She walks at home inside for about 15 minutes at a time, she plans to continue to do that at home and improve her time overall. She also plans to use Youtube videos to complete seated chair exercises while she watches TV.     Expected Outcomes Short: attend consistently Long: improve overall stamina Short: Improve post 6MWT  Long: Continue to improve stamina. Short: Become more familiar with chair exercises, continue to walk at home Long: Continue to improve SOB and increase strength/stamina with exercises. Maintain exercise independetly at home monitoring HR and O2            Nutrition & Weight - Outcomes:  Pre Biometrics - 09/22/19 1541      Pre Biometrics   Height 5' 6.5" (1.689 m)    Weight 250 lb 12.8 oz (113.8 kg)    BMI (Calculated) 39.88    Single Leg Stand 1.53 seconds            Nutrition:  Nutrition Therapy & Goals - 09/26/19 1503      Nutrition Therapy   Diet HH diet    Drug/Food Interactions Statins/Certain Fruits;Coumadin/Vit K     Protein (specify units) 125.18g    Fiber 25 grams    Whole Grain Foods 3 servings    Saturated Fats 12 max. grams    Fruits and Vegetables 5 servings/day    Sodium 1.5 grams      Personal Nutrition Goals   Nutrition  Goal ST: include more vegetables in an easier manner LT: able to get around better, would like to increase balance, MD said to lose weight.    Comments Pt reports able to do ADL. Toast with peanut butter (wheat, unless its expensive) with 2 cups of coffee (black)and  Juice glass of cranberry juice (mostly water). Pimento cheese cracker or banana and peanut butter, cereal, yogurt.  Meatloaf and baked potato. Pt reports not eating many vegetables. Snacks on fruit. Pt on long lasting insulin; checks BG every morning - takes insulin at night. Discussed HH eating, set-point weight, pulmonary MNT. Lipitor (statin),Colace,Lasix,T2DM: januvia, Insulin Glargine (long lasting),Coumadin,Vitamin D, CKD stg 4, CHF, Pulmonary hypertension      Intervention Plan   Intervention Nutrition handout(s) given to patient.    Expected Outcomes Short Term Goal: Understand basic principles of dietary content, such as calories, fat, sodium, cholesterol and nutrients.;Short Term Goal: A plan has been developed with personal nutrition goals set during dietitian appointment.;Long Term Goal: Adherence to prescribed nutrition plan.           Nutrition Discharge:  Nutrition Assessments - 09/22/19 1542      MEDFICTS Scores   Pre Score 56           Education Questionnaire Score:  Knowledge Questionnaire Score - 09/22/19 1542      Knowledge Questionnaire Score   Pre Score 18/18           Goals reviewed with patient; copy given to patient.

## 2020-02-21 NOTE — Patient Instructions (Signed)
Description   Current Dose: 60m on T,TH,Sat except 4 mg the other days. Changes: No changes  Recheck in 1 weeks

## 2020-02-21 NOTE — Progress Notes (Signed)
Daily Session Note  Patient Details  Name: Ann Flynn MRN: [id number] Date of Birth: 1940/10/19 Referring Provider:     Pulmonary Rehab from 09/22/2019 in [facility]  Referring Provider Glori Bickers MD      Encounter Date: 02/21/2020  Check In:  Session Check In - 02/21/20 1202      Check-In   Supervising physician immediately available to respond to emergencies See telemetry face sheet for immediately available ER MD    Location [facility]-Cardiac & Pulmonary Rehab    Staff Present Heath Lark, RN, BSN, CCRP;Jessica Hobart, MA, RCEP, CCRP, [place], [place], ACSM CEP, Exercise Physiologist;Kara Eliezer Bottom, MS Exercise Physiologist    Virtual Visit No    Fall or balance concerns reported    No    Warm-up and Cool-down Performed on first and last piece of equipment    Resistance Training Performed Yes    VAD Patient? No    PAD/SET Patient? No      Pain Assessment   Currently in Pain? No/denies              Social History   Tobacco Use  Smoking Status Never Smoker  Smokeless Tobacco Never Used    Goals Met:  Proper associated with RPD/PD & O2 Sat Independence with exercise equipment Exercise tolerated well No report of cardiac concerns or symptoms  Goals Unmet:  Not Applicable  Comments: Pt able to follow exercise prescription today without complaint.  Will continue to monitor for progression.    Dr. Emily Filbert is Medical Director for [place] and [facility].

## 2020-02-23 ENCOUNTER — Other Ambulatory Visit: Payer: Self-pay

## 2020-02-23 ENCOUNTER — Encounter: Payer: Medicare Other | Admitting: *Deleted

## 2020-02-23 DIAGNOSIS — I272 Pulmonary hypertension, unspecified: Secondary | ICD-10-CM

## 2020-02-23 DIAGNOSIS — I270 Primary pulmonary hypertension: Secondary | ICD-10-CM | POA: Diagnosis not present

## 2020-02-23 NOTE — Progress Notes (Signed)
Pulmonary Individual Treatment Plan  Patient Details  Name: Ann Flynn MRN: [id number] Date of Birth: 06/28/1941 Referring Provider:     Pulmonary Rehab from 09/22/2019 in [facility] Cardiac and Pulmonary Rehab  Referring Provider Glori Bickers MD      Initial Encounter Date:    Pulmonary Rehab from 09/22/2019 in [facility] Cardiac and Pulmonary Rehab  Date 09/22/19      Visit Diagnosis: Pulmonary hypertension ([place])  Patient's Home Medications on Admission:  Current Outpatient Medications:  .  acetaminophen (TYLENOL) 500 MG tablet, Take 500 mg by mouth 2 (two) times daily. , Disp: , Rfl:  .  ambrisentan (LETAIRIS) 5 MG tablet, Take 1 tablet (5 mg total) by mouth daily. (Patient taking differently: Take 5 mg by mouth at bedtime. ), Disp: 90 tablet, Rfl: 1 .  amLODipine (NORVASC) 5 MG tablet, TAKE ONE TABLET EVERY DAY (Patient taking differently: at bedtime. ), Disp: 30 tablet, Rfl: 5 .  atorvastatin (LIPITOR) 80 MG tablet, TAKE 1 TABLET BY MOUTH DAILY, Disp: 90 tablet, Rfl: 2 .  BD PEN NEEDLE NANO U/F 32G X 4 MM MISC, USE TO INJECT ONCE DAILY AS DIRECTED, Disp: 100 each, Rfl: 2 .  Cyanocobalamin (B-12 PO), Take 1 tablet by mouth daily., Disp: , Rfl:  .  docusate sodium (COLACE) 100 MG capsule, Take 100 mg by mouth at bedtime. (Patient not taking: Reported on 02/17/2020), Disp: , Rfl:  .  Dulaglutide (TRULICITY) 3.41 DQ/2.2WL SOPN, Inject 0.75 mg into the skin once a week. (Patient not taking: Reported on 02/17/2020), Disp: 4 pen, Rfl: 0 .  empagliflozin (JARDIANCE) 10 MG TABS tablet, Take 1 tablet (10 mg total) by mouth daily before breakfast., Disp: 30 tablet, Rfl: 1 .  ferrous sulfate 325 (65 FE) MG EC tablet, Take 1 tablet (325 mg total) by mouth 3 (three) times daily with meals., Disp: 90 tablet, Rfl: 0 .  losartan (COZAAR) 100 MG tablet, TAKE ONE TABLET EVERY DAY (Patient taking differently: at bedtime. ), Disp: 90 tablet, Rfl: 0 .  metoprolol tartrate (LOPRESSOR) 25 MG tablet, TAKE 1 TABLET  BY MOUTH TWICE DAILY, Disp: 180 tablet, Rfl: 1 .  omeprazole (PRILOSEC) 40 MG capsule, TAKE 1 CAPSULE BY MOUTH ONCE DAILY, Disp: 90 capsule, Rfl: 1 .  ONETOUCH ULTRA test strip, USE AS DIRECTED, Disp: 100 each, Rfl: 1 .  tadalafil, PAH, (ADCIRCA) 20 MG tablet, Take 2 tablets (40 mg total) by mouth daily. (Patient taking differently: Take 40 mg by mouth at bedtime. ), Disp: 180 tablet, Rfl: 1 .  torsemide (DEMADEX) 20 MG tablet, Take 2 tablets (40 mg total) by mouth daily. (Patient taking differently: Take 20 mg by mouth 2 (two) times daily. ), Disp: 180 tablet, Rfl: 0 .  TOUJEO SOLOSTAR 300 UNIT/ML Solostar Pen, INJECT 50 UNITS INTO THE SKIN DAILY (Patient taking differently: at bedtime. ), Disp: 4.5 mL, Rfl: 2 .  venlafaxine (EFFEXOR) 75 MG tablet, TAKE ONE TABLET EVERY DAY (Patient taking differently: every other day. ), Disp: 90 tablet, Rfl: 0 .  VITAMIN D PO, Take 2,000 Units by mouth daily. , Disp: , Rfl:  .  warfarin (COUMADIN) 3 MG tablet, TAKE 1 TABLET BY MOUTH ON MONDAY WEDNESDAY AND FRIDAY ALONG WITH 4MG ON THE REMAINING DAYS (Patient taking differently: TAKE 3 TABLET BY MOUTH ON Tuesday, Thursday, Saturday 4MG ON THE REMAINING DAYS), Disp: 30 tablet, Rfl: 0  Past Medical History: No past medical history on file.  Tobacco Use: Social History   Tobacco Use  Smoking Status  Never Smoker  Smokeless Tobacco Never Used    Labs: Recent Review Flowsheet Data    Labs for ITP Cardiac and Pulmonary Rehab Latest Ref Rng & Units 07/06/2019 07/29/2019 07/29/2019 07/29/2019 01/18/2020   Cholestrol 100 - 199 mg/dL - - - - 104   LDLCALC 0 - 99 mg/dL - - - - 46   HDL >39 mg/dL - - - - 43   Trlycerides 0 - 149 mg/dL - - - - 68   Hemoglobin A1c 4.0 - 5.6 % 7.3(A) - - - 7.3(A)   HCO3 20.0 - 28.0 mmol/L - 26.3 26.7 26.9 -   TCO2 22 - 32 mmol/L - _0 -   O2SAT % - 75.0 74.0 72.0 -       Pulmonary Assessment Scores:  Pulmonary Assessment Scores    Row Name 09/22/19 1543         ADL  UCSD   ADL Phase Entry     SOB Score total 42     Rest 0     Walk 2     Stairs 5     Bath 1     Dress 1     Shop 2       CAT Score   CAT Score 22       mMRC Score   mMRC Score 2            UCSD: Self-administered rating of dyspnea associated with activities of daily living (ADLs) 6-point scale (0 = "not at all" to 5 = "maximal or unable to do because of breathlessness")  Scoring Scores range from 0 to 120.  Minimally important difference is 5 units  CAT: CAT can identify the health impairment of COPD patients and is better correlated with disease progression.  CAT has a scoring range of zero to 40. The CAT score is classified into four groups of low (less than 10), medium (10 - 20), high (21-30) and very high (31-40) based on the impact level of disease on health status. A CAT score over 10 suggests significant symptoms.  A worsening CAT score could be explained by an exacerbation, poor medication adherence, poor inhaler technique, or progression of COPD or comorbid conditions.  CAT MCID is 2 points  mMRC: mMRC (Modified Medical Research Council) Dyspnea Scale is used to assess the degree of baseline functional disability in patients of respiratory disease due to dyspnea. No minimal important difference is established. A decrease in score of 1 point or greater is considered a positive change.   Pulmonary Function Assessment:  Pulmonary Function Assessment - 09/22/19 1543      Breath   Shortness of Breath Yes;Fear of Shortness of Breath;Limiting activity           Exercise Target Goals: Exercise Program Goal: Individual exercise prescription set using results from initial 6 min walk test and THRR while considering  patient's activity barriers and safety.   Exercise Prescription Goal: Initial exercise prescription builds to 30-45 minutes a day of aerobic activity, 2-3 days per week.  Home exercise guidelines will be given to patient during program as part of exercise  prescription that the participant will acknowledge.  Education: Aerobic Exercise & Resistance Training: - Gives group verbal and written instruction on the various components of exercise. Focuses on aerobic and resistive training programs and the benefits of this training and how to safely progress through these programs..   Education: Exercise & Equipment Safety: - Individual verbal instruction and demonstration of  equipment use and safety with use of the equipment.   Pulmonary Rehab from 09/22/2019 in [facility] Cardiac and Pulmonary Rehab  Date 09/22/19  Educator [facility]  Instruction Review Code 1- Verbalizes Understanding      Education: Exercise Physiology & General Exercise Guidelines: - Group verbal and written instruction with models to review the exercise physiology of the cardiovascular system and associated critical values. Provides general exercise guidelines with specific guidelines to those with heart or lung disease.    Education: Flexibility, Balance, Mind/Body Relaxation: Provides group verbal/written instruction on the benefits of flexibility and balance training, including mind/body exercise modes such as yoga, pilates and tai chi.  Demonstration and skill practice provided.   Activity Barriers & Risk Stratification:  Activity Barriers & Cardiac Risk Stratification - 09/21/19 0808      Activity Barriers & Cardiac Risk Stratification   Activity Barriers Arthritis;Joint Problems;Other (comment);Deconditioning;Muscular Weakness;Shortness of Breath;Balance Concerns    Comments Right leg/hip flaring up occassionally (possible sciattica)           6 Minute Walk:  6 Minute Walk    Row Name 09/22/19 1534 02/09/20 1143       6 Minute Walk   Phase Initial --    Distance 400 feet 535 feet    Distance % Change -- 33 %    Distance Feet Change -- 135 ft    Walk Time 5 minutes 5.3 minutes    # of Rest Breaks 0  stopped at 5 min 2    MPH 0.91 1.15    METS 0.56 0.45    RPE 17 14     Perceived Dyspnea  3 3    VO2 Peak 1.95 1.6    Symptoms Yes (comment) No    Comments dizzy, hips hurting 8/10 --    Resting HR 62 bpm 59 bpm    Resting BP 134/72 126/62    Resting Oxygen Saturation  99 % 100 %    Exercise Oxygen Saturation  during 6 min walk 86 % 88 %    Max Ex. HR 120 bpm 105 bpm    Max Ex. BP 152/84 120/60    2 Minute Post BP 134/74 --      Interval HR   1 Minute HR 95 82    2 Minute HR 113 93    3 Minute HR 117 91    4 Minute HR 119 105    5 Minute HR 120 105    6 Minute HR 111 94    2 Minute Post HR 87 --    Interval Heart Rate? Yes Yes      Interval Oxygen   Interval Oxygen? Yes Yes    Baseline Oxygen Saturation % 99 % 100 %    1 Minute Oxygen Saturation % 95 % 94 %    1 Minute Liters of Oxygen 3 L 4 L  pulsed    2 Minute Oxygen Saturation % 94 % 94 %    2 Minute Liters of Oxygen 3 L 4 L    3 Minute Oxygen Saturation % 91 % 90 %    3 Minute Liters of Oxygen 3 L 4 L    4 Minute Oxygen Saturation % 85 % 94 %    4 Minute Liters of Oxygen 3 L 4 L    5 Minute Oxygen Saturation % 86 % 93 %    5 Minute Liters of Oxygen 3 L 4 L    6 Minute Oxygen Saturation %  90 % 88 %    6 Minute Liters of Oxygen 3 L 4 L    2 Minute Post Oxygen Saturation % 98 % --    2 Minute Post Liters of Oxygen 3 L 4 L          Oxygen Initial Assessment:  Oxygen Initial Assessment - 09/21/19 0811      Home Oxygen   Home Oxygen Device Home Concentrator;E-Tanks;Portable Concentrator   just approved for portable concentrator   Sleep Oxygen Prescription CPAP;Continuous    Liters per minute 2   11 setting   Home Exercise Oxygen Prescription Continuous   new concentrator will be pulsed   Liters per minute 3    Home at Rest Exercise Oxygen Prescription Continuous    Liters per minute 2.5    Compliance with Home Oxygen Use Yes      Initial 6 min Walk   Oxygen Used Continuous;E-Tanks   may want to test new concentrator once it arrives   Liters per minute 3      Program Oxygen  Prescription   Program Oxygen Prescription Continuous;E-Tanks    Liters per minute 3      Intervention   Short Term Goals To learn and exhibit compliance with exercise, home and travel O2 prescription;To learn and understand importance of maintaining oxygen saturations>88%;To learn and understand importance of monitoring SPO2 with pulse oximeter and demonstrate accurate use of the pulse oximeter.;To learn and demonstrate proper pursed lip breathing techniques or other breathing techniques.;To learn and demonstrate proper use of respiratory medications   has pulse oximeter   Long  Term Goals Exhibits compliance with exercise, home and travel O2 prescription;Verbalizes importance of monitoring SPO2 with pulse oximeter and return demonstration;Maintenance of O2 saturations>88%;Exhibits proper breathing techniques, such as pursed lip breathing or other method taught during program session;Compliance with respiratory medication;Demonstrates proper use of MDI's           Oxygen Re-Evaluation:  Oxygen Re-Evaluation    Row Name 09/27/19 1123 11/15/19 1122 02/14/20 1203         Program Oxygen Prescription   Program Oxygen Prescription Continuous;E-Tanks Continuous;E-Tanks Continuous     Liters per minute 3 3 3.5       Home Oxygen   Home Oxygen Device Home Concentrator;E-Tanks;Portable Concentrator Home Concentrator;E-Tanks;Portable Concentrator Home Concentrator;E-Tanks;Portable Concentrator     Sleep Oxygen Prescription CPAP;Continuous CPAP;Continuous CPAP;Continuous     Liters per minute 2 2 --     Home Exercise Oxygen Prescription -- Continuous Continuous     Liters per minute 3 3 --     Home at Rest Exercise Oxygen Prescription Continuous Continuous Continuous     Liters per minute 2.5 2.5 --     Compliance with Home Oxygen Use Yes Yes Yes       Goals/Expected Outcomes   Short Term Goals To learn and exhibit compliance with exercise, home and travel O2 prescription;To learn and  understand importance of maintaining oxygen saturations>88%;To learn and understand importance of monitoring SPO2 with pulse oximeter and demonstrate accurate use of the pulse oximeter.;To learn and demonstrate proper pursed lip breathing techniques or other breathing techniques.;To learn and demonstrate proper use of respiratory medications To learn and exhibit compliance with exercise, home and travel O2 prescription;To learn and understand importance of maintaining oxygen saturations>88%;To learn and understand importance of monitoring SPO2 with pulse oximeter and demonstrate accurate use of the pulse oximeter.;To learn and demonstrate proper pursed lip breathing techniques or other breathing techniques.;To learn and demonstrate  proper use of respiratory medications To learn and exhibit compliance with exercise, home and travel O2 prescription;To learn and understand importance of maintaining oxygen saturations>88%;To learn and understand importance of monitoring SPO2 with pulse oximeter and demonstrate accurate use of the pulse oximeter.;To learn and demonstrate proper pursed lip breathing techniques or other breathing techniques.;To learn and demonstrate proper use of respiratory medications     Long  Term Goals Exhibits compliance with exercise, home and travel O2 prescription;Verbalizes importance of monitoring SPO2 with pulse oximeter and return demonstration;Maintenance of O2 saturations>88%;Exhibits proper breathing techniques, such as pursed lip breathing or other method taught during program session;Compliance with respiratory medication;Demonstrates proper use of MDI's Exhibits compliance with exercise, home and travel O2 prescription;Verbalizes importance of monitoring SPO2 with pulse oximeter and return demonstration;Maintenance of O2 saturations>88%;Exhibits proper breathing techniques, such as pursed lip breathing or other method taught during program session;Compliance with respiratory  medication;Demonstrates proper use of MDI's Exhibits compliance with exercise, home and travel O2 prescription;Verbalizes importance of monitoring SPO2 with pulse oximeter and return demonstration;Maintenance of O2 saturations>88%;Exhibits proper breathing techniques, such as pursed lip breathing or other method taught during program session;Compliance with respiratory medication;Demonstrates proper use of MDI's     Comments Reviewed PLB technique with pt.  Talked about how it works and it's importance in maintaining their exercise saturations. Jalise has been compliant with her oxygen at home.  She will sit and use her PLB to help get better control of her breathing when she feels that it is getting away from her. Almee remains compliant with her oxygen. She monitors her O2 sats at home and is comfortable completing PLB exercises at home.     Goals/Expected Outcomes Short: Become more profiecient at using PLB.   Long: Become independent at using PLB. Short: Continue to use PLB to help with breathing.  Long: Continued compliance. h --            Oxygen Discharge (Final Oxygen Re-Evaluation):  Oxygen Re-Evaluation - 02/14/20 1203      Program Oxygen Prescription   Program Oxygen Prescription Continuous    Liters per minute 3.5      Home Oxygen   Home Oxygen Device Home Concentrator;E-Tanks;Portable Concentrator    Sleep Oxygen Prescription CPAP;Continuous    Home Exercise Oxygen Prescription Continuous    Home at Rest Exercise Oxygen Prescription Continuous    Compliance with Home Oxygen Use Yes      Goals/Expected Outcomes   Short Term Goals To learn and exhibit compliance with exercise, home and travel O2 prescription;To learn and understand importance of maintaining oxygen saturations>88%;To learn and understand importance of monitoring SPO2 with pulse oximeter and demonstrate accurate use of the pulse oximeter.;To learn and demonstrate proper pursed lip breathing techniques or other  breathing techniques.;To learn and demonstrate proper use of respiratory medications    Long  Term Goals Exhibits compliance with exercise, home and travel O2 prescription;Verbalizes importance of monitoring SPO2 with pulse oximeter and return demonstration;Maintenance of O2 saturations>88%;Exhibits proper breathing techniques, such as pursed lip breathing or other method taught during program session;Compliance with respiratory medication;Demonstrates proper use of MDI's    Comments Krissa remains compliant with her oxygen. She monitors her O2 sats at home and is comfortable completing PLB exercises at home.           Initial Exercise Prescription:  Initial Exercise Prescription - 09/22/19 1500      Date of Initial Exercise RX and Referring Provider   Date 09/22/19    Referring  Provider Bensimhon, Daniel MD      Oxygen   Oxygen Continuous    Liters 3      Treadmill   MPH 0.8    Grade 0    Minutes 15    METs 1.6      NuStep   Level 1    SPM 80    Minutes 15    METs 1.5      T5 Nustep   Level 1    SPM 80    Minutes 15    METs 1.5      Biostep-RELP   Level 1    SPM 50    Minutes 15    METs 1      Prescription Details   Frequency (times per week) 2    Duration Progress to 30 minutes of continuous aerobic without signs/symptoms of physical distress      Intensity   THRR 40-80% of Max Heartrate 94-126    Ratings of Perceived Exertion 11-13    Perceived Dyspnea 0-4      Progression   Progression Continue to progress workloads to maintain intensity without signs/symptoms of physical distress.      Resistance Training   Training Prescription Yes    Weight 3 lb    Reps 10-15           Perform Capillary Blood Glucose checks as needed.  Exercise Prescription Changes:  Exercise Prescription Changes    Row Name 09/22/19 1500 10/04/19 1100 10/19/19 0900 11/01/19 1100 11/14/19 1300     Response to Exercise   Blood Pressure (Admit) 134/72 158/80 114/66 118/82  144/74   Blood Pressure (Exercise) 152/84 156/84 122/74 130/64 124/64   Blood Pressure (Exit) 126/72 126/64 102/60 106/62 104/64   Heart Rate (Admit) 62 bpm 78 bpm 85 bpm 80 bpm 80 bpm   Heart Rate (Exercise) 120 bpm 84 bpm 98 bpm 96 bpm 105 bpm   Heart Rate (Exit) 64 bpm 67 bpm 62 bpm 61 bpm 84 bpm   Oxygen Saturation (Admit) 99 % 97 % 90 % 94 % 96 %   Oxygen Saturation (Exercise) 85 % 95 % 93 % 97 % 93 %   Oxygen Saturation (Exit) 97 % 95 % 98 % 98 % 94 %   Rating of Perceived Exertion (Exercise) _0 Perceived Dyspnea (Exercise) _1 Symptoms dizzy, hip hurting 8/10 -- fatigue -- none   Comments walk test results first day -- -- --   Duration -- -- Continue with 30 min of aerobic exercise without signs/symptoms of physical distress. Continue with 30 min of aerobic exercise without signs/symptoms of physical distress. Continue with 30 min of aerobic exercise without signs/symptoms of physical distress.   Intensity -- -- THRR unchanged THRR unchanged THRR unchanged     Progression   Progression -- -- Continue to progress workloads to maintain intensity without signs/symptoms of physical distress. Continue to progress workloads to maintain intensity without signs/symptoms of physical distress. Continue to progress workloads to maintain intensity without signs/symptoms of physical distress.   Average METs -- -- 1.5 1.5 1.8     Resistance Training   Training Prescription -- -- Yes Yes Yes   Weight -- -- 3 lb  3 lb  3 lb   Reps -- -- 10-15 10-15 10-15     Interval Training   Interval Training -- -- No No No     Treadmill   MPH -- --  0.8 -- 0.8   Grade -- -- 0 -- 0   Minutes -- -- 15 -- 15   METs -- -- 1.61 -- 1.6     NuStep   Level -- -- _0 SPM -- -- -- 80 --   Minutes -- -- _1 METs -- -- 1.6 2 1.8     T5 Nustep   Level -- -- 1 -- 1   Minutes -- -- 15 -- 15   METs -- -- 1.8 -- 1.8     Biostep-RELP   Level -- -- _2 SPM -- -- -- 50 --    Minutes -- -- _3 METs -- -- _4 Row Name 11/15/19 1100 11/29/19 1300 12/12/19 1500 12/20/19 1500 01/02/20 1600     Response to Exercise   Blood Pressure (Admit) -- 114/62 128/78 120/60 120/70   Blood Pressure (Exercise) -- 118/64 122/68 132/58 110/70   Blood Pressure (Exit) -- 130/82 118/64 128/70 112/66   Heart Rate (Admit) -- 85 bpm 104 bpm 60 bpm 69 bpm   Heart Rate (Exercise) -- 102 bpm 112 bpm 104 bpm 85 bpm   Heart Rate (Exit) -- 86 bpm 61 bpm 68 bpm 67 bpm   Oxygen Saturation (Admit) -- 90 % 90 % 91 % 93 %   Oxygen Saturation (Exercise) -- 92 % 92 % 91 % 95 %   Oxygen Saturation (Exit) -- 88 % 93 % 95 % 99 %   Rating of Perceived Exertion (Exercise) -- _5 Perceived Dyspnea (Exercise) -- _6 Symptoms -- none none none none   Duration -- Continue with 30 min of aerobic exercise without signs/symptoms of physical distress. Continue with 30 min of aerobic exercise without signs/symptoms of physical distress. Continue with 30 min of aerobic exercise without signs/symptoms of physical distress. Continue with 30 min of aerobic exercise without signs/symptoms of physical distress.   Intensity -- THRR unchanged THRR unchanged THRR unchanged THRR unchanged     Progression   Progression -- Continue to progress workloads to maintain intensity without signs/symptoms of physical distress. Continue to progress workloads to maintain intensity without signs/symptoms of physical distress. Continue to progress workloads to maintain intensity without signs/symptoms of physical distress. Continue to progress workloads to maintain intensity without signs/symptoms of physical distress.   Average METs -- 2.25 1.73 2 1.8     Resistance Training   Training Prescription -- Yes Yes Yes Yes   Weight -- 3 lb 3 lb 3 lb 3 lb   Reps -- 10-15 10-15 10-15 10-15     Interval Training   Interval Training -- No No No No     Treadmill   MPH -- -- 0.8 -- 0.8   Grade -- -- 0 -- 0    Minutes -- -- 15 -- 15   METs -- -- 1.6 -- 1.6     NuStep   Level -- -- _7 SPM -- -- -- 80 --   Minutes -- -- _8 METs -- -- 1.5 2.1 1.8     T5 Nustep   Level -- 4 1 -- 5   SPM -- 80 -- -- --   Minutes -- 15 15 -- 15   METs -- 2.5 1.8 -- 1.8     Biostep-RELP   Level -- 1  _0 SPM -- 50 -- 50 --   Minutes -- _1 METs -- _2 Home Exercise Plan   Plans to continue exercise at Home (comment)  walking, staff videos Home (comment)  walking, staff videos Home (comment)  walking, staff videos Home (comment)  walking, staff videos Home (comment)  walking, staff videos   Frequency Add 2 additional days to program exercise sessions. Add 2 additional days to program exercise sessions. Add 2 additional days to program exercise sessions. Add 2 additional days to program exercise sessions. Add 2 additional days to program exercise sessions.   Initial Home Exercises Provided 11/15/19 11/15/19 11/15/19 11/15/19 11/15/19   Row Name 01/19/20 1200 02/02/20 1400 02/14/20 1400         Response to Exercise   Blood Pressure (Admit) 114/68 116/60 118/64     Blood Pressure (Exercise) 118/72 98/58 126/62     Blood Pressure (Exit) 136/64 104/66 118/64     Heart Rate (Admit) 103 bpm 86 bpm 67 bpm     Heart Rate (Exercise) 110 bpm 89 bpm 79 bpm     Heart Rate (Exit) 70 bpm 73 bpm 68 bpm     Oxygen Saturation (Admit) 90 % 94 % 95 %     Oxygen Saturation (Exercise) 90 % 90 % 98 %     Oxygen Saturation (Exit) 95 % 96 % 98 %     Rating of Perceived Exertion (Exercise) _3 Perceived Dyspnea (Exercise) _4 Symptoms none none none     Duration Continue with 30 min of aerobic exercise without signs/symptoms of physical distress. Continue with 30 min of aerobic exercise without signs/symptoms of physical distress. Continue with 30 min of aerobic exercise without signs/symptoms of physical distress.     Intensity THRR unchanged THRR unchanged THRR unchanged        Progression   Progression Continue to progress workloads to maintain intensity without signs/symptoms of physical distress. Continue to progress workloads to maintain intensity without signs/symptoms of physical distress. Continue to progress workloads to maintain intensity without signs/symptoms of physical distress.     Average METs 2 1.75 1.98       Resistance Training   Training Prescription Yes Yes Yes     Weight 3 lb 3 lb 3 lb     Reps 10-15 10-15 10-15       Interval Training   Interval Training -- No No       Oxygen   Oxygen -- -- Continuous     Liters -- -- 3       Treadmill   MPH 0.8 0.8 0.8     Grade 0 0 0     Minutes _5 METs 1.6 1.6 1.6       NuStep   Level -- -- 5     Minutes -- -- 15     METs -- -- 2       T5 Nustep   Level _6 SPM 80 -- --     Minutes _7 METs -- 1.9 1.8       Home Exercise Plan   Plans to continue exercise at Home (comment)  walking, staff videos Home (comment)  walking, staff videos Home (comment)  walking, staff videos  Frequency Add 2 additional days to program exercise sessions. Add 2 additional days to program exercise sessions. Add 2 additional days to program exercise sessions.     Initial Home Exercises Provided 11/15/19 11/15/19 11/15/19            Exercise Comments:  Exercise Comments    Row Name 10/25/19 1240 01/31/20 1132 01/31/20 1138       Exercise Comments Pulse oximeter not picking up HR. Placed on monitor to determine HR and rhythm. Found to be in A Fib. Note sent to referring physician. Patient is not aware of ever being in A fib. She is on Warfarin daily. Ashonti is wearing a heart monitor 6/11 to 6/25 Evalyn is wearing a heart monitor 6/11 to 6/25  Doctor "saw a blip on my EKG"  Monitor to see if more.            Exercise Goals and Review:  Exercise Goals    Row Name 09/22/19 1540             Exercise Goals   Increase Physical Activity Yes       Intervention Provide advice,  education, support and counseling about physical activity/exercise needs.;Develop an individualized exercise prescription for aerobic and resistive training based on initial evaluation findings, risk stratification, comorbidities and participant's personal goals.       Expected Outcomes Short Term: Attend rehab on a regular basis to increase amount of physical activity.;Long Term: Add in home exercise to make exercise part of routine and to increase amount of physical activity.;Long Term: Exercising regularly at least 3-5 days a week.       Increase Strength and Stamina Yes       Intervention Provide advice, education, support and counseling about physical activity/exercise needs.;Develop an individualized exercise prescription for aerobic and resistive training based on initial evaluation findings, risk stratification, comorbidities and participant's personal goals.       Expected Outcomes Short Term: Increase workloads from initial exercise prescription for resistance, speed, and METs.;Short Term: Perform resistance training exercises routinely during rehab and add in resistance training at home;Long Term: Improve cardiorespiratory fitness, muscular endurance and strength as measured by increased METs and functional capacity (6MWT)       Able to understand and use rate of perceived exertion (RPE) scale Yes       Intervention Provide education and explanation on how to use RPE scale       Expected Outcomes Short Term: Able to use RPE daily in rehab to express subjective intensity level;Long Term:  Able to use RPE to guide intensity level when exercising independently       Able to understand and use Dyspnea scale Yes       Intervention Provide education and explanation on how to use Dyspnea scale       Expected Outcomes Short Term: Able to use Dyspnea scale daily in rehab to express subjective sense of shortness of breath during exertion;Long Term: Able to use Dyspnea scale to guide intensity level when  exercising independently       Knowledge and understanding of Target Heart Rate Range (THRR) Yes       Intervention Provide education and explanation of THRR including how the numbers were predicted and where they are located for reference       Expected Outcomes Short Term: Able to state/look up THRR;Short Term: Able to use daily as guideline for intensity in rehab;Long Term: Able to use THRR to govern intensity when exercising independently  Able to check pulse independently Yes       Intervention Provide education and demonstration on how to check pulse in carotid and radial arteries.;Review the importance of being able to check your own pulse for safety during independent exercise       Expected Outcomes Short Term: Able to explain why pulse checking is important during independent exercise;Long Term: Able to check pulse independently and accurately       Understanding of Exercise Prescription Yes       Intervention Provide education, explanation, and written materials on patient's individual exercise prescription       Expected Outcomes Short Term: Able to explain program exercise prescription;Long Term: Able to explain home exercise prescription to exercise independently              Exercise Goals Re-Evaluation :  Exercise Goals Re-Evaluation    Row Name 09/27/19 1122 10/19/19 0942 11/01/19 1132 11/14/19 1339 11/15/19 1117     Exercise Goal Re-Evaluation   Exercise Goals Review Increase Physical Activity;Able to understand and use rate of perceived exertion (RPE) scale;Knowledge and understanding of Target Heart Rate Range (THRR);Understanding of Exercise Prescription;Increase Strength and Stamina;Able to understand and use Dyspnea scale;Able to check pulse independently Increase Physical Activity;Increase Strength and Stamina;Understanding of Exercise Prescription Increase Physical Activity;Increase Strength and Stamina;Able to understand and use rate of perceived exertion (RPE)  scale;Able to understand and use Dyspnea scale;Knowledge and understanding of Target Heart Rate Range (THRR);Able to check pulse independently;Understanding of Exercise Prescription Increase Physical Activity;Increase Strength and Stamina;Understanding of Exercise Prescription Increase Physical Activity;Increase Strength and Stamina;Understanding of Exercise Prescription   Comments Reviewed RPE scale, THR and program prescription with pt today.  Pt voiced understanding and was given a copy of goals to take home. Elaynah is off to a good start in rehab. She is already up to the full 15 min on the treadmill. We will continue to monitor her progress. Regena has increased from .6-.8 on TM.  Staff will monitor progress. Mariene is doing well in rehab.  She is now up to 2 METs on the BioStep.  We will encourage her to increase workloads and continue to monitor her progress. Tommie is doing well in rehab.  She over did it some trying to get ready for company, but she was able.  She is doing some exericse at home.  Reviewed home exercise with pt today.  Pt plans to walk and use videos at home for exercise.  Reviewed THR, pulse, RPE, sign and symptoms, and when to call 911 or MD.  Also discussed weather considerations and indoor options.  Pt voiced understanding.   Expected Outcomes Short: Use RPE daily to regulate intensity. Long: Follow program prescription in THR. Short: Continue to attend rehab regularly.  Long: Continue to follow program prescription. Short:  attend class consistently Long : improve overall stamina Short: Increase workloads.  Long: Continue to improve stamina. Short: Start to add in more exercise at home.  Long: Continue to improve stamina.   Edith Endave Name 11/29/19 1332 12/12/19 1539 12/20/19 1554 12/27/19 1132 01/02/20 1616     Exercise Goal Re-Evaluation   Exercise Goals Review Increase Physical Activity;Increase Strength and Stamina;Able to understand and use rate of perceived exertion (RPE) scale;Able to  understand and use Dyspnea scale;Knowledge and understanding of Target Heart Rate Range (THRR);Able to check pulse independently;Understanding of Exercise Prescription Increase Physical Activity;Increase Strength and Stamina;Understanding of Exercise Prescription Increase Physical Activity;Increase Strength and Stamina;Able to understand and use rate of perceived  exertion (RPE) scale;Able to understand and use Dyspnea scale;Knowledge and understanding of Target Heart Rate Range (THRR);Able to check pulse independently;Understanding of Exercise Prescription Increase Physical Activity;Increase Strength and Stamina;Able to understand and use rate of perceived exertion (RPE) scale;Able to understand and use Dyspnea scale;Knowledge and understanding of Target Heart Rate Range (THRR);Able to check pulse independently;Understanding of Exercise Prescription Increase Physical Activity;Increase Strength and Stamina;Understanding of Exercise Prescription   Comments Marinda has attended consistently and is up to level 4 on NS.  She works in correct THR and RPE range. Adriona is doing well in rehab.  She is doing her whole 15-20 min on the treadmill now.  We will move up her workload on the T5 NuStep.  We will continue to monitor her progress. Shanie missed some sessioins this month.  She will see more progress with more consistent attendance. Shenetta walks at home inside her house.  She plans to try staff videos. Jen has been doing well in rehab.  She is now up to level 5 on the NuStep.  We will continue to monitor her progress.   Expected Outcomes Short:  increase level on Biostep Long:  improve overall stamina Short: Increase workloads  Long: Continue to improve stamina. Short - attend/exercsie at home 3 days per week Long:  improve stamina/MET level Short: add in videos for exercise at least one other day Long: improve stamina Short: Increase SPM on both NuStep and BioStep for higher METs  Long: Continue to improve stamina.    [place] Name 01/19/20 1219 02/02/20 1429 02/14/20 1136         Exercise Goal Re-Evaluation   Exercise Goals Review Increase Physical Activity;Increase Strength and Stamina;Understanding of Exercise Prescription Increase Physical Activity;Increase Strength and Stamina;Understanding of Exercise Prescription Increase Physical Activity;Increase Strength and Stamina;Understanding of Exercise Prescription;Able to understand and use Dyspnea scale;Able to understand and use rate of perceived exertion (RPE) scale;Knowledge and understanding of Target Heart Rate Range (THRR);Able to check pulse independently     Comments Sawsan missed both sessions this week.  Staff will monitor progress. Laron is doing well in rehab. She is already getting close to graduation.  We expect to see an increase in her post 6MWT.  She is still walking the whole time on the TM.  We will continue to monitor her progress. Jerrye is doing great in rehab, she completed her post 6MWT and improved by 135 feet. She walks at home inside for about 15 minutes at a time, she plans to continue to do that at home and improve her time overall. She also plans to use Youtube videos to complete seated chair exercises while she watches TV.     Expected Outcomes Short: attend consistently Long: improve overall stamina Short: Improve post 6MWT  Long: Continue to improve stamina. Short: Become more familiar with chair exercises, continue to walk at home Long: Continue to improve SOB and increase strength/stamina with exercises. Maintain exercise independetly at home monitoring HR and O2            Discharge Exercise Prescription (Final Exercise Prescription Changes):  Exercise Prescription Changes - 02/14/20 1400      Response to Exercise   Blood Pressure (Admit) 118/64    Blood Pressure (Exercise) 126/62    Blood Pressure (Exit) 118/64    Heart Rate (Admit) 67 bpm    Heart Rate (Exercise) 79 bpm    Heart Rate (Exit) 68 bpm    Oxygen Saturation (Admit)  95 %    Oxygen Saturation (  Exercise) 98 %    Oxygen Saturation (Exit) 98 %    Rating of Perceived Exertion (Exercise) 11    Perceived Dyspnea (Exercise) 1    Symptoms none    Duration Continue with 30 min of aerobic exercise without signs/symptoms of physical distress.    Intensity THRR unchanged      Progression   Progression Continue to progress workloads to maintain intensity without signs/symptoms of physical distress.    Average METs 1.98      Resistance Training   Training Prescription Yes    Weight 3 lb    Reps 10-15      Interval Training   Interval Training No      Oxygen   Oxygen Continuous    Liters 3      Treadmill   MPH 0.8    Grade 0    Minutes 15    METs 1.6      NuStep   Level 5    Minutes 15    METs 2      T5 Nustep   Level 1    Minutes 15    METs 1.8      Home Exercise Plan   Plans to continue exercise at Home (comment)   walking, staff videos   Frequency Add 2 additional days to program exercise sessions.    Initial Home Exercises Provided 11/15/19           Nutrition:  Target Goals: Understanding of nutrition guidelines, daily intake of sodium <1514m, cholesterol <2045m calories 30% from fat and 7% or less from saturated fats, daily to have 5 or more servings of fruits and vegetables.  Education: Controlling Sodium/Reading Food Labels -Group verbal and written material supporting the discussion of sodium use in heart healthy nutrition. Review and explanation with models, verbal and written materials for utilization of the food label.   Education: General Nutrition Guidelines/Fats and Fiber: -Group instruction provided by verbal, written material, models and posters to present the general guidelines for heart healthy nutrition. Gives an explanation and review of dietary fats and fiber.   Biometrics:  Pre Biometrics - 09/22/19 1541      Pre Biometrics   Height 5' 6.5" (1.689 m)    Weight 250 lb 12.8 oz (113.8 kg)    BMI  (Calculated) 39.88    Single Leg Stand 1.53 seconds            Nutrition Therapy Plan and Nutrition Goals:  Nutrition Therapy & Goals - 09/26/19 1503      Nutrition Therapy   Diet HH diet    Drug/Food Interactions Statins/Certain Fruits;Coumadin/Vit K    Protein (specify units) 125.18g    Fiber 25 grams    Whole Grain Foods 3 servings    Saturated Fats 12 max. grams    Fruits and Vegetables 5 servings/day    Sodium 1.5 grams      Personal Nutrition Goals   Nutrition Goal ST: include more vegetables in an easier manner LT: able to get around better, would like to increase balance, MD said to lose weight.    Comments Pt reports able to do ADL. Toast with peanut butter (wheat, unless its expensive) with 2 cups of coffee (black)and  Juice glass of cranberry juice (mostly water). Pimento cheese cracker or banana and peanut butter, cereal, yogurt.  Meatloaf and baked potato. Pt reports not eating many vegetables. Snacks on fruit. Pt on long lasting insulin; checks BG every morning - takes insulin at night.  Discussed HH eating, set-point weight, pulmonary MNT. Lipitor (statin),Colace,Lasix,T2DM: januvia, Insulin Glargine (long lasting),Coumadin,Vitamin D, CKD stg 4, CHF, Pulmonary hypertension      Intervention Plan   Intervention Nutrition handout(s) given to patient.    Expected Outcomes Short Term Goal: Understand basic principles of dietary content, such as calories, fat, sodium, cholesterol and nutrients.;Short Term Goal: A plan has been developed with personal nutrition goals set during dietitian appointment.;Long Term Goal: Adherence to prescribed nutrition plan.           Nutrition Assessments:  Nutrition Assessments - 09/22/19 1542      MEDFICTS Scores   Pre Score 56           MEDIFICTS Score Key:          ?70 Need to make dietary changes          40-70 Heart Healthy Diet         ? 40 Therapeutic Level Cholesterol Diet  Nutrition Goals Re-Evaluation:  Nutrition  Goals Re-Evaluation    [place] Name 10/18/19 1126 11/24/19 1130 12/27/19 1139 01/31/20 1141 02/14/20 1226     Goals   Nutrition Goal ST: include more vegetables in an easier manner LT: able to get around better, would like to increase balance, MD said to lose weight. ST: include more vegetables in an easier manner LT: able to get around better, would like to increase balance, MD said to lose weight. ST: include more vegetables in an easier manner LT: able to get around better, would like to increase balance, MD said to lose weight. -- ST: include more vegetables in an easier manner LT: able to get around better, would like to increase balance, MD said to lose weight.   Comment Pt reports including more fruits and vegetables and is doing well incorporating them into her meals. discussed consistent eating and BG. Pt reports being able to include more F/V to diet, carrots, cucumbers, tomatoes, bananas, and cuties. Gave pt some ideas like tzasiki dip and gave handouts. Pt reports getting an airfyer and has started using it. Shanterria has been using her air fryer and eating more fruits and vegetables.  Her NP referred her to [facility]. Raissa continues to use the air fryer and states she has been incorporating more vegetabiles with each meal. She plans to reach out to [facility] to set up an appointment.   Expected Outcome ST: include more vegetables in an easier manner LT: able to get around better, would like to increase balance, MD said to lose weight. ST: include more vegetables in an easier manner LT: able to get around better, would like to increase balance, MD said to lose weight. ST: include more vegetables in an easier manner LT: able to get around better, would like to increase balance, MD said to lose weight. Short : continue with healthy habits Long: acheive weight loss Continue to follow RD's guidelines with diet, maintain healthy diet contributing to weight loss.           Nutrition Goals Discharge (Final Nutrition Goals Re-Evaluation):  Nutrition Goals Re-Evaluation - 02/14/20 1226      Goals   Nutrition Goal ST: include more vegetables in an easier manner LT: able to get around better, would like to increase balance, MD said to lose weight.    Comment Aireanna continues to use the air fryer and states she has been incorporating more vegetabiles with each meal. She plans to reach out to [facility] to set up an appointment.    Expected Outcome Continue to follow RD's guidelines with diet, maintain healthy diet contributing to weight loss.           Psychosocial: Target Goals: Acknowledge presence or absence of significant depression and/or stress, maximize coping skills, provide positive support system. Participant is able to verbalize types and ability to use techniques and skills needed for reducing stress and depression.   Education: Depression - Provides group verbal and written instruction on the correlation between heart/lung disease and depressed mood, treatment options, and the stigmas associated with seeking treatment.   Education: Sleep Hygiene -Provides group verbal and written instruction about how sleep can affect your health.  Define sleep hygiene, discuss sleep cycles and impact of sleep habits. Review good sleep hygiene tips.    Education: Stress and Anxiety: - Provides group verbal and written instruction about the health risks of elevated stress and causes of high stress.  Discuss the correlation between heart/lung disease and anxiety and treatment options. Review healthy ways to manage with stress and anxiety.   Initial Review & Psychosocial Screening:  Initial Psych Review & Screening - 09/21/19 0817      Initial Review   Current issues with Current Depression;History of Depression;Current Psychotropic Meds;Current Stress Concerns    Source of Stress Concerns Chronic Illness;Family;Retirement/disability     Comments History of depression from loss of husband six years ago and started taking Elavil and she feels like her meds are working.  She recently moved down here about 7 months ago and the pandemic has made it hard to get acquainted.  She has two kids, daughter in [place] and son in [place].      Family Dynamics   Good Support System? Yes   Son and daughter are support system and live near by, hard to make new friends   Comments Lost husband six years ago and just moved here 7 months ago      Barriers   Psychosocial barriers to participate in program The patient should benefit from training in stress management and relaxation.;Psychosocial barriers identified (see note)      Screening Interventions   Interventions Encouraged to exercise;Program counselor consult;To provide support and resources with identified psychosocial needs;Provide feedback about the scores to participant    Expected Outcomes Short Term goal: Utilizing psychosocial counselor, staff and physician to assist with identification of specific Stressors or current issues interfering with healing process. Setting desired goal for each stressor or current issue identified.;Long Term Goal: Stressors or current issues are controlled or eliminated.;Short Term goal: Identification and review with participant of any Quality of Life or Depression concerns found by scoring the questionnaire.;Long Term goal: The participant improves quality of Life and PHQ9 Scores as seen by post scores and/or verbalization of changes           Quality of Life Scores:  Scores of 19 and below usually indicate a poorer quality of life in these areas.  A difference of  2-3 points is a clinically meaningful difference.  A difference of 2-3 points in the total score of the Quality of Life Index has been associated with significant improvement in overall quality of life, self-image, physical symptoms, and general health in studies assessing change in  quality of life.  PHQ-9: Recent Review Flowsheet Data    Depression screen [facility] 2/9 02/14/2020 10/11/2019 09/22/2019 03/28/2019   Decreased Interest _0 0   Down, Depressed, Hopeless 0 1 1 1  PHQ - 2 Score _0 Altered sleeping 0 1 2 -   Tired, decreased energy _1 -   Change in appetite _2 -   Feeling bad or failure about yourself  0 1 2 -   Trouble concentrating 0 0 0 -   Moving slowly or fidgety/restless 0 0 0 -   Suicidal thoughts 0 0 0 -   PHQ-9 Score _3 -   Difficult doing work/chores Not difficult at all Somewhat difficult Somewhat difficult -     Interpretation of Total Score  Total Score Depression Severity:  1-4 = Minimal depression, 5-9 = Mild depression, 10-14 = Moderate depression, 15-19 = Moderately severe depression, 20-27 = Severe depression   Psychosocial Evaluation and Intervention:  Psychosocial Evaluation - 02/14/20 1153      Psychosocial Evaluation & Interventions   Interventions Encouraged to exercise with the program and follow exercise prescription    Comments Lark has been holding up well with mental health. Keeps good connection with her kids, She enjoys living in the area and feels she is more acclimated with the environment. She recently joined a church and wants to increase her social life by going to a senior center. States her medication has been having her feel well. Her PHQ improved by a couple of points.      Discharge Psychosocial Assessment & Intervention   Comments Continue to exercise for mental health benefits and maintain a positive attitude. Maintain social life as well as a positive connection with her kids.           Psychosocial Re-Evaluation:  Psychosocial Re-Evaluation    [place] Name 10/27/19 1136 11/15/19 1118 12/27/19 1127 01/31/20 1143 02/14/20 1151     Psychosocial Re-Evaluation   Current issues with Current Stress Concerns;Current Sleep Concerns Current Stress Concerns Current Stress Concerns -- Current Stress  Concerns   Comments Meigan moved here from [place] 8 months ago.  She hasnt been able to meet many people yet.  She does have family here - daughter and son in law.  She states she sleeps pretty well - around 6 hours a night. Lanique is doing well in rehab. She is feeling good overall and mentally.  She has met a few more people coming to class, but hard to talk while exercising.  She enjoys being with people.  She continues to sleep well. Denisa is still feeling good overall and was able to visit family over the weekend.  She enjoys playing cards and will check on local Senior Centers that offer activities. Bellamia still feels well overall. Ary denies any big stressors in her life at this time.   Expected Outcomes Short: continue to attend [facility] for the exercise Long:  meet more people locally Short: Continue to try to get out to meet people  Long: Continue to stay positive. Short: attend LW for exercise and get out to meet others Long:  maintain positive outlook Short : attend LW for exercise Long:  maintain positive outlook --   Interventions Encouraged to attend [facility] for the exercise Encouraged to attend Pulmonary Rehabilitation for the exercise -- -- Encouraged to attend [facility] Rehabilitation for the exercise   Continue Psychosocial Services  -- Follow up required by staff -- -- Follow up required by staff     Initial Review   Source of Stress Concerns -- -- -- -- Chronic Illness;Family;Retirement/disability          Psychosocial Discharge (  Final Psychosocial Re-Evaluation):  Psychosocial Re-Evaluation - 02/14/20 1151      Psychosocial Re-Evaluation   Current issues with Current Stress Concerns    Comments Breea denies any big stressors in her life at this time.    Interventions Encouraged to attend Pulmonary Rehabilitation for the exercise    Continue Psychosocial Services  Follow up required by staff      Initial Review   Source of Stress Concerns Chronic  Illness;Family;Retirement/disability           Education: Education Goals: Education classes will be provided on a weekly basis, covering required topics. Participant will state understanding/return demonstration of topics presented.  Learning Barriers/Preferences:  Learning Barriers/Preferences - 09/21/19 1937      Learning Barriers/Preferences   Learning Barriers Sight   glasses   Learning Preferences None           General Pulmonary Education Topics:  Infection Prevention: - Provides verbal and written material to individual with discussion of infection control including proper hand washing and proper equipment cleaning during exercise session.   Pulmonary Rehab from 09/22/2019 in [facility] Cardiac and Pulmonary Rehab  Date 09/22/19  Educator [facility]  Instruction Review Code 1- Verbalizes Understanding      Falls Prevention: - Provides verbal and written material to individual with discussion of falls prevention and safety.   Pulmonary Rehab from 09/22/2019 in [facility] Cardiac and Pulmonary Rehab  Date 09/22/19  Educator [facility]  Instruction Review Code 1- Verbalizes Understanding      Chronic Lung Diseases: - Group verbal and written instruction to review updates, respiratory medications, advancements in procedures and treatments. Discuss use of supplemental oxygen including available portable oxygen systems, continuous and intermittent flow rates, concentrators, personal use and safety guidelines. Review proper use of inhaler and spacers. Provide informative websites for self-education.    Energy Conservation: - Provide group verbal and written instruction for methods to conserve energy, plan and organize activities. Instruct on pacing techniques, use of adaptive equipment and posture/positioning to relieve shortness of breath.   Triggers and Exacerbations: - Group verbal and written instruction to review types of environmental triggers and ways to prevent exacerbations. Discuss  weather changes, air quality and the benefits of nasal washing. Review warning signs and symptoms to help prevent infections. Discuss techniques for effective airway clearance, coughing, and vibrations.   AED/CPR: - Group verbal and written instruction with the use of models to demonstrate the basic use of the AED with the basic ABC's of resuscitation.   Anatomy and Physiology of the Lungs: - Group verbal and written instruction with the use of models to provide basic lung anatomy and physiology related to function, structure and complications of lung disease.   Anatomy & Physiology of the Heart: - Group verbal and written instruction and models provide basic cardiac anatomy and physiology, with the coronary electrical and arterial systems. Review of Valvular disease and Heart Failure   Cardiac Medications: - Group verbal and written instruction to review commonly prescribed medications for heart disease. Reviews the medication, class of the drug, and side effects.   Other: -Provides group and verbal instruction on various topics (see comments)   Knowledge Questionnaire Score:  Knowledge Questionnaire Score - 09/22/19 1542      Knowledge Questionnaire Score   Pre Score 18/18            Core Components/Risk Factors/Patient Goals at Admission:  Personal Goals and Risk Factors at Admission - 09/21/19 0856      Core Components/Risk Factors/Patient  Goals on Admission    Weight Management Yes;Weight Loss    Intervention Weight Management: Develop a combined nutrition and exercise program designed to reach desired caloric intake, while maintaining appropriate intake of nutrient and fiber, sodium and fats, and appropriate energy expenditure required for the weight goal.;Weight Management: Provide education and appropriate resources to help participant work on and attain dietary goals.    Expected Outcomes Short Term: Continue to assess and modify interventions until short term weight  is achieved;Long Term: Adherence to nutrition and physical activity/exercise program aimed toward attainment of established weight goal;Weight Loss: Understanding of general recommendations for a balanced deficit meal plan, which promotes 1-2 lb weight loss per week and includes a negative energy balance of [phone] kcal/d;Understanding recommendations for meals to include 15-35% energy as protein, 25-35% energy from fat, 35-60% energy from carbohydrates, less than 258m of dietary cholesterol, 20-35 gm of total fiber daily;Understanding of distribution of calorie intake throughout the day with the consumption of 4-5 meals/snacks    Diabetes Yes    Intervention Provide education about signs/symptoms and action to take for hypo/hyperglycemia.;Provide education about proper nutrition, including hydration, and aerobic/resistive exercise prescription along with prescribed medications to achieve blood glucose in normal ranges: Fasting glucose 65-99 mg/dL    Expected Outcomes Short Term: Participant verbalizes understanding of the signs/symptoms and immediate care of hyper/hypoglycemia, proper foot care and importance of medication, aerobic/resistive exercise and nutrition plan for blood glucose control.;Long Term: Attainment of HbA1C < 7%.    Heart Failure Yes    Intervention Provide a combined exercise and nutrition program that is supplemented with education, support and counseling about heart failure. Directed toward relieving symptoms such as shortness of breath, decreased exercise tolerance, and extremity edema.    Expected Outcomes Improve functional capacity of life;Short term: Attendance in program 2-3 days a week with increased exercise capacity. Reported lower sodium intake. Reported increased fruit and vegetable intake. Reports medication compliance.;Short term: Daily weights obtained and reported for increase. Utilizing diuretic protocols set by physician.;Long term: Adoption of self-care skills and  reduction of barriers for early signs and symptoms recognition and intervention leading to self-care maintenance.    Hypertension Yes    Intervention Provide education on lifestyle modifcations including regular physical activity/exercise, weight management, moderate sodium restriction and increased consumption of fresh fruit, vegetables, and low fat dairy, alcohol moderation, and smoking cessation.;Monitor prescription use compliance.    Expected Outcomes Short Term: Continued assessment and intervention until BP is < 140/914mHG in hypertensive participants. < 130/8059mG in hypertensive participants with diabetes, heart failure or chronic kidney disease.;Long Term: Maintenance of blood pressure at goal levels.    Lipids Yes    Intervention Provide education and support for participant on nutrition & aerobic/resistive exercise along with prescribed medications to achieve LDL <14m79mDL >40mg43m Expected Outcomes Short Term: Participant states understanding of desired cholesterol values and is compliant with medications prescribed. Participant is following exercise prescription and nutrition guidelines.;Long Term: Cholesterol controlled with medications as prescribed, with individualized exercise RX and with personalized nutrition plan. Value goals: LDL < 14mg,47m > 40 mg.           Education:Diabetes - Individual verbal and written instruction to review signs/symptoms of diabetes, desired ranges of glucose level fasting, after meals and with exercise. Acknowledge that pre and post exercise glucose checks will be done for 3 sessions at entry of program.   Education: Know Your Numbers and Risk Factors: -Group verbal and written instruction  about important numbers in your health.  Discussion of what are risk factors and how they play a role in the disease process.  Review of Cholesterol, Blood Pressure, Diabetes, and BMI and the role they play in your overall health.   Core Components/Risk  Factors/Patient Goals Review:   Goals and Risk Factor Review    Row Name 10/27/19 1134 11/15/19 1120 12/27/19 1123 12/27/19 1154 01/31/20 1137     Core Components/Risk Factors/Patient Goals Review   Personal Goals Review Weight Management/Obesity;Improve shortness of breath with ADL's;Hypertension;Lipids;Heart Failure Weight Management/Obesity;Improve shortness of breath with ADL's;Hypertension;Lipids;Heart Failure Weight Management/Obesity;Improve shortness of breath with ADL's;Hypertension;Lipids;Heart Failure -- Weight Management/Obesity;Improve shortness of breath with ADL's;Hypertension;Lipids;Heart Failure   Review Adna is taking all meds as directed.  Her weight was up today and she will call her Dr to follow up about fluid weight.  She feels better since she started exercising.  She can do some ADLs easier. Siena is watching her weight and it stays pretty steady with 3-4 lb range.  She has not talked to doctor about her fluid levels and watching her heart failure.  She denies any other heart failure symptoms.  Blood pressures have been good overall. Tniya checks her weight daily.  It is still staying steady.  Her weight was up a little today after celebrating Mothers Day with children.  She had a little shortness of breath today.  She hasnt seen her Dr yet but will first of June. Staff instructed her to call Dr if shortness of breath doesnt improve. Saw pulmonary NP yesterday - she changed some meds and referred Sarinah to [place] RD for weight loss.  She hasnt been exercising at home but feels she can tell improvement when shes doing ADLs.  Staff gave her YouTube info and suggested chair exercise.   Expected Outcomes Short:  follow up with Dr Laverta [place]: manage risk factors Short: Talk to doctor about fluid levels  Long: Continue to manage heart failure Short: follow up with Dr Laverta [place]: manage rsk factors Short: follow up with Dr Laverta [place]: manage risk factors Short: continue medications recommended by Dr  Laverta [place]:  manage risk factors   Row Name 02/14/20 1142             Core Components/Risk Factors/Patient Goals Review   Personal Goals Review Weight Management/Obesity;Improve shortness of breath with ADL's;Hypertension;Lipids;Heart Failure       Review Jentri feels she has been keeping up with ADL's, feels she has been doing better completing her home tasks. She has not set up a consultation or appointment with [place] yet but was encouraged to call back and make contact. She plans to use chair exercises when she watches TV at home. BP's have been stable and is monitored at home. Laurisa thinks her SOB has improved since starting rehab. Will continue to monitor weight. Taking all medications as directed.       Expected Outcomes Short: Continue staying compliant with medications, monitor appropriate ranges for BP, weight, and lipids Long: Continue to manage risk factors              Core Components/Risk Factors/Patient Goals at Discharge (Final Review):   Goals and Risk Factor Review - 02/14/20 1142      Core Components/Risk Factors/Patient Goals Review   Personal Goals Review Weight Management/Obesity;Improve shortness of breath with ADL's;Hypertension;Lipids;Heart Failure    Review Mical feels she has been keeping up with ADL's, feels she has been doing better completing her home tasks. She has  not set up a consultation or appointment with [place] yet but was encouraged to call back and make contact. She plans to use chair exercises when she watches TV at home. BP's have been stable and is monitored at home. Layloni thinks her SOB has improved since starting rehab. Will continue to monitor weight. Taking all medications as directed.    Expected Outcomes Short: Continue staying compliant with medications, monitor appropriate ranges for BP, weight, and lipids Long: Continue to manage risk factors           ITP Comments:  ITP Comments    Row Name 09/21/19 0857 09/22/19  1534 09/26/19 1536 09/27/19 1120 10/12/19 0625   ITP Comments Completed virtual orientation today.  EP eval scheduled for 2/4 at 930. Documentation for diagnosis can be found in [facility] encounter 09/07/19. Completed 6MWT and gym orientation.  Initial ITP created and sent for review to Dr. Emily Filbert, Medical Director. Completed Initial RD Eval First full day of exercise!  Patient was oriented to gym and equipment including functions, settings, policies, and procedures.  Patient's individual exercise prescription and treatment plan were reviewed.  All starting workloads were established based on the results of the 6 minute walk test done at initial orientation visit.  The plan for exercise progression was also introduced and progression will be customized based on patient's performance and goals. 30 day chart review completed. ITP sent to Dr Zachery Dakins Medical Director, for review,changes as needed and signature. New to program   Row Name 10/25/19 1240 11/08/19 1138 11/09/19 0606 12/07/19 0537 01/02/20 1616   ITP Comments Pulse oximeter not picking up HR. Placed on monitor to determine HR and rhythm. Found to be in A Fib. Note sent to referring physician. Patient is not aware of ever being in A fib. She is on Warfarin daily. Dr. Rockey Situ returned fax of afib stating to continue with exercise program 30 day chart review completed. ITP sent to Dr Zachery Dakins Medical Director, for review,changes as needed and signature. Continue with ITP if no changes requested 30 Day review completed. Medical Director review done, changes made as directed,and approval shown by signature of Market researcher. Deajah called out today with a stomach bug.   [place] Name 01/04/20 0543 01/31/20 1132 02/01/20 0519 02/23/20 1156     ITP Comments 30 Day review completed. ITP review done, changes made as directed,and approval shown by signature of  Scientist, research (life sciences). Mishti is wearing a heart monitor 6/11 to 6/25  Doctor "saw a blip  on my EKG"  Monitor to see if more. 30 Day review completed. Medical Director ITP review done, changes made as directed,and signed approval by Medical Director. Jerelyn graduated today from  rehab with 36 sessions completed.  Details of the patient's exercise prescription and what She needs to do in order to continue the prescription and progress were discussed with patient.  Patient was given a copy of prescription and goals.  Patient verbalized understanding.  Brendalee plans to continue to exercise by walking at home..           Comments: Discharge ITP

## 2020-02-23 NOTE — Progress Notes (Signed)
Discharge Progress Report  Patient Details  Name: Ann Flynn MRN: [id number] Date of Birth: Jan 05, 1941 Referring Provider:     Pulmonary Rehab from 09/22/2019 in [facility] Cardiac and Pulmonary Rehab  Referring Provider Glori Bickers MD       Number of Visits: 36  Reason for Discharge:  Patient reached a stable level of exercise. Patient independent in their exercise. Patient has met program and personal goals.  Smoking History:  Social History   Tobacco Use  Smoking Status Never Smoker  Smokeless Tobacco Never Used    Diagnosis:  Pulmonary hypertension ([place])  ADL UCSD:  Pulmonary Assessment Scores    Row Name 09/22/19 1543         ADL UCSD   ADL Phase Entry     SOB Score total 42     Rest 0     Walk 2     Stairs 5     Bath 1     Dress 1     Shop 2       CAT Score   CAT Score 22       mMRC Score   mMRC Score 2            Initial Exercise Prescription:  Initial Exercise Prescription - 09/22/19 1500      Date of Initial Exercise RX and Referring Provider   Date 09/22/19    Referring Provider Bensimhon, Daniel MD      Oxygen   Oxygen Continuous    Liters 3      Treadmill   MPH 0.8    Grade 0    Minutes 15    METs 1.6      NuStep   Level 1    SPM 80    Minutes 15    METs 1.5      T5 Nustep   Level 1    SPM 80    Minutes 15    METs 1.5      Biostep-RELP   Level 1    SPM 50    Minutes 15    METs 1      Prescription Details   Frequency (times per week) 2    Duration Progress to 30 minutes of continuous aerobic without signs/symptoms of physical distress      Intensity   THRR 40-80% of Max Heartrate 94-126    Ratings of Perceived Exertion 11-13    Perceived Dyspnea 0-4      Progression   Progression Continue to progress workloads to maintain intensity without signs/symptoms of physical distress.      Resistance Training   Training Prescription Yes    Weight 3 lb    Reps 10-15           Discharge Exercise  Prescription (Final Exercise Prescription Changes):  Exercise Prescription Changes - 02/14/20 1400      Response to Exercise   Blood Pressure (Admit) 118/64    Blood Pressure (Exercise) 126/62    Blood Pressure (Exit) 118/64    Heart Rate (Admit) 67 bpm    Heart Rate (Exercise) 79 bpm    Heart Rate (Exit) 68 bpm    Oxygen Saturation (Admit) 95 %    Oxygen Saturation (Exercise) 98 %    Oxygen Saturation (Exit) 98 %    Rating of Perceived Exertion (Exercise) 11    Perceived Dyspnea (Exercise) 1    Symptoms none    Duration Continue with 30 min of aerobic exercise without signs/symptoms of physical  distress.    Intensity THRR unchanged      Progression   Progression Continue to progress workloads to maintain intensity without signs/symptoms of physical distress.    Average METs 1.98      Resistance Training   Training Prescription Yes    Weight 3 lb    Reps 10-15      Interval Training   Interval Training No      Oxygen   Oxygen Continuous    Liters 3      Treadmill   MPH 0.8    Grade 0    Minutes 15    METs 1.6      NuStep   Level 5    Minutes 15    METs 2      T5 Nustep   Level 1    Minutes 15    METs 1.8      Home Exercise Plan   Plans to continue exercise at Home (comment)   walking, staff videos   Frequency Add 2 additional days to program exercise sessions.    Initial Home Exercises Provided 11/15/19           Functional Capacity:  6 Minute Walk    Row Name 09/22/19 1534 02/09/20 1143       6 Minute Walk   Phase Initial --    Distance 400 feet 535 feet    Distance % Change -- 33 %    Distance Feet Change -- 135 ft    Walk Time 5 minutes 5.3 minutes    # of Rest Breaks 0  stopped at 5 min 2    MPH 0.91 1.15    METS 0.56 0.45    RPE 17 14    Perceived Dyspnea  3 3    VO2 Peak 1.95 1.6    Symptoms Yes (comment) No    Comments dizzy, hips hurting 8/10 --    Resting HR 62 bpm 59 bpm    Resting BP 134/72 126/62    Resting Oxygen Saturation   99 % 100 %    Exercise Oxygen Saturation  during 6 min walk 86 % 88 %    Max Ex. HR 120 bpm 105 bpm    Max Ex. BP 152/84 120/60    2 Minute Post BP 134/74 --      Interval HR   1 Minute HR 95 82    2 Minute HR 113 93    3 Minute HR 117 91    4 Minute HR 119 105    5 Minute HR 120 105    6 Minute HR 111 94    2 Minute Post HR 87 --    Interval Heart Rate? Yes Yes      Interval Oxygen   Interval Oxygen? Yes Yes    Baseline Oxygen Saturation % 99 % 100 %    1 Minute Oxygen Saturation % 95 % 94 %    1 Minute Liters of Oxygen 3 L 4 L  pulsed    2 Minute Oxygen Saturation % 94 % 94 %    2 Minute Liters of Oxygen 3 L 4 L    3 Minute Oxygen Saturation % 91 % 90 %    3 Minute Liters of Oxygen 3 L 4 L    4 Minute Oxygen Saturation % 85 % 94 %    4 Minute Liters of Oxygen 3 L 4 L    5 Minute Oxygen Saturation % 86 %  93 %    5 Minute Liters of Oxygen 3 L 4 L    6 Minute Oxygen Saturation % 90 % 88 %    6 Minute Liters of Oxygen 3 L 4 L    2 Minute Post Oxygen Saturation % 98 % --    2 Minute Post Liters of Oxygen 3 L 4 L           Psychological, QOL, Others - Outcomes: PHQ 2/9: Depression screen [facility] 2/9 02/14/2020 10/11/2019 09/22/2019 03/28/2019  Decreased Interest _0 0  Down, Depressed, Hopeless 0 _1 PHQ - 2 Score _2 Altered sleeping 0 1 2 -  Tired, decreased energy _3 -  Change in appetite _4 -  Feeling bad or failure about yourself  0 1 2 -  Trouble concentrating 0 0 0 -  Moving slowly or fidgety/restless 0 0 0 -  Suicidal thoughts 0 0 0 -  PHQ-9 Score _5 -  Difficult doing work/chores Not difficult at all Somewhat difficult Somewhat difficult -    Quality of Life: Nutrition & Weight - Outcomes:  Pre Biometrics - 09/22/19 1541      Pre Biometrics   Height 5' 6.5" (1.689 m)    Weight 250 lb 12.8 oz (113.8 kg)    BMI (Calculated) 39.88    Single Leg Stand 1.53 seconds           Nutrition:  Nutrition Therapy & Goals - 09/26/19 1503       Nutrition Therapy   Diet HH diet    Drug/Food Interactions Statins/Certain Fruits;Coumadin/Vit K    Protein (specify units) 125.18g    Fiber 25 grams    Whole Grain Foods 3 servings    Saturated Fats 12 max. grams    Fruits and Vegetables 5 servings/day    Sodium 1.5 grams      Personal Nutrition Goals   Nutrition Goal ST: include more vegetables in an easier manner LT: able to get around better, would like to increase balance, MD said to lose weight.    Comments Pt reports able to do ADL. Toast with peanut butter (wheat, unless its expensive) with 2 cups of coffee (black)and  Juice glass of cranberry juice (mostly water). Pimento cheese cracker or banana and peanut butter, cereal, yogurt.  Meatloaf and baked potato. Pt reports not eating many vegetables. Snacks on fruit. Pt on long lasting insulin; checks BG every morning - takes insulin at night. Discussed HH eating, set-point weight, pulmonary MNT. Lipitor (statin),Colace,Lasix,T2DM: januvia, Insulin Glargine (long lasting),Coumadin,Vitamin D, CKD stg 4, CHF, Pulmonary hypertension      Intervention Plan   Intervention Nutrition handout(s) given to patient.    Expected Outcomes Short Term Goal: Understand basic principles of dietary content, such as calories, fat, sodium, cholesterol and nutrients.;Short Term Goal: A plan has been developed with personal nutrition goals set during dietitian appointment.;Long Term Goal: Adherence to prescribed nutrition plan.           Nutrition Discharge:  Nutrition Assessments - 09/22/19 1542      MEDFICTS Scores   Pre Score 56           Education Questionnaire Score:  Knowledge Questionnaire Score - 09/22/19 1542      Knowledge Questionnaire Score   Pre Score 18/18           Goals reviewed with patient; copy given to patient.

## 2020-02-23 NOTE — Progress Notes (Addendum)
Daily Session Note  Patient Details  Name: Mykell Genao MRN: [id number] Date of Birth: 1941-01-13 Referring Provider:     Pulmonary Rehab from 09/22/2019 in [facility] Cardiac and Pulmonary Rehab  Referring Provider Glori Bickers MD      Encounter Date: 02/23/2020  Check In:  Session Check In - 02/23/20 1115      Check-In   Supervising physician immediately available to respond to emergencies See telemetry face sheet for immediately available ER MD    Location [facility]-Cardiac & Pulmonary Rehab    Staff Present Renita Papa, RN BSN;Joseph Foy [place], [place], ACSM CEP, Exercise Physiologist    Virtual Visit No    Medication changes reported     No    Fall or balance concerns reported    No    Warm-up and Cool-down Performed on first and last piece of equipment    Resistance Training Performed Yes    VAD Patient? No    PAD/SET Patient? No      Pain Assessment   Currently in Pain? No/denies              Social History   Tobacco Use  Smoking Status Never Smoker  Smokeless Tobacco Never Used    Goals Met:  Independence with exercise equipment Exercise tolerated well No report of cardiac concerns or symptoms Strength training completed today  Goals Unmet:  Not Applicable  Comments:  Hyacinth graduated today from  rehab with 36 sessions completed.  Details of the patient's exercise prescription and what She needs to do in order to continue the prescription and progress were discussed with patient.  Patient was given a copy of prescription and goals.  Patient verbalized understanding.  Fatimata plans to continue to exercise by walking at home. .    Dr. Emily Filbert is Medical Director for [facility].

## 2020-02-27 ENCOUNTER — Other Ambulatory Visit: Payer: Self-pay

## 2020-02-27 ENCOUNTER — Encounter: Payer: Medicare Other | Admitting: Dietician

## 2020-02-27 ENCOUNTER — Encounter: Payer: Self-pay | Admitting: Dietician

## 2020-02-27 VITALS — Ht 65.0 in | Wt 237.4 lb

## 2020-02-27 DIAGNOSIS — I270 Primary pulmonary hypertension: Secondary | ICD-10-CM | POA: Diagnosis not present

## 2020-02-27 DIAGNOSIS — E785 Hyperlipidemia, unspecified: Secondary | ICD-10-CM

## 2020-02-27 DIAGNOSIS — E1159 Type 2 diabetes mellitus with other circulatory complications: Secondary | ICD-10-CM

## 2020-02-27 DIAGNOSIS — N184 Chronic kidney disease, stage 4 (severe): Secondary | ICD-10-CM

## 2020-02-27 NOTE — Patient Instructions (Signed)
   Include 1 vegetable with lunch and dinner   If canned vegetables "no added salt" or "low sodium"   Try "chair yoga" at least once a week

## 2020-02-27 NOTE — Progress Notes (Signed)
Medical Nutrition Therapy: Visit start time: 0915  end time: 1030  Assessment:  Diagnosis: shortness of breath Past medical history: T2DM, HTN, CHF, high cholesterol, sleep apnea Psychosocial issues/ stress concerns: pt rates stress as moderate and eating as main way she deals with stress  Preferred learning method:  . Auditory . Visual . Hands-on  Current weight: 237.4 lbs  Height: 5'5"  Medications, supplements: reconciled in medical record  Oxygen- 3L/day   Progress and evaluation:  Pt reports weight of 255 lbs 09/2019 due to fluid retention.  Pt reports following prescribed 2L fluid restriction.  Pt states she has had diabetes for about 20 years.  Tests BGs once a day in the morning.  Pt reports BGs usually 125-175. Pt reported A1c- 7.5     Physical activity: ADLs  Dietary Intake:  Usual eating pattern includes 3 meals and 1 snacks per day. Dining out frequency: 1 meals per week.  Breakfast: wheat toast  with PB with banana Lunch: pimento cheese sandwich; cheese and crackers  Supper: airfried chicken breasts with SF jello with cottage cheese and pineapple salad  Snack: sugar free fudge-sicle, cherries and grapes    Beverages: 2L fluid restriction; water, coffee, 1 pepsi week   Nutrition Care Education: Basic nutrition: basic food groups, appropriate nutrient balance, appropriate meal and snack schedule, general nutrition guidelines    Weight control: importance of low sugar and low fat choices, portion control strategies  Advanced nutrition:  food label reading, calorie dense v. nutrient dense foods  Diabetes:  appropriate meal and snack schedule, appropriate carb intake and balance, healthy carb choices, role of fiber, protein, fat  Hypertension: identifying high sodium foods Hyperlipidemia: healthy and unhealthy fats, role of fiber, role of exercise Other lifestyle changes:  benefits of making changes  Nutritional Diagnosis:  NB-1.1 Food and nutrition-related knowledge  deficit As related to unable to recall previous nutrition-related information about T2DM and CKD.  As evidenced by pt diet recall, incomplete nutrition information.  Intervention:  Pt diet has low vegetable intake and low fiber intake  Increase vegetable consumption at snacks and dinner  Swap refined grains (bread, crackers) for whole grain alternatives   Modified protein consumption   Continue to eat lean protein sources and plant-based protein sources   Portion control protein foods to 1-3 oz at meals   Adhere to 2L Fluid restriction set by doctor  Review all sources of fluids (jello, icecream, popsicles, etc)   Avoid sugar-sweetened beverages   Maintain <2L/day   Electrolyte Modification   Limit foods high in phosphorus and potassium   Education Materials given:  . General diet guidelines for Heart health . CKD Stage 3-5 Nutrition Therapy . Plate Planner with food lists  . Goals/ instructions  Learner/ who was taught:  . Patient   Level of understanding: [name] Verbalizes/ demonstrates competency  Demonstrated degree of understanding via:   Teach back Learning barriers: . None  Willingness to learn/ readiness for change: . Eager, change in progress  Monitoring and Evaluation:  Dietary intake, exercise, BG, electrolytes, and body weight      follow up: prn

## 2020-02-28 ENCOUNTER — Ambulatory Visit (INDEPENDENT_AMBULATORY_CARE_PROVIDER_SITE_OTHER): Payer: Medicare Other

## 2020-02-28 DIAGNOSIS — Z8673 Personal history of transient ischemic attack (TIA), and cerebral infarction without residual deficits: Secondary | ICD-10-CM

## 2020-02-28 LAB — POCT INR
INR: 2.7 (ref 2.0–3.0)
PT: 32.2

## 2020-02-28 NOTE — Patient Instructions (Signed)
Description   Current Dose: 3mg on T,TH,Sat except 4 mg the other days. Changes: No changes  Recheck in 1 weeks     

## 2020-03-05 ENCOUNTER — Ambulatory Visit (INDEPENDENT_AMBULATORY_CARE_PROVIDER_SITE_OTHER): Payer: Medicare Other | Admitting: Gastroenterology

## 2020-03-05 ENCOUNTER — Other Ambulatory Visit: Payer: Self-pay

## 2020-03-05 ENCOUNTER — Encounter: Payer: Self-pay | Admitting: Gastroenterology

## 2020-03-05 VITALS — BP 97/56 | HR 56 | Temp 97.8°F | Ht 63.0 in | Wt 238.0 lb

## 2020-03-05 DIAGNOSIS — R195 Other fecal abnormalities: Secondary | ICD-10-CM | POA: Diagnosis not present

## 2020-03-05 DIAGNOSIS — D509 Iron deficiency anemia, unspecified: Secondary | ICD-10-CM

## 2020-03-05 MED ORDER — NA SULFATE-K SULFATE-MG SULF 17.5-3.13-1.6 GM/177ML PO SOLN: ORAL | 0 refills | Status: DC

## 2020-03-05 NOTE — Progress Notes (Signed)
Ann Flynn [address]  [place]  [place], [place] [address]  Main: [phone]  Fax: [phone]   Gastroenterology Consultation  Referring Provider:     Paulene Floor Primary Care Physician:  Paulene Floor Reason for Consultation:    Positive FOBT, IDA        HPI:    Chief Complaint  Patient presents with  . New Patient (Initial Visit)  . Anemia    Ann Flynn is a 79 y.o. y/o female referred for consultation & management  by Dr. Terrilee Croak, Wendee Beavers, PA-C.  Patient recently found deficiency anemia stool test was positive for occult blood.  Patient reports [place]  Does not recall the name of the center where he was done.  States it was normal and no polyps were found.  Had a colonoscopy once prior to that one polyp was found at that time.  Patient denies any sources of active bleeding. The patient denies abdominal or flank pain, anorexia, nausea or vomiting, dysphagia, change in bowel habits or black or bloody stools or weight loss.  No family history of colon cancer.  Patient is on home oxygen for 5 years.   History reviewed. No pertinent past medical history.  Past Surgical History:  Procedure Laterality Date  . ABDOMINAL HYSTERECTOMY  1997  . cartiod artery surgery  2010   per patient   . CHOLECYSTECTOMY  10/19/2009  . MASTECTOMY  1994  . RIGHT HEART CATH N/A 07/29/2019   Procedure: RIGHT HEART CATH;  Surgeon: Jolaine Artist, MD;  Location: [facility];  Service: Cardiovascular;  Laterality: N/A;    Prior to Admission medications   Medication Sig Start Date End Date Taking? Authorizing Provider  acetaminophen (TYLENOL) 500 MG tablet Take 500 mg by mouth 2 (two) times daily.    Yes [provider]  ambrisentan (LETAIRIS) 5 MG tablet Take 1 tablet (5 mg total) by mouth daily. Patient taking differently: Take 5 mg by mouth at bedtime.  10/10/19  Yes Kasa, Maretta Bees, MD    atorvastatin (LIPITOR) 80 MG tablet TAKE 1 TABLET BY MOUTH DAILY 12/28/19  Yes Trinna Post, PA-C  BD PEN NEEDLE NANO U/F 32G X 4 MM MISC USE TO INJECT ONCE DAILY AS DIRECTED 11/30/19  Yes Carles Collet M, PA-C  Cyanocobalamin (B-12 PO) Take 1 tablet by mouth daily.   Yes [provider]  docusate sodium (COLACE) 100 MG capsule Take 100 mg by mouth at bedtime.    Yes [provider]  ferrous sulfate 325 (65 FE) MG EC tablet Take 1 tablet (325 mg total) by mouth 3 (three) times daily with meals. 01/20/20 03/05/20 Yes Pollak, Wendee Beavers, PA-C  losartan (COZAAR) 100 MG tablet TAKE ONE TABLET EVERY DAY Patient taking differently: at bedtime.  02/10/20  Yes Trinna Post, PA-C  metoprolol tartrate (LOPRESSOR) 25 MG tablet TAKE 1 TABLET BY MOUTH TWICE DAILY 01/26/20  Yes Terrilee Croak, Adriana M, PA-C  omeprazole (PRILOSEC) 40 MG capsule TAKE 1 CAPSULE BY MOUTH ONCE DAILY 01/26/20  Yes Trinna Post, PA-C  [facility] ULTRA test strip USE AS DIRECTED 12/14/19  Yes Trinna Post, PA-C  tadalafil, PAH, (ADCIRCA) 20 MG tablet Take 2 tablets (40 mg total) by mouth daily. Patient taking differently: Take 40 mg by mouth at bedtime.  11/07/19  Yes Flora Lipps, MD  torsemide (DEMADEX) 20 MG tablet Take 2 tablets (40 mg total) by mouth  daily. Patient taking differently: Take 20 mg by mouth 2 (two) times daily.  01/18/20  Yes Pollak, Adriana M, PA-C  TOUJEO SOLOSTAR 300 UNIT/ML Solostar Pen INJECT 50 UNITS INTO THE SKIN DAILY Patient taking differently: at bedtime.  01/04/20  Yes Trinna Post, PA-C  venlafaxine (EFFEXOR) 75 MG tablet TAKE ONE TABLET EVERY DAY Patient taking differently: every other day.  02/10/20  Yes Trinna Post, PA-C  VITAMIN D PO Take 2,000 Units by mouth daily.    Yes [provider]  warfarin (COUMADIN) 3 MG tablet TAKE 1 TABLET BY MOUTH ON MONDAY WEDNESDAY AND FRIDAY ALONG WITH 4MG ON THE REMAINING DAYS Patient taking differently: TAKE 3 TABLET BY MOUTH  ON Tuesday, Thursday, Saturday 4MG ON THE REMAINING DAYS 11/11/19  Yes Flinchum, Kelby Aline, FNP  warfarin (COUMADIN) 4 MG tablet Take 4 mg by mouth daily. Tuesday, Thursday, Saturday and Sunday   Yes [provider]  Dulaglutide (TRULICITY) 4.88 QB/1.6XI SOPN Inject 0.75 mg into the skin once a week. Patient not taking: Reported on 02/17/2020 07/06/19   Trinna Post, PA-C  empagliflozin (JARDIANCE) 10 MG TABS tablet Take 1 tablet (10 mg total) by mouth daily before breakfast. Patient not taking: Reported on 03/05/2020 01/27/20   Bensimhon, Shaune Pascal, MD  Na Sulfate-K Sulfate-Mg Sulf 17.5-3.13-1.6 GM/177ML SOLN At 5 PM the day before procedure take 1 bottle and 5 hours before procedure take 1 bottle. 03/05/20   [name], MD    History reviewed. No pertinent family history.   Social History   Tobacco Use  . Smoking status: Never Smoker  . Smokeless tobacco: Never Used  Substance Use Topics  . Alcohol use: Never  . Drug use: Never    Allergies as of 03/05/2020 - Review Complete 03/05/2020  Allergen Reaction Noted  . Penicillins Swelling and Rash 03/28/2019  . Sulfa antibiotics Rash 03/28/2019    Review of Systems:    All systems reviewed and negative except where noted in HPI.   Physical Exam:  BP (!) 97/56   Pulse (!) 56   Temp 97.8 F (36.6 C) (Oral)   Ht _0  (1.6 m)   Wt 238 lb (108 kg)   BMI 42.16 kg/m  No LMP recorded. Patient is postmenopausal. Psych:  Alert and cooperative. Normal mood and affect. General:   Alert,  Well-developed, well-nourished, pleasant and cooperative in NAD Head:  Normocephalic and atraumatic. Eyes:  Sclera clear, no icterus.   Conjunctiva pink. Ears:  Normal auditory acuity. Nose:  No deformity, discharge, or lesions. Mouth:  No deformity or lesions,oropharynx pink & moist. Neck:  Supple; no masses or thyromegaly. Abdomen:  Normal bowel sounds.  No bruits.  Soft, non-tender and non-distended without masses,  hepatosplenomegaly or hernias noted.  No guarding or rebound tenderness.    Msk:  Symmetrical without gross deformities. Good, equal movement & strength bilaterally. Pulses:  Normal pulses noted. Extremities:  No clubbing or edema.  No cyanosis. Neurologic:  Alert and oriented x3;  grossly normal neurologically. Skin:  Intact without significant lesions or rashes. No jaundice. Lymph Nodes:  No significant cervical adenopathy. Psych:  Alert and cooperative. Normal mood and affect.   Labs: CBC    Component Value Date/Time   WBC 4.7 02/14/2020 1058   WBC 5.5 11/05/2019 1203   RBC 3.45 (L) 02/14/2020 1058   RBC 3.61 (L) 11/05/2019 1203   HGB 10.0 (L) 02/14/2020 1058   HCT 31.1 (L) 02/14/2020 1058   PLT 219 02/14/2020  1058   MCV 90 02/14/2020 1058   MCH 29.0 02/14/2020 1058   MCH 28.8 11/05/2019 1203   MCHC 32.2 02/14/2020 1058   MCHC 30.1 11/05/2019 1203   RDW 16.2 (H) 02/14/2020 1058   LYMPHSABS 1.3 02/14/2020 1058   EOSABS 0.2 02/14/2020 1058   BASOSABS 0.0 02/14/2020 1058   CMP     Component Value Date/Time   NA 142 01/27/2020 1150   NA 141 01/18/2020 1155   K 5.2 (H) 01/27/2020 1150   CL 105 01/27/2020 1150   CO2 27 01/27/2020 1150   GLUCOSE 155 (H) 01/27/2020 1150   BUN 59 (H) 01/27/2020 1150   BUN 51 (H) 01/18/2020 1155   CREATININE 2.13 (H) 01/27/2020 1150   CALCIUM 8.7 (L) 01/27/2020 1150   PROT 6.0 (L) 01/27/2020 1150   PROT 6.1 01/18/2020 1155   ALBUMIN 3.5 01/27/2020 1150   ALBUMIN 4.0 01/18/2020 1155   AST 16 01/27/2020 1150   ALT 12 01/27/2020 1150   ALKPHOS 56 01/27/2020 1150   BILITOT 0.5 01/27/2020 1150   BILITOT 0.3 01/18/2020 1155   GFRNONAA 22 (L) 01/27/2020 1150   GFRAA 25 (L) 01/27/2020 1150    Imaging Studies: No results found.  Assessment and Plan:   Ann Flynn is a 79 y.o. y/o female has been referred for positive occult blood test and iron deficiency anemia  Colonoscopy indicated for positive occult blood test EGD indicated for  iron deficiency anemia as well  Alternative options of conservative management were discussed in detail, including but not limited to foregoing endoscopic procedures at this time and others.    I have discussed alternative options, risks & benefits,  which include, but are not limited to, bleeding, infection, perforation,respiratory complication & drug reaction.  The patient agrees with this plan & written consent will be obtained.    Patient will need pulmonology clearance and warfarin clearance prior to her procedures.  Schedule at [facility].     Dr Ann Flynn  Speech recognition software was used to dictate the above note.

## 2020-03-05 NOTE — Patient Instructions (Signed)
We will need to obtain Warfarin and Pulmonology clearance from your doctors. Then, we will let you know what you will need to do for your Warfarin.

## 2020-03-06 ENCOUNTER — Ambulatory Visit (INDEPENDENT_AMBULATORY_CARE_PROVIDER_SITE_OTHER): Payer: Medicare Other

## 2020-03-06 DIAGNOSIS — Z8673 Personal history of transient ischemic attack (TIA), and cerebral infarction without residual deficits: Secondary | ICD-10-CM

## 2020-03-06 LAB — POCT INR
INR: 2.8 (ref 2.0–3.0)
PT: 33.5

## 2020-03-06 NOTE — Patient Instructions (Signed)
Description   4 mg q d except 3 mg T, Th & Sat F/U 4 weeks

## 2020-03-15 ENCOUNTER — Telehealth: Payer: Self-pay

## 2020-03-15 ENCOUNTER — Other Ambulatory Visit: Payer: Self-pay | Admitting: Physician Assistant

## 2020-03-15 DIAGNOSIS — E611 Iron deficiency: Secondary | ICD-10-CM

## 2020-03-15 NOTE — Telephone Encounter (Signed)
Clearances were faxed to cardiology and pulmonary for clearance and for warfarin. Now awaiting on response.

## 2020-03-15 NOTE — Addendum Note (Signed)
Encounter addended by: Micki Riley, RN on: 03/15/2020 2:35 PM  Actions taken: Imaging Exam ended

## 2020-03-19 NOTE — Telephone Encounter (Signed)
Checked into patient's chart for clearances and I have not received them back. I will refax the clearances and try to call them. Patient was contacted to let her know that I would have to cancel her procedure due to not obtaining clearances from neither of her providers. Patient was understanding and I told her that once I got clearance, I would call her back to reschedule procedures. Wannetta Sender will be contacted as well.

## 2020-03-20 ENCOUNTER — Inpatient Hospital Stay: Admission: RE | Admit: 2020-03-20 | Payer: Medicare Other | Source: Ambulatory Visit

## 2020-03-22 ENCOUNTER — Encounter: Admission: RE | Payer: Self-pay | Source: Home / Self Care

## 2020-03-22 ENCOUNTER — Inpatient Hospital Stay: Admission: RE | Admit: 2020-03-22 | Payer: Medicare Other | Source: Ambulatory Visit

## 2020-03-22 ENCOUNTER — Ambulatory Visit: Admission: RE | Admit: 2020-03-22 | Payer: Medicare Other | Source: Home / Self Care | Admitting: Gastroenterology

## 2020-03-22 SURGERY — COLONOSCOPY WITH PROPOFOL
Anesthesia: General

## 2020-03-23 ENCOUNTER — Emergency Department (HOSPITAL_COMMUNITY): Payer: Medicare Other

## 2020-03-23 ENCOUNTER — Encounter (HOSPITAL_COMMUNITY): Payer: Self-pay

## 2020-03-23 ENCOUNTER — Observation Stay (HOSPITAL_COMMUNITY)
Admission: EM | Admit: 2020-03-23 | Discharge: 2020-03-24 | Disposition: A | Discharge status: Home / Self Care | Payer: Medicare Other | Attending: Family Medicine | Admitting: Family Medicine

## 2020-03-23 DIAGNOSIS — Z20822 Contact with and (suspected) exposure to covid-19: Secondary | ICD-10-CM | POA: Insufficient documentation

## 2020-03-23 DIAGNOSIS — Y999 Unspecified external cause status: Secondary | ICD-10-CM | POA: Diagnosis not present

## 2020-03-23 DIAGNOSIS — R55 Syncope and collapse: Secondary | ICD-10-CM

## 2020-03-23 DIAGNOSIS — S065X0A Traumatic subdural hemorrhage without loss of consciousness, initial encounter: Secondary | ICD-10-CM | POA: Diagnosis present

## 2020-03-23 DIAGNOSIS — E1122 Type 2 diabetes mellitus with diabetic chronic kidney disease: Secondary | ICD-10-CM | POA: Diagnosis not present

## 2020-03-23 DIAGNOSIS — I272 Pulmonary hypertension, unspecified: Secondary | ICD-10-CM

## 2020-03-23 DIAGNOSIS — Z79899 Other long term (current) drug therapy: Secondary | ICD-10-CM | POA: Diagnosis not present

## 2020-03-23 DIAGNOSIS — W1839XA Other fall on same level, initial encounter: Secondary | ICD-10-CM | POA: Insufficient documentation

## 2020-03-23 DIAGNOSIS — I482 Chronic atrial fibrillation, unspecified: Secondary | ICD-10-CM

## 2020-03-23 DIAGNOSIS — S065XAA Traumatic subdural hemorrhage with loss of consciousness status unknown, initial encounter: Secondary | ICD-10-CM

## 2020-03-23 DIAGNOSIS — Y939 Activity, unspecified: Secondary | ICD-10-CM | POA: Diagnosis not present

## 2020-03-23 DIAGNOSIS — E119 Type 2 diabetes mellitus without complications: Secondary | ICD-10-CM

## 2020-03-23 DIAGNOSIS — S0001XA Abrasion of scalp, initial encounter: Secondary | ICD-10-CM | POA: Insufficient documentation

## 2020-03-23 DIAGNOSIS — I129 Hypertensive chronic kidney disease with stage 1 through stage 4 chronic kidney disease, or unspecified chronic kidney disease: Secondary | ICD-10-CM | POA: Insufficient documentation

## 2020-03-23 DIAGNOSIS — Y929 Unspecified place or not applicable: Secondary | ICD-10-CM | POA: Diagnosis not present

## 2020-03-23 DIAGNOSIS — S065X9A Traumatic subdural hemorrhage with loss of consciousness of unspecified duration, initial encounter: Secondary | ICD-10-CM | POA: Diagnosis present

## 2020-03-23 DIAGNOSIS — N189 Chronic kidney disease, unspecified: Secondary | ICD-10-CM | POA: Diagnosis not present

## 2020-03-23 HISTORY — DX: Essential (primary) hypertension: I10

## 2020-03-23 HISTORY — DX: Anemia, unspecified: D64.9

## 2020-03-23 HISTORY — DX: Pulmonary hypertension, unspecified: I27.20

## 2020-03-23 HISTORY — DX: Chronic kidney disease, unspecified: N18.9

## 2020-03-23 HISTORY — DX: Type 2 diabetes mellitus without complications: E11.9

## 2020-03-23 HISTORY — DX: Unspecified atrial fibrillation: I48.91

## 2020-03-23 LAB — PROTIME-INR
INR: 3 — ABNORMAL HIGH (ref 0.8–1.2)
Prothrombin Time: 30.1 seconds — ABNORMAL HIGH (ref 11.4–15.2)

## 2020-03-23 LAB — CBC WITH DIFFERENTIAL/PLATELET
Abs Immature Granulocytes: 0.02 10*3/uL (ref 0.00–0.07)
Basophils Absolute: 0 10*3/uL (ref 0.0–0.1)
Basophils Relative: 1 %
Eosinophils Absolute: 0.3 10*3/uL (ref 0.0–0.5)
Eosinophils Relative: 4 %
HCT: 33.9 % — ABNORMAL LOW (ref 36.0–46.0)
Hemoglobin: 10.2 g/dL — ABNORMAL LOW (ref 12.0–15.0)
Immature Granulocytes: 0 %
Lymphocytes Relative: 27 %
Lymphs Abs: 1.9 10*3/uL (ref 0.7–4.0)
MCH: 28.9 pg (ref 26.0–34.0)
MCHC: 30.1 g/dL (ref 30.0–36.0)
MCV: 96 fL (ref 80.0–100.0)
Monocytes Absolute: 0.5 10*3/uL (ref 0.1–1.0)
Monocytes Relative: 7 %
Neutro Abs: 4.2 10*3/uL (ref 1.7–7.7)
Neutrophils Relative %: 61 %
Platelets: 217 10*3/uL (ref 150–400)
RBC: 3.53 MIL/uL — ABNORMAL LOW (ref 3.87–5.11)
RDW: 17.6 % — ABNORMAL HIGH (ref 11.5–15.5)
WBC: 6.8 10*3/uL (ref 4.0–10.5)
nRBC: 0 % (ref 0.0–0.2)

## 2020-03-23 LAB — BASIC METABOLIC PANEL
Anion gap: 11 (ref 5–15)
BUN: 81 mg/dL — ABNORMAL HIGH (ref 8–23)
CO2: 26 mmol/L (ref 22–32)
Calcium: 9 mg/dL (ref 8.9–10.3)
Chloride: 103 mmol/L (ref 98–111)
Creatinine, Ser: 2.67 mg/dL — ABNORMAL HIGH (ref 0.44–1.00)
GFR calc Af Amer: 19 mL/min — ABNORMAL LOW (ref >60)
GFR calc non Af Amer: 16 mL/min — ABNORMAL LOW (ref >60)
Glucose, Bld: 209 mg/dL — ABNORMAL HIGH (ref 70–99)
Potassium: 4.3 mmol/L (ref 3.5–5.1)
Sodium: 140 mmol/L (ref 135–145)

## 2020-03-23 MED ORDER — SODIUM CHLORIDE 0.9 % IV BOLUS
500.0000 mL | Freq: Once | INTRAVENOUS | Status: AC
  Administered 2020-03-24: 500 mL via INTRAVENOUS

## 2020-03-23 NOTE — ED Triage Notes (Signed)
Pt was out walking and had a witnessed fall, possible syncopal episode, fell hit head, on coumadin, lac to back of head, in afib, wear chronic 3L

## 2020-03-23 NOTE — Progress Notes (Signed)
Orthopedic Tech Progress Note Patient Details:  Ann Flynn [birth year] [id number] Level 2 Trauma  Patient ID: Ann Flynn, female   DOB: August 31, 1940, 79 y.o.   MRN: [id number]   Ann Flynn 03/23/2020, 10:15 PM

## 2020-03-23 NOTE — ED Provider Notes (Signed)
[facility] EMERGENCY DEPARTMENT Provider Note   CSN: [id number] Arrival date & time: 03/23/20  2154     History Chief Complaint  Patient presents with  . Fall    Ann Flynn is a 79 y.o. female.  HPI  Patient presents to the emergency department via EMS.  Patient was reportedly ambulating earlier tonight when her oxygen tank ran out of oxygen.  She wears 3 L nasal cannula at baseline for history of pulmonary hypertension.  Patient does take Coumadin for her atrial fibrillation.  Patient reports as they are walking back she began to feel dizzy and passed out.  She complains of mild headache on arrival.  No other complaints.  No recent illnesses.  No sick contacts.  Small laceration to the posterior scalp.     Past Medical History:  Diagnosis Date  . A-fib ([place])   . Anemia   . Chronic kidney disease   . Diabetes mellitus without complication ([place])   . Hypertension   . Pulmonary hypertension [facility])     Patient Active Problem List   Diagnosis Date Noted  . Atrial fibrillation, chronic ([place]) 03/24/2020  . Type 2 diabetes mellitus with stage 4 chronic kidney disease ([place]) 03/24/2020  . Syncope 03/24/2020  . Pulmonary hypertension ([place]) 03/24/2020  . Subdural hematoma, acute ([place]) 03/24/2020    Past Surgical History:  Procedure Laterality Date  . ABDOMINAL HYSTERECTOMY    . masectomy       OB History   No obstetric history on file.     Family History  Problem Relation Age of Onset  . CAD Mother   . Heart disease Father   . CAD Maternal Grandmother     Social History   Tobacco Use  . Smoking status: Never Smoker  . Smokeless tobacco: Never Used  Substance Use Topics  . Alcohol use: Never  . Drug use: Never    Home Medications Prior to Admission medications   Medication Sig Start Date End Date Taking? Authorizing Provider  ambrisentan (LETAIRIS) 5 MG tablet Take 5 mg by mouth at bedtime. 03/07/20  Yes [provider]    amLODipine (NORVASC) 5 MG tablet Take 5 mg by mouth daily. 02/10/20  Yes [provider]  atorvastatin (LIPITOR) 80 MG tablet Take 80 mg by mouth daily. 12/28/19  Yes [provider]  FEROSUL 325 (65 Fe) MG tablet Take 325 mg by mouth 3 (three) times daily. 03/15/20  Yes [provider]  JANUVIA 25 MG tablet Take 25 mg by mouth daily. 03/08/20  Yes [provider]  losartan (COZAAR) 100 MG tablet Take 100 mg by mouth daily. 02/10/20  Yes [provider]  metoprolol tartrate (LOPRESSOR) 25 MG tablet Take 25 mg by mouth 2 (two) times daily. 01/26/20  Yes [provider]  omeprazole (PRILOSEC) 40 MG capsule Take 40 mg by mouth daily. 01/26/20  Yes [provider]  torsemide (DEMADEX) 20 MG tablet Take 40 mg by mouth daily. 01/18/20  Yes [provider]  TOUJEO SOLOSTAR 300 UNIT/ML Solostar Pen Inject 50 Units into the skin at bedtime. 03/06/20  Yes [provider]  venlafaxine (EFFEXOR) 75 MG tablet Take 75 mg by mouth daily. 02/10/20  Yes [provider]  warfarin (COUMADIN) 3 MG tablet Take 3 mg by mouth Every Tuesday,Thursday,and Saturday with dialysis. 11/11/19  Yes [provider]  warfarin (COUMADIN) 4 MG tablet Take 4 mg by mouth See admin instructions. Sunday, Monday, Wednesday and Friday 12/13/19  Yes [provider]    Allergies    Penicillins and Sulfa antibiotics  Review of Systems   Review of Systems  Constitutional: Negative for chills and fever.  HENT: Negative for ear pain and sore throat.   Eyes: Negative for pain and visual disturbance.  Respiratory: Negative for cough and shortness of breath.   Cardiovascular: Negative for chest pain and palpitations.  Gastrointestinal: Negative for abdominal pain and vomiting.  Genitourinary: Negative for dysuria and hematuria.  Musculoskeletal: Negative for arthralgias and back pain.  Skin: Positive for wound. Negative for color change and  rash.  Neurological: Positive for headaches. Negative for seizures and syncope.  All other systems reviewed and are negative.   Physical Exam Updated Vital Signs BP (!) 127/47 (BP Location: Left Arm)   Pulse 65   Temp 97.7 F (36.5 C) (Oral)   Resp 18   Ht _0  (1.651 m)   Wt 110.7 kg   SpO2 98%   BMI 40.60 kg/m   Physical Exam Vitals and nursing note reviewed.  Constitutional:      General: She is not in acute distress.    Appearance: Normal appearance. She is well-developed and normal weight. She is not ill-appearing or toxic-appearing.  HENT:     Head: Normocephalic and atraumatic.     Right Ear: Tympanic membrane normal.     Left Ear: Tympanic membrane normal.  Eyes:     Extraocular Movements: Extraocular movements intact.     Conjunctiva/sclera: Conjunctivae normal.     Pupils: Pupils are equal, round, and reactive to light.  Cardiovascular:     Rate and Rhythm: Normal rate and regular rhythm.     Heart sounds: No murmur heard.   Pulmonary:     Effort: Pulmonary effort is normal. No respiratory distress.     Breath sounds: Normal breath sounds.  Abdominal:     Palpations: Abdomen is soft.     Tenderness: There is no abdominal tenderness.  Musculoskeletal:     Cervical back: Normal, normal range of motion and neck supple. No tenderness. Normal range of motion.  Skin:    General: Skin is warm and dry.  Neurological:     General: No focal deficit present.     Mental Status: She is alert and oriented to person, place, and time. Mental status is at baseline.     GCS: GCS eye subscore is 4. GCS verbal subscore is 5. GCS motor subscore is 6.     Cranial Nerves: Cranial nerves are intact.     Motor: Motor function is intact. No weakness, abnormal muscle tone or seizure activity.  Psychiatric:        Mood and Affect: Mood normal.        Behavior: Behavior normal.     ED Results / Procedures / Treatments   Labs (all labs ordered are listed, but only abnormal  results are displayed) Labs Reviewed  CBC WITH DIFFERENTIAL/PLATELET - Abnormal; Notable for the following components:      Result Value   RBC 3.53 (*)    Hemoglobin 10.2 (*)    HCT 33.9 (*)    RDW 17.6 (*)    All other components within normal limits  BASIC METABOLIC PANEL - Abnormal; Notable for the following components:   Glucose, Bld 209 (*)    BUN 81 (*)    Creatinine, Ser 2.67 (*)    GFR calc non Af Amer 16 (*)    GFR calc Af Amer 19 (*)  All other components within normal limits  PROTIME-INR - Abnormal; Notable for the following components:   Prothrombin Time 30.1 (*)    INR 3.0 (*)    All other components within normal limits  BASIC METABOLIC PANEL - Abnormal; Notable for the following components:   Glucose, Bld 225 (*)    BUN 77 (*)    Creatinine, Ser 2.44 (*)    GFR calc non Af Amer 18 (*)    GFR calc Af Amer 21 (*)    All other components within normal limits  CBC - Abnormal; Notable for the following components:   RBC 3.48 (*)    Hemoglobin 9.9 (*)    HCT 33.6 (*)    MCHC 29.5 (*)    RDW 17.6 (*)    All other components within normal limits  HEMOGLOBIN A1C - Abnormal; Notable for the following components:   Hgb A1c MFr Bld 7.9 (*)    All other components within normal limits  GLUCOSE, CAPILLARY - Abnormal; Notable for the following components:   Glucose-Capillary 202 (*)    All other components within normal limits  GLUCOSE, CAPILLARY - Abnormal; Notable for the following components:   Glucose-Capillary 110 (*)    All other components within normal limits  TROPONIN I (HIGH SENSITIVITY) - Abnormal; Notable for the following components:   Troponin I (High Sensitivity) 30 (*)    All other components within normal limits  TROPONIN I (HIGH SENSITIVITY) - Abnormal; Notable for the following components:   Troponin I (High Sensitivity) 34 (*)    All other components within normal limits  SARS CORONAVIRUS 2 BY RT PCR [facility] ORDER, [place] LAB)    EKG EKG Interpretation  Date/Time:  Friday March 23 2020 22:04:22 EDT Ventricular Rate:  98 PR Interval:    QRS Duration: 96 QT Interval:  435 QTC Calculation: 581 R Axis:   84 Text Interpretation: Atrial flutter Borderline right axis deviation Borderline repolarization abnormality ST elevation, consider inferior injury Prolonged QT interval No old tracing to compare Confirmed by Isla Pence [phone]) on 03/23/2020 10:30:48 PM Also confirmed by Isla Pence [phone]), editor Victory Dakin [phone])  on 03/24/2020 9:36:07 AM   Radiology DG Chest 1 View  Result Date: 03/23/2020 CLINICAL DATA:  Fall EXAM: CHEST  1 VIEW COMPARISON:  07/07/2019 FINDINGS: Right breast prosthesis. Cardiomegaly. No acute consolidation or effusion. Aortic atherosclerosis. Calcified granulomas in the lungs. No pneumothorax. IMPRESSION: No active disease. Cardiomegaly. Electronically Signed   By: Donavan Foil M.D.   On: 03/23/2020 22:25   CT HEAD WO CONTRAST  Result Date: 03/24/2020 CLINICAL DATA:  Subdural hematoma follow-up EXAM: CT HEAD WITHOUT CONTRAST TECHNIQUE: Contiguous axial images were obtained from the base of the skull through the vertex without intravenous contrast. COMPARISON:  Head CT 03/23/2020 FINDINGS: Brain: Unchanged small parafalcine anterior subdural hematoma. No midline shift or other mass effect. Vascular: Atherosclerotic calcification of the vertebral and internal carotid arteries at the skull base. Skull: Left parietal scalp hematoma.  No skull fracture. Sinuses/Orbits: No acute finding. Other: None. IMPRESSION: 1. Unchanged small parafalcine anterior subdural hematoma. 2. Left parietal scalp hematoma without skull fracture. Electronically Signed   By: Ulyses Jarred M.D.   On: 03/24/2020 06:11   CT Head Wo Contrast  Result Date: 03/23/2020 CLINICAL DATA:  Status post trauma. EXAM: CT HEAD WITHOUT CONTRAST TECHNIQUE: Contiguous axial images were obtained from the base of the skull  through the vertex without intravenous contrast. COMPARISON:  None. FINDINGS:  Brain: There is mild cerebral atrophy with widening of the extra-axial spaces and ventricular dilatation. There are areas of decreased attenuation within the white matter tracts of the supratentorial brain, consistent with microvascular disease changes. A small, approximately 2 mm thick acute subdural hemorrhage is seen within the parasagittal region of the anterior interhemispheric fissure on the left (axial CT image 25). This measures approximately 1.8 cm in length. No significant mass effect or midline shift is seen. Vascular: No hyperdense vessel or unexpected calcification. Skull: Normal. Negative for fracture or focal lesion. Sinuses/Orbits: No acute finding. Other: Mild left occipital scalp soft tissue swelling is seen. IMPRESSION: 1. Small, approximately 2 mm thick acute subdural hemorrhage within the parasagittal region of the anterior interhemispheric fissure on the left. MRI correlation is recommended. 2. Mild left occipital scalp soft tissue swelling. Electronically Signed   By: Virgina Norfolk M.D.   On: 03/23/2020 22:42   CT Cervical Spine Wo Contrast  Result Date: 03/23/2020 CLINICAL DATA:  Status post trauma. EXAM: CT CERVICAL SPINE WITHOUT CONTRAST TECHNIQUE: Multidetector CT imaging of the cervical spine was performed without intravenous contrast. Multiplanar CT image reconstructions were also generated. COMPARISON:  None. FINDINGS: Alignment: Normal. Skull base and vertebrae: No acute fracture. No primary bone lesion or focal pathologic process. Soft tissues and spinal canal: No prevertebral fluid or swelling. No visible canal hematoma. Disc levels: Very mild anterior osteophyte formation is seen at the levels of C5-C6 and C6-C7. Mild multilevel intervertebral disc space narrowing is also seen. Moderate severity bilateral multilevel facet joint hypertrophy is noted. Upper chest: Negative. Other: None. IMPRESSION: 1.  No acute fracture or subluxation of the cervical spine. 2. Mild multilevel degenerative disc disease and facet joint hypertrophy. Electronically Signed   By: Virgina Norfolk M.D.   On: 03/23/2020 22:46   MR BRAIN WO CONTRAST  Result Date: 03/24/2020 CLINICAL DATA:  Syncope EXAM: MRI HEAD WITHOUT CONTRAST TECHNIQUE: Multiplanar, multiecho pulse sequences of the brain and surrounding structures were obtained without intravenous contrast. COMPARISON:  Head CT 03/23/2020 FINDINGS: Brain: Small amount of subdural blood along the anterior falx cerebri. Multifocal white matter hyperintensity, most commonly due to chronic ischemic microangiopathy. Normal volume of CSF spaces. No chronic microhemorrhage. Normal midline structures. Vascular: Normal flow voids. Skull and upper cervical spine: Left posterior scalp hematoma. Sinuses/Orbits: Left mastoid fluid. Other: None. IMPRESSION: 1. Small amount of subdural blood along the anterior falx cerebri. No mass effect or midline shift. 2. Left posterior scalp hematoma. 3. Left mastoid fluid. Electronically Signed   By: Ulyses Jarred M.D.   On: 03/24/2020 04:23   DG Pelvis Portable  Result Date: 03/23/2020 CLINICAL DATA:  Trauma, fall EXAM: PORTABLE PELVIS 1-2 VIEWS COMPARISON:  None. FINDINGS: There is no evidence of pelvic fracture or diastasis. No pelvic bone lesions are seen. Degenerative changes of the right greater than left hip. IMPRESSION: No acute osseous abnormality Electronically Signed   By: Donavan Foil M.D.   On: 03/23/2020 22:24   ECHOCARDIOGRAM COMPLETE  Result Date: 03/24/2020    ECHOCARDIOGRAM REPORT   Patient Name:   RUFUS CYPERT Date of Exam: 03/24/2020 Medical Rec #:  [id number]     Height:       65.0 in Accession #:    [id number]    Weight:       244.0 lb Date of Birth:  16-Aug-1941     BSA:          2.153 m Patient Age:    34 years  BP:           100/40 mmHg Patient Gender: F             HR:           93 bpm. Exam Location:  Inpatient Procedure:  2D Echo, Cardiac Doppler and Color Doppler Indications:    R55 Syncope  History:        Patient has no prior history of Echocardiogram examinations.                 Pulmonary HTN, Arrythmias:Atrial Fibrillation; Risk                 Factors:Hypertension and Diabetes. CKD.  Sonographer:    Jonelle Sidle Dance Referring Phys: [place]  1. Left ventricular ejection fraction, by estimation, is 55 to 60%. The left ventricle has normal function. The left ventricle has no regional wall motion abnormalities. There is moderate left ventricular hypertrophy of the posterior segment. Left ventricular diastolic parameters are indeterminate.  2. Right ventricular systolic function is moderately reduced. The right ventricular size is normal. There is moderately elevated pulmonary artery systolic pressure. The estimated right ventricular systolic pressure is 82.7 mmHg.  3. Left atrial size was severely dilated.  4. The mitral valve is normal in structure. Mild mitral valve regurgitation. No evidence of mitral stenosis.  5. The aortic valve is tricuspid. Aortic valve regurgitation is not visualized. Mild aortic valve sclerosis is present, with no evidence of aortic valve stenosis.  6. Aortic dilatation noted. There is mild dilatation of the ascending aorta measuring 38 mm.  7. The inferior vena cava is dilated in size with >50% respiratory variability, suggesting right atrial pressure of 8 mmHg. FINDINGS  Left Ventricle: Left ventricular ejection fraction, by estimation, is 55 to 60%. The left ventricle has normal function. The left ventricle has no regional wall motion abnormalities. The left ventricular internal cavity size was normal in size. There is  moderate left ventricular hypertrophy of the posterior segment. Left ventricular diastolic parameters are indeterminate. Right Ventricle: The right ventricular size is normal. No increase in right ventricular wall thickness. Right ventricular systolic function is  moderately reduced. There is moderately elevated pulmonary artery systolic pressure. The tricuspid regurgitant velocity is 2.85 m/s, and with an assumed right atrial pressure of 8 mmHg, the estimated right ventricular systolic pressure is 07.8 mmHg. Left Atrium: Left atrial size was severely dilated. Right Atrium: Right atrial size was normal in size. Pericardium: There is no evidence of pericardial effusion. Mitral Valve: The mitral valve is normal in structure. Normal mobility of the mitral valve leaflets. Moderate mitral annular calcification. Mild mitral valve regurgitation. No evidence of mitral valve stenosis. Tricuspid Valve: The tricuspid valve is normal in structure. Tricuspid valve regurgitation is trivial. No evidence of tricuspid stenosis. Aortic Valve: The aortic valve is tricuspid. Aortic valve regurgitation is not visualized. Mild aortic valve sclerosis is present, with no evidence of aortic valve stenosis. Aortic valve mean gradient measures 7.0 mmHg. Aortic valve peak gradient measures 11.0 mmHg. Aortic valve area, by VTI measures 1.28 cm. Pulmonic Valve: The pulmonic valve was normal in structure. Pulmonic valve regurgitation is trivial. No evidence of pulmonic stenosis. Aorta: Aortic dilatation noted. There is mild dilatation of the ascending aorta measuring 38 mm. Venous: The inferior vena cava is dilated in size with greater than 50% respiratory variability, suggesting right atrial pressure of 8 mmHg. IAS/Shunts: No atrial level shunt detected by color flow Doppler.  LEFT VENTRICLE  PLAX 2D LVIDd:         5.00 cm LVIDs:         3.40 cm LV PW:         1.40 cm LV IVS:        1.10 cm LVOT diam:     2.00 cm LV SV:         54 LV SV Index:   25 LVOT Area:     3.14 cm  RIGHT VENTRICLE          IVC RV Basal diam:  2.80 cm  IVC diam: 2.70 cm TAPSE (M-mode): 1.2 cm LEFT ATRIUM              Index       RIGHT ATRIUM           Index LA diam:        5.10 cm  2.37 cm/m  RA Area:     19.70 cm LA Vol (A2C):    101.0 ml 46.92 ml/m RA Volume:   47.90 ml  22.25 ml/m LA Vol (A4C):   112.0 ml 52.03 ml/m LA Biplane Vol: 107.0 ml 49.71 ml/m  AORTIC VALVE AV Area (Vmax):    1.46 cm AV Area (Vmean):   1.34 cm AV Area (VTI):     1.28 cm AV Vmax:           166.00 cm/s AV Vmean:          126.000 cm/s AV VTI:            0.421 m AV Peak Grad:      11.0 mmHg AV Mean Grad:      7.0 mmHg LVOT Vmax:         77.20 cm/s LVOT Vmean:        53.800 cm/s LVOT VTI:          0.172 m LVOT/AV VTI ratio: 0.41  AORTA Ao Root diam: 4.00 cm Ao Asc diam:  3.80 cm MITRAL VALVE                TRICUSPID VALVE MV Area (PHT): 3.85 cm     TR Peak grad:   32.5 mmHg MV Decel Time: 197 msec     TR Vmax:        285.00 cm/s MV E velocity: 153.00 cm/s MV A velocity: 72.60 cm/s   SHUNTS MV E/A ratio:  2.11         Systemic VTI:  0.17 m                             Systemic Diam: 2.00 cm Fransico Him MD Electronically signed by Fransico Him MD Signature Date/Time: 03/24/2020/1:28:25 PM    Final     Procedures Procedures (including critical care time)  Medications Ordered in ED Medications  acetaminophen (TYLENOL) tablet 650 mg (has no administration in time range)    Or  acetaminophen (TYLENOL) suppository 650 mg (has no administration in time range)  oxyCODONE (Oxy IR/ROXICODONE) immediate release tablet 5 mg (5 mg Oral Given 03/24/20 0457)  senna-docusate (Senokot-S) tablet 1 tablet (has no administration in time range)  promethazine (PHENERGAN) tablet 12.5 mg (has no administration in time range)  ipratropium-albuterol (DUONEB) 0.5-2.5 (3) MG/3ML nebulizer solution 3 mL (has no administration in time range)  insulin aspart (novoLOG) injection 0-15 Units (0 Units Subcutaneous Not Given 03/24/20 1255)  sodium chloride 0.9 % bolus 500 mL (0  mLs Intravenous Stopped 03/24/20 8099)    ED Course  Casha Estupinan is a 79 y.o. female with PMHx listed that presents to the Emergency Department complaint of Fall    ED Course: Initial exam completed.    Well-appearing hemodynamically stable.  Nontoxic and afebrile.  Physical exam significant for age-appropriate 79 year old female with posterior scalp wound, no cervical midline tenderness, and no focal neurologic deficits on examination.  Initial differential includes syncope, arrhythmia, hypoxia, traumatic ICH, and fracture, malalignment of the spine as well as other traumatic injury.  CT and XR ordered along with basic labs.   CBC without evidence of leukocytosis or leukopenia and stable hemoglobin although anemic 10.2.  BMP with no acute electrolyte imbalance requiring urgent intervention.  Mildly elevated creatinine/BUN. INR 3.  CT head with a small approximately 2 mm thick acute subdural hemorrhage.  CT cervical spine without acute fracture or subluxation.XR chest/pelvis without acute fracture/malalignment.  Spoke with neurosurgery, Dr. Reatha Armour, who did not recommend reversal at this time.  Clinical observation and repeat CT head which he will order.  Small abrasion on the posterior scalp repaired with glue.  Hemostatic.  Diagnostics Vital Signs: reviewed Labs: reviewed and significant findings discussed above Imaging: personally reviewed images interpreted by radiology EKG: reviewed Records: nursing notes along with previous records reviewed and pertinent data discussed   Consults:  Neurosurgery Hospitalist   Reevaluation/Disposition:  Upon reevaluation, patients symptoms stable. Needs admission for neuro monitoring overnight and repeat CT head.    All questions answered.  Patient and/or family was understanding and in agreement with today's assessment and plan.   Sherolyn Buba, MD Emergency Medicine, PGY-3   Note: Dragon medical dictation software was used in the creation of this note.   Final Clinical Impression(s) / ED Diagnoses Final diagnoses:  SDH (subdural hematoma) ([place])    Rx / DC Orders ED Discharge Orders    None       Frann Rider, MD 03/24/20 1459      Isla Pence, MD 03/28/20 [phone]

## 2020-03-24 ENCOUNTER — Observation Stay (HOSPITAL_COMMUNITY): Payer: Medicare Other

## 2020-03-24 ENCOUNTER — Encounter (HOSPITAL_COMMUNITY): Payer: Self-pay | Admitting: Family Medicine

## 2020-03-24 ENCOUNTER — Observation Stay (HOSPITAL_BASED_OUTPATIENT_CLINIC_OR_DEPARTMENT_OTHER): Payer: Medicare Other

## 2020-03-24 DIAGNOSIS — Z794 Long term (current) use of insulin: Secondary | ICD-10-CM

## 2020-03-24 DIAGNOSIS — E1122 Type 2 diabetes mellitus with diabetic chronic kidney disease: Secondary | ICD-10-CM

## 2020-03-24 DIAGNOSIS — R55 Syncope and collapse: Secondary | ICD-10-CM | POA: Diagnosis not present

## 2020-03-24 DIAGNOSIS — I340 Nonrheumatic mitral (valve) insufficiency: Secondary | ICD-10-CM | POA: Diagnosis not present

## 2020-03-24 DIAGNOSIS — S065X9A Traumatic subdural hemorrhage with loss of consciousness of unspecified duration, initial encounter: Secondary | ICD-10-CM

## 2020-03-24 DIAGNOSIS — N189 Chronic kidney disease, unspecified: Secondary | ICD-10-CM | POA: Diagnosis not present

## 2020-03-24 DIAGNOSIS — I483 Typical atrial flutter: Secondary | ICD-10-CM | POA: Diagnosis not present

## 2020-03-24 DIAGNOSIS — I272 Pulmonary hypertension, unspecified: Secondary | ICD-10-CM | POA: Diagnosis not present

## 2020-03-24 DIAGNOSIS — S065X0A Traumatic subdural hemorrhage without loss of consciousness, initial encounter: Secondary | ICD-10-CM | POA: Diagnosis not present

## 2020-03-24 DIAGNOSIS — I482 Chronic atrial fibrillation, unspecified: Secondary | ICD-10-CM

## 2020-03-24 DIAGNOSIS — S065XAA Traumatic subdural hemorrhage with loss of consciousness status unknown, initial encounter: Secondary | ICD-10-CM

## 2020-03-24 DIAGNOSIS — S0001XA Abrasion of scalp, initial encounter: Secondary | ICD-10-CM | POA: Diagnosis not present

## 2020-03-24 DIAGNOSIS — E119 Type 2 diabetes mellitus without complications: Secondary | ICD-10-CM

## 2020-03-24 DIAGNOSIS — I129 Hypertensive chronic kidney disease with stage 1 through stage 4 chronic kidney disease, or unspecified chronic kidney disease: Secondary | ICD-10-CM | POA: Diagnosis not present

## 2020-03-24 DIAGNOSIS — N184 Chronic kidney disease, stage 4 (severe): Secondary | ICD-10-CM

## 2020-03-24 HISTORY — DX: Traumatic subdural hemorrhage with loss of consciousness of unspecified duration, initial encounter: S06.5X9A

## 2020-03-24 HISTORY — DX: Traumatic subdural hemorrhage with loss of consciousness status unknown, initial encounter: S06.5XAA

## 2020-03-24 LAB — GLUCOSE, CAPILLARY
Glucose-Capillary: 202 mg/dL — ABNORMAL HIGH (ref 70–99)
Glucose-Capillary: 110 mg/dL — ABNORMAL HIGH (ref 70–99)
Glucose-Capillary: 130 mg/dL — ABNORMAL HIGH (ref 70–99)

## 2020-03-24 LAB — CBC
HCT: 33.6 % — ABNORMAL LOW (ref 36.0–46.0)
Hemoglobin: 9.9 g/dL — ABNORMAL LOW (ref 12.0–15.0)
MCH: 28.4 pg (ref 26.0–34.0)
MCHC: 29.5 g/dL — ABNORMAL LOW (ref 30.0–36.0)
MCV: 96.6 fL (ref 80.0–100.0)
Platelets: 201 10*3/uL (ref 150–400)
RBC: 3.48 MIL/uL — ABNORMAL LOW (ref 3.87–5.11)
RDW: 17.6 % — ABNORMAL HIGH (ref 11.5–15.5)
WBC: 6.5 10*3/uL (ref 4.0–10.5)
nRBC: 0 % (ref 0.0–0.2)

## 2020-03-24 LAB — ECHOCARDIOGRAM COMPLETE
AR max vel: 1.46 cm2
AV Area VTI: 1.28 cm2
AV Area mean vel: 1.34 cm2
AV Mean grad: 7 mmHg
AV Peak grad: 11 mmHg
Ao pk vel: 1.66 m/s
Area-P 1/2: 3.85 cm2
Height: 65 in
S' Lateral: 3.4 cm
Weight: 3904 oz

## 2020-03-24 LAB — BASIC METABOLIC PANEL
Anion gap: 10 (ref 5–15)
BUN: 77 mg/dL — ABNORMAL HIGH (ref 8–23)
CO2: 23 mmol/L (ref 22–32)
Calcium: 8.9 mg/dL (ref 8.9–10.3)
Chloride: 106 mmol/L (ref 98–111)
Creatinine, Ser: 2.44 mg/dL — ABNORMAL HIGH (ref 0.44–1.00)
GFR calc Af Amer: 21 mL/min — ABNORMAL LOW (ref >60)
GFR calc non Af Amer: 18 mL/min — ABNORMAL LOW (ref >60)
Glucose, Bld: 225 mg/dL — ABNORMAL HIGH (ref 70–99)
Potassium: 4.1 mmol/L (ref 3.5–5.1)
Sodium: 139 mmol/L (ref 135–145)

## 2020-03-24 LAB — TROPONIN I (HIGH SENSITIVITY)
Troponin I (High Sensitivity): 30 ng/L — ABNORMAL HIGH (ref <18)
Troponin I (High Sensitivity): 34 ng/L — ABNORMAL HIGH (ref <18)

## 2020-03-24 LAB — HEMOGLOBIN A1C
Hgb A1c MFr Bld: 7.9 % — ABNORMAL HIGH (ref 4.8–5.6)
Mean Plasma Glucose: 180.03 mg/dL

## 2020-03-24 LAB — SARS CORONAVIRUS 2 BY RT PCR (HOSPITAL ORDER, PERFORMED IN ~~LOC~~ HOSPITAL LAB): SARS Coronavirus 2: NEGATIVE

## 2020-03-24 MED ORDER — PROMETHAZINE HCL 25 MG PO TABS: 12.5000 mg | ORAL_TABLET | Freq: Four times a day (QID) | ORAL | Status: DC | PRN

## 2020-03-24 MED ORDER — ACETAMINOPHEN 650 MG RE SUPP: 650.0000 mg | Freq: Four times a day (QID) | RECTAL | Status: DC | PRN

## 2020-03-24 MED ORDER — SENNOSIDES-DOCUSATE SODIUM 8.6-50 MG PO TABS: 1.0000 | ORAL_TABLET | Freq: Every evening | ORAL | Status: DC | PRN

## 2020-03-24 MED ORDER — INSULIN ASPART 100 UNIT/ML ~~LOC~~ SOLN
0.0000 [IU] | Freq: Three times a day (TID) | SUBCUTANEOUS | Status: DC
  Administered 2020-03-24: 5 [IU] via SUBCUTANEOUS
  Administered 2020-03-24: 2 [IU] via SUBCUTANEOUS

## 2020-03-24 MED ORDER — ACETAMINOPHEN 325 MG PO TABS: 650.0000 mg | ORAL_TABLET | Freq: Four times a day (QID) | ORAL | Status: DC | PRN

## 2020-03-24 MED ORDER — IPRATROPIUM-ALBUTEROL 0.5-2.5 (3) MG/3ML IN SOLN: 3.0000 mL | Freq: Four times a day (QID) | RESPIRATORY_TRACT | Status: DC | PRN

## 2020-03-24 MED ORDER — OXYCODONE HCL 5 MG PO TABS
5.0000 mg | ORAL_TABLET | ORAL | Status: DC | PRN
  Administered 2020-03-24: 5 mg via ORAL
  Filled 2020-03-24: qty 1

## 2020-03-24 NOTE — ED Notes (Signed)
Pt to MRI

## 2020-03-24 NOTE — Discharge Instructions (Signed)
Head Injury, Adult There are many types of head injuries. They can be as minor as a bump. Some head injuries can be worse. Worse injuries include:  A strong hit to the head that shakes the brain back and forth causing damage (concussion).  A bruise (contusion) of the brain. This means there is bleeding in the brain that can cause swelling.  A cracked skull (skull fracture).  Bleeding in the brain that gathers, gets thick (makes a clot), and forms a bump (hematoma). Most problems from a head injury come in the first 24 hours. However, you may still have side effects up to 7-10 days after your injury. It is important to watch your condition for any changes. You may need to be watched in the emergency department or urgent care, or you may need to stay in the hospital. What are the causes? There are many possible causes of a head injury. A serious head injury may be caused by:  A car accident.  Bicycle or motorcycle accidents.  Sports injuries.  Falls. What are the signs or symptoms? Symptoms of a head injury include a bruise, bump, or bleeding where the injury happened. Other physical symptoms may include:  Headache.  Feeling sick to your stomach (nauseous) or vomiting.  Dizziness.  Feeling tired.  Being uncomfortable around bright lights or loud noises.  Shaking movements that you cannot control (seizures).  Trouble being woken up.  Passing out (fainting). Mental or emotional symptoms may include:  Feeling grumpy or cranky.  Confusion and memory problems.  Having trouble paying attention or concentrating.  Changes in eating or sleeping habits.  Feeling worried or nervous (anxious).  Feeling sad (depressed). How is this treated? Treatment for this condition depends on how severe the injury is and the type of injury you have. The main goal is to prevent complications and to allow the brain time to heal. Mild head injury If you have a mild head injury, you may be  sent home and treatment may include:  Being watched. A responsible adult should stay with you for 24 hours after your injury and check on you often.  Physical rest.  Brain rest.  Pain medicines. Severe head injury If you have a severe head injury, treatment may include:  Being watched closely. This includes hospitalization with frequent physical exams.  Medicines to: ? Help with pain. ? Prevent shaking movements that you cannot control. ? Help with brain swelling.  Using a machine that helps you breathe (ventilator).  Treatments to manage the swelling inside the brain.  Brain surgery. This may be needed to: ? Remove a blood clot. ? Stop the bleeding. ? Remove a part of the skull. This allows room for the brain to swell. Follow these instructions at home: Activity  Rest.  Avoid activities that are hard or tiring.  Make sure you get enough sleep.  Limit activities that need a lot of thought or attention, such as: ? Watching TV. ? Playing memory games and puzzles. ? Job-related work or homework. ? Working on [employer], and texting.  Avoid activities that could cause another head injury until your doctor says it is okay. This includes playing sports. Having another head injury, especially before the first one has healed, can be dangerous.  Ask your doctor when it is safe for you to go back to your normal activities, such as work or school. Ask your doctor for a step-by-step plan for slowly going back to your normal activities.  Ask  your doctor when you can drive, ride a bicycle, or use heavy machinery. Do not do these activities if you are dizzy. Lifestyle   Do not drink alcohol until your doctor says it is okay.  Do not use drugs.  If it is harder than usual to remember things, write them down.  If you are easily distracted, try to do one thing at a time.  Talk with family members or close friends when making important decisions.  Tell your  friends, family, a trusted coworker, and work [employer] about your injury, symptoms, and limits (restrictions). Have them watch for any problems that are new or getting worse. General instructions  Take over-the-counter and prescription medicines only as told by your doctor.  Have someone stay with you for 24 hours after your head injury. This person should watch you for any changes in your symptoms and be ready to get help.  Keep all follow-up visits as told by your doctor. This is important. How is this prevented?  Work on [employer]. This can help you avoid falls.  Wear a seatbelt when you are in a moving vehicle.  Wear a helmet when you: ? Ride a bicycle. ? Ski. ? Do any other sport or activity that has a risk of injury.  If you drink alcohol: ? Limit how much you use to:  0-1 drink a day for women.  0-2 drinks a day for men. ? Be aware of how much alcohol is in your drink. In the [place] one drink equals one 12 oz bottle of beer (355 mL), one 5 oz glass of wine (148 mL), or one 1 oz glass of hard liquor (44 mL).  Make your home safer by: ? Getting rid of clutter from the floors and stairs. This includes things that can make you trip. ? Using grab bars in bathrooms and handrails by stairs. ? Placing non-slip mats on floors and in bathtubs. ? Putting more light in dim areas. Get help right away if:  You have: ? A very bad headache that is not helped by medicine. ? Trouble walking or weakness in your arms and legs. ? Clear or bloody fluid coming from your nose or ears. ? Changes in how you see (vision). ? Shaking movements that you cannot control.  You lose your balance.  You vomit.  The black centers of your eyes (pupils) change in size.  Your speech is slurred.  Your dizziness gets worse.  You pass out.  You are sleepier than normal and have trouble staying awake.  Your symptoms get worse. These symptoms may be an emergency. Do not wait to see  if the symptoms will go away. Get medical help right away. Call your local emergency services (911 in the [place] Do not drive yourself to the hospital. Summary  There are many types of head injuries. They can be as minor as a bump. Some head injuries can be worse  Treatment for this condition depends on how severe the injury is and the type of injury you have.  Ask your doctor when it is safe for you to go back to your normal activities, such as work or school.  To prevent a head injury, wear a seat belt in a car, wear a helmet when you use a a bicycle, limit your alcohol use, and make your home safer. This information is not intended to replace advice given to you by your health care provider. Make sure you discuss any questions you  have with your health care provider. Document Revised: 11/25/2018 Document Reviewed: 08/27/2018 Elsevier Patient Education  2020 [place].   Subdural Hematoma  A subdural hematoma is a collection of blood between the brain and its outer covering (dura). As the amount of blood increases, pressure builds on the brain. There are two types of subdural hematomas:  Acute. This type develops shortly after a hard, direct hit to the head and causes blood to collect very quickly. This is a medical emergency. If it is not diagnosed and treated quickly, it can lead to severe brain injury or death.  Chronic. This is when bleeding develops more slowly, over weeks or months. In some cases, this type does not cause symptoms. What are the causes? This condition is caused by bleeding (hemorrhage) from a broken (ruptured) blood vessel. In most cases, a blood vessel ruptures and bleeds because of a head injury, such as from a hard, direct hit. Head injuries can happen in car accidents, falls, assaults, or while playing sports. In rare cases, a hemorrhage can happen without a known cause (spontaneously), especially if you take blood thinners (anticoagulants). What increases  the risk? This condition is more likely to develop in:  Older people.  Infants.  People who take blood thinners.  People who have head injuries.  People who abuse alcohol. What are the signs or symptoms? Symptoms of this condition can vary depending on the size of the hematoma. Symptoms can be mild, severe, or life-threatening. They include:  Headaches.  Nausea or vomiting.  Changes in vision, such as double vision or loss of vision.  Changes in speech or trouble understanding what people say.  Loss of balance or trouble walking.  Weakness, numbness, or tingling in the arms or legs, especially on one side of the body.  Seizures.  Change in personality.  Increased sleepiness.  Memory loss.  Loss of consciousness.  Coma. Symptoms of acute subdural hematoma can develop over minutes or hours. Symptoms of chronic subdural hematoma may develop over weeks or months. How is this diagnosed? This condition is diagnosed based on the results of:  A physical exam.  Tests of strength, reflexes, coordination, senses, manner of walking (gait), and facial and eye movements (neurological exam).  Imaging tests, such as an MRI or a CT scan. How is this treated? Treatment for this condition depends on the type of hematoma and how severe it is. Treatment for acute hematoma may include:  Emergency surgery to drain blood or remove a blood clot.  Medicines that help the body get rid of excess fluids (diuretics). These may help to reduce pressure in the brain.  Assisted breathing (ventilation). Treatment for chronic hematoma may include:  Observation and bed rest at the hospital.  Surgery. If you take blood thinners, you may need to stop taking them for a short time. You may also be given anti-seizure (anticonvulsant) medicine. Sometimes, no treatment is needed for chronic subdural hematoma. Follow these instructions at home: Activity  Avoid situations where you could injure  your head again, such as in competitive sports, downhill snow sports, and horseback riding. Do not do these activities until your health care provider approves. ? Wear protective gear, such as a helmet, when participating in activities such as biking or contact sports.  Avoid too much visual stimulation while recovering. This means limiting how much you read and limiting your screen time on a smart phone, tablet, computer, or TV.  Rest as told by your health care provider. Rest helps  the brain heal.  Try to avoid activities that cause physical or mental stress. Return to work or school as told by your health care provider.  Do not lift anything that is heavier than 5 lb (2.3 kg), or the limit you are told, until your health care provider says that it is safe.  Do not drive, ride a bike, or use heavy machinery until your health care provider approves.  Always wear your seat belt when you are in a motor vehicle. Alcohol use  Do not drink alcohol if your health care provider tells you not to drink.  If you drink alcohol, limit how much you use to: ? 0-1 drink a day for women. ? 0-2 drinks a day for men. General instructions  Monitor your symptoms, and ask people around you to do the same. Recovery from brain injuries varies. Talk with your health care provider about what to expect.  Take over-the-counter and prescription medicines only as told by your health care provider. Do not take blood thinners or NSAIDs unless your health care provider approves. These include aspirin, ibuprofen, naproxen, and warfarin.  Keep your home environment safe to reduce the risk of falling.  Keep all follow-up visits as told by your health care provider. This is important. Where to find more information  [employer] of Neurological Disorders and Stroke: MasterBoxes.it  American Academy of Neurology (AAN): http://keith.biz/  Brain [employer] Association of [place]: www.biausa.org Get help right away if  you:  Are taking blood thinners and you fall or you experience minor trauma to the head. If you take any blood thinners, even a very small injury can cause a subdural hematoma.  Have a bleeding disorder and you fall or you experience minor trauma to the head.  Develop any of the following symptoms after a head injury: ? Clear fluid draining from your nose or ears. ? Nausea or vomiting. ? Changes in speech or trouble understanding what people say. ? Seizures. ? Drowsiness or a decrease in alertness. ? Double vision. ? Numbness or inability to move (paralysis) in any part of your body. ? Difficulty walking or poor coordination. ? Difficulty thinking. ? Confusion or forgetfulness. ? Personality changes. ? Irrational or aggressive behavior. These symptoms may represent a serious problem that is an emergency. Do not wait to see if the symptoms will go away. Get medical help right away. Call your local emergency services (911 in the [place] Do not drive yourself to the hospital. Summary  A subdural hematoma is a collection of blood between the brain and its outer covering (dura).  Treatment for this condition depends on what type of subdural hematoma you have and how severe it is.  Symptoms can vary from mild to severe to life-threatening.  Monitor your symptoms, and ask others around you to do the same. This information is not intended to replace advice given to you by your health care provider. Make sure you discuss any questions you have with your health care provider. Document Revised: 07/05/2018 Document Reviewed: 07/05/2018 Elsevier Patient Education  2020 [employer].

## 2020-03-24 NOTE — Progress Notes (Signed)
Pt has been discharged from the unit. All IV and tele has been removed and discontinued. Pt reports no pain. AVS documentation has been given and reviewed. Pt sent with all belongings. Pt sent in good spirits.

## 2020-03-24 NOTE — Discharge Summary (Addendum)
Physician Discharge Summary  Ann Flynn QMV:[id number] DOB: November 22, 1940 DOA: 03/23/2020  PCP: Trinna Post, PA-C  Admit date: 03/23/2020 Discharge date: 03/24/2020  Time spent: 50 minutes  Recommendations for Outpatient Follow-up:  1. Follow-up PCP in 2 weeks 2. Follow-up neurosurgery in 10 days. 3. Hold Coumadin for 10 days, will need CT brain in 10 days and follow-up with neurosurgery before starting Coumadin.  Discharge Diagnoses:  Principal Problem:   Syncope Active Problems:   Atrial fibrillation, chronic (HCC)   Type 2 diabetes mellitus with stage 4 chronic kidney disease ([place])   Pulmonary hypertension (HCC)   Subdural hematoma, acute ([place])   Discharge Condition: Stable  Diet recommendation: Heart healthy diet  Filed Weights   03/23/20 2200 03/24/20 0445  Weight: 106.6 kg 110.7 kg    History of present illness:  79 year old female with a history of pulmonary arterial hypertension on chronic home O2 3 L/min via nasal cannula, OSA on CPAP, PAD with prior carotid surgery in 2010, DVT/PE in 2009 on Coumadin since that time, diabetes mellitus type 2, CKD stage IV who was admitted with diagnosis of syncope.  Patient went outside to walk and did not realize an oxygen tank had run out of oxygen.  She began to feel dizzy and lightheaded and her neighbor tried to get her back home.  While she was walking back to the house with the neighbor she had a syncopal episode and hit the back of her head to the ground.  No reported specific activity.  Patient is on chronic Coumadin for chronic A. fib as well as history of DVT.  CT head showed small 2 mm subdural hematoma.  Neurosurgery was consulted.  Hospital Course:  Small subdural hematoma-patient had 2 mm subdural hematoma sustained after she fell and hit her head.  Neurosurgery was consulted, recommended no surgical intervention at this time.  Recommended to hold Coumadin for 10 days.  She will follow-up with neurosurgeon in 10 days with  repeat CT head and at that time she can resume her Coumadin.  Syncope-patient was found to have hypotension in the ED, orthostatic vital signs are negative for portal hypertension.  Syncope is thought to be due to hypoxia while she was walking on the road as her oxygen tank was empty.  She follows Dr. [name] as outpatient.  Cardiology was consulted, and cleared her for discharge.  Echocardiogram was obtained, echocardiogram showed EF 55 to 60%, moderate left ventricular hypertrophy, moderately elevated pulmonary artery systolic pressure.  Cardiology recommends no change in treatment.  We will continue with the same medications as patient taking at home.  Orthostatic vital signs were negative for orthostatic hypotension.  Chronic diastolic CHF-appears euvolemic, restart home medication including torsemide as per cardiology.  Nonobstructive CAD-no Coumadin due to subdural hematoma.  Continue metoprolol  Diabetes mellitus type 2-continue home regimen  Procedures:  Echocardiogram  Consultations:  Neurosurgery  Cardiology  Discharge Exam: Vitals:   03/24/20 1100 03/24/20 1534  BP: (!) 127/47 112/67  Pulse: 65 (!) 53  Resp: 18 18  Temp: 97.7 F (36.5 C) 98.2 F (36.8 C)  SpO2: 98% 100%    General: Appears in no acute distress Cardiovascular: S1-S2, regular Respiratory: Clear to auscultation bilaterally  Discharge Instructions   Discharge Instructions    Diet - low sodium heart healthy   Complete by: As directed    Discharge instructions   Complete by: As directed    Hold Coumadin for 10 days.  Restart Coumadin only after discussion with neurosurgery  as outpatient in 10 days.   Increase activity slowly   Complete by: As directed      Allergies as of 03/24/2020      Reactions   Penicillins Swelling   Sulfa Antibiotics Rash      Medication List    STOP taking these medications   amLODipine 5 MG tablet Commonly known as: NORVASC   warfarin 3 MG tablet Commonly  known as: COUMADIN   warfarin 4 MG tablet Commonly known as: COUMADIN     TAKE these medications   ambrisentan 5 MG tablet Commonly known as: LETAIRIS Take 5 mg by mouth at bedtime.   atorvastatin 80 MG tablet Commonly known as: LIPITOR Take 80 mg by mouth daily.   FeroSul 325 (65 FE) MG tablet Generic drug: ferrous sulfate Take 325 mg by mouth 3 (three) times daily.   Januvia 25 MG tablet Generic drug: sitaGLIPtin Take 25 mg by mouth daily.   losartan 100 MG tablet Commonly known as: COZAAR Take 100 mg by mouth daily.   metoprolol tartrate 25 MG tablet Commonly known as: LOPRESSOR Take 25 mg by mouth 2 (two) times daily.   omeprazole 40 MG capsule Commonly known as: PRILOSEC Take 40 mg by mouth daily.   torsemide 20 MG tablet Commonly known as: DEMADEX Take 40 mg by mouth daily.   Toujeo SoloStar 300 UNIT/ML Solostar Pen Generic drug: insulin glargine (1 Unit Dial) Inject 50 Units into the skin at bedtime.   venlafaxine 75 MG tablet Commonly known as: EFFEXOR Take 75 mg by mouth daily.      Allergies  Allergen Reactions  . Penicillins Swelling  . Sulfa Antibiotics Rash    Follow-up Information    Dawley, Troy C, DO Follow up in 10 day(s).   Why: please call to make an appointment need a CT brain before appointment which will need to be ordered Contact information: [address] [place] [address] [place] [address] [phone]                The results of significant diagnostics from this hospitalization (including imaging, microbiology, ancillary and laboratory) are listed below for reference.    Significant Diagnostic Studies: DG Chest 1 View  Result Date: 03/23/2020 CLINICAL DATA:  Fall EXAM: CHEST  1 VIEW COMPARISON:  07/07/2019 FINDINGS: Right breast prosthesis. Cardiomegaly. No acute consolidation or effusion. Aortic atherosclerosis. Calcified granulomas in the lungs. No pneumothorax. IMPRESSION: No active disease. Cardiomegaly.  Electronically Signed   By: Donavan Foil M.D.   On: 03/23/2020 22:25   CT HEAD WO CONTRAST  Result Date: 03/24/2020 CLINICAL DATA:  Subdural hematoma follow-up EXAM: CT HEAD WITHOUT CONTRAST TECHNIQUE: Contiguous axial images were obtained from the base of the skull through the vertex without intravenous contrast. COMPARISON:  Head CT 03/23/2020 FINDINGS: Brain: Unchanged small parafalcine anterior subdural hematoma. No midline shift or other mass effect. Vascular: Atherosclerotic calcification of the vertebral and internal carotid arteries at the skull base. Skull: Left parietal scalp hematoma.  No skull fracture. Sinuses/Orbits: No acute finding. Other: None. IMPRESSION: 1. Unchanged small parafalcine anterior subdural hematoma. 2. Left parietal scalp hematoma without skull fracture. Electronically Signed   By: Ulyses Jarred M.D.   On: 03/24/2020 06:11   CT Head Wo Contrast  Result Date: 03/23/2020 CLINICAL DATA:  Status post trauma. EXAM: CT HEAD WITHOUT CONTRAST TECHNIQUE: Contiguous axial images were obtained from the base of the skull through the vertex without intravenous contrast. COMPARISON:  None. FINDINGS: Brain: There is mild  cerebral atrophy with widening of the extra-axial spaces and ventricular dilatation. There are areas of decreased attenuation within the white matter tracts of the supratentorial brain, consistent with microvascular disease changes. A small, approximately 2 mm thick acute subdural hemorrhage is seen within the parasagittal region of the anterior interhemispheric fissure on the left (axial CT image 25). This measures approximately 1.8 cm in length. No significant mass effect or midline shift is seen. Vascular: No hyperdense vessel or unexpected calcification. Skull: Normal. Negative for fracture or focal lesion. Sinuses/Orbits: No acute finding. Other: Mild left occipital scalp soft tissue swelling is seen. IMPRESSION: 1. Small, approximately 2 mm thick acute subdural  hemorrhage within the parasagittal region of the anterior interhemispheric fissure on the left. MRI correlation is recommended. 2. Mild left occipital scalp soft tissue swelling. Electronically Signed   By: Virgina Norfolk M.D.   On: 03/23/2020 22:42   CT Cervical Spine Wo Contrast  Result Date: 03/23/2020 CLINICAL DATA:  Status post trauma. EXAM: CT CERVICAL SPINE WITHOUT CONTRAST TECHNIQUE: Multidetector CT imaging of the cervical spine was performed without intravenous contrast. Multiplanar CT image reconstructions were also generated. COMPARISON:  None. FINDINGS: Alignment: Normal. Skull base and vertebrae: No acute fracture. No primary bone lesion or focal pathologic process. Soft tissues and spinal canal: No prevertebral fluid or swelling. No visible canal hematoma. Disc levels: Very mild anterior osteophyte formation is seen at the levels of C5-C6 and C6-C7. Mild multilevel intervertebral disc space narrowing is also seen. Moderate severity bilateral multilevel facet joint hypertrophy is noted. Upper chest: Negative. Other: None. IMPRESSION: 1. No acute fracture or subluxation of the cervical spine. 2. Mild multilevel degenerative disc disease and facet joint hypertrophy. Electronically Signed   By: Virgina Norfolk M.D.   On: 03/23/2020 22:46   MR BRAIN WO CONTRAST  Result Date: 03/24/2020 CLINICAL DATA:  Syncope EXAM: MRI HEAD WITHOUT CONTRAST TECHNIQUE: Multiplanar, multiecho pulse sequences of the brain and surrounding structures were obtained without intravenous contrast. COMPARISON:  Head CT 03/23/2020 FINDINGS: Brain: Small amount of subdural blood along the anterior falx cerebri. Multifocal white matter hyperintensity, most commonly due to chronic ischemic microangiopathy. Normal volume of CSF spaces. No chronic microhemorrhage. Normal midline structures. Vascular: Normal flow voids. Skull and upper cervical spine: Left posterior scalp hematoma. Sinuses/Orbits: Left mastoid fluid. Other:  None. IMPRESSION: 1. Small amount of subdural blood along the anterior falx cerebri. No mass effect or midline shift. 2. Left posterior scalp hematoma. 3. Left mastoid fluid. Electronically Signed   By: Ulyses Jarred M.D.   On: 03/24/2020 04:23   DG Pelvis Portable  Result Date: 03/23/2020 CLINICAL DATA:  Trauma, fall EXAM: PORTABLE PELVIS 1-2 VIEWS COMPARISON:  None. FINDINGS: There is no evidence of pelvic fracture or diastasis. No pelvic bone lesions are seen. Degenerative changes of the right greater than left hip. IMPRESSION: No acute osseous abnormality Electronically Signed   By: Donavan Foil M.D.   On: 03/23/2020 22:24   ECHOCARDIOGRAM COMPLETE  Result Date: 03/24/2020    ECHOCARDIOGRAM REPORT   Patient Name:   Ann Flynn Date of Exam: 03/24/2020 Medical Rec #:  [id number]     Height:       65.0 in Accession #:    [id number]    Weight:       244.0 lb Date of Birth:  [birth year]     BSA:          2.153 m Patient Age:    89 years      BP:  100/40 mmHg Patient Gender: F             HR:           93 bpm. Exam Location:  Inpatient Procedure: 2D Echo, Cardiac Doppler and Color Doppler Indications:    R55 Syncope  History:        Patient has no prior history of Echocardiogram examinations.                 Pulmonary HTN, Arrythmias:Atrial Fibrillation; Risk                 Factors:Hypertension and Diabetes. CKD.  Sonographer:    Jonelle Sidle Dance Referring Phys: [place] [facility]  1. Left ventricular ejection fraction, by estimation, is 55 to 60%. The left ventricle has normal function. The left ventricle has no regional wall motion abnormalities. There is moderate left ventricular hypertrophy of the posterior segment. Left ventricular diastolic parameters are indeterminate.  2. Right ventricular systolic function is moderately reduced. The right ventricular size is normal. There is moderately elevated pulmonary artery systolic pressure. The estimated right ventricular systolic pressure is  14.8 mmHg.  3. Left atrial size was severely dilated.  4. The mitral valve is normal in structure. Mild mitral valve regurgitation. No evidence of mitral stenosis.  5. The aortic valve is tricuspid. Aortic valve regurgitation is not visualized. Mild aortic valve sclerosis is present, with no evidence of aortic valve stenosis.  6. Aortic dilatation noted. There is mild dilatation of the ascending aorta measuring 38 mm.  7. The inferior vena cava is dilated in size with >50% respiratory variability, suggesting right atrial pressure of 8 mmHg. FINDINGS  Left Ventricle: Left ventricular ejection fraction, by estimation, is 55 to 60%. The left ventricle has normal function. The left ventricle has no regional wall motion abnormalities. The left ventricular internal cavity size was normal in size. There is  moderate left ventricular hypertrophy of the posterior segment. Left ventricular diastolic parameters are indeterminate. Right Ventricle: The right ventricular size is normal. No increase in right ventricular wall thickness. Right ventricular systolic function is moderately reduced. There is moderately elevated pulmonary artery systolic pressure. The tricuspid regurgitant velocity is 2.85 m/s, and with an assumed right atrial pressure of 8 mmHg, the estimated right ventricular systolic pressure is 30.7 mmHg. Left Atrium: Left atrial size was severely dilated. Right Atrium: Right atrial size was normal in size. Pericardium: There is no evidence of pericardial effusion. Mitral Valve: The mitral valve is normal in structure. Normal mobility of the mitral valve leaflets. Moderate mitral annular calcification. Mild mitral valve regurgitation. No evidence of mitral valve stenosis. Tricuspid Valve: The tricuspid valve is normal in structure. Tricuspid valve regurgitation is trivial. No evidence of tricuspid stenosis. Aortic Valve: The aortic valve is tricuspid. Aortic valve regurgitation is not visualized. Mild aortic valve  sclerosis is present, with no evidence of aortic valve stenosis. Aortic valve mean gradient measures 7.0 mmHg. Aortic valve peak gradient measures 11.0 mmHg. Aortic valve area, by VTI measures 1.28 cm. Pulmonic Valve: The pulmonic valve was normal in structure. Pulmonic valve regurgitation is trivial. No evidence of pulmonic stenosis. Aorta: Aortic dilatation noted. There is mild dilatation of the ascending aorta measuring 38 mm. Venous: The inferior vena cava is dilated in size with greater than 50% respiratory variability, suggesting right atrial pressure of 8 mmHg. IAS/Shunts: No atrial level shunt detected by color flow Doppler.  LEFT VENTRICLE PLAX 2D LVIDd:  5.00 cm LVIDs:         3.40 cm LV PW:         1.40 cm LV IVS:        1.10 cm LVOT diam:     2.00 cm LV SV:         54 LV SV Index:   25 LVOT Area:     3.14 cm  RIGHT VENTRICLE          IVC RV Basal diam:  2.80 cm  IVC diam: 2.70 cm TAPSE (M-mode): 1.2 cm LEFT ATRIUM              Index       RIGHT ATRIUM           Index LA diam:        5.10 cm  2.37 cm/m  RA Area:     19.70 cm LA Vol (A2C):   101.0 ml 46.92 ml/m RA Volume:   47.90 ml  22.25 ml/m LA Vol (A4C):   112.0 ml 52.03 ml/m LA Biplane Vol: 107.0 ml 49.71 ml/m  AORTIC VALVE AV Area (Vmax):    1.46 cm AV Area (Vmean):   1.34 cm AV Area (VTI):     1.28 cm AV Vmax:           166.00 cm/s AV Vmean:          126.000 cm/s AV VTI:            0.421 m AV Peak Grad:      11.0 mmHg AV Mean Grad:      7.0 mmHg LVOT Vmax:         77.20 cm/s LVOT Vmean:        53.800 cm/s LVOT VTI:          0.172 m LVOT/AV VTI ratio: 0.41  AORTA Ao Root diam: 4.00 cm Ao Asc diam:  3.80 cm MITRAL VALVE                TRICUSPID VALVE MV Area (PHT): 3.85 cm     TR Peak grad:   32.5 mmHg MV Decel Time: 197 msec     TR Vmax:        285.00 cm/s MV E velocity: 153.00 cm/s MV A velocity: 72.60 cm/s   SHUNTS MV E/A ratio:  2.11         Systemic VTI:  0.17 m                             Systemic Diam: 2.00 cm Fransico Him MD  Electronically signed by Fransico Him MD Signature Date/Time: 03/24/2020/1:28:25 PM    Final     Microbiology: Recent Results (from the past 240 hour(s))  SARS Coronavirus 2 by RT PCR (hospital order, performed in [place] hospital lab) Nasopharyngeal Nasopharyngeal Swab     Status: None   Collection Time: 03/23/20 11:30 PM   Specimen: Nasopharyngeal Swab  Result Value Ref Range Status   SARS Coronavirus 2 NEGATIVE NEGATIVE Final    Comment: (NOTE) SARS-CoV-2 target nucleic acids are NOT DETECTED.  The SARS-CoV-2 RNA is generally detectable in upper and lower respiratory specimens during the acute phase of infection. The lowest concentration of SARS-CoV-2 viral copies this assay can detect is 250 copies / mL. A negative result does not preclude SARS-CoV-2 infection and should not be used as the sole basis for treatment or other patient management decisions.  A negative result may occur with improper specimen collection / handling, submission of specimen other than nasopharyngeal swab, presence of viral mutation(s) within the areas targeted by this assay, and inadequate number of viral copies (<250 copies / mL). A negative result must be combined with clinical observations, patient history, and epidemiological information.  Fact Sheet for Patients:   StrictlyIdeas.no  Fact Sheet for Healthcare Providers: BankingDealers.co.za  This test is not yet approved or  cleared by the [place] FDA and has been authorized for detection and/or diagnosis of SARS-CoV-2 by FDA under an Emergency Use Authorization (EUA).  This EUA will remain in effect (meaning this test can be used) for the duration of the COVID-19 declaration under Section 564(b)(1) of the Act, 21 U.S.C. section 360bbb-3(b)(1), unless the authorization is terminated or revoked sooner.  Performed at [facility], [place] [address] [place], [place] [address]       Labs: Basic Metabolic Panel: Recent Labs  Lab 03/23/20 2208 03/24/20 0615  NA 140 139  K 4.3 4.1  CL 103 106  CO2 26 23  GLUCOSE 209* 225*  BUN 81* 77*  CREATININE 2.67* 2.44*  CALCIUM 9.0 8.9   Liver Function Tests: No results for input(s): AST, ALT, ALKPHOS, BILITOT, PROT, ALBUMIN in the last 168 hours. No results for input(s): LIPASE, AMYLASE in the last 168 hours. No results for input(s): AMMONIA in the last 168 hours. CBC: Recent Labs  Lab 03/23/20 2208 03/24/20 0615  WBC 6.8 6.5  NEUTROABS 4.2  --   HGB 10.2* 9.9*  HCT 33.9* 33.6*  MCV 96.0 96.6  PLT 217 201    CBG: Recent Labs  Lab 03/24/20 0628 03/24/20 1227  GLUCAP 202* 110*       Signed:  Oswald Hillock MD.  [facility] 03/24/2020, 4:34 PM

## 2020-03-24 NOTE — Consult Note (Addendum)
Cardiology Consultation:   Patient ID: Etter Royall MRN: [id number]; DOB: 1941-06-30  Admit date: 03/23/2020 Date of Consult: 03/24/2020  Primary Care Provider: Paulene Floor [facility] Cardiologist: Dr. Rockey Situ Dr. [name] [facility] Electrophysiologist:  None    Patient Profile:   Ann Flynn is a 79 y.o. female with a history of pulmonary arterial hypertension on 3L of O2 at home, obstructive sleep apnea on CPAP, PAD with prior carotid surgery in 2010, DVT/PE in 2009 on Coumadin since that time, type 2 diabetes mellitus, and CKD stage IV who is being seen today for the evaluation of syncope at the request of Dr. Darrick Meigs.  History of Present Illness:   [name] is a 79 year old female with the above history. Patient recently moved to [facility] from [place]. She has known history of PAD an underwent carotid artery surgery in 2010. She also had a DVT/PE in 2009 and has been on Coumadin since that time. She was diagnosed with PAH in 2016 at [facility] in [place], [place]. Right/left heart cath at the time showed non-obstructive CAD and severe PAH with PAP of 103/19 (51) and PCWP 6). She was told she had idiopathic PAH and was started on Letairis and Cialis. She was first seen by Dr. Rockey Situ in 06/2019 to establish care who then referred patient to Dr. [name]. Dr. [name] felt like she had primarily WHO Group III pulmonary hypertension. Echo was ordered and showed LVEF of 55-60% with mildly dilated RV but normal function. VQ scan was also ordered and showed no PE. She had repeat right heart cath with Dr. [name] in 07/2019 which showed moderate PAH (much improved from previous cath) with high cardiac output of unclear etiology but no evidence of intracardiac shunt. She was last seen by Dr. [name] in 01/2020 at which time she was doing better after recently being switched from Lasix to Torsemide. She was noted to be in atrial flutter at that visit. Outpatient monitor  was ordered and showed atrial flutter continuously (100% burden) with rates ranging from 50 to 133 (average 85 bpm).  Patient presented to the ED yesterday after syncopal episode and fall. Patient reports she has been in her usual states of health. She she went to dinner at her neighbors house last night and didn't realize her O2 tank was empty. While she was walking home with one of her neighbors, she states she started to feel dizzy, weakness, and like she might pass out. She then briefly passed out and fell hitting the back of her head in the process. No recent chest pain. Breathing has been stable. No orthopnea or PND. Chronic lower extremity edema much improved on Torsemide. No recent fevers or illnesses. She denies any gross hematuria, hematochezia, or melena but states recent stool sample was positive for blood. She saw a GI doctor and was initially planning on having a colonoscopy on 03/22/2020 but this had to be cancelled due to need to hold Coumadin.  In the ED, BP soft at time and patient mildly tachycardic. EKG showed showed atrial flutter, rate 98 bpm, with variable AV block. Head CT showed small approximately 2 mm thick acute subdural hemorrhage within the parasagittal region of the anterior interhemispheric fissure on the left with mild left occipital scalp soft tissue swelling. No acute findings on chest x-ray, pelvic x-ray, CT of cervical spine. Hgb 6.8, Hgb 10.2, Plts 217. Na 140, K 4.3, Glucose 209, BUN 81, Cr 2.67. INR 3.0. COVID-19 negative. Patient admitted for further  evaluation. Neurosurgery was consulted and recommended holding Coumadin for 7-10 days and repeating CT scan at that time. No acute neurosurgical intervention felt to be needed at this time. Cardiology was consulted for further evaluation of syncope.  At the time of this evaluation, patient resting comfortably in no acute distress.  Past Medical History:  Diagnosis Date  . A-fib ([place])   . Anemia   . Chronic kidney  disease   . Diabetes mellitus without complication ([place])   . Hypertension   . Pulmonary hypertension ([place])     Past Surgical History:  Procedure Laterality Date  . ABDOMINAL HYSTERECTOMY    . masectomy       Home Medications:  Prior to Admission medications   Medication Sig Start Date End Date Taking? Authorizing Provider  ambrisentan (LETAIRIS) 5 MG tablet Take 5 mg by mouth at bedtime. 03/07/20  Yes [provider]  amLODipine (NORVASC) 5 MG tablet Take 5 mg by mouth daily. 02/10/20  Yes [provider]  atorvastatin (LIPITOR) 80 MG tablet Take 80 mg by mouth daily. 12/28/19  Yes [provider]  FEROSUL 325 (65 Fe) MG tablet Take 325 mg by mouth 3 (three) times daily. 03/15/20  Yes [provider]  JANUVIA 25 MG tablet Take 25 mg by mouth daily. 03/08/20  Yes [provider]  losartan (COZAAR) 100 MG tablet Take 100 mg by mouth daily. 02/10/20  Yes [provider]  metoprolol tartrate (LOPRESSOR) 25 MG tablet Take 25 mg by mouth 2 (two) times daily. 01/26/20  Yes [provider]  omeprazole (PRILOSEC) 40 MG capsule Take 40 mg by mouth daily. 01/26/20  Yes [provider]  torsemide (DEMADEX) 20 MG tablet Take 40 mg by mouth daily. 01/18/20  Yes [provider]  TOUJEO SOLOSTAR 300 UNIT/ML Solostar Pen Inject 50 Units into the skin at bedtime. 03/06/20  Yes [provider]  venlafaxine (EFFEXOR) 75 MG tablet Take 75 mg by mouth daily. 02/10/20  Yes [provider]  warfarin (COUMADIN) 3 MG tablet Take 3 mg by mouth Every Tuesday,Thursday,and Saturday with dialysis. 11/11/19  Yes [provider]  warfarin (COUMADIN) 4 MG tablet Take 4 mg by mouth See admin instructions. Sunday, Monday, Wednesday and Friday 12/13/19  Yes [provider]    Inpatient Medications: Scheduled Meds: . insulin aspart  0-15 Units Subcutaneous TID WC   Continuous Infusions:  PRN Meds: acetaminophen  **OR** acetaminophen, ipratropium-albuterol, oxyCODONE, promethazine, senna-docusate  Allergies:    Allergies  Allergen Reactions  . Penicillins Swelling  . Sulfa Antibiotics Rash    Social History:   Social History   Socioeconomic History  . Marital status: Widowed    Spouse name: Not on file  . Number of children: Not on file  . Years of education: Not on file  . Highest education level: Not on file  Occupational History  . Not on file  Tobacco Use  . Smoking status: Never Smoker  . Smokeless tobacco: Never Used  Substance and Sexual Activity  . Alcohol use: Never  . Drug use: Never  . Sexual activity: Not on file  Other Topics Concern  . Not on file  Social History Narrative  . Not on file   Social Determinants of Health   Financial Resource Strain:   . Difficulty of Paying Living Expenses:   Food Insecurity:   . Worried About Charity fundraiser in the Last Year:   . [place] in the Last Year:  Transportation Needs:   . Film/video editor (Medical):   [name] Lack of Transportation (Non-Medical):   Physical Activity:   . Days of Exercise per Week:   . Minutes of Exercise per Session:   Stress:   . Feeling of Stress :   Social Connections:   . Frequency of Communication with Friends and Family:   . Frequency of Social Gatherings with Friends and Family:   . Attends Religious Services:   . Active Member of Clubs or Organizations:   . Attends Archivist Meetings:   [name] Marital Status:   Intimate Partner Violence:   . Fear of Current or Ex-Partner:   . Emotionally Abused:   [name] Physically Abused:   . Sexually Abused:     Family History:   Family History  Problem Relation Age of Onset  . CAD Mother   . Heart disease Father   . CAD Maternal Grandmother      ROS:  Please see the history of present illness.  All other ROS reviewed and negative.     Physical Exam/Data:   Vitals:   03/24/20 0300 03/24/20 0445 03/24/20 0800 03/24/20 1100   BP: 102/62 102/66 (!) 100/40 (!) 127/47  Pulse: (!) 25 92 84 65  Resp: _0 Temp:  98.1 F (36.7 C) 97.8 F (36.6 C) 97.7 F (36.5 C)  TempSrc:  Oral Oral Oral  SpO2: 99% 100% 97% 98%  Weight:  110.7 kg    Height:  _1  (1.651 m)      Intake/Output Summary (Last 24 hours) at 03/24/2020 1445 Last data filed at 03/24/2020 0900 Gross per 24 hour  Intake 740 ml  Output 0 ml  Net 740 ml   Last 3 Weights 03/24/2020 03/23/2020  Weight (lbs) 244 lb 235 lb  Weight (kg) 110.678 kg 106.595 kg     Body mass index is 40.6 kg/m.  General: 79 y.o. morbidly obese female resting comfortably in no acute distress. HEENT: Normocephalic and atraumatic.  Neck: Supple. Possible soft carotid bruits bilaterally. No JVD. Heart: Irregular rhythm with normal rate. Distinct S1 and S2. No murmurs, gallops, or rubs. Radial and distal pedal pulses 2+ and equal bilaterally. Lungs: No increased work of breathing. Decreased breath sounds throughout but no wheezes, rhonchi, or rales.  Abdomen: Soft, non-distended, and non-tender to palpation. Bowel sounds present. Extremities: Trace to mild lower extremity edema.    Skin: Warm and dry. Neuro: Alert and oriented x3. No focal deficits. Psych: Normal affect. Responds appropriately.   EKG:  The EKG was personally reviewed and demonstrates:  Rate controlled atrial flutter with variable AV conduction (varies from 2:1 to 3:1).   Telemetry:  Telemetry was personally reviewed and demonstrates:  Atrial flutter with rates mostly in the 60's to 80's. Briefly in the 120's.  Relevant CV Studies:  Echocardiogram 03/24/2020: Impressions: 1. Left ventricular ejection fraction, by estimation, is 55 to 60%. The  left ventricle has normal function. The left ventricle has no regional  wall motion abnormalities. There is moderate left ventricular hypertrophy  of the posterior segment. Left  ventricular diastolic parameters are indeterminate.  2. Right ventricular  systolic function is moderately reduced. The right  ventricular size is normal. There is moderately elevated pulmonary artery  systolic pressure. The estimated right ventricular systolic pressure is  80.9 mmHg.  3. Left atrial size was severely dilated.  4. The mitral valve is normal in structure. Mild mitral valve  regurgitation. No evidence of mitral stenosis.  5. The aortic valve is tricuspid. Aortic valve regurgitation is not  visualized. Mild aortic valve sclerosis is present, with no evidence of  aortic valve stenosis.  6. Aortic dilatation noted. There is mild dilatation of the ascending  aorta measuring 38 mm.  7. The inferior vena cava is dilated in size with >50% respiratory  variability, suggesting right atrial pressure of 8 mmHg.   Laboratory Data:  High Sensitivity Troponin:   Recent Labs  Lab 03/24/20 0945 03/24/20 1150  TROPONINIHS 30* 34*     Chemistry Recent Labs  Lab 03/23/20 2208 03/24/20 0615  NA 140 139  K 4.3 4.1  CL 103 106  CO2 26 23  GLUCOSE 209* 225*  BUN 81* 77*  CREATININE 2.67* 2.44*  CALCIUM 9.0 8.9  GFRNONAA 16* 18*  GFRAA 19* 21*  ANIONGAP 11 10    No results for input(s): PROT, ALBUMIN, AST, ALT, ALKPHOS, BILITOT in the last 168 hours. Hematology Recent Labs  Lab 03/23/20 2208 03/24/20 0615  WBC 6.8 6.5  RBC 3.53* 3.48*  HGB 10.2* 9.9*  HCT 33.9* 33.6*  MCV 96.0 96.6  MCH 28.9 28.4  MCHC 30.1 29.5*  RDW 17.6* 17.6*  PLT 217 201   BNPNo results for input(s): BNP, PROBNP in the last 168 hours.  DDimer No results for input(s): DDIMER in the last 168 hours.   Radiology/Studies:  DG Chest 1 View  Result Date: 03/23/2020 CLINICAL DATA:  Fall EXAM: CHEST  1 VIEW COMPARISON:  07/07/2019 FINDINGS: Right breast prosthesis. Cardiomegaly. No acute consolidation or effusion. Aortic atherosclerosis. Calcified granulomas in the lungs. No pneumothorax. IMPRESSION: No active disease. Cardiomegaly. Electronically Signed   By: Donavan Foil M.D.   On: 03/23/2020 22:25   CT HEAD WO CONTRAST  Result Date: 03/24/2020 CLINICAL DATA:  Subdural hematoma follow-up EXAM: CT HEAD WITHOUT CONTRAST TECHNIQUE: Contiguous axial images were obtained from the base of the skull through the vertex without intravenous contrast. COMPARISON:  Head CT 03/23/2020 FINDINGS: Brain: Unchanged small parafalcine anterior subdural hematoma. No midline shift or other mass effect. Vascular: Atherosclerotic calcification of the vertebral and internal carotid arteries at the skull base. Skull: Left parietal scalp hematoma.  No skull fracture. Sinuses/Orbits: No acute finding. Other: None. IMPRESSION: 1. Unchanged small parafalcine anterior subdural hematoma. 2. Left parietal scalp hematoma without skull fracture. Electronically Signed   By: Ulyses Jarred M.D.   On: 03/24/2020 06:11   CT Head Wo Contrast  Result Date: 03/23/2020 CLINICAL DATA:  Status post trauma. EXAM: CT HEAD WITHOUT CONTRAST TECHNIQUE: Contiguous axial images were obtained from the base of the skull through the vertex without intravenous contrast. COMPARISON:  None. FINDINGS: Brain: There is mild cerebral atrophy with widening of the extra-axial spaces and ventricular dilatation. There are areas of decreased attenuation within the white matter tracts of the supratentorial brain, consistent with microvascular disease changes. A small, approximately 2 mm thick acute subdural hemorrhage is seen within the parasagittal region of the anterior interhemispheric fissure on the left (axial CT image 25). This measures approximately 1.8 cm in length. No significant mass effect or midline shift is seen. Vascular: No hyperdense vessel or unexpected calcification. Skull: Normal. Negative for fracture or focal lesion. Sinuses/Orbits: No acute finding. Other: Mild left occipital scalp soft tissue swelling is seen. IMPRESSION: 1. Small, approximately 2 mm thick acute subdural hemorrhage within the parasagittal region  of the anterior interhemispheric fissure on the left. MRI correlation is recommended. 2. Mild left occipital scalp soft tissue swelling. Electronically  Signed   By: Virgina Norfolk M.D.   On: 03/23/2020 22:42   CT Cervical Spine Wo Contrast  Result Date: 03/23/2020 CLINICAL DATA:  Status post trauma. EXAM: CT CERVICAL SPINE WITHOUT CONTRAST TECHNIQUE: Multidetector CT imaging of the cervical spine was performed without intravenous contrast. Multiplanar CT image reconstructions were also generated. COMPARISON:  None. FINDINGS: Alignment: Normal. Skull base and vertebrae: No acute fracture. No primary bone lesion or focal pathologic process. Soft tissues and spinal canal: No prevertebral fluid or swelling. No visible canal hematoma. Disc levels: Very mild anterior osteophyte formation is seen at the levels of C5-C6 and C6-C7. Mild multilevel intervertebral disc space narrowing is also seen. Moderate severity bilateral multilevel facet joint hypertrophy is noted. Upper chest: Negative. Other: None. IMPRESSION: 1. No acute fracture or subluxation of the cervical spine. 2. Mild multilevel degenerative disc disease and facet joint hypertrophy. Electronically Signed   By: Virgina Norfolk M.D.   On: 03/23/2020 22:46   MR BRAIN WO CONTRAST  Result Date: 03/24/2020 CLINICAL DATA:  Syncope EXAM: MRI HEAD WITHOUT CONTRAST TECHNIQUE: Multiplanar, multiecho pulse sequences of the brain and surrounding structures were obtained without intravenous contrast. COMPARISON:  Head CT 03/23/2020 FINDINGS: Brain: Small amount of subdural blood along the anterior falx cerebri. Multifocal white matter hyperintensity, most commonly due to chronic ischemic microangiopathy. Normal volume of CSF spaces. No chronic microhemorrhage. Normal midline structures. Vascular: Normal flow voids. Skull and upper cervical spine: Left posterior scalp hematoma. Sinuses/Orbits: Left mastoid fluid. Other: None. IMPRESSION: 1. Small amount of subdural  blood along the anterior falx cerebri. No mass effect or midline shift. 2. Left posterior scalp hematoma. 3. Left mastoid fluid. Electronically Signed   By: Ulyses Jarred M.D.   On: 03/24/2020 04:23   DG Pelvis Portable  Result Date: 03/23/2020 CLINICAL DATA:  Trauma, fall EXAM: PORTABLE PELVIS 1-2 VIEWS COMPARISON:  None. FINDINGS: There is no evidence of pelvic fracture or diastasis. No pelvic bone lesions are seen. Degenerative changes of the right greater than left hip. IMPRESSION: No acute osseous abnormality Electronically Signed   By: Donavan Foil M.D.   On: 03/23/2020 22:24   ECHOCARDIOGRAM COMPLETE  Result Date: 03/24/2020    ECHOCARDIOGRAM REPORT   Patient Name:   Ann Flynn Date of Exam: 03/24/2020 Medical Rec #:  [id number]     Height:       65.0 in Accession #:    [id number]    Weight:       244.0 lb Date of Birth:  Nov 18, 1940     BSA:          2.153 m Patient Age:    83 years      BP:           100/40 mmHg Patient Gender: F             HR:           93 bpm. Exam Location:  Inpatient Procedure: 2D Echo, Cardiac Doppler and Color Doppler Indications:    R55 Syncope  History:        Patient has no prior history of Echocardiogram examinations.                 Pulmonary HTN, Arrythmias:Atrial Fibrillation; Risk                 Factors:Hypertension and Diabetes. CKD.  Sonographer:    Jonelle Sidle Dance Referring Phys: [place]  1. Left ventricular ejection fraction, by estimation, is  55 to 60%. The left ventricle has normal function. The left ventricle has no regional wall motion abnormalities. There is moderate left ventricular hypertrophy of the posterior segment. Left ventricular diastolic parameters are indeterminate.  2. Right ventricular systolic function is moderately reduced. The right ventricular size is normal. There is moderately elevated pulmonary artery systolic pressure. The estimated right ventricular systolic pressure is 61.2 mmHg.  3. Left atrial size was severely  dilated.  4. The mitral valve is normal in structure. Mild mitral valve regurgitation. No evidence of mitral stenosis.  5. The aortic valve is tricuspid. Aortic valve regurgitation is not visualized. Mild aortic valve sclerosis is present, with no evidence of aortic valve stenosis.  6. Aortic dilatation noted. There is mild dilatation of the ascending aorta measuring 38 mm.  7. The inferior vena cava is dilated in size with >50% respiratory variability, suggesting right atrial pressure of 8 mmHg. FINDINGS  Left Ventricle: Left ventricular ejection fraction, by estimation, is 55 to 60%. The left ventricle has normal function. The left ventricle has no regional wall motion abnormalities. The left ventricular internal cavity size was normal in size. There is  moderate left ventricular hypertrophy of the posterior segment. Left ventricular diastolic parameters are indeterminate. Right Ventricle: The right ventricular size is normal. No increase in right ventricular wall thickness. Right ventricular systolic function is moderately reduced. There is moderately elevated pulmonary artery systolic pressure. The tricuspid regurgitant velocity is 2.85 m/s, and with an assumed right atrial pressure of 8 mmHg, the estimated right ventricular systolic pressure is 24.4 mmHg. Left Atrium: Left atrial size was severely dilated. Right Atrium: Right atrial size was normal in size. Pericardium: There is no evidence of pericardial effusion. Mitral Valve: The mitral valve is normal in structure. Normal mobility of the mitral valve leaflets. Moderate mitral annular calcification. Mild mitral valve regurgitation. No evidence of mitral valve stenosis. Tricuspid Valve: The tricuspid valve is normal in structure. Tricuspid valve regurgitation is trivial. No evidence of tricuspid stenosis. Aortic Valve: The aortic valve is tricuspid. Aortic valve regurgitation is not visualized. Mild aortic valve sclerosis is present, with no evidence of  aortic valve stenosis. Aortic valve mean gradient measures 7.0 mmHg. Aortic valve peak gradient measures 11.0 mmHg. Aortic valve area, by VTI measures 1.28 cm. Pulmonic Valve: The pulmonic valve was normal in structure. Pulmonic valve regurgitation is trivial. No evidence of pulmonic stenosis. Aorta: Aortic dilatation noted. There is mild dilatation of the ascending aorta measuring 38 mm. Venous: The inferior vena cava is dilated in size with greater than 50% respiratory variability, suggesting right atrial pressure of 8 mmHg. IAS/Shunts: No atrial level shunt detected by color flow Doppler.  LEFT VENTRICLE PLAX 2D LVIDd:         5.00 cm LVIDs:         3.40 cm LV PW:         1.40 cm LV IVS:        1.10 cm LVOT diam:     2.00 cm LV SV:         54 LV SV Index:   25 LVOT Area:     3.14 cm  RIGHT VENTRICLE          IVC RV Basal diam:  2.80 cm  IVC diam: 2.70 cm TAPSE (M-mode): 1.2 cm LEFT ATRIUM              Index       RIGHT ATRIUM  Index LA diam:        5.10 cm  2.37 cm/m  RA Area:     19.70 cm LA Vol (A2C):   101.0 ml 46.92 ml/m RA Volume:   47.90 ml  22.25 ml/m LA Vol (A4C):   112.0 ml 52.03 ml/m LA Biplane Vol: 107.0 ml 49.71 ml/m  AORTIC VALVE AV Area (Vmax):    1.46 cm AV Area (Vmean):   1.34 cm AV Area (VTI):     1.28 cm AV Vmax:           166.00 cm/s AV Vmean:          126.000 cm/s AV VTI:            0.421 m AV Peak Grad:      11.0 mmHg AV Mean Grad:      7.0 mmHg LVOT Vmax:         77.20 cm/s LVOT Vmean:        53.800 cm/s LVOT VTI:          0.172 m LVOT/AV VTI ratio: 0.41  AORTA Ao Root diam: 4.00 cm Ao Asc diam:  3.80 cm MITRAL VALVE                TRICUSPID VALVE MV Area (PHT): 3.85 cm     TR Peak grad:   32.5 mmHg MV Decel Time: 197 msec     TR Vmax:        285.00 cm/s MV E velocity: 153.00 cm/s MV A velocity: 72.60 cm/s   SHUNTS MV E/A ratio:  2.11         Systemic VTI:  0.17 m                             Systemic Diam: 2.00 cm Fransico Him MD Electronically signed by Fransico Him MD  Signature Date/Time: 03/24/2020/1:28:25 PM    Final      Assessment and Plan:   Syncope - Suspect this was due to hypoxia as patient's O2 tank was empty. Patient dizzy and weak prior to event. No other recent cardiac symptoms. - No signs of concerning arrhythmias on telemetry. - Echo showed LVEF of 55-60% with normal wall motion. RV normal in size with moderately reduced systolic function. PASP moderately elevated at 40.5 mmHg. - Do not think any additional work-up is needed. Will review with MD.  Severe Pulmonary Hypertension with Cor Pulmonale on 3 L of O2 at Home - Followed by Pulmonology and Dr. [name]. Felt to be due to [facility] Group 3 secondary to OSA/OHS but given markedly elevated PVR may also have component of WHO Group I & IV. - Recent Echo as above. - Seems stable.  - Suspect home Letairis and Tadalafil can be resumed.  Chronic Diastolic CHF - Appears euvolemic on exam. - BP soft at time but stable. - Would likely restart home dose of Torsemide at discharge.  Persistent Atrial Flutter - Rate controlled. - On Couamdin at home. Per Neurosurgery, this will need to be held for 7-10 days with plans for repeat head CT at that time.  Non-Obstructive CAD - On cath in 2016 in [place]. - No angina symptoms. - Continue statin. NO aspirin due to Coumadin.  Prior PE/DVT - Has been on Couamdin since that time.  Does not want to switch to DOAC due to cost - VQ scan in 06/2019 negative for PE.  Type 2 Diabetes Mellitus - Management  per primary team.  CKD Stage IV - Creatinine 2.67 on admission and down to 2.44 today. Baseline around 1.61 but was 2.3 on last check on 01/27/2020. - Follows with Nephrology as outpatient.  Of note, patient has 2 charts in the system that have not been merged yet. Outpatient medications are not the same. Would make sure to confirm which medications patient is on at home so these can be correct at discharge.  For questions or updates, please contact  [place] Please consult [email or web address] for contact info under    Signed, Darreld Mclean, PA-C  03/24/2020 2:45 PM   I have seen, examined the patient, and reviewed the above assessment and plan.  Changes to above are made where necessary.  On exam, RRR.  Normal WOB.   She presents with syncope in the setting of profound hypoxia/ SOB.  She feels quite clear that her syncope was precipitated by inadequate O2 supply as her O2 tank was empty.  She has very little O2 reserve.  My suspicion for an arrhythmogenic cause is less likely. She has chronic rate controlled atiral flutter and is on coumadin chronically.  I agree with holding her coumadin in the setting of her ICH. No further inpatient cardiology workup is planned.  Call with questions.  Co Sign: Thompson Grayer, MD 03/24/2020 3:35 PM

## 2020-03-24 NOTE — H&P (Signed)
History and Physical    Ann Flynn XLK:[id number] DOB: [birth year] DOA: 03/23/2020  PCP: Trinna Post, PA-C   Patient coming from:   Home   Chief Complaint: syncope, hit head on ground  HPI: Ann Flynn is a 79 y.o. female with medical history significant for diabetes mellitus type 2, chronic kidney disease stage IV, pulmonary hypertension, atrial fibrillation who presents by EMS after having a syncopal event at home.  Patient states that she wears oxygen when she is active at home.  She went outside to walk and did not realize that her oxygen tank had run out of oxygen.  She began to feel dizzy and lightheaded and her neighbor tried to help her get back home.  While she was walking back to the house with the neighbor she had a syncopal episode and hit the back of her head on the ground.  She sustained a small cut to the back of her head from the fall.  There was no report of any seizure-like activity or convulsions.  States she did not have any chest pain or palpitations prior to the event.  She has not had any nausea or vomiting since the event.  She does report a mild headache.  She denies any recent fever, cough, abdominal pain, urinary frequency or dysuria.  She denies any visual change, slurred speech or drooping face and denies any numbness or weakness of her extremities. She is on Coumadin for chronic A. Fib.  Reports she takes Januvia and a basal insulin for her diabetes.  She states her blood sugars have been good in the  120-200 range and she has not had any episodes of hypoglycemia. She lives alone.  She denies tobacco, alcohol, illicit drug use.  ED Course: In the emergency room patient is in A. fib.  CT of her head shows a small 2 mm subdural hematoma.  The ER physician discussed with neurosurgery who recommended patient be observed overnight.  Radiology recommends correlation MRI of the brain.  Review of Systems:  General: Denies weakness, fever, chills, weight loss, night  sweats. Denies change in appetite HENT: Denies head trauma, headache, denies change in hearing, tinnitus.  Denies nasal congestion or bleeding.  Denies sore throat, sores in mouth.  Denies difficulty swallowing Eyes: Denies blurry vision, pain in eye, drainage.  Denies discoloration of eyes. Neck: Denies pain.  Denies swelling.  Denies pain with movement. Cardiovascular: Denies chest pain, palpitations.  Denies edema.  Denies orthopnea Respiratory: Denies shortness of breath, cough.  Denies wheezing.  Denies sputum production Gastrointestinal: Denies abdominal pain, swelling.  Denies nausea, vomiting, diarrhea.  Denies melena.  Denies hematemesis. Musculoskeletal: Denies limitation of movement.  Denies deformity or swelling.  Denies pain.  Denies arthralgias or myalgias. Genitourinary: Denies pelvic pain.  Denies urinary frequency or hesitancy.  Denies dysuria.  Skin: Denies rash.  Denies petechiae, purpura, ecchymosis. Neurological: Has mild headache now.  Had syncopal event earlier. Denies seizure activity.  Denies weakness or paresthesia.  Denies slurred speech, drooping face.  Denies visual change. Psychiatric: Denies depression, anxiety.  Denies suicidal thoughts or ideation.  Denies hallucinations.  Past Medical History:  Diagnosis Date   A-fib ([place])    Anemia    Chronic kidney disease    Diabetes mellitus without complication ([place])    Hypertension    Pulmonary hypertension ([place])     Past Surgical History:  Procedure Laterality Date   ABDOMINAL HYSTERECTOMY     masectomy      Social History  reports that she has never smoked. She has never used smokeless tobacco. She reports that she does not drink alcohol and does not use drugs.  Allergies  Allergen Reactions   Penicillins Swelling   Sulfa Antibiotics Rash    History reviewed. No pertinent family history.   Prior to Admission medications   Not on File    Physical Exam: Vitals:   03/23/20 2300 03/23/20  2315 03/23/20 2330 03/23/20 2345  BP: (!) 97/57 (!) 107/58 (!) 100/53 (!) 111/95  Pulse: (!) 104 99 99 (!) 114  Resp: _0 Temp:      SpO2: 96% 98% 96% 94%  Weight:      Height:        Constitutional: NAD, calm, comfortable Vitals:   03/23/20 2300 03/23/20 2315 03/23/20 2330 03/23/20 2345  BP: (!) 97/57 (!) 107/58 (!) 100/53 (!) 111/95  Pulse: (!) 104 99 99 (!) 114  Resp: _1 Temp:      SpO2: 96% 98% 96% 94%  Weight:      Height:       General: WDWN, Alert and oriented x3.  Eyes: EOMI, PERRL, lids and conjunctivae normal.  Sclera nonicteric HENT:  [place], Has 1 cm superficial laceration to posterior head. external ears normal.  Nares patent without epistasis.  Mucous membranes are moist. Posterior pharynx clear of any exudate or lesions. Normal dentition.  Neck: Soft, normal range of motion, supple, no masses, no thyromegaly.  Trachea midline Respiratory: clear to auscultation bilaterally, no wheezing, no crackles. Normal respiratory effort. No accessory muscle use.  Cardiovascular: Regular rhythm.  Normal rate.  No murmurs / rubs / gallops.  Mild lower extremity edema. 2+ pedal pulses. No carotid bruits.  Abdomen: Soft, no tenderness, nondistended, obese.  no rebound or guarding.  No masses palpated. Bowel sounds normoactive Musculoskeletal: FROM. no clubbing / cyanosis. No joint deformity upper and lower extremities. no contractures. Normal muscle tone.  Skin: Warm, dry, intact no rashes, lesions, ulcers. No induration Neurologic: CN 2-12 grossly intact.  Normal speech.  Sensation intact, patella DTR +1 bilaterally. Strength 5/5 in all extremities.   Psychiatric: Normal judgment and insight.  Normal mood.    Labs on Admission: I have personally reviewed following labs and imaging studies  CBC: Recent Labs  Lab 03/23/20 2208  WBC 6.8  NEUTROABS 4.2  HGB 10.2*  HCT 33.9*  MCV 96.0  PLT 111    Basic Metabolic Panel: Recent Labs  Lab 03/23/20 2208  NA  140  K 4.3  CL 103  CO2 26  GLUCOSE 209*  BUN 81*  CREATININE 2.67*  CALCIUM 9.0    GFR: Estimated Creatinine Clearance: 21.1 mL/min (A) (by C-G formula based on SCr of 2.67 mg/dL (H)).  Liver Function Tests: No results for input(s): AST, ALT, ALKPHOS, BILITOT, PROT, ALBUMIN in the last 168 hours.  Urine analysis: No results found for: COLORURINE, APPEARANCEUR, LABSPEC, [place], GLUCOSEU, HGBUR, BILIRUBINUR, KETONESUR, PROTEINUR, UROBILINOGEN, NITRITE, LEUKOCYTESUR  Radiological Exams on Admission: DG Chest 1 View  Result Date: 03/23/2020 CLINICAL DATA:  Fall EXAM: CHEST  1 VIEW COMPARISON:  07/07/2019 FINDINGS: Right breast prosthesis. Cardiomegaly. No acute consolidation or effusion. Aortic atherosclerosis. Calcified granulomas in the lungs. No pneumothorax. IMPRESSION: No active disease. Cardiomegaly. Electronically Signed   By: Donavan Foil M.D.   On: 03/23/2020 22:25   CT Head Wo Contrast  Result Date: 03/23/2020 CLINICAL DATA:  Status post trauma. EXAM: CT HEAD WITHOUT CONTRAST TECHNIQUE: Contiguous axial  images were obtained from the base of the skull through the vertex without intravenous contrast. COMPARISON:  None. FINDINGS: Brain: There is mild cerebral atrophy with widening of the extra-axial spaces and ventricular dilatation. There are areas of decreased attenuation within the white matter tracts of the supratentorial brain, consistent with microvascular disease changes. A small, approximately 2 mm thick acute subdural hemorrhage is seen within the parasagittal region of the anterior interhemispheric fissure on the left (axial CT image 25). This measures approximately 1.8 cm in length. No significant mass effect or midline shift is seen. Vascular: No hyperdense vessel or unexpected calcification. Skull: Normal. Negative for fracture or focal lesion. Sinuses/Orbits: No acute finding. Other: Mild left occipital scalp soft tissue swelling is seen. IMPRESSION: 1. Small,  approximately 2 mm thick acute subdural hemorrhage within the parasagittal region of the anterior interhemispheric fissure on the left. MRI correlation is recommended. 2. Mild left occipital scalp soft tissue swelling. Electronically Signed   By: Virgina Norfolk M.D.   On: 03/23/2020 22:42   CT Cervical Spine Wo Contrast  Result Date: 03/23/2020 CLINICAL DATA:  Status post trauma. EXAM: CT CERVICAL SPINE WITHOUT CONTRAST TECHNIQUE: Multidetector CT imaging of the cervical spine was performed without intravenous contrast. Multiplanar CT image reconstructions were also generated. COMPARISON:  None. FINDINGS: Alignment: Normal. Skull base and vertebrae: No acute fracture. No primary bone lesion or focal pathologic process. Soft tissues and spinal canal: No prevertebral fluid or swelling. No visible canal hematoma. Disc levels: Very mild anterior osteophyte formation is seen at the levels of C5-C6 and C6-C7. Mild multilevel intervertebral disc space narrowing is also seen. Moderate severity bilateral multilevel facet joint hypertrophy is noted. Upper chest: Negative. Other: None. IMPRESSION: 1. No acute fracture or subluxation of the cervical spine. 2. Mild multilevel degenerative disc disease and facet joint hypertrophy. Electronically Signed   By: Virgina Norfolk M.D.   On: 03/23/2020 22:46   DG Pelvis Portable  Result Date: 03/23/2020 CLINICAL DATA:  Trauma, fall EXAM: PORTABLE PELVIS 1-2 VIEWS COMPARISON:  None. FINDINGS: There is no evidence of pelvic fracture or diastasis. No pelvic bone lesions are seen. Degenerative changes of the right greater than left hip. IMPRESSION: No acute osseous abnormality Electronically Signed   By: Donavan Foil M.D.   On: 03/23/2020 22:24    EKG: Independently reviewed.  Atrial fibrillation.  Nonspecific ST changes.  Prolonged QTc noted  Assessment/Plan Principal Problem:   Syncope Ms. Niehaus will be placed on telemetry unit for observation.  Has history of A. fib  which is stable was walking outside in the heat of the afternoon and her oxygen tank had run out of oxygen in the resultant hypoxia could have contributed to her syncopal event.  She has no focal neurological deficits.  Active Problems:   Atrial fibrillation, chronic (HCC) INR is 3.  Coumadin will be held until neurosurgery evaluation with subdural hematoma.    Type 2 diabetes mellitus with stage 4 chronic kidney disease (HCC) Monitor blood sugars before every meal nightly.  Sliding scale insulin as need for glycemic control.  Home medications will be verified and reconciled by pharmacy and then resumed.    Pulmonary hypertension (HCC) Chronic.  Home medication will be verified and reconciled by pharmacy and then resumed    Subdural hematoma, acute (HCC) Small subdural hematoma noted on CT scan.  Neurosurgery was consulted by ER provider.  Radiology recommended MRI of the head for further correlation.  MRI of the brain is ordered.  DVT prophylaxis: Patient has been on Coumadin and INR is therapeutic at 3.0. Code Status:   Full code Family Communication:  Diagnosis and plan discussed with patient.  Patient verbalized understanding agrees with plan.  Further agrees to follow as clinically indicated Disposition Plan:   Patient is from:  Home  Anticipated DC to:  Home  Anticipated DC date:  Anticipate less than 2 midnight stay in the hospital.  Anticipated DC barriers: No barriers to discharge notified this time  Consults called:  Surgery Admission status:  Observation  Severity of Illness: The appropriate patient status for this patient is OBSERVATION. Observation status is judged to be reasonable and necessary in order to provide the required intensity of service to ensure the patient's safety. The patient's presenting symptoms, physical exam findings, and initial radiographic and laboratory data in the context of their medical condition is felt to place them at decreased risk for  further clinical deterioration. Furthermore, it is anticipated that the patient will be medically stable for discharge from the hospital within 2 midnights of admission. The following factors support the patient status of observation.   " The patient's presenting symptoms include syncope, laceration to head. " The physical exam findings include patient posterior head otherwise exam unremarkable " The initial radiographic and laboratory data are small subdural hematoma noted on CT of head.      Yevonne Aline Kayven Aldaco MD [facility]  How to contact the [facility] Attending or Consulting provider [place] or covering provider during after hours [place], for this patient?   1. Check the care team in [facility] and look for a) attending/consulting [facility] provider listed and b) the [facility] team listed 2. Log into [email or web address] and use [place]'s universal password to access. If you do not have the password, please contact the hospital operator. 3. Locate the [facility] provider you are looking for under [facility] and page to a number that you can be directly reached. 4. If you still have difficulty reaching the provider, please page the [facility] (Director on Call) for the Hospitalists listed on amion for assistance.  03/24/2020, 12:51 AM

## 2020-03-24 NOTE — Progress Notes (Signed)
  Echocardiogram 2D Echocardiogram has been performed.  Boneta Standre G Talha Iser 03/24/2020, 10:27 AM

## 2020-03-24 NOTE — Progress Notes (Signed)
Received pt from the ED, alert X4, oriented to the room, implemented MD orders, will continue to monitor

## 2020-03-24 NOTE — Consult Note (Signed)
   Providing Compassionate, Quality Care - Together  Neurosurgery Consult  Referring physician: Dr. Darrick Meigs Reason for referral: SDH  Chief Complaint: syncope and fall  History of Present Illness: This is a 79 year old female that had a syncopal event at home and fell and hit her head.  She has a history of A. fib and is on Coumadin, also has a history of pulmonary hypertension, chronic kidney disease and diabetes.  At this time she denies any numbness or tingling or weakness.  She denies any bowel or bladder changes.  She denies any other trauma.  She does complain of a mild headache.  She denies blurry vision or visual changes.  History reviewed.  Pertinent medical history listed above History reviewed. No pertinent surgical history.  Medications: I have reviewed the patient's current medications. Allergies: No Known Allergies  History reviewed. No pertinent family history. Social History:  has no history on file for tobacco use, alcohol use, and drug use.  ROS: 14 point review of systems was obtained with all pertinent positives and negatives are listed in the HPI above  Physical Exam:  Vital signs in last 24 hours: Temp:  [98 F (36.7 C)-98.3 F (36.8 C)] 98 F (36.7 C) (07/25 1814) Pulse Rate:  [58-128] 65 (07/26 0746) Resp:  [11-18] 14 (07/26 0217) BP: (138-182)/(65-125) 153/88 (07/26 0700) SpO2:  [91 %-98 %] 96 % (07/26 0746) PE: Awake alert oriented x3 Pupils equally round reactive light EOMI Face symmetric Full strength bilateral upper and lower extremities No drift No Hoffmann's Sensory intact to light touch   Impression/Assessment:  79 year old female with 1.  Small parafalcine acute subdural hematoma without mass-effect secondary to trauma 2.  Fall due to syncopal event  Plan:  -Recommend holding Coumadin for 7 to 10 days and repeating a CT scan at that time with follow-up to my office for evaluation of CT findings. -CT reviewed which showed a very small  parafalcine subdural without mass-effect therefore did not recommend reversal of Coumadin.  I did recommend holding it at this time.  Patient remains neurologically intact. -No acute neurosurgical intervention -Syncopal work-up per primary team  Thank you for allowing me to participate in this patient's care.  Please do not hesitate to call with questions or concerns.   Elwin Sleight, [facility] Cell: [phone]

## 2020-03-26 ENCOUNTER — Other Ambulatory Visit: Payer: Self-pay | Admitting: Neurological Surgery

## 2020-03-26 ENCOUNTER — Encounter: Payer: Self-pay | Admitting: Gastroenterology

## 2020-03-26 DIAGNOSIS — S065XAA Traumatic subdural hemorrhage with loss of consciousness status unknown, initial encounter: Secondary | ICD-10-CM

## 2020-04-02 ENCOUNTER — Ambulatory Visit: Payer: Medicare Other | Admitting: Gastroenterology

## 2020-04-03 ENCOUNTER — Ambulatory Visit: Payer: Medicare Other

## 2020-04-03 ENCOUNTER — Telehealth (HOSPITAL_COMMUNITY): Payer: Self-pay

## 2020-04-03 NOTE — Telephone Encounter (Signed)
Received clearance form from [facility], pt needs clearance for colonoscopy. Pt needs to hold Coumadin.   Per Dr [name]: "with pulmonary HTN and recent subdural hematoma, would avoid colonoscopy unless urgent"   Form faxed back to them at [phone]

## 2020-04-03 NOTE — Telephone Encounter (Signed)
Dr. Glori Bickers stated that the patient was too high risk. Therefore, she did not get cardiac clearance to have her colonoscopy. This will be informed to Dr. Bonna Gains and patient. We have not received an answer from the pulmonologist.

## 2020-04-04 ENCOUNTER — Telehealth: Payer: Self-pay | Admitting: Physician Assistant

## 2020-04-04 NOTE — Telephone Encounter (Signed)
Copied from [place] [id number]. Topic: Medicare AWV >> Apr 04, 2020 11:48 AM Cher Nakai R wrote: Reason for CRM:  Left message for patient to call back and schedule Medicare Annual Wellness Visit (AWV) either virtually or in office.  No hx of AWV eligible as of 08/18/2009  Please schedule at anytime with [facility] Health Advisor.

## 2020-04-09 ENCOUNTER — Telehealth: Payer: Self-pay | Admitting: Gastroenterology

## 2020-04-09 ENCOUNTER — Inpatient Hospital Stay: Payer: Medicare Other | Admitting: Physician Assistant

## 2020-04-09 ENCOUNTER — Ambulatory Visit
Admission: RE | Admit: 2020-04-09 | Discharge: 2020-04-09 | Disposition: A | Discharge status: Home / Self Care | Payer: Medicare Other | Source: Ambulatory Visit | Attending: Neurological Surgery | Admitting: Neurological Surgery

## 2020-04-09 DIAGNOSIS — S065XAA Traumatic subdural hemorrhage with loss of consciousness status unknown, initial encounter: Secondary | ICD-10-CM

## 2020-04-09 NOTE — Telephone Encounter (Signed)
[name], MD  You; Ann Flynn 4 days ago   Schedule phone appt with me in 1-2 weeks    Appointment had been scheduled for 04/18/2020.

## 2020-04-10 ENCOUNTER — Ambulatory Visit (INDEPENDENT_AMBULATORY_CARE_PROVIDER_SITE_OTHER): Payer: Medicare Other

## 2020-04-10 ENCOUNTER — Inpatient Hospital Stay: Payer: Medicare Other | Admitting: Physician Assistant

## 2020-04-10 ENCOUNTER — Other Ambulatory Visit: Payer: Self-pay

## 2020-04-10 DIAGNOSIS — I639 Cerebral infarction, unspecified: Secondary | ICD-10-CM | POA: Diagnosis not present

## 2020-04-10 LAB — POCT INR
INR: 1.1 — AB (ref 2.0–3.0)
Prothrombin Time: 13.2

## 2020-04-10 NOTE — Patient Instructions (Signed)
Description   4 mg q d except 3 mg T, Th & Sat Return back to clinic in 3 days 8/27/2.

## 2020-04-10 NOTE — Telephone Encounter (Signed)
Opened in error

## 2020-04-12 NOTE — Telephone Encounter (Signed)
Pulmonologist replied back stating that the patient had been hospitalized on 03/22/2020, therefore, she was too high risk and that she needed a follow up appointment with him as well.

## 2020-04-13 ENCOUNTER — Other Ambulatory Visit: Payer: Self-pay

## 2020-04-13 ENCOUNTER — Ambulatory Visit (INDEPENDENT_AMBULATORY_CARE_PROVIDER_SITE_OTHER): Payer: Medicare Other | Admitting: Family Medicine

## 2020-04-13 DIAGNOSIS — I639 Cerebral infarction, unspecified: Secondary | ICD-10-CM | POA: Diagnosis not present

## 2020-04-13 LAB — POCT INR
INR: 1.2 — AB (ref 2.0–3.0)
Prothrombin Time: 14.1

## 2020-04-13 NOTE — Patient Instructions (Signed)
Description   4 mg q d except 3 mg T, Th & Sat Return back to clinic in 4 days days 04/17/20.

## 2020-04-16 ENCOUNTER — Other Ambulatory Visit: Payer: Self-pay | Admitting: Physician Assistant

## 2020-04-16 NOTE — Telephone Encounter (Signed)
Requested medication (s) are due for refill today:  Yes  Requested medication (s) are on the active medication list:  Yes  Future visit scheduled:  Yes  Last Refill: Historical provider  Requested Prescriptions  Pending Prescriptions Disp Refills   JANUVIA 25 MG tablet [Pharmacy Med Name: JANUVIA 25 MG TAB] 90 tablet     Sig: [place]      Endocrinology:  Diabetes - DPP-4 Inhibitors Failed - 04/16/2020  5:56 PM      Failed - Cr in normal range and within 360 days    Creatinine, Ser  Date Value Ref Range Status  03/24/2020 2.44 (H) 0.44 - 1.00 mg/dL Final          Failed - Valid encounter within last 6 months    Recent Outpatient Visits           3 days ago Cerebrovascular accident (CVA), unspecified mechanism ([place])   [place], Donald E, MD   2 months ago Congestive heart failure, unspecified HF chronicity, unspecified heart failure type [facility])   [place], Adriana M, PA-C   6 months ago Chronic pulmonary embolism, unspecified pulmonary embolism type, unspecified whether acute cor pulmonale present [facility])   [facility] [place], Fabio Bering M, PA-C   8 months ago History of stroke   [place], [place], PA-C   9 months ago Diabetes mellitus without complication [facility])   [facility] [place], [place], PA-C       Future Appointments             In 2 days [name], MD [place]   In 3 days Trinna Post, PA-C [employer], [facility]            Passed - HBA1C is between 0 and 7.9 and within 180 days    Hgb A1c MFr Bld  Date Value Ref Range Status  03/24/2020 7.9 (H) 4.8 - 5.6 % Final    Comment:    (NOTE) Pre diabetes:          5.7%-6.4%  Diabetes:              >6.4%  Glycemic control for   <7.0% adults with diabetes

## 2020-04-17 ENCOUNTER — Ambulatory Visit (INDEPENDENT_AMBULATORY_CARE_PROVIDER_SITE_OTHER): Payer: Medicare Other

## 2020-04-17 ENCOUNTER — Other Ambulatory Visit: Payer: Self-pay

## 2020-04-17 ENCOUNTER — Telehealth: Payer: Self-pay | Admitting: Internal Medicine

## 2020-04-17 DIAGNOSIS — I639 Cerebral infarction, unspecified: Secondary | ICD-10-CM

## 2020-04-17 LAB — POCT INR
INR: 1.8 — AB (ref 2.0–3.0)
PT: 21.4

## 2020-04-17 MED ORDER — AMBRISENTAN 5 MG PO TABS: 5.0000 mg | ORAL_TABLET | Freq: Every day | ORAL | 3 refills | Status: DC

## 2020-04-17 MED ORDER — TADALAFIL (PAH) 20 MG PO TABS: 40.0000 mg | ORAL_TABLET | Freq: Every day | ORAL | 3 refills | Status: DC

## 2020-04-17 NOTE — Patient Instructions (Addendum)
Description   4 mg q d except 3 mg T, Th & Sat Return back to clinic in 4 days days 04/17/20.  Note: no changes made. Provider is going to call patient to let her know what to do and when to come back. Patient advised.

## 2020-04-17 NOTE — Telephone Encounter (Signed)
Spoke with the pt and meds have been refilled as requested nothing further needed.

## 2020-04-17 NOTE — Telephone Encounter (Signed)
I have sent the refiill for the ambrisentan 5 mg to [facility]

## 2020-04-18 ENCOUNTER — Encounter: Payer: Self-pay | Admitting: Gastroenterology

## 2020-04-18 ENCOUNTER — Ambulatory Visit: Payer: Medicare Other | Admitting: Gastroenterology

## 2020-04-18 ENCOUNTER — Telehealth (INDEPENDENT_AMBULATORY_CARE_PROVIDER_SITE_OTHER): Payer: Medicare Other | Admitting: Gastroenterology

## 2020-04-18 DIAGNOSIS — D509 Iron deficiency anemia, unspecified: Secondary | ICD-10-CM | POA: Diagnosis not present

## 2020-04-18 NOTE — Progress Notes (Signed)
Ann Antigua, MD [address]  [place]  [place], [place] [address]  Main: [phone]  Fax: [phone]   Primary Care Physician: Paulene Floor  Virtual Visit via Telephone Note  I connected with patient on 04/18/20 at  3:15 PM EDT by telephone and verified that I am speaking with the correct person using two identifiers.   I discussed the limitations, risks, security and privacy concerns of performing an evaluation and management service by telephone and the availability of in person appointments. I also discussed with the patient that there may be a patient responsible charge related to this service. The patient expressed understanding and agreed to proceed.  Location of Patient: Home Location of Provider: Home Persons involved: Patient and provider only during the visit (nursing staff and front desk staff was involved in communicating with the patient prior to the appointment, reviewing medications and checking them in)   History of Present Illness: Chief complaint: Iron deficiency anemia  HPI: Ann Flynn is a 79 y.o. female previously seen for iron deficiency anemia and positive stool occult blood test and colonoscopy and EGD were planned.  However, patient was not cleared by cardiology and pulmonology.  Please see their notes under media.  Since last visit, patient was recently admitted and discharged on March 24, 2020 with subdural hematoma post fall managed conservatively by neurosurgery patient states she has seen her neurosurgeon, Dr. Merla Riches since hospital discharge and she was told she does not need to follow-up with them again. Patient has an upcoming appointment with her cardiologist on 16 September. Patient denies any signs of active bleeding.  Previous history: Patient recently found deficiency anemia stool test was positive for occult blood.  Patient reports history [place]  Does not recall the name of the  center where he was done.  States it was normal and no polyps were found.  Had a colonoscopy once prior to that one polyp was found at that time.  Patient denies any sources of active bleeding. The patient denies abdominal or flank pain, anorexia, nausea or vomiting, dysphagia, change in bowel habits or black or bloody stools or weight loss.  No family history of colon cancer.  Patient is on home oxygen for the last 5 years  Current Outpatient Medications  Medication Sig Dispense Refill  . acetaminophen (TYLENOL) 500 MG tablet Take 500 mg by mouth 2 (two) times daily.     [name] ambrisentan (LETAIRIS) 5 MG tablet Take 1 tablet (5 mg total) by mouth daily. 90 tablet 3  . ambrisentan (LETAIRIS) 5 MG tablet Take 5 mg by mouth at bedtime.    [name] atorvastatin (LIPITOR) 80 MG tablet TAKE 1 TABLET BY MOUTH DAILY 90 tablet 2  . atorvastatin (LIPITOR) 80 MG tablet Take 80 mg by mouth daily.    . BD PEN NEEDLE NANO U/F 32G X 4 MM MISC USE TO INJECT ONCE DAILY AS DIRECTED 100 each 2  . Cyanocobalamin (B-12 PO) Take 1 tablet by mouth daily.    [name] docusate sodium (COLACE) 100 MG capsule Take 100 mg by mouth at bedtime.     . Dulaglutide (TRULICITY) 4.85 IO/2.7OJ SOPN Inject 0.75 mg into the skin once a week. (Patient not taking: Reported on 02/17/2020) 4 pen 0  . empagliflozin (JARDIANCE) 10 MG TABS tablet Take 1 tablet (10 mg total) by mouth daily before breakfast. (Patient not taking: Reported on 03/05/2020) 30 tablet 1  . FEROSUL 325 (65 Fe)  MG tablet TAKE 1 TABLET BY MOUTH 3 TIMES DAILY WITH MEALS 90 tablet 0  . FEROSUL 325 (65 Fe) MG tablet Take 325 mg by mouth 3 (three) times daily.    [name] JANUVIA 25 MG tablet Take 25 mg by mouth daily.    [name] losartan (COZAAR) 100 MG tablet TAKE ONE TABLET EVERY DAY (Patient taking differently: at bedtime. ) 90 tablet 0  . losartan (COZAAR) 100 MG tablet Take 100 mg by mouth daily.    . metoprolol tartrate (LOPRESSOR) 25 MG tablet TAKE 1 TABLET BY MOUTH TWICE DAILY 180 tablet 1  .  metoprolol tartrate (LOPRESSOR) 25 MG tablet Take 25 mg by mouth 2 (two) times daily.    . Na Sulfate-K Sulfate-Mg Sulf 17.5-3.13-1.6 GM/177ML SOLN At 5 PM the day before procedure take 1 bottle and 5 hours before procedure take 1 bottle. 354 mL 0  . omeprazole (PRILOSEC) 40 MG capsule TAKE 1 CAPSULE BY MOUTH ONCE DAILY 90 capsule 1  . omeprazole (PRILOSEC) 40 MG capsule Take 40 mg by mouth daily.    [name] ULTRA test strip USE AS DIRECTED 100 each 1  . tadalafil, PAH, (ADCIRCA) 20 MG tablet Take 2 tablets (40 mg total) by mouth at bedtime. 180 tablet 3  . torsemide (DEMADEX) 20 MG tablet Take 2 tablets (40 mg total) by mouth daily. (Patient taking differently: Take 20 mg by mouth 2 (two) times daily. ) 180 tablet 0  . torsemide (DEMADEX) 20 MG tablet Take 40 mg by mouth daily.    [name] SOLOSTAR 300 UNIT/ML Solostar Pen INJECT 50 UNITS INTO THE SKIN DAILY (Patient taking differently: at bedtime. ) 4.5 mL 2  . TOUJEO SOLOSTAR 300 UNIT/ML Solostar Pen Inject 50 Units into the skin at bedtime.    [name] venlafaxine (EFFEXOR) 75 MG tablet TAKE ONE TABLET EVERY DAY (Patient taking differently: every other day. ) 90 tablet 0  . venlafaxine (EFFEXOR) 75 MG tablet Take 75 mg by mouth daily.    [name] VITAMIN D PO Take 2,000 Units by mouth daily.     [name] warfarin (COUMADIN) 3 MG tablet TAKE 1 TABLET BY MOUTH ON MONDAY WEDNESDAY AND FRIDAY ALONG WITH 4MG ON THE REMAINING DAYS (Patient taking differently: TAKE 3 TABLET BY MOUTH ON Tuesday, Thursday, Saturday 4MG ON THE REMAINING DAYS) 30 tablet 0  . warfarin (COUMADIN) 4 MG tablet Take 4 mg by mouth daily. Tuesday, Thursday, Saturday and Sunday     No current facility-administered medications for this visit.    Allergies as of 04/18/2020 - Review Complete 03/24/2020  Allergen Reaction Noted  . Penicillins Swelling and Rash 03/28/2019  . Sulfa antibiotics Rash 03/28/2019  . Penicillins Swelling 03/23/2020  . Sulfa antibiotics Rash 03/23/2020    Review of  Systems:    All systems reviewed and negative except where noted in HPI.   Observations/Objective:  Labs: CMP     Component Value Date/Time   NA 139 03/24/2020 0615   NA 141 01/18/2020 1155   K 4.1 03/24/2020 0615   CL 106 03/24/2020 0615   CO2 23 03/24/2020 0615   GLUCOSE 225 (H) 03/24/2020 0615   BUN 77 (H) 03/24/2020 0615   BUN 51 (H) 01/18/2020 1155   CREATININE 2.44 (H) 03/24/2020 0615   CALCIUM 8.9 03/24/2020 0615   PROT 6.0 (L) 01/27/2020 1150   PROT 6.1 01/18/2020 1155   ALBUMIN 3.5 01/27/2020 1150   ALBUMIN 4.0 01/18/2020 1155   AST 16 01/27/2020 1150   ALT  12 01/27/2020 1150   ALKPHOS 56 01/27/2020 1150   BILITOT 0.5 01/27/2020 1150   BILITOT 0.3 01/18/2020 1155   GFRNONAA 18 (L) 03/24/2020 0615   GFRAA 21 (L) 03/24/2020 0615   Lab Results  Component Value Date   WBC 6.5 03/24/2020   HGB 9.9 (L) 03/24/2020   HCT 33.6 (L) 03/24/2020   MCV 96.6 03/24/2020   PLT 201 03/24/2020    Imaging Studies: DG Chest 1 View  Result Date: 03/23/2020 CLINICAL DATA:  Fall EXAM: CHEST  1 VIEW COMPARISON:  07/07/2019 FINDINGS: Right breast prosthesis. Cardiomegaly. No acute consolidation or effusion. Aortic atherosclerosis. Calcified granulomas in the lungs. No pneumothorax. IMPRESSION: No active disease. Cardiomegaly. Electronically Signed   By: Donavan Foil M.D.   On: 03/23/2020 22:25   CT HEAD WO CONTRAST  Result Date: 04/09/2020 CLINICAL DATA:  Loss of consciousness after head injury 2 weeks ago. EXAM: CT HEAD WITHOUT CONTRAST TECHNIQUE: Contiguous axial images were obtained from the base of the skull through the vertex without intravenous contrast. COMPARISON:  March 24, 2020. FINDINGS: Brain: No evidence of acute infarction, hemorrhage, hydrocephalus, extra-axial collection or mass lesion/mass effect. Subdural hematoma noted on prior exam has resolved. Vascular: No hyperdense vessel or unexpected calcification. Skull: Normal. Negative for fracture or focal lesion.  Sinuses/Orbits: No acute finding. Other: None. IMPRESSION: No acute intracranial abnormality seen. Subdural hematoma noted on prior exam has resolved. Electronically Signed   By: Marijo Conception M.D.   On: 04/09/2020 11:34   CT HEAD WO CONTRAST  Result Date: 03/24/2020 CLINICAL DATA:  Subdural hematoma follow-up EXAM: CT HEAD WITHOUT CONTRAST TECHNIQUE: Contiguous axial images were obtained from the base of the skull through the vertex without intravenous contrast. COMPARISON:  Head CT 03/23/2020 FINDINGS: Brain: Unchanged small parafalcine anterior subdural hematoma. No midline shift or other mass effect. Vascular: Atherosclerotic calcification of the vertebral and internal carotid arteries at the skull base. Skull: Left parietal scalp hematoma.  No skull fracture. Sinuses/Orbits: No acute finding. Other: None. IMPRESSION: 1. Unchanged small parafalcine anterior subdural hematoma. 2. Left parietal scalp hematoma without skull fracture. Electronically Signed   By: Ulyses Jarred M.D.   On: 03/24/2020 06:11   CT Head Wo Contrast  Result Date: 03/23/2020 CLINICAL DATA:  Status post trauma. EXAM: CT HEAD WITHOUT CONTRAST TECHNIQUE: Contiguous axial images were obtained from the base of the skull through the vertex without intravenous contrast. COMPARISON:  None. FINDINGS: Brain: There is mild cerebral atrophy with widening of the extra-axial spaces and ventricular dilatation. There are areas of decreased attenuation within the white matter tracts of the supratentorial brain, consistent with microvascular disease changes. A small, approximately 2 mm thick acute subdural hemorrhage is seen within the parasagittal region of the anterior interhemispheric fissure on the left (axial CT image 25). This measures approximately 1.8 cm in length. No significant mass effect or midline shift is seen. Vascular: No hyperdense vessel or unexpected calcification. Skull: Normal. Negative for fracture or focal lesion.  Sinuses/Orbits: No acute finding. Other: Mild left occipital scalp soft tissue swelling is seen. IMPRESSION: 1. Small, approximately 2 mm thick acute subdural hemorrhage within the parasagittal region of the anterior interhemispheric fissure on the left. MRI correlation is recommended. 2. Mild left occipital scalp soft tissue swelling. Electronically Signed   By: Virgina Norfolk M.D.   On: 03/23/2020 22:42   CT Cervical Spine Wo Contrast  Result Date: 03/23/2020 CLINICAL DATA:  Status post trauma. EXAM: CT CERVICAL SPINE WITHOUT CONTRAST TECHNIQUE: Multidetector CT  imaging of the cervical spine was performed without intravenous contrast. Multiplanar CT image reconstructions were also generated. COMPARISON:  None. FINDINGS: Alignment: Normal. Skull base and vertebrae: No acute fracture. No primary bone lesion or focal pathologic process. Soft tissues and spinal canal: No prevertebral fluid or swelling. No visible canal hematoma. Disc levels: Very mild anterior osteophyte formation is seen at the levels of C5-C6 and C6-C7. Mild multilevel intervertebral disc space narrowing is also seen. Moderate severity bilateral multilevel facet joint hypertrophy is noted. Upper chest: Negative. Other: None. IMPRESSION: 1. No acute fracture or subluxation of the cervical spine. 2. Mild multilevel degenerative disc disease and facet joint hypertrophy. Electronically Signed   By: Virgina Norfolk M.D.   On: 03/23/2020 22:46   MR BRAIN WO CONTRAST  Result Date: 03/24/2020 CLINICAL DATA:  Syncope EXAM: MRI HEAD WITHOUT CONTRAST TECHNIQUE: Multiplanar, multiecho pulse sequences of the brain and surrounding structures were obtained without intravenous contrast. COMPARISON:  Head CT 03/23/2020 FINDINGS: Brain: Small amount of subdural blood along the anterior falx cerebri. Multifocal white matter hyperintensity, most commonly due to chronic ischemic microangiopathy. Normal volume of CSF spaces. No chronic microhemorrhage. Normal  midline structures. Vascular: Normal flow voids. Skull and upper cervical spine: Left posterior scalp hematoma. Sinuses/Orbits: Left mastoid fluid. Other: None. IMPRESSION: 1. Small amount of subdural blood along the anterior falx cerebri. No mass effect or midline shift. 2. Left posterior scalp hematoma. 3. Left mastoid fluid. Electronically Signed   By: Ulyses Jarred M.D.   On: 03/24/2020 04:23   DG Pelvis Portable  Result Date: 03/23/2020 CLINICAL DATA:  Trauma, fall EXAM: PORTABLE PELVIS 1-2 VIEWS COMPARISON:  None. FINDINGS: There is no evidence of pelvic fracture or diastasis. No pelvic bone lesions are seen. Degenerative changes of the right greater than left hip. IMPRESSION: No acute osseous abnormality Electronically Signed   By: Donavan Foil M.D.   On: 03/23/2020 22:24   ECHOCARDIOGRAM COMPLETE  Result Date: 03/24/2020    ECHOCARDIOGRAM REPORT   Patient Name:   DINAH LUPA Date of Exam: 03/24/2020 Medical Rec #:  [id number]     Height:       65.0 in Accession #:    [id number]    Weight:       244.0 lb Date of Birth:  1940/10/12     BSA:          2.153 m Patient Age:    78 years      BP:           100/40 mmHg Patient Gender: F             HR:           93 bpm. Exam Location:  Inpatient Procedure: 2D Echo, Cardiac Doppler and Color Doppler Indications:    R55 Syncope  History:        Patient has no prior history of Echocardiogram examinations.                 Pulmonary HTN, Arrythmias:Atrial Fibrillation; Risk                 Factors:Hypertension and Diabetes. CKD.  Sonographer:    Jonelle Sidle Dance Referring Phys: [place]  1. Left ventricular ejection fraction, by estimation, is 55 to 60%. The left ventricle has normal function. The left ventricle has no regional wall motion abnormalities. There is moderate left ventricular hypertrophy of the posterior segment. Left ventricular diastolic parameters are indeterminate.  2. Right ventricular systolic  function is moderately reduced. The  right ventricular size is normal. There is moderately elevated pulmonary artery systolic pressure. The estimated right ventricular systolic pressure is 41.7 mmHg.  3. Left atrial size was severely dilated.  4. The mitral valve is normal in structure. Mild mitral valve regurgitation. No evidence of mitral stenosis.  5. The aortic valve is tricuspid. Aortic valve regurgitation is not visualized. Mild aortic valve sclerosis is present, with no evidence of aortic valve stenosis.  6. Aortic dilatation noted. There is mild dilatation of the ascending aorta measuring 38 mm.  7. The inferior vena cava is dilated in size with >50% respiratory variability, suggesting right atrial pressure of 8 mmHg. FINDINGS  Left Ventricle: Left ventricular ejection fraction, by estimation, is 55 to 60%. The left ventricle has normal function. The left ventricle has no regional wall motion abnormalities. The left ventricular internal cavity size was normal in size. There is  moderate left ventricular hypertrophy of the posterior segment. Left ventricular diastolic parameters are indeterminate. Right Ventricle: The right ventricular size is normal. No increase in right ventricular wall thickness. Right ventricular systolic function is moderately reduced. There is moderately elevated pulmonary artery systolic pressure. The tricuspid regurgitant velocity is 2.85 m/s, and with an assumed right atrial pressure of 8 mmHg, the estimated right ventricular systolic pressure is 40.8 mmHg. Left Atrium: Left atrial size was severely dilated. Right Atrium: Right atrial size was normal in size. Pericardium: There is no evidence of pericardial effusion. Mitral Valve: The mitral valve is normal in structure. Normal mobility of the mitral valve leaflets. Moderate mitral annular calcification. Mild mitral valve regurgitation. No evidence of mitral valve stenosis. Tricuspid Valve: The tricuspid valve is normal in structure. Tricuspid valve regurgitation is  trivial. No evidence of tricuspid stenosis. Aortic Valve: The aortic valve is tricuspid. Aortic valve regurgitation is not visualized. Mild aortic valve sclerosis is present, with no evidence of aortic valve stenosis. Aortic valve mean gradient measures 7.0 mmHg. Aortic valve peak gradient measures 11.0 mmHg. Aortic valve area, by VTI measures 1.28 cm. Pulmonic Valve: The pulmonic valve was normal in structure. Pulmonic valve regurgitation is trivial. No evidence of pulmonic stenosis. Aorta: Aortic dilatation noted. There is mild dilatation of the ascending aorta measuring 38 mm. Venous: The inferior vena cava is dilated in size with greater than 50% respiratory variability, suggesting right atrial pressure of 8 mmHg. IAS/Shunts: No atrial level shunt detected by color flow Doppler.  LEFT VENTRICLE PLAX 2D LVIDd:         5.00 cm LVIDs:         3.40 cm LV PW:         1.40 cm LV IVS:        1.10 cm LVOT diam:     2.00 cm LV SV:         54 LV SV Index:   25 LVOT Area:     3.14 cm  RIGHT VENTRICLE          IVC RV Basal diam:  2.80 cm  IVC diam: 2.70 cm TAPSE (M-mode): 1.2 cm LEFT ATRIUM              Index       RIGHT ATRIUM           Index LA diam:        5.10 cm  2.37 cm/m  RA Area:     19.70 cm LA Vol (A2C):   101.0 ml 46.92 ml/m RA Volume:  47.90 ml  22.25 ml/m LA Vol (A4C):   112.0 ml 52.03 ml/m LA Biplane Vol: 107.0 ml 49.71 ml/m  AORTIC VALVE AV Area (Vmax):    1.46 cm AV Area (Vmean):   1.34 cm AV Area (VTI):     1.28 cm AV Vmax:           166.00 cm/s AV Vmean:          126.000 cm/s AV VTI:            0.421 m AV Peak Grad:      11.0 mmHg AV Mean Grad:      7.0 mmHg LVOT Vmax:         77.20 cm/s LVOT Vmean:        53.800 cm/s LVOT VTI:          0.172 m LVOT/AV VTI ratio: 0.41  AORTA Ao Root diam: 4.00 cm Ao Asc diam:  3.80 cm MITRAL VALVE                TRICUSPID VALVE MV Area (PHT): 3.85 cm     TR Peak grad:   32.5 mmHg MV Decel Time: 197 msec     TR Vmax:        285.00 cm/s MV E velocity: 153.00  cm/s MV A velocity: 72.60 cm/s   SHUNTS MV E/A ratio:  2.11         Systemic VTI:  0.17 m                             Systemic Diam: 2.00 cm Fransico Him MD Electronically signed by Fransico Him MD Signature Date/Time: 03/24/2020/1:28:25 PM    Final     Assessment and Plan:   Chayah Mckee is a 79 y.o. y/o female with positive stool occult blood test and iron deficiency anemia here for follow-up  Assessment and Plan: Patient remains high risk for endoscopic procedures at this time given her recent fall, subdural hematoma, and therefore patient was not cleared by pulmonology or cardiology, please see their notes.  They do not recommend colonoscopy if it is not urgent and at this time, patient's hemoglobin is stable, she is not having any active bleeding and therefore procedure is not urgent and risks of procedure would outweigh benefits at this time in the setting of her acute issues  Patient should follow-up with pulmonary and cardiology as they recommended post hospital discharge and procedures can be done once she is further medically optimized and cleared by above providers  If patient has any signs of active bleeding, I have asked her to notify us and she verbalized understanding  Patient is on iron replacement and is compliant with it.  Follow Up Instructions:    I discussed the assessment and treatment plan with the patient. The patient was provided an opportunity to ask questions and all were answered. The patient agreed with the plan and demonstrated an understanding of the instructions.   The patient was advised to call back or seek an in-person evaluation if the symptoms worsen or if the condition fails to improve as anticipated.  I provided 12 minutes of non-face-to-face time during this encounter. Additional time was spent in reviewing patient's chart, placing orders etc.   [name], MD  Speech recognition software was used to dictate this note.

## 2020-04-19 ENCOUNTER — Ambulatory Visit (INDEPENDENT_AMBULATORY_CARE_PROVIDER_SITE_OTHER): Payer: Medicare Other | Admitting: Physician Assistant

## 2020-04-19 ENCOUNTER — Encounter: Payer: Self-pay | Admitting: Physician Assistant

## 2020-04-19 ENCOUNTER — Other Ambulatory Visit: Payer: Self-pay

## 2020-04-19 VITALS — BP 118/62 | HR 76 | Temp 98.0°F | Wt 241.0 lb

## 2020-04-19 DIAGNOSIS — N184 Chronic kidney disease, stage 4 (severe): Secondary | ICD-10-CM

## 2020-04-19 DIAGNOSIS — Z794 Long term (current) use of insulin: Secondary | ICD-10-CM

## 2020-04-19 DIAGNOSIS — I10 Essential (primary) hypertension: Secondary | ICD-10-CM

## 2020-04-19 DIAGNOSIS — E1159 Type 2 diabetes mellitus with other circulatory complications: Secondary | ICD-10-CM | POA: Diagnosis not present

## 2020-04-19 DIAGNOSIS — E1122 Type 2 diabetes mellitus with diabetic chronic kidney disease: Secondary | ICD-10-CM | POA: Diagnosis not present

## 2020-04-19 DIAGNOSIS — S065X9A Traumatic subdural hemorrhage with loss of consciousness of unspecified duration, initial encounter: Secondary | ICD-10-CM

## 2020-04-19 DIAGNOSIS — I482 Chronic atrial fibrillation, unspecified: Secondary | ICD-10-CM | POA: Diagnosis not present

## 2020-04-19 DIAGNOSIS — S065XAA Traumatic subdural hemorrhage with loss of consciousness status unknown, initial encounter: Secondary | ICD-10-CM

## 2020-04-19 MED ORDER — LOSARTAN POTASSIUM 50 MG PO TABS: 50.0000 mg | ORAL_TABLET | Freq: Every day | ORAL | 3 refills | Status: DC

## 2020-04-19 MED ORDER — WARFARIN SODIUM 3 MG PO TABS: ORAL_TABLET | ORAL | 1 refills | Status: DC

## 2020-04-19 MED ORDER — WARFARIN SODIUM 4 MG PO TABS: ORAL_TABLET | ORAL | 1 refills | Status: DC

## 2020-04-19 NOTE — Progress Notes (Signed)
Established patient visit   Patient: Ann Flynn   DOB: 07/27/1941   79 y.o. Female  MRN: [id number] Visit Date: 04/19/2020  Today's healthcare provider: Trinna Post, PA-C   Chief Complaint  Patient presents with  . Diabetes  . Hyperlipidemia  . Hypertension  I,Drew Herman M Eusevio Schriver,acting as a scribe for [employer], PA-C.,have documented all relevant documentation on the behalf of Trinna Post, PA-C,as directed by  Trinna Post, PA-C while in the presence of Trinna Post, PA-C.  Subjective    HPI   In the interim, patient fell and was hospitalized on 03/23/2020 with subdural hematoma. She reports since being discharged she followed up with a neurosurgeon who reimaged her head and noted the hematoma was resolving. She was resumed on her warfarin therapy.   Diabetes Mellitus Type II, follow-up  Lab Results  Component Value Date   HGBA1C 7.9 (H) 03/24/2020   HGBA1C 7.3 (A) 01/18/2020   HGBA1C 7.3 (A) 07/06/2019   Last seen for diabetes 3 months ago.  Management since then includes continuing the same treatment. She reports good compliance with treatment. She is not having side effects.   Home blood sugar records:   Episodes of hypoglycemia? No    Current insulin regiment:none  Most Recent Eye Exam: 03/24/2020  --------------------------------------------------------------------------------------------------- Hypertension, follow-up  BP Readings from Last 3 Encounters:  04/19/20 118/62  03/24/20 112/67  03/05/20 (!) 97/56   Wt Readings from Last 3 Encounters:  04/19/20 241 lb (109.3 kg)  03/24/20 244 lb (110.7 kg)  03/05/20 238 lb (108 kg)     She was last seen for hypertension 3 months ago.  BP at that visit was 128/68. Management since that visit includes continue current medication. However, she did see her nephrologist earlier in the month and he decreased her amlodipine to 2.5 mg daily. She forgot to tell him she was already stopped  taking amlodipine.  She reports good compliance with treatment. She is not having side effects.  She is not exercising. She is adherent to low salt diet.   Outside blood pressures are .  She does not smoke.  Use of agents associated with hypertension: none.   --------------------------------------------------------------------------------------------------- Lipid/Cholesterol, follow-up  Last Lipid Panel: Lab Results  Component Value Date   CHOL 104 01/18/2020   LDLCALC 46 01/18/2020   HDL 43 01/18/2020   TRIG 68 01/18/2020    She was last seen for this 3 months ago.  Management since that visit includes no changes.  She reports good compliance with treatment. She is not having side effects.   Symptoms: No appetite changes No foot ulcerations  No chest pain No chest pressure/discomfort  No dyspnea No orthopnea  No fatigue No lower extremity edema  No palpitations No paroxysmal nocturnal dyspnea  No nausea No numbness or tingling of extremity  No polydipsia No polyuria  No speech difficulty No syncope   She is following a Regular diet. Current exercise: housecleaning and no regular exercise  Last metabolic panel Lab Results  Component Value Date   GLUCOSE 225 (H) 03/24/2020   NA 139 03/24/2020   K 4.1 03/24/2020   BUN 77 (H) 03/24/2020   CREATININE 2.44 (H) 03/24/2020   GFRNONAA 18 (L) 03/24/2020   GFRAA 21 (L) 03/24/2020   CALCIUM 8.9 03/24/2020   AST 16 01/27/2020   ALT 12 01/27/2020   The ASCVD Risk score (Goff DC Jr., et al., 2013) failed to calculate for the following reasons:  The patient has a prior MI or stroke diagnosis  ---------------------------------------------------------------------------------------------------  CKD IV: Patient reports this is worsening. She reports that if the time comes, she does not want to do dialysis.   CMP Latest Ref Rng & Units 03/24/2020 03/23/2020 01/27/2020  Glucose 70 - 99 mg/dL 225(H) 209(H) 155(H)  BUN 8 - 23  mg/dL 77(H) 81(H) 59(H)  Creatinine 0.44 - 1.00 mg/dL 2.44(H) 2.67(H) 2.13(H)  Sodium 135 - 145 mmol/L 139 140 142  Potassium 3.5 - 5.1 mmol/L 4.1 4.3 5.2(H)  Chloride 98 - 111 mmol/L 106 103 105  CO2 22 - 32 mmol/L _0 Calcium 8.9 - 10.3 mg/dL 8.9 9.0 8.7(L)  Total Protein 6.5 - 8.1 g/dL - - 6.0(L)  Total Bilirubin 0.3 - 1.2 mg/dL - - 0.5  Alkaline Phos 38 - 126 U/L - - 56  AST 15 - 41 U/L - - 16  ALT 0 - 44 U/L - - 12       Medications: Outpatient Medications Prior to Visit  Medication Sig  . acetaminophen (TYLENOL) 500 MG tablet Take 650 mg by mouth 2 (two) times daily.   [name] ambrisentan (LETAIRIS) 5 MG tablet Take 5 mg by mouth at bedtime.  [name] atorvastatin (LIPITOR) 80 MG tablet TAKE 1 TABLET BY MOUTH DAILY  . BD PEN NEEDLE NANO U/F 32G X 4 MM MISC USE TO INJECT ONCE DAILY AS DIRECTED  . Cyanocobalamin (B-12 PO) Take 1 tablet by mouth daily.  . FEROSUL 325 (65 Fe) MG tablet TAKE 1 TABLET BY MOUTH 3 TIMES DAILY WITH MEALS  . JANUVIA 25 MG tablet TAKE ONE TABLET BY MOUTH EVERY DAY  . metoprolol tartrate (LOPRESSOR) 25 MG tablet TAKE 1 TABLET BY MOUTH TWICE DAILY  . omeprazole (PRILOSEC) 40 MG capsule TAKE 1 CAPSULE BY MOUTH ONCE DAILY  . ONETOUCH ULTRA test strip USE AS DIRECTED  . tadalafil, PAH, (ADCIRCA) 20 MG tablet Take 2 tablets (40 mg total) by mouth at bedtime.  . torsemide (DEMADEX) 20 MG tablet Take 2 tablets (40 mg total) by mouth daily. (Patient taking differently: Take 20 mg by mouth 2 (two) times daily. )  . TOUJEO SOLOSTAR 300 UNIT/ML Solostar Pen INJECT 50 UNITS INTO THE SKIN DAILY (Patient taking differently: at bedtime. )  . venlafaxine (EFFEXOR) 75 MG tablet TAKE ONE TABLET EVERY DAY (Patient taking differently: every other day. )  . VITAMIN D PO Take 2,000 Units by mouth daily.   . [DISCONTINUED] losartan (COZAAR) 100 MG tablet TAKE ONE TABLET EVERY DAY (Patient taking differently: at bedtime. )  . [DISCONTINUED] warfarin (COUMADIN) 3 MG tablet TAKE 1  TABLET BY MOUTH ON MONDAY WEDNESDAY AND FRIDAY ALONG WITH 4MG ON THE REMAINING DAYS (Patient taking differently: TAKE 3 TABLET BY MOUTH ON Tuesday, Thursday, Saturday 4MG ON THE REMAINING DAYS)  . [DISCONTINUED] warfarin (COUMADIN) 4 MG tablet Take 4 mg by mouth daily. Tuesday, Thursday, Saturday and Sunday  . [DISCONTINUED] amLODipine (NORVASC) 5 MG tablet Take 1 tablet by mouth daily. (Patient not taking: Reported on 04/19/2020)   No facility-administered medications prior to visit.    Review of Systems  Constitutional: Negative.   Respiratory: Negative.   Cardiovascular: Negative.   Hematological: Negative.       Objective    BP 118/62 (BP Location: Left Arm, Patient Position: Sitting, Cuff Size: Normal)   Pulse 76   Temp 98 F (36.7 C) (Oral)   Wt 241 lb (109.3 kg)   SpO2 96%   BMI  40.10 kg/m    Physical Exam Constitutional:      Appearance: Normal appearance.  Cardiovascular:     Rate and Rhythm: Normal rate and regular rhythm.     Heart sounds: Normal heart sounds.  Pulmonary:     Effort: Pulmonary effort is normal.     Breath sounds: Normal breath sounds.  Skin:    General: Skin is warm and dry.  Neurological:     Mental Status: She is alert and oriented to person, place, and time. Mental status is at baseline.  Psychiatric:        Mood and Affect: Mood normal.        Behavior: Behavior normal.       No results found for any visits on 04/19/20.  Assessment & Plan    1. Atrial fibrillation, chronic (HCC)  - warfarin (COUMADIN) 4 MG tablet; Take 4 mg on Sun, Mon, Tues, Thurs, Friday.  Dispense: 30 tablet; Refill: 1 - warfarin (COUMADIN) 3 MG tablet; Take 3 mg on Wed and Saturday  Dispense: 30 tablet; Refill: 1  2. Type 2 diabetes mellitus with stage 4 chronic kidney disease, with long-term current use of insulin (HCC) Llast A1c 7.9%. Patient is chronically ill and frail with multiple commodities including CHF, pulmonary HTN, chronic rspiratory failure.  Currently on insulin and januvia. Kidney function precludes SGLT2. Previously on trulicity which she reports is unaffordable. May revisit this in the future. Concerned about increasing insulin and/or adding glipizide for falls. Will recheck A1c at next visit, I think 7.9% is acceptable for now.   Continue current medications UTD on vaccines, eye exam, foot exam On ACEi On Statin Discussed diet and exercise F/u in 3 months   3. Subdural hematoma (HCC)  Resolving, has continued warfarin.  4. Hypertension associated with diabetes ([place])  Decrease losartan in order to meet blood pressure goals as stated by nephrologist. Counseled patient that she does not have to do dialysis. Offered palliative care consult, patient declines at this point but may consider in the future.   - losartan (COZAAR) 50 MG tablet; Take 1 tablet (50 mg total) by mouth daily.  Dispense: 90 tablet; Refill: 3    No follow-ups on file.      ITrinna Post, PA-C, have reviewed all documentation for this visit. The documentation on 05/03/20 for the exam, diagnosis, procedures, and orders are all accurate and complete.  The entirety of the information documented in the History of Present Illness, Review of Systems and Physical Exam were personally obtained by me. Portions of this information were initially documented by [facility] and reviewed by me for thoroughness and accuracy.     Paulene Floor  [facility] [phone] (phone) [phone] (fax)  [place]

## 2020-04-20 ENCOUNTER — Other Ambulatory Visit: Payer: Self-pay | Admitting: Physician Assistant

## 2020-04-20 DIAGNOSIS — J90 Pleural effusion, not elsewhere classified: Secondary | ICD-10-CM

## 2020-04-20 DIAGNOSIS — M7989 Other specified soft tissue disorders: Secondary | ICD-10-CM

## 2020-04-20 IMAGING — CR DG CHEST 2V
2 series · 2 of 2 positions shown · non-contrast
Comparison: CT 06/28/2019

CLINICAL DATA: 78-year-old female with a history pulmonary
hypertension

EXAM:
CHEST - 2 VIEW

[chest pa]
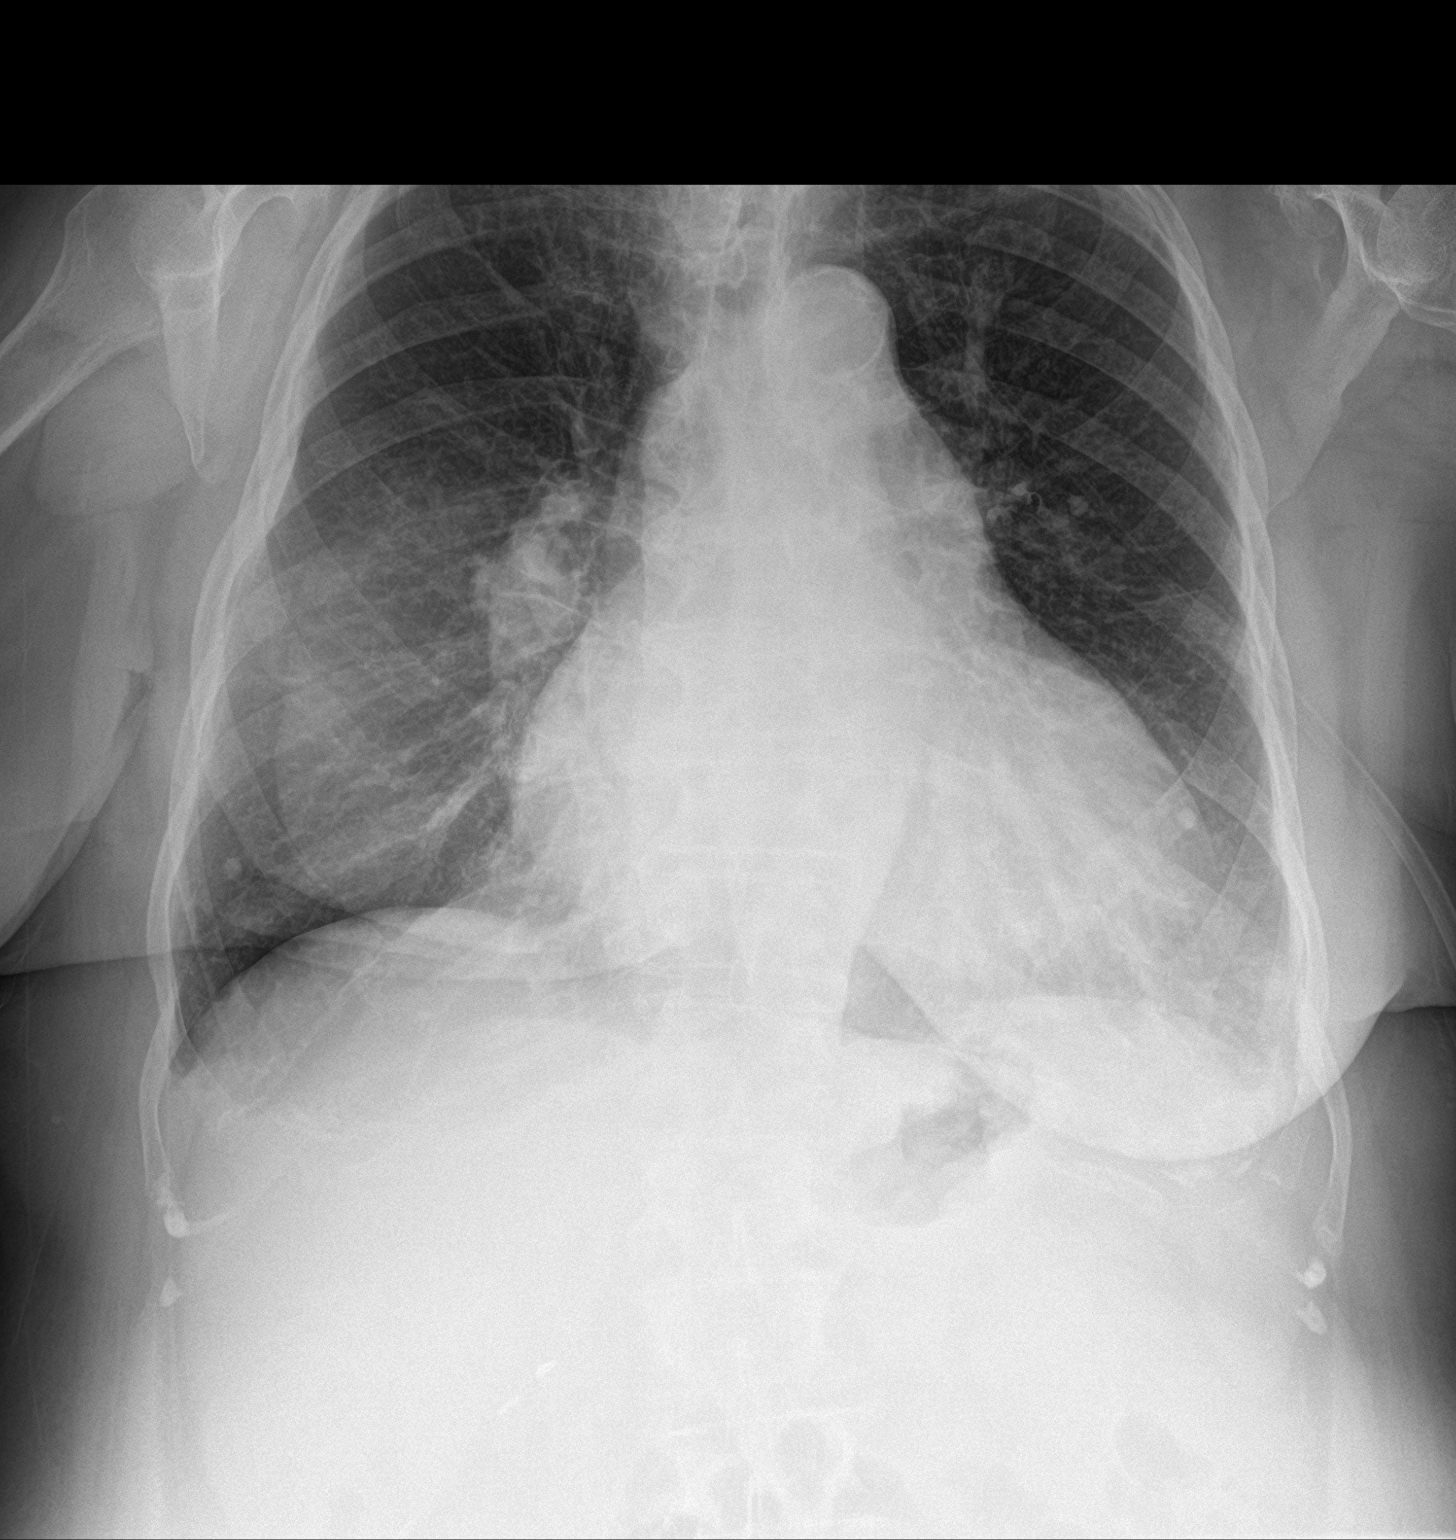

[chest lat]
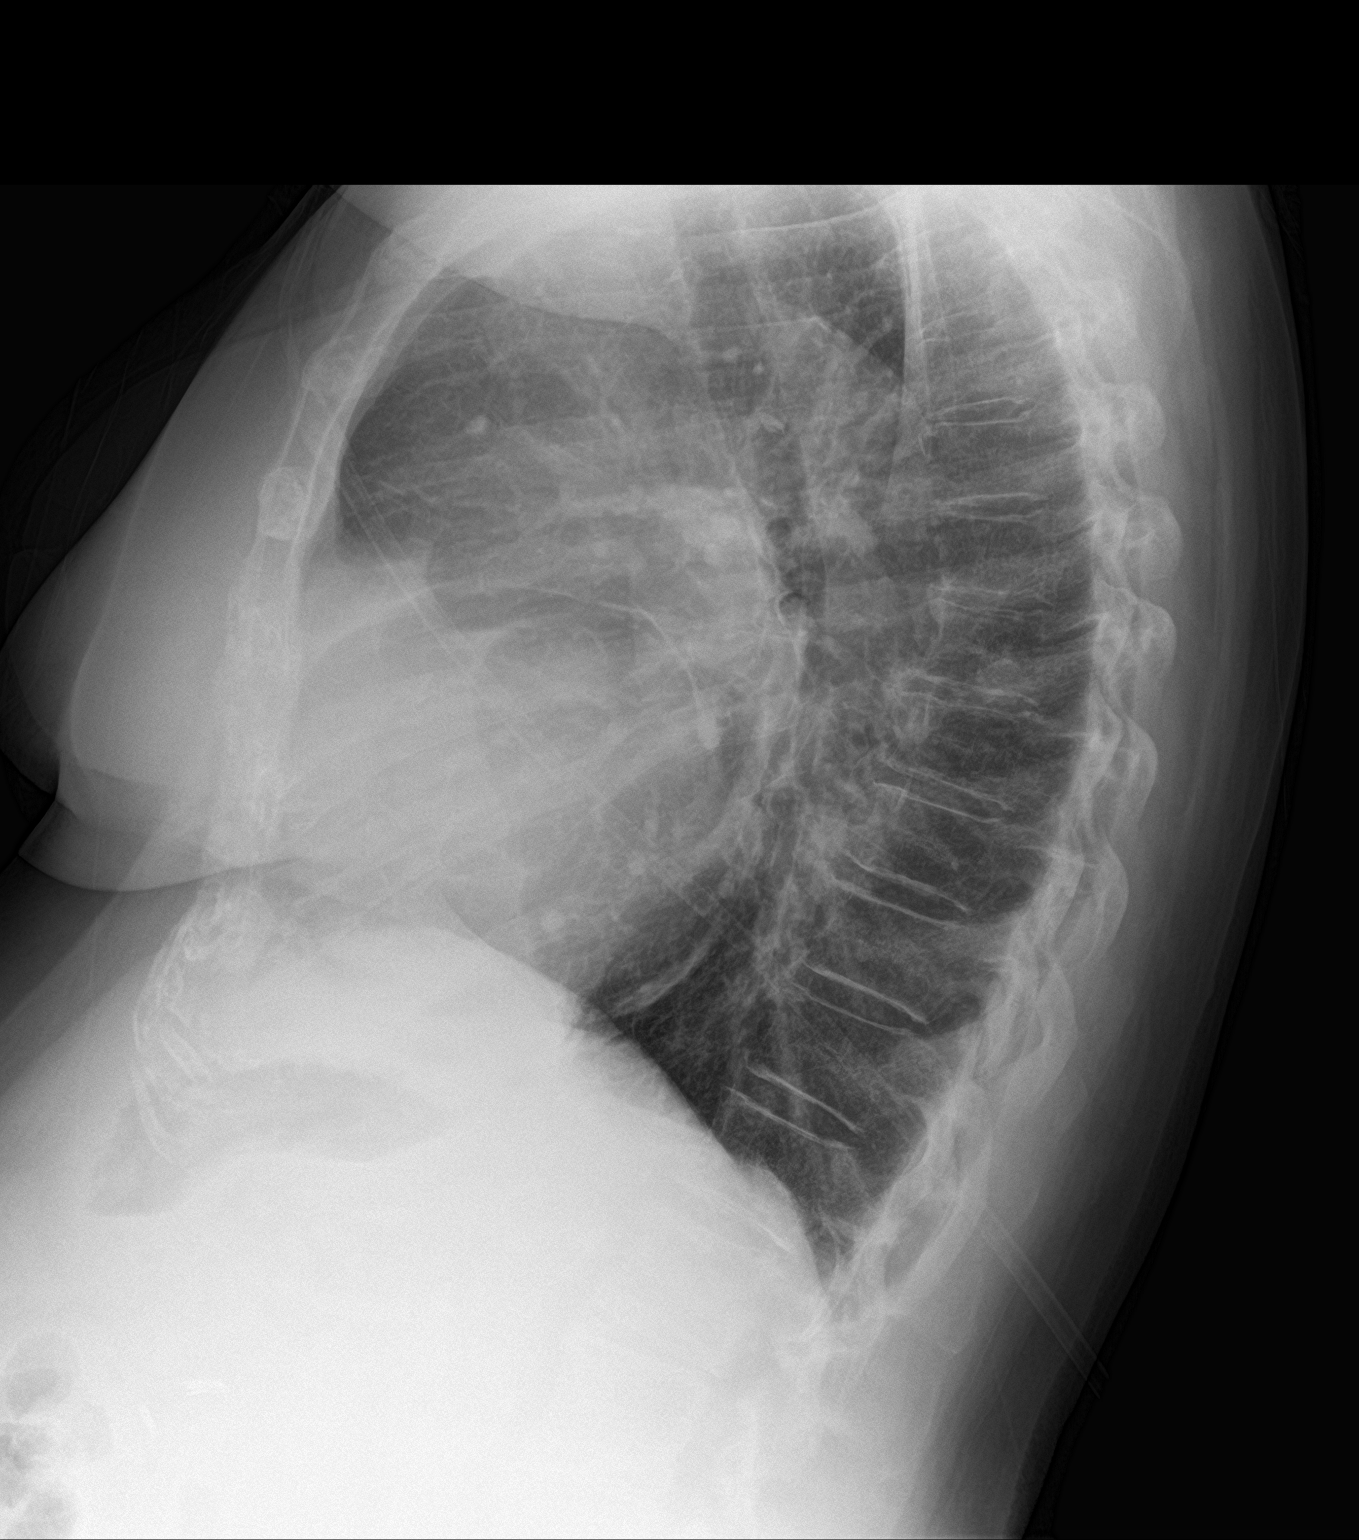

[2 of 2 positions shown; findings below may reference images not displayed]

FINDINGS: Cardiomediastinal silhouette enlarged. No interlobular septal
thickening.

No pneumothorax. No pleural effusion. No confluent airspace disease.

Surgical changes of right mastectomy.  Calcifications of the aorta.
IMPRESSION: Chronic lung changes without evidence of acute cardiopulmonary
disease.

Cardiomegaly

## 2020-05-02 ENCOUNTER — Ambulatory Visit: Payer: Medicare Other

## 2020-05-02 NOTE — Progress Notes (Signed)
Advanced Heart Failure Clinic Note   Date:  05/02/2020   ID:  Ann Flynn, DOB [birth year], MRN [id number]  Location: Home  Provider location:  Advanced Heart Failure Clinic Type of Visit: Established patient  PCP:  Trinna Post, PA-C  Cardiologist:  Rockey Situ Primary HF: Ann Flynn  Chief Complaint: PAH follow-up   History of Present Illness:  [name] is a 79 year old woman with h/o morbid obesity, DM, OSA on CPAP, CKD 4, PAD, CVA, carotid surgery in 2010, pulmonary embolism (2009) and PAH referred by Dr.Gollan for further evaluation of her Ann Flynn.   She recently moved from [place], [place] to be near her family as things were getting harder. Lives by herself in a townhome.  Reports DVT/PE in 2009 and has been on coumadin since. Was diagnosed with PAH in 2016 at [facility] in [place]. Right and left heart cath at that time reported as nonobstructive CAD and severe PAH with PAP 103/19 (51) with PCWP 5. (see results below) Was told she had idiopathic PAH and started on Letairis and cialis. She weighed 255 pounds at the time.   Never smoked but was exposed to 2ndhand smoke. Previouslyon portable oxygen at 5L when she was in [place], but the portable O2 was not covered. Now "goes without oxygen", only at nightthat she wear oxygen with CPAP. Denies h/o CTD.   Has seen Dr. Mortimer Fries in Pulmonary and had PFTs. Unable to do 6MW.  PFTs 2/10  FEV1 1.92 (57%) FVC 2.57 (58%) DLCO 61%  I saw her for the first time in 11/20: Felt to have primarily WHO Group III PH. Echo and VQ ordered.   - Echo 07/07/19: EF 55-60% RV mildly dilated with norma function. No RVSP calculated - VQ 07/07/19: No PE  RHC  07/29/19 RA = 8 RV = 81/14 PA =  80/18 (42) PCW =17 Fick cardiac output/index = 9.25/4.25 PVR = 2.70 WU FA sat = 98% PA sat = 75%, 75% High SVC 72%  Here for routine f/u. Previously switched from lasix to torsemide. At last visit I switched Januvia to Jardiance  but switched back due to low GFR.   Had a syncopal episode in 8/21. CT head showed small 2 mm subdural hematoma. Echocardiogram was obtained, echocardiogram showed EF 55 to 60%, moderate left ventricular hypertrophy, moderately elevated pulmonary artery systolic pressure. Amlodipine stopped  Outpatient monitor was ordered and showed atrial flutter continuously (100% burden) with rates ranging from 50 to 133 (average 85 bpm). F/u CT scan 8/23 SDH resolved  Returns for routine f/u. Says recently she has been having spells of SOB. On Tues had presyncopal episode. SBP in the 60s. Still on torsemide 40 daily.   Previous cardiac studies:    12/16/2014 right and left heart catheterization with nitric oxide inhalation, Pressure data resting: -Mean right atrial pressure 2 Right ventricule 017/4 and diastolic -Pulmonary artery 103/19, mean 51. Pulmonary artery wedge mean 5 -Ascending thoracic aorta 111/53, mean 75. -Fick cardiac output 4.2 L. -Pulmonary resistance 12.25 Woods units. With nitric oxide inhalation -Pulmonary artery 89/29, mean 42. -Right ventricle 94/4 and diastolic. -Right atrial 2. -Fick cardiac output 4.94 L. -Pulmonary resistance 15.91 Woods units.   Ann Flynn denies symptoms worrisome for COVID 19.   Past Medical History:  Diagnosis Date  . A-fib ([place])   . Anemia   . Chronic kidney disease   . Diabetes mellitus without complication ([place])   . Hypertension   . Pulmonary hypertension ([place])  Past Surgical History:  Procedure Laterality Date  . ABDOMINAL HYSTERECTOMY  1997  . ABDOMINAL HYSTERECTOMY    . cartiod artery surgery  2010   per patient   . CHOLECYSTECTOMY  10/19/2009  . masectomy    . MASTECTOMY  1994  . RIGHT HEART CATH N/A 07/29/2019   Procedure: RIGHT HEART CATH;  Surgeon: Jolaine Artist, MD;  Location: [place] [facility];  Service: Cardiovascular;  Laterality: N/A;     Current Outpatient Medications  Medication Sig Dispense  Refill  . acetaminophen (TYLENOL) 500 MG tablet Take 650 mg by mouth 2 (two) times daily.     [name] ambrisentan (LETAIRIS) 5 MG tablet Take 5 mg by mouth at bedtime.    [name] atorvastatin (LIPITOR) 80 MG tablet TAKE 1 TABLET BY MOUTH DAILY 90 tablet 2  . BD PEN NEEDLE NANO U/F 32G X 4 MM MISC USE TO INJECT ONCE DAILY AS DIRECTED 100 each 2  . Cyanocobalamin (B-12 PO) Take 1 tablet by mouth daily.    . FEROSUL 325 (65 Fe) MG tablet TAKE 1 TABLET BY MOUTH 3 TIMES DAILY WITH MEALS 90 tablet 0  . JANUVIA 25 MG tablet TAKE ONE TABLET BY MOUTH EVERY DAY 90 tablet 0  . losartan (COZAAR) 50 MG tablet Take 1 tablet (50 mg total) by mouth daily. 90 tablet 3  . metoprolol tartrate (LOPRESSOR) 25 MG tablet TAKE 1 TABLET BY MOUTH TWICE DAILY 180 tablet 1  . omeprazole (PRILOSEC) 40 MG capsule TAKE 1 CAPSULE BY MOUTH ONCE DAILY 90 capsule 1  . ONETOUCH ULTRA test strip USE AS DIRECTED 100 each 1  . tadalafil, PAH, (ADCIRCA) 20 MG tablet Take 2 tablets (40 mg total) by mouth at bedtime. 180 tablet 3  . torsemide (DEMADEX) 20 MG tablet TAKE 2 TABLETS BY MOUTH DAILY 180 tablet 0  . TOUJEO SOLOSTAR 300 UNIT/ML Solostar Pen INJECT 50 UNITS INTO THE SKIN DAILY 4.5 mL 2  . venlafaxine (EFFEXOR) 75 MG tablet TAKE ONE TABLET EVERY DAY (Patient taking differently: every other day. ) 90 tablet 0  . VITAMIN D PO Take 2,000 Units by mouth daily.     [name] warfarin (COUMADIN) 3 MG tablet Take 3 mg on Wed and Saturday 30 tablet 1  . warfarin (COUMADIN) 4 MG tablet Take 4 mg on Sun, Mon, Tues, Thurs, Friday. 30 tablet 1   No current facility-administered medications for this encounter.    Allergies:   Penicillins, Sulfa antibiotics, Penicillins, and Sulfa antibiotics   Social History:  The patient  reports that she has never smoked. She has never used smokeless tobacco. She reports that she does not drink alcohol and does not use drugs.   Family History:  The patient's family history includes CAD in her maternal grandmother and  mother; Heart disease in her father.   ROS:  Please see the history of present illness.   All other systems are personally reviewed and negative.   Vitals:   05/03/20 1145  BP: 105/70  Pulse: 66  SpO2: 97%  Weight: 110.3 kg   Orthostatitics SBP 118-> 110  Exam:   General:  Obese woman wearing O2. No resp difficulty HEENT: normal Neck: supple. no JVD. Carotids 2+ bilat; no bruits. No lymphadenopathy or thryomegaly appreciated. Cor: PMI nondisplaced. Regular rate & rhythm. No rubs, gallops or murmurs. Lungs: clear decreased throguhout  Abdomen: obese soft, nontender, nondistended. No hepatosplenomegaly. No bruits or masses. Good bowel sounds. Extremities: no cyanosis, clubbing, rash, edema Neuro: alert &  orientedx3, cranial nerves grossly intact. moves all 4 extremities w/o difficulty. Affect pleasant  Recent Labs: 01/27/2020: ALT 12; B Natriuretic Peptide 450.6 03/24/2020: BUN 77; Creatinine, Ser 2.44; Hemoglobin 9.9; Platelets 201; Potassium 4.1; Sodium 139  Personally reviewed   Wt Readings from Last 3 Encounters:  04/19/20 109.3 kg (241 lb)  03/24/20 110.7 kg (244 lb)  03/05/20 108 kg (238 lb)    ECG: AF 7 Personally reviewed  ASSESSMENT AND PLAN:  1. Pulmonary HTN (severe) with cor pulmonale - suspect this is really WHO Group 3 disease due to OSA/OHS but given markedly elevated PVR may also have component of WHO Group I & IV - cath results from 12/20 - PA pressures now down 25% from previous. PVR low.  - will continue tadalafil 40 and letairis 5 (unable to tolerate higher dose of letairis). With low PVR would not add selxipag - PFTs with significant restrictive lung physiology - Echo 07/07/19: EF 55-60% RV mildly dilated with norma function. No RVSP calculated. BNP 549 - Echo 8/21 EF 55-60% RV moderately HK RVSP 47mHG - VQ 07/07/19: No PE - continue O2 supplementation and CPAP - Remains tenuous NYHA III-IIIb. Unclear if syncope related to volume depletion or worsening  PAH/cor pulmonale - Decrease torsemide to 20 daily. Can use extra as needed. - Repeat RHC - Wear compression hose.   2. Chronic diastolic HF - Volume status appears low. Plan as above  3. DM2 - given HF and CKD 3b with volume overload would like to get her back on SGLT2i as possible. Now approved to GFR down to 20. Will see what RHC says first.   4. CKD 4 - last creatinine 1.9 - consider SGLT2i as above  5. Nonobstructive CAD - No s/s angina - Continue statin. Off ASA with coumadin  6. Morbid obesity - stressed need for weight loss as above  7. H/o PE 2009 - on warfarin.  - VQ 11/20 negative - Does not want to switch to DOAC due to cost  8. HTN - BP soft/ + orthostasis  - Plan as above  9. Paroxysmal AFL - new diagnosis - rate controlled.  - Zio shows continuous AFL with rate control - was off coumadin due to SDH but now back on.  - Will arrange for DC-CV at next visit after RHC  Total time spent 35 minutes. Over half that time spent discussing above.    Signed, DGlori Bickers MD  05/02/2020 11:38 PM  Advanced [facility] [address] [phone](office) [phone])

## 2020-05-02 NOTE — H&P (View-Only) (Signed)
Advanced Heart Failure Clinic Note   Date:  05/02/2020   ID:  Ann Flynn, DOB [birth year], MRN [id number]  Location: Home  Provider location: [facility] Type of Visit: Established patient  PCP:  Trinna Post, PA-C  Cardiologist:  Rockey Situ Primary HF: Ann Flynn  Chief Complaint: PAH follow-up   History of Present Illness:  [name] is a 79 year old woman with h/o morbid obesity, DM, OSA on CPAP, CKD 4, PAD, CVA, carotid surgery in 2010, pulmonary embolism (2009) and PAH referred by Dr.Gollan for further evaluation of her [place].   She recently moved from [place], [place] to be near her family as things were getting harder. Lives by herself in a townhome.  Reports DVT/PE in 2009 and has been on coumadin since. Was diagnosed with PAH in 2016 at [facility] in [place]. Right and left heart cath at that time reported as nonobstructive CAD and severe PAH with PAP 103/19 (51) with PCWP 5. (see results below) Was told she had idiopathic PAH and started on Letairis and cialis. She weighed 255 pounds at the time.   Never smoked but was exposed to 2ndhand smoke. Previouslyon portable oxygen at 5L when she was in [place], but the portable O2 was not covered. Now "goes without oxygen", only at nightthat she wear oxygen with CPAP. Denies h/o CTD.   Has seen Dr. Mortimer Fries in Pulmonary and had PFTs. Unable to do 6MW.  PFTs 2/10  FEV1 1.92 (57%) FVC 2.57 (58%) DLCO 61%  I saw her for the first time in 11/20: Felt to have primarily WHO Group III PH. Echo and VQ ordered.   - Echo 07/07/19: EF 55-60% RV mildly dilated with norma function. No RVSP calculated - VQ 07/07/19: No PE  RHC  07/29/19 RA = 8 RV = 81/14 PA =  80/18 (42) PCW =17 Fick cardiac output/index = 9.25/4.25 PVR = 2.70 WU FA sat = 98% PA sat = 75%, 75% High SVC 72%  Here for routine f/u. Previously switched from lasix to torsemide. At last visit I switched Januvia to Jardiance  but switched back due to low GFR.   Had a syncopal episode in 8/21. CT head showed small 2 mm subdural hematoma. Echocardiogram was obtained, echocardiogram showed EF 55 to 60%, moderate left ventricular hypertrophy, moderately elevated pulmonary artery systolic pressure. Amlodipine stopped  Outpatient monitor was ordered and showed atrial flutter continuously (100% burden) with rates ranging from 50 to 133 (average 85 bpm). F/u CT scan 8/23 SDH resolved  Returns for routine f/u. Says recently she has been having spells of SOB. On Tues had presyncopal episode. SBP in the 60s. Still on torsemide 40 daily.   Previous cardiac studies:    12/16/2014 right and left heart catheterization with nitric oxide inhalation, Pressure data resting: -Mean right atrial pressure 2 Right ventricule 017/4 and diastolic -Pulmonary artery 103/19, mean 51. Pulmonary artery wedge mean 5 -Ascending thoracic aorta 111/53, mean 75. -Fick cardiac output 4.2 L. -Pulmonary resistance 12.25 Woods units. With nitric oxide inhalation -Pulmonary artery 89/29, mean 42. -Right ventricle 94/4 and diastolic. -Right atrial 2. -Fick cardiac output 4.94 L. -Pulmonary resistance 15.91 Woods units.   Macall Mccroskey denies symptoms worrisome for COVID 19.   Past Medical History:  Diagnosis Date  . A-fib ([place])   . Anemia   . Chronic kidney disease   . Diabetes mellitus without complication ([place])   . Hypertension   . Pulmonary hypertension ([place])  Past Surgical History:  Procedure Laterality Date  . ABDOMINAL HYSTERECTOMY  1997  . ABDOMINAL HYSTERECTOMY    . cartiod artery surgery  2010   per patient   . CHOLECYSTECTOMY  10/19/2009  . masectomy    . MASTECTOMY  1994  . RIGHT HEART CATH N/A 07/29/2019   Procedure: RIGHT HEART CATH;  Surgeon: Jolaine Artist, MD;  Location: [facility];  Service: Cardiovascular;  Laterality: N/A;     Current Outpatient Medications  Medication Sig Dispense  Refill  . acetaminophen (TYLENOL) 500 MG tablet Take 650 mg by mouth 2 (two) times daily.     [name] ambrisentan (LETAIRIS) 5 MG tablet Take 5 mg by mouth at bedtime.    [name] atorvastatin (LIPITOR) 80 MG tablet TAKE 1 TABLET BY MOUTH DAILY 90 tablet 2  . BD PEN NEEDLE NANO U/F 32G X 4 MM MISC USE TO INJECT ONCE DAILY AS DIRECTED 100 each 2  . Cyanocobalamin (B-12 PO) Take 1 tablet by mouth daily.    . FEROSUL 325 (65 Fe) MG tablet TAKE 1 TABLET BY MOUTH 3 TIMES DAILY WITH MEALS 90 tablet 0  . JANUVIA 25 MG tablet TAKE ONE TABLET BY MOUTH EVERY DAY 90 tablet 0  . losartan (COZAAR) 50 MG tablet Take 1 tablet (50 mg total) by mouth daily. 90 tablet 3  . metoprolol tartrate (LOPRESSOR) 25 MG tablet TAKE 1 TABLET BY MOUTH TWICE DAILY 180 tablet 1  . omeprazole (PRILOSEC) 40 MG capsule TAKE 1 CAPSULE BY MOUTH ONCE DAILY 90 capsule 1  . ONETOUCH ULTRA test strip USE AS DIRECTED 100 each 1  . tadalafil, PAH, (ADCIRCA) 20 MG tablet Take 2 tablets (40 mg total) by mouth at bedtime. 180 tablet 3  . torsemide (DEMADEX) 20 MG tablet TAKE 2 TABLETS BY MOUTH DAILY 180 tablet 0  . TOUJEO SOLOSTAR 300 UNIT/ML Solostar Pen INJECT 50 UNITS INTO THE SKIN DAILY 4.5 mL 2  . venlafaxine (EFFEXOR) 75 MG tablet TAKE ONE TABLET EVERY DAY (Patient taking differently: every other day. ) 90 tablet 0  . VITAMIN D PO Take 2,000 Units by mouth daily.     [name] warfarin (COUMADIN) 3 MG tablet Take 3 mg on Wed and Saturday 30 tablet 1  . warfarin (COUMADIN) 4 MG tablet Take 4 mg on Sun, Mon, Tues, Thurs, Friday. 30 tablet 1   No current facility-administered medications for this encounter.    Allergies:   Penicillins, Sulfa antibiotics, Penicillins, and Sulfa antibiotics   Social History:  The patient  reports that she has never smoked. She has never used smokeless tobacco. She reports that she does not drink alcohol and does not use drugs.   Family History:  The patient's family history includes CAD in her maternal grandmother and  mother; Heart disease in her father.   ROS:  Please see the history of present illness.   All other systems are personally reviewed and negative.   Vitals:   05/03/20 1145  BP: 105/70  Pulse: 66  SpO2: 97%  Weight: 110.3 kg   Orthostatitics SBP 118-> 110  Exam:   General:  Obese woman wearing O2. No resp difficulty HEENT: normal Neck: supple. no JVD. Carotids 2+ bilat; no bruits. No lymphadenopathy or thryomegaly appreciated. Cor: PMI nondisplaced. Regular rate & rhythm. No rubs, gallops or murmurs. Lungs: clear decreased throguhout  Abdomen: obese soft, nontender, nondistended. No hepatosplenomegaly. No bruits or masses. Good bowel sounds. Extremities: no cyanosis, clubbing, rash, edema Neuro: alert &  orientedx3, cranial nerves grossly intact. moves all 4 extremities w/o difficulty. Affect pleasant  Recent Labs: 01/27/2020: ALT 12; B Natriuretic Peptide 450.6 03/24/2020: BUN 77; Creatinine, Ser 2.44; Hemoglobin 9.9; Platelets 201; Potassium 4.1; Sodium 139  Personally reviewed   Wt Readings from Last 3 Encounters:  04/19/20 109.3 kg (241 lb)  03/24/20 110.7 kg (244 lb)  03/05/20 108 kg (238 lb)    ECG: AF 7 Personally reviewed  ASSESSMENT AND PLAN:  1. Pulmonary HTN (severe) with cor pulmonale - suspect this is really WHO Group 3 disease due to OSA/OHS but given markedly elevated PVR may also have component of WHO Group I & IV - cath results from 12/20 - PA pressures now down 25% from previous. PVR low.  - will continue tadalafil 40 and letairis 5 (unable to tolerate higher dose of letairis). With low PVR would not add selxipag - PFTs with significant restrictive lung physiology - Echo 07/07/19: EF 55-60% RV mildly dilated with norma function. No RVSP calculated. BNP 549 - Echo 8/21 EF 55-60% RV moderately HK RVSP 47mHG - VQ 07/07/19: No PE - continue O2 supplementation and CPAP - Remains tenuous NYHA III-IIIb. Unclear if syncope related to volume depletion or worsening  PAH/cor pulmonale - Decrease torsemide to 20 daily. Can use extra as needed. - Repeat RHC - Wear compression hose.   2. Chronic diastolic HF - Volume status appears low. Plan as above  3. DM2 - given HF and CKD 3b with volume overload would like to get her back on SGLT2i as possible. Now approved to GFR down to 20. Will see what RHC says first.   4. CKD 4 - last creatinine 1.9 - consider SGLT2i as above  5. Nonobstructive CAD - No s/s angina - Continue statin. Off ASA with coumadin  6. Morbid obesity - stressed need for weight loss as above  7. H/o PE 2009 - on warfarin.  - VQ 11/20 negative - Does not want to switch to DOAC due to cost  8. HTN - BP soft/ + orthostasis  - Plan as above  9. Paroxysmal AFL - new diagnosis - rate controlled.  - Zio shows continuous AFL with rate control - was off coumadin due to SDH but now back on.  - Will arrange for DC-CV at next visit after RHC  Total time spent 35 minutes. Over half that time spent discussing above.    Signed, DGlori Bickers MD  05/02/2020 11:38 PM  Advanced [facility] [address] and [phone](office) [phone])

## 2020-05-03 ENCOUNTER — Other Ambulatory Visit: Payer: Self-pay

## 2020-05-03 ENCOUNTER — Ambulatory Visit (HOSPITAL_COMMUNITY)
Admission: RE | Admit: 2020-05-03 | Discharge: 2020-05-03 | Disposition: A | Discharge status: Home / Self Care | Payer: Medicare Other | Source: Ambulatory Visit | Attending: Internal Medicine | Admitting: Internal Medicine

## 2020-05-03 ENCOUNTER — Other Ambulatory Visit (HOSPITAL_COMMUNITY): Payer: Self-pay

## 2020-05-03 VITALS — BP 105/70 | HR 66 | Wt 243.2 lb

## 2020-05-03 DIAGNOSIS — Z8673 Personal history of transient ischemic attack (TIA), and cerebral infarction without residual deficits: Secondary | ICD-10-CM | POA: Diagnosis not present

## 2020-05-03 DIAGNOSIS — Z7901 Long term (current) use of anticoagulants: Secondary | ICD-10-CM | POA: Insufficient documentation

## 2020-05-03 DIAGNOSIS — Z79899 Other long term (current) drug therapy: Secondary | ICD-10-CM | POA: Insufficient documentation

## 2020-05-03 DIAGNOSIS — N184 Chronic kidney disease, stage 4 (severe): Secondary | ICD-10-CM | POA: Insufficient documentation

## 2020-05-03 DIAGNOSIS — I4891 Unspecified atrial fibrillation: Secondary | ICD-10-CM | POA: Diagnosis not present

## 2020-05-03 DIAGNOSIS — I272 Pulmonary hypertension, unspecified: Secondary | ICD-10-CM

## 2020-05-03 DIAGNOSIS — Z86711 Personal history of pulmonary embolism: Secondary | ICD-10-CM | POA: Diagnosis not present

## 2020-05-03 DIAGNOSIS — M7989 Other specified soft tissue disorders: Secondary | ICD-10-CM

## 2020-05-03 DIAGNOSIS — I5032 Chronic diastolic (congestive) heart failure: Secondary | ICD-10-CM | POA: Insufficient documentation

## 2020-05-03 DIAGNOSIS — Z6841 Body Mass Index (BMI) 40.0 and over, adult: Secondary | ICD-10-CM | POA: Diagnosis not present

## 2020-05-03 DIAGNOSIS — I129 Hypertensive chronic kidney disease with stage 1 through stage 4 chronic kidney disease, or unspecified chronic kidney disease: Secondary | ICD-10-CM | POA: Insufficient documentation

## 2020-05-03 DIAGNOSIS — I2510 Atherosclerotic heart disease of native coronary artery without angina pectoris: Secondary | ICD-10-CM | POA: Diagnosis not present

## 2020-05-03 DIAGNOSIS — R55 Syncope and collapse: Secondary | ICD-10-CM

## 2020-05-03 DIAGNOSIS — E1122 Type 2 diabetes mellitus with diabetic chronic kidney disease: Secondary | ICD-10-CM | POA: Insufficient documentation

## 2020-05-03 DIAGNOSIS — Z8249 Family history of ischemic heart disease and other diseases of the circulatory system: Secondary | ICD-10-CM | POA: Insufficient documentation

## 2020-05-03 DIAGNOSIS — I4892 Unspecified atrial flutter: Secondary | ICD-10-CM | POA: Insufficient documentation

## 2020-05-03 DIAGNOSIS — I130 Hypertensive heart and chronic kidney disease with heart failure and stage 1 through stage 4 chronic kidney disease, or unspecified chronic kidney disease: Secondary | ICD-10-CM | POA: Insufficient documentation

## 2020-05-03 DIAGNOSIS — G4733 Obstructive sleep apnea (adult) (pediatric): Secondary | ICD-10-CM | POA: Diagnosis not present

## 2020-05-03 DIAGNOSIS — I482 Chronic atrial fibrillation, unspecified: Secondary | ICD-10-CM

## 2020-05-03 LAB — CBC
HCT: 36.6 % (ref 36.0–46.0)
Hemoglobin: 10.8 g/dL — ABNORMAL LOW (ref 12.0–15.0)
MCH: 29.2 pg (ref 26.0–34.0)
MCHC: 29.5 g/dL — ABNORMAL LOW (ref 30.0–36.0)
MCV: 98.9 fL (ref 80.0–100.0)
Platelets: 232 10*3/uL (ref 150–400)
RBC: 3.7 MIL/uL — ABNORMAL LOW (ref 3.87–5.11)
RDW: 16.7 % — ABNORMAL HIGH (ref 11.5–15.5)
WBC: 5.9 10*3/uL (ref 4.0–10.5)
nRBC: 0 % (ref 0.0–0.2)

## 2020-05-03 LAB — BASIC METABOLIC PANEL
Anion gap: 8 (ref 5–15)
BUN: 41 mg/dL — ABNORMAL HIGH (ref 8–23)
CO2: 24 mmol/L (ref 22–32)
Calcium: 9.3 mg/dL (ref 8.9–10.3)
Chloride: 109 mmol/L (ref 98–111)
Creatinine, Ser: 1.63 mg/dL — ABNORMAL HIGH (ref 0.44–1.00)
GFR calc Af Amer: 35 mL/min — ABNORMAL LOW (ref >60)
GFR calc non Af Amer: 30 mL/min — ABNORMAL LOW (ref >60)
Glucose, Bld: 77 mg/dL (ref 70–99)
Potassium: 5.2 mmol/L — ABNORMAL HIGH (ref 3.5–5.1)
Sodium: 141 mmol/L (ref 135–145)

## 2020-05-03 MED ORDER — TORSEMIDE 20 MG PO TABS: 20.0000 mg | ORAL_TABLET | Freq: Every day | ORAL | 2 refills | Status: DC

## 2020-05-03 NOTE — Addendum Note (Signed)
Encounter addended by: Malena Edman, RN on: 05/03/2020 3:19 PM  Actions taken: Vitals modified

## 2020-05-03 NOTE — Patient Instructions (Signed)
DECREASE Torsemide 92m Daily  Please wear your compression hose daily, place them on as soon as you get up in the morning and remove before you go to bed at night.  Your physician has requested that you have a cardiac catheterization. Cardiac catheterization is used to diagnose and/or treat various heart conditions. Doctors may recommend this procedure for a number of different reasons. The most common reason is to evaluate chest pain. Chest pain can be a symptom of coronary artery disease (CAD), and cardiac catheterization can show whether plaque is narrowing or blocking your hearts arteries. This procedure is also used to evaluate the valves, as well as measure the blood flow and oxygen levels in different parts of your heart. For further information please visit [email or web address] Please follow instruction sheet, as given.  Please follow up with our clinic in 2 MONTHS   If you have any questions or concerns before your next appointment please send u[place]aa message through mRiversideor call our office at [phone]    TO LEAVE A MESSAGE FOR THE NURSE SELECT OPTION 2, PLEASE LEAVE A MESSAGE INCLUDING:  YOUR NAME  DATE OF BIRTH  CALL BACK NUMBER  REASON FOR CALL**this is important as we prioritize the call backs  YOU WILL RECEIVE A CALL BACK THE SAME DAY AS LONG AS YOU CALL BEFORE 4:00 PM  At [facility] you and your health needs are our priority. As part of our continuing mission to provide you with exceptional heart care, we have created designated Provider Care Teams. These Care Teams include your primary Cardiologist (physician) and Advanced Practice Providers (APPs- Physician Assistants and Nurse Practitioners) who all work together to provide you with the care you need, when you need it.   You may see any of the following providers on your designated Care Team at your next follow up:  Dr DGlori Bickers Dr DHaynes Kerns NP  BLyda Jester PUtah LAudry Riles PharmD   Please be sure to bring in all your medications bottles to every appointment.    CARDIAC CATHETERIZATION INSTRUCTION  You are scheduled for a Cardiac Catheterization on Friday, September 24 with Dr. DGlori Bickers  [facility]. [facility] [address] [id number] 10:00 AM (This time is two hours before your procedure to ensure your preparation). Free valet parking service is available.   Special note: Every effort is made to have your procedure done on time. Please understand that emergencies sometimes delay scheduled procedures.  2. Diet: Do not eat solid foods after midnight.  The patient may have clear liquids until 5am upon the day of the procedure.  3. [facility]  [facility] AT 10:45AM   [facility]  4. On the morning of your procedure, take any morning medicines NOT listed above.  You may use sips of water.  5. Plan for one night stay--bring personal belongings. 6. Bring a current list of your medications and current insurance cards. 7. You MUST have a responsible person to drive you home. 8. Someone MUST be with you the first 24 hours after you arrive home or your discharge will be delayed. 9. Please wear clothes that are easy to get on and off and wear slip-on shoes.  Thank you for allowing u[place]ato care for you!   -- [facility]

## 2020-05-04 ENCOUNTER — Other Ambulatory Visit: Payer: Self-pay | Admitting: Physician Assistant

## 2020-05-04 DIAGNOSIS — I270 Primary pulmonary hypertension: Secondary | ICD-10-CM

## 2020-05-07 ENCOUNTER — Telehealth (HOSPITAL_COMMUNITY): Payer: Self-pay

## 2020-05-07 NOTE — Telephone Encounter (Signed)
Malena Edman, RN  05/07/2020 4:30 PM EDT Back to Top    Patient advised and verbalized understanding. Lab schd   Malena Edman, RN  05/04/2020 2:49 PM EDT     lmtrc

## 2020-05-07 NOTE — Telephone Encounter (Signed)
-----  Message from Jolaine Artist, MD sent at 05/03/2020  3:36 PM EDT ----- Repeat BMET 2 weeks

## 2020-05-08 ENCOUNTER — Telehealth (INDEPENDENT_AMBULATORY_CARE_PROVIDER_SITE_OTHER): Payer: Medicare Other | Admitting: Gastroenterology

## 2020-05-08 ENCOUNTER — Encounter: Payer: Self-pay | Admitting: Gastroenterology

## 2020-05-08 DIAGNOSIS — D509 Iron deficiency anemia, unspecified: Secondary | ICD-10-CM

## 2020-05-09 ENCOUNTER — Other Ambulatory Visit
Admission: RE | Admit: 2020-05-09 | Discharge: 2020-05-09 | Disposition: A | Discharge status: Home / Self Care | Payer: Medicare Other | Source: Ambulatory Visit | Attending: Internal Medicine | Admitting: Internal Medicine

## 2020-05-09 ENCOUNTER — Other Ambulatory Visit: Payer: Self-pay

## 2020-05-09 ENCOUNTER — Telehealth: Payer: Medicare Other | Admitting: Gastroenterology

## 2020-05-09 ENCOUNTER — Ambulatory Visit (INDEPENDENT_AMBULATORY_CARE_PROVIDER_SITE_OTHER): Payer: Medicare Other

## 2020-05-09 DIAGNOSIS — Z01812 Encounter for preprocedural laboratory examination: Secondary | ICD-10-CM | POA: Insufficient documentation

## 2020-05-09 DIAGNOSIS — Z20822 Contact with and (suspected) exposure to covid-19: Secondary | ICD-10-CM | POA: Diagnosis not present

## 2020-05-09 DIAGNOSIS — I482 Chronic atrial fibrillation, unspecified: Secondary | ICD-10-CM | POA: Diagnosis not present

## 2020-05-09 LAB — POCT INR: INR: 2.7 (ref 2.0–3.0)

## 2020-05-09 LAB — SARS CORONAVIRUS 2 (TAT 6-24 HRS): SARS Coronavirus 2: NEGATIVE

## 2020-05-09 NOTE — Patient Instructions (Signed)
Description   4 mg q d except 3 mg W & Sat Return back to clinic in 4 weeks

## 2020-05-09 NOTE — Progress Notes (Signed)
Ann Antigua, MD [address]  [place]  [place], [place] [address]  Main: [phone]  Fax: [phone]   Primary Care Physician: Paulene Floor  Virtual Visit via Telephone Note  I connected with patient on 05/09/20 at  1:15 PM EDT by telephone and verified that I am speaking with the correct person using two identifiers.   I discussed the limitations, risks, security and privacy concerns of performing an evaluation and management service by telephone and the availability of in person appointments. I also discussed with the patient that there may be a patient responsible charge related to this service. The patient expressed understanding and agreed to proceed.  Location of Patient: Home Location of Provider: Home Persons involved: Patient and provider only during the visit (nursing staff and front desk staff was involved in communicating with the patient prior to the appointment, reviewing medications and checking them in)   History of Present Illness: Chief Complaint  Patient presents with  . IDA    Patient was not able to obtain clearance to have her procedure scheduled.     HPI: Ann Flynn is a 79 y.o. female here for follow-up of iron deficiency anemia and positive FOBT.  Patient was previously not cleared for procedures by cardiology and pulmonology.  Patient denies any active bleeding.  No abdominal pain, nausea or vomiting.  Reports good appetite.  Current Outpatient Medications  Medication Sig Dispense Refill  . acetaminophen (TYLENOL) 500 MG tablet Take 500 mg by mouth 2 (two) times daily.     [name] ambrisentan (LETAIRIS) 5 MG tablet Take 5 mg by mouth at bedtime.    [name] atorvastatin (LIPITOR) 80 MG tablet TAKE 1 TABLET BY MOUTH DAILY (Patient taking differently: Take 80 mg by mouth at bedtime. ) 90 tablet 2  . BD PEN NEEDLE NANO U/F 32G X 4 MM MISC USE TO INJECT ONCE DAILY AS DIRECTED 100 each 2  . Cholecalciferol (VITAMIN D3) 50 MCG (2000 UT)  TABS Take 2,000 Units by mouth in the morning and at bedtime.    . Cyanocobalamin (B-12 PO) Take 25 mcg by mouth in the morning and at bedtime.     . FEROSUL 325 (65 Fe) MG tablet TAKE 1 TABLET BY MOUTH 3 TIMES DAILY WITH MEALS (Patient taking differently: Take 325 mg by mouth 3 (three) times daily with meals. ) 90 tablet 0  . JANUVIA 25 MG tablet TAKE ONE TABLET BY MOUTH EVERY DAY (Patient taking differently: Take 25 mg by mouth daily. ) 90 tablet 0  . losartan (COZAAR) 50 MG tablet Take 1 tablet (50 mg total) by mouth daily. 90 tablet 3  . metoprolol tartrate (LOPRESSOR) 25 MG tablet TAKE 1 TABLET BY MOUTH TWICE DAILY (Patient taking differently: Take 25 mg by mouth 2 (two) times daily. ) 180 tablet 1  . omeprazole (PRILOSEC) 40 MG capsule TAKE 1 CAPSULE BY MOUTH ONCE DAILY (Patient taking differently: Take 40 mg by mouth daily. ) 90 capsule 1  . ONETOUCH ULTRA test strip USE AS DIRECTED 100 each 1  . tadalafil, PAH, (ADCIRCA) 20 MG tablet Take 2 tablets (40 mg total) by mouth at bedtime. 180 tablet 3  . torsemide (DEMADEX) 20 MG tablet Take 1 tablet (20 mg total) by mouth daily. 90 tablet 2  . TOUJEO SOLOSTAR 300 UNIT/ML Solostar Pen INJECT 50 UNITS INTO THE SKIN DAILY (Patient taking differently: Inject 50 Units into the skin at bedtime. ) 4.5 mL 2  . venlafaxine (EFFEXOR)  75 MG tablet Take 75 mg by mouth every other day.    . warfarin (COUMADIN) 3 MG tablet Take 3 mg on Wed and Saturday (Patient taking differently: Take 3 mg by mouth See admin instructions. Take 3 mg on Wed and Saturday) 30 tablet 1  . warfarin (COUMADIN) 4 MG tablet Take 4 mg on Sun, Mon, Tues, Thurs, Friday. (Patient taking differently: Take 4 mg by mouth See admin instructions. Take 4 mg on Sun, Mon, Tues, Thurs, Friday.) 30 tablet 1   No current facility-administered medications for this visit.    Allergies as of 05/08/2020 - Review Complete 05/08/2020  Allergen Reaction Noted  . Penicillins Swelling and Rash 03/28/2019    . Sulfa antibiotics Rash 03/28/2019    Review of Systems:    All systems reviewed and negative except where noted in HPI.   Observations/Objective:  Labs: CMP     Component Value Date/Time   NA 141 05/03/2020 1328   NA 141 01/18/2020 1155   K 5.2 (H) 05/03/2020 1328   CL 109 05/03/2020 1328   CO2 24 05/03/2020 1328   GLUCOSE 77 05/03/2020 1328   BUN 41 (H) 05/03/2020 1328   BUN 51 (H) 01/18/2020 1155   CREATININE 1.63 (H) 05/03/2020 1328   CALCIUM 9.3 05/03/2020 1328   PROT 6.0 (L) 01/27/2020 1150   PROT 6.1 01/18/2020 1155   ALBUMIN 3.5 01/27/2020 1150   ALBUMIN 4.0 01/18/2020 1155   AST 16 01/27/2020 1150   ALT 12 01/27/2020 1150   ALKPHOS 56 01/27/2020 1150   BILITOT 0.5 01/27/2020 1150   BILITOT 0.3 01/18/2020 1155   GFRNONAA 30 (L) 05/03/2020 1328   GFRAA 35 (L) 05/03/2020 1328   Lab Results  Component Value Date   WBC 5.9 05/03/2020   HGB 10.8 (L) 05/03/2020   HCT 36.6 05/03/2020   MCV 98.9 05/03/2020   PLT 232 05/03/2020    Imaging Studies: No results found.  Assessment and Plan:   [name] is a 79 y.o. y/o female here for follow-up of iron deficiency anemia and positive FOBT  Assessment and Plan: Patient continues to follow closely with cardiology We did discuss need for procedures again in detail, and alternative less invasive testing such as CT colonoscopy given ongoing cardiology treatments.  However, patient definitively and clearly stated that she does not want any further testing in regard to this, including noninvasive set testing such as imaging as she states that if a malignancy was found, she would not want to undergo treatment.  I did discuss that an imaging study would help evaluate if iron deficiency anemia is perhaps due to treatable lesions, however, patient refuses and it is not interested in further testing for iron deficiency anemia and positive FOBT at this time.  Patient can thus follow-up with primary care provider, and if  things change or if they have any further questions, patient can be referred to [place] again at that time  Patient was encouraged to call us with any questions or concerns  Follow Up Instructions:    I discussed the assessment and treatment plan with the patient. The patient was provided an opportunity to ask questions and all were answered. The patient agreed with the plan and demonstrated an understanding of the instructions.   The patient was advised to call back or seek an in-person evaluation if the symptoms worsen or if the condition fails to improve as anticipated.  I provided 15 minutes of non-face-to-face time during this encounter. Additional time  was spent in reviewing patient's chart, placing orders etc.   [name], MD  Speech recognition software was used to dictate this note.

## 2020-05-11 ENCOUNTER — Ambulatory Visit (HOSPITAL_COMMUNITY)
Admission: RE | Admit: 2020-05-11 | Discharge: 2020-05-11 | Disposition: A | Discharge status: Home / Self Care | Payer: Medicare Other | Attending: Internal Medicine | Admitting: Internal Medicine

## 2020-05-11 ENCOUNTER — Other Ambulatory Visit: Payer: Self-pay

## 2020-05-11 ENCOUNTER — Encounter (HOSPITAL_COMMUNITY)
Admission: RE | Disposition: A | Discharge status: Home / Self Care | Payer: Self-pay | Source: Home / Self Care | Attending: Internal Medicine

## 2020-05-11 DIAGNOSIS — E1122 Type 2 diabetes mellitus with diabetic chronic kidney disease: Secondary | ICD-10-CM | POA: Insufficient documentation

## 2020-05-11 DIAGNOSIS — N184 Chronic kidney disease, stage 4 (severe): Secondary | ICD-10-CM | POA: Insufficient documentation

## 2020-05-11 DIAGNOSIS — Z6839 Body mass index (BMI) 39.0-39.9, adult: Secondary | ICD-10-CM | POA: Insufficient documentation

## 2020-05-11 DIAGNOSIS — Z7901 Long term (current) use of anticoagulants: Secondary | ICD-10-CM | POA: Insufficient documentation

## 2020-05-11 DIAGNOSIS — I2721 Secondary pulmonary arterial hypertension: Secondary | ICD-10-CM | POA: Diagnosis not present

## 2020-05-11 DIAGNOSIS — Z794 Long term (current) use of insulin: Secondary | ICD-10-CM | POA: Diagnosis not present

## 2020-05-11 DIAGNOSIS — I5032 Chronic diastolic (congestive) heart failure: Secondary | ICD-10-CM | POA: Diagnosis not present

## 2020-05-11 DIAGNOSIS — I2510 Atherosclerotic heart disease of native coronary artery without angina pectoris: Secondary | ICD-10-CM | POA: Insufficient documentation

## 2020-05-11 DIAGNOSIS — D631 Anemia in chronic kidney disease: Secondary | ICD-10-CM | POA: Diagnosis not present

## 2020-05-11 DIAGNOSIS — G4733 Obstructive sleep apnea (adult) (pediatric): Secondary | ICD-10-CM | POA: Insufficient documentation

## 2020-05-11 DIAGNOSIS — Z8673 Personal history of transient ischemic attack (TIA), and cerebral infarction without residual deficits: Secondary | ICD-10-CM | POA: Insufficient documentation

## 2020-05-11 DIAGNOSIS — Z86711 Personal history of pulmonary embolism: Secondary | ICD-10-CM | POA: Insufficient documentation

## 2020-05-11 DIAGNOSIS — Z79899 Other long term (current) drug therapy: Secondary | ICD-10-CM | POA: Diagnosis not present

## 2020-05-11 DIAGNOSIS — E1151 Type 2 diabetes mellitus with diabetic peripheral angiopathy without gangrene: Secondary | ICD-10-CM | POA: Diagnosis not present

## 2020-05-11 DIAGNOSIS — I4891 Unspecified atrial fibrillation: Secondary | ICD-10-CM | POA: Diagnosis not present

## 2020-05-11 DIAGNOSIS — I272 Pulmonary hypertension, unspecified: Secondary | ICD-10-CM

## 2020-05-11 DIAGNOSIS — Z882 Allergy status to sulfonamides status: Secondary | ICD-10-CM | POA: Diagnosis not present

## 2020-05-11 DIAGNOSIS — Z880 Allergy status to penicillin: Secondary | ICD-10-CM | POA: Diagnosis not present

## 2020-05-11 DIAGNOSIS — I130 Hypertensive heart and chronic kidney disease with heart failure and stage 1 through stage 4 chronic kidney disease, or unspecified chronic kidney disease: Secondary | ICD-10-CM | POA: Diagnosis not present

## 2020-05-11 HISTORY — PX: RIGHT HEART CATH: CATH118263

## 2020-05-11 LAB — POCT I-STAT EG7
Acid-Base Excess: 0 mmol/L (ref 0.0–2.0)
Acid-Base Excess: 0 mmol/L (ref 0.0–2.0)
Acid-Base Excess: 1 mmol/L (ref 0.0–2.0)
Bicarbonate: 25.5 mmol/L (ref 20.0–28.0)
Bicarbonate: 26.9 mmol/L (ref 20.0–28.0)
Bicarbonate: 27.9 mmol/L (ref 20.0–28.0)
Calcium, Ion: 1.08 mmol/L — ABNORMAL LOW (ref 1.15–1.40)
Calcium, Ion: 1.18 mmol/L (ref 1.15–1.40)
Calcium, Ion: 1.23 mmol/L (ref 1.15–1.40)
HCT: 26 % — ABNORMAL LOW (ref 36.0–46.0)
HCT: 30 % — ABNORMAL LOW (ref 36.0–46.0)
HCT: 31 % — ABNORMAL LOW (ref 36.0–46.0)
Hemoglobin: 10.2 g/dL — ABNORMAL LOW (ref 12.0–15.0)
Hemoglobin: 10.5 g/dL — ABNORMAL LOW (ref 12.0–15.0)
Hemoglobin: 8.8 g/dL — ABNORMAL LOW (ref 12.0–15.0)
O2 Saturation: 70 %
O2 Saturation: 75 %
O2 Saturation: 78 %
Potassium: 3.7 mmol/L (ref 3.5–5.1)
Potassium: 4.1 mmol/L (ref 3.5–5.1)
Potassium: 4.2 mmol/L (ref 3.5–5.1)
Sodium: 146 mmol/L — ABNORMAL HIGH (ref 135–145)
Sodium: 147 mmol/L — ABNORMAL HIGH (ref 135–145)
Sodium: 148 mmol/L — ABNORMAL HIGH (ref 135–145)
TCO2: 27 mmol/L (ref 22–32)
TCO2: 28 mmol/L (ref 22–32)
TCO2: 29 mmol/L (ref 22–32)
pCO2, Ven: 47.4 mmHg (ref 44.0–60.0)
pCO2, Ven: 51.3 mmHg (ref 44.0–60.0)
pCO2, Ven: 51.6 mmHg (ref 44.0–60.0)
pH, Ven: 7.327 (ref 7.250–7.430)
pH, Ven: 7.339 (ref 7.250–7.430)
pH, Ven: 7.34 (ref 7.250–7.430)
pO2, Ven: 40 mmHg (ref 32.0–45.0)
pO2, Ven: 43 mmHg (ref 32.0–45.0)
pO2, Ven: 45 mmHg (ref 32.0–45.0)

## 2020-05-11 LAB — GLUCOSE, CAPILLARY: Glucose-Capillary: 97 mg/dL (ref 70–99)

## 2020-05-11 LAB — PROTIME-INR
INR: 2.5 — ABNORMAL HIGH (ref 0.8–1.2)
Prothrombin Time: 26.3 seconds — ABNORMAL HIGH (ref 11.4–15.2)

## 2020-05-11 SURGERY — RIGHT HEART CATH
Anesthesia: LOCAL

## 2020-05-11 MED ORDER — LIDOCAINE HCL (PF) 1 % IJ SOLN
INTRAMUSCULAR | Status: DC | PRN
  Administered 2020-05-11: 2 mL via SUBCUTANEOUS

## 2020-05-11 MED ORDER — SODIUM CHLORIDE 0.9 % IV SOLN: 250.0000 mL | INTRAVENOUS | Status: DC | PRN

## 2020-05-11 MED ORDER — HEPARIN (PORCINE) IN NACL 1000-0.9 UT/500ML-% IV SOLN
INTRAVENOUS | Status: DC | PRN
  Administered 2020-05-11: 500 mL

## 2020-05-11 MED ORDER — ASPIRIN 81 MG PO CHEW
CHEWABLE_TABLET | ORAL | Status: AC
  Administered 2020-05-11: 81 mg via ORAL
  Filled 2020-05-11: qty 1

## 2020-05-11 MED ORDER — SODIUM CHLORIDE 0.9 % IV SOLN: INTRAVENOUS | Status: DC

## 2020-05-11 MED ORDER — LABETALOL HCL 5 MG/ML IV SOLN: 10.0000 mg | INTRAVENOUS | Status: DC | PRN

## 2020-05-11 MED ORDER — SODIUM CHLORIDE 0.9% FLUSH: 3.0000 mL | INTRAVENOUS | Status: DC | PRN

## 2020-05-11 MED ORDER — SODIUM CHLORIDE 0.9% FLUSH: 3.0000 mL | Freq: Two times a day (BID) | INTRAVENOUS | Status: DC

## 2020-05-11 MED ORDER — HEPARIN (PORCINE) IN NACL 1000-0.9 UT/500ML-% IV SOLN
INTRAVENOUS | Status: AC
  Filled 2020-05-11: qty 500

## 2020-05-11 MED ORDER — ACETAMINOPHEN 325 MG PO TABS: 650.0000 mg | ORAL_TABLET | ORAL | Status: DC | PRN

## 2020-05-11 MED ORDER — ASPIRIN 81 MG PO CHEW: 81.0000 mg | CHEWABLE_TABLET | ORAL | Status: AC

## 2020-05-11 MED ORDER — ONDANSETRON HCL 4 MG/2ML IJ SOLN: 4.0000 mg | Freq: Four times a day (QID) | INTRAMUSCULAR | Status: DC | PRN

## 2020-05-11 MED ORDER — LIDOCAINE HCL (PF) 1 % IJ SOLN
INTRAMUSCULAR | Status: AC
  Filled 2020-05-11: qty 30

## 2020-05-11 MED ORDER — HYDRALAZINE HCL 20 MG/ML IJ SOLN: 10.0000 mg | INTRAMUSCULAR | Status: DC | PRN

## 2020-05-11 SURGICAL SUPPLY — 8 items
CATH BALLN WEDGE 5F 110CM (CATHETERS) ×2 IMPLANT
KIT HEART LEFT (KITS) IMPLANT
PACK CARDIAC CATHETERIZATION (CUSTOM PROCEDURE TRAY) ×2 IMPLANT
SHEATH GLIDE SLENDER 4/5FR (SHEATH) ×2 IMPLANT
TRANSDUCER W/STOPCOCK (MISCELLANEOUS) ×2 IMPLANT
TUBING ART PRESS 72  MALE/FEM (TUBING) ×1
TUBING ART PRESS 72 MALE/FEM (TUBING) ×1 IMPLANT
WIRE EMERALD 3MM-J .025X260CM (WIRE) ×2 IMPLANT

## 2020-05-11 NOTE — Interval H&P Note (Signed)
History and Physical Interval Note:  05/11/2020 1:26 PM  Ann Flynn  has presented today for surgery, with the diagnosis of pulmonary hypertention.  The various methods of treatment have been discussed with the patient and family. After consideration of risks, benefits and other options for treatment, the patient has consented to  Procedure(s): RIGHT HEART CATH (N/A) as a surgical intervention.  The patient's history has been reviewed, patient examined, no change in status, stable for surgery.  I have reviewed the patient's chart and labs.  Questions were answered to the patient's satisfaction.     Jayr Lupercio

## 2020-05-11 NOTE — Discharge Instructions (Signed)
Venogram A venogram, or venography, is a procedure that uses an X-ray and dye (contrast) to examine how well the veins work and how blood flows through them. Contrast helps the veins show up on X-rays. A venogram may be done:  To evaluate abnormalities in the vein.  To identify clots within veins, such as deep vein thrombosis (DVT).  To map out the veins that might be needed for another procedure. Tell a health care provider about:  Any allergies you have, especially to medicines, shellfish, iodine, and contrast.  All medicines you are taking, including vitamins, herbs, eye drops, creams, and over-the-counter medicines.  Any problems you or family members have had with anesthetic medicines.  Any blood disorders you have.  Any surgeries you have had and any complications that occurred.  Any medical conditions you have.  Whether you are pregnant, may be pregnant, or are breastfeeding.  Any history of smoking or tobacco use. What are the risks? Generally, this is a safe procedure. However, problems may occur, including:  Infection.  Bleeding.  Blood clots.  Allergic reaction to medicines or contrast.  Damage to other structures or organs.  Kidney problems.  Increased risk of cancer. Being exposed to too much radiation over a lifetime can increase the risk of cancer. The risk is small. What happens before the procedure? Medicines Ask your health care provider about:  Changing or stopping your regular medicines. This is especially important if you are taking diabetes medicines or blood thinners.  Taking medicines such as aspirin and ibuprofen. These medicines can thin your blood. Do not take these medicines unless your health care provider tells you to take them.  Taking over-the-counter medicines, vitamins, herbs, and supplements. General instructions  Follow instructions from your health care provider about eating or drinking restrictions.  You may have blood tests  to check how well your kidneys and liver are working and how well your blood can clot.  Plan to have someone take you home from the hospital or clinic. What happens during the procedure?   An IV will be inserted into one of your veins.  You may be given a medicine to help you relax (sedative).  You will lie down on an X-ray table. The table may be tilted in different directions during the procedure to help the contrast move throughout your body. Safety straps will keep you secure if the table is tilted.  If veins in your arm or leg will be examined, a band may be wrapped around that arm or leg to keep the veins full of blood. This may cause your arm or leg to feel numb.  The contrast will be injected into your IV. You may have a hot, flushed feeling as it moves throughout your body. You may also have a metallic taste in your mouth. Both of these sensations will go away after the test is complete.  You may be asked to lie in different positions or place your legs or arms in different positions.  At the end of the procedure, you may be given IV fluids to help wash or flush the contrast out of your veins.  The IV will be removed, and pressure will be applied to the IV site to prevent bleeding. A bandage (dressing) may be applied to the IV site. The exact procedure may vary among health care providers and hospitals. What can I expect after the procedure?  Your blood pressure, heart rate, breathing rate, and blood oxygen level will be monitored until you  leave the hospital or clinic.  You may be given something to eat and drink.  You may have bruising or mild discomfort in the area where the IV was inserted. Follow these instructions at home: Eating and drinking   Follow instructions from your health care provider about eating or drinking restrictions.  Drink a lot of water for the first several days after the procedure, as directed by your health care provider. This helps to flush the  contrast out of your body. Activity  Rest as told by your health care provider.  Return to your normal activities as told by your health care provider. Ask your health care provider what activities are safe for you.  If you were given a sedative during your procedure, do not drive for 24 hours or until your health care provider approves. General instructions  Check your IV insertion area every day for signs of infection. Check for: ? Redness, swelling, or pain. ? Fluid or blood. ? Warmth. ? Pus or a bad smell.  Take over-the-counter and prescription medicines only as told by your health care provider.  Keep all follow-up visits as told by your health care provider. This is important. Contact a health care provider if:  Your skin becomes itchy or you develop a rash or hives.  You have a fever that does not get better with medicine.  You feel nauseous or you vomit.  You have redness, swelling, or pain around the insertion site.  You have fluid or blood coming from the insertion site.  Your insertion area feels warm to the touch.  You have pus or a bad smell coming from the insertion site. Get help right away if you:  Have shortness of breath or difficulty breathing.  Develop chest pain.  Faint.  Feel very dizzy. These symptoms may represent a serious problem that is an emergency. Do not wait to see if the symptoms will go away. Get medical help right away. Call your local emergency services (911 in the [place] Do not drive yourself to the hospital. Summary  A venogram, or venography, is a procedure that uses an X-ray and contrast dye to check how well the veins work and how blood flows through them.  An IV will be inserted into one of your veins in order to inject the contrast.  During the exam, you will lie on an X-ray table. The table may be tilted in different directions during the procedure to help the contrast move throughout your body. Safety straps will keep you  secure.  After the procedure, you will need to drink a lot of water to help wash or flush the contrast out of your body. This information is not intended to replace advice given to you by your health care provider. Make sure you discuss any questions you have with your health care provider. Document Revised: 03/12/2019 Document Reviewed: 03/12/2019 Elsevier Patient Education  [place].

## 2020-05-14 ENCOUNTER — Encounter (HOSPITAL_COMMUNITY): Payer: Self-pay | Admitting: Internal Medicine

## 2020-05-17 ENCOUNTER — Ambulatory Visit: Payer: Medicare Other | Attending: Internal Medicine

## 2020-05-17 DIAGNOSIS — Z23 Encounter for immunization: Secondary | ICD-10-CM

## 2020-05-17 NOTE — Progress Notes (Signed)
   Covid-19 Vaccination Clinic  Name:  Ann Flynn    MRN: [id number] DOB: 1941-07-08  05/17/2020  [name] was observed post Covid-19 immunization for 30 minutes based on pre-vaccination screening without incident. She was provided with Vaccine Information Sheet and instruction to access the V-Safe system.   [name] was instructed to call 911 with any severe reactions post vaccine: [name] Difficulty breathing  . Swelling of face and throat  . A fast heartbeat  . A bad rash all over body  . Dizziness and weakness

## 2020-05-21 ENCOUNTER — Telehealth: Payer: Medicare Other

## 2020-05-23 ENCOUNTER — Ambulatory Visit (HOSPITAL_COMMUNITY)
Admission: RE | Admit: 2020-05-23 | Discharge: 2020-05-23 | Disposition: A | Discharge status: Home / Self Care | Payer: Medicare Other | Source: Ambulatory Visit | Attending: Internal Medicine | Admitting: Internal Medicine

## 2020-05-23 ENCOUNTER — Other Ambulatory Visit: Payer: Self-pay

## 2020-05-23 DIAGNOSIS — I5032 Chronic diastolic (congestive) heart failure: Secondary | ICD-10-CM | POA: Diagnosis not present

## 2020-05-23 LAB — BASIC METABOLIC PANEL
Anion gap: 10 (ref 5–15)
BUN: 50 mg/dL — ABNORMAL HIGH (ref 8–23)
CO2: 25 mmol/L (ref 22–32)
Calcium: 8.8 mg/dL — ABNORMAL LOW (ref 8.9–10.3)
Chloride: 106 mmol/L (ref 98–111)
Creatinine, Ser: 1.55 mg/dL — ABNORMAL HIGH (ref 0.44–1.00)
GFR calc non Af Amer: 32 mL/min — ABNORMAL LOW (ref >60)
Glucose, Bld: 133 mg/dL — ABNORMAL HIGH (ref 70–99)
Potassium: 4.8 mmol/L (ref 3.5–5.1)
Sodium: 141 mmol/L (ref 135–145)

## 2020-05-23 NOTE — Addendum Note (Signed)
Addended by: Shela Nevin R on: 86/08/422 10:48 AM   Modules accepted: Orders

## 2020-06-01 ENCOUNTER — Other Ambulatory Visit: Payer: Self-pay

## 2020-06-01 ENCOUNTER — Ambulatory Visit: Payer: Medicare Other | Admitting: Pharmacist

## 2020-06-01 DIAGNOSIS — E785 Hyperlipidemia, unspecified: Secondary | ICD-10-CM

## 2020-06-01 DIAGNOSIS — E1122 Type 2 diabetes mellitus with diabetic chronic kidney disease: Secondary | ICD-10-CM

## 2020-06-01 DIAGNOSIS — E1169 Type 2 diabetes mellitus with other specified complication: Secondary | ICD-10-CM

## 2020-06-01 DIAGNOSIS — Z794 Long term (current) use of insulin: Secondary | ICD-10-CM

## 2020-06-01 NOTE — Chronic Care Management (AMB) (Signed)
Chronic Care Management Pharmacy  Name: Tawney Vanorman  MRN: [id number] DOB: September 18, 1940  Chief Complaint/ HPI  Ann Flynn,  79 y.o. , female presents for their Follow-Up CCM visit with the clinical pharmacist via telephone due to COVID-19 Pandemic.  PCP : Trinna Post, PA-C  Their chronic conditions include: HF, HLD, DM  Office Visits:NA  Consult Visit: 10/4 CKD, Singh, BP 123/77 P 60 Wt 253.5 BMI 42.2 GFR 29 improved, stopped NSAIDs, decreased amlodipine?  Medications: Outpatient Encounter Medications as of 06/01/2020  Medication Sig  . acetaminophen (TYLENOL) 500 MG tablet Take 500 mg by mouth 2 (two) times daily.   [name] ambrisentan (LETAIRIS) 5 MG tablet Take 5 mg by mouth at bedtime.  [name] atorvastatin (LIPITOR) 80 MG tablet TAKE 1 TABLET BY MOUTH DAILY (Patient taking differently: Take 80 mg by mouth at bedtime. )  . BD PEN NEEDLE NANO U/F 32G X 4 MM MISC USE TO INJECT ONCE DAILY AS DIRECTED  . Cholecalciferol (VITAMIN D3) 50 MCG (2000 UT) TABS Take 2,000 Units by mouth in the morning and at bedtime.  . Cyanocobalamin (B-12 PO) Take 25 mcg by mouth in the morning and at bedtime.   . FEROSUL 325 (65 Fe) MG tablet TAKE 1 TABLET BY MOUTH 3 TIMES DAILY WITH MEALS (Patient taking differently: Take 325 mg by mouth 3 (three) times daily with meals. )  . JANUVIA 25 MG tablet TAKE ONE TABLET BY MOUTH EVERY DAY (Patient taking differently: Take 25 mg by mouth daily. )  . losartan (COZAAR) 50 MG tablet Take 1 tablet (50 mg total) by mouth daily.  . metoprolol tartrate (LOPRESSOR) 25 MG tablet TAKE 1 TABLET BY MOUTH TWICE DAILY (Patient taking differently: Take 25 mg by mouth 2 (two) times daily. )  . omeprazole (PRILOSEC) 40 MG capsule TAKE 1 CAPSULE BY MOUTH ONCE DAILY (Patient taking differently: Take 40 mg by mouth daily. )  . ONETOUCH ULTRA test strip USE AS DIRECTED  . tadalafil, PAH, (ADCIRCA) 20 MG tablet Take 2 tablets (40 mg total) by mouth at bedtime.  . torsemide (DEMADEX)  20 MG tablet Take 1 tablet (20 mg total) by mouth daily.  [name] SOLOSTAR 300 UNIT/ML Solostar Pen INJECT 50 UNITS INTO THE SKIN DAILY (Patient taking differently: Inject 50 Units into the skin at bedtime. )  . venlafaxine (EFFEXOR) 75 MG tablet Take 75 mg by mouth every other day.  . warfarin (COUMADIN) 3 MG tablet Take 3 mg on Wed and Saturday (Patient taking differently: Take 3 mg by mouth See admin instructions. Take 3 mg on Wed and Saturday)  . warfarin (COUMADIN) 4 MG tablet Take 4 mg on Sun, Mon, Tues, Thurs, Friday. (Patient taking differently: Take 4 mg by mouth See admin instructions. Take 4 mg on Sun, Mon, Tues, Thurs, Friday.)   No facility-administered encounter medications on file as of 06/01/2020.     Financial Resource Strain: High Risk  . Difficulty of Paying Living Expenses: Very hard     Current Diagnosis/Assessment:  Goals Addressed            This Visit's Progress   . Chronic Care Management       CARE PLAN ENTRY (see longitudinal plan of care for additional care plan information)  Current Barriers:  . Chronic Disease Management support, education, and care coordination needs related to Hypertension, Hyperlipidemia, Diabetes, and Chronic Kidney Disease   Hypertension BP Readings from Last 3 Encounters:  01/30/20 120/60  01/27/20 116/60  01/18/20 128/68   .  Pharmacist Clinical Goal(s): o Over the next 90 days, patient will work with PharmD and providers to maintain BP goal <130/80 . Current regimen:  o Amlodipine 23m daily o Metoprolol 254mtwice daily o Losartan 10074maily . Interventions: o None . Patient self care activities - Over the next 90 days, patient will: o Check BP weekly, document, and provide at future appointments o Ensure daily salt intake < 2300 mg/day  Hyperlipidemia Lab Results  Component Value Date/Time   LDLCALC 46 01/18/2020 11:55 AM   . Pharmacist Clinical Goal(s): o Over the next 90 days, patient will work with PharmD  and providers to maintain LDL goal < 70 . Current regimen:  o Lipitor 32m52mily . Interventions: o None . Patient self care activities - Over the next 90 days, patient will: o Take atorvastatin 32mg15mly o Report any unexplained muscle pain to provider or PharmD  Diabetes Lab Results  Component Value Date/Time   HGBA1C 7.3 (A) 01/18/2020 01:12 PM   HGBA1C 7.3 (A) 07/06/2019 09:58 AM   HGBA1C 7.2 (H) 03/28/2019 04:11 PM   . Pharmacist Clinical Goal(s): o Over the next 90 days, patient will work with PharmD and providers to achieve A1c goal <7% . Current regimen:  o Januvia 25mg 48my o Toujeo inject 45 units daily . Interventions: o Ozempic 0.5mg in4mt once weekly o Patient Assistance Program . Patient self care activities - Over the next 90 days, patient will: o Check blood sugar once daily, document, and provide at future appointments o Contact provider with any episodes of hypoglycemia  Medication management . Pharmacist Clinical Goal(s): o Over the next 90 days, patient will work with PharmD and providers to achieve optimal medication adherence . Current pharmacy: [facility] . Interventions o Comprehensive medication review performed. o Use [facility] . Patient self care activities - Over the next 90 days, patient will: o Focus on medication adherence by switch to [facility] for delivery, synchronization, pill packs and a single point of contact [facility] as prescribed o Report any questions or concerns to PharmD and/or provider(s)  Initial goal documentation       Diabetes   Recent Relevant Labs: Lab Results  Component Value Date/Time   HGBA1C 7.9 (H) 03/24/2020 06:15 AM   HGBA1C 7.3 (A) 01/18/2020 01:12 PM   HGBA1C 7.3 (A) 07/06/2019 09:58 AM   HGBA1C 7.2 (H) 03/28/2019 04:11 PM     Checking BG: Daily  Recent pre-meal BG readings: 95 Patient has failed these meds in past: NA Patient  is currently controlled on the following medications: Januvia 25mg da9m Toujeo 50u qhs  Last diabetic Foot exam:  Lab Results  Component Value Date/Time   HMDIABEYEEXA Retinopathy (A) 09/28/2019 12:00 AM    Last diabetic Eye exam: No results found for: HMDIABFOOTEX   We discussed:  Toujeo 45 - 50 39pending on diet, counseled on long acting Has hypoglycemia, focus on avoiding Feels low at 95 to 100 Modified goal due to frailty < 8%  Plan  Ozempic PAP Continue current medications   Medication Management   We discussed:  9/24 cardiac catheterization Had blood in stool Effexor every other day, wants to taper off Hard time with Iron three times daily with meals is a problem  Plan  Utilize [facility] for medication synchronization, packaging and delivery  Verbal consent obtained for [facility] enhanced pharmacy services (medication synchronization, adherence packaging, delivery coordination). A medication sync plan was created to  allow patient to get all medications delivered once every 30 to 90 days per patient preference. Patient understands they have freedom to choose pharmacy and clinical pharmacist will coordinate care between all prescribers and [facility].  Follow up: 3 month phone visit  Milus Height, PharmD, [place], [facility] [phone]

## 2020-06-03 NOTE — Patient Instructions (Signed)
Visit Information  Goals Addressed            This Visit's Progress   . Chronic Care Management       CARE PLAN ENTRY (see longitudinal plan of care for additional care plan information)  Current Barriers:  . Chronic Disease Management support, education, and care coordination needs related to Hypertension, Hyperlipidemia, Diabetes, and Chronic Kidney Disease   Hypertension BP Readings from Last 3 Encounters:  01/30/20 120/60  01/27/20 116/60  01/18/20 128/68   . Pharmacist Clinical Goal(s): o Over the next 90 days, patient will work with PharmD and providers to maintain BP goal <130/80 . Current regimen:  o Amlodipine 32m daily o Metoprolol 218mtwice daily o Losartan 10016maily . Interventions: o None . Patient self care activities - Over the next 90 days, patient will: o Check BP weekly, document, and provide at future appointments o Ensure daily salt intake < 2300 mg/day  Hyperlipidemia Lab Results  Component Value Date/Time   LDLCALC 46 01/18/2020 11:55 AM   . Pharmacist Clinical Goal(s): o Over the next 90 days, patient will work with PharmD and providers to maintain LDL goal < 70 . Current regimen:  o Lipitor 87m93mily . Interventions: o None . Patient self care activities - Over the next 90 days, patient will: o Take atorvastatin 87mg43mly o Report any unexplained muscle pain to provider or PharmD  Diabetes Lab Results  Component Value Date/Time   HGBA1C 7.3 (A) 01/18/2020 01:12 PM   HGBA1C 7.3 (A) 07/06/2019 09:58 AM   HGBA1C 7.2 (H) 03/28/2019 04:11 PM   . Pharmacist Clinical Goal(s): o Over the next 90 days, patient will work with PharmD and providers to achieve A1c goal <7% . Current regimen:  o Januvia 25mg 60my o Toujeo inject 45 units daily . Interventions: o Ozempic 0.5mg in57mt once weekly o Patient Assistance Program . Patient self care activities - Over the next 90 days, patient will: o Check blood sugar once daily, document, and  provide at future appointments o Contact provider with any episodes of hypoglycemia  Medication management . Pharmacist Clinical Goal(s): o Over the next 90 days, patient will work with PharmD and providers to achieve optimal medication adherence . Current pharmacy: [facility] . Interventions o Comprehensive medication review performed. o Use [facility] . Patient self care activities - Over the next 90 days, patient will: o Focus on medication adherence by switch to [facility] for delivery, synchronization, pill packs and a [facility] as prescribed o Report any questions or concerns to PharmD and/or provider(s)  Initial goal documentation        Print copy of patient instructions provided.   Telephone follow up appointment with pharmacy team member scheduled for: 3 months  Krisanne Lich HanMilus HeightD, BCGP, [phone]

## 2020-06-05 ENCOUNTER — Telehealth: Payer: Self-pay | Admitting: Pharmacist

## 2020-06-05 NOTE — Progress Notes (Signed)
Application for Ozempic patient assistance mailed to patient. Patient to bring it to [facility] for provider signature when complete.

## 2020-06-06 ENCOUNTER — Ambulatory Visit: Payer: Medicare Other

## 2020-06-12 ENCOUNTER — Telehealth: Payer: Self-pay

## 2020-06-12 NOTE — Telephone Encounter (Signed)
Copied from [place] [id number]. Topic: General - Other >> Jun 12, 2020  1:15 PM Keene Breath wrote: Reason for CRM: Patient called to ask the nurse or doctor to advise her on whether she should come off her blood thinner because she has to have a tooth pulled.  She stated her dentist told her to check with the PCP before the tooth is pulled.  Please advise and call patient to let her know at [phone]

## 2020-06-13 NOTE — Telephone Encounter (Signed)
I believe they should be able to do the procedure safely if her INR is between 1.5 and 2.5. She has many chronic issues for which she takes the warfarin and I think it would be a bit risky to come off. Ultimately, I would check with cardiology to be doubly sure. If her dentist would like her INR checked prior to prior to her surgery we can put her on PT clinic.

## 2020-06-14 NOTE — Telephone Encounter (Signed)
Called patient and no answer, lvm to return call.

## 2020-06-15 NOTE — Telephone Encounter (Signed)
Patient was advised and states that she will check with her cardiology.

## 2020-06-22 ENCOUNTER — Other Ambulatory Visit: Payer: Self-pay | Admitting: Physician Assistant

## 2020-06-22 DIAGNOSIS — E611 Iron deficiency: Secondary | ICD-10-CM

## 2020-07-03 ENCOUNTER — Other Ambulatory Visit: Payer: Self-pay

## 2020-07-03 ENCOUNTER — Encounter (HOSPITAL_COMMUNITY): Payer: Self-pay | Admitting: Internal Medicine

## 2020-07-03 ENCOUNTER — Other Ambulatory Visit (HOSPITAL_COMMUNITY): Payer: Self-pay

## 2020-07-03 ENCOUNTER — Ambulatory Visit (HOSPITAL_COMMUNITY)
Admission: RE | Admit: 2020-07-03 | Discharge: 2020-07-03 | Disposition: A | Discharge status: Home / Self Care | Payer: Medicare Other | Source: Ambulatory Visit | Attending: Internal Medicine | Admitting: Internal Medicine

## 2020-07-03 VITALS — BP 130/80 | HR 61 | Wt 247.6 lb

## 2020-07-03 DIAGNOSIS — I5032 Chronic diastolic (congestive) heart failure: Secondary | ICD-10-CM

## 2020-07-03 DIAGNOSIS — I272 Pulmonary hypertension, unspecified: Secondary | ICD-10-CM | POA: Diagnosis not present

## 2020-07-03 DIAGNOSIS — I4819 Other persistent atrial fibrillation: Secondary | ICD-10-CM | POA: Diagnosis not present

## 2020-07-03 DIAGNOSIS — I130 Hypertensive heart and chronic kidney disease with heart failure and stage 1 through stage 4 chronic kidney disease, or unspecified chronic kidney disease: Secondary | ICD-10-CM | POA: Insufficient documentation

## 2020-07-03 DIAGNOSIS — Z8673 Personal history of transient ischemic attack (TIA), and cerebral infarction without residual deficits: Secondary | ICD-10-CM | POA: Diagnosis not present

## 2020-07-03 DIAGNOSIS — Z880 Allergy status to penicillin: Secondary | ICD-10-CM | POA: Insufficient documentation

## 2020-07-03 DIAGNOSIS — I739 Peripheral vascular disease, unspecified: Secondary | ICD-10-CM | POA: Insufficient documentation

## 2020-07-03 DIAGNOSIS — G4733 Obstructive sleep apnea (adult) (pediatric): Secondary | ICD-10-CM | POA: Diagnosis not present

## 2020-07-03 DIAGNOSIS — N184 Chronic kidney disease, stage 4 (severe): Secondary | ICD-10-CM | POA: Diagnosis not present

## 2020-07-03 DIAGNOSIS — Z882 Allergy status to sulfonamides status: Secondary | ICD-10-CM | POA: Diagnosis not present

## 2020-07-03 DIAGNOSIS — Z79899 Other long term (current) drug therapy: Secondary | ICD-10-CM | POA: Diagnosis not present

## 2020-07-03 DIAGNOSIS — I482 Chronic atrial fibrillation, unspecified: Secondary | ICD-10-CM | POA: Diagnosis not present

## 2020-07-03 DIAGNOSIS — Z86711 Personal history of pulmonary embolism: Secondary | ICD-10-CM | POA: Insufficient documentation

## 2020-07-03 DIAGNOSIS — Z8249 Family history of ischemic heart disease and other diseases of the circulatory system: Secondary | ICD-10-CM | POA: Insufficient documentation

## 2020-07-03 DIAGNOSIS — I2510 Atherosclerotic heart disease of native coronary artery without angina pectoris: Secondary | ICD-10-CM | POA: Insufficient documentation

## 2020-07-03 DIAGNOSIS — Z7901 Long term (current) use of anticoagulants: Secondary | ICD-10-CM | POA: Diagnosis not present

## 2020-07-03 DIAGNOSIS — E119 Type 2 diabetes mellitus without complications: Secondary | ICD-10-CM | POA: Insufficient documentation

## 2020-07-03 DIAGNOSIS — I480 Paroxysmal atrial fibrillation: Secondary | ICD-10-CM | POA: Insufficient documentation

## 2020-07-03 DIAGNOSIS — R42 Dizziness and giddiness: Secondary | ICD-10-CM | POA: Insufficient documentation

## 2020-07-03 HISTORY — DX: Heart failure, unspecified: I50.9

## 2020-07-03 LAB — BASIC METABOLIC PANEL
Anion gap: 10 (ref 5–15)
BUN: 49 mg/dL — ABNORMAL HIGH (ref 8–23)
CO2: 27 mmol/L (ref 22–32)
Calcium: 9 mg/dL (ref 8.9–10.3)
Chloride: 104 mmol/L (ref 98–111)
Creatinine, Ser: 1.95 mg/dL — ABNORMAL HIGH (ref 0.44–1.00)
GFR, Estimated: 26 mL/min — ABNORMAL LOW (ref >60)
Glucose, Bld: 141 mg/dL — ABNORMAL HIGH (ref 70–99)
Potassium: 4.7 mmol/L (ref 3.5–5.1)
Sodium: 141 mmol/L (ref 135–145)

## 2020-07-03 LAB — CBC
HCT: 37 % (ref 36.0–46.0)
Hemoglobin: 11.1 g/dL — ABNORMAL LOW (ref 12.0–15.0)
MCH: 30 pg (ref 26.0–34.0)
MCHC: 30 g/dL (ref 30.0–36.0)
MCV: 100 fL (ref 80.0–100.0)
Platelets: 247 10*3/uL (ref 150–400)
RBC: 3.7 MIL/uL — ABNORMAL LOW (ref 3.87–5.11)
RDW: 14.8 % (ref 11.5–15.5)
WBC: 5.5 10*3/uL (ref 4.0–10.5)
nRBC: 0 % (ref 0.0–0.2)

## 2020-07-03 LAB — PROTIME-INR
INR: 2.6 — ABNORMAL HIGH (ref 0.8–1.2)
Prothrombin Time: 27.2 seconds — ABNORMAL HIGH (ref 11.4–15.2)

## 2020-07-03 MED ORDER — EMPAGLIFLOZIN 10 MG PO TABS: 10.0000 mg | ORAL_TABLET | Freq: Every day | ORAL | 6 refills | Status: DC

## 2020-07-03 NOTE — Progress Notes (Signed)
Advanced Heart Failure Clinic Note   Date:  07/03/2020   ID:  Ann Flynn, DOB 13-Sep-1940, MRN [id number]  Location: Home  Provider location: [facility] Type of Visit: Established patient  PCP:  Trinna Post, PA-C  Cardiologist:  Rockey Situ Primary HF: Ann Flynn  Chief Complaint: PAH follow-up   History of Present Illness:  [name] is a 79 year old woman with h/o morbid obesity, DM, OSA on CPAP, CKD 4, PAD, CVA, carotid surgery in 2010, pulmonary embolism (2009) and PAH referred by Dr.Gollan for further evaluation of her [place].   She recently moved from [place] to be near her family as things were getting harder. Lives by herself in a townhome.  Reports DVT/PE in 2009 and has been on coumadin since. Was diagnosed with PAH in 2016 at [facility] in [place]. Right and left heart cath at that time reported as nonobstructive CAD and severe PAH with PAP 103/19 (51) with PCWP 5. (see results below) Was told she had idiopathic PAH and started on Letairis and cialis. She weighed 255 pounds at the time.   Never smoked but was exposed to 2ndhand smoke. Previouslyon portable oxygen at 5L [place], but the portable O2 was not covered. Now "goes without oxygen", only at nightthat she wear oxygen with CPAP. Denies h/o CTD.  I saw her for the first time in 11/20: Felt to have primarily WHO Group III PH. Echo and VQ ordered.   - Echo 07/07/19: EF 55-60% RV mildly dilated with norma function. No RVSP calculated - VQ 07/07/19: No PE  RHC  07/29/19 RA = 8 RV = 81/14 PA =  80/18 (42) PCW =17 Fick cardiac output/index = 9.25/4.25 PVR = 2.70 WU FA sat = 98% PA sat = 75%, 75% High SVC 72%   Repeat RHC 9/21  RA = 8 RV = 65/7 PA = 61/19 (34) PCW = 19 (v= 27) Fick cardiac output/index = 8.1/3.8 PVR = 1.2 WU Ao sat = 99% PA sat = 75%, 78% SVC = 70%    Has seen Dr. Mortimer Fries in Pulmonary and had PFTs. Unable to do  6MW.  PFTs 09/28/19  FEV1 1.92 (57%) FVC 2.57 (58%) DLCO 61%   Had a syncopal episode in 8/21. CT head showed small 2 mm subdural hematoma. Echocardiogram was obtained, echocardiogram showed EF 55 to 60%, moderate left ventricular hypertrophy, moderately elevated pulmonary artery systolic pressure. Amlodipine stopped Outpatient monitor was ordered and showed atrial flutter continuously (100% burden) with rates ranging from 50 to 133 (average 85 bpm). F/u CT scan 8/23 SDH resolved  Returns for routine f/u. Says she has not felt well. Says since decreasing the torsemide she has becoming much more dizzy. Had another fall at [facility]. Also much more SOB. Struggling with ADLs. No orthopnea or PND. Wearing CPAP. On warfarin without bleeding.     Previous cardiac studies:    12/16/2014 right and left heart catheterization with nitric oxide inhalation, Pressure data resting: -Mean right atrial pressure 2 Right ventricule 623/7 and diastolic -Pulmonary artery 103/19, mean 51. Pulmonary artery wedge mean 5 -Ascending thoracic aorta 111/53, mean 75. -Fick cardiac output 4.2 L. -Pulmonary resistance 12.25 Woods units. With nitric oxide inhalation -Pulmonary artery 89/29, mean 42. -Right ventricle 62/8 and diastolic. -Right atrial 2. -Fick cardiac output 4.94 L. -Pulmonary resistance 15.91 Woods units.    Past Medical History:  Diagnosis Date  . A-fib ([place])   . Anemia   .  CHF (congestive heart failure) ([place])   . Chronic kidney disease   . Diabetes mellitus without complication ([place])   . Hypertension   . Pulmonary hypertension ([place])    Past Surgical History:  Procedure Laterality Date  . ABDOMINAL HYSTERECTOMY  1997  . ABDOMINAL HYSTERECTOMY    . cartiod artery surgery  2010   per patient   . CHOLECYSTECTOMY  10/19/2009  . masectomy    . MASTECTOMY  1994  . RIGHT HEART CATH N/A 07/29/2019   Procedure: RIGHT HEART CATH;  Surgeon: Jolaine Artist, MD;  Location: [place] [facility];  Service: Cardiovascular;  Laterality: N/A;  . RIGHT HEART CATH N/A 05/11/2020   Procedure: RIGHT HEART CATH;  Surgeon: Jolaine Artist, MD;  Location: [place] [facility];  Service: Cardiovascular;  Laterality: N/A;     Current Outpatient Medications  Medication Sig Dispense Refill  . acetaminophen (TYLENOL) 500 MG tablet Take 500 mg by mouth 2 (two) times daily.     [name] ambrisentan (LETAIRIS) 5 MG tablet Take 5 mg by mouth at bedtime.    [name] atorvastatin (LIPITOR) 80 MG tablet TAKE 1 TABLET BY MOUTH DAILY (Patient taking differently: Take 80 mg by mouth at bedtime. ) 90 tablet 2  . BD PEN NEEDLE NANO U/F 32G X 4 MM MISC USE TO INJECT ONCE DAILY AS DIRECTED 100 each 2  . Cholecalciferol (VITAMIN D3) 50 MCG (2000 UT) TABS Take 2,000 Units by mouth in the morning and at bedtime.    . Cyanocobalamin (B-12 PO) Take 25 mcg by mouth in the morning and at bedtime.     . docusate sodium (COLACE) 100 MG capsule Take 100 mg by mouth at bedtime.    . FEROSUL 325 (65 Fe) MG tablet TAKE 1 TABLET BY MOUTH 3 TIMES DAILY WITH MEALS 90 tablet 0  . JANUVIA 25 MG tablet TAKE ONE TABLET BY MOUTH EVERY DAY (Patient taking differently: Take 25 mg by mouth daily. ) 90 tablet 0  . losartan (COZAAR) 50 MG tablet Take 1 tablet (50 mg total) by mouth daily. 90 tablet 3  . metoprolol tartrate (LOPRESSOR) 25 MG tablet TAKE 1 TABLET BY MOUTH TWICE DAILY (Patient taking differently: Take 25 mg by mouth 2 (two) times daily. ) 180 tablet 1  . omeprazole (PRILOSEC) 40 MG capsule TAKE 1 CAPSULE BY MOUTH ONCE DAILY (Patient taking differently: Take 40 mg by mouth daily. ) 90 capsule 1  . ONETOUCH ULTRA test strip USE AS DIRECTED 100 each 1  . tadalafil, PAH, (ADCIRCA) 20 MG tablet Take 2 tablets (40 mg total) by mouth at bedtime. 180 tablet 3  . torsemide (DEMADEX) 20 MG tablet Take 1 tablet (20 mg total) by mouth daily. 90 tablet 2  . TOUJEO SOLOSTAR 300 UNIT/ML Solostar Pen INJECT 50 UNITS INTO THE SKIN DAILY  (Patient taking differently: Inject 50 Units into the skin at bedtime. ) 4.5 mL 2  . venlafaxine (EFFEXOR) 75 MG tablet Take 75 mg by mouth every other day.    . warfarin (COUMADIN) 3 MG tablet Take 3 mg on Wed and Saturday (Patient taking differently: Take 3 mg by mouth See admin instructions. Take 3 mg on Wed and Saturday) 30 tablet 1  . warfarin (COUMADIN) 4 MG tablet Take 4 mg on Sun, Mon, Tues, Thurs, Friday. (Patient taking differently: Take 4 mg by mouth See admin instructions. Take 4 mg on Sun, Mon, Tues, Thurs, Friday.) 30 tablet 1   No current facility-administered medications  for this encounter.    Allergies:   Penicillins and Sulfa antibiotics   Social History:  The patient  reports that she has never smoked. She has never used smokeless tobacco. She reports that she does not drink alcohol and does not use drugs.   Family History:  The patient's family history includes CAD in her maternal grandmother and mother; Heart disease in her father.   ROS:  Please see the history of present illness.   All other systems are personally reviewed and negative.   Vitals:   07/03/20 0927  BP: 130/80  Pulse: 61  SpO2: 100%  Weight: 112.3 kg (247 lb 9.6 oz)    Wt Readings from Last 3 Encounters:  07/03/20 112.3 kg (247 lb 9.6 oz)  05/11/20 108.9 kg (240 lb)  05/03/20 110.3 kg (243 lb 3.2 oz)   Orthostatics done personally 118/70 sitting. No change with standing  Exam:   General:  Obese woman wearing O2. No resp difficulty HEENT: normal Neck: supple. JVP hard to see Carotids 2+ bilat; no bruits. No lymphadenopathy or thryomegaly appreciated. Cor: PMI nondisplaced. Irregular rate & rhythm. No rubs, gallops or murmurs. Lungs: decreased throughout  Abdomen: obese soft, nontender, nondistended. No hepatosplenomegaly. No bruits or masses. Good bowel sounds. Extremities: no cyanosis, clubbing, rash, trace edema Neuro: alert & orientedx3, cranial nerves grossly intact. moves all 4  extremities w/o difficulty. Affect pleasant   Recent Labs: 01/27/2020: ALT 12; B Natriuretic Peptide 450.6 05/03/2020: Platelets 232 05/11/2020: Hemoglobin 10.2 05/23/2020: BUN 50; Creatinine, Ser 1.55; Potassium 4.8; Sodium 141  Personally reviewed   Wt Readings from Last 3 Encounters:  07/03/20 112.3 kg (247 lb 9.6 oz)  05/11/20 108.9 kg (240 lb)  05/03/20 110.3 kg (243 lb 3.2 oz)    ECG Atrial flutter 61. Personally reviewed   ASSESSMENT AND PLAN:  1. Pulmonary HTN (severe) with cor pulmonale - suspect this is really WHO Group 3 disease due to OSA/OHS but given markedly elevated PVR may also have component of WHO Group I & IV - Repeat RHC with improved pulmonary pressures and more elevated PCWP  - will continue tadalafil 40 and letairis 5 (unable to tolerate higher dose of letairis). With low PVR would not add selxipag at this point - PFTs with significant restrictive lung physiology - Echo 07/07/19: EF 55-60% RV mildly dilated with norma function. No RVSP calculated. BNP 549 - Echo 8/21 EF 55-60% RV moderately HK RVSP 6mHG - VQ 07/07/19: No PE - continue O2 supplementation and CPAP - Remains tenuous NYHA III. Volume status up on exam and on RHC. Will restart jardiance.  - Continue torsemide 20 daily. We discussed increasing to 20 daily alternating with 40 daily but she wants to see if Jardiance helps first.   2. Chronic diastolic HF - Plan as above  3. Dizziness - I had been thinking that this was volume depletion but not volume depleted on RHC and not orthostatic - ? If related to AFL or to increasing SOB  - plan DC-CV next week  - If no improvement with DC-CV refer to Neurology  4. DM2 - given HF and CKD 3b with volume overload will restart Jardiance  4. CKD 4 - last creatinine 1.9 - Restart Jardiance  5. Nonobstructive CAD - No s/s angina - Continue statin. Off ASA with coumadin  6. Morbid obesity - stressed need for weight loss as above  7. H/o PE  2009 - on warfarin.  - VQ 11/20 negative - Does not want  to switch to DOAC due to cost  8. HTN - stable  9. Paroxysmal AFL - new diagnosis. Was in AFL in 6/21. ECG from 11/20 was NSR  - rate controlled.  - Zio shows continuous AFL with rate control - was off coumadin due to SDH but now back on.  - INR on 8/31 was 1.8.  On INR 9/22 2.7 and 9/24 it was 2.4  - Recheck INR. If >= 2.0 Will arrange for DC-CV next week. If < 2.0 will need TEE as well  Total time spent 35 minutes. Over half that time spent discussing above.    Signed, Glori Bickers, MD  07/03/2020 10:00 AM  Advanced Heart Failure [place] [address] [phone] (office) [phone] (fax)

## 2020-07-03 NOTE — H&P (View-Only) (Signed)
Advanced Heart Failure Clinic Note   Date:  07/03/2020   ID:  Ann Flynn, DOB 13-Sep-1940, MRN [id number]  Location: Home  Provider location: [place] [facility] Type of Visit: Established patient  PCP:  Trinna Post, PA-C  Cardiologist:  Rockey Situ Primary HF: Okey Zelek  Chief Complaint: PAH follow-up   History of Present Illness:  Ann Flynn is a 79 year old woman with h/o morbid obesity, DM, OSA on CPAP, CKD 4, PAD, CVA, carotid surgery in 2010, pulmonary embolism (2009) and PAH referred by Dr.Gollan for further evaluation of her [place].   She recently moved from [place] to be near her family as things were getting harder. Lives by herself in a townhome.  Reports DVT/PE in 2009 and has been on coumadin since. Was diagnosed with PAH in 2016 at [facility] in [place]. Right and left heart cath at that time reported as nonobstructive CAD and severe PAH with PAP 103/19 (51) with PCWP 5. (see results below) Was told she had idiopathic PAH and started on Letairis and cialis. She weighed 255 pounds at the time.   Never smoked but was exposed to 2ndhand smoke. Previouslyon portable oxygen at 5L when [place] was in [place], but the portable O2 was not covered. Now "goes without oxygen", only at nightthat she wear oxygen with CPAP. Denies h/o CTD.  I saw her for the first time in 11/20: Felt to have primarily WHO Group III PH. Echo and VQ ordered.   - Echo 07/07/19: EF 55-60% RV mildly dilated with norma function. No RVSP calculated - VQ 07/07/19: No PE  RHC  07/29/19 RA = 8 RV = 81/14 PA =  80/18 (42) PCW =17 Fick cardiac output/index = 9.25/4.25 PVR = 2.70 WU FA sat = 98% PA sat = 75%, 75% High SVC 72%   Repeat RHC 9/21  RA = 8 RV = 65/7 PA = 61/19 (34) PCW = 19 (v= 27) Fick cardiac output/index = 8.1/3.8 PVR = 1.2 WU Ao sat = 99% PA sat = 75%, 78% SVC = 70%    Has seen Dr. Mortimer Fries in Pulmonary and had PFTs. Unable to do  6MW.  PFTs 09/28/19  FEV1 1.92 (57%) FVC 2.57 (58%) DLCO 61%   Had a syncopal episode in 8/21. CT head showed small 2 mm subdural hematoma. Echocardiogram was obtained, echocardiogram showed EF 55 to 60%, moderate left ventricular hypertrophy, moderately elevated pulmonary artery systolic pressure. Amlodipine stopped Outpatient monitor was ordered and showed atrial flutter continuously (100% burden) with rates ranging from 50 to 133 (average 85 bpm). F/u CT scan 8/23 SDH resolved  Returns for routine f/u. Says she has not felt well. Says since decreasing the torsemide she has becoming much more dizzy. Had another fall at [facility]. Also much more SOB. Struggling with ADLs. No orthopnea or PND. Wearing CPAP. On warfarin without bleeding.     Previous cardiac studies:    12/16/2014 right and left heart catheterization with nitric oxide inhalation, Pressure data resting: -Mean right atrial pressure 2 Right ventricule 623/7 and diastolic -Pulmonary artery 103/19, mean 51. Pulmonary artery wedge mean 5 -Ascending thoracic aorta 111/53, mean 75. -Fick cardiac output 4.2 L. -Pulmonary resistance 12.25 Woods units. With nitric oxide inhalation -Pulmonary artery 89/29, mean 42. -Right ventricle 62/8 and diastolic. -Right atrial 2. -Fick cardiac output 4.94 L. -Pulmonary resistance 15.91 Woods units.    Past Medical History:  Diagnosis Date  . A-fib ([place])   . Anemia   .  CHF (congestive heart failure) ([place])   . Chronic kidney disease   . Diabetes mellitus without complication ([place])   . Hypertension   . Pulmonary hypertension ([place])    Past Surgical History:  Procedure Laterality Date  . ABDOMINAL HYSTERECTOMY  1997  . ABDOMINAL HYSTERECTOMY    . cartiod artery surgery  2010   per patient   . CHOLECYSTECTOMY  10/19/2009  . masectomy    . MASTECTOMY  1994  . RIGHT HEART CATH N/A 07/29/2019   Procedure: RIGHT HEART CATH;  Surgeon: Jolaine Artist, MD;  Location: [place] [facility];  Service: Cardiovascular;  Laterality: N/A;  . RIGHT HEART CATH N/A 05/11/2020   Procedure: RIGHT HEART CATH;  Surgeon: Jolaine Artist, MD;  Location: [place] [facility];  Service: Cardiovascular;  Laterality: N/A;     Current Outpatient Medications  Medication Sig Dispense Refill  . acetaminophen (TYLENOL) 500 MG tablet Take 500 mg by mouth 2 (two) times daily.     [name] ambrisentan (LETAIRIS) 5 MG tablet Take 5 mg by mouth at bedtime.    [name] atorvastatin (LIPITOR) 80 MG tablet TAKE 1 TABLET BY MOUTH DAILY (Patient taking differently: Take 80 mg by mouth at bedtime. ) 90 tablet 2  . BD PEN NEEDLE NANO U/F 32G X 4 MM MISC USE TO INJECT ONCE DAILY AS DIRECTED 100 each 2  . Cholecalciferol (VITAMIN D3) 50 MCG (2000 UT) TABS Take 2,000 Units by mouth in the morning and at bedtime.    . Cyanocobalamin (B-12 PO) Take 25 mcg by mouth in the morning and at bedtime.     . docusate sodium (COLACE) 100 MG capsule Take 100 mg by mouth at bedtime.    . FEROSUL 325 (65 Fe) MG tablet TAKE 1 TABLET BY MOUTH 3 TIMES DAILY WITH MEALS 90 tablet 0  . JANUVIA 25 MG tablet TAKE ONE TABLET BY MOUTH EVERY DAY (Patient taking differently: Take 25 mg by mouth daily. ) 90 tablet 0  . losartan (COZAAR) 50 MG tablet Take 1 tablet (50 mg total) by mouth daily. 90 tablet 3  . metoprolol tartrate (LOPRESSOR) 25 MG tablet TAKE 1 TABLET BY MOUTH TWICE DAILY (Patient taking differently: Take 25 mg by mouth 2 (two) times daily. ) 180 tablet 1  . omeprazole (PRILOSEC) 40 MG capsule TAKE 1 CAPSULE BY MOUTH ONCE DAILY (Patient taking differently: Take 40 mg by mouth daily. ) 90 capsule 1  . ONETOUCH ULTRA test strip USE AS DIRECTED 100 each 1  . tadalafil, PAH, (ADCIRCA) 20 MG tablet Take 2 tablets (40 mg total) by mouth at bedtime. 180 tablet 3  . torsemide (DEMADEX) 20 MG tablet Take 1 tablet (20 mg total) by mouth daily. 90 tablet 2  . TOUJEO SOLOSTAR 300 UNIT/ML Solostar Pen INJECT 50 UNITS INTO THE SKIN DAILY  (Patient taking differently: Inject 50 Units into the skin at bedtime. ) 4.5 mL 2  . venlafaxine (EFFEXOR) 75 MG tablet Take 75 mg by mouth every other day.    . warfarin (COUMADIN) 3 MG tablet Take 3 mg on Wed and Saturday (Patient taking differently: Take 3 mg by mouth See admin instructions. Take 3 mg on Wed and Saturday) 30 tablet 1  . warfarin (COUMADIN) 4 MG tablet Take 4 mg on Sun, Mon, Tues, Thurs, Friday. (Patient taking differently: Take 4 mg by mouth See admin instructions. Take 4 mg on Sun, Mon, Tues, Thurs, Friday.) 30 tablet 1   No current facility-administered medications  for this encounter.    Allergies:   Penicillins and Sulfa antibiotics   Social History:  The patient  reports that she has never smoked. She has never used smokeless tobacco. She reports that she does not drink alcohol and does not use drugs.   Family History:  The patient's family history includes CAD in her maternal grandmother and mother; Heart disease in her father.   ROS:  Please see the history of present illness.   All other systems are personally reviewed and negative.   Vitals:   07/03/20 0927  BP: 130/80  Pulse: 61  SpO2: 100%  Weight: 112.3 kg (247 lb 9.6 oz)    Wt Readings from Last 3 Encounters:  07/03/20 112.3 kg (247 lb 9.6 oz)  05/11/20 108.9 kg (240 lb)  05/03/20 110.3 kg (243 lb 3.2 oz)   Orthostatics done personally 118/70 sitting. No change with standing  Exam:   General:  Obese woman wearing O2. No resp difficulty HEENT: normal Neck: supple. JVP hard to see Carotids 2+ bilat; no bruits. No lymphadenopathy or thryomegaly appreciated. Cor: PMI nondisplaced. Irregular rate & rhythm. No rubs, gallops or murmurs. Lungs: decreased throughout  Abdomen: obese soft, nontender, nondistended. No hepatosplenomegaly. No bruits or masses. Good bowel sounds. Extremities: no cyanosis, clubbing, rash, trace edema Neuro: alert & orientedx3, cranial nerves grossly intact. moves all 4  extremities w/o difficulty. Affect pleasant   Recent Labs: 01/27/2020: ALT 12; B Natriuretic Peptide 450.6 05/03/2020: Platelets 232 05/11/2020: Hemoglobin 10.2 05/23/2020: BUN 50; Creatinine, Ser 1.55; Potassium 4.8; Sodium 141  Personally reviewed   Wt Readings from Last 3 Encounters:  07/03/20 112.3 kg (247 lb 9.6 oz)  05/11/20 108.9 kg (240 lb)  05/03/20 110.3 kg (243 lb 3.2 oz)    ECG Atrial flutter 61. Personally reviewed   ASSESSMENT AND PLAN:  1. Pulmonary HTN (severe) with cor pulmonale - suspect this is really WHO Group 3 disease due to OSA/OHS but given markedly elevated PVR may also have component of WHO Group I & IV - Repeat RHC with improved pulmonary pressures and more elevated PCWP  - will continue tadalafil 40 and letairis 5 (unable to tolerate higher dose of letairis). With low PVR would not add selxipag at this point - PFTs with significant restrictive lung physiology - Echo 07/07/19: EF 55-60% RV mildly dilated with norma function. No RVSP calculated. BNP 549 - Echo 8/21 EF 55-60% RV moderately HK RVSP 6mHG - VQ 07/07/19: No PE - continue O2 supplementation and CPAP - Remains tenuous NYHA III. Volume status up on exam and on RHC. Will restart jardiance.  - Continue torsemide 20 daily. We discussed increasing to 20 daily alternating with 40 daily but she wants to see if Jardiance helps first.   2. Chronic diastolic HF - Plan as above  3. Dizziness - I had been thinking that this was volume depletion but not volume depleted on RHC and not orthostatic - ? If related to AFL or to increasing SOB  - plan DC-CV next week  - If no improvement with DC-CV refer to Neurology  4. DM2 - given HF and CKD 3b with volume overload will restart Jardiance  4. CKD 4 - last creatinine 1.9 - Restart Jardiance  5. Nonobstructive CAD - No s/s angina - Continue statin. Off ASA with coumadin  6. Morbid obesity - stressed need for weight loss as above  7. H/o PE  2009 - on warfarin.  - VQ 11/20 negative - Does not want  to switch to DOAC due to cost  8. HTN - stable  9. Paroxysmal AFL - new diagnosis. Was in AFL in 6/21. ECG from 11/20 was NSR  - rate controlled.  - Zio shows continuous AFL with rate control - was off coumadin due to SDH but now back on.  - INR on 8/31 was 1.8.  On INR 9/22 2.7 and 9/24 it was 2.4  - Recheck INR. If >= 2.0 Will arrange for DC-CV next week. If < 2.0 will need TEE as well  Total time spent 35 minutes. Over half that time spent discussing above.    Signed, Glori Bickers, MD  07/03/2020 10:00 AM  Advanced Heart Failure [place] [address] [phone] (office) [phone] (fax)

## 2020-07-03 NOTE — Patient Instructions (Signed)
Start Jardiance 71m ( 1 tab) daily  Increase Torsemide to 40 mg (2 tabs)  For 2 days ONLY  Labs done today, your results will be available in MyChart, we will contact you for abnormal readings.   Your physician has recommended that you have a Cardioversion (DCCV). Electrical Cardioversion uses a jolt of electricity to your heart either through paddles or wired patches attached to your chest. This is a controlled, usually prescheduled, procedure. Defibrillation is done under light anesthesia in the hospital, and you usually go home the day of the procedure. This is done to get your heart back into a normal rhythm. You are not awake for the procedure. Please see the instruction sheet given to you today.  Your physician recommends that you schedule a follow-up appointment in:  3-4 months  If you have any questions or concerns before your next appointment please send [place] message through [place]Estes [place]or call our office at [phone]    TO LEAVE A MESSAGE FOR THE NURSE SELECT OPTION 2, PLEASE LEAVE A MESSAGE INCLUDING:  YOUR NAME  DATE OF BIRTH  CALL BACK NUMBER  REASON FOR CALL**this is important as we prioritize the call backs  YOU WILL RECEIVE A CALL BACK THE SAME DAY AS LONG AS YOU CALL BEFORE 4:00 PM   At the [facility] you and your health needs are our priority. As part of our continuing mission to provide you with exceptional heart care, we have created designated Provider Care Teams. These Care Teams include your primary Cardiologist (physician) and Advanced Practice Providers (APPs- Physician Assistants and Nurse Practitioners) who all work together to provide you with the care you need, when you need it.   You may see any of the following providers on your designated Care Team at your next follow up:  Dr DGlori Bickers Dr DHaynes Kerns NP  BLyda Jester PUtah LAudry Riles PharmD   Please be sure to bring in all your medications  bottles to every appointment.      You are scheduled for a Cardioversion on Tuesday 11/23 2021 with Dr. BHaroldine Laws  [facility] [address] [id number] 11:30 am  DIET: Nothing to eat or drink after midnight except a sip of water with medications (see medication instructions below)  Medication Instructions: Monday night only take 25 units of insulin Hold Jardiance,Torsemide,Januvia Tuesday am  Continue your anticoagulant: Coumadin  Covid Test :Saturday 07/07/2020 @ 11:20 am [address] [place] responsible person to drive you home and stay in the waiting area during your procedure. Failure to do so could result in cancellation.  Bring your insurance cards.  *Special Note: Every effort is made to have your procedure done on time. Occasionally there are emergencies that occur at the hospital that may cause delays. Please be patient if a delay does occur.

## 2020-07-06 ENCOUNTER — Telehealth: Payer: Self-pay

## 2020-07-06 NOTE — Progress Notes (Signed)
Chronic Care Management Pharmacy Assistant   Name: Ann Flynn  MRN: [id number] DOB: August 05, 1941  Reason for Encounter: Medication Review  Patient Questions:  1.  Have you seen any other providers since your last visit? No  2.  Any changes in your medicines or health? No   Ann Flynn,  79 y.o. , female presents for their Follow-Up CCM visit with the clinical pharmacist via telephone.  PCP : Ann Post, PA-C  Allergies:   Allergies  Allergen Reactions  . Penicillins Swelling    Facial swelling Did it involve swelling of the face/tongue/throat, SOB, or low BP? Yes Did it involve sudden or severe rash/hives, skin peeling, or any reaction on the inside of your mouth or nose? No Did you need to seek medical attention at a hospital or doctor's office? No When did it last happen?10 + years If all above answers are "NO", may proceed with cephalosporin use.   . Sulfa Antibiotics Rash    Medications: Outpatient Encounter Medications as of 07/06/2020  Medication Sig Note  . acetaminophen (TYLENOL) 650 MG CR tablet Take 650-1,300 mg by mouth every 8 (eight) hours as needed for pain.   [name] ambrisentan (LETAIRIS) 5 MG tablet Take 5 mg by mouth at bedtime.   [name] atorvastatin (LIPITOR) 80 MG tablet TAKE 1 TABLET BY MOUTH DAILY (Patient taking differently: Take 80 mg by mouth at bedtime. )   . BD PEN NEEDLE NANO U/F 32G X 4 MM MISC USE TO INJECT ONCE DAILY AS DIRECTED   . Cholecalciferol (VITAMIN D3) 50 MCG (2000 UT) TABS Take 2,000 Units by mouth in the morning and at bedtime.   . docusate sodium (COLACE) 100 MG capsule Take 100 mg by mouth daily as needed (constipation.).    [name] empagliflozin (JARDIANCE) 10 MG TABS tablet Take 1 tablet (10 mg total) by mouth daily before breakfast. 07/04/2020: New medication patient has not started   . FEROSUL 325 (65 Fe) MG tablet TAKE 1 TABLET BY MOUTH 3 TIMES DAILY WITH MEALS (Patient taking differently: Take 325 mg by mouth in the morning,  at noon, and at bedtime. )   . JANUVIA 25 MG tablet TAKE ONE TABLET BY MOUTH EVERY DAY (Patient taking differently: Take 25 mg by mouth daily. )   . losartan (COZAAR) 50 MG tablet Take 1 tablet (50 mg total) by mouth daily. (Patient taking differently: Take 50 mg by mouth every evening. )   . metoprolol tartrate (LOPRESSOR) 25 MG tablet TAKE 1 TABLET BY MOUTH TWICE DAILY (Patient taking differently: Take 25 mg by mouth 2 (two) times daily. )   . omeprazole (PRILOSEC) 40 MG capsule TAKE 1 CAPSULE BY MOUTH ONCE DAILY (Patient taking differently: Take 40 mg by mouth at bedtime. )   . ONETOUCH ULTRA test strip USE AS DIRECTED   . tadalafil, PAH, (ADCIRCA) 20 MG tablet Take 2 tablets (40 mg total) by mouth at bedtime.   . torsemide (DEMADEX) 20 MG tablet Take 1 tablet (20 mg total) by mouth daily.   [name] SOLOSTAR 300 UNIT/ML Solostar Pen INJECT 50 UNITS INTO THE SKIN DAILY (Patient taking differently: Inject 50 Units into the skin at bedtime. )   . venlafaxine (EFFEXOR) 75 MG tablet Take 75 mg by mouth daily.    . vitamin B-12 (CYANOCOBALAMIN) 1000 MCG tablet Take 1,000 mcg by mouth in the morning and at bedtime.   [name] warfarin (COUMADIN) 3 MG tablet Take 3 mg on Wed and Saturday (Patient taking  differently: Take 3 mg by mouth 2 (two) times a week. Take 1 tablet (3 mg) by mouth on Wednesdays & Saturdays in the evening.)   . warfarin (COUMADIN) 4 MG tablet Take 4 mg on Sun, Mon, Tues, Thurs, Friday. (Patient taking differently: Take 4 mg by mouth See admin instructions. Take 1 tablet (4 mg) by mouth on Sundays, Mondays, Tuesdays, Thursdays & Fridays in the evening.)    No facility-administered encounter medications on file as of 07/06/2020.    Current Diagnosis: Patient Active Problem List   Diagnosis Date Noted  . Atrial fibrillation, chronic ([place]) 03/24/2020  . Type 2 diabetes mellitus with stage 4 chronic kidney disease ([place]) 03/24/2020  . Syncope 03/24/2020  . Pulmonary hypertension ([place])  03/24/2020  . Subdural hematoma, acute ([place]) 03/24/2020  . Pulmonary nodules 01/30/2020  . Chronic pulmonary embolism, unspecified pulmonary embolism type, unspecified whether acute cor pulmonale present ([place]) 10/06/2019  . Chronic respiratory failure with hypoxia ([place]) 10/06/2019  . Morbid (severe) obesity due to excess calories ([place]) 10/06/2019  . Hypertension associated with diabetes ([place]) 10/06/2019  . Hyperlipidemia associated with type 2 diabetes mellitus ([place]) 10/06/2019  . CKD (chronic kidney disease), stage IV ([place]) 04/01/2019  . Allergic rhinitis 03/28/2019  . Arthritis 03/28/2019  . CHF (congestive heart failure) ([place]) 03/28/2019  . Diabetes mellitus without complication ([place]) 03/88/8280  . Sleep apnea 03/28/2019  . Pulmonary hypertension, primary ([place]) 03/28/2019    Goals Addressed   None     Follow-Up:  Coordination of Enhanced Pharmacy Services  .[name] Recent Relevant Labs: Lab Results  Component Value Date/Time   HGBA1C 7.9 (H) 03/24/2020 06:15 AM   HGBA1C 7.3 (A) 01/18/2020 01:12 PM   HGBA1C 7.3 (A) 07/06/2019 09:58 AM   HGBA1C 7.2 (H) 03/28/2019 04:11 PM    Kidney Function Lab Results  Component Value Date/Time   CREATININE 1.95 (H) 07/03/2020 10:28 AM   CREATININE 1.55 (H) 05/23/2020 10:48 AM   GFRNONAA 26 (L) 07/03/2020 10:28 AM   GFRAA 35 (L) 05/03/2020 01:28 PM    . Current antihyperglycemic regimen:  ? Januvia 71m daily ? Toujeo inject 45 units daily . What recent interventions/DTPs have been made to improve glycemic control:  o Encouraged to take Toujeo correctly and not adjust with blood glucose level . Have there been any recent hospitalizations or ED visits since last visit with CPP? No . Patient reports hypoglycemic symptoms, including Vision changes . Patient denies hyperglycemic symptoms, including none . How often are you checking your blood sugar? once daily . What are your blood sugars ranging?  o Fasting: 96,111,120, 148 o Before  meals:  o After meals:  o Bedtime:  . During the week, how often does your blood glucose drop below 70? Never . Are you checking your feet daily/regularly? Yes  Adherence Review: Is the patient currently on a STATIN medication? Yes Is the patient currently on ACE/ARB medication? Yes Does the patient have >5 day gap between last estimated fill dates? No   Pt says her blood glucose is "all over the place". She is taking between 40 and 50 units of Toujeo daily. Reviewed that Toujeo should be taken at 50 units daily regardless of blood glucose levels.   She report dark spots in her vision anytime blood glucose is in the 90's that go away when she eats something.   Hollie W [employer]

## 2020-07-07 ENCOUNTER — Other Ambulatory Visit (HOSPITAL_COMMUNITY)
Admission: RE | Admit: 2020-07-07 | Discharge: 2020-07-07 | Disposition: A | Discharge status: Home / Self Care | Payer: Medicare Other | Source: Ambulatory Visit | Attending: Internal Medicine | Admitting: Internal Medicine

## 2020-07-07 DIAGNOSIS — Z20822 Contact with and (suspected) exposure to covid-19: Secondary | ICD-10-CM | POA: Diagnosis not present

## 2020-07-07 DIAGNOSIS — Z01812 Encounter for preprocedural laboratory examination: Secondary | ICD-10-CM | POA: Diagnosis present

## 2020-07-07 LAB — SARS CORONAVIRUS 2 (TAT 6-24 HRS): SARS Coronavirus 2: NEGATIVE

## 2020-07-10 ENCOUNTER — Ambulatory Visit (HOSPITAL_COMMUNITY): Payer: Medicare Other | Admitting: Anesthesiology

## 2020-07-10 ENCOUNTER — Encounter (HOSPITAL_COMMUNITY)
Admission: RE | Disposition: A | Discharge status: Home / Self Care | Payer: Self-pay | Source: Home / Self Care | Attending: Internal Medicine

## 2020-07-10 ENCOUNTER — Encounter (HOSPITAL_COMMUNITY): Payer: Self-pay | Admitting: Internal Medicine

## 2020-07-10 ENCOUNTER — Ambulatory Visit (HOSPITAL_COMMUNITY)
Admission: RE | Admit: 2020-07-10 | Discharge: 2020-07-10 | Disposition: A | Discharge status: Home / Self Care | Payer: Medicare Other | Attending: Internal Medicine | Admitting: Internal Medicine

## 2020-07-10 DIAGNOSIS — Z86711 Personal history of pulmonary embolism: Secondary | ICD-10-CM | POA: Diagnosis not present

## 2020-07-10 DIAGNOSIS — I4892 Unspecified atrial flutter: Secondary | ICD-10-CM | POA: Diagnosis not present

## 2020-07-10 DIAGNOSIS — R42 Dizziness and giddiness: Secondary | ICD-10-CM | POA: Diagnosis not present

## 2020-07-10 DIAGNOSIS — N184 Chronic kidney disease, stage 4 (severe): Secondary | ICD-10-CM | POA: Insufficient documentation

## 2020-07-10 DIAGNOSIS — Z882 Allergy status to sulfonamides status: Secondary | ICD-10-CM | POA: Insufficient documentation

## 2020-07-10 DIAGNOSIS — I5032 Chronic diastolic (congestive) heart failure: Secondary | ICD-10-CM | POA: Insufficient documentation

## 2020-07-10 DIAGNOSIS — I272 Pulmonary hypertension, unspecified: Secondary | ICD-10-CM | POA: Diagnosis not present

## 2020-07-10 DIAGNOSIS — I4891 Unspecified atrial fibrillation: Secondary | ICD-10-CM | POA: Diagnosis not present

## 2020-07-10 DIAGNOSIS — Z880 Allergy status to penicillin: Secondary | ICD-10-CM | POA: Insufficient documentation

## 2020-07-10 DIAGNOSIS — Z79899 Other long term (current) drug therapy: Secondary | ICD-10-CM | POA: Diagnosis not present

## 2020-07-10 DIAGNOSIS — Z6841 Body Mass Index (BMI) 40.0 and over, adult: Secondary | ICD-10-CM | POA: Insufficient documentation

## 2020-07-10 DIAGNOSIS — Z794 Long term (current) use of insulin: Secondary | ICD-10-CM | POA: Insufficient documentation

## 2020-07-10 DIAGNOSIS — E1122 Type 2 diabetes mellitus with diabetic chronic kidney disease: Secondary | ICD-10-CM | POA: Insufficient documentation

## 2020-07-10 DIAGNOSIS — Z7901 Long term (current) use of anticoagulants: Secondary | ICD-10-CM | POA: Insufficient documentation

## 2020-07-10 DIAGNOSIS — I2510 Atherosclerotic heart disease of native coronary artery without angina pectoris: Secondary | ICD-10-CM | POA: Diagnosis not present

## 2020-07-10 DIAGNOSIS — I130 Hypertensive heart and chronic kidney disease with heart failure and stage 1 through stage 4 chronic kidney disease, or unspecified chronic kidney disease: Secondary | ICD-10-CM | POA: Insufficient documentation

## 2020-07-10 HISTORY — PX: CARDIOVERSION: SHX1299

## 2020-07-10 LAB — POCT I-STAT, CHEM 8
BUN: 44 mg/dL — ABNORMAL HIGH (ref 8–23)
BUN: 43 mg/dL — ABNORMAL HIGH (ref 8–23)
Calcium, Ion: 0.89 mmol/L — CL (ref 1.15–1.40)
Calcium, Ion: 1.07 mmol/L — ABNORMAL LOW (ref 1.15–1.40)
Chloride: 113 mmol/L — ABNORMAL HIGH (ref 98–111)
Chloride: 108 mmol/L (ref 98–111)
Creatinine, Ser: 1.6 mg/dL — ABNORMAL HIGH (ref 0.44–1.00)
Creatinine, Ser: 1.8 mg/dL — ABNORMAL HIGH (ref 0.44–1.00)
Glucose, Bld: 69 mg/dL — ABNORMAL LOW (ref 70–99)
Glucose, Bld: 74 mg/dL (ref 70–99)
HCT: 30 % — ABNORMAL LOW (ref 36.0–46.0)
HCT: 32 % — ABNORMAL LOW (ref 36.0–46.0)
Hemoglobin: 10.2 g/dL — ABNORMAL LOW (ref 12.0–15.0)
Hemoglobin: 10.9 g/dL — ABNORMAL LOW (ref 12.0–15.0)
Potassium: 3.9 mmol/L (ref 3.5–5.1)
Potassium: 4.3 mmol/L (ref 3.5–5.1)
Sodium: 143 mmol/L (ref 135–145)
Sodium: 143 mmol/L (ref 135–145)
TCO2: 22 mmol/L (ref 22–32)
TCO2: 26 mmol/L (ref 22–32)

## 2020-07-10 LAB — PROTIME-INR
INR: 2.9 — ABNORMAL HIGH (ref 0.8–1.2)
Prothrombin Time: 29 seconds — ABNORMAL HIGH (ref 11.4–15.2)

## 2020-07-10 LAB — GLUCOSE, CAPILLARY
Glucose-Capillary: 65 mg/dL — ABNORMAL LOW (ref 70–99)
Glucose-Capillary: 108 mg/dL — ABNORMAL HIGH (ref 70–99)

## 2020-07-10 SURGERY — CARDIOVERSION
Anesthesia: General

## 2020-07-10 MED ORDER — SODIUM CHLORIDE 0.9 % IV SOLN: INTRAVENOUS | Status: DC | PRN

## 2020-07-10 MED ORDER — DEXTROSE 50 % IV SOLN
INTRAVENOUS | Status: AC
  Filled 2020-07-10: qty 50

## 2020-07-10 MED ORDER — DEXTROSE 50 % IV SOLN
12.5000 g | INTRAVENOUS | Status: AC
  Administered 2020-07-10: 12.5 g via INTRAVENOUS

## 2020-07-10 MED ORDER — PROPOFOL 10 MG/ML IV BOLUS
INTRAVENOUS | Status: DC | PRN
  Administered 2020-07-10: 70 mg via INTRAVENOUS

## 2020-07-10 MED ORDER — EPHEDRINE SULFATE 50 MG/ML IJ SOLN
INTRAMUSCULAR | Status: DC | PRN
  Administered 2020-07-10: 10 mg via INTRAVENOUS

## 2020-07-10 MED ORDER — LIDOCAINE 2% (20 MG/ML) 5 ML SYRINGE
INTRAMUSCULAR | Status: DC | PRN
  Administered 2020-07-10: 100 mg via INTRAVENOUS

## 2020-07-10 NOTE — Anesthesia Postprocedure Evaluation (Signed)
Anesthesia Post Note  Patient: Ann Flynn  Procedure(s) Performed: CARDIOVERSION (N/A )     Patient location during evaluation: Endoscopy Anesthesia Type: General Level of consciousness: sedated Pain management: pain level controlled Vital Signs Assessment: post-procedure vital signs reviewed and stable Respiratory status: spontaneous breathing and respiratory function stable Cardiovascular status: stable Postop Assessment: no apparent nausea or vomiting Anesthetic complications: no   No complications documented.  Last Vitals:  Vitals:   07/10/20 1450 07/10/20 1500  BP: (!) 110/46 (!) 116/46  Pulse: (!) 59 66  Resp: 12 14  Temp:    SpO2: 94% 92%    Last Pain:  Vitals:   07/10/20 1500  TempSrc:   PainSc: 0-No pain                 Mitch Arquette DANIEL

## 2020-07-10 NOTE — Transfer of Care (Signed)
Immediate Anesthesia Transfer of Care Note  Patient: Ann Flynn  Procedure(s) Performed: CARDIOVERSION (N/A )  Patient Location: Endoscopy Unit  Anesthesia Type:General  Level of Consciousness: drowsy and patient cooperative  Airway & Oxygen Therapy: Patient Spontanous Breathing  Post-op Assessment: Report given to RN, Post -op Vital signs reviewed and stable and Patient moving all extremities X 4  Post vital signs: Reviewed and stable  Last Vitals:  Vitals Value Taken Time  BP 116/46 07/10/20 1500  Temp 37.1 C 07/10/20 1440  Pulse 80 07/10/20 1505  Resp 13 07/10/20 1503  SpO2 95 % 07/10/20 1505  Vitals shown include unvalidated device data.  Last Pain:  Vitals:   07/10/20 1450  TempSrc:   PainSc: 0-No pain         Complications: No complications documented.

## 2020-07-10 NOTE — Interval H&P Note (Signed)
History and Physical Interval Note:  07/10/2020 12:39 PM  Ann Flynn  has presented today for surgery, with the diagnosis of Aflutter.  The various methods of treatment have been discussed with the patient and family. After consideration of risks, benefits and other options for treatment, the patient has consented to  Procedure(s): CARDIOVERSION (N/A) as a surgical intervention.  The patient's history has been reviewed, patient examined, no change in status, stable for surgery.  I have reviewed the patient's chart and labs.  Questions were answered to the patient's satisfaction.     Ann Flynn

## 2020-07-10 NOTE — CV Procedure (Signed)
DIRECT CURRENT CARDIOVERSION  NAME:  Ann Flynn   MRN: [id number] DOB:  07-Nov-1940   ADMIT DATE: 07/10/2020   INDICATIONS: Atrial fibrillation/flutter    PROCEDURE:   Informed consent was obtained prior to the procedure. The risks, benefits and alternatives for the procedure were discussed and the patient comprehended these risks. Once an appropriate time out was taken, the patient had the defibrillator pads placed in the anterior and posterior position. The patient then underwent sedation by the anesthesia service. Once an appropriate level of sedation was achieved, the patient received a single biphasic, synchronized 200J shock with prompt conversion to sinus rhythm. No apparent complications.  Glori Bickers, MD  2:32 PM

## 2020-07-10 NOTE — Discharge Instructions (Signed)
Electrical Cardioversion Electrical cardioversion is the delivery of a jolt of electricity to restore a normal rhythm to the heart. A rhythm that is too fast or is not regular keeps the heart from pumping well. In this procedure, sticky patches or metal paddles are placed on the chest to deliver electricity to the heart from a device. This procedure may be done in an emergency if:  There is low or no blood pressure as a result of the heart rhythm.  Normal rhythm must be restored as fast as possible to protect the brain and heart from further damage.  It may save a life. This may also be a scheduled procedure for irregular or fast heart rhythms that are not immediately life-threatening. Tell a health care provider about:  Any allergies you have.  All medicines you are taking, including vitamins, herbs, eye drops, creams, and over-the-counter medicines.  Any problems you or family members have had with anesthetic medicines.  Any blood disorders you have.  Any surgeries you have had.  Any medical conditions you have.  Whether you are pregnant or may be pregnant. What are the risks? Generally, this is a safe procedure. However, problems may occur, including:  Allergic reactions to medicines.  A blood clot that breaks free and travels to other parts of your body.  The possible return of an abnormal heart rhythm within hours or days after the procedure.  Your heart stopping (cardiac arrest). This is rare. What happens before the procedure? Medicines  Your health care provider may have you start taking: ? Blood-thinning medicines (anticoagulants) so your blood does not clot as easily. ? Medicines to help stabilize your heart rate and rhythm.  Ask your health care provider about: ? Changing or stopping your regular medicines. This is especially important if you are taking diabetes medicines or blood thinners. ? Taking medicines such as aspirin and ibuprofen. These medicines can  thin your blood. Do not take these medicines unless your health care provider tells you to take them. ? Taking over-the-counter medicines, vitamins, herbs, and supplements. General instructions  Follow instructions from your health care provider about eating or drinking restrictions.  Plan to have someone take you home from the hospital or clinic.  If you will be going home right after the procedure, plan to have someone with you for 24 hours.  Ask your health care provider what steps will be taken to help prevent infection. These may include washing your skin with a germ-killing soap. What happens during the procedure?   An IV will be inserted into one of your veins.  Sticky patches (electrodes) or metal paddles may be placed on your chest.  You will be given a medicine to help you relax (sedative).  An electrical shock will be delivered. The procedure may vary among health care providers and hospitals. What can I expect after the procedure?  Your blood pressure, heart rate, breathing rate, and blood oxygen level will be monitored until you leave the hospital or clinic.  Your heart rhythm will be watched to make sure it does not change.  You may have some redness on the skin where the shocks were given. Follow these instructions at home:  Do not drive for 24 hours if you were given a sedative during your procedure.  Take over-the-counter and prescription medicines only as told by your health care provider.  Ask your health care provider how to check your pulse. Check it often.  Rest for 48 hours after the procedure or  as told by your health care provider.  Avoid or limit your caffeine use as told by your health care provider.  Keep all follow-up visits as told by your health care provider. This is important. Contact a health care provider if:  You feel like your heart is beating too quickly or your pulse is not regular.  You have a serious muscle cramp that does not go  away. Get help right away if:  You have discomfort in your chest.  You are dizzy or you feel faint.  You have trouble breathing or you are short of breath.  Your speech is slurred.  You have trouble moving an arm or leg on one side of your body.  Your fingers or toes turn cold or blue. Summary  Electrical cardioversion is the delivery of a jolt of electricity to restore a normal rhythm to the heart.  This procedure may be done right away in an emergency or may be a scheduled procedure if the condition is not an emergency.  Generally, this is a safe procedure.  After the procedure, check your pulse often as told by your health care provider. This information is not intended to replace advice given to you by your health care provider. Make sure you discuss any questions you have with your health care provider. Document Revised: 03/07/2019 Document Reviewed: 03/07/2019 Elsevier Patient Education  [place].

## 2020-07-10 NOTE — Anesthesia Preprocedure Evaluation (Addendum)
Anesthesia Evaluation  Patient identified by MRN, date of birth, ID band Patient awake    Reviewed: Allergy & Precautions, NPO status , Patient's Chart, lab work & pertinent test results  History of Anesthesia Complications Negative for: history of anesthetic complications  Airway Mallampati: II  TM Distance: >3 FB Neck ROM: Full    Dental no notable dental hx. (+) Dental Advisory Given   Pulmonary    Pulmonary exam normal        Cardiovascular hypertension,  Rhythm:Irregular Rate:Normal     Neuro/Psych    GI/Hepatic   Endo/Other  diabetes  Renal/GU      Musculoskeletal   Abdominal   Peds  Hematology   Anesthesia Other Findings   Reproductive/Obstetrics                            Anesthesia Physical Anesthesia Plan  ASA: III  Anesthesia Plan: General   Post-op Pain Management:    Induction: Intravenous  PONV Risk Score and Plan: 3 and Propofol infusion  Airway Management Planned: Mask  Additional Equipment:   Intra-op Plan:   Post-operative Plan:   Informed Consent: I have reviewed the patients History and Physical, chart, labs and discussed the procedure including the risks, benefits and alternatives for the proposed anesthesia with the patient or authorized representative who has indicated his/her understanding and acceptance.     Dental advisory given  Plan Discussed with: Anesthesiologist, Surgeon and CRNA  Anesthesia Plan Comments:         Anesthesia Quick Evaluation

## 2020-07-11 ENCOUNTER — Encounter (HOSPITAL_COMMUNITY): Payer: Self-pay | Admitting: Internal Medicine

## 2020-07-17 ENCOUNTER — Telehealth: Payer: Self-pay

## 2020-07-17 NOTE — Progress Notes (Signed)
Chronic Care Management Pharmacy Assistant   Name: Ann Flynn  MRN: [id number] DOB: 1940/12/22  Reason for Encounter: Medication Review  Patient Questions:  1.  Have you seen any other providers since your last visit? Yes, Ann Flynn  2.  Any changes in your medicines or health? Yes, Cardioversion procedure performed 07/10/2020   Ann Flynn,  79 y.o. , female presents for their Follow-Up CCM visit with the clinical pharmacist via telephone.  PCP : Ann Post, PA-C  Allergies:   Allergies  Allergen Reactions  . Penicillins Swelling    Facial swelling Did it involve swelling of the face/tongue/throat, SOB, or low BP? Yes Did it involve sudden or severe rash/hives, skin peeling, or any reaction on the inside of your mouth or nose? No Did you need to seek medical attention at a hospital or doctor's office? No When did it last happen?10 + years If all above answers are "NO", may proceed with cephalosporin use.   . Sulfa Antibiotics Rash    Medications: Outpatient Encounter Medications as of 07/17/2020  Medication Sig Note  . acetaminophen (TYLENOL) 650 MG CR tablet Take 650-1,300 mg by mouth every 8 (eight) hours as needed for pain.   Ann Flynn ambrisentan (LETAIRIS) 5 MG tablet Take 5 mg by mouth at bedtime.   Ann Flynn atorvastatin (LIPITOR) 80 MG tablet TAKE 1 TABLET BY MOUTH DAILY (Patient taking differently: Take 80 mg by mouth at bedtime. )   . BD PEN NEEDLE NANO U/F 32G X 4 MM MISC USE TO INJECT ONCE DAILY AS DIRECTED   . Cholecalciferol (VITAMIN D3) 50 MCG (2000 UT) TABS Take 2,000 Units by mouth in the morning and at bedtime.   . docusate sodium (COLACE) 100 MG capsule Take 100 mg by mouth daily as needed (constipation.).    Ann Flynn empagliflozin (JARDIANCE) 10 MG TABS tablet Take 1 tablet (10 mg total) by mouth daily before breakfast. 07/04/2020: New medication patient has not started   . FEROSUL 325 (65 Fe) MG tablet TAKE 1 TABLET BY MOUTH 3 TIMES DAILY WITH MEALS  (Patient taking differently: Take 325 mg by mouth in the morning, at noon, and at bedtime. )   . JANUVIA 25 MG tablet TAKE ONE TABLET BY MOUTH EVERY DAY (Patient taking differently: Take 25 mg by mouth daily. )   . losartan (COZAAR) 50 MG tablet Take 1 tablet (50 mg total) by mouth daily. (Patient taking differently: Take 50 mg by mouth every evening. )   . metoprolol tartrate (LOPRESSOR) 25 MG tablet TAKE 1 TABLET BY MOUTH TWICE DAILY (Patient taking differently: Take 25 mg by mouth 2 (two) times daily. )   . omeprazole (PRILOSEC) 40 MG capsule TAKE 1 CAPSULE BY MOUTH ONCE DAILY (Patient taking differently: Take 40 mg by mouth at bedtime. )   . ONETOUCH ULTRA test strip USE AS DIRECTED   . tadalafil, PAH, (ADCIRCA) 20 MG tablet Take 2 tablets (40 mg total) by mouth at bedtime.   . torsemide (DEMADEX) 20 MG tablet Take 1 tablet (20 mg total) by mouth daily.   Ann Flynn SOLOSTAR 300 UNIT/ML Solostar Pen INJECT 50 UNITS INTO THE SKIN DAILY (Patient taking differently: Inject 50 Units into the skin at bedtime. )   . venlafaxine (EFFEXOR) 75 MG tablet Take 75 mg by mouth daily.    . vitamin B-12 (CYANOCOBALAMIN) 1000 MCG tablet Take 1,000 mcg by mouth in the morning and at bedtime.   Ann Flynn warfarin (COUMADIN) 3 MG tablet Take 3 mg on  Wed and Saturday (Patient taking differently: Take 3 mg by mouth 2 (two) times a week. Take 1 tablet (3 mg) by mouth on Wednesdays & Saturdays in the evening.)   . warfarin (COUMADIN) 4 MG tablet Take 4 mg on Sun, Mon, Tues, Thurs, Friday. (Patient taking differently: Take 4 mg by mouth See admin instructions. Take 1 tablet (4 mg) by mouth on Sundays, Mondays, Tuesdays, Thursdays & Fridays in the evening.)    No facility-administered encounter medications on file as of 07/17/2020.    Current Diagnosis: Patient Active Problem List   Diagnosis Date Noted  . Atrial fibrillation, chronic ([place]) 03/24/2020  . Type 2 diabetes mellitus with stage 4 chronic kidney disease ([place])  03/24/2020  . Syncope 03/24/2020  . Pulmonary hypertension ([place]) 03/24/2020  . Subdural hematoma, acute ([place]) 03/24/2020  . Pulmonary nodules 01/30/2020  . Chronic pulmonary embolism, unspecified pulmonary embolism type, unspecified whether acute cor pulmonale present ([place]) 10/06/2019  . Chronic respiratory failure with hypoxia ([place]) 10/06/2019  . Morbid (severe) obesity due to excess calories ([place]) 10/06/2019  . Hypertension associated with diabetes ([place]) 10/06/2019  . Hyperlipidemia associated with type 2 diabetes mellitus ([place]) 10/06/2019  . CKD (chronic kidney disease), stage IV ([place]) 04/01/2019  . Allergic rhinitis 03/28/2019  . Arthritis 03/28/2019  . CHF (congestive heart failure) ([place]) 03/28/2019  . Diabetes mellitus without complication ([place]) 99/96/7227  . Sleep apnea 03/28/2019  . Pulmonary hypertension, primary ([place]) 03/28/2019    Goals Addressed   None     Follow-Up:  Coordination of Enhanced Pharmacy Services  Reviewed chart and adherence measures. Per insurance data, no adherence information to be found.

## 2020-07-18 ENCOUNTER — Encounter: Payer: Self-pay | Admitting: Dietician

## 2020-07-18 DIAGNOSIS — R55 Syncope and collapse: Secondary | ICD-10-CM

## 2020-07-18 HISTORY — DX: Syncope and collapse: R55

## 2020-07-23 ENCOUNTER — Telehealth: Payer: Self-pay

## 2020-07-23 NOTE — Progress Notes (Signed)
Established patient visit   Patient: Ann Flynn   DOB: 1941-05-30   79 y.o. Female  MRN: [id number] Visit Date: 07/24/2020  Today's healthcare provider: Trinna Post, PA-C   Chief Complaint  Patient presents with  . Diabetes  . Hyperlipidemia  . Hypertension  I,Irais Mottram M Kamika Goodloe,acting as a scribe for [employer], PA-C.,have documented all relevant documentation on the behalf of Trinna Post, PA-C,as directed by  Trinna Post, PA-C while in the presence of Trinna Post, PA-C.  Subjective    HPI   She has had another fall in the interim. She reports feeling dizzy and unsteady, which lead to her falls. Patient has some anxiety regarding her health status and repeated falls. She has also underwent cardioversion with Dr. Elmer Ramp. She was in NSR last time she was examined. Continues warfarin due to other complications including stroke/PE.   Diabetes Mellitus Type II, follow-up  Lab Results  Component Value Date   HGBA1C 7.0 (A) 07/24/2020   HGBA1C 7.9 (H) 03/24/2020   HGBA1C 7.3 (A) 01/18/2020   Last seen for diabetes 3 months ago.  Management since then includes continuing the same treatment. She reports good compliance with treatment. She is not having side effects.   Home blood sugar records: fasting range: 90's-130's  Episodes of hypoglycemia? No    Current insulin regiment:Toujeo Most Recent Eye Exam: 09/28/2019  --------------------------------------------------------------------------------------------------- Hypertension, follow-up  BP Readings from Last 3 Encounters:  07/24/20 (!) 112/53  07/10/20 (!) 116/46  07/03/20 130/80   Wt Readings from Last 3 Encounters:  07/24/20 249 lb 3.2 oz (113 kg)  07/10/20 241 lb (109.3 kg)  07/03/20 247 lb 9.6 oz (112.3 kg)     She was last seen for hypertension 3 months ago.  BP at that visit was 118/62. Management since that visit includes decreased amlodipine in order to meet blood pressure  goals as stated by nephrologist. She reports good compliance with treatment. She is not having side effects.  She is not exercising. She is adherent to low salt diet.   Outside blood pressures are arranging around 120's-140's/80's-90's.  She does not smoke.  Use of agents associated with hypertension: none.   --------------------------------------------------------------------------------------------------- Lipid/Cholesterol, follow-up  Last Lipid Panel: Lab Results  Component Value Date   CHOL 104 01/18/2020   LDLCALC 46 01/18/2020   HDL 43 01/18/2020   TRIG 68 01/18/2020    She was last seen for this 3 months ago.  Management since that visit includes continue current medication.  She reports good compliance with treatment. She is not having side effects.   Symptoms: No appetite changes No foot ulcerations  No chest pain No chest pressure/discomfort  No dyspnea No orthopnea  No fatigue No lower extremity edema  No palpitations No paroxysmal nocturnal dyspnea  No nausea No numbness or tingling of extremity  No polydipsia No polyuria  No speech difficulty No syncope   She is following a Regular diet. Current exercise: no regular exercise  Last metabolic panel Lab Results  Component Value Date   GLUCOSE 178 (H) 07/24/2020   NA 143 07/24/2020   K 4.8 07/24/2020   BUN 46 (H) 07/24/2020   CREATININE 1.85 (H) 07/24/2020   GFRNONAA 26 (L) 07/24/2020   GFRAA 29 (L) 07/24/2020   CALCIUM 8.8 07/24/2020   AST 8 07/24/2020   ALT 8 07/24/2020   The ASCVD Risk score Mikey Bussing DC Jr., et al., 2013) failed to calculate for the following reasons:  The patient has a prior MI or stroke diagnosis  ---------------------------------------------------------------------------------------------------      Medications: Outpatient Medications Prior to Visit  Medication Sig  . acetaminophen (TYLENOL) 650 MG CR tablet Take 650-1,300 mg by mouth every 8 (eight) hours as needed for  pain.  [name] ambrisentan (LETAIRIS) 5 MG tablet Take 5 mg by mouth at bedtime.  [name] atorvastatin (LIPITOR) 80 MG tablet TAKE 1 TABLET BY MOUTH DAILY (Patient taking differently: Take 80 mg by mouth at bedtime. )  . BD PEN NEEDLE NANO U/F 32G X 4 MM MISC USE TO INJECT ONCE DAILY AS DIRECTED  . Cholecalciferol (VITAMIN D3) 50 MCG (2000 UT) TABS Take 2,000 Units by mouth in the morning and at bedtime.  . docusate sodium (COLACE) 100 MG capsule Take 100 mg by mouth daily as needed (constipation.).   [name] empagliflozin (JARDIANCE) 10 MG TABS tablet Take 1 tablet (10 mg total) by mouth daily before breakfast.  . FEROSUL 325 (65 Fe) MG tablet TAKE 1 TABLET BY MOUTH 3 TIMES DAILY WITH MEALS (Patient taking differently: Take 325 mg by mouth in the morning, at noon, and at bedtime. )  . JANUVIA 25 MG tablet TAKE ONE TABLET BY MOUTH EVERY DAY (Patient taking differently: Take 25 mg by mouth daily. )  . metoprolol tartrate (LOPRESSOR) 25 MG tablet TAKE 1 TABLET BY MOUTH TWICE DAILY (Patient taking differently: Take 25 mg by mouth 2 (two) times daily. )  . omeprazole (PRILOSEC) 40 MG capsule TAKE 1 CAPSULE BY MOUTH ONCE DAILY (Patient taking differently: Take 40 mg by mouth at bedtime. )  . ONETOUCH ULTRA test strip USE AS DIRECTED  . tadalafil, PAH, (ADCIRCA) 20 MG tablet Take 2 tablets (40 mg total) by mouth at bedtime.  . torsemide (DEMADEX) 20 MG tablet Take 1 tablet (20 mg total) by mouth daily.  [name] SOLOSTAR 300 UNIT/ML Solostar Pen INJECT 50 UNITS INTO THE SKIN DAILY (Patient taking differently: Inject 50 Units into the skin at bedtime. )  . venlafaxine (EFFEXOR) 75 MG tablet Take 75 mg by mouth daily.   . vitamin B-12 (CYANOCOBALAMIN) 1000 MCG tablet Take 1,000 mcg by mouth in the morning and at bedtime.  [name] warfarin (COUMADIN) 3 MG tablet Take 3 mg on Wed and Saturday (Patient taking differently: Take 3 mg by mouth 2 (two) times a week. Take 1 tablet (3 mg) by mouth on Wednesdays & Saturdays in the evening.)   . warfarin (COUMADIN) 4 MG tablet Take 4 mg on Sun, Mon, Tues, Thurs, Friday. (Patient taking differently: Take 4 mg by mouth See admin instructions. Take 1 tablet (4 mg) by mouth on Sundays, Mondays, Tuesdays, Thursdays & Fridays in the evening.)  . [DISCONTINUED] losartan (COZAAR) 50 MG tablet Take 1 tablet (50 mg total) by mouth daily. (Patient taking differently: Take 50 mg by mouth every evening. )   No facility-administered medications prior to visit.    Review of Systems  Constitutional: Negative.   Respiratory: Negative.   Cardiovascular: Negative.   Hematological: Negative.       Objective    BP (!) 112/53 (BP Location: Left Arm, Patient Position: Sitting, Cuff Size: Large)   Pulse (!) 51   Temp 98 F (36.7 C) (Oral)   Wt 249 lb 3.2 oz (113 kg)   SpO2 99% Comment: with oxygen  BMI 41.47 kg/m    Physical Exam Constitutional:      Appearance: Normal appearance.  Cardiovascular:     Rate and Rhythm: Normal rate  and regular rhythm.     Pulses: Normal pulses.     Heart sounds: Normal heart sounds.  Pulmonary:     Effort: Pulmonary effort is normal.     Breath sounds: Normal breath sounds.  Skin:    General: Skin is warm and dry.  Neurological:     General: No focal deficit present.     Mental Status: She is alert and oriented to person, place, and time.  Psychiatric:        Mood and Affect: Mood normal.        Behavior: Behavior normal.       Results for orders placed or performed in visit on 07/24/20  CBC with Differential  Result Value Ref Range   WBC 7.3 3.4 - 10.8 x10E3/uL   RBC 3.39 (L) 3.77 - 5.28 x10E6/uL   Hemoglobin 10.0 (L) 11.1 - 15.9 g/dL   Hematocrit 31.9 (L) 34.0 - 46.6 %   MCV 94 79 - 97 fL   MCH 29.5 26.6 - 33.0 pg   MCHC 31.3 (L) 31.5 - 35.7 g/dL   RDW 13.6 11.7 - 15.4 %   Platelets 280 150 - 450 x10E3/uL   Neutrophils 68 Not Estab. %   Lymphs 21 Not Estab. %   Monocytes 7 Not Estab. %   Eos 3 Not Estab. %   Basos 1 Not Estab. %    Neutrophils Absolute 5.0 1.4 - 7.0 x10E3/uL   Lymphocytes Absolute 1.5 0.7 - 3.1 x10E3/uL   Monocytes Absolute 0.5 0.1 - 0.9 x10E3/uL   EOS (ABSOLUTE) 0.2 0.0 - 0.4 x10E3/uL   Basophils Absolute 0.1 0.0 - 0.2 x10E3/uL   Immature Granulocytes 0 Not Estab. %   Immature Grans (Abs) 0.0 0.0 - 0.1 x10E3/uL  Comprehensive Metabolic Panel (CMET)  Result Value Ref Range   Glucose 178 (H) 65 - 99 mg/dL   BUN 46 (H) 8 - 27 mg/dL   Creatinine, Ser 1.85 (H) 0.57 - 1.00 mg/dL   GFR calc non Af Amer 26 (L) >59 mL/min/1.73   GFR calc Af Amer 29 (L) >59 mL/min/1.73   BUN/Creatinine Ratio 25 12 - 28   Sodium 143 134 - 144 mmol/L   Potassium 4.8 3.5 - 5.2 mmol/L   Chloride 106 96 - 106 mmol/L   CO2 25 20 - 29 mmol/L   Calcium 8.8 8.7 - 10.3 mg/dL   Total Protein 5.8 (L) 6.0 - 8.5 g/dL   Albumin 3.8 3.7 - 4.7 g/dL   Globulin, Total 2.0 1.5 - 4.5 g/dL   Albumin/Globulin Ratio 1.9 1.2 - 2.2   Bilirubin Total 0.2 0.0 - 1.2 mg/dL   Alkaline Phosphatase 79 44 - 121 IU/L   AST 8 0 - 40 IU/L   ALT 8 0 - 32 IU/L  INR/PT  Result Value Ref Range   INR 1.9 (H) 0.9 - 1.2   Prothrombin Time 19.5 (H) 9.1 - 12.0 sec  POCT glycosylated hemoglobin (Hb A1C)  Result Value Ref Range   Hemoglobin A1C 7.0 (A) 4.0 - 5.6 %   HbA1c POC (<> result, manual entry)     HbA1c, POC (prediabetic range)     HbA1c, POC (controlled diabetic range)     Est. average glucose Bld gHb Est-mCnc 154     Assessment & Plan    1. Hyperlipidemia associated with type 2 diabetes mellitus (HCC)  Continue Lipitor 80 mg QHS.  2. CKD (chronic kidney disease), stage IV (HCC)  Cr a little worse than last  time. We will decrease her Losartan to 25 mg daily due to likely hypoperfusion. She has follow up nephrology in 1 month.   - CBC with Differential - Comprehensive Metabolic Panel (CMET)  3. Type 2 diabetes mellitus with stage 4 chronic kidney disease, with long-term current use of insulin (HCC)  Well controlled, continue current  medications.   - POCT glycosylated hemoglobin (Hb A1C) - CBC with Differential - Comprehensive Metabolic Panel (CMET)  4. Hypertension associated with diabetes ([place])  - losartan (COZAAR) 25 MG tablet; Take 1 tablet (25 mg total) by mouth daily.  Dispense: 90 tablet; Refill: 1  5. Depression, unspecified depression type  Continue current medications.  6. Iron deficiency anemia, unspecified iron deficiency anemia type  She did not want to pursue further workup of this, monitor.   7. Atrial fibrillation, chronic ([place])  She was cardioverted, regular rhythm today.   - INR/PT  8. Congestive heart failure, unspecified HF chronicity, unspecified heart failure type ([place])   9. Need for influenza vaccination  - Flu Vaccine QUAD High Dose(Fluad)  10. Advanced care planning/counseling discussion  Discussed independent living status, options for more socialization. Discussed home health aides will likely not be paid for my medicare but may be picked up by supplemental or have affordable out of pocket prices. Discussed looking into assisted living or long term care as I anticipate independent living will become more challenging and/or risky for her.     No follow-ups on file.      ITrinna Post, PA-C, have reviewed all documentation for this visit. The documentation on 07/27/20 for the exam, diagnosis, procedures, and orders are all accurate and complete.  The entirety of the information documented in the History of Present Illness, Review of Systems and Physical Exam were personally obtained by me. Portions of this information were initially documented by [facility] and reviewed by me for thoroughness and accuracy.     Paulene Floor  [facility] [phone] (phone) [phone] (fax)  [place]

## 2020-07-23 NOTE — Progress Notes (Signed)
Chronic Care Management Pharmacy Assistant   Name: Arlen Legendre  MRN: [id number] DOB: [birth year]  Reason for Encounter: Disease State and Medication Review  Patient Questions:  1.  Have you seen any other providers since your last visit? Yes, Adrianan Pollak  2.  Any changes in your medicines or health? Yes, begin Losartan 25 mg table-take one tablet by mouth daily. Awaiting labs to determine iron level and need for iron.      PCP : Trinna Post, PA-C  Allergies:   Allergies  Allergen Reactions  . Penicillins Swelling    Facial swelling Did it involve swelling of the face/tongue/throat, SOB, or low BP? Yes Did it involve sudden or severe rash/hives, skin peeling, or any reaction on the inside of your mouth or nose? No Did you need to seek medical attention at a hospital or doctor's office? No When did it last happen?10 + years If all above answers are "NO", may proceed with cephalosporin use.   . Sulfa Antibiotics Rash    Medications: Outpatient Encounter Medications as of 07/23/2020  Medication Sig Note  . acetaminophen (TYLENOL) 650 MG CR tablet Take 650-1,300 mg by mouth every 8 (eight) hours as needed for pain.   [name] ambrisentan (LETAIRIS) 5 MG tablet Take 5 mg by mouth at bedtime.   [name] atorvastatin (LIPITOR) 80 MG tablet TAKE 1 TABLET BY MOUTH DAILY (Patient taking differently: Take 80 mg by mouth at bedtime. )   . BD PEN NEEDLE NANO U/F 32G X 4 MM MISC USE TO INJECT ONCE DAILY AS DIRECTED   . Cholecalciferol (VITAMIN D3) 50 MCG (2000 UT) TABS Take 2,000 Units by mouth in the morning and at bedtime.   . docusate sodium (COLACE) 100 MG capsule Take 100 mg by mouth daily as needed (constipation.).    [name] empagliflozin (JARDIANCE) 10 MG TABS tablet Take 1 tablet (10 mg total) by mouth daily before breakfast. 07/04/2020: New medication patient has not started   . FEROSUL 325 (65 Fe) MG tablet TAKE 1 TABLET BY MOUTH 3 TIMES DAILY WITH MEALS (Patient taking  differently: Take 325 mg by mouth in the morning, at noon, and at bedtime. )   . JANUVIA 25 MG tablet TAKE ONE TABLET BY MOUTH EVERY DAY (Patient taking differently: Take 25 mg by mouth daily. )   . metoprolol tartrate (LOPRESSOR) 25 MG tablet TAKE 1 TABLET BY MOUTH TWICE DAILY (Patient taking differently: Take 25 mg by mouth 2 (two) times daily. )   . omeprazole (PRILOSEC) 40 MG capsule TAKE 1 CAPSULE BY MOUTH ONCE DAILY (Patient taking differently: Take 40 mg by mouth at bedtime. )   . ONETOUCH ULTRA test strip USE AS DIRECTED   . tadalafil, PAH, (ADCIRCA) 20 MG tablet Take 2 tablets (40 mg total) by mouth at bedtime.   . torsemide (DEMADEX) 20 MG tablet Take 1 tablet (20 mg total) by mouth daily.   [name] SOLOSTAR 300 UNIT/ML Solostar Pen INJECT 50 UNITS INTO THE SKIN DAILY (Patient taking differently: Inject 50 Units into the skin at bedtime. )   . venlafaxine (EFFEXOR) 75 MG tablet Take 75 mg by mouth daily.    . vitamin B-12 (CYANOCOBALAMIN) 1000 MCG tablet Take 1,000 mcg by mouth in the morning and at bedtime.   [name] warfarin (COUMADIN) 3 MG tablet Take 3 mg on Wed and Saturday (Patient taking differently: Take 3 mg by mouth 2 (two) times a week. Take 1 tablet (3 mg) by mouth on Wednesdays &  Saturdays in the evening.)   . warfarin (COUMADIN) 4 MG tablet Take 4 mg on Sun, Mon, Tues, Thurs, Friday. (Patient taking differently: Take 4 mg by mouth See admin instructions. Take 1 tablet (4 mg) by mouth on Sundays, Mondays, Tuesdays, Thursdays & Fridays in the evening.)   . [DISCONTINUED] losartan (COZAAR) 50 MG tablet Take 1 tablet (50 mg total) by mouth daily. (Patient taking differently: Take 50 mg by mouth every evening. )    No facility-administered encounter medications on file as of 07/23/2020.    Current Diagnosis: Patient Active Problem List   Diagnosis Date Noted  . Atrial fibrillation, chronic ([place]) 03/24/2020  . Type 2 diabetes mellitus with stage 4 chronic kidney disease ([place])  03/24/2020  . Syncope 03/24/2020  . Pulmonary hypertension ([place]) 03/24/2020  . Subdural hematoma, acute ([place]) 03/24/2020  . Pulmonary nodules 01/30/2020  . Chronic pulmonary embolism, unspecified pulmonary embolism type, unspecified whether acute cor pulmonale present ([place]) 10/06/2019  . Chronic respiratory failure with hypoxia ([place]) 10/06/2019  . Morbid (severe) obesity due to excess calories ([place]) 10/06/2019  . Hypertension associated with diabetes ([place]) 10/06/2019  . Hyperlipidemia associated with type 2 diabetes mellitus ([place]) 10/06/2019  . CKD (chronic kidney disease), stage IV ([place]) 04/01/2019  . Allergic rhinitis 03/28/2019  . Arthritis 03/28/2019  . CHF (congestive heart failure) ([place]) 03/28/2019  . Diabetes mellitus without complication ([place]) 86/75/4492  . Sleep apnea 03/28/2019  . Pulmonary hypertension, primary ([place]) 03/28/2019    Goals Addressed   None     Follow-Up:  [facility]ination of [facility] Pharmacy Services  Recent Relevant Labs: Lab Results  Component Value Date/Time   HGBA1C 7.0 (A) 07/24/2020 11:19 AM   HGBA1C 7.9 (H) 03/24/2020 06:15 AM   HGBA1C 7.3 (A) 01/18/2020 01:12 PM   HGBA1C 7.2 (H) 03/28/2019 04:11 PM    Kidney Function Lab Results  Component Value Date/Time   CREATININE 1.80 (H) 07/10/2020 12:45 PM   CREATININE 1.60 (H) 07/10/2020 12:22 PM   GFRNONAA 26 (L) 07/03/2020 10:28 AM   GFRAA 35 (L) 05/03/2020 01:28 PM    . Current antihyperglycemic regimen:  o Januvia 25 mg daily and Toujeo inject 45 units daily . What recent interventions/DTPs have been made to improve glycemic control:  o No recent interventions . Have there been any recent hospitalizations or ED visits since last visit with CPP? No . Patient denies hypoglycemic symptoms . Patient reports hyperglycemic symptoms, including excessive thirst, fatigue, polyuria and Pt reports that when she experiences hyperglycemic sxs that she always knows what it was caused by. . How often are you  checking your blood sugar? once daily . What are your blood sugars ranging? Pt turned in her blood glucose logs when she was seen by Carles Collet today. She does state that her A1C level at today's appointment was 7.  . During the week, how often does your blood glucose drop below 70? Never . Are you checking your feet daily/regularly? Yes  Adherence Review: Is the patient currently on a STATIN medication? Yes Is the patient currently on ACE/ARB medication? Yes Does the patient have >5 day gap between last estimated fill dates? Yes while on phone pt converted to [facility] compliance packaging.   Reviewed all medication and timing with patient. [facility] for last fill date on all medication, to request patient profile, and to cancel autofill. Patient needs to have short fill of Losartan 25 mg and Atorvastatin 80 mg  to be delivered on 07/25/2020 to sync  with adherence delivery to be made on 08/02/2020. Acute fill requested.    Pt receives ambrisentan and tadalafil through a grant with [employer].   Pt declines all OTC medications at this time. Has 0.5 bottle of blood glucose test strips left. Ferosul causes stomach upset and depending on iron levels done on 07/24/2020 may be d/c. Pt states venlafaxine last filled by Carles Collet on 02/10/2020 but notes do not list it (listed as historical provider). Cristie Hem is seeking clarification so that future refill requests are sent to the correct person.  Medication Name CPP Transfer (put Dr.'s name) Timing Last Fill Date & Day Supply Format: MM/DD/YY - DS (If last fill/DS unavailable, list pt.'s quantity on hand) Anticipated next due date  Format: MM/DD/YY      BB  B  L  EM  BT    Acetaminophen 650 mg CR tab- Take 1 to 2 tabs by mouth every eight hours as needed for pain (PRN) OTC        Will check during monthly call  Ambrisentan 5 mg tablet- take 5 mg by mouth at bedtime (BEDTIME) GRANT        Receives via PAP  Atorvastatin 80 mg tablet-  Take one tablet by mouth daily X      1 06/22/2020 30 days 07/23/2020  BD pen Needle Nano u/f 32Gx 4 mm- Use to inject once daily as directed with Toujeo x        Will check during monthly call  Alcohol Pads-use as directed OTC        Will check during monthly call  Vitamin D3 50 mcg (2000 units)- Take one tablet daily OTC        Will check during monthly call  Docusate sodium 169m capsule- Take 1 capsule by mouth for constipation (PRN) OTC        Will check during monthly call  Empagliflozin 10 mg tabs- Take 1 tablet by mouth daily before breakfast  Daniel Bensimhon 1     06/30/2020 30days 07/30/2020  Ferosul 325 mg tablet- take 1 tablet by mouth three times daily with meals x   _0 Will check during monthly call  Januvia 25 mg tablet Take one tablet by mouth daily x   1    06/22/2020 30 days  07/23/2020  Losartan 25 mg tablet- Take 1 tablet by mouth  X      1 Acute fill needed for delivery 07/26/2020 to sync with delivery on 08/02/2020   Metoprolol tartrate 25 mg tablet- Take 1 tablet by mouth two times daily x   1   1 05/04/2020 90 days 08/02/2020  Omeprazole 40 mg capsule0 take 1 capsule by mouth  x   1    05/04/2020 90 days 08/02/2020  OneTouch Ultra test strips-use as directed x        Will check during monthly call  Tadalafil, PAH, 20 mg tablet-take 2 tablet by mouth at bedtime GRANT        Will check during monthly call  Torsemide 20 mg tablet-take one tablet by mouth daily  Daniel Bensimhon  1    04/20/2020 90 days 08/02/2020  Toujeo Solostar 300 unit/ml- Inject 50 units into skin daily X      1 06/29/2020 27 days 07/26/2020  Venlafaxine 75 mg tablet- take 75 mg by mouth daily x   1    06/22/2020 30 days 07/23/2020  Vitamin B-12 10075m- Take 1000 mcg by mouth daily OTC  Will check during monthly call  Warfarin 3 mg tablet-Take 3 mg by mouth 2 times a week (Wed and Sat) x      1 06/22/2020 30 days 07/23/2020  Warfarin 4 mg tablet- Take 4 mg by mouth 5 days a week as directed  (Sunday, Monday, Tuesday, Thursday, and Friday) x      1 06/22/2020 30 days 07/23/2020             Pt aware of the need to short fill some medications in order to sync all dates. Pharmacy updated by Cristie Hem in Epic to reflect [facility] as preferred pharmacy. Ms. Kofman is aware that we are able to add in her OTC supplements at any point if she chooses.   Pt is aware that pharmacy will contact her day of delivery Expected delivery date for adherence packaging 08/02/2020. Patient expected to sync on 08/02/2020 for medication and delivery moving forward.     Hollie W [employer]

## 2020-07-24 ENCOUNTER — Other Ambulatory Visit: Payer: Self-pay

## 2020-07-24 ENCOUNTER — Encounter: Payer: Self-pay | Admitting: Physician Assistant

## 2020-07-24 ENCOUNTER — Ambulatory Visit (INDEPENDENT_AMBULATORY_CARE_PROVIDER_SITE_OTHER): Payer: Medicare Other | Admitting: Physician Assistant

## 2020-07-24 VITALS — BP 112/53 | HR 51 | Temp 98.0°F | Wt 249.2 lb

## 2020-07-24 DIAGNOSIS — N184 Chronic kidney disease, stage 4 (severe): Secondary | ICD-10-CM | POA: Diagnosis not present

## 2020-07-24 DIAGNOSIS — I152 Hypertension secondary to endocrine disorders: Secondary | ICD-10-CM

## 2020-07-24 DIAGNOSIS — Z23 Encounter for immunization: Secondary | ICD-10-CM | POA: Diagnosis not present

## 2020-07-24 DIAGNOSIS — I482 Chronic atrial fibrillation, unspecified: Secondary | ICD-10-CM

## 2020-07-24 DIAGNOSIS — E1169 Type 2 diabetes mellitus with other specified complication: Secondary | ICD-10-CM

## 2020-07-24 DIAGNOSIS — E785 Hyperlipidemia, unspecified: Secondary | ICD-10-CM

## 2020-07-24 DIAGNOSIS — E1159 Type 2 diabetes mellitus with other circulatory complications: Secondary | ICD-10-CM

## 2020-07-24 DIAGNOSIS — F32A Depression, unspecified: Secondary | ICD-10-CM

## 2020-07-24 DIAGNOSIS — D509 Iron deficiency anemia, unspecified: Secondary | ICD-10-CM

## 2020-07-24 DIAGNOSIS — E1122 Type 2 diabetes mellitus with diabetic chronic kidney disease: Secondary | ICD-10-CM

## 2020-07-24 DIAGNOSIS — Z794 Long term (current) use of insulin: Secondary | ICD-10-CM | POA: Diagnosis not present

## 2020-07-24 DIAGNOSIS — Z7189 Other specified counseling: Secondary | ICD-10-CM

## 2020-07-24 DIAGNOSIS — I509 Heart failure, unspecified: Secondary | ICD-10-CM

## 2020-07-24 LAB — POCT GLYCOSYLATED HEMOGLOBIN (HGB A1C)
Est. average glucose Bld gHb Est-mCnc: 154
Hemoglobin A1C: 7 % — AB (ref 4.0–5.6)

## 2020-07-24 MED ORDER — LOSARTAN POTASSIUM 25 MG PO TABS: 25.0000 mg | ORAL_TABLET | Freq: Every day | ORAL | 1 refills | Status: DC

## 2020-07-24 NOTE — Patient Instructions (Signed)
Iron Deficiency Anemia, Adult Iron-deficiency anemia is when you have a low amount of red blood cells or hemoglobin. This happens because you have too little iron in your body. Hemoglobin carries oxygen to parts of the body. Anemia can cause your body to not get enough oxygen. It may or may not cause symptoms. Follow these instructions at home: Medicines  Take over-the-counter and prescription medicines only as told by your doctor. This includes iron pills (supplements) and vitamins.  If you cannot handle taking iron pills by mouth, ask your doctor about getting iron through: ? A vein (intravenously). ? A shot (injection) into a muscle.  Take iron pills when your stomach is empty. If you cannot handle this, take them with food.  Do not drink milk or take antacids at the same time as your iron pills.  To prevent trouble pooping (constipation), eat fiber or take medicine (stool softener) as told by your doctor. Eating and drinking   Talk with your doctor before changing the foods you eat. He or she may tell you to eat foods that have a lot of iron, such as: ? Liver. ? Lowfat (lean) beef. ? Breads and cereals that have iron added to them (fortified breads and cereals). ? Eggs. ? Dried fruit. ? Dark green, leafy vegetables.  Drink enough fluid to keep your pee (urine) clear or pale yellow.  Eat fresh fruits and vegetables that are high in vitamin C. They help your body to use iron. Foods with a lot of vitamin C include: ? Oranges. ? Peppers. ? Tomatoes. ? Mangoes. General instructions  Return to your normal activities as told by your doctor. Ask your doctor what activities are safe for you.  Keep yourself clean, and keep things clean around you (your surroundings). Anemia can make you get sick more easily.  Keep all follow-up visits as told by your doctor. This is important. Contact a doctor if:  You feel sick to your stomach (nauseous).  You throw up (vomit).  You feel  weak.  You are sweating for no clear reason.  You have trouble pooping, such as: ? Pooping (having a bowel movement) less than 3 times a week. ? Straining to poop. ? Having poop that is hard, dry, or larger than normal. ? Feeling full or bloated. ? Pain in the lower belly. ? Not feeling better after pooping. Get help right away if:  You pass out (faint). If this happens, do not drive yourself to the hospital. Call your local emergency services (911 in the [place]  You have chest pain.  You have shortness of breath that: ? Is very bad. ? Gets worse with physical activity.  You have a fast heartbeat.  You get light-headed when getting up from sitting or lying down. This information is not intended to replace advice given to you by your health care provider. Make sure you discuss any questions you have with your health care provider. Document Revised: 07/17/2017 Document Reviewed: 04/23/2016 Elsevier Patient Education  [place].

## 2020-07-25 LAB — COMPREHENSIVE METABOLIC PANEL
ALT: 8 IU/L (ref 0–32)
AST: 8 IU/L (ref 0–40)
Albumin/Globulin Ratio: 1.9 (ref 1.2–2.2)
Albumin: 3.8 g/dL (ref 3.7–4.7)
Alkaline Phosphatase: 79 IU/L (ref 44–121)
BUN/Creatinine Ratio: 25 (ref 12–28)
BUN: 46 mg/dL — ABNORMAL HIGH (ref 8–27)
Bilirubin Total: 0.2 mg/dL (ref 0.0–1.2)
CO2: 25 mmol/L (ref 20–29)
Calcium: 8.8 mg/dL (ref 8.7–10.3)
Chloride: 106 mmol/L (ref 96–106)
Creatinine, Ser: 1.85 mg/dL — ABNORMAL HIGH (ref 0.57–1.00)
GFR calc Af Amer: 29 mL/min/{1.73_m2} — ABNORMAL LOW (ref >59)
GFR calc non Af Amer: 26 mL/min/{1.73_m2} — ABNORMAL LOW (ref >59)
Globulin, Total: 2 g/dL (ref 1.5–4.5)
Glucose: 178 mg/dL — ABNORMAL HIGH (ref 65–99)
Potassium: 4.8 mmol/L (ref 3.5–5.2)
Sodium: 143 mmol/L (ref 134–144)
Total Protein: 5.8 g/dL — ABNORMAL LOW (ref 6.0–8.5)

## 2020-07-25 LAB — CBC WITH DIFFERENTIAL/PLATELET
Basophils Absolute: 0.1 10*3/uL (ref 0.0–0.2)
Basos: 1 %
EOS (ABSOLUTE): 0.2 10*3/uL (ref 0.0–0.4)
Eos: 3 %
Hematocrit: 31.9 % — ABNORMAL LOW (ref 34.0–46.6)
Hemoglobin: 10 g/dL — ABNORMAL LOW (ref 11.1–15.9)
Immature Grans (Abs): 0 10*3/uL (ref 0.0–0.1)
Immature Granulocytes: 0 %
Lymphocytes Absolute: 1.5 10*3/uL (ref 0.7–3.1)
Lymphs: 21 %
MCH: 29.5 pg (ref 26.6–33.0)
MCHC: 31.3 g/dL — ABNORMAL LOW (ref 31.5–35.7)
MCV: 94 fL (ref 79–97)
Monocytes Absolute: 0.5 10*3/uL (ref 0.1–0.9)
Monocytes: 7 %
Neutrophils Absolute: 5 10*3/uL (ref 1.4–7.0)
Neutrophils: 68 %
Platelets: 280 10*3/uL (ref 150–450)
RBC: 3.39 x10E6/uL — ABNORMAL LOW (ref 3.77–5.28)
RDW: 13.6 % (ref 11.7–15.4)
WBC: 7.3 10*3/uL (ref 3.4–10.8)

## 2020-07-25 LAB — PROTIME-INR
INR: 1.9 — ABNORMAL HIGH (ref 0.9–1.2)
Prothrombin Time: 19.5 s — ABNORMAL HIGH (ref 9.1–12.0)

## 2020-07-27 ENCOUNTER — Telehealth: Payer: Self-pay | Admitting: Physician Assistant

## 2020-07-27 ENCOUNTER — Telehealth: Payer: Self-pay

## 2020-07-27 NOTE — Telephone Encounter (Signed)
Called patient and no answer, [place]. If patient calls back okay for PEC to advise of message. Patient is already scheduled for a PT/INR recheck on 08/29/2020.

## 2020-07-27 NOTE — Telephone Encounter (Signed)
Patient called back and she was read note by Royetta Crochet Written 07/27/20.  She was made aware that her PT/INR has been scheduled for 08/29/2020 Per chart at 1020 am. Patient verbalized understanding

## 2020-07-27 NOTE — Telephone Encounter (Signed)
-----  Message from Trinna Post, [place] sent at 07/27/2020  9:36 AM EST ----- Can we please call to schedule for PT clinic? Thank you.    Hi Tomasita,   Your hemoglobin is stable. Your kidney function is a little higher than last time but within its usual range. I think decreasing the losartan may be helpful. Your INR is 1.9 which is a little low but only just slightly. I think we can keep your blood thinners the same and get you on the schedule in a month to recheck your INR. I'll have one of my medical assistants reach out to you.   Best, Carles Collet, PA-C

## 2020-08-01 NOTE — Telephone Encounter (Signed)
open encounter in error

## 2020-08-02 ENCOUNTER — Ambulatory Visit: Payer: Medicare Other

## 2020-08-02 DIAGNOSIS — E1122 Type 2 diabetes mellitus with diabetic chronic kidney disease: Secondary | ICD-10-CM

## 2020-08-02 DIAGNOSIS — E1159 Type 2 diabetes mellitus with other circulatory complications: Secondary | ICD-10-CM

## 2020-08-02 DIAGNOSIS — Z794 Long term (current) use of insulin: Secondary | ICD-10-CM

## 2020-08-02 DIAGNOSIS — I152 Hypertension secondary to endocrine disorders: Secondary | ICD-10-CM

## 2020-08-02 NOTE — Chronic Care Management (AMB) (Signed)
Reviewed chart for medication changes ahead of medication coordination call.  07/24/20: Carles Collet, PCP. Ferosul stopped.   BP Readings from Last 3 Encounters:  07/24/20 (!) 112/53  07/10/20 (!) 116/46  07/03/20 130/80    Lab Results  Component Value Date   HGBA1C 7.0 (A) 07/24/2020     Patient obtains medications through Adherence Packaging  30 Days   Patient is due for next adherence delivery on: 08/03/20. Called patient and reviewed medications and coordinated delivery.  This delivery to include: Atorvastatin 80 mg one tablet daily - Bedtime  Losartan 25 mg one tablet daily - Bedtime Januvia 25 mg one tablet daily - Breakfast  Metoprolol Tartrate 25 mg one tablet twice daily - Breakfast, Bedtime  Venlafaxine 75 mg one tablet daily - Breakfast   Toujeo U300 Inject 50 units into the skin daily   Coordinated acute fill to be delivered 08/24/20:  Jardiance 10 mg  Warfarin 4 mg  Warfarin 3 mg  Omeprazole 40 mg  Patient needs refills for Warfarin 3 mg and warfarin 4 mg. Request sent to PCP.   Confirmed delivery date of 08/03/20, advised patient that pharmacy will contact them the morning of delivery.  [place] [phone]

## 2020-08-03 NOTE — Telephone Encounter (Signed)
Patient called office- notified of her lab results and provider comments. Transferred to office for scheduling

## 2020-08-03 NOTE — Telephone Encounter (Signed)
Called patient and no answer, Ann Flynn. If patient calls back okay for PEC to advised and a letter will be mailed out advising patient the office has tried to reach her.

## 2020-08-05 ENCOUNTER — Inpatient Hospital Stay (HOSPITAL_COMMUNITY)
Admission: EM | Admit: 2020-08-05 | Discharge: 2020-08-09 | DRG: 287 | Disposition: A | Discharge status: Home / Self Care | Payer: Medicare Other | Attending: Internal Medicine | Admitting: Internal Medicine

## 2020-08-05 ENCOUNTER — Encounter (HOSPITAL_COMMUNITY): Payer: Self-pay | Admitting: Emergency Medicine

## 2020-08-05 ENCOUNTER — Other Ambulatory Visit: Payer: Self-pay

## 2020-08-05 ENCOUNTER — Emergency Department (HOSPITAL_COMMUNITY): Payer: Medicare Other

## 2020-08-05 DIAGNOSIS — R55 Syncope and collapse: Secondary | ICD-10-CM | POA: Diagnosis present

## 2020-08-05 DIAGNOSIS — R42 Dizziness and giddiness: Secondary | ICD-10-CM

## 2020-08-05 DIAGNOSIS — I2729 Other secondary pulmonary hypertension: Principal | ICD-10-CM | POA: Diagnosis present

## 2020-08-05 DIAGNOSIS — I480 Paroxysmal atrial fibrillation: Secondary | ICD-10-CM | POA: Diagnosis present

## 2020-08-05 DIAGNOSIS — I509 Heart failure, unspecified: Secondary | ICD-10-CM

## 2020-08-05 DIAGNOSIS — R001 Bradycardia, unspecified: Secondary | ICD-10-CM

## 2020-08-05 DIAGNOSIS — Z79899 Other long term (current) drug therapy: Secondary | ICD-10-CM

## 2020-08-05 DIAGNOSIS — Z7722 Contact with and (suspected) exposure to environmental tobacco smoke (acute) (chronic): Secondary | ICD-10-CM | POA: Diagnosis present

## 2020-08-05 DIAGNOSIS — Z882 Allergy status to sulfonamides status: Secondary | ICD-10-CM

## 2020-08-05 DIAGNOSIS — I130 Hypertensive heart and chronic kidney disease with heart failure and stage 1 through stage 4 chronic kidney disease, or unspecified chronic kidney disease: Secondary | ICD-10-CM | POA: Diagnosis present

## 2020-08-05 DIAGNOSIS — Z794 Long term (current) use of insulin: Secondary | ICD-10-CM

## 2020-08-05 DIAGNOSIS — I482 Chronic atrial fibrillation, unspecified: Secondary | ICD-10-CM | POA: Diagnosis not present

## 2020-08-05 DIAGNOSIS — E662 Morbid (severe) obesity with alveolar hypoventilation: Secondary | ICD-10-CM | POA: Diagnosis present

## 2020-08-05 DIAGNOSIS — I159 Secondary hypertension, unspecified: Secondary | ICD-10-CM

## 2020-08-05 DIAGNOSIS — I4892 Unspecified atrial flutter: Secondary | ICD-10-CM | POA: Diagnosis present

## 2020-08-05 DIAGNOSIS — E11649 Type 2 diabetes mellitus with hypoglycemia without coma: Secondary | ICD-10-CM | POA: Diagnosis not present

## 2020-08-05 DIAGNOSIS — Z20822 Contact with and (suspected) exposure to covid-19: Secondary | ICD-10-CM | POA: Diagnosis present

## 2020-08-05 DIAGNOSIS — I5032 Chronic diastolic (congestive) heart failure: Secondary | ICD-10-CM | POA: Diagnosis present

## 2020-08-05 DIAGNOSIS — N1832 Chronic kidney disease, stage 3b: Secondary | ICD-10-CM | POA: Diagnosis present

## 2020-08-05 DIAGNOSIS — Z880 Allergy status to penicillin: Secondary | ICD-10-CM

## 2020-08-05 DIAGNOSIS — I358 Other nonrheumatic aortic valve disorders: Secondary | ICD-10-CM | POA: Diagnosis present

## 2020-08-05 DIAGNOSIS — E119 Type 2 diabetes mellitus without complications: Secondary | ICD-10-CM | POA: Diagnosis present

## 2020-08-05 DIAGNOSIS — I272 Pulmonary hypertension, unspecified: Secondary | ICD-10-CM

## 2020-08-05 DIAGNOSIS — Z86711 Personal history of pulmonary embolism: Secondary | ICD-10-CM

## 2020-08-05 DIAGNOSIS — N184 Chronic kidney disease, stage 4 (severe): Secondary | ICD-10-CM | POA: Diagnosis present

## 2020-08-05 DIAGNOSIS — Z8673 Personal history of transient ischemic attack (TIA), and cerebral infarction without residual deficits: Secondary | ICD-10-CM

## 2020-08-05 DIAGNOSIS — I371 Nonrheumatic pulmonary valve insufficiency: Secondary | ICD-10-CM | POA: Diagnosis present

## 2020-08-05 DIAGNOSIS — E1122 Type 2 diabetes mellitus with diabetic chronic kidney disease: Secondary | ICD-10-CM | POA: Diagnosis present

## 2020-08-05 DIAGNOSIS — R21 Rash and other nonspecific skin eruption: Secondary | ICD-10-CM | POA: Diagnosis present

## 2020-08-05 DIAGNOSIS — Z7984 Long term (current) use of oral hypoglycemic drugs: Secondary | ICD-10-CM

## 2020-08-05 DIAGNOSIS — I2510 Atherosclerotic heart disease of native coronary artery without angina pectoris: Secondary | ICD-10-CM | POA: Diagnosis present

## 2020-08-05 DIAGNOSIS — Z7901 Long term (current) use of anticoagulants: Secondary | ICD-10-CM

## 2020-08-05 DIAGNOSIS — Z6841 Body Mass Index (BMI) 40.0 and over, adult: Secondary | ICD-10-CM

## 2020-08-05 DIAGNOSIS — Z8249 Family history of ischemic heart disease and other diseases of the circulatory system: Secondary | ICD-10-CM

## 2020-08-05 DIAGNOSIS — Z9981 Dependence on supplemental oxygen: Secondary | ICD-10-CM

## 2020-08-05 DIAGNOSIS — J9611 Chronic respiratory failure with hypoxia: Secondary | ICD-10-CM | POA: Diagnosis present

## 2020-08-05 LAB — URINALYSIS, ROUTINE W REFLEX MICROSCOPIC
Bilirubin Urine: NEGATIVE
Glucose, UA: 150 mg/dL — AB
Ketones, ur: NEGATIVE mg/dL
Nitrite: POSITIVE — AB
Protein, ur: NEGATIVE mg/dL
Specific Gravity, Urine: 1.008 (ref 1.005–1.030)
pH: 5 (ref 5.0–8.0)

## 2020-08-05 LAB — CBC
HCT: 33.4 % — ABNORMAL LOW (ref 36.0–46.0)
Hemoglobin: 9.7 g/dL — ABNORMAL LOW (ref 12.0–15.0)
MCH: 29.1 pg (ref 26.0–34.0)
MCHC: 29 g/dL — ABNORMAL LOW (ref 30.0–36.0)
MCV: 100.3 fL — ABNORMAL HIGH (ref 80.0–100.0)
Platelets: 233 10*3/uL (ref 150–400)
RBC: 3.33 MIL/uL — ABNORMAL LOW (ref 3.87–5.11)
RDW: 14.9 % (ref 11.5–15.5)
WBC: 6.3 10*3/uL (ref 4.0–10.5)
nRBC: 0 % (ref 0.0–0.2)

## 2020-08-05 LAB — BASIC METABOLIC PANEL
Anion gap: 10 (ref 5–15)
BUN: 43 mg/dL — ABNORMAL HIGH (ref 8–23)
CO2: 26 mmol/L (ref 22–32)
Calcium: 9 mg/dL (ref 8.9–10.3)
Chloride: 105 mmol/L (ref 98–111)
Creatinine, Ser: 1.77 mg/dL — ABNORMAL HIGH (ref 0.44–1.00)
GFR, Estimated: 29 mL/min — ABNORMAL LOW (ref >60)
Glucose, Bld: 146 mg/dL — ABNORMAL HIGH (ref 70–99)
Potassium: 4.8 mmol/L (ref 3.5–5.1)
Sodium: 141 mmol/L (ref 135–145)

## 2020-08-05 LAB — PROTIME-INR
INR: 3.1 — ABNORMAL HIGH (ref 0.8–1.2)
Prothrombin Time: 31 seconds — ABNORMAL HIGH (ref 11.4–15.2)

## 2020-08-05 LAB — RESP PANEL BY RT-PCR (FLU A&B, COVID) ARPGX2
Influenza A by PCR: NEGATIVE
Influenza B by PCR: NEGATIVE
SARS Coronavirus 2 by RT PCR: NEGATIVE

## 2020-08-05 MED ORDER — LINAGLIPTIN 5 MG PO TABS
5.0000 mg | ORAL_TABLET | Freq: Every day | ORAL | Status: DC
  Administered 2020-08-06 – 2020-08-09 (×4): 5 mg via ORAL
  Filled 2020-08-05 (×4): qty 1

## 2020-08-05 MED ORDER — ALBUTEROL SULFATE (2.5 MG/3ML) 0.083% IN NEBU: 2.5000 mg | INHALATION_SOLUTION | RESPIRATORY_TRACT | Status: DC | PRN

## 2020-08-05 MED ORDER — DOCUSATE SODIUM 100 MG PO CAPS: 100.0000 mg | ORAL_CAPSULE | Freq: Every day | ORAL | Status: DC | PRN

## 2020-08-05 MED ORDER — SODIUM CHLORIDE 0.9 % IV BOLUS
500.0000 mL | Freq: Once | INTRAVENOUS | Status: AC
  Administered 2020-08-05: 15:00:00 500 mL via INTRAVENOUS

## 2020-08-05 MED ORDER — LOSARTAN POTASSIUM 25 MG PO TABS
25.0000 mg | ORAL_TABLET | Freq: Every day | ORAL | Status: DC
  Administered 2020-08-06 – 2020-08-09 (×4): 25 mg via ORAL
  Filled 2020-08-05 (×4): qty 1

## 2020-08-05 MED ORDER — TADALAFIL 20 MG PO TABS
40.0000 mg | ORAL_TABLET | Freq: Every day | ORAL | Status: DC
  Administered 2020-08-06 – 2020-08-08 (×4): 40 mg via ORAL
  Filled 2020-08-05 (×5): qty 2

## 2020-08-05 MED ORDER — INSULIN GLARGINE 100 UNIT/ML ~~LOC~~ SOLN
40.0000 [IU] | Freq: Every day | SUBCUTANEOUS | Status: DC
  Administered 2020-08-06 (×2): 40 [IU] via SUBCUTANEOUS
  Filled 2020-08-05 (×3): qty 0.4

## 2020-08-05 MED ORDER — VITAMIN D 25 MCG (1000 UNIT) PO TABS
2000.0000 [IU] | ORAL_TABLET | Freq: Every day | ORAL | Status: DC
  Administered 2020-08-06 – 2020-08-09 (×4): 2000 [IU] via ORAL
  Filled 2020-08-05 (×4): qty 2

## 2020-08-05 MED ORDER — TORSEMIDE 20 MG PO TABS
20.0000 mg | ORAL_TABLET | Freq: Every day | ORAL | Status: DC
  Administered 2020-08-06 – 2020-08-09 (×4): 20 mg via ORAL
  Filled 2020-08-05 (×4): qty 1

## 2020-08-05 MED ORDER — AMBRISENTAN 5 MG PO TABS
5.0000 mg | ORAL_TABLET | Freq: Every day | ORAL | Status: DC
  Administered 2020-08-06 – 2020-08-08 (×4): 5 mg via ORAL
  Filled 2020-08-05 (×6): qty 1

## 2020-08-05 MED ORDER — ACETAMINOPHEN 325 MG PO TABS
650.0000 mg | ORAL_TABLET | Freq: Four times a day (QID) | ORAL | Status: DC | PRN
  Administered 2020-08-05 – 2020-08-09 (×9): 650 mg via ORAL
  Filled 2020-08-05 (×10): qty 2

## 2020-08-05 MED ORDER — FERROUS SULFATE 325 (65 FE) MG PO TABS
325.0000 mg | ORAL_TABLET | Freq: Three times a day (TID) | ORAL | Status: DC
  Administered 2020-08-06 – 2020-08-09 (×9): 325 mg via ORAL
  Filled 2020-08-05 (×9): qty 1

## 2020-08-05 MED ORDER — DIPHENHYDRAMINE HCL 25 MG PO CAPS: 25.0000 mg | ORAL_CAPSULE | Freq: Four times a day (QID) | ORAL | Status: DC | PRN

## 2020-08-05 MED ORDER — HYDRALAZINE HCL 20 MG/ML IJ SOLN
10.0000 mg | Freq: Four times a day (QID) | INTRAMUSCULAR | Status: DC | PRN
  Filled 2020-08-05: qty 1

## 2020-08-05 MED ORDER — ATORVASTATIN CALCIUM 80 MG PO TABS
80.0000 mg | ORAL_TABLET | Freq: Every day | ORAL | Status: DC
  Administered 2020-08-05 – 2020-08-08 (×4): 80 mg via ORAL
  Filled 2020-08-05: qty 8
  Filled 2020-08-05 (×4): qty 1

## 2020-08-05 MED ORDER — HYDRALAZINE HCL 25 MG PO TABS
25.0000 mg | ORAL_TABLET | Freq: Two times a day (BID) | ORAL | Status: DC
  Administered 2020-08-05 – 2020-08-09 (×8): 25 mg via ORAL
  Filled 2020-08-05 (×8): qty 1

## 2020-08-05 MED ORDER — VITAMIN B-12 1000 MCG PO TABS
1000.0000 ug | ORAL_TABLET | Freq: Every day | ORAL | Status: DC
  Administered 2020-08-06 – 2020-08-09 (×4): 1000 ug via ORAL
  Filled 2020-08-05 (×4): qty 1

## 2020-08-05 MED ORDER — PANTOPRAZOLE SODIUM 40 MG PO TBEC
40.0000 mg | DELAYED_RELEASE_TABLET | Freq: Every day | ORAL | Status: DC
  Administered 2020-08-06 – 2020-08-09 (×4): 40 mg via ORAL
  Filled 2020-08-05 (×4): qty 1

## 2020-08-05 MED ORDER — METHYLPREDNISOLONE SODIUM SUCC 40 MG IJ SOLR
40.0000 mg | Freq: Once | INTRAMUSCULAR | Status: AC
  Administered 2020-08-05: 22:00:00 40 mg via INTRAVENOUS
  Filled 2020-08-05: qty 1

## 2020-08-05 MED ORDER — CIPROFLOXACIN IN D5W 400 MG/200ML IV SOLN
400.0000 mg | Freq: Once | INTRAVENOUS | Status: AC
  Administered 2020-08-05: 18:00:00 400 mg via INTRAVENOUS
  Filled 2020-08-05: qty 200

## 2020-08-05 MED ORDER — ACETAMINOPHEN 650 MG RE SUPP: 650.0000 mg | Freq: Four times a day (QID) | RECTAL | Status: DC | PRN

## 2020-08-05 NOTE — ED Notes (Signed)
Half way through cipro infusion pts left arm below IV site began to itch and pt had very mild hives. No shortness of breath or chest pain. MD aware verbal order to stop IV infusion of Cipro.

## 2020-08-05 NOTE — ED Notes (Addendum)
Pt alert and oriented at this time. No complaints. Per pt, witnessed syncopal episode by friend states she did hit head. Takes coumadin.  Noted bradycardia-- EKG complete

## 2020-08-05 NOTE — H&P (Addendum)
History and Physical    DOA: 08/05/2020  PCP: Trinna Post, PA-C  Patient coming from: Home  Chief Complaint: Syncope  HPI: Ann Flynn is a 79 y.o. female with history h/o hypertension, chronic atrial fibrillation s/p recent cardioversion in November, CHF, CKD, diabetes mellitus, pulmonary hypertension who is on 3 L nasal cannula at home presents after a syncopal episode.  Patient states she went to the church today and decreased O2 to 2 L while inside the church "to avoid beeping" but did increase it back to 3 L while she was walking back towards her car.  In the parking lot, she felt somewhat lightheaded followed by a syncopal event and hit her head.  She states she has had similar dizzy spells at least 4 times in the last few months-all the episodes happened while she was ambulating.  Denies any dizziness with positional changes.  This is however the first episode since her cardioversion.  She denies any complaints of dyspnea or chest pain or palpitations.  Her heart rate has been in the 40s to low 50s in the ED which she believes is on the low side for her but unable to tell me her baseline heart rate.   ED work-up has been essentially unremarkable with normal WBC, renal function at baseline and electrolytes within normal limits.  CT head unremarkable.  She is saturating 100% on 3 L O2.  UA showed signs of UTI with leukocyte esterase/nitrite positive but patient denies any dysuria.  She was started on IV Cipro by EDP.  During my interview she reported hives along left arm and forearm which she believes is related to contact with a woolen blanket which is on the stretcher.  She does not have any itching or hives anywhere else on her body.  She is requested to be admitted for further evaluation of syncope and bradycardia/elevated blood pressure (systolic 128N to 867E in the ED)   Review of Systems: As per HPI otherwise 10 point review of systems negative.    Past Medical History:   Diagnosis Date  . A-fib ([place])   . Anemia   . CHF (congestive heart failure) ([place])   . Chronic kidney disease   . Diabetes mellitus without complication ([place])   . Hypertension   . Pulmonary hypertension ([place])     Past Surgical History:  Procedure Laterality Date  . ABDOMINAL HYSTERECTOMY  1997  . ABDOMINAL HYSTERECTOMY    . CARDIOVERSION N/A 07/10/2020   Procedure: CARDIOVERSION;  Surgeon: Jolaine Artist, MD;  Location: [facility] ENDOSCOPY;  Service: Cardiovascular;  Laterality: N/A;  . cartiod artery surgery  2010   per patient   . CHOLECYSTECTOMY  10/19/2009  . masectomy    . MASTECTOMY  1994  . RIGHT HEART CATH N/A 07/29/2019   Procedure: RIGHT HEART CATH;  Surgeon: Jolaine Artist, MD;  Location: [place] CV LAB;  Service: Cardiovascular;  Laterality: N/A;  . RIGHT HEART CATH N/A 05/11/2020   Procedure: RIGHT HEART CATH;  Surgeon: Jolaine Artist, MD;  Location: [place] [facility] LAB;  Service: Cardiovascular;  Laterality: N/A;    Social history:  reports that she has never smoked. She has never used smokeless tobacco. She reports that she does not drink alcohol and does not use drugs.   Allergies  Allergen Reactions  . Penicillins Swelling    Facial swelling Did it involve swelling of the face/tongue/throat, SOB, or low BP? Yes Did it involve sudden or severe rash/hives, skin peeling, or  any reaction on the inside of your mouth or nose? No Did you need to seek medical attention at a hospital or doctor's office? No When did it last happen?10 + years If all above answers are "NO", may proceed with cephalosporin use.   . Sulfa Antibiotics Rash    Family History  Problem Relation Age of Onset  . CAD Mother   . Heart disease Father   . CAD Maternal Grandmother       Prior to Admission medications   Medication Sig Start Date End Date Taking? Authorizing Provider  acetaminophen (TYLENOL) 650 MG CR tablet Take 650-1,300 mg by mouth every 8 (eight) hours  as needed for pain.   Yes [provider]  atorvastatin (LIPITOR) 80 MG tablet TAKE 1 TABLET BY MOUTH DAILY Patient taking differently: Take 80 mg by mouth at bedtime. 12/28/19  Yes Trinna Post, PA-C  BD PEN NEEDLE NANO U/F 32G X 4 MM MISC USE TO INJECT ONCE DAILY AS DIRECTED 11/30/19  Yes Trinna Post, PA-C  Cholecalciferol (VITAMIN D3) 50 MCG (2000 UT) TABS Take 2,000 Units by mouth in the morning and at bedtime.   Yes [provider]  clindamycin (CLEOCIN) 300 MG capsule Take 300 mg by mouth 3 (three) times daily. Started on 07-30-20 for 7 days. On day 7 of therapy. 07/30/20  Yes [provider]  docusate sodium (COLACE) 100 MG capsule Take 100 mg by mouth daily as needed (constipation.).    Yes [provider]  empagliflozin (JARDIANCE) 10 MG TABS tablet Take 1 tablet (10 mg total) by mouth daily before breakfast. 07/03/20  Yes Bensimhon, Shaune Pascal, MD  FEROSUL 325 (65 Fe) MG tablet TAKE 1 TABLET BY MOUTH 3 TIMES DAILY WITH MEALS Patient taking differently: Take 325 mg by mouth in the morning, at noon, and at bedtime. 06/22/20  Yes [place]ak, Adriana M, PA-C  JANUVIA 25 MG tablet TAKE ONE TABLET BY MOUTH EVERY DAY Patient taking differently: Take 25 mg by mouth daily. 04/18/20  Yes Carles Collet M, PA-C  losartan (COZAAR) 25 MG tablet Take 1 tablet (25 mg total) by mouth daily. 07/24/20  Yes Carles Collet M, PA-C  metoprolol tartrate (LOPRESSOR) 25 MG tablet TAKE 1 TABLET BY MOUTH TWICE DAILY Patient taking differently: Take 25 mg by mouth 2 (two) times daily. 05/04/20  Yes Carles Collet M, PA-C  omeprazole (PRILOSEC) 40 MG capsule TAKE 1 CAPSULE BY MOUTH ONCE DAILY Patient taking differently: Take 40 mg by mouth at bedtime. 05/04/20  Yes Trinna Post, PA-C  ONETOUCH ULTRA test strip USE AS DIRECTED 05/04/20  Yes Trinna Post, PA-C  tadalafil, PAH, (ADCIRCA) 20 MG tablet Take 2 tablets (40 mg total) by mouth at bedtime. 04/17/20  Yes Flora Lipps, MD  torsemide (DEMADEX) 20 MG tablet Take 1 tablet (20 mg total) by mouth daily. 05/03/20  Yes Bensimhon, Shaune Pascal, MD  TOUJEO SOLOSTAR 300 UNIT/ML Solostar Pen INJECT 50 UNITS INTO THE SKIN DAILY Patient taking differently: Inject 50 Units into the skin at bedtime. 04/20/20  Yes Trinna Post, PA-C  venlafaxine (EFFEXOR) 75 MG tablet Take 75 mg by mouth daily.    Yes [provider]  vitamin B-12 (CYANOCOBALAMIN) 1000 MCG tablet Take 1,000 mcg by mouth in the morning and at bedtime.   Yes [provider]  warfarin (COUMADIN) 3 MG tablet Take 3 mg on Wed and Saturday Patient taking differently: Take 3 mg by mouth 2 (two) times a week. Take  1 tablet (3 mg) by mouth on Wednesdays & Saturdays in the evening. 04/19/20  Yes Trinna Post, PA-C  ambrisentan (LETAIRIS) 5 MG tablet Take 5 mg by mouth at bedtime. 03/07/20   [provider]  warfarin (COUMADIN) 4 MG tablet Take 4 mg on Sun, Mon, Tues, Thurs, Friday. Patient taking differently: Take 4 mg by mouth See admin instructions. Take 1 tablet (4 mg) by mouth on Sundays, Mondays, Tuesdays, Thursdays & Fridays in the evening. 04/19/20   Trinna Post, PA-C    Physical Exam: Vitals:   08/05/20 1600 08/05/20 1715 08/05/20 1730 08/05/20 1852  BP: (!) 179/66 (!) 195/62 (!) 191/67 (!) 163/52  Pulse: (!) 45 (!) 55 (!) 51 (!) 44  Resp: 18 15 (!) 21 17  Temp:      TempSrc:      SpO2: 100% 100% 98% 100%    Constitutional: NAD, calm, comfortable Eyes: PERRL, lids and conjunctivae normal ENMT: Mucous membranes are moist. Posterior pharynx clear of any exudate or lesions.Normal dentition.  Neck: normal, supple, no masses, no thyromegaly Respiratory: clear to auscultation bilaterally, no wheezing, no crackles. Normal respiratory effort. No accessory muscle use.  Cardiovascular: Regular rate and rhythm, no murmurs / rubs / gallops. No extremity edema. 2+ pedal pulses. No carotid bruits.  Abdomen: no tenderness, no  masses palpated. No hepatosplenomegaly. Bowel sounds positive.  Musculoskeletal: no clubbing / cyanosis. No joint deformity upper and lower extremities. Good ROM, no contractures. Normal muscle tone.  Neurologic: CN 2-12 grossly intact. Sensation intact, DTR normal. Strength 5/5 in all 4.  Psychiatric: Normal judgment and insight. Alert and oriented x 3. Normal mood.  SKIN/catheters: Hives noted along left forearm and lower third of the arm.   Labs on Admission: I have personally reviewed following labs and imaging studies  CBC: Recent Labs  Lab 08/05/20 1305  WBC 6.3  HGB 9.7*  HCT 33.4*  MCV 100.3*  PLT 003   Basic Metabolic Panel: Recent Labs  Lab 08/05/20 1305  NA 141  K 4.8  CL 105  CO2 26  GLUCOSE 146*  BUN 43*  CREATININE 1.77*  CALCIUM 9.0   GFR: Estimated Creatinine Clearance: 32.3 mL/min (A) (by C-G formula based on SCr of 1.77 mg/dL (H)). Recent Labs  Lab 08/05/20 1305  WBC 6.3   Liver Function Tests: No results for input(s): AST, ALT, ALKPHOS, BILITOT, PROT, ALBUMIN in the last 168 hours. No results for input(s): LIPASE, AMYLASE in the last 168 hours. No results for input(s): AMMONIA in the last 168 hours. Coagulation Profile: No results for input(s): INR, PROTIME in the last 168 hours. Cardiac Enzymes: No results for input(s): CKTOTAL, CKMB, CKMBINDEX, TROPONINI in the last 168 hours. BNP (last 3 results) No results for input(s): PROBNP in the last 8760 hours. HbA1C: No results for input(s): HGBA1C in the last 72 hours. CBG: No results for input(s): GLUCAP in the last 168 hours. Lipid Profile: No results for input(s): CHOL, HDL, LDLCALC, TRIG, CHOLHDL, LDLDIRECT in the last 72 hours. Thyroid Function Tests: No results for input(s): TSH, T4TOTAL, FREET4, T3FREE, THYROIDAB in the last 72 hours. Anemia Panel: No results for input(s): VITAMINB12, FOLATE, FERRITIN, TIBC, IRON, RETICCTPCT in the last 72 hours. Urine analysis:    Component Value  Date/Time   COLORURINE YELLOW 08/05/2020 1718   APPEARANCEUR CLEAR 08/05/2020 1718   LABSPEC 1.008 08/05/2020 1718   PHURINE 5.0 08/05/2020 1718   GLUCOSEU 150 (A) 08/05/2020 1718   HGBUR SMALL (A) 08/05/2020 1718  BILIRUBINUR NEGATIVE 08/05/2020 [place] 08/05/2020 1718   PROTEINUR NEGATIVE 08/05/2020 1718   NITRITE POSITIVE (A) 08/05/2020 1718   LEUKOCYTESUR TRACE (A) 08/05/2020 1718    Radiological Exams on Admission: Personally reviewed  CT HEAD WO CONTRAST  Result Date: 08/05/2020 CLINICAL DATA:  Head trauma. EXAM: CT HEAD WITHOUT CONTRAST TECHNIQUE: Contiguous axial images were obtained from the base of the skull through the vertex without intravenous contrast. COMPARISON:  Brain CT 04/09/2020. FINDINGS: Brain: Ventricles and sulci are appropriate for patient's age. No evidence for acute cortically based infarct, intracranial hemorrhage, mass lesion or mass-effect. Vascular: Unremarkable. Skull: Intact. Sinuses/Orbits: Paranasal sinuses well aerated. Mastoid air cells are unremarkable. Other: None. IMPRESSION: No acute intracranial process. Electronically Signed   By: Lovey Newcomer M.D.   On: 08/05/2020 13:38    EKG: Independently reviewed.  Sinus bradycardia with heart rate at 46, RAD, QTC 452 ms     Assessment and Plan:   Active Problems:   Syncope and collapse    1.  Dizzy spells, syncope: Could be related to bradycardia.  Since patient in sinus rhythm and s/p recent cardioversion, will hold beta-blockers and monitor response.  Can consider cardiology evaluation in a.m. if bradycardia does not improve (patient follows Dr. [name]).  Check TSH.  Check orthostatics.  Control blood pressure.  CT head unremarkable.  2.  Atrial fibrillation: S/p recent cardioversion.  Hold beta-blockers as discussed above.  Consider discontinuation of anticoagulants after discussion with cardiology given recurrent dizzy spells/falls.  She did hit her head this time and is on  Coumadin but fortunately CT head negative for ICH.  Check PT/INR (last level was 1.9 on 12/7)  3.  Uncontrolled hypertension: Patient states usually her blood pressure is well controlled.  Resume home medications-ACE inhibitor's, diuretics, hold beta-blockers.  Add hydralazine and monitor response.  Will hold off on Norvasc until orthostasis ruled out.  4.  CKD stage IIIb: Baseline creatinine appears to be around 1.8.  Currently renal function at baseline.  Resume home medications  5. Chronic normocytic anemia: Baseline hemoglobin around 10, slightly low at 9.7 currently.  Monitor for now.  Likely related to CKD.  6.  Pulmonary hypertension with chronic respiratory failure: Resume home O2 3 L.  Patient states she only turndown her oxygen to 2 L but never completely went off O2.  Doubt if syncope related to hypoxia as she did turn back O2 to 3 L while backing back from the church.  Resume home medications.  7.  Chronic diastolic CHF: Appears to be compensated, resume home diuretics.  8.  Diabetes mellitus: Resume home insulin regimen.  Diabetic diet.  9.  Left upper extremity rash: Appears to be localized and could be related to contact dermatitis with the woolen blanket.  Will give 1 dose of IV steroids.  Benadryl as needed for itching available.  DC Cipro as no complaints of dysuria although doubt drug allergy.   DVT prophylaxis: On Coumadin  COVID screen: Negative  Code Status: Full code as confirmed with patient.Health care proxy would be her daughter  Patient/Family Communication: Discussed with patient and all questions answered to satisfaction.  Consults called: None Admission status :Patient will be admitted under OBSERVATION status.The patient's presenting symptoms, physical exam findings, and initial radiographic and laboratory data in the context of their medical condition is felt to place them at low risk for further clinical deterioration. Furthermore, it is anticipated that the  patient will be medically stable for discharge from the  hospital within 2 midnights of hospital stay.      Guilford Shi MD [facility] Pager in [place]  If 7PM-7AM, please contact night-coverage [email or web address]   08/05/2020, 6:56 PM

## 2020-08-05 NOTE — ED Notes (Signed)
RN messaged pharmacy for verification of due medications

## 2020-08-05 NOTE — ED Triage Notes (Signed)
[place] to triage via [facility] from [place].  She was at the back of her car and had a syncopal episode hitting back of head.  Pt takes Coumadin.  Denies head, neck, and back pain.  Normally wears 3 liters O2 at home and states she normally turns it down while at church so it doesn't beep.  Denies any complaints at present. CBG 224.

## 2020-08-05 NOTE — ED Provider Notes (Signed)
[facility] EMERGENCY DEPARTMENT Provider Note   CSN: [id number] Arrival date & time: 08/05/20  1159     History Chief Complaint  Patient presents with  . syncopal episode    Ann Flynn is a 79 y.o. female.  Patient with history of pulmonary hypertension, diabetes, CHF, atrial fibrillation on Coumadin here with syncopal episode.  States she was getting in her car after church and fell to the ground hitting her head.  She remembers feeling dizzy lightheaded and needing to sit down.  She describes this lightheaded sensation but denies room spinning.  She apparently hit the back of her head and lost consciousness.  She denies any headache, neck or back pain at this time.  No chest pain or shortness of breath.  Her family reported that she turned off her oxygen during church but feels like her breathing is normal at this time.  She denies any back pain, abdominal pain, nausea or vomiting.  States she ate and drink normally this morning did not feel like her sugar was low.  Recent decrease of her losartan dose by her cardiologist.  She denies any complaint of pain from the fall.  States she has no headache.  States she has no pain in her legs or hips  The history is provided by the patient. The history is limited by the condition of the patient.       Past Medical History:  Diagnosis Date  . A-fib ([place])   . Anemia   . CHF (congestive heart failure) ([place])   . Chronic kidney disease   . Diabetes mellitus without complication ([place])   . Hypertension   . Pulmonary hypertension [facility])     Patient Active Problem List   Diagnosis Date Noted  . Atrial fibrillation, chronic ([place]) 03/24/2020  . Type 2 diabetes mellitus with stage 4 chronic kidney disease ([place]) 03/24/2020  . Syncope 03/24/2020  . Pulmonary hypertension ([place]) 03/24/2020  . Subdural hematoma, acute ([place]) 03/24/2020  . Pulmonary nodules 01/30/2020  . Chronic pulmonary embolism, unspecified pulmonary  embolism type, unspecified whether acute cor pulmonale present ([place]) 10/06/2019  . Chronic respiratory failure with hypoxia ([place]) 10/06/2019  . Morbid (severe) obesity due to excess calories ([place]) 10/06/2019  . Hypertension associated with diabetes ([place]) 10/06/2019  . Hyperlipidemia associated with type 2 diabetes mellitus ([place]) 10/06/2019  . CKD (chronic kidney disease), stage IV ([place]) 04/01/2019  . Allergic rhinitis 03/28/2019  . Arthritis 03/28/2019  . CHF (congestive heart failure) ([place]) 03/28/2019  . Diabetes mellitus without complication ([place]) 13/24/4010  . Sleep apnea 03/28/2019  . Pulmonary hypertension, primary ([place]) 03/28/2019    Past Surgical History:  Procedure Laterality Date  . ABDOMINAL HYSTERECTOMY  1997  . ABDOMINAL HYSTERECTOMY    . CARDIOVERSION N/A 07/10/2020   Procedure: CARDIOVERSION;  Surgeon: Jolaine Artist, MD;  Location: [facility] ENDOSCOPY;  Service: Cardiovascular;  Laterality: N/A;  . cartiod artery surgery  2010   per patient   . CHOLECYSTECTOMY  10/19/2009  . masectomy    . MASTECTOMY  1994  . RIGHT HEART CATH N/A 07/29/2019   Procedure: RIGHT HEART CATH;  Surgeon: Jolaine Artist, MD;  Location: [facility] CV LAB;  Service: Cardiovascular;  Laterality: N/A;  . RIGHT HEART CATH N/A 05/11/2020   Procedure: RIGHT HEART CATH;  Surgeon: Jolaine Artist, MD;  Location: [facility] CV LAB;  Service: Cardiovascular;  Laterality: N/A;     OB History   No obstetric history on file.  Family History  Problem Relation Age of Onset  . CAD Mother   . Heart disease Father   . CAD Maternal Grandmother     Social History   Tobacco Use  . Smoking status: Never Smoker  . Smokeless tobacco: Never Used  Vaping Use  . Vaping Use: Never used  Substance Use Topics  . Alcohol use: Never  . Drug use: Never    Home Medications Prior to Admission medications   Medication Sig Start Date End Date Taking? Authorizing Provider  acetaminophen (TYLENOL)  650 MG CR tablet Take 650-1,300 mg by mouth every 8 (eight) hours as needed for pain.    [provider]  ambrisentan (LETAIRIS) 5 MG tablet Take 5 mg by mouth at bedtime. 03/07/20   [provider]  atorvastatin (LIPITOR) 80 MG tablet TAKE 1 TABLET BY MOUTH DAILY Patient taking differently: Take 80 mg by mouth at bedtime.  12/28/19   Trinna Post, PA-C  BD PEN NEEDLE NANO U/F 32G X 4 MM MISC USE TO INJECT ONCE DAILY AS DIRECTED 11/30/19   Trinna Post, PA-C  Cholecalciferol (VITAMIN D3) 50 MCG (2000 UT) TABS Take 2,000 Units by mouth in the morning and at bedtime.    [provider]  docusate sodium (COLACE) 100 MG capsule Take 100 mg by mouth daily as needed (constipation.).     [provider]  empagliflozin (JARDIANCE) 10 MG TABS tablet Take 1 tablet (10 mg total) by mouth daily before breakfast. 07/03/20   Bensimhon, Shaune Pascal, MD  FEROSUL 325 (65 Fe) MG tablet TAKE 1 TABLET BY MOUTH 3 TIMES DAILY WITH MEALS Patient taking differently: Take 325 mg by mouth in the morning, at noon, and at bedtime.  06/22/20   Pollak, Fabio Bering M, PA-C  JANUVIA 25 MG tablet TAKE ONE TABLET BY MOUTH EVERY DAY Patient taking differently: Take 25 mg by mouth daily.  04/18/20   Trinna Post, PA-C  losartan (COZAAR) 25 MG tablet Take 1 tablet (25 mg total) by mouth daily. 07/24/20   Trinna Post, PA-C  metoprolol tartrate (LOPRESSOR) 25 MG tablet TAKE 1 TABLET BY MOUTH TWICE DAILY Patient taking differently: Take 25 mg by mouth 2 (two) times daily.  05/04/20   Trinna Post, PA-C  omeprazole (PRILOSEC) 40 MG capsule TAKE 1 CAPSULE BY MOUTH ONCE DAILY Patient taking differently: Take 40 mg by mouth at bedtime.  05/04/20   Trinna Post, PA-C  ONETOUCH ULTRA test strip USE AS DIRECTED 05/04/20   Trinna Post, PA-C  tadalafil, PAH, (ADCIRCA) 20 MG tablet Take 2 tablets (40 mg total) by mouth at bedtime. 04/17/20   Flora Lipps, MD  torsemide (DEMADEX) 20 MG tablet  Take 1 tablet (20 mg total) by mouth daily. 05/03/20   Bensimhon, Shaune Pascal, MD  TOUJEO SOLOSTAR 300 UNIT/ML Solostar Pen INJECT 50 UNITS INTO THE SKIN DAILY Patient taking differently: Inject 50 Units into the skin at bedtime.  04/20/20   Trinna Post, PA-C  venlafaxine (EFFEXOR) 75 MG tablet Take 75 mg by mouth daily.     [provider]  vitamin B-12 (CYANOCOBALAMIN) 1000 MCG tablet Take 1,000 mcg by mouth in the morning and at bedtime.    [provider]  warfarin (COUMADIN) 3 MG tablet Take 3 mg on Wed and Saturday Patient taking differently: Take 3 mg by mouth 2 (two) times a week. Take 1 tablet (3 mg) by mouth on Wednesdays & Saturdays in the evening. 04/19/20  Carles Collet M, PA-C  warfarin (COUMADIN) 4 MG tablet Take 4 mg on Sun, Mon, Tues, Thurs, Friday. Patient taking differently: Take 4 mg by mouth See admin instructions. Take 1 tablet (4 mg) by mouth on Sundays, Mondays, Tuesdays, Thursdays & Fridays in the evening. 04/19/20   Trinna Post, PA-C    Allergies    Penicillins and Sulfa antibiotics  Review of Systems   Review of Systems  Constitutional: Positive for fatigue. Negative for activity change, appetite change and fever.  HENT: Negative for congestion and rhinorrhea.   Respiratory: Negative for cough and shortness of breath.   Cardiovascular: Negative for chest pain.  Gastrointestinal: Negative for nausea and vomiting.  Genitourinary: Negative for dysuria and hematuria.  Musculoskeletal: Negative for arthralgias, back pain and myalgias.  Skin: Negative for rash.  Neurological: Positive for syncope. Negative for weakness and headaches.   all other systems are negative except as noted in the HPI and PMH.   Physical Exam Updated Vital Signs BP (!) 183/61   Pulse (!) 44   Temp 98 F (36.7 C) (Oral)   Resp 17   SpO2 100%   Physical Exam Vitals and nursing note reviewed.  Constitutional:      General: She is not in acute distress.     Appearance: She is well-developed and well-nourished.     Comments: Small hematoma to occiput  HENT:     Head: Normocephalic and atraumatic.     Mouth/Throat:     Mouth: Oropharynx is clear and moist.     Pharynx: No oropharyngeal exudate.  Eyes:     Extraocular Movements: EOM normal.     Conjunctiva/sclera: Conjunctivae normal.     Pupils: Pupils are equal, round, and reactive to light.  Neck:     Comments: No C spine tenderness Cardiovascular:     Rate and Rhythm: Regular rhythm. Bradycardia present.     Pulses: Intact distal pulses.     Heart sounds: Normal heart sounds. No murmur heard.     Comments: Bradycardia 40s Pulmonary:     Effort: Pulmonary effort is normal. No respiratory distress.     Breath sounds: Normal breath sounds.  Chest:     Chest wall: No tenderness.  Abdominal:     Palpations: Abdomen is soft.     Tenderness: There is no abdominal tenderness. There is no guarding or rebound.  Musculoskeletal:        General: No tenderness or edema. Normal range of motion.     Cervical back: Normal range of motion and neck supple.     Comments: No T or L spine tenderness.  FROM hips without pain.  Skin:    General: Skin is warm.  Neurological:     Mental Status: She is alert and oriented to person, place, and time.     Cranial Nerves: No cranial nerve deficit.     Motor: No abnormal muscle tone.     Coordination: Coordination normal.     Comments:  5/5 strength throughout. CN 2-12 intact.Equal grip strength.   Psychiatric:        Mood and Affect: Mood and affect normal.        Behavior: Behavior normal.     ED Results / Procedures / Treatments   Labs (all labs ordered are listed, but only abnormal results are displayed) Labs Reviewed  BASIC METABOLIC PANEL - Abnormal; Notable for the following components:      Result Value   Glucose, Bld 146 (*)  BUN 43 (*)    Creatinine, Ser 1.77 (*)    GFR, Estimated 29 (*)    All other components within normal limits   CBC - Abnormal; Notable for the following components:   RBC 3.33 (*)    Hemoglobin 9.7 (*)    HCT 33.4 (*)    MCV 100.3 (*)    MCHC 29.0 (*)    All other components within normal limits  URINALYSIS, ROUTINE W REFLEX MICROSCOPIC - Abnormal; Notable for the following components:   Glucose, UA 150 (*)    Hgb urine dipstick SMALL (*)    Nitrite POSITIVE (*)    Leukocytes,Ua TRACE (*)    Bacteria, UA FEW (*)    All other components within normal limits  RESP PANEL BY RT-PCR (FLU A&B, COVID) [id number]  BASIC METABOLIC PANEL    EKG EKG Interpretation  Date/Time:  Sunday August 05 2020 14:35:43 EST Ventricular Rate:  46 PR Interval:    QRS Duration: 98 QT Interval:  516 QTC Calculation: 452 R Axis:   91 Text Interpretation: Sinus or ectopic atrial bradycardia Atrial premature complex Short PR interval Right axis deviation Borderline T abnormalities, anterior leads No significant change was found Confirmed by Ezequiel Essex [phone]) on 08/05/2020 2:48:33 PM   Radiology CT HEAD WO CONTRAST  Result Date: 08/05/2020 CLINICAL DATA:  Head trauma. EXAM: CT HEAD WITHOUT CONTRAST TECHNIQUE: Contiguous axial images were obtained from the base of the skull through the vertex without intravenous contrast. COMPARISON:  Brain CT 04/09/2020. FINDINGS: Brain: Ventricles and sulci are appropriate for patient's age. No evidence for acute cortically based infarct, intracranial hemorrhage, mass lesion or mass-effect. Vascular: Unremarkable. Skull: Intact. Sinuses/Orbits: Paranasal sinuses well aerated. Mastoid air cells are unremarkable. Other: None. IMPRESSION: No acute intracranial process. Electronically Signed   By: Lovey Newcomer M.D.   On: 08/05/2020 13:38    Procedures Procedures (including critical care time)  Medications Ordered in ED Medications  sodium chloride 0.9 % bolus 500 mL (has no administration in time range)    ED Course  I have reviewed the triage vital signs and the nursing  notes.  Pertinent labs & imaging results that were available during my care of the patient were reviewed by me and considered in my medical decision making (see chart for details).    MDM Rules/Calculators/A&P                         Head injury with syncope.  Patient does take Coumadin.  Neurologically intact.  CT scan in triage is negative for hemorrhage.  EKG shows sinus bradycardia in the 40s which is a change from her previous EKG.  She denies feeling dizzy or lightheaded with this time  Heart rate remained sinus rhythm in the 40s and 50s.  Patient is asymptomatic from this.  She is able to ambulate without dizziness or lightheadedness.  Her heart rate appears to be decreased from her baseline in the 60s.  She does take a beta-blocker but is not recently changed.  Question whether her syncope could related to her bradycardia.  She also did not have her oxygen time and hypoxia may been playing a role.  She is asymptomatic now.  She denies any chest pain or shortness of breath. Her CT head is negative.  She is agreeable to observation admission overnight to monitor her heart rates. UTI will be treated. Admission d/w Dr. Earnest Conroy.  Final Clinical Impression(s) / ED Diagnoses Final diagnoses:  Syncope, unspecified syncope type  Bradycardia    Rx / DC Orders ED Discharge Orders    None       Aleeta Schmaltz, Annie Main, MD 08/05/20 1756

## 2020-08-06 ENCOUNTER — Encounter (HOSPITAL_COMMUNITY): Payer: Self-pay | Admitting: Internal Medicine

## 2020-08-06 ENCOUNTER — Observation Stay (HOSPITAL_COMMUNITY): Payer: Medicare Other

## 2020-08-06 DIAGNOSIS — I272 Pulmonary hypertension, unspecified: Secondary | ICD-10-CM

## 2020-08-06 DIAGNOSIS — I340 Nonrheumatic mitral (valve) insufficiency: Secondary | ICD-10-CM | POA: Diagnosis not present

## 2020-08-06 DIAGNOSIS — R55 Syncope and collapse: Secondary | ICD-10-CM | POA: Diagnosis not present

## 2020-08-06 LAB — BASIC METABOLIC PANEL
Anion gap: 10 (ref 5–15)
BUN: 41 mg/dL — ABNORMAL HIGH (ref 8–23)
CO2: 25 mmol/L (ref 22–32)
Calcium: 9 mg/dL (ref 8.9–10.3)
Chloride: 106 mmol/L (ref 98–111)
Creatinine, Ser: 1.66 mg/dL — ABNORMAL HIGH (ref 0.44–1.00)
GFR, Estimated: 31 mL/min — ABNORMAL LOW (ref >60)
Glucose, Bld: 170 mg/dL — ABNORMAL HIGH (ref 70–99)
Potassium: 4.5 mmol/L (ref 3.5–5.1)
Sodium: 141 mmol/L (ref 135–145)

## 2020-08-06 LAB — ECHOCARDIOGRAM COMPLETE
AR max vel: 1.53 cm2
AV Area VTI: 1.59 cm2
AV Area mean vel: 1.52 cm2
AV Mean grad: 10 mmHg
AV Peak grad: 17.3 mmHg
Ao pk vel: 2.08 m/s
Area-P 1/2: 1.82 cm2
Height: 65 in
S' Lateral: 3.6 cm
Weight: 3957.7 oz

## 2020-08-06 LAB — PROTIME-INR
INR: 3 — ABNORMAL HIGH (ref 0.8–1.2)
Prothrombin Time: 29.8 seconds — ABNORMAL HIGH (ref 11.4–15.2)

## 2020-08-06 LAB — GLUCOSE, CAPILLARY
Glucose-Capillary: 183 mg/dL — ABNORMAL HIGH (ref 70–99)
Glucose-Capillary: 127 mg/dL — ABNORMAL HIGH (ref 70–99)

## 2020-08-06 MED ORDER — INSULIN ASPART 100 UNIT/ML ~~LOC~~ SOLN: 0.0000 [IU] | Freq: Every day | SUBCUTANEOUS | Status: DC

## 2020-08-06 MED ORDER — INSULIN ASPART 100 UNIT/ML ~~LOC~~ SOLN
0.0000 [IU] | Freq: Three times a day (TID) | SUBCUTANEOUS | Status: DC
  Administered 2020-08-06: 17:00:00 2 [IU] via SUBCUTANEOUS
  Administered 2020-08-08 (×2): 1 [IU] via SUBCUTANEOUS
  Administered 2020-08-09: 07:00:00 2 [IU] via SUBCUTANEOUS
  Administered 2020-08-09: 12:00:00 3 [IU] via SUBCUTANEOUS

## 2020-08-06 NOTE — H&P (View-Only) (Signed)
Cardiology Consultation:   Patient ID: Ann Flynn MRN: [id number]; DOB: 1940-11-26  Admit date: 08/05/2020 Date of Consult: 08/06/2020  Primary Care Provider: Paulene Floor [facility] Cardiologist: [facility] Electrophysiologist:  None   Patient Profile:   Ann Flynn is a 79 y.o. female with a hx of  hypertension, chronic atrial fibrillation, CKD, diabetes, pulmonary hypertension, chronic hypoxic respiratory failure/obesity hypoventilation, obstructive sleep apnea   who is being seen today for the evaluation of syncope at the request of Dr. Sloan Leiter.  History of Present Illness:   Ann Flynn is a 79 year old female with the above-noted medical history.  She is had episodes of syncope in the past.    She has a hx of severe pulmonary HTN and is on Tadalafil ( CIalis 40 mg Q HS )  and Ambrisentan ( Letairis 5 mg Q HS)..  She had run out of the Ambrisentan ( she thinks) on Thursday night and did not take any further meds.    On Sunday she was going back to her car to open her trunk to store her wheelchair.  She felt up, immediately felt dizzy and then passed out.  EMS was called.  Her heart rate was 40 at the time..  Echocardiogram from March 24, 2020 reveals normal left ventricular systolic function with an ejection fraction of 55 to 60%.  She has moderate LVH.  We were not able to evaluate her diastolic filling. Right ventricle was normal size but there was moderate pulmonary hypertension with an estimated PA pressure of 40.5.  She had moderate RV dysfunction.   Past Medical History:  Diagnosis Date  . A-fib ([place])   . Anemia   . CHF (congestive heart failure) ([place])   . Chronic kidney disease   . Diabetes mellitus without complication ([place])   . Hypertension   . Pulmonary hypertension ([place])   . Syncope 07/2020    Past Surgical History:  Procedure Laterality Date  . ABDOMINAL HYSTERECTOMY  1997  . ABDOMINAL HYSTERECTOMY    . CARDIOVERSION N/A  07/10/2020   Procedure: CARDIOVERSION;  Surgeon: Jolaine Artist, MD;  Location: [facility] ENDOSCOPY;  Service: Cardiovascular;  Laterality: N/A;  . cartiod artery surgery  2010   per patient   . CHOLECYSTECTOMY  10/19/2009  . masectomy    . MASTECTOMY  1994  . RIGHT HEART CATH N/A 07/29/2019   Procedure: RIGHT HEART CATH;  Surgeon: Jolaine Artist, MD;  Location: [place] CV LAB;  Service: Cardiovascular;  Laterality: N/A;  . RIGHT HEART CATH N/A 05/11/2020   Procedure: RIGHT HEART CATH;  Surgeon: Jolaine Artist, MD;  Location: [place] CV LAB;  Service: Cardiovascular;  Laterality: N/A;     Home Medications:  Prior to Admission medications   Medication Sig Start Date End Date Taking? Authorizing Provider  acetaminophen (TYLENOL) 650 MG CR tablet Take 650-1,300 mg by mouth every 8 (eight) hours as needed for pain.   Yes [provider]  atorvastatin (LIPITOR) 80 MG tablet TAKE 1 TABLET BY MOUTH DAILY Patient taking differently: Take 80 mg by mouth at bedtime. 12/28/19  Yes Trinna Post, PA-C  BD PEN NEEDLE NANO U/F 32G X 4 MM MISC USE TO INJECT ONCE DAILY AS DIRECTED 11/30/19  Yes Trinna Post, PA-C  Cholecalciferol (VITAMIN D3) 50 MCG (2000 UT) TABS Take 2,000 Units by mouth in the morning and at bedtime.   Yes [provider]  clindamycin (CLEOCIN) 300 MG capsule Take 300 mg  by mouth 3 (three) times daily. Started on 07-30-20 for 7 days. On day 7 of therapy. 07/30/20  Yes [provider]  docusate sodium (COLACE) 100 MG capsule Take 100 mg by mouth daily as needed (constipation.).    Yes [provider]  empagliflozin (JARDIANCE) 10 MG TABS tablet Take 1 tablet (10 mg total) by mouth daily before breakfast. 07/03/20  Yes Bensimhon, Shaune Pascal, MD  FEROSUL 325 (65 Fe) MG tablet TAKE 1 TABLET BY MOUTH 3 TIMES DAILY WITH MEALS Patient taking differently: Take 325 mg by mouth in the morning, at noon, and at bedtime. 06/22/20  Yes Pollak,  Adriana M, PA-C  JANUVIA 25 MG tablet TAKE ONE TABLET BY MOUTH EVERY DAY Patient taking differently: Take 25 mg by mouth daily. 04/18/20  Yes Carles Collet M, PA-C  losartan (COZAAR) 25 MG tablet Take 1 tablet (25 mg total) by mouth daily. 07/24/20  Yes Carles Collet M, PA-C  metoprolol tartrate (LOPRESSOR) 25 MG tablet TAKE 1 TABLET BY MOUTH TWICE DAILY Patient taking differently: Take 25 mg by mouth 2 (two) times daily. 05/04/20  Yes Carles Collet M, PA-C  omeprazole (PRILOSEC) 40 MG capsule TAKE 1 CAPSULE BY MOUTH ONCE DAILY Patient taking differently: Take 40 mg by mouth at bedtime. 05/04/20  Yes Trinna Post, PA-C  ONETOUCH ULTRA test strip USE AS DIRECTED 05/04/20  Yes Trinna Post, PA-C  tadalafil, PAH, (ADCIRCA) 20 MG tablet Take 2 tablets (40 mg total) by mouth at bedtime. 04/17/20  Yes Flora Lipps, MD  torsemide (DEMADEX) 20 MG tablet Take 1 tablet (20 mg total) by mouth daily. 05/03/20  Yes Bensimhon, Shaune Pascal, MD  TOUJEO SOLOSTAR 300 UNIT/ML Solostar Pen INJECT 50 UNITS INTO THE SKIN DAILY Patient taking differently: Inject 50 Units into the skin at bedtime. 04/20/20  Yes Trinna Post, PA-C  venlafaxine (EFFEXOR) 75 MG tablet Take 75 mg by mouth daily.    Yes [provider]  vitamin B-12 (CYANOCOBALAMIN) 1000 MCG tablet Take 1,000 mcg by mouth in the morning and at bedtime.   Yes [provider]  warfarin (COUMADIN) 3 MG tablet Take 3 mg on Wed and Saturday Patient taking differently: Take 3 mg by mouth 2 (two) times a week. Take 1 tablet (3 mg) by mouth on Wednesdays & Saturdays in the evening. 04/19/20  Yes Trinna Post, PA-C  ambrisentan (LETAIRIS) 5 MG tablet Take 5 mg by mouth at bedtime. 03/07/20   [provider]  warfarin (COUMADIN) 4 MG tablet Take 4 mg on Sun, Mon, Tues, Thurs, Friday. Patient taking differently: Take 4 mg by mouth See admin instructions. Take 1 tablet (4 mg) by mouth on Sundays, Mondays, Tuesdays, Thursdays & Fridays  in the evening. 04/19/20   Trinna Post, PA-C    Inpatient Medications: Scheduled Meds: . ambrisentan  5 mg Oral QHS  . atorvastatin  80 mg Oral QHS  . cholecalciferol  2,000 Units Oral Daily  . ferrous sulfate  325 mg Oral TID WC  . hydrALAZINE  25 mg Oral BID  . insulin aspart  0-5 Units Subcutaneous QHS  . insulin aspart  0-9 Units Subcutaneous TID WC  . insulin glargine  40 Units Subcutaneous QHS  . linagliptin  5 mg Oral Daily  . losartan  25 mg Oral Daily  . pantoprazole  40 mg Oral Daily  . tadalafil  40 mg Oral QHS  . torsemide  20 mg Oral Daily  . vitamin B-12  1,000 mcg  Oral Daily   Continuous Infusions:  PRN Meds: acetaminophen **OR** acetaminophen, albuterol, diphenhydrAMINE, docusate sodium, hydrALAZINE  Allergies:    Allergies  Allergen Reactions  . Penicillins Swelling    Facial swelling Did it involve swelling of the face/tongue/throat, SOB, or low BP? Yes Did it involve sudden or severe rash/hives, skin peeling, or any reaction on the inside of your mouth or nose? No Did you need to seek medical attention at a hospital or doctor's office? No When did it last happen?10 + years If all above answers are "NO", may proceed with cephalosporin use.   . Sulfa Antibiotics Rash    Social History:   Social History   Socioeconomic History  . Marital status: Widowed    Spouse name: Not on file  . Number of children: Not on file  . Years of education: Not on file  . Highest education level: Not on file  Occupational History  . Not on file  Tobacco Use  . Smoking status: Never Smoker  . Smokeless tobacco: Never Used  Vaping Use  . Vaping Use: Never used  Substance and Sexual Activity  . Alcohol use: Never  . Drug use: Never  . Sexual activity: Not on file  Other Topics Concern  . Not on file  Social History Narrative   ** Merged History Encounter **       Social Determinants of Health   Financial Resource Strain: Low Risk   . Difficulty  of Paying Living Expenses: Not hard at all  Food Insecurity: Not on file  Transportation Needs: No Transportation Needs  . Lack of Transportation (Medical): No  . Lack of Transportation (Non-Medical): No  Physical Activity: Not on file  Stress: Not on file  Social Connections: Not on file  Intimate Partner Violence: Not on file    Family History:    Family History  Problem Relation Age of Onset  . CAD Mother   . Heart disease Father   . CAD Maternal Grandmother      ROS:  Please see the history of present illness.   All other ROS reviewed and negative.     Physical Exam/Data:   Vitals:   08/06/20 1345 08/06/20 1400 08/06/20 1413 08/06/20 1413  BP:   (!) 153/55 (!) 153/55  Pulse: 79  77 77  Resp: _0 Temp:  98.4 F (36.9 C) 98 F (36.7 C) 98 F (36.7 C)  TempSrc:  Oral Oral Oral  SpO2: 96%  100% 100%  Weight:   112.2 kg   Height:   5' 5" (1.651 m)     Intake/Output Summary (Last 24 hours) at 08/06/2020 1551 Last data filed at 08/05/2020 1908 Gross per 24 hour  Intake 600 ml  Output --  Net 600 ml   Last 3 Weights 08/06/2020 07/24/2020 07/10/2020  Weight (lbs) 247 lb 5.7 oz 249 lb 3.2 oz 241 lb  Weight (kg) 112.2 kg 113.036 kg 109.317 kg     Body mass index is 41.16 kg/m.  General: Elderly, obese female, she is out of breath lying in bed. HEENT: normal Lymph: no adenopathy Neck: no JVD Endocrine:  No thryomegaly Vascular: No carotid bruits; FA pulses 2+ bilaterally without bruits  Cardiac:  normal S1, S2; RRR; soft systolic murmur Lungs:  clear to auscultation bilaterally, no wheezing, rhonchi or rales  Abd: soft, nontender, no hepatomegaly  Ext: no edema Musculoskeletal:  No deformities, BUE and BLE strength normal and equal Skin: warm and dry  Neuro:  CNs 2-12 intact, no focal abnormalities noted Psych:  Normal affect   EKG:  The EKG was personally reviewed and demonstrates: Sinus rhythm with no significant pauses Telemetry:  Telemetry was  personally reviewed and demonstrates: Sinus rhythm with no significant pauses  Relevant CV Studies:   Laboratory Data:  High Sensitivity Troponin:  No results for input(s): TROPONINIHS in the last 720 hours.   Chemistry Recent Labs  Lab 08/05/20 1305 08/06/20 0315  NA 141 141  K 4.8 4.5  CL 105 106  CO2 26 25  GLUCOSE 146* 170*  BUN 43* 41*  CREATININE 1.77* 1.66*  CALCIUM 9.0 9.0  GFRNONAA 29* 31*  ANIONGAP 10 10    No results for input(s): PROT, ALBUMIN, AST, ALT, ALKPHOS, BILITOT in the last 168 hours. Hematology Recent Labs  Lab 08/05/20 1305  WBC 6.3  RBC 3.33*  HGB 9.7*  HCT 33.4*  MCV 100.3*  MCH 29.1  MCHC 29.0*  RDW 14.9  PLT 233   BNPNo results for input(s): BNP, PROBNP in the last 168 hours.  DDimer No results for input(s): DDIMER in the last 168 hours.   Radiology/Studies:  CT HEAD WO CONTRAST  Result Date: 08/05/2020 CLINICAL DATA:  Head trauma. EXAM: CT HEAD WITHOUT CONTRAST TECHNIQUE: Contiguous axial images were obtained from the base of the skull through the vertex without intravenous contrast. COMPARISON:  Brain CT 04/09/2020. FINDINGS: Brain: Ventricles and sulci are appropriate for patient's age. No evidence for acute cortically based infarct, intracranial hemorrhage, mass lesion or mass-effect. Vascular: Unremarkable. Skull: Intact. Sinuses/Orbits: Paranasal sinuses well aerated. Mastoid air cells are unremarkable. Other: None. IMPRESSION: No acute intracranial process. Electronically Signed   By: Lovey Newcomer M.D.   On: 08/05/2020 13:38     Assessment and Plan:   1. Syncope: She had run out of one of her pulmonary hypertension medicine several days prior to the episode of syncope.  She has been having more and more episodes of near syncope with exertion.  She has severe respiratory failure with pulmonary hypertension and I suspect her near syncope/syncopal episodes are due to right heart failure.  I discussed the case with Dr. [name].  We  will repeat the echocardiogram. He will perform a right heart catheterization tomorrow and make further adjustments.  Continue current medications for now.         For questions or updates, please contact [place] Please consult [email or web address] for contact info under    Signed, Mertie Moores, MD  08/06/2020 3:51 PM

## 2020-08-06 NOTE — Consult Note (Signed)
Cardiology Consultation:   Patient ID: Ann Flynn MRN: [id number]; DOB: 09/10/1940  Admit date: 08/05/2020 Date of Consult: 08/06/2020  Primary Care Provider: Pollak, Adriana M, PA-C [facility] Cardiologist: Bensimhon  [facility] Electrophysiologist:  None   Patient Profile:   Ann Flynn is a 79 y.o. female with a hx of  hypertension, chronic atrial fibrillation, CKD, diabetes, pulmonary hypertension, chronic hypoxic respiratory failure/obesity hypoventilation, obstructive sleep apnea   who is being seen today for the evaluation of syncope at the request of Dr. Ghimire.  History of Present Illness:   Ann Flynn is a 79-year-old female with the above-noted medical history.  She is had episodes of syncope in the past.    She has a hx of severe pulmonary HTN and is on Tadalafil ( CIalis 40 mg Q HS )  and Ambrisentan ( Letairis 5 mg Q HS)..  She had run out of the Ambrisentan ( she thinks) on Thursday night and did not take any further meds.    On Sunday she was going back to her car to open her trunk to store her wheelchair.  She felt up, immediately felt dizzy and then passed out.  EMS was called.  Her heart rate was 40 at the time..  Echocardiogram from March 24, 2020 reveals normal left ventricular systolic function with an ejection fraction of 55 to 60%.  She has moderate LVH.  We were not able to evaluate her diastolic filling. Right ventricle was normal size but there was moderate pulmonary hypertension with an estimated PA pressure of 40.5.  She had moderate RV dysfunction.   Past Medical History:  Diagnosis Date  . A-fib ([facility])   . Anemia   . CHF (congestive heart failure) ([facility])   . Chronic kidney disease   . Diabetes mellitus without complication ([facility])   . Hypertension   . Pulmonary hypertension ([facility])   . Syncope 07/2020    Past Surgical History:  Procedure Laterality Date  . ABDOMINAL HYSTERECTOMY  1997  . ABDOMINAL HYSTERECTOMY    . CARDIOVERSION N/A  07/10/2020   Procedure: CARDIOVERSION;  Surgeon: Bensimhon, Daniel R, MD;  Location: [facility] ENDOSCOPY;  Service: Cardiovascular;  Laterality: N/A;  . cartiod artery surgery  2010   per patient   . CHOLECYSTECTOMY  10/19/2009  . masectomy    . MASTECTOMY  1994  . RIGHT HEART CATH N/A 07/29/2019   Procedure: RIGHT HEART CATH;  Surgeon: Bensimhon, Daniel R, MD;  Location: [facility] INVASIVE CV LAB;  Service: Cardiovascular;  Laterality: N/A;  . RIGHT HEART CATH N/A 05/11/2020   Procedure: RIGHT HEART CATH;  Surgeon: Bensimhon, Daniel R, MD;  Location: [facility] INVASIVE CV LAB;  Service: Cardiovascular;  Laterality: N/A;     Home Medications:  Prior to Admission medications   Medication Sig Start Date End Date Taking? Authorizing Provider  acetaminophen (TYLENOL) 650 MG CR tablet Take 650-1,300 mg by mouth every 8 (eight) hours as needed for pain.   Yes [provider]  atorvastatin (LIPITOR) 80 MG tablet TAKE 1 TABLET BY MOUTH DAILY Patient taking differently: Take 80 mg by mouth at bedtime. 12/28/19  Yes Pollak, Adriana M, PA-C  BD PEN NEEDLE NANO U/F 32G X 4 MM MISC USE TO INJECT ONCE DAILY AS DIRECTED 11/30/19  Yes Pollak, Adriana M, PA-C  Cholecalciferol (VITAMIN D3) 50 MCG (2000 UT) TABS Take 2,000 Units by mouth in the morning and at bedtime.   Yes [provider]  clindamycin (CLEOCIN) 300 MG capsule Take 300 mg   by mouth 3 (three) times daily. Started on 07-30-20 for 7 days. On day 7 of therapy. 07/30/20  Yes [provider]  docusate sodium (COLACE) 100 MG capsule Take 100 mg by mouth daily as needed (constipation.).    Yes [provider]  empagliflozin (JARDIANCE) 10 MG TABS tablet Take 1 tablet (10 mg total) by mouth daily before breakfast. 07/03/20  Yes Bensimhon, Daniel R, MD  FEROSUL 325 (65 Fe) MG tablet TAKE 1 TABLET BY MOUTH 3 TIMES DAILY WITH MEALS Patient taking differently: Take 325 mg by mouth in the morning, at noon, and at bedtime. 06/22/20  Yes Pollak,  Adriana M, PA-C  JANUVIA 25 MG tablet TAKE ONE TABLET BY MOUTH EVERY DAY Patient taking differently: Take 25 mg by mouth daily. 04/18/20  Yes Pollak, Adriana M, PA-C  losartan (COZAAR) 25 MG tablet Take 1 tablet (25 mg total) by mouth daily. 07/24/20  Yes Pollak, Adriana M, PA-C  metoprolol tartrate (LOPRESSOR) 25 MG tablet TAKE 1 TABLET BY MOUTH TWICE DAILY Patient taking differently: Take 25 mg by mouth 2 (two) times daily. 05/04/20  Yes Pollak, Adriana M, PA-C  omeprazole (PRILOSEC) 40 MG capsule TAKE 1 CAPSULE BY MOUTH ONCE DAILY Patient taking differently: Take 40 mg by mouth at bedtime. 05/04/20  Yes Pollak, Adriana M, PA-C  ONETOUCH ULTRA test strip USE AS DIRECTED 05/04/20  Yes Pollak, Adriana M, PA-C  tadalafil, PAH, (ADCIRCA) 20 MG tablet Take 2 tablets (40 mg total) by mouth at bedtime. 04/17/20  Yes Kasa, Kurian, MD  torsemide (DEMADEX) 20 MG tablet Take 1 tablet (20 mg total) by mouth daily. 05/03/20  Yes Bensimhon, Daniel R, MD  TOUJEO SOLOSTAR 300 UNIT/ML Solostar Pen INJECT 50 UNITS INTO THE SKIN DAILY Patient taking differently: Inject 50 Units into the skin at bedtime. 04/20/20  Yes Pollak, Adriana M, PA-C  venlafaxine (EFFEXOR) 75 MG tablet Take 75 mg by mouth daily.    Yes [provider]  vitamin B-12 (CYANOCOBALAMIN) 1000 MCG tablet Take 1,000 mcg by mouth in the morning and at bedtime.   Yes [provider]  warfarin (COUMADIN) 3 MG tablet Take 3 mg on Wed and Saturday Patient taking differently: Take 3 mg by mouth 2 (two) times a week. Take 1 tablet (3 mg) by mouth on Wednesdays & Saturdays in the evening. 04/19/20  Yes Pollak, Adriana M, PA-C  ambrisentan (LETAIRIS) 5 MG tablet Take 5 mg by mouth at bedtime. 03/07/20   [provider]  warfarin (COUMADIN) 4 MG tablet Take 4 mg on Sun, Mon, Tues, Thurs, Friday. Patient taking differently: Take 4 mg by mouth See admin instructions. Take 1 tablet (4 mg) by mouth on Sundays, Mondays, Tuesdays, Thursdays & Fridays  in the evening. 04/19/20   Pollak, Adriana M, PA-C    Inpatient Medications: Scheduled Meds: . ambrisentan  5 mg Oral QHS  . atorvastatin  80 mg Oral QHS  . cholecalciferol  2,000 Units Oral Daily  . ferrous sulfate  325 mg Oral TID WC  . hydrALAZINE  25 mg Oral BID  . insulin aspart  0-5 Units Subcutaneous QHS  . insulin aspart  0-9 Units Subcutaneous TID WC  . insulin glargine  40 Units Subcutaneous QHS  . linagliptin  5 mg Oral Daily  . losartan  25 mg Oral Daily  . pantoprazole  40 mg Oral Daily  . tadalafil  40 mg Oral QHS  . torsemide  20 mg Oral Daily  . vitamin B-12  1,000 mcg   Oral Daily   Continuous Infusions:  PRN Meds: acetaminophen **OR** acetaminophen, albuterol, diphenhydrAMINE, docusate sodium, hydrALAZINE  Allergies:    Allergies  Allergen Reactions  . Penicillins Swelling    Facial swelling Did it involve swelling of the face/tongue/throat, SOB, or low BP? Yes Did it involve sudden or severe rash/hives, skin peeling, or any reaction on the inside of your mouth or nose? No Did you need to seek medical attention at a hospital or doctor's office? No When did it last happen?10 + years If all above answers are "NO", may proceed with cephalosporin use.   . Sulfa Antibiotics Rash    Social History:   Social History   Socioeconomic History  . Marital status: Widowed    Spouse name: Not on file  . Number of children: Not on file  . Years of education: Not on file  . Highest education level: Not on file  Occupational History  . Not on file  Tobacco Use  . Smoking status: Never Smoker  . Smokeless tobacco: Never Used  Vaping Use  . Vaping Use: Never used  Substance and Sexual Activity  . Alcohol use: Never  . Drug use: Never  . Sexual activity: Not on file  Other Topics Concern  . Not on file  Social History Narrative   ** Merged History Encounter **       Social Determinants of Health   Financial Resource Strain: Low Risk   . Difficulty  of Paying Living Expenses: Not hard at all  Food Insecurity: Not on file  Transportation Needs: No Transportation Needs  . Lack of Transportation (Medical): No  . Lack of Transportation (Non-Medical): No  Physical Activity: Not on file  Stress: Not on file  Social Connections: Not on file  Intimate Partner Violence: Not on file    Family History:    Family History  Problem Relation Age of Onset  . CAD Mother   . Heart disease Father   . CAD Maternal Grandmother      ROS:  Please see the history of present illness.   All other ROS reviewed and negative.     Physical Exam/Data:   Vitals:   08/06/20 1345 08/06/20 1400 08/06/20 1413 08/06/20 1413  BP:   (!) 153/55 (!) 153/55  Pulse: 79  77 77  Resp: _0 Temp:  98.4 F (36.9 C) 98 F (36.7 C) 98 F (36.7 C)  TempSrc:  Oral Oral Oral  SpO2: 96%  100% 100%  Weight:   112.2 kg   Height:   5' 5" (1.651 m)     Intake/Output Summary (Last 24 hours) at 08/06/2020 1551 Last data filed at 08/05/2020 1908 Gross per 24 hour  Intake 600 ml  Output --  Net 600 ml   Last 3 Weights 08/06/2020 07/24/2020 07/10/2020  Weight (lbs) 247 lb 5.7 oz 249 lb 3.2 oz 241 lb  Weight (kg) 112.2 kg 113.036 kg 109.317 kg     Body mass index is 41.16 kg/m.  General: Elderly, obese female, she is out of breath lying in bed. HEENT: normal Lymph: no adenopathy Neck: no JVD Endocrine:  No thryomegaly Vascular: No carotid bruits; FA pulses 2+ bilaterally without bruits  Cardiac:  normal S1, S2; RRR; soft systolic murmur Lungs:  clear to auscultation bilaterally, no wheezing, rhonchi or rales  Abd: soft, nontender, no hepatomegaly  Ext: no edema Musculoskeletal:  No deformities, BUE and BLE strength normal and equal Skin: warm and dry  Neuro:  CNs 2-12 intact, no focal abnormalities noted Psych:  Normal affect   EKG:  The EKG was personally reviewed and demonstrates: Sinus rhythm with no significant pauses Telemetry:  Telemetry was  personally reviewed and demonstrates: Sinus rhythm with no significant pauses  Relevant CV Studies:   Laboratory Data:  High Sensitivity Troponin:  No results for input(s): TROPONINIHS in the last 720 hours.   Chemistry Recent Labs  Lab 08/05/20 1305 08/06/20 0315  NA 141 141  K 4.8 4.5  CL 105 106  CO2 26 25  GLUCOSE 146* 170*  BUN 43* 41*  CREATININE 1.77* 1.66*  CALCIUM 9.0 9.0  GFRNONAA 29* 31*  ANIONGAP 10 10    No results for input(s): PROT, ALBUMIN, AST, ALT, ALKPHOS, BILITOT in the last 168 hours. Hematology Recent Labs  Lab 08/05/20 1305  WBC 6.3  RBC 3.33*  HGB 9.7*  HCT 33.4*  MCV 100.3*  MCH 29.1  MCHC 29.0*  RDW 14.9  PLT 233   BNPNo results for input(s): BNP, PROBNP in the last 168 hours.  DDimer No results for input(s): DDIMER in the last 168 hours.   Radiology/Studies:  CT HEAD WO CONTRAST  Result Date: 08/05/2020 CLINICAL DATA:  Head trauma. EXAM: CT HEAD WITHOUT CONTRAST TECHNIQUE: Contiguous axial images were obtained from the base of the skull through the vertex without intravenous contrast. COMPARISON:  Brain CT 04/09/2020. FINDINGS: Brain: Ventricles and sulci are appropriate for patient's age. No evidence for acute cortically based infarct, intracranial hemorrhage, mass lesion or mass-effect. Vascular: Unremarkable. Skull: Intact. Sinuses/Orbits: Paranasal sinuses well aerated. Mastoid air cells are unremarkable. Other: None. IMPRESSION: No acute intracranial process. Electronically Signed   By: Lovey Newcomer M.D.   On: 08/05/2020 13:38     Assessment and Plan:   1. Syncope: She had run out of one of her pulmonary hypertension medicine several days prior to the episode of syncope.  She has been having more and more episodes of near syncope with exertion.  She has severe respiratory failure with pulmonary hypertension and I suspect her near syncope/syncopal episodes are due to right heart failure.  I discussed the case with Dr. [name].  We  will repeat the echocardiogram. He will perform a right heart catheterization tomorrow and make further adjustments.  Continue current medications for now.         For questions or updates, please contact [place] Please consult [email or web address] for contact info under    Signed, Mertie Moores, MD  08/06/2020 3:51 PM

## 2020-08-06 NOTE — Progress Notes (Signed)
PROGRESS NOTE    Ann Flynn  YBF:[id number] DOB: August 30, 1940 DOA: 08/05/2020 PCP: Trinna Post, PA-C    Brief Narrative:  79 year old female with history of hypertension, chronic A. fib status post recent cardioversion, CKD stage IIIb, diabetes, pulmonary hypertension and chronic hypoxic respiratory failure on 3 L of oxygen at home, sleep apnea on CPAP at night presents to the hospital with syncopal episode.  Apparently she had these episodes at least for this year.  Yesterday, she was in the church, went back to her car and opened her trunk to keep her wheelchair, felt dizzy and immediately passed out.  Heart rate reported by EMS was 40.  Most of the events have been happening on upright position.  Does feel dizzy before falling but very fast.   Assessment & Plan:   Principal Problem:   Syncope and collapse Active Problems:   CHF (congestive heart failure) (HCC)   Atrial fibrillation, chronic (HCC)  Syncope and collapse: In a patient with multiple medical issues, A. fib, chronic hypoxia. Exact cause unknown.  Unlikely CNS event.  CT scan head was normal. May need ambulatory monitoring, will consult cardiology.  Discontinue beta-blockers. Check orthostatic vitals, discussed with patient about orthostatic precautions. Mobilize.  Atrial fibrillation: Paroxysmal.  Recent cardioversion.  Currently with sinus bradycardia.  Therapeutic on Coumadin.  Hypertension, uncontrolled: Blood pressures fairly stable after resuming her medications and willing ACE inhibitor's, diuretics.  Will check orthostatics.  If orthostatic positive, may need to keep supine blood pressure above normal.  CKD stage IIIb: Creatinine at baseline about 1.8.  Pulmonary hypertension and chronic hypoxic respiratory failure: At baseline.  Using 2 to 3 L of oxygen.  Uses CPAP at night.  Type 2 diabetes: Well controlled.  Insulin-dependent.  Continue home doses.   DVT prophylaxis: Therapeutic on  Coumadin   Code Status: Full code Family Communication: None at the bedside Disposition Plan: Status is: Observation  The patient remains OBS appropriate and will d/c before 2 midnights.  Dispo: The patient is from: Home              Anticipated d/c is to: Home              Anticipated d/c date is: 1 day              Patient currently is not medically stable to d/c.         Consultants:   Cardiology  Procedures:   None  Antimicrobials:   None, ciprofloxacin 1 dose on 12/19   Subjective: Patient seen and examined.  She was still in the emergency room.  She denied any complaints today.  She has not ambulated yet.  Denies any chest pain or shortness of breath.  Telemetry awake sinus bradycardia 40-45.  Objective: Vitals:   08/06/20 1300 08/06/20 1345 08/06/20 1400 08/06/20 1413  BP: (!) 152/68   (!) 153/55  Pulse: 76 79  77  Resp: (!) _0 Temp:   98.4 F (36.9 C) 98 F (36.7 C)  TempSrc:   Oral Oral  SpO2: 98% 96%  100%    Intake/Output Summary (Last 24 hours) at 08/06/2020 1425 Last data filed at 08/05/2020 1908 Gross per 24 hour  Intake 600 ml  Output --  Net 600 ml   There were no vitals filed for this visit.  Examination:  General exam: Appears calm and comfortable  Comfortable on 3 L of oxygen. Respiratory system: Clear to auscultation. Respiratory effort normal. Cardiovascular system:  S1 & S2 heard, RRR. No JVD, murmurs, rubs, gallops or clicks.  None pedal edema. Gastrointestinal system: Abdomen is nondistended, soft and nontender. No organomegaly or masses felt. Normal bowel sounds heard.  Obese and pendulous. Central nervous system: Alert and oriented. No focal neurological deficits. Extremities: Symmetric 5 x 5 power. Skin: No rashes, lesions or ulcers Psychiatry: Judgement and insight appear normal. Mood & affect appropriate.     Data Reviewed: I have personally reviewed following labs and imaging studies  CBC: Recent Labs   Lab 08/05/20 1305  WBC 6.3  HGB 9.7*  HCT 33.4*  MCV 100.3*  PLT 440   Basic Metabolic Panel: Recent Labs  Lab 08/05/20 1305 08/06/20 0315  NA 141 141  K 4.8 4.5  CL 105 106  CO2 26 25  GLUCOSE 146* 170*  BUN 43* 41*  CREATININE 1.77* 1.66*  CALCIUM 9.0 9.0   GFR: Estimated Creatinine Clearance: 34.4 mL/min (A) (by C-G formula based on SCr of 1.66 mg/dL (H)). Liver Function Tests: No results for input(s): AST, ALT, ALKPHOS, BILITOT, PROT, ALBUMIN in the last 168 hours. No results for input(s): LIPASE, AMYLASE in the last 168 hours. No results for input(s): AMMONIA in the last 168 hours. Coagulation Profile: Recent Labs  Lab 08/05/20 1931 08/06/20 0315  INR 3.1* 3.0*   Cardiac Enzymes: No results for input(s): CKTOTAL, CKMB, CKMBINDEX, TROPONINI in the last 168 hours. BNP (last 3 results) No results for input(s): PROBNP in the last 8760 hours. HbA1C: No results for input(s): HGBA1C in the last 72 hours. CBG: No results for input(s): GLUCAP in the last 168 hours. Lipid Profile: No results for input(s): CHOL, HDL, LDLCALC, TRIG, CHOLHDL, LDLDIRECT in the last 72 hours. Thyroid Function Tests: No results for input(s): TSH, T4TOTAL, FREET4, T3FREE, THYROIDAB in the last 72 hours. Anemia Panel: No results for input(s): VITAMINB12, FOLATE, FERRITIN, TIBC, IRON, RETICCTPCT in the last 72 hours. Sepsis Labs: No results for input(s): PROCALCITON, LATICACIDVEN in the last 168 hours.  Recent Results (from the past 240 hour(s))  Resp Panel by RT-PCR (Flu A&B, Covid) Nasopharyngeal Swab     Status: None   Collection Time: 08/05/20  5:52 PM   Specimen: Nasopharyngeal Swab; Nasopharyngeal(NP) swabs in vial transport medium  Result Value Ref Range Status   SARS Coronavirus 2 by RT PCR NEGATIVE NEGATIVE Final    Comment: (NOTE) SARS-CoV-2 target nucleic acids are NOT DETECTED.  The SARS-CoV-2 RNA is generally detectable in upper respiratory specimens during the acute  phase of infection. The lowest concentration of SARS-CoV-2 viral copies this assay can detect is 138 copies/mL. A negative result does not preclude SARS-Cov-2 infection and should not be used as the sole basis for treatment or other patient management decisions. A negative result may occur with  improper specimen collection/handling, submission of specimen other than nasopharyngeal swab, presence of viral mutation(s) within the areas targeted by this assay, and inadequate number of viral copies(<138 copies/mL). A negative result must be combined with clinical observations, patient history, and epidemiological information. The expected result is Negative.  Fact Sheet for Patients:  EntrepreneurPulse.com.au  Fact Sheet for Healthcare Providers:  IncredibleEmployment.be  This test is no t yet approved or cleared by the [place] FDA and  has been authorized for detection and/or diagnosis of SARS-CoV-2 by FDA under an Emergency Use Authorization (EUA). This EUA will remain  in effect (meaning this test can be used) for the duration of the COVID-19 declaration under Section 564(b)(1) of the Act, 21  U.S.C.section 360bbb-3(b)(1), unless the authorization is terminated  or revoked sooner.       Influenza A by PCR NEGATIVE NEGATIVE Final   Influenza B by PCR NEGATIVE NEGATIVE Final    Comment: (NOTE) The Xpert Xpress SARS-CoV-2/FLU/RSV plus assay is intended as an aid in the diagnosis of influenza from Nasopharyngeal swab specimens and should not be used as a sole basis for treatment. Nasal washings and aspirates are unacceptable for Xpert Xpress SARS-CoV-2/FLU/RSV testing.  Fact Sheet for Patients: EntrepreneurPulse.com.au  Fact Sheet for Healthcare Providers: IncredibleEmployment.be  This test is not yet approved or cleared by the [place] FDA and has been authorized for detection and/or diagnosis of  SARS-CoV-2 by FDA under an Emergency Use Authorization (EUA). This EUA will remain in effect (meaning this test can be used) for the duration of the COVID-19 declaration under Section 564(b)(1) of the Act, 21 U.S.C. section 360bbb-3(b)(1), unless the authorization is terminated or revoked.  Performed at [facility], [place] [address], [place], [place] [address]          Radiology Studies: CT HEAD WO CONTRAST  Result Date: 08/05/2020 CLINICAL DATA:  Head trauma. EXAM: CT HEAD WITHOUT CONTRAST TECHNIQUE: Contiguous axial images were obtained from the base of the skull through the vertex without intravenous contrast. COMPARISON:  Brain CT 04/09/2020. FINDINGS: Brain: Ventricles and sulci are appropriate for patient's age. No evidence for acute cortically based infarct, intracranial hemorrhage, mass lesion or mass-effect. Vascular: Unremarkable. Skull: Intact. Sinuses/Orbits: Paranasal sinuses well aerated. Mastoid air cells are unremarkable. Other: None. IMPRESSION: No acute intracranial process. Electronically Signed   By: Lovey Newcomer M.D.   On: 08/05/2020 13:38        Scheduled Meds: . ambrisentan  5 mg Oral QHS  . atorvastatin  80 mg Oral QHS  . cholecalciferol  2,000 Units Oral Daily  . ferrous sulfate  325 mg Oral TID WC  . hydrALAZINE  25 mg Oral BID  . insulin glargine  40 Units Subcutaneous QHS  . linagliptin  5 mg Oral Daily  . losartan  25 mg Oral Daily  . pantoprazole  40 mg Oral Daily  . tadalafil  40 mg Oral QHS  . torsemide  20 mg Oral Daily  . vitamin B-12  1,000 mcg Oral Daily   Continuous Infusions:   LOS: 0 days    Time spent: 30 minutes    Barb Merino, MD [facility] Pager [phone]

## 2020-08-06 NOTE — Progress Notes (Signed)
Echocardiogram 2D Echocardiogram has been performed.  Johny Chess 08/06/2020, 5:54 PM

## 2020-08-07 ENCOUNTER — Inpatient Hospital Stay (HOSPITAL_COMMUNITY)
Admission: EM | Disposition: A | Discharge status: Home / Self Care | Payer: Self-pay | Source: Home / Self Care | Attending: Internal Medicine

## 2020-08-07 ENCOUNTER — Observation Stay (HOSPITAL_COMMUNITY): Payer: Medicare Other

## 2020-08-07 ENCOUNTER — Encounter (HOSPITAL_COMMUNITY): Payer: Self-pay | Admitting: Internal Medicine

## 2020-08-07 DIAGNOSIS — I2721 Secondary pulmonary arterial hypertension: Secondary | ICD-10-CM

## 2020-08-07 DIAGNOSIS — I2510 Atherosclerotic heart disease of native coronary artery without angina pectoris: Secondary | ICD-10-CM | POA: Diagnosis present

## 2020-08-07 DIAGNOSIS — Z20822 Contact with and (suspected) exposure to covid-19: Secondary | ICD-10-CM | POA: Diagnosis present

## 2020-08-07 DIAGNOSIS — I4892 Unspecified atrial flutter: Secondary | ICD-10-CM | POA: Diagnosis present

## 2020-08-07 DIAGNOSIS — R001 Bradycardia, unspecified: Secondary | ICD-10-CM | POA: Diagnosis not present

## 2020-08-07 DIAGNOSIS — Z880 Allergy status to penicillin: Secondary | ICD-10-CM | POA: Diagnosis not present

## 2020-08-07 DIAGNOSIS — I130 Hypertensive heart and chronic kidney disease with heart failure and stage 1 through stage 4 chronic kidney disease, or unspecified chronic kidney disease: Secondary | ICD-10-CM | POA: Diagnosis present

## 2020-08-07 DIAGNOSIS — E1122 Type 2 diabetes mellitus with diabetic chronic kidney disease: Secondary | ICD-10-CM | POA: Diagnosis present

## 2020-08-07 DIAGNOSIS — Z6841 Body Mass Index (BMI) 40.0 and over, adult: Secondary | ICD-10-CM | POA: Diagnosis not present

## 2020-08-07 DIAGNOSIS — R55 Syncope and collapse: Secondary | ICD-10-CM | POA: Diagnosis present

## 2020-08-07 DIAGNOSIS — I480 Paroxysmal atrial fibrillation: Secondary | ICD-10-CM | POA: Diagnosis present

## 2020-08-07 DIAGNOSIS — E11649 Type 2 diabetes mellitus with hypoglycemia without coma: Secondary | ICD-10-CM | POA: Diagnosis not present

## 2020-08-07 DIAGNOSIS — I482 Chronic atrial fibrillation, unspecified: Secondary | ICD-10-CM | POA: Diagnosis not present

## 2020-08-07 DIAGNOSIS — Z8673 Personal history of transient ischemic attack (TIA), and cerebral infarction without residual deficits: Secondary | ICD-10-CM | POA: Diagnosis not present

## 2020-08-07 DIAGNOSIS — I5032 Chronic diastolic (congestive) heart failure: Secondary | ICD-10-CM | POA: Diagnosis present

## 2020-08-07 DIAGNOSIS — I2729 Other secondary pulmonary hypertension: Secondary | ICD-10-CM | POA: Diagnosis present

## 2020-08-07 DIAGNOSIS — I358 Other nonrheumatic aortic valve disorders: Secondary | ICD-10-CM | POA: Diagnosis present

## 2020-08-07 DIAGNOSIS — E662 Morbid (severe) obesity with alveolar hypoventilation: Secondary | ICD-10-CM | POA: Diagnosis present

## 2020-08-07 DIAGNOSIS — Z794 Long term (current) use of insulin: Secondary | ICD-10-CM | POA: Diagnosis not present

## 2020-08-07 DIAGNOSIS — J9611 Chronic respiratory failure with hypoxia: Secondary | ICD-10-CM | POA: Diagnosis present

## 2020-08-07 DIAGNOSIS — N1832 Chronic kidney disease, stage 3b: Secondary | ICD-10-CM | POA: Diagnosis present

## 2020-08-07 DIAGNOSIS — I509 Heart failure, unspecified: Secondary | ICD-10-CM | POA: Diagnosis not present

## 2020-08-07 DIAGNOSIS — Z86711 Personal history of pulmonary embolism: Secondary | ICD-10-CM | POA: Diagnosis not present

## 2020-08-07 DIAGNOSIS — I371 Nonrheumatic pulmonary valve insufficiency: Secondary | ICD-10-CM | POA: Diagnosis present

## 2020-08-07 DIAGNOSIS — Z7722 Contact with and (suspected) exposure to environmental tobacco smoke (acute) (chronic): Secondary | ICD-10-CM | POA: Diagnosis present

## 2020-08-07 DIAGNOSIS — Z7901 Long term (current) use of anticoagulants: Secondary | ICD-10-CM | POA: Diagnosis not present

## 2020-08-07 DIAGNOSIS — Z9981 Dependence on supplemental oxygen: Secondary | ICD-10-CM | POA: Diagnosis not present

## 2020-08-07 DIAGNOSIS — R21 Rash and other nonspecific skin eruption: Secondary | ICD-10-CM | POA: Diagnosis present

## 2020-08-07 HISTORY — PX: RIGHT HEART CATH: CATH118263

## 2020-08-07 LAB — POCT I-STAT EG7
Acid-Base Excess: 3 mmol/L — ABNORMAL HIGH (ref 0.0–2.0)
Acid-Base Excess: 3 mmol/L — ABNORMAL HIGH (ref 0.0–2.0)
Acid-Base Excess: 2 mmol/L (ref 0.0–2.0)
Bicarbonate: 29.1 mmol/L — ABNORMAL HIGH (ref 20.0–28.0)
Bicarbonate: 29.3 mmol/L — ABNORMAL HIGH (ref 20.0–28.0)
Bicarbonate: 29 mmol/L — ABNORMAL HIGH (ref 20.0–28.0)
Calcium, Ion: 1.19 mmol/L (ref 1.15–1.40)
Calcium, Ion: 1.3 mmol/L (ref 1.15–1.40)
Calcium, Ion: 1.27 mmol/L (ref 1.15–1.40)
HCT: 29 % — ABNORMAL LOW (ref 36.0–46.0)
HCT: 31 % — ABNORMAL LOW (ref 36.0–46.0)
HCT: 29 % — ABNORMAL LOW (ref 36.0–46.0)
Hemoglobin: 10.5 g/dL — ABNORMAL LOW (ref 12.0–15.0)
Hemoglobin: 9.9 g/dL — ABNORMAL LOW (ref 12.0–15.0)
Hemoglobin: 9.9 g/dL — ABNORMAL LOW (ref 12.0–15.0)
O2 Saturation: 71 %
O2 Saturation: 72 %
O2 Saturation: 72 %
Potassium: 3.9 mmol/L (ref 3.5–5.1)
Potassium: 4.1 mmol/L (ref 3.5–5.1)
Potassium: 4.1 mmol/L (ref 3.5–5.1)
Sodium: 144 mmol/L (ref 135–145)
Sodium: 146 mmol/L — ABNORMAL HIGH (ref 135–145)
Sodium: 144 mmol/L (ref 135–145)
TCO2: 31 mmol/L (ref 22–32)
TCO2: 31 mmol/L (ref 22–32)
TCO2: 31 mmol/L (ref 22–32)
pCO2, Ven: 51.9 mmHg (ref 44.0–60.0)
pCO2, Ven: 54.1 mmHg (ref 44.0–60.0)
pCO2, Ven: 54.1 mmHg (ref 44.0–60.0)
pH, Ven: 7.342 (ref 7.250–7.430)
pH, Ven: 7.357 (ref 7.250–7.430)
pH, Ven: 7.338 (ref 7.250–7.430)
pO2, Ven: 40 mmHg (ref 32.0–45.0)
pO2, Ven: 41 mmHg (ref 32.0–45.0)
pO2, Ven: 41 mmHg (ref 32.0–45.0)

## 2020-08-07 LAB — CBC
HCT: 31.6 % — ABNORMAL LOW (ref 36.0–46.0)
Hemoglobin: 9.8 g/dL — ABNORMAL LOW (ref 12.0–15.0)
MCH: 30.1 pg (ref 26.0–34.0)
MCHC: 31 g/dL (ref 30.0–36.0)
MCV: 96.9 fL (ref 80.0–100.0)
Platelets: 251 10*3/uL (ref 150–400)
RBC: 3.26 MIL/uL — ABNORMAL LOW (ref 3.87–5.11)
RDW: 15.4 % (ref 11.5–15.5)
WBC: 8.7 10*3/uL (ref 4.0–10.5)
nRBC: 0 % (ref 0.0–0.2)

## 2020-08-07 LAB — GLUCOSE, CAPILLARY
Glucose-Capillary: 108 mg/dL — ABNORMAL HIGH (ref 70–99)
Glucose-Capillary: 118 mg/dL — ABNORMAL HIGH (ref 70–99)
Glucose-Capillary: 131 mg/dL — ABNORMAL HIGH (ref 70–99)
Glucose-Capillary: 131 mg/dL — ABNORMAL HIGH (ref 70–99)
Glucose-Capillary: 147 mg/dL — ABNORMAL HIGH (ref 70–99)
Glucose-Capillary: 50 mg/dL — ABNORMAL LOW (ref 70–99)
Glucose-Capillary: 57 mg/dL — ABNORMAL LOW (ref 70–99)
Glucose-Capillary: 59 mg/dL — ABNORMAL LOW (ref 70–99)
Glucose-Capillary: 63 mg/dL — ABNORMAL LOW (ref 70–99)
Glucose-Capillary: 65 mg/dL — ABNORMAL LOW (ref 70–99)

## 2020-08-07 LAB — PROTIME-INR
INR: 2.4 — ABNORMAL HIGH (ref 0.8–1.2)
Prothrombin Time: 25.2 seconds — ABNORMAL HIGH (ref 11.4–15.2)

## 2020-08-07 LAB — BASIC METABOLIC PANEL
Anion gap: 11 (ref 5–15)
BUN: 53 mg/dL — ABNORMAL HIGH (ref 8–23)
CO2: 24 mmol/L (ref 22–32)
Calcium: 9.2 mg/dL (ref 8.9–10.3)
Chloride: 106 mmol/L (ref 98–111)
Creatinine, Ser: 1.97 mg/dL — ABNORMAL HIGH (ref 0.44–1.00)
GFR, Estimated: 25 mL/min — ABNORMAL LOW (ref >60)
Glucose, Bld: 88 mg/dL (ref 70–99)
Potassium: 4.4 mmol/L (ref 3.5–5.1)
Sodium: 141 mmol/L (ref 135–145)

## 2020-08-07 SURGERY — RIGHT HEART CATH
Anesthesia: LOCAL

## 2020-08-07 MED ORDER — LIDOCAINE HCL (PF) 1 % IJ SOLN
INTRAMUSCULAR | Status: DC | PRN
  Administered 2020-08-07: 2 mL via SUBCUTANEOUS

## 2020-08-07 MED ORDER — GADOBUTROL 1 MMOL/ML IV SOLN
10.0000 mL | Freq: Once | INTRAVENOUS | Status: AC | PRN
  Administered 2020-08-07: 10 mL via INTRAVENOUS

## 2020-08-07 MED ORDER — HEPARIN (PORCINE) IN NACL 1000-0.9 UT/500ML-% IV SOLN
INTRAVENOUS | Status: AC
  Filled 2020-08-07: qty 1000

## 2020-08-07 MED ORDER — DEXTROSE 50 % IV SOLN
12.5000 g | INTRAVENOUS | Status: AC
  Administered 2020-08-07: 06:00:00 12.5 g via INTRAVENOUS
  Filled 2020-08-07: qty 50

## 2020-08-07 MED ORDER — LIDOCAINE HCL (PF) 1 % IJ SOLN
INTRAMUSCULAR | Status: AC
  Filled 2020-08-07: qty 30

## 2020-08-07 MED ORDER — SODIUM CHLORIDE 0.9% FLUSH: 3.0000 mL | Freq: Two times a day (BID) | INTRAVENOUS | Status: DC

## 2020-08-07 MED ORDER — HEPARIN (PORCINE) IN NACL 1000-0.9 UT/500ML-% IV SOLN
INTRAVENOUS | Status: DC | PRN
  Administered 2020-08-07: 500 mL

## 2020-08-07 MED ORDER — SODIUM CHLORIDE 0.9 % IV SOLN: 250.0000 mL | INTRAVENOUS | Status: DC | PRN

## 2020-08-07 MED ORDER — SODIUM CHLORIDE 0.9% FLUSH: 3.0000 mL | INTRAVENOUS | Status: DC | PRN

## 2020-08-07 MED ORDER — SODIUM CHLORIDE 0.9 % IV SOLN: INTRAVENOUS | Status: DC

## 2020-08-07 MED ORDER — DEXTROSE 50 % IV SOLN
25.0000 mL | INTRAVENOUS | Status: AC
  Administered 2020-08-07: 15:00:00 25 mL via INTRAVENOUS
  Filled 2020-08-07: qty 50

## 2020-08-07 MED ORDER — GLUCOSE 40 % PO GEL
ORAL | Status: AC
  Filled 2020-08-07: qty 1

## 2020-08-07 SURGICAL SUPPLY — 4 items
CATH BALLN WEDGE 5F 110CM (CATHETERS) ×2 IMPLANT
PACK CARDIAC CATHETERIZATION (CUSTOM PROCEDURE TRAY) ×2 IMPLANT
SHEATH GLIDE SLENDER 4/5FR (SHEATH) ×2 IMPLANT
TRANSDUCER W/STOPCOCK (MISCELLANEOUS) ×2 IMPLANT

## 2020-08-07 NOTE — Progress Notes (Signed)
Inpatient Diabetes Program Recommendations  AACE/ADA: New Consensus Statement on Inpatient Glycemic Control (2015)  Target Ranges:  Prepandial:   less than 140 mg/dL      Peak postprandial:   less than 180 mg/dL (1-2 hours)      Critically ill patients:  140 - 180 mg/dL   Lab Results  Component Value Date   GLUCAP 118 (H) 08/07/2020   HGBA1C 7.0 (A) 07/24/2020    Review of Glycemic Control  Results for ANORA, SCHWENKE (MRN [id number]) as of 08/07/2020 10:24  Ref. Range 08/06/2020 16:29 08/06/2020 20:36 08/07/2020 00:22 08/07/2020 05:19 08/07/2020 05:52  Glucose-Capillary Latest Ref Range: 70 - 99 mg/dL 183 (H) 127 (H) 108 (H) 63 (L) 118 (H)   Diabetes history: DM 2 Outpatient Diabetes medications:  Jardiance 10 mg daily Januvia 25 mg daily Toujeo 50 units q HS Current orders for Inpatient glycemic control:  Novolog sensitive tid with meals and HS Lantus 40 units q HS Tradjenta 5 mg daily Inpatient Diabetes Program Recommendations:   Consider reducing Lantus to 30 units q HS.    Thanks  Adah Perl, RN, BC-ADM Inpatient Diabetes Coordinator Pager [phone] (8a-5p)

## 2020-08-07 NOTE — Progress Notes (Signed)
CBG after treatment was 131. Patient remains asymptomatic. MD notified. Will continue to monitor.

## 2020-08-07 NOTE — Progress Notes (Signed)
Patient's CBG was 65. Patient was asymptomatic. Gave orange juice. Upon recheck was 5. Gave more juice with crackers. Rechecked CBG was 57. Gave glucose gel. Upon recheck CBG was 50. Paged MD. New order for 25 mL dextrose has been given. Patient remains asymptomatic. Will recheck and continue to monitor.

## 2020-08-07 NOTE — Progress Notes (Signed)
PROGRESS NOTE    Ann Flynn  FUW:[id number] DOB: March 06, 1941 DOA: 08/05/2020 PCP: Trinna Post, PA-C    Brief Narrative:  79 year old female with history of hypertension, chronic A. fib status post recent cardioversion, CKD stage IIIb, diabetes, pulmonary hypertension and chronic hypoxic respiratory failure on 3 L of oxygen at home, sleep apnea on CPAP at night presents to the hospital with syncopal episode.  Apparently she had these episodes at least 4 this year.  Yesterday, she was in the church, went back to her car and opened her trunk to keep her wheelchair, felt dizzy and immediately passed out.  Heart rate reported by EMS was 40.  Most of the events have been happening on upright position.  Does feel dizzy before falling but very fast.   Assessment & Plan:   Principal Problem:   Syncope and collapse Active Problems:   CHF (congestive heart failure) (HCC)   Atrial fibrillation, chronic (HCC)  Syncope and collapse: In a patient with multiple medical issues, A. fib, chronic hypoxia. Exact cause unknown.  Unlikely CNS event.  CT scan head was normal. Beta-blockers discontinued. Negative for orthostatic changes. Mobilize. Cardiology following, may need ambulatory event monitoring.  Atrial fibrillation: Paroxysmal.  Recent cardioversion.  Currently with sinus bradycardia.  Therapeutic on Coumadin.  Hypertension, uncontrolled: Blood pressures fairly stable after resuming her medications and ACE inhibitor's, diuretics.    CKD stage IIIb: Creatinine at baseline about 1.8.  Pulmonary hypertension and chronic hypoxic respiratory failure: At baseline.  Using 2 to 3 L of oxygen.  Uses CPAP at night. -Seen by cardiology and heart failure team, underwent right heart cath today with severe pulmonary hypertension.  On ambrisentan and Cialis. -Plan for cardiac MRI today. Further plan as per cardiology.  Type 2 diabetes: Well controlled.  Insulin-dependent.  Continue home doses.  Had  episode of low blood sugar today morning but she was n.p.o. past midnight.  Now she is eating regular, will continue similar doses.   DVT prophylaxis: Therapeutic on Coumadin   Code Status: Full code Family Communication: None at the bedside Disposition Plan: Status is: Observation   Dispo: The patient is from: Home              Anticipated d/c is to: Home              Anticipated d/c date is: 1 day              Patient currently is not medically stable to d/c.  Patient undergoing treatment cardiological work-up.  She is for a cardiac cath today.  She is also for cardiac MRI today.  Actively followed by heart failure team to manage and help with her symptoms.       Consultants:   Cardiology  Procedures:   None  Antimicrobials:   None, ciprofloxacin 1 dose on 12/19   Subjective: Patient seen and examined.  Came back from cath.  No overnight events.  She has not ambulated yet today.  Objective: Vitals:   08/07/20 0831 08/07/20 0833 08/07/20 0834 08/07/20 1015  BP: (!) 151/51 (!) 144/57 (!) 156/55 (!) 170/134  Pulse: (!) 59 (!) 59 (!) 57   Resp: _0 Temp:      TempSrc:      SpO2: 100% 100% 100%   Weight:      Height:        Intake/Output Summary (Last 24 hours) at 08/07/2020 1312 Last data filed at 08/07/2020 0500 Gross per 24  hour  Intake 240 ml  Output 800 ml  Net -560 ml   Filed Weights   08/06/20 1413 08/07/20 0513  Weight: 112.2 kg 111.8 kg    Examination:  General exam: Appears calm and comfortable  Comfortable on 3 L of oxygen. Respiratory system: Clear to auscultation. Respiratory effort normal. Cardiovascular system: S1 & S2 heard, RRR. No JVD, murmurs, rubs, gallops or clicks.  None pedal edema. Gastrointestinal system: Abdomen is nondistended, soft and nontender. No organomegaly or masses felt. Normal bowel sounds heard.  Obese and pendulous. Central nervous system: Alert and oriented. No focal neurological deficits. Extremities:  Symmetric 5 x 5 power. Skin: No rashes, lesions or ulcers Psychiatry: Judgement and insight appear normal. Mood & affect appropriate.     Data Reviewed: I have personally reviewed following labs and imaging studies  CBC: Recent Labs  Lab 08/05/20 1305 08/07/20 0317  WBC 6.3 8.7  HGB 9.7* 9.8*  HCT 33.4* 31.6*  MCV 100.3* 96.9  PLT 233 937   Basic Metabolic Panel: Recent Labs  Lab 08/05/20 1305 08/06/20 0315 08/07/20 0317  NA 141 141 141  K 4.8 4.5 4.4  CL 105 106 106  CO2 _0 GLUCOSE 146* 170* 88  BUN 43* 41* 53*  CREATININE 1.77* 1.66* 1.97*  CALCIUM 9.0 9.0 9.2   GFR: Estimated Creatinine Clearance: 28.8 mL/min (A) (by C-G formula based on SCr of 1.97 mg/dL (H)). Liver Function Tests: No results for input(s): AST, ALT, ALKPHOS, BILITOT, PROT, ALBUMIN in the last 168 hours. No results for input(s): LIPASE, AMYLASE in the last 168 hours. No results for input(s): AMMONIA in the last 168 hours. Coagulation Profile: Recent Labs  Lab 08/05/20 1931 08/06/20 0315 08/07/20 0316  INR 3.1* 3.0* 2.4*   Cardiac Enzymes: No results for input(s): CKTOTAL, CKMB, CKMBINDEX, TROPONINI in the last 168 hours. BNP (last 3 results) No results for input(s): PROBNP in the last 8760 hours. HbA1C: No results for input(s): HGBA1C in the last 72 hours. CBG: Recent Labs  Lab 08/06/20 1629 08/06/20 2036 08/07/20 0022 08/07/20 0519 08/07/20 0552  GLUCAP 183* 127* 108* 63* 118*   Lipid Profile: No results for input(s): CHOL, HDL, LDLCALC, TRIG, CHOLHDL, LDLDIRECT in the last 72 hours. Thyroid Function Tests: No results for input(s): TSH, T4TOTAL, FREET4, T3FREE, THYROIDAB in the last 72 hours. Anemia Panel: No results for input(s): VITAMINB12, FOLATE, FERRITIN, TIBC, IRON, RETICCTPCT in the last 72 hours. Sepsis Labs: No results for input(s): PROCALCITON, LATICACIDVEN in the last 168 hours.  Recent Results (from the past 240 hour(s))  Resp Panel by RT-PCR (Flu A&B,  Covid) Nasopharyngeal Swab     Status: None   Collection Time: 08/05/20  5:52 PM   Specimen: Nasopharyngeal Swab; Nasopharyngeal(NP) swabs in vial transport medium  Result Value Ref Range Status   SARS Coronavirus 2 by RT PCR NEGATIVE NEGATIVE Final    Comment: (NOTE) SARS-CoV-2 target nucleic acids are NOT DETECTED.  The SARS-CoV-2 RNA is generally detectable in upper respiratory specimens during the acute phase of infection. The lowest concentration of SARS-CoV-2 viral copies this assay can detect is 138 copies/mL. A negative result does not preclude SARS-Cov-2 infection and should not be used as the sole basis for treatment or other patient management decisions. A negative result may occur with  improper specimen collection/handling, submission of specimen other than nasopharyngeal swab, presence of viral mutation(s) within the areas targeted by this assay, and inadequate number of viral copies(<138 copies/mL). A negative result must be  combined with clinical observations, patient history, and epidemiological information. The expected result is Negative.  Fact Sheet for Patients:  EntrepreneurPulse.com.au  Fact Sheet for Healthcare Providers:  IncredibleEmployment.be  This test is no t yet approved or cleared by the [place] FDA and  has been authorized for detection and/or diagnosis of SARS-CoV-2 by FDA under an Emergency Use Authorization (EUA). This EUA will remain  in effect (meaning this test can be used) for the duration of the COVID-19 declaration under Section 564(b)(1) of the Act, 21 U.S.C.section 360bbb-3(b)(1), unless the authorization is terminated  or revoked sooner.       Influenza A by PCR NEGATIVE NEGATIVE Final   Influenza B by PCR NEGATIVE NEGATIVE Final    Comment: (NOTE) The Xpert Xpress SARS-CoV-2/FLU/RSV plus assay is intended as an aid in the diagnosis of influenza from Nasopharyngeal swab specimens and should  not be used as a sole basis for treatment. Nasal washings and aspirates are unacceptable for Xpert Xpress SARS-CoV-2/FLU/RSV testing.  Fact Sheet for Patients: EntrepreneurPulse.com.au  Fact Sheet for Healthcare Providers: IncredibleEmployment.be  This test is not yet approved or cleared by the [place] FDA and has been authorized for detection and/or diagnosis of SARS-CoV-2 by FDA under an Emergency Use Authorization (EUA). This EUA will remain in effect (meaning this test can be used) for the duration of the COVID-19 declaration under Section 564(b)(1) of the Act, 21 U.S.C. section 360bbb-3(b)(1), unless the authorization is terminated or revoked.  Performed at [facility], [place] [address], [place], [place] [address]          Radiology Studies: CARDIAC CATHETERIZATION  Result Date: 08/07/2020 Findings: RA = 11 RV = 91/14 PA = 93/20 (47) PCW = 21 Fick cardiac output/index = 8.0/3.7 PVR = 3.1 WU FA sat = 99% PA sat = 72%, 73% High SVC = 72% PAPi = 6.6 Assessment: 1. Moderate to severe PAH with high PA pulse pressure and high cardiac output 2. Moderately elevated left-sided pressures Plan/Discussion: PA pressures are very high bur PA pulsativity and cardiac output much higher than I would suspect in true PAH with pressures of this magnitude. ? possibility of shunt physiology. Will check cMRI. Glori Bickers, MD 9:07 AM    ECHOCARDIOGRAM COMPLETE  Result Date: 08/06/2020    ECHOCARDIOGRAM REPORT   Patient Name:   Ann Flynn Date of Exam: 08/06/2020 Medical Rec #:  [id number]     Height:       65.0 in Accession #:    [id number]    Weight:       247.4 lb Date of Birth:  1941/07/15     BSA:          2.165 m Patient Age:    25 years      BP:           153/55 mmHg Patient Gender: F             HR:           63 bpm. Exam Location:  Inpatient Procedure: 2D Echo Indications:    pulmonary hypertension 416.8  History:        Patient has prior  history of Echocardiogram examinations, most                 recent 03/24/2020. CHF, chronic kidney disease, Arrythmias:Atrial                 Fibrillation, Signs/Symptoms:Syncope; Risk Factors:Sleep Apnea,  Diabetes, Hypertension and Dyslipidemia.  Sonographer:    Johny Chess Referring Phys: [phone] [place]  1. Left ventricular ejection fraction, by estimation, is 60 to 65%. The left ventricle has normal function. The left ventricle has no regional wall motion abnormalities. There is mild left ventricular hypertrophy. Left ventricular diastolic parameters are consistent with Grade II diastolic dysfunction (pseudonormalization). Elevated left ventricular end-diastolic pressure.  2. Right ventricular systolic function is normal. The right ventricular size is normal. Tricuspid regurgitation signal is inadequate for assessing PA pressure.  3. Left atrial size was moderately dilated.  4. Moderate mitral subvalvular calcification.  5. The mitral valve is abnormal. Mild mitral valve regurgitation.  6. The aortic valve is tricuspid. Aortic valve regurgitation is not visualized. Mild aortic valve sclerosis is present, with no evidence of aortic valve stenosis. Aortic valve mean gradient measures 10.0 mmHg.  7. Aortic dilatation noted. There is mild dilatation of the ascending aorta, measuring 39 mm.  8. The inferior vena cava is dilated in size with <50% respiratory variability, suggesting right atrial pressure of 15 mmHg. Comparison(s): Changes from prior study are noted. 03/24/2020: LVDF 55-60%, moderate LVH, moderate RV systolic dysfunction, RVSP 40.5 mmHg, severe LAE. FINDINGS  Left Ventricle: Left ventricular ejection fraction, by estimation, is 60 to 65%. The left ventricle has normal function. The left ventricle has no regional wall motion abnormalities. The left ventricular internal cavity size was normal in size. There is  mild left ventricular hypertrophy. Left ventricular  diastolic parameters are consistent with Grade II diastolic dysfunction (pseudonormalization). Elevated left ventricular end-diastolic pressure. Right Ventricle: The right ventricular size is normal. No increase in right ventricular wall thickness. Right ventricular systolic function is normal. Tricuspid regurgitation signal is inadequate for assessing PA pressure. Left Atrium: Left atrial size was moderately dilated. Right Atrium: Right atrial size was normal in size. Pericardium: There is no evidence of pericardial effusion. Mitral Valve: The mitral valve is abnormal. Mild mitral annular calcification. Moderate mitral subvalvular calcification. Mild mitral valve regurgitation. Tricuspid Valve: The tricuspid valve is grossly normal. Tricuspid valve regurgitation is trivial. Aortic Valve: The aortic valve is tricuspid. Aortic valve regurgitation is not visualized. Mild aortic valve sclerosis is present, with no evidence of aortic valve stenosis. Aortic valve mean gradient measures 10.0 mmHg. Aortic valve peak gradient measures 17.3 mmHg. Aortic valve area, by VTI measures 1.59 cm. Pulmonic Valve: The pulmonic valve was grossly normal. Pulmonic valve regurgitation is trivial. Aorta: Aortic dilatation noted. There is mild dilatation of the ascending aorta, measuring 39 mm. Venous: The inferior vena cava is dilated in size with less than 50% respiratory variability, suggesting right atrial pressure of 15 mmHg. IAS/Shunts: No atrial level shunt detected by color flow Doppler.  LEFT VENTRICLE PLAX 2D LVIDd:         5.00 cm  Diastology LVIDs:         3.60 cm  LV e' medial:    4.13 cm/s LV PW:         1.30 cm  LV E/e' medial:  35.8 LV IVS:        1.10 cm  LV e' lateral:   7.18 cm/s LVOT diam:     1.90 cm  LV E/e' lateral: 20.6 LV SV:         94 LV SV Index:   43 LVOT Area:     2.84 cm  RIGHT VENTRICLE             IVC RV S prime:  13.30 cm/s  IVC diam: 2.40 cm TAPSE (M-mode): 2.1 cm LEFT ATRIUM             Index        RIGHT ATRIUM           Index LA diam:        4.30 cm 1.99 cm/m  RA Area:     19.90 cm LA Vol (A2C):   86.2 ml 39.81 ml/m RA Volume:   52.40 ml  24.20 ml/m LA Vol (A4C):   65.8 ml 30.39 ml/m LA Biplane Vol: 78.9 ml 36.44 ml/m  AORTIC VALVE AV Area (Vmax):    1.53 cm AV Area (Vmean):   1.52 cm AV Area (VTI):     1.59 cm AV Vmax:           208.00 cm/s AV Vmean:          151.000 cm/s AV VTI:            0.591 m AV Peak Grad:      17.3 mmHg AV Mean Grad:      10.0 mmHg LVOT Vmax:         112.50 cm/s LVOT Vmean:        81.100 cm/s LVOT VTI:          0.332 m LVOT/AV VTI ratio: 0.56  AORTA Ao Root diam: 3.50 cm Ao Asc diam:  3.90 cm MITRAL VALVE MV Area (PHT): 1.82 cm     SHUNTS MV Decel Time: 417 msec     Systemic VTI:  0.33 m MV E velocity: 148.00 cm/s  Systemic Diam: 1.90 cm MV A velocity: 99.10 cm/s MV E/A ratio:  1.49 Lyman Bishop MD Electronically signed by Lyman Bishop MD Signature Date/Time: 08/06/2020/7:51:30 PM    Final         Scheduled Meds: . ambrisentan  5 mg Oral QHS  . atorvastatin  80 mg Oral QHS  . cholecalciferol  2,000 Units Oral Daily  . ferrous sulfate  325 mg Oral TID WC  . hydrALAZINE  25 mg Oral BID  . insulin aspart  0-5 Units Subcutaneous QHS  . insulin aspart  0-9 Units Subcutaneous TID WC  . insulin glargine  40 Units Subcutaneous QHS  . linagliptin  5 mg Oral Daily  . losartan  25 mg Oral Daily  . pantoprazole  40 mg Oral Daily  . tadalafil  40 mg Oral QHS  . torsemide  20 mg Oral Daily  . vitamin B-12  1,000 mcg Oral Daily   Continuous Infusions:   LOS: 0 days    Time spent: 30 minutes    Barb Merino, MD [facility] Pager [phone]

## 2020-08-07 NOTE — Consult Note (Signed)
Advanced Heart Failure Team Consult Note   Primary Physician: Trinna Post, PA-C PCP-Cardiologist:  No primary care provider on file.  Reason for Consultation: PAH/syncope  HPI:    Ann Flynn is seen today for evaluation of PAH/syncope at the request of Dr. Acie Fredrickson.   Ann Flynn is a 79 year old womanwith h/omorbid obesity, DM, OSA on CPAP, CKD 4,PAD, CVA, carotid surgery in 2010,pulmonary embolism (2009) and PAH.  She recently moved from [place], [place] in 2020 to be near her family as things were getting harder. Lives by herself in a townhome.  Reports DVT/PE in 2009 and has been on coumadin since. Was diagnosed with PAH in 2016at [facility] in [place].Right and left heart cathat that time reported as nonobstructive CAD and severe PAH with PAP 103/19 (51) with PCWP 5. (see results below) Was told she had idiopathic PAH andstarted on Letairis and cialis. She weighed 255 pounds at the time.   Never smoked but was exposed to 2ndhand smoke.Wears portable O2 and also CPA at night.   I saw her for the first time in 11/20: Felt to have primarily WHO Group III PH. Echo and VQ ordered.   - Echo 07/07/19: EF 55-60% RV mildly dilated with norma function. No RVSP calculated - VQ 07/07/19: No PE   RHC  07/29/19 RA = 8 RV = 81/14 PA = 80/18 (42) PCW =17 Fick cardiac output/index = 9.25/4.25 PVR = 2.70 WU FA sat = 98% PA sat = 75%, 75% High SVC 72%   Repeat RHC 9/21  RA = 8 RV = 65/7 PA = 61/19 (34) PCW = 19 (v= 27) Fick cardiac output/index = 8.1/3.8 PVR = 1.2 WU Ao sat = 99% PA sat = 75%, 78% SVC = 70%   Has seen Dr. Artist [facility] Pulmonaryand had PFTs. Unable to do 6MW.  PFTs 09/28/19  FEV1 1.92 (57%) FVC 2.57 (58%) DLCO 61%  Had a syncopal episode in 8/21. CT head showed small 2 mm subdural hematoma. Echocardiogram was obtained, echocardiogram showed EF 55 to 60%, moderate left ventricular hypertrophy, moderately elevated  pulmonary artery systolic pressure. Amlodipine stopped Outpatient monitor was ordered and showed atrial flutter continuously (100% burden) with rates ranging from 50 to 133 (average 85 bpm). F/u CT scan 8/23 SDH resolved  Underwent DC-CV 07/10/20.  Admitted with syncopal episode. She had run out of the Ambrisentan ( she thinks) on Thursday night and did not take any further meds. (She is a bit unclear on this but seems to have missed only one dose)   On Sunday she was going back to her car to open her trunk to store her wheelchair.  She felt immediately felt dizzy and then passed out.  EMS was called.  Her heart rate was 40 at the time. She says she was wearing her O2. No CP or palpitations at the time. Head CT ok. ECG sinus brady 46. Hs trop not checked.   Echo: 08/06/20 EF 60-65% Grade II DD  RV normal Minimal AS mean grad 10  No significant PI. Trivial TR  RHC today with PAP 93/20 (47) PCWP 21 CO/CI 8.0/3.7 PVR 3.1   Review of Systems: [y] = yes, _0  = no   . General: Weight gain _1 ; Weight loss _2 ; Anorexia _3 ; Fatigue [ y]; Fever _4 ; Chills _5 ; Weakness _6   . Cardiac: Chest pain/pressure _7 ; Resting SOB _8 ; Exertional SOB [ y]; Orthopnea _9 ; Pedal Edema _10 ;  Palpitations _0 ; Syncope Blue.Reese ]; Presyncope _1 ; Paroxysmal nocturnal dyspnea_2   . Pulmonary: Cough _3 ; Wheezing_4 ; Hemoptysis_5 ; Sputum _6 ; Snoring _7   . GI: Vomiting_8 ; Dysphagia_9 ; Melena_10 ; Hematochezia _11 ; Heartburn_12 ; Abdominal pain _13 ; Constipation Blue.Reese ]; Diarrhea _14 ; BRBPR _15   . GU: Hematuria_16 ; Dysuria _17 ; Nocturia_18   . Vascular: Pain in legs with walking _19 ; Pain in feet with lying flat _20 ; Non-healing sores _21 ; Stroke _22 ; TIA _23 ; Slurred speech _24 ;  . Neuro: Headaches_25 ; Vertigo_26 ; Seizures_27 ; Paresthesias_28 ;Blurred vision _29 ; Diplopia _30 ; Vision changes _31   . Ortho/Skin: Arthritis Blue.Reese ]; Joint pain Blue.Reese ]; Muscle pain _32 ; Joint swelling _33 ; Back Pain _34 ; Rash _35   . Psych: Depression[  ]; Anxiety_36   . Heme: Bleeding problems _37 ; Clotting disorders _38 ; Anemia _39   . Endocrine: Diabetes Blue.Reese ]; Thyroid dysfunction_40   Home Medications Prior to Admission medications   Medication Sig Start Date End Date Taking? Authorizing Provider  acetaminophen (TYLENOL) 650 MG CR tablet Take 650-1,300 mg by mouth every 8 (eight) hours as needed for pain.   Yes [provider]  atorvastatin (LIPITOR) 80 MG tablet TAKE 1 TABLET BY MOUTH DAILY Patient taking differently: Take 80 mg by mouth at bedtime. 12/28/19  Yes Trinna Post, PA-C  BD PEN NEEDLE NANO U/F 32G X 4 MM MISC USE TO INJECT ONCE DAILY AS DIRECTED 11/30/19  Yes Trinna Post, PA-C  Cholecalciferol (VITAMIN D3) 50 MCG (2000 UT) TABS Take 2,000 Units by mouth in the morning and at bedtime.   Yes [provider]  clindamycin (CLEOCIN) 300 MG capsule Take 300 mg by mouth 3 (three) times daily. Started on 07-30-20 for 7 days. On day 7 of therapy. 07/30/20  Yes [provider]  docusate sodium (COLACE) 100 MG capsule Take 100 mg by mouth daily as needed (constipation.).    Yes [provider]  empagliflozin (JARDIANCE) 10 MG TABS tablet Take 1 tablet (10 mg total) by mouth daily before breakfast. 07/03/20  Yes Deaira Leckey, Shaune Pascal, MD  FEROSUL 325 (65 Fe) MG tablet TAKE 1 TABLET BY MOUTH 3 TIMES DAILY WITH MEALS Patient taking differently: Take 325 mg by mouth in the morning, at noon, and at bedtime. 06/22/20  Yes Pollak, Adriana M, PA-C  JANUVIA 25 MG tablet TAKE ONE TABLET BY MOUTH EVERY DAY Patient taking differently: Take 25 mg by mouth daily. 04/18/20  Yes Carles Collet M, PA-C  losartan (COZAAR) 25 MG tablet Take 1 tablet (25 mg total) by mouth daily. 07/24/20  Yes Carles Collet M, PA-C  metoprolol tartrate (LOPRESSOR) 25 MG tablet TAKE 1 TABLET BY MOUTH TWICE DAILY Patient taking differently: Take 25 mg by mouth 2 (two) times daily. 05/04/20  Yes Carles Collet M, PA-C  omeprazole  (PRILOSEC) 40 MG capsule TAKE 1 CAPSULE BY MOUTH ONCE DAILY Patient taking differently: Take 40 mg by mouth at bedtime. 05/04/20  Yes Trinna Post, PA-C  ONETOUCH ULTRA test strip USE AS DIRECTED 05/04/20  Yes Trinna Post, PA-C  tadalafil, PAH, (ADCIRCA) 20 MG tablet Take 2 tablets (40 mg total) by mouth at bedtime. 04/17/20  Yes Flora Lipps, MD  torsemide (DEMADEX) 20 MG tablet Take 1 tablet (20 mg total) by mouth daily. 05/03/20  Yes Johannes Everage, Shaune Pascal, MD  TOUJEO SOLOSTAR 300 UNIT/ML Solostar Pen  INJECT 50 UNITS INTO THE SKIN DAILY Patient taking differently: Inject 50 Units into the skin at bedtime. 04/20/20  Yes Trinna Post, PA-C  venlafaxine (EFFEXOR) 75 MG tablet Take 75 mg by mouth daily.    Yes [provider]  vitamin B-12 (CYANOCOBALAMIN) 1000 MCG tablet Take 1,000 mcg by mouth in the morning and at bedtime.   Yes [provider]  warfarin (COUMADIN) 3 MG tablet Take 3 mg on Wed and Saturday Patient taking differently: Take 3 mg by mouth 2 (two) times a week. Take 1 tablet (3 mg) by mouth on Wednesdays & Saturdays in the evening. 04/19/20  Yes Trinna Post, PA-C  ambrisentan (LETAIRIS) 5 MG tablet Take 5 mg by mouth at bedtime. 03/07/20   [provider]  warfarin (COUMADIN) 4 MG tablet Take 4 mg on Sun, Mon, Tues, Thurs, Friday. Patient taking differently: Take 4 mg by mouth See admin instructions. Take 1 tablet (4 mg) by mouth on Sundays, Mondays, Tuesdays, Thursdays & Fridays in the evening. 04/19/20   Trinna Post, PA-C    Past Medical History: Past Medical History:  Diagnosis Date  . A-fib ([place])   . Anemia   . CHF (congestive heart failure) ([place])   . Chronic kidney disease   . Diabetes mellitus without complication ([place])   . Hypertension   . Pulmonary hypertension ([place])   . Syncope 07/2020    Past Surgical History: Past Surgical History:  Procedure Laterality Date  . ABDOMINAL HYSTERECTOMY  1997  . ABDOMINAL HYSTERECTOMY     . CARDIOVERSION N/A 07/10/2020   Procedure: CARDIOVERSION;  Surgeon: Jolaine Artist, MD;  Location: [facility] ENDOSCOPY;  Service: Cardiovascular;  Laterality: N/A;  . cartiod artery surgery  2010   per patient   . CHOLECYSTECTOMY  10/19/2009  . masectomy    . MASTECTOMY  1994  . RIGHT HEART CATH N/A 07/29/2019   Procedure: RIGHT HEART CATH;  Surgeon: Jolaine Artist, MD;  Location: [place] CV LAB;  Service: Cardiovascular;  Laterality: N/A;  . RIGHT HEART CATH N/A 05/11/2020   Procedure: RIGHT HEART CATH;  Surgeon: Jolaine Artist, MD;  Location: [place] CV LAB;  Service: Cardiovascular;  Laterality: N/A;    Family History: Family History  Problem Relation Age of Onset  . CAD Mother   . Heart disease Father   . CAD Maternal Grandmother     Social History: Social History   Socioeconomic History  . Marital status: Widowed    Spouse name: Not on file  . Number of children: Not on file  . Years of education: Not on file  . Highest education level: Not on file  Occupational History  . Not on file  Tobacco Use  . Smoking status: Never Smoker  . Smokeless tobacco: Never Used  Vaping Use  . Vaping Use: Never used  Substance and Sexual Activity  . Alcohol use: Never  . Drug use: Never  . Sexual activity: Not on file  Other Topics Concern  . Not on file  Social History Narrative   ** Merged History Encounter **       Social Determinants of Health   Financial Resource Strain: Low Risk   . Difficulty of Paying Living Expenses: Not hard at all  Food Insecurity: Not on file  Transportation Needs: No Transportation Needs  . Lack of Transportation (Medical): No  . Lack of Transportation (Non-Medical): No  Physical Activity: Not on file  Stress: Not on file  Social Connections: Not on file    Allergies:  Allergies  Allergen Reactions  . Penicillins Swelling    Facial swelling Did it involve swelling of the face/tongue/throat, SOB, or low BP? Yes Did it  involve sudden or severe rash/hives, skin peeling, or any reaction on the inside of your mouth or nose? No Did you need to seek medical attention at a hospital or doctor's office? No When did it last happen?10 + years If all above answers are "NO", may proceed with cephalosporin use.   . Sulfa Antibiotics Rash    Objective:    Vital Signs:   Temp:  [98 F (36.7 C)-98.7 F (37.1 C)] 98.7 F (37.1 C) (12/21 0513) Pulse Rate:  [56-79] 64 (12/21 0513) Resp:  [16-24] 16 (12/21 0513) BP: (126-156)/(47-102) 126/47 (12/21 0513) SpO2:  [92 %-100 %] 100 % (12/21 0746) Weight:  [111.8 kg-112.2 kg] 111.8 kg (12/21 0513) Last BM Date: 08/05/20  Weight change: Filed Weights   08/06/20 1413 08/07/20 0513  Weight: 112.2 kg 111.8 kg    Intake/Output:   Intake/Output Summary (Last 24 hours) at 08/07/2020 4451 Last data filed at 08/07/2020 0500 Gross per 24 hour  Intake 240 ml  Output 800 ml  Net -560 ml      Physical Exam    General:  Elderly woman. No resp difficulty HEENT: normal Neck: supple. JVP to jaw. Carotids 2+ bilat; no bruits. No lymphadenopathy or thyromegaly appreciated. Cor: PMI nondisplaced. Regular brady 2/6 TR loud P2 2/6 AS Lungs: clear Abdomen: soft, nontender, nondistended. No hepatosplenomegaly. No bruits or masses. Good bowel sounds. Extremities: no cyanosis, clubbing, rash, edema Neuro: alert & orientedx3, cranial nerves grossly intact. moves all 4 extremities w/o difficulty. Affect pleasant   Telemetry   Sinus 50-60s Personally reviewed  EKG    Sinusr brady 46 No ST-T wave abnormalities. Personally reviewed  Labs   Basic Metabolic Panel: Recent Labs  Lab 08/05/20 1305 08/06/20 0315 08/07/20 0317  NA 141 141 141  K 4.8 4.5 4.4  CL 105 106 106  CO2 _0 GLUCOSE 146* 170* 88  BUN 43* 41* 53*  CREATININE 1.77* 1.66* 1.97*  CALCIUM 9.0 9.0 9.2    Liver Function Tests: No results for input(s): AST, ALT, ALKPHOS, BILITOT, PROT,  ALBUMIN in the last 168 hours. No results for input(s): LIPASE, AMYLASE in the last 168 hours. No results for input(s): AMMONIA in the last 168 hours.  CBC: Recent Labs  Lab 08/05/20 1305 08/07/20 0317  WBC 6.3 8.7  HGB 9.7* 9.8*  HCT 33.4* 31.6*  MCV 100.3* 96.9  PLT 233 251    Cardiac Enzymes: No results for input(s): CKTOTAL, CKMB, CKMBINDEX, TROPONINI in the last 168 hours.  BNP: BNP (last 3 results) Recent Labs    01/27/20 1150  BNP 450.6*    ProBNP (last 3 results) No results for input(s): PROBNP in the last 8760 hours.   CBG: Recent Labs  Lab 08/06/20 1629 08/06/20 2036 08/07/20 0022 08/07/20 0519 08/07/20 0552  GLUCAP 183* 127* 108* 63* 118*    Coagulation Studies: Recent Labs    08/05/20 1931 08/06/20 0315 08/07/20 0316  LABPROT 31.0* 29.8* 25.2*  INR 3.1* 3.0* 2.4*     Imaging   ECHOCARDIOGRAM COMPLETE  Result Date: 08/06/2020    ECHOCARDIOGRAM REPORT   Patient Name:   Ann Flynn Date of Exam: 08/06/2020 Medical Rec #:  [id number]     Height:       65.0 in Accession #:  [id number]    Weight:       247.4 lb Date of Birth:  1941-05-21     BSA:          2.165 m Patient Age:    13 years      BP:           153/55 mmHg Patient Gender: F             HR:           63 bpm. Exam Location:  Inpatient Procedure: 2D Echo Indications:    pulmonary hypertension 416.8  History:        Patient has prior history of Echocardiogram examinations, most                 recent 03/24/2020. CHF, chronic kidney disease, Arrythmias:Atrial                 Fibrillation, Signs/Symptoms:Syncope; Risk Factors:Sleep Apnea,                 Diabetes, Hypertension and Dyslipidemia.  Sonographer:    Johny Chess Referring Phys: [phone] [place]  1. Left ventricular ejection fraction, by estimation, is 60 to 65%. The left ventricle has normal function. The left ventricle has no regional wall motion abnormalities. There is mild left ventricular hypertrophy. Left  ventricular diastolic parameters are consistent with Grade II diastolic dysfunction (pseudonormalization). Elevated left ventricular end-diastolic pressure.  2. Right ventricular systolic function is normal. The right ventricular size is normal. Tricuspid regurgitation signal is inadequate for assessing PA pressure.  3. Left atrial size was moderately dilated.  4. Moderate mitral subvalvular calcification.  5. The mitral valve is abnormal. Mild mitral valve regurgitation.  6. The aortic valve is tricuspid. Aortic valve regurgitation is not visualized. Mild aortic valve sclerosis is present, with no evidence of aortic valve stenosis. Aortic valve mean gradient measures 10.0 mmHg.  7. Aortic dilatation noted. There is mild dilatation of the ascending aorta, measuring 39 mm.  8. The inferior vena cava is dilated in size with <50% respiratory variability, suggesting right atrial pressure of 15 mmHg. Comparison(s): Changes from prior study are noted. 03/24/2020: LVDF 55-60%, moderate LVH, moderate RV systolic dysfunction, RVSP 40.5 mmHg, severe LAE. FINDINGS  Left Ventricle: Left ventricular ejection fraction, by estimation, is 60 to 65%. The left ventricle has normal function. The left ventricle has no regional wall motion abnormalities. The left ventricular internal cavity size was normal in size. There is  mild left ventricular hypertrophy. Left ventricular diastolic parameters are consistent with Grade II diastolic dysfunction (pseudonormalization). Elevated left ventricular end-diastolic pressure. Right Ventricle: The right ventricular size is normal. No increase in right ventricular wall thickness. Right ventricular systolic function is normal. Tricuspid regurgitation signal is inadequate for assessing PA pressure. Left Atrium: Left atrial size was moderately dilated. Right Atrium: Right atrial size was normal in size. Pericardium: There is no evidence of pericardial effusion. Mitral Valve: The mitral valve is  abnormal. Mild mitral annular calcification. Moderate mitral subvalvular calcification. Mild mitral valve regurgitation. Tricuspid Valve: The tricuspid valve is grossly normal. Tricuspid valve regurgitation is trivial. Aortic Valve: The aortic valve is tricuspid. Aortic valve regurgitation is not visualized. Mild aortic valve sclerosis is present, with no evidence of aortic valve stenosis. Aortic valve mean gradient measures 10.0 mmHg. Aortic valve peak gradient measures 17.3 mmHg. Aortic valve area, by VTI measures 1.59 cm. Pulmonic Valve: The pulmonic valve was grossly normal. Pulmonic valve regurgitation is trivial. Aorta: Aortic  dilatation noted. There is mild dilatation of the ascending aorta, measuring 39 mm. Venous: The inferior vena cava is dilated in size with less than 50% respiratory variability, suggesting right atrial pressure of 15 mmHg. IAS/Shunts: No atrial level shunt detected by color flow Doppler.  LEFT VENTRICLE PLAX 2D LVIDd:         5.00 cm  Diastology LVIDs:         3.60 cm  LV e' medial:    4.13 cm/s LV PW:         1.30 cm  LV E/e' medial:  35.8 LV IVS:        1.10 cm  LV e' lateral:   7.18 cm/s LVOT diam:     1.90 cm  LV E/e' lateral: 20.6 LV SV:         94 LV SV Index:   43 LVOT Area:     2.84 cm  RIGHT VENTRICLE             IVC RV S prime:     13.30 cm/s  IVC diam: 2.40 cm TAPSE (M-mode): 2.1 cm LEFT ATRIUM             Index       RIGHT ATRIUM           Index LA diam:        4.30 cm 1.99 cm/m  RA Area:     19.90 cm LA Vol (A2C):   86.2 ml 39.81 ml/m RA Volume:   52.40 ml  24.20 ml/m LA Vol (A4C):   65.8 ml 30.39 ml/m LA Biplane Vol: 78.9 ml 36.44 ml/m  AORTIC VALVE AV Area (Vmax):    1.53 cm AV Area (Vmean):   1.52 cm AV Area (VTI):     1.59 cm AV Vmax:           208.00 cm/s AV Vmean:          151.000 cm/s AV VTI:            0.591 m AV Peak Grad:      17.3 mmHg AV Mean Grad:      10.0 mmHg LVOT Vmax:         112.50 cm/s LVOT Vmean:        81.100 cm/s LVOT VTI:          0.332 m  LVOT/AV VTI ratio: 0.56  AORTA Ao Root diam: 3.50 cm Ao Asc diam:  3.90 cm MITRAL VALVE MV Area (PHT): 1.82 cm     SHUNTS MV Decel Time: 417 msec     Systemic VTI:  0.33 m MV E velocity: 148.00 cm/s  Systemic Diam: 1.90 cm MV A velocity: 99.10 cm/s MV E/A ratio:  1.49 Lyman Bishop MD Electronically signed by Lyman Bishop MD Signature Date/Time: 08/06/2020/7:51:30 PM    Final       Medications:     Current Medications: . [MAR Hold] ambrisentan  5 mg Oral QHS  . [MAR Hold] atorvastatin  80 mg Oral QHS  . [MAR Hold] cholecalciferol  2,000 Units Oral Daily  . [MAR Hold] ferrous sulfate  325 mg Oral TID WC  . [MAR Hold] hydrALAZINE  25 mg Oral BID  . [MAR Hold] insulin aspart  0-5 Units Subcutaneous QHS  . [MAR Hold] insulin aspart  0-9 Units Subcutaneous TID WC  . [MAR Hold] insulin glargine  40 Units Subcutaneous QHS  . [MAR Hold] linagliptin  5 mg Oral Daily  . [facility] Hold]  losartan  25 mg Oral Daily  . [MAR Hold] pantoprazole  40 mg Oral Daily  . sodium chloride flush  3 mL Intravenous Q12H  . [MAR Hold] tadalafil  40 mg Oral QHS  . [MAR Hold] torsemide  20 mg Oral Daily  . [MAR Hold] vitamin B-12  1,000 mcg Oral Daily     Infusions: . sodium chloride    . sodium chloride 10 mL/hr at 08/07/20 0539     Assessment/Plan   1. Recurrent syncope - given severity of PAH, my suspicion has been that this was related to [facility] but cardiac outputs and PA pulsativity argue against this somewhat unless preload dropped for some reason. She as bradycardic on arrival which may also be contributing factor. So far no events on tele. Will need AT Zio monitor at d/c  2. Pulmonary HTN (severe) with cor pulmonale - suspect this is really WHO Group 3 disease due to OSA/OHS but given markedly elevated PVR may also have component of WHO Group I & IV - VQ 07/07/19: No PE - continue O2 supplementation and CPAP - RV function normal on echo - will continue tadalafil 40 and letairis 5 (unable to tolerate  higher dose of letairis). With low PVR would not add selxipag at this point - PFTs with significant restrictive lung physiology - PA pressures are very high bur PA pulsativity and cardiac output much higher than I would suspect in true PAH with pressures of this magnitude. ? possibility of shunt physiology. Will check cMRI.   3. Chronic diastolic HF - Plan as above - volume status ok   4. CKD 4 - last creatinine 1.9 - Restart Jardiance  5. Nonobstructive CAD - No s/s angina - Continue statin. Off ASA with coumadin  6. H/o PE 2009 - on warfarin.  - VQ 11/20 negative - Does not want to switch to DOAC due to cost  7. Paroxysmal AFL - new diagnosis. Was in AFL in 6/21. ECG from 11/20 was NSR  - s/p DC-CV 07/10/20  Length of Stay: 0  Glori Bickers, MD  08/07/2020, 8:33 AM  Advanced Heart Failure Team Pager [phone] (M-F; 7a - 4p)  Please contact [facility] for night-coverage after hours (4p -7a ) and weekends on [email or web address]

## 2020-08-07 NOTE — Interval H&P Note (Signed)
History and Physical Interval Note:  08/07/2020 8:10 AM  Ann Flynn  has presented today for surgery, with the diagnosis of PAH/syncope  The various methods of treatment have been discussed with the patient and family. After consideration of risks, benefits and other options for treatment, the patient has consented to  Procedure(s): RIGHT HEART CATH (N/A) as a surgical intervention.  The patient's history has been reviewed, patient examined, no change in status, stable for surgery.  I have reviewed the patient's chart and labs.  Questions were answered to the patient's satisfaction.     Monea Pesantez

## 2020-08-08 LAB — BASIC METABOLIC PANEL
Anion gap: 11 (ref 5–15)
BUN: 49 mg/dL — ABNORMAL HIGH (ref 8–23)
CO2: 27 mmol/L (ref 22–32)
Calcium: 8.9 mg/dL (ref 8.9–10.3)
Chloride: 102 mmol/L (ref 98–111)
Creatinine, Ser: 1.97 mg/dL — ABNORMAL HIGH (ref 0.44–1.00)
GFR, Estimated: 25 mL/min — ABNORMAL LOW (ref >60)
Glucose, Bld: 130 mg/dL — ABNORMAL HIGH (ref 70–99)
Potassium: 4.5 mmol/L (ref 3.5–5.1)
Sodium: 140 mmol/L (ref 135–145)

## 2020-08-08 LAB — CBC
HCT: 36.2 % (ref 36.0–46.0)
Hemoglobin: 10.8 g/dL — ABNORMAL LOW (ref 12.0–15.0)
MCH: 29.3 pg (ref 26.0–34.0)
MCHC: 29.8 g/dL — ABNORMAL LOW (ref 30.0–36.0)
MCV: 98.1 fL (ref 80.0–100.0)
Platelets: 278 10*3/uL (ref 150–400)
RBC: 3.69 MIL/uL — ABNORMAL LOW (ref 3.87–5.11)
RDW: 15.5 % (ref 11.5–15.5)
WBC: 6.9 10*3/uL (ref 4.0–10.5)
nRBC: 0 % (ref 0.0–0.2)

## 2020-08-08 LAB — GLUCOSE, CAPILLARY
Glucose-Capillary: 97 mg/dL (ref 70–99)
Glucose-Capillary: 132 mg/dL — ABNORMAL HIGH (ref 70–99)
Glucose-Capillary: 122 mg/dL — ABNORMAL HIGH (ref 70–99)
Glucose-Capillary: 179 mg/dL — ABNORMAL HIGH (ref 70–99)

## 2020-08-08 LAB — PROTIME-INR
INR: 1.6 — ABNORMAL HIGH (ref 0.8–1.2)
Prothrombin Time: 18.8 seconds — ABNORMAL HIGH (ref 11.4–15.2)

## 2020-08-08 MED ORDER — WARFARIN - PHARMACIST DOSING INPATIENT: Freq: Every day | Status: DC

## 2020-08-08 MED ORDER — WARFARIN SODIUM 5 MG PO TABS
5.0000 mg | ORAL_TABLET | Freq: Once | ORAL | Status: AC
  Administered 2020-08-08: 17:00:00 5 mg via ORAL
  Filled 2020-08-08: qty 1

## 2020-08-08 MED FILL — Heparin Sod (Porcine)-NaCl IV Soln 1000 Unit/500ML-0.9%: INTRAVENOUS | Qty: 500 | Status: AC

## 2020-08-08 NOTE — Progress Notes (Addendum)
Advanced Heart Failure Rounding Note  PCP-Cardiologist: No primary care provider on file.   Subjective:    Volume status ok. Wt down 4 lb today. BMP pending.  On 3L [place] which is her home baseline.   INR 1.6 today.    2D Echo  EF 60-65% RV normal   RHC Findings:  RA = 11 RV = 91/14 PA = 93/20 (47) PCW = 21 Fick cardiac output/index = 8.0/3.7 PVR = 3.1 WU FA sat = 99% PA sat = 72%, 73% High SVC = 72% PAPi = 6.6   Assessment: 1. Moderate to severe PAH with high PA pulse pressure and high cardiac output 2. Moderately elevated left-sided pressures  cMRI  1. Motion artifact throughout exam affects quantification of volumes  2.  No evidence of shunt with Qp/Qs = 0.94  3.  Normal LV size and systolic function (EF 72%)  4.  Mild RV dilatation with normal systolic function (EF 09%)  5.  Moderate pulmonary regurgitation (regurgitant fraction 23%)  6. RV insertion site late gadolinium enhancement, which is a nonspecific finding often seen in setting of pulmonary hypertension  7.  Markedly dilated main pulmonary artery, measuring 36m x 485m 8. Mild mitral regurgitation (regurgitant fraction 18%), mild aortic regurgitation (regurgitant fraction 15%), and mild tricuspid regurgitation (regurgitant fraction 12%)   Objective:   Weight Range: 109.9 kg Body mass index is 40.32 kg/m.   Vital Signs:   Temp:  [97.8 F (36.6 C)-98.6 F (37 C)] 98.4 F (36.9 C) (12/22 0430) Pulse Rate:  [57-70] 67 (12/22 0741) Resp:  [16-20] 20 (12/22 0741) BP: (111-132)/(32-60) 127/60 (12/22 0741) SpO2:  [92 %-100 %] 98 % (12/22 0741) Weight:  [109.9 kg] 109.9 kg (12/22 0430) Last BM Date: 08/06/20  Weight change: Filed Weights   08/06/20 1413 08/07/20 0513 08/08/20 0430  Weight: 112.2 kg 111.8 kg 109.9 kg    Intake/Output:   Intake/Output Summary (Last 24 hours) at 08/08/2020 1040 Last data filed at 08/08/2020 0913 Gross per 24 hour  Intake 1260 ml  Output  600 ml  Net 660 ml      Physical Exam    General:  Well appearing obese elderly WF sitting up in chair. No resp difficulty HEENT: Normal Neck: Supple. JVP . Carotids 2+ bilat; no bruits. No lymphadenopathy or thyromegaly appreciated. Cor: PMI nondisplaced. Regular rate & rhythm. No rubs, gallops or murmurs. Lungs: decreased bibasilar BS otherwise Clear Abdomen: Soft, nontender, nondistended. No hepatosplenomegaly. No bruits or masses. Good bowel sounds. Extremities: No cyanosis, clubbing, rash, edema Neuro: Alert & orientedx3, cranial nerves grossly intact. moves all 4 extremities w/o difficulty. Affect pleasant   Telemetry   NSR 60s.   EKG    No new EKG to review   Labs    CBC Recent Labs    08/05/20 1305 08/07/20 0317 08/07/20 0826 08/07/20 0827 08/07/20 0831  WBC 6.3 8.7  --   --   --   HGB 9.7* 9.8*   < > 10.5* 9.9*  HCT 33.4* 31.6*   < > 31.0* 29.0*  MCV 100.3* 96.9  --   --   --   PLT 233 251  --   --   --    < > = values in this interval not displayed.   Basic Metabolic Panel Recent Labs    08/06/20 0315 08/07/20 0317 08/07/20 0826 08/07/20 0827 08/07/20 0831  NA 141 141   < > 144 144  K 4.5 4.4   < >  4.1 4.1  CL 106 106  --   --   --   CO2 25 24  --   --   --   GLUCOSE 170* 88  --   --   --   BUN 41* 53*  --   --   --   CREATININE 1.66* 1.97*  --   --   --   CALCIUM 9.0 9.2  --   --   --    < > = values in this interval not displayed.   Liver Function Tests No results for input(s): AST, ALT, ALKPHOS, BILITOT, PROT, ALBUMIN in the last 72 hours. No results for input(s): LIPASE, AMYLASE in the last 72 hours. Cardiac Enzymes No results for input(s): CKTOTAL, CKMB, CKMBINDEX, TROPONINI in the last 72 hours.  BNP: BNP (last 3 results) Recent Labs    01/27/20 1150  BNP 450.6*    ProBNP (last 3 results) No results for input(s): PROBNP in the last 8760 hours.   D-Dimer No results for input(s): DDIMER in the last 72 hours. Hemoglobin  A1C No results for input(s): HGBA1C in the last 72 hours. Fasting Lipid Panel No results for input(s): CHOL, HDL, LDLCALC, TRIG, CHOLHDL, LDLDIRECT in the last 72 hours. Thyroid Function Tests No results for input(s): TSH, T4TOTAL, T3FREE, THYROIDAB in the last 72 hours.  Invalid input(s): FREET3  Other results:   Imaging    MR CARDIAC MORPHOLOGY W WO CONTRAST  Result Date: 08/07/2020 CLINICAL DATA:  Evaluate for shunt EXAM: CARDIAC MRI TECHNIQUE: The patient was scanned on a 1.5 Tesla Siemens magnet. A dedicated cardiac coil was used. Functional imaging was done using Fiesta sequences. 2,3, and 4 chamber views were done to assess for RWMA's. Modified Simpson's rule using a short axis stack was used to calculate an ejection fraction on a dedicated work Conservation officer, nature. The patient received 10 cc of Gadavist. After 10 minutes inversion recovery sequences were used to assess for infiltration and scar tissue. CONTRAST:  10 cc  of Gadavist FINDINGS: Left ventricle: -Normal size -Normal systolic function -Elevated ECV (34%) -RV insertion site LGE LV EF:  51% (Normal 56-78%) Absolute volumes: LV EDV: 243m (Normal 52-141 mL) LV ESV: 1084m(Normal 13-51 mL) LV SV: 10940mNormal 33-97 mL) CO: 6.8L/min (Normal 2.7-6.0 L/min) Indexed volumes: LV EDV: 70m68m-m (Normal 41-81 mL/sq-m) LV ESV: 46mL107mm (Normal 12-21 mL/sq-m) LV SV: 48mL/10m (Normal 26-56 mL/sq-m) CI: 3.0L/min/sq-m (Normal 1.8-3.8 L/min/sq-m) Right ventricle: Mild dilatation with normal systolic function RV EF: 48% (N66%al 47-80%) Absolute volumes: RV EDV: 220mL (67mal 58-154 mL) RV ESV: 114mL (N28ml 12-68 mL) RV SV: 106mL (No47m 35-98 mL) CO: 6.6L/min (Normal 2.7-6 L/min) Indexed volumes: RV EDV: 97mL/sq-m5mrmal 48-87 mL/sq-m) RV ESV: 50mL/sq-m 23mmal 11-28 mL/sq-m) RV SV: 47mL/sq-m (78mal 27-57 mL/sq-m) CI: 2.9L/min/sq-m (Normal 1.8-3.8 L/min/sq-m) Left atrium: Moderate enlargement Right atrium: Moderate enlargement  Mitral valve: Mild regurgitation (regurgitant fraction 18%) Aortic valve: Mild regurgitation (regurgitant fraction 15%) Tricuspid valve: Mild regurgitation (regurgitant fraction 12%) Pulmonic valve: Moderate regurgitation (regurgitant fraction 23%) Pulmonary artery: Markedly dilated, measuring 52mm x 44mm 21ma: N27ml proximal ascending aorta Pericardium: Small effusion IMPRESSION: 1. Motion artifact throughout exam affects quantification of volumes 2.  No evidence of shunt with Qp/Qs = 0.94 3.  Normal LV size and systolic function (EF 51%) 4.  Mild 59%dilatation with normal systolic function (EF 48%) 5.  Moder93% pulmonary regurgitation (regurgitant fraction 23%) 6. RV insertion site late gadolinium enhancement, which is a nonspecific finding often seen in  setting of pulmonary hypertension 7.  Markedly dilated main pulmonary artery, measuring 8m x 462m8. Mild mitral regurgitation (regurgitant fraction 18%), mild aortic regurgitation (regurgitant fraction 15%), and mild tricuspid regurgitation (regurgitant fraction 12%) Electronically Signed   By: ChOswaldo MilianD   On: 08/07/2020 22:50      Medications:     Scheduled Medications: . ambrisentan  5 mg Oral QHS  . atorvastatin  80 mg Oral QHS  . cholecalciferol  2,000 Units Oral Daily  . ferrous sulfate  325 mg Oral TID WC  . hydrALAZINE  25 mg Oral BID  . insulin aspart  0-5 Units Subcutaneous QHS  . insulin aspart  0-9 Units Subcutaneous TID WC  . linagliptin  5 mg Oral Daily  . losartan  25 mg Oral Daily  . pantoprazole  40 mg Oral Daily  . tadalafil  40 mg Oral QHS  . torsemide  20 mg Oral Daily  . vitamin B-12  1,000 mcg Oral Daily     Infusions:   PRN Medications:  acetaminophen **OR** acetaminophen, albuterol, diphenhydrAMINE, docusate sodium, hydrALAZINE    Assessment/Plan    1. Recurrent Syncope - given severity of PAH, my [facility] has been that [facility]  argue against this somewhat unless preload dropped for some reason. She was bradycardic on arrival which may also be contributing factor. So far no events on tele. Will need AT Zio monitor at d/c  2. Pulmonary HTN (severe) with cor pulmonale - suspect this is really WHO Group 3 disease due to OSA/OHS but given markedly elevated PVR may also have component of WHO Group I & IV - VQ 07/07/19: No PE - continue O2 supplementation and CPAP - RV function normal on echo - will continue tadalafil 40 and letairis 5 (unable to tolerate higher dose of letairis). With low PVR would not add selxipagat this point - PFTs with significant restrictive lung physiology - PA pressures are very high bur PA pulsativity and cardiac output much higher than I would suspect in true PAH with pressures of this magnitude. cMRI showed no evidence of shunt, Qp/Qs=0.94  3. Chronic diastolic HF -Plan as above - volume status ok  - continue torsemide 20 daily   4. CKD 4 - last creatinine 1.97. Repeat BMP pending  -Restart Jardiance  5. Nonobstructive CAD - Nos/s angina - Continue statin. Off ASA with coumadin  6. H/o PE 2009 - on warfarin.  - VQ 11/20 negative - Does not want to switch to DOAC due to cost  7. Paroxysmal AFL - new diagnosis. Was in AFL in 6/21. ECG from 11/20 was NSR - s/p DC-CV 07/10/20 - NSR on tele today  - INR subtherapeutic at 1.6. ? Lovenox bridge if home today    Length of Stay: 1  BrLyda JesterPA-C  08/08/2020, 10:40 AM  Advanced Heart Failure Team Pager [phone]; [phone] Please contact [facility] for night-coverage after hours (4p -7a ) and weekends on [email or web address]  Patient seen and examined with the above-signed Advanced Practice Provider and/or Housestaff. I personally reviewed laboratory data, imaging studies and relevant notes. I independently examined the patient and formulated the important aspects of the plan. I have edited the note to reflect any  of my changes or salient points. I have personally discussed the plan with the patient and/or family.  Cath and MRI results reviewed with her. RV function is normal despite severe PAH. There is  moderate PR. No evidence for anomalous pulmonary venous return.  INR 1.6, Has diuresed well. Rhythm stable.   General:  Elderly woman. No resp difficulty HEENT: normal Neck: supple. JVP 9-10 Carotids 2+ bilat; no bruits. No lymphadenopathy or thryomegaly appreciated. Cor: PMI nondisplaced. Regular rate & rhythm. 2/6 TR Lungs: clear Abdomen: soft, nontender, nondistended. No hepatosplenomegaly. No bruits or masses. Good bowel sounds. Extremities: no cyanosis, clubbing, rash, edema Neuro: alert & orientedx3, cranial nerves grossly intact. moves all 4 extremities w/o difficulty. Affect pleasant  She has severe PAH but with normal RV function so I am not certain syncope related to [facility] but certainly leading concern. Will follow her on tele tonight. Can likely d/c in am with placement of Zio AT continuous monitor prior to going home (we will place),   Glori Bickers, MD  6:28 PM

## 2020-08-08 NOTE — Progress Notes (Addendum)
PROGRESS NOTE    Ann Flynn   XIH:[id number]  DOB: 08/09/1941  DOA: 08/05/2020 PCP: Trinna Post, PA-C   Brief Narrative:  Ann Flynn is a 79 y.o. female with history h/o hypertension, chronic atrial fibrillation s/p recent cardioversion in November, CHF, CKD, diabetes mellitus, pulmonary hypertension who is on 3 L nasal cannula at home presents after a syncopal episode.     Subjective: No new complaints today.     Assessment & Plan:   Principal Problem:   Syncope and collapse - plan for Zio patch tomorrow prior to dc home  Active Problems:   Chronic hypoxia, Chronic CHF (congestive heart failure) and cor pulmonale with severe pulm HTN - s/p right heart cath and cardiac MRI- appreciate cardiology assistance - cont Torsemide daily, Tadalafil, Letairis, O2 and CPAP    Atrial fibrillation, paroxysmal - s/p DCCV in 11/21 - cont Coumadin- INR 1.6 today  DM2- with hypoglycemia while NPO - sugars stable today - follow   CKD 4 - GFR 25 - follow    Time spent in minutes: 25 DVT prophylaxis:  warfarin (COUMADIN) tablet 5 mg  Code Status: Full  Family Communication:  Disposition Plan:  Status is: Inpatient  Remains inpatient appropriate because: Needs Zio patch Dispo: The patient is from: Home              Anticipated d/c is to: Home              Anticipated d/c date is: 1 day              Patient currently is not medically stable to d/c.      Consultants:   cardiology Procedures:   Right heart cath Antimicrobials:  Anti-infectives (From admission, onward)   Start     Dose/Rate Route Frequency Ordered Stop   08/05/20 1745  ciprofloxacin (CIPRO) IVPB 400 mg        400 mg 200 mL/hr over 60 Minutes Intravenous  Once 08/05/20 1742 08/05/20 1831       Objective: Vitals:   08/08/20 0430 08/08/20 0741 08/08/20 1212 08/08/20 1627  BP: (!) 129/51 127/60 (!) 147/46 (!) 149/53  Pulse: (!) 59 67 76 66  Resp: _0 (!) 21  Temp: 98.4 F (36.9 C)    98.3 F (36.8 C)  TempSrc: Oral   Oral  SpO2: 99% 98% 97% 95%  Weight: 109.9 kg     Height:        Intake/Output Summary (Last 24 hours) at 08/08/2020 1703 Last data filed at 08/08/2020 1607 Gross per 24 hour  Intake 1020 ml  Output 2200 ml  Net -1180 ml   Filed Weights   08/06/20 1413 08/07/20 0513 08/08/20 0430  Weight: 112.2 kg 111.8 kg 109.9 kg    Examination: General exam: Appears comfortable  HEENT: PERRLA, oral mucosa moist, no sclera icterus or thrush Respiratory system: Clear to auscultation. Respiratory effort normal. Cardiovascular system: S1 & S2 heard, RRR.   Gastrointestinal system: Abdomen soft, non-tender, nondistended. Normal bowel sounds. Central nervous system: Alert and oriented. No focal neurological deficits. Extremities: No cyanosis, clubbing or edema Skin: No rashes or ulcers Psychiatry:  Mood & affect appropriate.     Data Reviewed: I have personally reviewed following labs and imaging studies  CBC: Recent Labs  Lab 08/05/20 1305 08/07/20 0317 08/07/20 0826 08/07/20 0827 08/07/20 0831 08/08/20 1202  WBC 6.3 8.7  --   --   --  6.9  HGB 9.7* 9.8* 9.9* 10.5*  9.9* 10.8*  HCT 33.4* 31.6* 29.0* 31.0* 29.0* 36.2  MCV 100.3* 96.9  --   --   --  98.1  PLT 233 251  --   --   --  945   Basic Metabolic Panel: Recent Labs  Lab 08/05/20 1305 08/06/20 0315 08/07/20 0317 08/07/20 0826 08/07/20 0827 08/07/20 0831 08/08/20 1202  NA 141 141 141 146* 144 144 140  K 4.8 4.5 4.4 3.9 4.1 4.1 4.5  CL 105 106 106  --   --   --  102  CO2 _0 --   --   --  27  GLUCOSE 146* 170* 88  --   --   --  130*  BUN 43* 41* 53*  --   --   --  49*  CREATININE 1.77* 1.66* 1.97*  --   --   --  1.97*  CALCIUM 9.0 9.0 9.2  --   --   --  8.9   GFR: Estimated Creatinine Clearance: 28.6 mL/min (A) (by C-G formula based on SCr of 1.97 mg/dL (H)). Liver Function Tests: No results for input(s): AST, ALT, ALKPHOS, BILITOT, PROT, ALBUMIN in the last 168 hours. No  results for input(s): LIPASE, AMYLASE in the last 168 hours. No results for input(s): AMMONIA in the last 168 hours. Coagulation Profile: Recent Labs  Lab 08/05/20 1931 08/06/20 0315 08/07/20 0316 08/08/20 0304  INR 3.1* 3.0* 2.4* 1.6*   Cardiac Enzymes: No results for input(s): CKTOTAL, CKMB, CKMBINDEX, TROPONINI in the last 168 hours. BNP (last 3 results) No results for input(s): PROBNP in the last 8760 hours. HbA1C: No results for input(s): HGBA1C in the last 72 hours. CBG: Recent Labs  Lab 08/07/20 1726 08/07/20 2106 08/08/20 0607 08/08/20 1135 08/08/20 1622  GLUCAP 131* 147* 97 132* 122*   Lipid Profile: No results for input(s): CHOL, HDL, LDLCALC, TRIG, CHOLHDL, LDLDIRECT in the last 72 hours. Thyroid Function Tests: No results for input(s): TSH, T4TOTAL, FREET4, T3FREE, THYROIDAB in the last 72 hours. Anemia Panel: No results for input(s): VITAMINB12, FOLATE, FERRITIN, TIBC, IRON, RETICCTPCT in the last 72 hours. Urine analysis:    Component Value Date/Time   COLORURINE YELLOW 08/05/2020 1718   APPEARANCEUR CLEAR 08/05/2020 1718   LABSPEC 1.008 08/05/2020 1718   PHURINE 5.0 08/05/2020 1718   GLUCOSEU 150 (A) 08/05/2020 1718   HGBUR SMALL (A) 08/05/2020 1718   BILIRUBINUR NEGATIVE 08/05/2020 1718   KETONESUR NEGATIVE 08/05/2020 1718   PROTEINUR NEGATIVE 08/05/2020 1718   NITRITE POSITIVE (A) 08/05/2020 1718   LEUKOCYTESUR TRACE (A) 08/05/2020 1718   Sepsis Labs: _1 (procalcitonin:4,lacticidven:4) ) Recent Results (from the past 240 hour(s))  Resp Panel by RT-PCR (Flu A&B, Covid) Nasopharyngeal Swab     Status: None   Collection Time: 08/05/20  5:52 PM   Specimen: Nasopharyngeal Swab; Nasopharyngeal(NP) swabs in vial transport medium  Result Value Ref Range Status   SARS Coronavirus 2 by RT PCR NEGATIVE NEGATIVE Final    Comment: (NOTE) SARS-CoV-2 target nucleic acids are NOT DETECTED.  The SARS-CoV-2 RNA is generally detectable in upper  respiratory specimens during the acute phase of infection. The lowest concentration of SARS-CoV-2 viral copies this assay can detect is 138 copies/mL. A negative result does not preclude SARS-Cov-2 infection and should not be used as the sole basis for treatment or other patient management decisions. A negative result may occur with  improper specimen collection/handling, submission of specimen other than nasopharyngeal swab, presence of viral mutation(s) within the areas  targeted by this assay, and inadequate number of viral copies(<138 copies/mL). A negative result must be combined with clinical observations, patient history, and epidemiological information. The expected result is Negative.  Fact Sheet for Patients:  EntrepreneurPulse.com.au  Fact Sheet for Healthcare Providers:  IncredibleEmployment.be  This test is no t yet approved or cleared by the [place] FDA and  has been authorized for detection and/or diagnosis of SARS-CoV-2 by FDA under an Emergency Use Authorization (EUA). This EUA will remain  in effect (meaning this test can be used) for the duration of the COVID-19 declaration under Section 564(b)(1) of the Act, 21 U.S.C.section 360bbb-3(b)(1), unless the authorization is terminated  or revoked sooner.       Influenza A by PCR NEGATIVE NEGATIVE Final   Influenza B by PCR NEGATIVE NEGATIVE Final    Comment: (NOTE) The Xpert Xpress SARS-CoV-2/FLU/RSV plus assay is intended as an aid in the diagnosis of influenza from Nasopharyngeal swab specimens and should not be used as a sole basis for treatment. Nasal washings and aspirates are unacceptable for Xpert Xpress SARS-CoV-2/FLU/RSV testing.  Fact Sheet for Patients: EntrepreneurPulse.com.au  Fact Sheet for Healthcare Providers: IncredibleEmployment.be  This test is not yet approved or cleared by the [place] FDA and has been  authorized for detection and/or diagnosis of SARS-CoV-2 by FDA under an Emergency Use Authorization (EUA). This EUA will remain in effect (meaning this test can be used) for the duration of the COVID-19 declaration under Section 564(b)(1) of the Act, 21 U.S.C. section 360bbb-3(b)(1), unless the authorization is terminated or revoked.  Performed at [facility], [place] [address], [place], [place] [address]          Radiology Studies: CARDIAC CATHETERIZATION  Result Date: 08/07/2020 Findings: RA = 11 RV = 91/14 PA = 93/20 (47) PCW = 21 Fick cardiac output/index = 8.0/3.7 PVR = 3.1 WU FA sat = 99% PA sat = 72%, 73% High SVC = 72% PAPi = 6.6 Assessment: 1. Moderate to severe PAH with high PA pulse pressure and high cardiac output 2. Moderately elevated left-sided pressures Plan/Discussion: PA pressures are very high bur PA pulsativity and cardiac output much higher than I would suspect in true PAH with pressures of this magnitude. ? possibility of shunt physiology. Will check cMRI. Glori Bickers, MD 9:07 AM    MR ANGIO CHEST W WO CONTRAST  Result Date: 08/08/2020 CLINICAL DATA:  Pulmonary hypertension. EXAM: MRA CHEST WITH OR WITHOUT CONTRAST TECHNIQUE: Angiographic images of the chest were obtained using MRA technique with without intravenous contrast. The coronary and cardiac components were interpreted by Cardiology. CONTRAST:  76m GADAVIST GADOBUTROL 1 MMOL/ML IV SOLN COMPARISON:  CT 01/11/2020 FINDINGS: Aorta: No aneurysm, dissection, or stenosis. Classic 3 vessel brachiocephalic arterial origin anatomy without proximal stenosis. Visualized proximal abdominal aorta unremarkable. Heart: Heart size normal. No pericardial effusion. Mediastinum/Nodes: No mass or adenopathy. Lungs/Pleura: No pleural effusion.  No pulmonary mass evident. Upper Abdomen: No acute findings. Musculoskeletal: Right breast prosthesis. IMPRESSION: Negative. Electronically Signed   By: DLucrezia EuropeM.D.   On:  08/08/2020 11:25   MR CARDIAC MORPHOLOGY W WO CONTRAST  Result Date: 08/07/2020 CLINICAL DATA:  Evaluate for shunt EXAM: CARDIAC MRI TECHNIQUE: The patient was scanned on a 1.5 Tesla Siemens magnet. A dedicated cardiac coil was used. Functional imaging was done using Fiesta sequences. 2,3, and 4 chamber views were done to assess for RWMA's. Modified Simpson's rule using a short axis stack was used to calculate an ejection fraction on  a dedicated work Conservation officer, nature. The patient received 10 cc of Gadavist. After 10 minutes inversion recovery sequences were used to assess for infiltration and scar tissue. CONTRAST:  10 cc  of Gadavist FINDINGS: Left ventricle: -Normal size -Normal systolic function -Elevated ECV (34%) -RV insertion site LGE LV EF:  51% (Normal 56-78%) Absolute volumes: LV EDV: 291m (Normal 52-141 mL) LV ESV: 1063m(Normal 13-51 mL) LV SV: 10944mNormal 33-97 mL) CO: 6.8L/min (Normal 2.7-6.0 L/min) Indexed volumes: LV EDV: 31m60m-m (Normal 41-81 mL/sq-m) LV ESV: 46mL38mm (Normal 12-21 mL/sq-m) LV SV: 48mL/15m (Normal 26-56 mL/sq-m) CI: 3.0L/min/sq-m (Normal 1.8-3.8 L/min/sq-m) Right ventricle: Mild dilatation with normal systolic function RV EF: 48% (N08%al 47-80%) Absolute volumes: RV EDV: 220mL (43mal 58-154 mL) RV ESV: 114mL (N38ml 12-68 mL) RV SV: 106mL (No50m 35-98 mL) CO: 6.6L/min (Normal 2.7-6 L/min) Indexed volumes: RV EDV: 97mL/sq-m29mrmal 48-87 mL/sq-m) RV ESV: 50mL/sq-m 5mmal 11-28 mL/sq-m) RV SV: 47mL/sq-m (74mal 27-57 mL/sq-m) CI: 2.9L/min/sq-m (Normal 1.8-3.8 L/min/sq-m) Left atrium: Moderate enlargement Right atrium: Moderate enlargement Mitral valve: Mild regurgitation (regurgitant fraction 18%) Aortic valve: Mild regurgitation (regurgitant fraction 15%) Tricuspid valve: Mild regurgitation (regurgitant fraction 12%) Pulmonic valve: Moderate regurgitation (regurgitant fraction 23%) Pulmonary artery: Markedly dilated, measuring 52mm x 44mm 84ma: N79ml  proximal ascending aorta Pericardium: Small effusion IMPRESSION: 1. Motion artifact throughout exam affects quantification of volumes 2.  No evidence of shunt with Qp/Qs = 0.94 3.  Normal LV size and systolic function (EF 51%) 4.  Mild 65%dilatation with normal systolic function (EF 48%) 5.  Moder78% pulmonary regurgitation (regurgitant fraction 23%) 6. RV insertion site late gadolinium enhancement, which is a nonspecific finding often seen in setting of pulmonary hypertension 7.  Markedly dilated main pulmonary artery, measuring 52mm x 44mm 8.74md mi53m regurgitation (regurgitant fraction 18%), mild aortic regurgitation (regurgitant fraction 15%), and mild tricuspid regurgitation (regurgitant fraction 12%) Electronically Signed   By: Christopher  SchOswaldo Milian2021 22:50   ECHOCARDIOGRAM COMPLETE  Result Date: 08/06/2020    ECHOCARDIOGRAM REPORT   Patient Name:   Ann Flynn DaTIONNA GIGANTE2/20/2021 Medical Rec #:  [id number]     [id number]   65.0 in Accession #:    [id number]    [id number]  247.4 lb Date of Birth:  27-May-1941     BS1942/08/28   2.165 m Patient Age:    79 years      BP30          153/55 mmHg Patient Gender: F             HR:           63 bpm. Exam Location:  Inpatient Procedure: 2D Echo Indications:    pulmonary hypertension 416.8  History:        Patient has prior history of Echocardiogram examinations, most                 recent 03/24/2020. CHF, chronic kidney disease, Arrythmias:Atrial                 Fibrillation, Signs/Symptoms:Syncope; Risk Factors:Sleep Apnea,                 Diabetes, Hypertension and Dyslipidemia.  Sonographer:    Lauren PenningtoJohny Chess [id number] [phone] Modenacular ejection fraction, by estimation, is 60 to 65%. The left ventricle has normal function. The left  ventricle has no regional wall motion abnormalities. There is mild left ventricular hypertrophy. Left ventricular diastolic parameters are consistent with Grade  II diastolic dysfunction (pseudonormalization). Elevated left ventricular end-diastolic pressure.  2. Right ventricular systolic function is normal. The right ventricular size is normal. Tricuspid regurgitation signal is inadequate for assessing PA pressure.  3. Left atrial size was moderately dilated.  4. Moderate mitral subvalvular calcification.  5. The mitral valve is abnormal. Mild mitral valve regurgitation.  6. The aortic valve is tricuspid. Aortic valve regurgitation is not visualized. Mild aortic valve sclerosis is present, with no evidence of aortic valve stenosis. Aortic valve mean gradient measures 10.0 mmHg.  7. Aortic dilatation noted. There is mild dilatation of the ascending aorta, measuring 39 mm.  8. The inferior vena cava is dilated in size with <50% respiratory variability, suggesting right atrial pressure of 15 mmHg. Comparison(s): Changes from prior study are noted. 03/24/2020: LVDF 55-60%, moderate LVH, moderate RV systolic dysfunction, RVSP 40.5 mmHg, severe LAE. FINDINGS  Left Ventricle: Left ventricular ejection fraction, by estimation, is 60 to 65%. The left ventricle has normal function. The left ventricle has no regional wall motion abnormalities. The left ventricular internal cavity size was normal in size. There is  mild left ventricular hypertrophy. Left ventricular diastolic parameters are consistent with Grade II diastolic dysfunction (pseudonormalization). Elevated left ventricular end-diastolic pressure. Right Ventricle: The right ventricular size is normal. No increase in right ventricular wall thickness. Right ventricular systolic function is normal. Tricuspid regurgitation signal is inadequate for assessing PA pressure. Left Atrium: Left atrial size was moderately dilated. Right Atrium: Right atrial size was normal in size. Pericardium: There is no evidence of pericardial effusion. Mitral Valve: The mitral valve is abnormal. Mild mitral annular calcification. Moderate mitral  subvalvular calcification. Mild mitral valve regurgitation. Tricuspid Valve: The tricuspid valve is grossly normal. Tricuspid valve regurgitation is trivial. Aortic Valve: The aortic valve is tricuspid. Aortic valve regurgitation is not visualized. Mild aortic valve sclerosis is present, with no evidence of aortic valve stenosis. Aortic valve mean gradient measures 10.0 mmHg. Aortic valve peak gradient measures 17.3 mmHg. Aortic valve area, by VTI measures 1.59 cm. Pulmonic Valve: The pulmonic valve was grossly normal. Pulmonic valve regurgitation is trivial. Aorta: Aortic dilatation noted. There is mild dilatation of the ascending aorta, measuring 39 mm. Venous: The inferior vena cava is dilated in size with less than 50% respiratory variability, suggesting right atrial pressure of 15 mmHg. IAS/Shunts: No atrial level shunt detected by color flow Doppler.  LEFT VENTRICLE PLAX 2D LVIDd:         5.00 cm  Diastology LVIDs:         3.60 cm  LV e' medial:    4.13 cm/s LV PW:         1.30 cm  LV E/e' medial:  35.8 LV IVS:        1.10 cm  LV e' lateral:   7.18 cm/s LVOT diam:     1.90 cm  LV E/e' lateral: 20.6 LV SV:         94 LV SV Index:   43 LVOT Area:     2.84 cm  RIGHT VENTRICLE             IVC RV S prime:     13.30 cm/s  IVC diam: 2.40 cm TAPSE (M-mode): 2.1 cm LEFT ATRIUM             Index       RIGHT ATRIUM  Index LA diam:        4.30 cm 1.99 cm/m  RA Area:     19.90 cm LA Vol (A2C):   86.2 ml 39.81 ml/m RA Volume:   52.40 ml  24.20 ml/m LA Vol (A4C):   65.8 ml 30.39 ml/m LA Biplane Vol: 78.9 ml 36.44 ml/m  AORTIC VALVE AV Area (Vmax):    1.53 cm AV Area (Vmean):   1.52 cm AV Area (VTI):     1.59 cm AV Vmax:           208.00 cm/s AV Vmean:          151.000 cm/s AV VTI:            0.591 m AV Peak Grad:      17.3 mmHg AV Mean Grad:      10.0 mmHg LVOT Vmax:         112.50 cm/s LVOT Vmean:        81.100 cm/s LVOT VTI:          0.332 m LVOT/AV VTI ratio: 0.56  AORTA Ao Root diam: 3.50 cm Ao Asc  diam:  3.90 cm MITRAL VALVE MV Area (PHT): 1.82 cm     SHUNTS MV Decel Time: 417 msec     Systemic VTI:  0.33 m MV E velocity: 148.00 cm/s  Systemic Diam: 1.90 cm MV A velocity: 99.10 cm/s MV E/A ratio:  1.49 Lyman Bishop MD Electronically signed by Lyman Bishop MD Signature Date/Time: 08/06/2020/7:51:30 PM    Final       Scheduled Meds: . ambrisentan  5 mg Oral QHS  . atorvastatin  80 mg Oral QHS  . cholecalciferol  2,000 Units Oral Daily  . ferrous sulfate  325 mg Oral TID WC  . hydrALAZINE  25 mg Oral BID  . insulin aspart  0-5 Units Subcutaneous QHS  . insulin aspart  0-9 Units Subcutaneous TID WC  . linagliptin  5 mg Oral Daily  . losartan  25 mg Oral Daily  . pantoprazole  40 mg Oral Daily  . tadalafil  40 mg Oral QHS  . torsemide  20 mg Oral Daily  . vitamin B-12  1,000 mcg Oral Daily  . warfarin  5 mg Oral ONCE-1600  . Warfarin - Pharmacist Dosing Inpatient   Does not apply q1600   Continuous Infusions:   LOS: 1 day      Debbe Odea, MD [facility] Pager: [email or web address] 08/08/2020, 5:03 PM

## 2020-08-08 NOTE — Progress Notes (Signed)
ANTICOAGULATION CONSULT NOTE - Initial Consult  Pharmacy Consult for warfarin Indication: atrial fibrillation  Allergies  Allergen Reactions  . Penicillins Swelling    Facial swelling Did it involve swelling of the face/tongue/throat, SOB, or low BP? Yes Did it involve sudden or severe rash/hives, skin peeling, or any reaction on the inside of your mouth or nose? No Did you need to seek medical attention at a hospital or doctor's office? No When did it last happen?10 + years If all above answers are "NO", may proceed with cephalosporin use.   . Sulfa Antibiotics Rash    Patient Measurements: Height: _0  (165.1 cm) Weight: 109.9 kg (242 lb 4.6 oz) IBW/kg (Calculated) : 57   Vital Signs: Temp: 98.4 F (36.9 C) (12/22 0430) Temp Source: Oral (12/22 0430) BP: 147/46 (12/22 1212) Pulse Rate: 76 (12/22 1212)  Labs: Recent Labs    08/06/20 0315 08/07/20 0316 08/07/20 0317 08/07/20 0826 08/07/20 0827 08/07/20 0831 08/08/20 0304 08/08/20 1202  HGB  --   --  9.8*   < > 10.5* 9.9*  --  10.8*  HCT  --   --  31.6*   < > 31.0* 29.0*  --  36.2  PLT  --   --  251  --   --   --   --  278  LABPROT 29.8* 25.2*  --   --   --   --  18.8*  --   INR 3.0* 2.4*  --   --   --   --  1.6*  --   CREATININE 1.66*  --  1.97*  --   --   --   --  1.97*   < > = values in this interval not displayed.    Estimated Creatinine Clearance: 28.6 mL/min (A) (by C-G formula based on SCr of 1.97 mg/dL (H)).   Medical History: Past Medical History:  Diagnosis Date  . A-fib ([place])   . Anemia   . CHF (congestive heart failure) ([place])   . Chronic kidney disease   . Diabetes mellitus without complication ([place])   . Hypertension   . Pulmonary hypertension ([place])   . Syncope 07/2020    Medications:  Scheduled:  . ambrisentan  5 mg Oral QHS  . atorvastatin  80 mg Oral QHS  . cholecalciferol  2,000 Units Oral Daily  . ferrous sulfate  325 mg Oral TID WC  . hydrALAZINE  25 mg Oral BID  .  insulin aspart  0-5 Units Subcutaneous QHS  . insulin aspart  0-9 Units Subcutaneous TID WC  . linagliptin  5 mg Oral Daily  . losartan  25 mg Oral Daily  . pantoprazole  40 mg Oral Daily  . tadalafil  40 mg Oral QHS  . torsemide  20 mg Oral Daily  . vitamin B-12  1,000 mcg Oral Daily  . warfarin  5 mg Oral ONCE-1600  . Warfarin - Pharmacist Dosing Inpatient   Does not apply q1600    Assessment:  79 yo female on chronic Coumadin for afib, which was held in anticipation of heart cath performed 12/21.  Pharmacy asked to resume Coumadin today.  Coumadin dose prior to admission was 4 mg daily except 3 mg on Wed and Sat.  INR down to 1.6 today after holding Coumadin for several days.  No overt bleeding or complications noted.  Discussed with Dr. [name], he does not feel she needs to be bridged with IV heparin.   DCCV was 4 weeks  ago.  Goal of Therapy:  INR 2-3 Monitor platelets by anticoagulation protocol: Yes   Plan:  Coumadin 5 mg po x 1 tonight. Recommend resuming previous home dose of Coumadin at discharge. Daily INR.  [place] Crane, Roylene Reason, BCCP Clinical Pharmacist  08/08/2020 3:38 PM   [facility] pharmacy phone numbers are listed on [email or web address]

## 2020-08-09 ENCOUNTER — Ambulatory Visit (HOSPITAL_COMMUNITY)
Admission: RE | Admit: 2020-08-09 | Discharge: 2020-08-09 | Disposition: A | Discharge status: Home / Self Care | Payer: Medicare Other | Source: Ambulatory Visit | Attending: Internal Medicine | Admitting: Internal Medicine

## 2020-08-09 ENCOUNTER — Other Ambulatory Visit (HOSPITAL_COMMUNITY): Payer: Self-pay | Admitting: Internal Medicine

## 2020-08-09 DIAGNOSIS — R55 Syncope and collapse: Secondary | ICD-10-CM

## 2020-08-09 DIAGNOSIS — R001 Bradycardia, unspecified: Secondary | ICD-10-CM

## 2020-08-09 DIAGNOSIS — N184 Chronic kidney disease, stage 4 (severe): Secondary | ICD-10-CM

## 2020-08-09 LAB — PROTIME-INR
INR: 1.4 — ABNORMAL HIGH (ref 0.8–1.2)
Prothrombin Time: 16.8 seconds — ABNORMAL HIGH (ref 11.4–15.2)

## 2020-08-09 LAB — GLUCOSE, CAPILLARY
Glucose-Capillary: 169 mg/dL — ABNORMAL HIGH (ref 70–99)
Glucose-Capillary: 204 mg/dL — ABNORMAL HIGH (ref 70–99)

## 2020-08-09 LAB — CBC
HCT: 35.1 % — ABNORMAL LOW (ref 36.0–46.0)
Hemoglobin: 10.5 g/dL — ABNORMAL LOW (ref 12.0–15.0)
MCH: 29.2 pg (ref 26.0–34.0)
MCHC: 29.9 g/dL — ABNORMAL LOW (ref 30.0–36.0)
MCV: 97.5 fL (ref 80.0–100.0)
Platelets: 242 10*3/uL (ref 150–400)
RBC: 3.6 MIL/uL — ABNORMAL LOW (ref 3.87–5.11)
RDW: 15.2 % (ref 11.5–15.5)
WBC: 6.5 10*3/uL (ref 4.0–10.5)
nRBC: 0 % (ref 0.0–0.2)

## 2020-08-09 MED ORDER — WARFARIN SODIUM 5 MG PO TABS: 5.0000 mg | ORAL_TABLET | Freq: Once | ORAL | Status: DC

## 2020-08-09 MED ORDER — VENLAFAXINE HCL ER 75 MG PO CP24
75.0000 mg | ORAL_CAPSULE | Freq: Every day | ORAL | Status: DC
  Administered 2020-08-09: 12:00:00 75 mg via ORAL
  Filled 2020-08-09: qty 1

## 2020-08-09 NOTE — Progress Notes (Signed)
Mobility Specialist Criteria Algorithm Info.  SATURATION QUALIFICATIONS: (This note is used to comply with regulatory documentation for home oxygen)  Patient Saturations on Room Air at Rest = n/a%  Patient Saturations on Room Air while Ambulating = n/a%  Patient Saturations on 3 Liters of oxygen while Ambulating = 98%  Please briefly explain why patient needs home oxygen:  Mobility Team:  HOB elevated: Activity: Ambulated in hall; Dangled on edge of bed Range of motion: Active; All extremities Level of assistance: Contact guard assist, steadying assist Assistive device: Front wheel walker Minutes sitting in chair:  Minutes stood: 5 minutes Minutes ambulated: 5 minutes Distance ambulated (ft): 80 ft Mobility response: Tolerated well Bed Position: Semi-fowlers  Pt anxious to be discharged but agreeable to participate in mobility this morning. Per report from patient she's independent with ADL's  and uses Rolator intermittently depending on distance. Orthostatic VS documented in Flowsheet. She stood without assistance and ambulated min guard in hallway with Rolator. Required cues for hand placement on RW. Tolerated ambulation well without incident and denied any dizziness/lightheadedness. Patient is now lying supine in bed with all needs met and no complaints.   08/09/2020 11:04 AM

## 2020-08-09 NOTE — Discharge Instructions (Signed)
You were cared for by a hospitalist during your hospital stay. If you have any questions about your discharge medications or the care you received while you were in the hospital after you are discharged, you can call the unit and asked to speak with the hospitalist on call if the hospitalist that took care of you is not available. Once you are discharged, your primary care physician will handle any further medical issues.   Please note that NO REFILLS for any discharge medications will be authorized once you are discharged, as it is imperative that you return to your primary care physician (or establish a relationship with a primary care physician if you do not have one) for your aftercare needs so that they can reassess your need for medications and monitor your lab values.  Please take all your medications with you for your next visit with your Primary MD. Please ask your Primary MD to get all Hospital records sent to his/her office. Please request your Primary MD to go over all hospital test results at the follow up.   If you experience worsening of your admission symptoms, develop shortness of breath, chest pain, suicidal or homicidal thoughts or a life threatening emergency, you must seek medical attention immediately by calling 911 or calling your MD.   Dennis Bast must read the complete instructions/literature along with all the possible adverse reactions/side effects for all the medicines you take including new medications that have been prescribed to you. Take new medicines after you have completely understood and accpet all the possible adverse reactions/side effects.    Do not drive when taking pain medications or sedatives.     Do not take more than prescribed Pain, Sleep and Anxiety Medications   If you have smoked or chewed Tobacco in the last 2 yrs please stop. Stop any regular alcohol  and or recreational drug use.   Wear Seat belts while driving.

## 2020-08-09 NOTE — Discharge Summary (Signed)
Physician Discharge Summary  Bunny Kleist WCB:[id number] DOB: June 23, 1941 DOA: 08/05/2020  PCP: Trinna Post, PA-C  Admit date: 08/05/2020 Discharge date: 08/09/2020  Admitted From: home Disposition:  home   Recommendations for Outpatient Follow-up:  1. F/u on dizzy spells  Home Health:  none  Discharge Condition:  stable   CODE STATUS:  Full code   Diet recommendation:  Heart healthy Consultations:  Cardiology and heart failure team  Procedures/Studies: . See below   Discharge Diagnoses:  Principal Problem:   Syncope and collapse Active Problems:   Pulmonary hypertension (HCC)   Chronic diastolic CHF (congestive heart failure) (HCC)   CKD (chronic kidney disease), stage IV (HCC)   Morbid (severe) obesity due to excess calories (HCC)   Atrial fibrillation, chronic (HCC)   Type 2 diabetes mellitus with stage 4 chronic kidney disease ([place])  Brief Summary: Ann Flynn is a 79 y.o.femalewith chronic atrial fibrillation s/p recent cardioversion in November, CHF, CKD 4, diabetes mellitus, pulmonary hypertension who is on 3 L nasal cannula at home presents after a syncopal episode associated with dizziness prior to passing out. She was going around her car to her trunk when it occurred. She ended up hitting her head but sustained no major injury. She was wearing her oxygen when this occurred. Her friend witnessed the episode. HR was noted to be in the 40-50s when EMS arrived and checked her. She admits to having multiple dizzy spells over the past few months. She admits to running out of her [name] recently.   Hospital Course:  Principal Problem:   Syncope and collapse - cardiac work up including an ECHO, cardiac MRI and right heart cath completed- no specific cause found for her syncope and episodes of dizziness - Zio patch placed today- cardiology will follow as outpatient  Active Problems: Cor pulmonale with severe pulm HTN and chronic diastolic CHF - s/p right  heart cath and cardiac MRI- appreciate cardiology assistance - cont Torsemide daily, Tadalafil, Letairis, O2 and CPAP    Atrial flutter, paroxysmal and h/o PE - s/p DCCV in 11/21 - cont Coumadin- INR 1.6 today  DM2- with hypoglycemia while NPO - sugars stable now  CKD 4 - GFR 25     Discharge Exam: Vitals:   08/09/20 1044 08/09/20 1159  BP:  127/82  Pulse:  73  Resp:  18  Temp:  97.9 F (36.6 C)  SpO2: 99% 100%   Vitals:   08/09/20 0531 08/09/20 0825 08/09/20 1044 08/09/20 1159  BP: (!) 134/51 (!) 111/46  127/82  Pulse: 72 72  73  Resp: 20   18  Temp: 98.1 F (36.7 C)   97.9 F (36.6 C)  TempSrc: Oral   Oral  SpO2: 99% 95% 99% 100%  Weight:      Height:        General: Pt is alert, awake, not in acute distress Cardiovascular: RRR, S1/S2 +, no rubs, no gallops Respiratory: CTA bilaterally, no wheezing, no rhonchi Abdominal: Soft, NT, ND, bowel sounds + Extremities: no edema, no cyanosis   Discharge Instructions  Discharge Instructions    Diet - low sodium heart healthy   Complete by: As directed    Increase activity slowly   Complete by: As directed      Allergies as of 08/09/2020      Reactions   Penicillins Swelling   Facial swelling Did it involve swelling of the face/tongue/throat, SOB, or low BP? Yes Did it involve sudden or severe rash/hives, skin peeling,  or any reaction on the inside of your mouth or nose? No Did you need to seek medical attention at a hospital or doctor's office? No When did it last happen?10 + years If all above answers are "NO", may proceed with cephalosporin use.   Sulfa Antibiotics Rash      Medication List    STOP taking these medications   clindamycin 300 MG capsule Commonly known as: CLEOCIN   metoprolol tartrate 25 MG tablet Commonly known as: LOPRESSOR     TAKE these medications   acetaminophen 650 MG CR tablet Commonly known as: TYLENOL Take 650-1,300 mg by mouth every 8 (eight) hours as needed  for pain.   ambrisentan 5 MG tablet Commonly known as: LETAIRIS Take 5 mg by mouth at bedtime.   atorvastatin 80 MG tablet Commonly known as: LIPITOR TAKE 1 TABLET BY MOUTH DAILY What changed: when to take this   BD Pen Needle Nano U/F 32G X 4 MM Misc Generic drug: Insulin Pen Needle USE TO INJECT ONCE DAILY AS DIRECTED   docusate sodium 100 MG capsule Commonly known as: COLACE Take 100 mg by mouth daily as needed (constipation.).   empagliflozin 10 MG Tabs tablet Commonly known as: Jardiance Take 1 tablet (10 mg total) by mouth daily before breakfast.   FeroSul 325 (65 FE) MG tablet Generic drug: ferrous sulfate TAKE 1 TABLET BY MOUTH 3 TIMES DAILY WITH MEALS What changed: See the new instructions.   Januvia 25 MG tablet Generic drug: sitaGLIPtin TAKE ONE TABLET BY MOUTH EVERY DAY What changed: how much to take   losartan 25 MG tablet Commonly known as: Cozaar Take 1 tablet (25 mg total) by mouth daily.   omeprazole 40 MG capsule Commonly known as: PRILOSEC TAKE 1 CAPSULE BY MOUTH ONCE DAILY What changed: when to take this   OneTouch Ultra test strip Generic drug: glucose blood USE AS DIRECTED   tadalafil (PAH) 20 MG tablet Commonly known as: ADCIRCA Take 2 tablets (40 mg total) by mouth at bedtime.   torsemide 20 MG tablet Commonly known as: DEMADEX Take 1 tablet (20 mg total) by mouth daily.   Toujeo SoloStar 300 UNIT/ML Solostar Pen Generic drug: insulin glargine (1 Unit Dial) INJECT 50 UNITS INTO THE SKIN DAILY What changed: See the new instructions.   venlafaxine 75 MG tablet Commonly known as: EFFEXOR Take 75 mg by mouth daily.   vitamin B-12 1000 MCG tablet Commonly known as: CYANOCOBALAMIN Take 1,000 mcg by mouth in the morning and at bedtime.   Vitamin D3 50 MCG (2000 UT) Tabs Take 2,000 Units by mouth in the morning and at bedtime.   warfarin 4 MG tablet Commonly known as: Coumadin Take as directed. If you are unsure how to take this  medication, talk to your nurse or doctor. Original instructions: Take 4 mg on Sun, Mon, Tues, Thurs, Friday. What changed:   how much to take  how to take this  when to take this  additional instructions   warfarin 3 MG tablet Commonly known as: Coumadin Take as directed. If you are unsure how to take this medication, talk to your nurse or doctor. Original instructions: Take 3 mg on Wed and Saturday What changed:   how much to take  how to take this  when to take this  additional instructions       Follow-up Information    [facility] up on 08/30/2020.   Specialty: Cardiology Why: 9:00  AM [facility]  Contact information: [address] [id number] [place] [phone]             Allergies  Allergen Reactions  . Penicillins Swelling    Facial swelling Did it involve swelling of the face/tongue/throat, SOB, or low BP? Yes Did it involve sudden or severe rash/hives, skin peeling, or any reaction on the inside of your mouth or nose? No Did you need to seek medical attention at a hospital or doctor's office? No When did it last happen?10 + years If all above answers are "NO", may proceed with cephalosporin use.   . Sulfa Antibiotics Rash      CT HEAD WO CONTRAST  Result Date: 08/05/2020 CLINICAL DATA:  Head trauma. EXAM: CT HEAD WITHOUT CONTRAST TECHNIQUE: Contiguous axial images were obtained from the base of the skull through the vertex without intravenous contrast. COMPARISON:  Brain CT 04/09/2020. FINDINGS: Brain: Ventricles and sulci are appropriate for patient's age. No evidence for acute cortically based infarct, intracranial hemorrhage, mass lesion or mass-effect. Vascular: Unremarkable. Skull: Intact. Sinuses/Orbits: Paranasal sinuses well aerated. Mastoid air cells are unremarkable. Other: None. IMPRESSION: No acute intracranial  process. Electronically Signed   By: Lovey Newcomer M.D.   On: 08/05/2020 13:38   CARDIAC CATHETERIZATION  Result Date: 08/07/2020 Findings: RA = 11 RV = 91/14 PA = 93/20 (47) PCW = 21 Fick cardiac output/index = 8.0/3.7 PVR = 3.1 WU FA sat = 99% PA sat = 72%, 73% High SVC = 72% PAPi = 6.6 Assessment: 1. Moderate to severe PAH with high PA pulse pressure and high cardiac output 2. Moderately elevated left-sided pressures Plan/Discussion: PA pressures are very high bur PA pulsativity and cardiac output much higher than I would suspect in true PAH with pressures of this magnitude. ? possibility of shunt physiology. Will check cMRI. Glori Bickers, MD 9:07 AM    MR ANGIO CHEST W WO CONTRAST  Result Date: 08/08/2020 CLINICAL DATA:  Pulmonary hypertension. EXAM: MRA CHEST WITH OR WITHOUT CONTRAST TECHNIQUE: Angiographic images of the chest were obtained using MRA technique with without intravenous contrast. The coronary and cardiac components were interpreted by Cardiology. CONTRAST:  76m GADAVIST GADOBUTROL 1 MMOL/ML IV SOLN COMPARISON:  CT 01/11/2020 FINDINGS: Aorta: No aneurysm, dissection, or stenosis. Classic 3 vessel brachiocephalic arterial origin anatomy without proximal stenosis. Visualized proximal abdominal aorta unremarkable. Heart: Heart size normal. No pericardial effusion. Mediastinum/Nodes: No mass or adenopathy. Lungs/Pleura: No pleural effusion.  No pulmonary mass evident. Upper Abdomen: No acute findings. Musculoskeletal: Right breast prosthesis. IMPRESSION: Negative. Electronically Signed   By: DLucrezia EuropeM.D.   On: 08/08/2020 11:25   MR CARDIAC MORPHOLOGY W WO CONTRAST  Result Date: 08/07/2020 CLINICAL DATA:  Evaluate for shunt EXAM: CARDIAC MRI TECHNIQUE: The patient was scanned on a 1.5 Tesla Siemens magnet. A dedicated cardiac coil was used. Functional imaging was done using Fiesta sequences. 2,3, and 4 chamber views were done to assess for RWMA's. Modified Simpson's rule using a  short axis stack was used to calculate an ejection fraction on a dedicated work sConservation officer, nature The patient received 10 cc of Gadavist. After 10 minutes inversion recovery sequences were used to assess for infiltration and scar tissue. CONTRAST:  10 cc  of Gadavist FINDINGS: Left ventricle: -Normal size -Normal systolic function -Elevated ECV (34%) -RV insertion site LGE LV EF:  51% (Normal 56-78%) Absolute volumes: LV EDV: 2126m(Normal 52-141 mL) LV ESV: 10565mNormal 13-51  mL) LV SV: 153m (Normal 33-97 mL) CO: 6.8L/min (Normal 2.7-6.0 L/min) Indexed volumes: LV EDV: 971msq-m (Normal 41-81 mL/sq-m) LV ESV: 4635mq-m (Normal 12-21 mL/sq-m) LV SV: 40m28m-m (Normal 26-56 mL/sq-m) CI: 3.0L/min/sq-m (Normal 1.8-3.8 L/min/sq-m) Right ventricle: Mild dilatation with normal systolic function RV EF: 48% 28%rmal 47-80%) Absolute volumes: RV EDV: 220mL63mrmal 58-154 mL) RV ESV: 114mL 53mmal 12-68 mL) RV SV: 106mL (18mal 35-98 mL) CO: 6.6L/min (Normal 2.7-6 L/min) Indexed volumes: RV EDV: 97mL/sq60mNormal 48-87 mL/sq-m) RV ESV: 50mL/sq-29mormal 11-28 mL/sq-m) RV SV: 47mL/sq-m2mrmal 27-57 mL/sq-m) CI: 2.9L/min/sq-m (Normal 1.8-3.8 L/min/sq-m) Left atrium: Moderate enlargement Right atrium: Moderate enlargement Mitral valve: Mild regurgitation (regurgitant fraction 18%) Aortic valve: Mild regurgitation (regurgitant fraction 15%) Tricuspid valve: Mild regurgitation (regurgitant fraction 12%) Pulmonic valve: Moderate regurgitation (regurgitant fraction 23%) Pulmonary artery: Markedly dilated, measuring 52mm x 21m33mrta:38mmal proximal ascending aorta Pericardium: Small effusion IMPRESSION: 1. Motion artifact throughout exam affects quantification of volumes 2.  No evidence of shunt with Qp/Qs = 0.94 3.  Normal LV size and systolic function (EF 51%) 4.  Mil36%V dilatation with normal systolic function (EF 48%) 5.  Mod62%te pulmonary regurgitation (regurgitant fraction 23%) 6. RV insertion site late  gadolinium enhancement, which is a nonspecific finding often seen in setting of pulmonary hypertension 7.  Markedly dilated main pulmonary artery, measuring 52mm x 21mm 19mild 22mal regurgitation (regurgitant fraction 18%), mild aortic regurgitation (regurgitant fraction 15%), and mild tricuspid regurgitation (regurgitant fraction 12%) Electronically Signed   By: Christopher  SOswaldo Milian1/2021 22:50   ECHOCARDIOGRAM COMPLETE  Result Date: 08/06/2020    ECHOCARDIOGRAM REPORT   Patient Name:   Anjeanette Cowley SHAWNIECE OYOLA 08/06/2020 Medical Rec #:  [id number]     [id number]     65.0 in Accession #:    [id number]    [id number]    247.4 lb Date of Birth:  [birth year]     2.165 m Patient Age:    79 years      34:           153/55 mmHg Patient Gender: F             HR:           63 bpm. Exam Location:  Inpatient Procedure: 2D Echo Indications:    pulmonary hypertension 416.8  History:        Patient has prior history of Echocardiogram examinations, most                 recent 03/24/2020. CHF, chronic kidney disease, Arrythmias:Atrial                 Fibrillation, Signs/Symptoms:Syncope; Risk Factors:Sleep Apnea,                 Diabetes, Hypertension and Dyslipidemia.  Sonographer:    Lauren PenningJohny Chesss: [id number] [phone] ejection fraction, by estimation, is 60 to 65%. The left ventricle has normal function. The left ventricle has no regional wall motion abnormalities. There is mild left ventricular hypertrophy. Left ventricular diastolic parameters are consistent with Grade II diastolic dysfunction (pseudonormalization). Elevated left ventricular end-diastolic pressure.  2. Right ventricular systolic function is normal. The right ventricular size is normal. Tricuspid regurgitation signal is inadequate for assessing PA pressure.  3. Left atrial size was moderately dilated.  4. Moderate mitral subvalvular calcification.  5. The  mitral valve is  abnormal. Mild mitral valve regurgitation.  6. The aortic valve is tricuspid. Aortic valve regurgitation is not visualized. Mild aortic valve sclerosis is present, with no evidence of aortic valve stenosis. Aortic valve mean gradient measures 10.0 mmHg.  7. Aortic dilatation noted. There is mild dilatation of the ascending aorta, measuring 39 mm.  8. The inferior vena cava is dilated in size with <50% respiratory variability, suggesting right atrial pressure of 15 mmHg. Comparison(s): Changes from prior study are noted. 03/24/2020: LVDF 55-60%, moderate LVH, moderate RV systolic dysfunction, RVSP 40.5 mmHg, severe LAE. FINDINGS  Left Ventricle: Left ventricular ejection fraction, by estimation, is 60 to 65%. The left ventricle has normal function. The left ventricle has no regional wall motion abnormalities. The left ventricular internal cavity size was normal in size. There is  mild left ventricular hypertrophy. Left ventricular diastolic parameters are consistent with Grade II diastolic dysfunction (pseudonormalization). Elevated left ventricular end-diastolic pressure. Right Ventricle: The right ventricular size is normal. No increase in right ventricular wall thickness. Right ventricular systolic function is normal. Tricuspid regurgitation signal is inadequate for assessing PA pressure. Left Atrium: Left atrial size was moderately dilated. Right Atrium: Right atrial size was normal in size. Pericardium: There is no evidence of pericardial effusion. Mitral Valve: The mitral valve is abnormal. Mild mitral annular calcification. Moderate mitral subvalvular calcification. Mild mitral valve regurgitation. Tricuspid Valve: The tricuspid valve is grossly normal. Tricuspid valve regurgitation is trivial. Aortic Valve: The aortic valve is tricuspid. Aortic valve regurgitation is not visualized. Mild aortic valve sclerosis is present, with no evidence of aortic valve stenosis. Aortic valve mean gradient measures 10.0 mmHg.  Aortic valve peak gradient measures 17.3 mmHg. Aortic valve area, by VTI measures 1.59 cm. Pulmonic Valve: The pulmonic valve was grossly normal. Pulmonic valve regurgitation is trivial. Aorta: Aortic dilatation noted. There is mild dilatation of the ascending aorta, measuring 39 mm. Venous: The inferior vena cava is dilated in size with less than 50% respiratory variability, suggesting right atrial pressure of 15 mmHg. IAS/Shunts: No atrial level shunt detected by color flow Doppler.  LEFT VENTRICLE PLAX 2D LVIDd:         5.00 cm  Diastology LVIDs:         3.60 cm  LV e' medial:    4.13 cm/s LV PW:         1.30 cm  LV E/e' medial:  35.8 LV IVS:        1.10 cm  LV e' lateral:   7.18 cm/s LVOT diam:     1.90 cm  LV E/e' lateral: 20.6 LV SV:         94 LV SV Index:   43 LVOT Area:     2.84 cm  RIGHT VENTRICLE             IVC RV S prime:     13.30 cm/s  IVC diam: 2.40 cm TAPSE (M-mode): 2.1 cm LEFT ATRIUM             Index       RIGHT ATRIUM           Index LA diam:        4.30 cm 1.99 cm/m  RA Area:     19.90 cm LA Vol (A2C):   86.2 ml 39.81 ml/m RA Volume:   52.40 ml  24.20 ml/m LA Vol (A4C):   65.8 ml 30.39 ml/m LA Biplane Vol: 78.9 ml 36.44 ml/m  AORTIC VALVE AV  Area (Vmax):    1.53 cm AV Area (Vmean):   1.52 cm AV Area (VTI):     1.59 cm AV Vmax:           208.00 cm/s AV Vmean:          151.000 cm/s AV VTI:            0.591 m AV Peak Grad:      17.3 mmHg AV Mean Grad:      10.0 mmHg LVOT Vmax:         112.50 cm/s LVOT Vmean:        81.100 cm/s LVOT VTI:          0.332 m LVOT/AV VTI ratio: 0.56  AORTA Ao Root diam: 3.50 cm Ao Asc diam:  3.90 cm MITRAL VALVE MV Area (PHT): 1.82 cm     SHUNTS MV Decel Time: 417 msec     Systemic VTI:  0.33 m MV E velocity: 148.00 cm/s  Systemic Diam: 1.90 cm MV A velocity: 99.10 cm/s MV E/A ratio:  1.49 Lyman Bishop MD Electronically signed by Lyman Bishop MD Signature Date/Time: 08/06/2020/7:51:30 PM    Final      The results of significant diagnostics from this  hospitalization (including imaging, microbiology, ancillary and laboratory) are listed below for reference.     Microbiology: Recent Results (from the past 240 hour(s))  Resp Panel by RT-PCR (Flu A&B, Covid) Nasopharyngeal Swab     Status: None   Collection Time: 08/05/20  5:52 PM   Specimen: Nasopharyngeal Swab; Nasopharyngeal(NP) swabs in vial transport medium  Result Value Ref Range Status   SARS Coronavirus 2 by RT PCR NEGATIVE NEGATIVE Final    Comment: (NOTE) SARS-CoV-2 target nucleic acids are NOT DETECTED.  The SARS-CoV-2 RNA is generally detectable in upper respiratory specimens during the acute phase of infection. The lowest concentration of SARS-CoV-2 viral copies this assay can detect is 138 copies/mL. A negative result does not preclude SARS-Cov-2 infection and should not be used as the sole basis for treatment or other patient management decisions. A negative result may occur with  improper specimen collection/handling, submission of specimen other than nasopharyngeal swab, presence of viral mutation(s) within the areas targeted by this assay, and inadequate number of viral copies(<138 copies/mL). A negative result must be combined with clinical observations, patient history, and epidemiological information. The expected result is Negative.  Fact Sheet for Patients:  EntrepreneurPulse.com.au  Fact Sheet for Healthcare Providers:  IncredibleEmployment.be  This test is no t yet approved or cleared by the [place] FDA and  has been authorized for detection and/or diagnosis of SARS-CoV-2 by FDA under an Emergency Use Authorization (EUA). This EUA will remain  in effect (meaning this test can be used) for the duration of the COVID-19 declaration under Section 564(b)(1) of the Act, 21 U.S.C.section 360bbb-3(b)(1), unless the authorization is terminated  or revoked sooner.       Influenza A by PCR NEGATIVE NEGATIVE Final    Influenza B by PCR NEGATIVE NEGATIVE Final    Comment: (NOTE) The Xpert Xpress SARS-CoV-2/FLU/RSV plus assay is intended as an aid in the diagnosis of influenza from Nasopharyngeal swab specimens and should not be used as a sole basis for treatment. Nasal washings and aspirates are unacceptable for Xpert Xpress SARS-CoV-2/FLU/RSV testing.  Fact Sheet for Patients: EntrepreneurPulse.com.au  Fact Sheet for Healthcare Providers: IncredibleEmployment.be  This test is not yet approved or cleared by the [place] and has been authorized for  detection and/or diagnosis of SARS-CoV-2 by FDA under an Emergency Use Authorization (EUA). This EUA will remain in effect (meaning this test can be used) for the duration of the COVID-19 declaration under Section 564(b)(1) of the Act, 21 U.S.C. section 360bbb-3(b)(1), unless the authorization is terminated or revoked.  Performed at [facility], [place] [address] [place], [place] [address]      Labs: BNP (last 3 results) Recent Labs    01/27/20 1150  BNP 774.1*   Basic Metabolic Panel: Recent Labs  Lab 08/05/20 1305 08/06/20 0315 08/07/20 0317 08/07/20 0826 08/07/20 0827 08/07/20 0831 08/08/20 1202  NA 141 141 141 146* 144 144 140  K 4.8 4.5 4.4 3.9 4.1 4.1 4.5  CL 105 106 106  --   --   --  102  CO2 _0 --   --   --  27  GLUCOSE 146* 170* 88  --   --   --  130*  BUN 43* 41* 53*  --   --   --  49*  CREATININE 1.77* 1.66* 1.97*  --   --   --  1.97*  CALCIUM 9.0 9.0 9.2  --   --   --  8.9   Liver Function Tests: No results for input(s): AST, ALT, ALKPHOS, BILITOT, PROT, ALBUMIN in the last 168 hours. No results for input(s): LIPASE, AMYLASE in the last 168 hours. No results for input(s): AMMONIA in the last 168 hours. CBC: Recent Labs  Lab 08/05/20 1305 08/07/20 0317 08/07/20 0826 08/07/20 0827 08/07/20 0831 08/08/20 1202 08/09/20 0317  WBC 6.3 8.7  --   --   --  6.9  6.5  HGB 9.7* 9.8* 9.9* 10.5* 9.9* 10.8* 10.5*  HCT 33.4* 31.6* 29.0* 31.0* 29.0* 36.2 35.1*  MCV 100.3* 96.9  --   --   --  98.1 97.5  PLT 233 251  --   --   --  278 242   Cardiac Enzymes: No results for input(s): CKTOTAL, CKMB, CKMBINDEX, TROPONINI in the last 168 hours. BNP: Invalid input(s): POCBNP CBG: Recent Labs  Lab 08/08/20 1135 08/08/20 1622 08/08/20 2113 08/09/20 0647 08/09/20 1149  GLUCAP 132* 122* 179* 169* 204*   D-Dimer No results for input(s): DDIMER in the last 72 hours. Hgb A1c No results for input(s): HGBA1C in the last 72 hours. Lipid Profile No results for input(s): CHOL, HDL, LDLCALC, TRIG, CHOLHDL, LDLDIRECT in the last 72 hours. Thyroid function studies No results for input(s): TSH, T4TOTAL, T3FREE, THYROIDAB in the last 72 hours.  Invalid input(s): FREET3 Anemia work up No results for input(s): VITAMINB12, FOLATE, FERRITIN, TIBC, IRON, RETICCTPCT in the last 72 hours. Urinalysis    Component Value Date/Time   COLORURINE YELLOW 08/05/2020 1718   APPEARANCEUR CLEAR 08/05/2020 1718   LABSPEC 1.008 08/05/2020 1718   PHURINE 5.0 08/05/2020 1718   GLUCOSEU 150 (A) 08/05/2020 1718   HGBUR SMALL (A) 08/05/2020 1718   BILIRUBINUR NEGATIVE 08/05/2020 1718   KETONESUR NEGATIVE 08/05/2020 1718   PROTEINUR NEGATIVE 08/05/2020 1718   NITRITE POSITIVE (A) 08/05/2020 1718   LEUKOCYTESUR TRACE (A) 08/05/2020 1718   Sepsis Labs Invalid input(s): PROCALCITONIN,  WBC,  LACTICIDVEN Microbiology Recent Results (from the past 240 hour(s))  Resp Panel by RT-PCR (Flu A&B, Covid) Nasopharyngeal Swab     Status: None   Collection Time: 08/05/20  5:52 PM   Specimen: Nasopharyngeal Swab; Nasopharyngeal(NP) swabs in vial transport medium  Result Value Ref Range Status  SARS Coronavirus 2 by RT PCR NEGATIVE NEGATIVE Final    Comment: (NOTE) SARS-CoV-2 target nucleic acids are NOT DETECTED.  The SARS-CoV-2 RNA is generally detectable in upper  respiratory specimens during the acute phase of infection. The lowest concentration of SARS-CoV-2 viral copies this assay can detect is 138 copies/mL. A negative result does not preclude SARS-Cov-2 infection and should not be used as the sole basis for treatment or other patient management decisions. A negative result may occur with  improper specimen collection/handling, submission of specimen other than nasopharyngeal swab, presence of viral mutation(s) within the areas targeted by this assay, and inadequate number of viral copies(<138 copies/mL). A negative result must be combined with clinical observations, patient history, and epidemiological information. The expected result is Negative.  Fact Sheet for Patients:  EntrepreneurPulse.com.au  Fact Sheet for Healthcare Providers:  IncredibleEmployment.be  This test is no t yet approved or cleared by the [place] FDA and  has been authorized for detection and/or diagnosis of SARS-CoV-2 by FDA under an Emergency Use Authorization (EUA). This EUA will remain  in effect (meaning this test can be used) for the duration of the COVID-19 declaration under Section 564(b)(1) of the Act, 21 U.S.C.section 360bbb-3(b)(1), unless the authorization is terminated  or revoked sooner.       Influenza A by PCR NEGATIVE NEGATIVE Final   Influenza B by PCR NEGATIVE NEGATIVE Final    Comment: (NOTE) The Xpert Xpress SARS-CoV-2/FLU/RSV plus assay is intended as an aid in the diagnosis of influenza from Nasopharyngeal swab specimens and should not be used as a sole basis for treatment. Nasal washings and aspirates are unacceptable for Xpert Xpress SARS-CoV-2/FLU/RSV testing.  Fact Sheet for Patients: EntrepreneurPulse.com.au  Fact Sheet for Healthcare Providers: IncredibleEmployment.be  This test is not yet approved or cleared by the [place] FDA and has been  authorized for detection and/or diagnosis of SARS-CoV-2 by FDA under an Emergency Use Authorization (EUA). This EUA will remain in effect (meaning this test can be used) for the duration of the COVID-19 declaration under Section 564(b)(1) of the Act, 21 U.S.C. section 360bbb-3(b)(1), unless the authorization is terminated or revoked.  Performed at [facility], [place] [address], [place], [place] [address]      Time coordinating discharge in minutes: 65  SIGNED:   Debbe Odea, MD  [facility] 08/09/2020, 1:13 PM

## 2020-08-09 NOTE — Progress Notes (Signed)
Pharmacy Consult for Pulmonary Hypertension Treatment   Indication - Continuation of prior to admission medication   Patient is 79 y.o.  with history of PAH on chronic Ambrisentan (Letairis) PTA and will be continued while hospitalized.   Continuing this medication order as an inpatient requires that monitoring parameters per REMS requirements must be met.  1. Chronic therapy is under the supervision of Dr Kurian who is enrolled in the REMS program and is being notified of continuation of therapy. A staff message in EPIC has been sent notifying the certified prescriber.  2. Per patient report has previously been educated on Hepatotoxicity . On admission pregnancy risk has been assessed and no monitoring required. 3. Hepatic function has been evaluated. AST/ALT appropriate to continue medication at this time.  Hepatic Function Latest Ref Rng & Units 07/24/2020 01/27/2020 01/18/2020  Total Protein 6.0 - 8.5 g/dL 5.8(L) 6.0(L) 6.1  Albumin 3.7 - 4.7 g/dL 3.8 3.5 4.0  AST 0 - 40 IU/L 8 16 11  ALT 0 - 32 IU/L 8 12 9  Alk Phosphatase 44 - 121 IU/L 79 56 77  Total Bilirubin 0.0 - 1.2 mg/dL 0.2 0.5 0.3    If any question arise or pregnancy is identified during hospitalization, contact for bosentan and macitentan: [phone]; ambrisentan: [phone].  Thank for you allowing us to participate in the care of this patient.   , PharmD, BCCCP Clinical Pharmacist  Phone: [phone] 08/09/2020 10:13 AM  Please check AMION for all [facility] Pharmacy phone numbers After 10:00 PM, call Main Pharmacy [phone]  

## 2020-08-09 NOTE — Progress Notes (Addendum)
[place] for warfarin Indication: atrial fibrillation  Allergies  Allergen Reactions  . Penicillins Swelling    Facial swelling Did it involve swelling of the face/tongue/throat, SOB, or low BP? Yes Did it involve sudden or severe rash/hives, skin peeling, or any reaction on the inside of your mouth or nose? No Did you need to seek medical attention at a hospital or doctor's office? No When did it last happen?10 + years If all above answers are "NO", may proceed with cephalosporin use.   . Sulfa Antibiotics Rash    Patient Measurements: Height: _0  (165.1 cm) Weight: 109.9 kg (242 lb 4.6 oz) IBW/kg (Calculated) : 57   Vital Signs: Temp: 98.1 F (36.7 C) (12/23 0531) Temp Source: Oral (12/23 0531) BP: 111/46 (12/23 0825) Pulse Rate: 72 (12/23 0825)  Labs: Recent Labs    08/07/20 0316 08/07/20 0317 08/07/20 0826 08/07/20 0831 08/08/20 0304 08/08/20 1202 08/09/20 0317  HGB  --  9.8*   < > 9.9*  --  10.8* 10.5*  HCT  --  31.6*   < > 29.0*  --  36.2 35.1*  PLT  --  251  --   --   --  278 242  LABPROT 25.2*  --   --   --  18.8*  --  16.8*  INR 2.4*  --   --   --  1.6*  --  1.4*  CREATININE  --  1.97*  --   --   --  1.97*  --    < > = values in this interval not displayed.    Estimated Creatinine Clearance: 28.6 mL/min (A) (by C-G formula based on SCr of 1.97 mg/dL (H)).   Medical History: Past Medical History:  Diagnosis Date  . A-fib ([place])   . Anemia   . CHF (congestive heart failure) ([place])   . Chronic kidney disease   . Diabetes mellitus without complication ([place])   . Hypertension   . Pulmonary hypertension ([place])   . Syncope 07/2020    Medications:  Scheduled:  . ambrisentan  5 mg Oral QHS  . atorvastatin  80 mg Oral QHS  . cholecalciferol  2,000 Units Oral Daily  . ferrous sulfate  325 mg Oral TID WC  . hydrALAZINE  25 mg Oral BID  . insulin aspart  0-5 Units Subcutaneous QHS  . insulin aspart  0-9  Units Subcutaneous TID WC  . linagliptin  5 mg Oral Daily  . losartan  25 mg Oral Daily  . pantoprazole  40 mg Oral Daily  . tadalafil  40 mg Oral QHS  . torsemide  20 mg Oral Daily  . venlafaxine XR  75 mg Oral Q breakfast  . vitamin B-12  1,000 mcg Oral Daily  . Warfarin - Pharmacist Dosing Inpatient   Does not apply q1600    Assessment:  79 yo female on chronic Coumadin for afib, which was held in anticipation of heart cath performed 12/21.  Pharmacy asked to resume Coumadin today.  Coumadin dose prior to admission was 4 mg daily except 3 mg on Wed and Sat.  INR down to 1.4 today after holding Coumadin for several days. Warfarin restarted at 5 mg last night. No overt bleeding or complications noted.  Discussed with Dr. [name], he does not feel she needs to be bridged with IV heparin.   DCCV was 4 weeks ago. Hgb 10.5, plt 242.   Goal of Therapy:  INR 2-3 Monitor platelets  by anticoagulation protocol: Yes   Plan:  Coumadin 5 mg po x 1 tonight. Recommend resuming previous home dose of Coumadin at discharge. Daily INR.  Antonietta Jewel, PharmD, [place] Clinical Pharmacist  Phone: [phone] 08/09/2020 9:58 AM  Please check AMION for all [place] phone numbers After 10:00 PM, call [facility] [phone]

## 2020-08-09 NOTE — Progress Notes (Signed)
IV and teli d/c. AVS given with teach back. Daughter on the way with oxygen. Awaiting on the ride.

## 2020-08-09 NOTE — Progress Notes (Signed)
ZioAT patched placed on patient. Patient educated on use of button if symptoms of lightheadedness or palpitations occur. Educated to keep gateway device within 10 feet of person to transmit data and how to return device via mail. Pt states she understands how device works, troubleshooting and to remove and return after 14 days. Pt understands restrictions of bathing in tub and keeping out of direct stream of shower. Planned DC today, bedside RN notified of device placement.  Pricilla Holm, RN HF RN Navigator

## 2020-08-09 NOTE — Progress Notes (Addendum)
Advanced Heart Failure Rounding Note  PCP-Cardiologist: No primary care provider on file.   Subjective:    Today, denies chest pain, shortness of breath, lightheadedness and dizziness.  She is anxious to get back home.  Currently, on 3L of oxygen via n/c which is her baseline.     2D Echo  EF 60-65% RV normal   RHC Findings:   RA = 11 RV = 91/14 PA = 93/20 (47) PCW = 21 Fick cardiac output/index = 8.0/3.7 PVR = 3.1 WU FA sat = 99% PA sat = 72%, 73% High SVC = 72% PAPi = 6.6    Assessment: 1. Moderate to severe PAH with high PA pulse pressure and high cardiac output 2. Moderately elevated left-sided pressures   cMRI  1. Motion artifact throughout exam affects quantification of volumes   2.  No evidence of shunt with Qp/Qs = 0.94   3.  Normal LV size and systolic function (EF 93%)   4.  Mild RV dilatation with normal systolic function (EF 81%)   5.  Moderate pulmonary regurgitation (regurgitant fraction 23%)   6. RV insertion site late gadolinium enhancement, which is a nonspecific finding often seen in setting of pulmonary hypertension   7.  Markedly dilated main pulmonary artery, measuring 23m x 480m  8. Mild mitral regurgitation (regurgitant fraction 18%), mild aortic regurgitation (regurgitant fraction 15%), and mild tricuspid regurgitation (regurgitant fraction 12%)    Objective:   Weight Range: 109.9 kg Body mass index is 40.32 kg/m.   Vital Signs:   Temp:  [98.1 F (36.7 C)-98.6 F (37 C)] 98.1 F (36.7 C) (12/23 0531) Pulse Rate:  [66-76] 72 (12/23 0825) Resp:  [18-21] 20 (12/23 0531) BP: (111-149)/(46-55) 111/46 (12/23 0825) SpO2:  [95 %-100 %] 95 % (12/23 0825) Last BM Date: 08/08/20  Weight change: Filed Weights   08/06/20 1413 08/07/20 0513 08/08/20 0430  Weight: 112.2 kg 111.8 kg 109.9 kg    Intake/Output:   Intake/Output Summary (Last 24 hours) at 08/09/2020 0940 Last data filed at 08/09/2020 0824 Gross per 24 hour   Intake 840 ml  Output 2450 ml  Net -1610 ml      Physical Exam    General:  Well appearing. No resp difficulty HEENT: normal Neck: supple. + JVD.  Cor: PMI nondisplaced. Regular rate & rhythm.  Lungs: clear with mild wheeze to left anterior chest.  Abdomen: soft, nontender, nondistended. Good bowel sounds. Extremities: no cyanosis, clubbing, rash, edema Neuro: alert & oriented x 3, cranial nerves grossly intact. moves all 4 extremities w/o difficulty. Affect pleasant  Telemetry   SR with PAC, rates 70-80s.  Personally reviewed.   EKG    No new EKG to review.  Labs    CBC Recent Labs    08/08/20 1202 08/09/20 0317  WBC 6.9 6.5  HGB 10.8* 10.5*  HCT 36.2 35.1*  MCV 98.1 97.5  PLT 278 24829 Basic Metabolic Panel Recent Labs    08/07/20 0317 08/07/20 0826 08/07/20 0831 08/08/20 1202  NA 141   < > 144 140  K 4.4   < > 4.1 4.5  CL 106  --   --  102  CO2 24  --   --  27  GLUCOSE 88  --   --  130*  BUN 53*  --   --  49*  CREATININE 1.97*  --   --  1.97*  CALCIUM 9.2  --   --  8.9   < > =  values in this interval not displayed.   Liver Function Tests No results for input(s): AST, ALT, ALKPHOS, BILITOT, PROT, ALBUMIN in the last 72 hours. No results for input(s): LIPASE, AMYLASE in the last 72 hours. Cardiac Enzymes No results for input(s): CKTOTAL, CKMB, CKMBINDEX, TROPONINI in the last 72 hours.  BNP: BNP (last 3 results) Recent Labs    01/27/20 1150  BNP 450.6*    ProBNP (last 3 results) No results for input(s): PROBNP in the last 8760 hours.   D-Dimer No results for input(s): DDIMER in the last 72 hours. Hemoglobin A1C No results for input(s): HGBA1C in the last 72 hours. Fasting Lipid Panel No results for input(s): CHOL, HDL, LDLCALC, TRIG, CHOLHDL, LDLDIRECT in the last 72 hours. Thyroid Function Tests No results for input(s): TSH, T4TOTAL, T3FREE, THYROIDAB in the last 72 hours.  Invalid input(s): FREET3  Other results:   Imaging     . No results found.   Medications:     Scheduled Medications: . . . ambrisentan .  5 mg . Oral . QHS  . [name] [name] atorvastatin .  80 mg . Oral . QHS  . [name] . cholecalciferol .  2,000 Units . Oral . Daily  . . . ferrous sulfate .  325 mg . Oral . TID WC  . [name] . hydrALAZINE .  25 mg . Oral . BID  . [name] . insulin aspart .  0-5 Units . Subcutaneous . QHS  . [name] . insulin aspart .  0-9 Units . Subcutaneous . TID WC  . [name] . linagliptin .  5 mg . Oral . Daily  . . . losartan .  25 mg . Oral . Daily  . . . pantoprazole .  40 mg . Oral . Daily  . . . tadalafil .  40 mg . Oral . QHS  . [name] . torsemide .  20 mg . Oral . Daily  . . . venlafaxine XR .  75 mg . Oral . Q breakfast  . . . vitamin B-12 .  1,000 mcg . Oral . Daily  . . . Warfarin - Pharmacist Dosing Inpatient .   [name] Does not apply . q1600  .   Infusions: .    PRN Medications: . acetaminophen **OR** acetaminophen, albuterol, diphenhydrAMINE, docusate sodium, hydrALAZINE    Assessment/Plan    1. Recurrent Syncope - Given severity of PAH, suspicion has been that this was related to [facility] but cardiac outputs and PA pulsativity argue against this somewhat unless preload dropped for some reason. She was bradycardic on arrival which may also be contributing factor.  - No events on tele.  - AT Zio monitor at d/c   2. Pulmonary HTN (severe) with cor pulmonale - Suspect this is really WHO Group 3 disease due to OSA/OHS but given markedly elevated PVR may also have component of WHO Group I & IV - VQ 07/07/19: No PE - RV function normal on echo - PFTs with significant restrictive lung physiology - PA pressures are very high but PA pulsativity and cardiac output much higher than would be suspected in true PAH with pressures of this magnitude.  -cMRI showed no evidence of shunt, Qp/Qs=0.94 - Continue O2 supplementation and CPAP - Continue tadalafil 40 and letairis 5 (unable to tolerate higher dose of letairis).  - With low PVR would not add  selxipag at this point   3. Chronic diastolic HF - Plan as above - volume status ok  - continue  torsemide 20 daily    4. CKD 4 - Yesterday creatinine 1.97 - C/w Jardiance   5. Nonobstructive CAD - No s/s angina - Continue statin.  - Off ASA with coumadin   6. H/o PE 2009 - On warfarin. INR 1.4. Discussed plan with pharmacy and patient.  - VQ 11/20 negative - Does not want to switch to DOAC due to cost   7. Paroxysmal AFL - new diagnosis. Was in AFL in 6/21. ECG from 11/20 was NSR  - s/p DC-CV 07/10/20 - SR on tele today  - INR subtherapeutic at 1.4   Length of Stay: 2  Carlene Coria, NP  08/09/2020, 9:40 AM  Advanced Heart Failure Team Pager [phone] (M-F; [place])  Please contact [facility] for night-coverage after hours (4p -7a ) and weekends on [email or web address]  Patient seen and examined with the above-signed Advanced Practice Provider and/or Housestaff. I personally reviewed laboratory data, imaging studies and relevant notes. I independently examined the patient and formulated the important aspects of the plan. I have edited the note to reflect any of my changes or salient points. I have personally discussed the plan with the patient and/or family.  Feels ok this am. No CP or SOB. Rhythm stable. INR 1.4  Eager to go home.   General:  Elderly woman sitting up in bed No resp difficulty HEENT: normal Neck: supple. no JVD. Carotids 2+ bilat; no bruits. No lymphadenopathy or thryomegaly appreciated. Cor: PMI nondisplaced. Regular rate & rhythm. 2/6 TR Lungs: clear Abdomen: soft, nontender, nondistended. No hepatosplenomegaly. No bruits or masses. Good bowel sounds. Extremities: no cyanosis, clubbing, rash, edema Neuro: alert & orientedx3, cranial nerves grossly intact. moves all 4 extremities w/o difficulty. Affect pleasant  Ok for d/c today with Zio AT monitor (in place). Will follow closely in clinic. Agree withs topping b-blocker.   Glori Bickers, MD  1:03  PM

## 2020-08-16 ENCOUNTER — Telehealth (HOSPITAL_COMMUNITY): Payer: Self-pay | Admitting: *Deleted

## 2020-08-16 NOTE — Telephone Encounter (Signed)
Pt left a VM stating she had discussed having carotid dopplers with Dr.Bensimhon when she was admitted to the hospital but has not been scheduled for that appt. Pt asked if Dr.Bensimhon still wanted her to have this done. Pt aware that Dr.Bensimhon is out of the office for the week and I will contact her next week.   Routed to [place] for advice

## 2020-08-16 NOTE — Telephone Encounter (Signed)
Ok to schedule after d/c if not done while inpatient

## 2020-08-20 ENCOUNTER — Other Ambulatory Visit (HOSPITAL_COMMUNITY): Payer: Self-pay | Admitting: *Deleted

## 2020-08-20 DIAGNOSIS — I6529 Occlusion and stenosis of unspecified carotid artery: Secondary | ICD-10-CM

## 2020-08-21 ENCOUNTER — Encounter: Payer: Self-pay | Admitting: Physician Assistant

## 2020-08-21 ENCOUNTER — Other Ambulatory Visit: Payer: Self-pay

## 2020-08-21 ENCOUNTER — Ambulatory Visit (INDEPENDENT_AMBULATORY_CARE_PROVIDER_SITE_OTHER): Payer: Medicare Other | Admitting: Physician Assistant

## 2020-08-21 VITALS — BP 140/80 | HR 68 | Temp 98.0°F | Resp 20 | Ht 65.0 in | Wt 246.0 lb

## 2020-08-21 DIAGNOSIS — R55 Syncope and collapse: Secondary | ICD-10-CM | POA: Diagnosis not present

## 2020-08-21 DIAGNOSIS — I482 Chronic atrial fibrillation, unspecified: Secondary | ICD-10-CM | POA: Diagnosis not present

## 2020-08-21 DIAGNOSIS — Z7901 Long term (current) use of anticoagulants: Secondary | ICD-10-CM | POA: Diagnosis not present

## 2020-08-21 LAB — POCT INR
INR: 1.6 — AB (ref 2.0–3.0)
PT: 19.6

## 2020-08-21 NOTE — Progress Notes (Signed)
Established patient visit   Patient: Ann Flynn   DOB: Jun 17, 1941   80 y.o. Female  MRN: [id number] Visit Date: 08/21/2020  Today's healthcare provider: Trinna Post, PA-C   Chief Complaint  Patient presents with  . Hospitalization Follow-up   Subjective    HPI  Follow up Hospitalization  Patient was admitted to [facility] on 08/05/2020 and discharged on 08/09/2020. She was treated for syncope and fall. Treatment for this included stopping metoprolol 29m. Cardiac MRI, ECHO, and right heart catheterization were abnormal but stable.  Telephone follow up was done on 08/16/2020 She reports good compliance with treatment. She reports this condition is improved. She reports that she has not had any more episodes since she has been discharged.   Patient with history of pulmonary HTN, diastolic heart failure, CKD IV, DM presents today for hospital follow up.     Medications: Outpatient Medications Prior to Visit  Medication Sig  . acetaminophen (TYLENOL) 650 MG CR tablet Take 650-1,300 mg by mouth every 8 (eight) hours as needed for pain.  .Marland Kitchenambrisentan (LETAIRIS) 5 MG tablet Take 5 mg by mouth at bedtime.  .Marland Kitchenatorvastatin (LIPITOR) 80 MG tablet TAKE 1 TABLET BY MOUTH DAILY (Patient taking differently: Take 80 mg by mouth at bedtime.)  . BD PEN NEEDLE NANO U/F 32G X 4 MM MISC USE TO INJECT ONCE DAILY AS DIRECTED  . Cholecalciferol (VITAMIN D3) 50 MCG (2000 UT) TABS Take 2,000 Units by mouth in the morning and at bedtime.  . docusate sodium (COLACE) 100 MG capsule Take 100 mg by mouth daily as needed (constipation.).   .Marland KitchenFEROSUL 325 (65 Fe) MG tablet TAKE 1 TABLET BY MOUTH 3 TIMES DAILY WITH MEALS (Patient taking differently: Take 325 mg by mouth in the morning, at noon, and at bedtime.)  . losartan (COZAAR) 25 MG tablet Take 1 tablet (25 mg total) by mouth daily.  .Glory RosebushULTRA test strip USE AS DIRECTED  . tadalafil, PAH, (ADCIRCA) 20 MG tablet Take 2 tablets (40 mg  total) by mouth at bedtime.  . torsemide (DEMADEX) 20 MG tablet Take 1 tablet (20 mg total) by mouth daily.  .Marland Kitchenvenlafaxine (EFFEXOR) 75 MG tablet Take 75 mg by mouth daily.   . vitamin B-12 (CYANOCOBALAMIN) 1000 MCG tablet Take 1,000 mcg by mouth in the morning and at bedtime.  . [DISCONTINUED] empagliflozin (JARDIANCE) 10 MG TABS tablet Take 1 tablet (10 mg total) by mouth daily before breakfast.  . [DISCONTINUED] JANUVIA 25 MG tablet TAKE ONE TABLET BY MOUTH EVERY DAY (Patient taking differently: Take 25 mg by mouth daily.)  . [DISCONTINUED] omeprazole (PRILOSEC) 40 MG capsule TAKE 1 CAPSULE BY MOUTH ONCE DAILY (Patient taking differently: Take 40 mg by mouth at bedtime.)  . [DISCONTINUED] TOUJEO SOLOSTAR 300 UNIT/ML Solostar Pen INJECT 50 UNITS INTO THE SKIN DAILY (Patient taking differently: Inject 50 Units into the skin at bedtime.)  . [DISCONTINUED] warfarin (COUMADIN) 3 MG tablet Take 3 mg on Wed and Saturday (Patient taking differently: Take 3 mg by mouth 2 (two) times a week. Take 1 tablet (3 mg) by mouth on Wednesdays & Saturdays in the evening.)  . [DISCONTINUED] warfarin (COUMADIN) 4 MG tablet Take 4 mg on Sun, Mon, Tues, Thurs, Friday. (Patient taking differently: Take 4 mg by mouth See admin instructions. Take 1 tablet (4 mg) by mouth on Sundays, Mondays, Tuesdays, Thursdays & Fridays in the evening.)   No facility-administered medications prior to visit.  Review of Systems  Constitutional: Negative.   Respiratory: Negative.   Cardiovascular: Negative.   Musculoskeletal: Negative.   Neurological: Negative.       Objective    BP 140/80   Pulse 68   Temp 98 F (36.7 C)   Resp 20   Ht 5' 5" (1.651 m)   Wt 246 lb (111.6 kg)   BMI 40.94 kg/m    Physical Exam Constitutional:      Appearance: Normal appearance. She is ill-appearing.  Cardiovascular:     Rate and Rhythm: Normal rate and regular rhythm.     Heart sounds: Normal heart sounds.  Pulmonary:     Effort:  Pulmonary effort is normal. No respiratory distress.     Breath sounds: Normal breath sounds.     Comments: Chronic oxygen use, 3L continuously.  Neurological:     Mental Status: She is alert and oriented to person, place, and time. Mental status is at baseline.  Psychiatric:        Mood and Affect: Mood normal.        Behavior: Behavior normal.       Results for orders placed or performed in visit on 08/21/20  POCT INR  Result Value Ref Range   INR 1.6 (A) 2.0 - 3.0   PT 19.6     Assessment & Plan    1. Syncope, unspecified syncope type  - Ambulatory referral to Neurology  2. Atrial fibrillation, chronic (HCC)  Check INR today.  3. Chronic anticoagulation  - POCT INR   No follow-ups on file.      ITrinna Post, PA-C, have reviewed all documentation for this visit. The documentation on 08/29/20 for the exam, diagnosis, procedures, and orders are all accurate and complete.  The entirety of the information documented in the History of Present Illness, Review of Systems and Physical Exam were personally obtained by me. Portions of this information were initially documented by [facility] and reviewed by me for thoroughness and accuracy.     Paulene Floor  [facility] [phone] (phone) [phone] (fax)  [place]

## 2020-08-22 ENCOUNTER — Other Ambulatory Visit: Payer: Self-pay | Admitting: Physician Assistant

## 2020-08-22 ENCOUNTER — Other Ambulatory Visit: Payer: Medicare Other

## 2020-08-22 ENCOUNTER — Telehealth (HOSPITAL_COMMUNITY): Payer: Self-pay | Admitting: Vascular Surgery

## 2020-08-22 DIAGNOSIS — I482 Chronic atrial fibrillation, unspecified: Secondary | ICD-10-CM

## 2020-08-22 DIAGNOSIS — M7989 Other specified soft tissue disorders: Secondary | ICD-10-CM

## 2020-08-22 NOTE — Telephone Encounter (Signed)
Left pt message giving appt day and time for CV doppler , asked pt to call back to confirm appt

## 2020-08-22 NOTE — Telephone Encounter (Signed)
Pt called and asked to speak with Earnest Bailey or Clare Gandy / Pt stated she didn't receive 5 of her medications   empagliflozin (JARDIANCE) 10 MG TABS   omeprazole (PRILOSEC) 40 MG capsule  torsemide (DEMADEX) 20 MG tablet   warfarin (COUMADIN) 3 MG tablet   warfarin (COUMADIN) 4 MG tablet   Please advise

## 2020-08-24 ENCOUNTER — Telehealth: Payer: Self-pay

## 2020-08-24 NOTE — Telephone Encounter (Signed)
[facility] calling to request the meds as well. They added she needs JANUVIA 25 MG tablet TOUJEO SOLOSTAR 300 UNIT/ML Solostar Pen  [facility] - [place], [place] - [address]

## 2020-08-24 NOTE — Telephone Encounter (Signed)
Spoke to patient and she reports that she switched pharmacy. Patient was also advised of new pharmacist Alex. Do both warfarin need to be send onto pharmacy for patient?

## 2020-08-24 NOTE — Telephone Encounter (Signed)
[facility] called again at 12:48pm stating that she is out of her Coumadin's and need them sent today so they can get them out to her today. They also need all of the other medications as well that's listed. Please advise

## 2020-08-24 NOTE — Progress Notes (Addendum)
Chronic Care Management Pharmacy Assistant   Name: Ninamarie Keel  MRN: [id number] DOB: February 13, 1941  Reason for Encounter: Medication Review  PCP : Trinna Post, PA-C  Allergies:   Allergies  Allergen Reactions   Penicillins Swelling    Facial swelling Did it involve swelling of the face/tongue/throat, SOB, or low BP? Yes Did it involve sudden or severe rash/hives, skin peeling, or any reaction on the inside of your mouth or nose? No Did you need to seek medical attention at a hospital or doctor's office? No When did it last happen?      10 + years If all above answers are "NO", may proceed with cephalosporin use.    Sulfa Antibiotics Rash    Medications: Outpatient Encounter Medications as of 08/24/2020  Medication Sig Note   acetaminophen (TYLENOL) 650 MG CR tablet Take 650-1,300 mg by mouth every 8 (eight) hours as needed for pain.    ambrisentan (LETAIRIS) 5 MG tablet Take 5 mg by mouth at bedtime.    atorvastatin (LIPITOR) 80 MG tablet TAKE 1 TABLET BY MOUTH DAILY (Patient taking differently: Take 80 mg by mouth at bedtime.)    BD PEN NEEDLE NANO U/F 32G X 4 MM MISC USE TO INJECT ONCE DAILY AS DIRECTED    Cholecalciferol (VITAMIN D3) 50 MCG (2000 UT) TABS Take 2,000 Units by mouth in the morning and at bedtime.    docusate sodium (COLACE) 100 MG capsule Take 100 mg by mouth daily as needed (constipation.).     empagliflozin (JARDIANCE) 10 MG TABS tablet Take 1 tablet (10 mg total) by mouth daily before breakfast. 08/05/2020: ---   FEROSUL 325 (65 Fe) MG tablet TAKE 1 TABLET BY MOUTH 3 TIMES DAILY WITH MEALS (Patient taking differently: Take 325 mg by mouth in the morning, at noon, and at bedtime.)    JANUVIA 25 MG tablet TAKE ONE TABLET BY MOUTH EVERY DAY (Patient taking differently: Take 25 mg by mouth daily.)    losartan (COZAAR) 25 MG tablet Take 1 tablet (25 mg total) by mouth daily.    omeprazole (PRILOSEC) 40 MG capsule TAKE 1 CAPSULE BY MOUTH ONCE DAILY (Patient  taking differently: Take 40 mg by mouth at bedtime.)    ONETOUCH ULTRA test strip USE AS DIRECTED    tadalafil, PAH, (ADCIRCA) 20 MG tablet Take 2 tablets (40 mg total) by mouth at bedtime.    torsemide (DEMADEX) 20 MG tablet Take 1 tablet (20 mg total) by mouth daily.    TOUJEO SOLOSTAR 300 UNIT/ML Solostar Pen INJECT 50 UNITS INTO THE SKIN DAILY (Patient taking differently: Inject 50 Units into the skin at bedtime.)    venlafaxine (EFFEXOR) 75 MG tablet Take 75 mg by mouth daily.     vitamin B-12 (CYANOCOBALAMIN) 1000 MCG tablet Take 1,000 mcg by mouth in the morning and at bedtime.    warfarin (COUMADIN) 3 MG tablet Take 3 mg on Wed and Saturday (Patient taking differently: Take 3 mg by mouth 2 (two) times a week. Take 1 tablet (3 mg) by mouth on Wednesdays & Saturdays in the evening.)    warfarin (COUMADIN) 4 MG tablet Take 4 mg on Sun, Mon, Tues, Thurs, Friday. (Patient taking differently: Take 4 mg by mouth See admin instructions. Take 1 tablet (4 mg) by mouth on Sundays, Mondays, Tuesdays, Thursdays & Fridays in the evening.)    No facility-administered encounter medications on file as of 08/24/2020.    Current Diagnosis: Patient Active Problem List   Diagnosis  Date Noted   Syncope and collapse 08/05/2020   Atrial fibrillation, chronic ([place]) 03/24/2020   Type 2 diabetes mellitus with stage 4 chronic kidney disease ([place]) 03/24/2020   Syncope 03/24/2020   Pulmonary hypertension ([place]) 03/24/2020   Subdural hematoma, acute (H[facility]) 03/24/2020   Pulmonary nodules 01/30/2020   Chronic pulmonary embolism, unspecified pulmonary embolism type, unspecified whether acute cor pulmonale present ([place]) 10/06/2019   Chronic respiratory failure with hypoxia ([place]) 10/06/2019   Morbid (severe) obesity due to excess calories ([place]) 10/06/2019   Hypertension associated with diabetes ([place]) 10/06/2019   Hyperlipidemia associated with type 2 diabetes mellitus ([place]) 10/06/2019   CKD (chronic kidney disease),  stage IV ([place]) 04/01/2019   Allergic rhinitis 03/28/2019   Arthritis 03/28/2019   CHF (congestive heart failure) ([place]) 03/28/2019   Diabetes mellitus without complication ([place]) 46/56/8127   Sleep apnea 03/28/2019   Pulmonary hypertension, primary ([place]) 03/28/2019    Goals Addressed   None    Reviewed chart for medication changes ahead of medication coordination call.  OVs, Consults, or hospital visits since last care coordination call/Pharmacist visit.  08/21/2020 Hospitalization Follow up Trinna Post  08/05/2020 ED  No medication changes indicated OR if recent visit, treatment plan here.  BP Readings from Last 3 Encounters:  08/21/20 140/80  08/09/20 127/82  07/24/20 (!) 112/53    Lab Results  Component Value Date   HGBA1C 7.0 (A) 07/24/2020     Patient obtains medications through Adherence Packaging  30 Days   Last adherence delivery included:  Atorvastatin 80 mg one tablet daily - Bedtime  Losartan 25 mg one tablet daily - Bedtime Januvia 25 mg one tablet daily - Breakfast  Metoprolol Tartrate 25 mg one tablet twice daily - Breakfast, Bedtime  Venlafaxine 75 mg one tablet daily - Breakfast   Toujeo U300 Inject 50 units into the skin daily    Coordinated acute fill to be delivered 08/24/20:  Jardiance 10 mg  Warfarin 4 mg  Warfarin 3 mg  Omeprazole 40 mg  Patient is due for next adherence delivery on: 08/31/2020. Called patient and reviewed medications and coordinated delivery.  This delivery to include: Atorvastatin 80 mg one tablet daily - Bedtime  Losartan 25 mg one tablet daily - Bedtime Januvia 25 mg one tablet daily - Breakfast  Metoprolol Tartrate 25 mg one tablet twice daily - Breakfast, Bedtime  Venlafaxine 75 mg one tablet daily - Breakfast   Toujeo U300 Inject 50 units into the skin daily  Jardiance 10 mg  Daily- Breakfast Warfarin 4 mg Daily- Vials Warfarin 3 mg Daily - Vials Omeprazole 40 mg Daily - Breakfast Torsemide 20 mg Daily -   Breakfast   Patient will not need a short fill of medication, prior to adherence delivery. Patient will not need a acute fill of medication, prior to adherence delivery.  Patient declined the following medications (meds) due to (reason)  Patient needs refills for Januvia 25 MG , Toujeo U300 Injection, Warfarin 4 mg and Warfarin 3 mg reach out to PCP office to request refill on 08/24/2020 .  Confirmed delivery date of 08/31/2020, advised patient that pharmacy will contact them the morning of delivery.  Blood sugar readings: Patient states her blood Sugars has been ranging around 70's in the morning before breakfast.  Blood pressure readings: Patient states she can not remember what her blood pressure readings are and unsure where her log is.  Follow-Up:  Pharmacist Review   [place] Pharmacist Assistant [phone]   Addendum  08/27/20: Patient has been reporting fasting blood sugars in the 70s. Recommend decreasing Toujeo to 45 units daily  11 minutes spent in review, coordination, and documentation. [place] [phone]

## 2020-08-27 ENCOUNTER — Telehealth: Payer: Self-pay

## 2020-08-27 NOTE — Progress Notes (Signed)
Spoke to patient to inform her per clinical pharmacist  after discussion with PCP regarding her recent blood sugars,we want her to decrease her Toujeo from 50 units daily to 45 units daily.    I will plan to reach out to her in 2 weeks to check how her blood sugars are doing with that change if she continues to have lows.  Patient verbalized understanding.  [place] Pharmacist Assistant [phone]

## 2020-08-29 ENCOUNTER — Ambulatory Visit: Payer: Medicare Other | Admitting: Family Medicine

## 2020-08-29 ENCOUNTER — Other Ambulatory Visit: Payer: Self-pay | Admitting: Physician Assistant

## 2020-08-29 ENCOUNTER — Other Ambulatory Visit: Payer: Self-pay

## 2020-08-29 DIAGNOSIS — R791 Abnormal coagulation profile: Secondary | ICD-10-CM

## 2020-08-29 DIAGNOSIS — I482 Chronic atrial fibrillation, unspecified: Secondary | ICD-10-CM

## 2020-08-29 MED ORDER — WARFARIN SODIUM 4 MG PO TABS: ORAL_TABLET | ORAL | 1 refills | Status: DC

## 2020-08-29 MED ORDER — EMPAGLIFLOZIN 10 MG PO TABS: 10.0000 mg | ORAL_TABLET | Freq: Every day | ORAL | 6 refills | Status: DC

## 2020-08-29 MED ORDER — OMEPRAZOLE 40 MG PO CPDR
40.0000 mg | DELAYED_RELEASE_CAPSULE | Freq: Every day | ORAL | 1 refills | Status: DC
Start: 2020-08-29 — End: 2020-12-14

## 2020-08-29 MED ORDER — WARFARIN SODIUM 3 MG PO TABS: ORAL_TABLET | ORAL | 1 refills | Status: DC

## 2020-08-29 NOTE — Progress Notes (Signed)
Error 5 message twice. Pt is being sent to lab for PT/INR

## 2020-08-29 NOTE — Progress Notes (Signed)
Pt reviewed message via MyChart.

## 2020-08-29 NOTE — Progress Notes (Signed)
Advanced Heart Failure Clinic Note  PCP:  Trinna Post, PA-C  Cardiologist:  Dr. Rockey Situ HF Cardiologist: Dr. [name]  Chief Complaint: post-hospital follow up   History of Present Illness:  Ann Flynn is a 80 year old woman with h/o morbid obesity, DM, OSA on CPAP, CKD 4, PAD, CVA, carotid surgery in 2010, pulmonary embolism (2009) and PAH referred by [facility] for further evaluation of her PAH.   She recently moved from [place], [place] to be near her family as things were getting harder. Lives by herself in a townhome.  Reports DVT/PE in 2009 and has been on coumadin since. Was diagnosed with PAH in 2016 at [facility] in [place]. Right and left heart cath at that time reported as nonobstructive CAD and severe PAH with PAP 103/19 (51) with PCWP 5. (see results below) Was told she had idiopathic PAH and started on Letairis and cialis. She weighed 255 pounds at the time.   Never smoked but was exposed to 2ndhand smoke. Previouslyon portable oxygen at 5L when she was in [place], but the portable O2 was not covered. Now "goes without oxygen", only at nightthat she wear oxygen with CPAP. Denies h/o CTD.  I saw her for the first time in 11/20: Felt to have primarily WHO Group III PH. Echo and VQ ordered.   Echo 07/07/19: EF 55-60% RV mildly dilated with norma function. No RVSP calculated  VQ 07/07/19: No PE  RHC  07/29/19 RA = 8 RV = 81/14 PA =  80/18 (42) PCW =17 Fick cardiac output/index = 9.25/4.25 PVR = 2.70 WU FA sat = 98% PA sat = 75%, 75% High SVC 72%  Repeat RHC 9/21 RA = 8 RV = 65/7 PA = 61/19 (34) PCW = 19 (v= 27) Fick cardiac output/index = 8.1/3.8 PVR = 1.2 WU Ao sat = 99% PA sat = 75%, 78% SVC = 70%  Has seen Dr. Mortimer Fries in Pulmonary and had PFTs. Unable to do 6MW.  PFTs 09/28/19  FEV1 1.92 (57%) FVC 2.57 (58%) DLCO 61%  Previous cardiac studies:  12/16/2014 right and left heart catheterization with nitric oxide  inhalation, Pressure data resting: -Mean right atrial pressure 2 Right ventricule 428/7 and diastolic -Pulmonary artery 103/19, mean 51. Pulmonary artery wedge mean 5 -Ascending thoracic aorta 111/53, mean 75. -Fick cardiac output 4.2 L. -Pulmonary resistance 12.25 Woods units. With nitric oxide inhalation -Pulmonary artery 89/29, mean 42. -Right ventricle 68/1 and diastolic. -Right atrial 2. -Fick cardiac output 4.94 L. -Pulmonary resistance 15.91 Woods units.  Had a syncopal episode in 8/21. CT head showed small 2 mm subdural hematoma. Echocardiogram was obtained, echocardiogram showed EF 55 to 60%, moderate left ventricular hypertrophy, moderately elevated pulmonary artery systolic pressure. Amlodipine stopped Outpatient monitor was ordered and showed atrial flutter continuously (100% burden) with rates ranging from 50 to 133 (average 85 bpm). F/u CT scan 8/23 SDH resolved  OV 11/21 She felt more dizzy since decreasing torsemide, had a fall over Halloween. [name] was started and had successful DCCV on 07/10/20.  Admitted to [facility] 08/05/20-08/09/20 after a syncopal episode associated with dizziness prior to passing out. HR was in the 40-50s when EMS arrived. She admits to running out of her [name] recently. Echo 12/20 with EF 60-65%, RV normal, RHC with moderate to severe PAH with high PA pulse pressure and high cardiac output & moderately elevated left-sided pressures. Cardiac MRI showed EF 51%, mild RV dilation, mod pulmonary regurgitation, markedly dilated main pulmonay artery, and RV  insertion site LGE. Patient discharged home with Zio patch. Discharge weight 242 lbs.  Today she returns for post hospital follow up. She completed her carotid ultrasound today, results pending. Overall feeling fine. Can walk with rollator in house 50 feet before becoming SOB; this is her baseline. Continuously on 3L oxygen. Wears CPAP at night.  Denies CP, dizziness, edema, or PND/Orthopnea. Appetite  ok. No fever or chills. Weight at home 240 pounds. Taking all medications.   Carotid US 1/22:  08/06/20 Echo  EF 60-65% RV normal   08/07/20 RHC Findings: RA = 11 RV = 91/14 PA = 93/20 (47) PCW = 21 Fick cardiac output/index = 8.0/3.7 PVR = 3.1 WU FA sat = 99% PA sat = 72%, 73% High SVC = 72% PAPi = 6.6   Assessment: 1. Moderate to severe PAH with high PA pulse pressure and high cardiac output 2. Moderately elevated left-sided pressures  cMRI 08/07/20: 1. Motion artifact throughout exam affects quantification of volumes  2. No evidence of shunt with Qp/Qs = 0.94  3. Normal LV size and systolic function (EF 42%)  4. Mild RV dilatation with normal systolic function (EF 59%)  5. Moderate pulmonary regurgitation (regurgitant fraction 23%)  6. RV insertion site late gadolinium enhancement, which is a nonspecific finding often seen in setting of pulmonary hypertension  7. Markedly dilated main pulmonary artery, measuring 55m x 476m 8. Mild mitral regurgitation (regurgitant fraction 18%), mild aortic regurgitation (regurgitant fraction 15%), and mild tricuspid regurgitation (regurgitant fraction 12%)  Past Medical History:  Diagnosis Date  . A-fib ([place]  . Anemia   . CHF (congestive heart failure) ([facility]CC[facility]  . Chronic kidney disease   . Diabetes mellitus without complication ([facility]  . Hypertension   . Pulmonary hypertension ([place]  . Syncope 07/2020   Past Surgical History:  Procedure Laterality Date  . ABDOMINAL HYSTERECTOMY  1997  . ABDOMINAL HYSTERECTOMY    . CARDIOVERSION N/A 07/10/2020   Procedure: CARDIOVERSION;  Surgeon: BeJolaine ArtistMD;  [facility];  Service: Cardiovascular;  Laterality: N/A;  . cartiod artery surgery  2010   per patient   . CHOLECYSTECTOMY  10/19/2009  . masectomy    . MASTECTOMY  1994  . RIGHT HEART CATH N/A 07/29/2019   Procedure: RIGHT HEART CATH;  Surgeon: BeJolaine ArtistMD;  [facility];  Service: Cardiovascular;  Laterality: N/A;  . RIGHT HEART CATH N/A 05/11/2020   Procedure: RIGHT HEART CATH;  Surgeon: BeJolaine ArtistMD;  [facility];  Service: Cardiovascular;  Laterality: N/A;  . RIGHT HEART CATH N/A 08/07/2020   Procedure: RIGHT HEART CATH;  Surgeon: BeJolaine ArtistMD;  [facility];  Service: Cardiovascular;  Laterality: N/A;    Current Outpatient Medications  Medication Sig Dispense Refill  . acetaminophen (TYLENOL) 650 MG CR tablet Take 650-1,300 mg by mouth every 8 (eight) hours as needed for pain.    . Marland Kitchenmbrisentan (LETAIRIS) 5 MG tablet Take 5 mg by mouth at bedtime.    . Marland Kitchentorvastatin (LIPITOR) 80 MG tablet TAKE 1 TABLET BY MOUTH DAILY 90 tablet 2  . BD PEN NEEDLE NANO U/F 32G X 4 MM MISC USE TO INJECT ONCE DAILY AS DIRECTED 100 each 2  . Cholecalciferol (VITAMIN D3) 50 MCG (2000 UT) TABS Take 2,000 Units by mouth in the morning and at bedtime.    . docusate sodium (COLACE) 100 MG capsule Take 100 mg  by mouth daily as needed (constipation.).     [name] empagliflozin (JARDIANCE) 10 MG TABS tablet Take 1 tablet (10 mg total) by mouth daily before breakfast. 30 tablet 6  . FEROSUL 325 (65 Fe) MG tablet TAKE 1 TABLET BY MOUTH 3 TIMES DAILY WITH MEALS 90 tablet 0  . JANUVIA 25 MG tablet TAKE ONE TABLET BY MOUTH EVERY MORNING 30 tablet 0  . losartan (COZAAR) 50 MG tablet Take 50 mg by mouth daily.    [name] omeprazole (PRILOSEC) 40 MG capsule Take 1 capsule (40 mg total) by mouth daily. 90 capsule 1  . ONETOUCH ULTRA test strip USE AS DIRECTED 100 each 1  . tadalafil, PAH, (ADCIRCA) 20 MG tablet Take 2 tablets (40 mg total) by mouth at bedtime. 180 tablet 3  . torsemide (DEMADEX) 20 MG tablet Take 1 tablet (20 mg total) by mouth daily. 90 tablet 2  . TOUJEO SOLOSTAR 300 UNIT/ML Solostar Pen INJECT 50 UNITS SUBCUTANEOUSLY ONCE DAILY 4.5 mL 0  . venlafaxine (EFFEXOR) 75 MG tablet Take 75 mg by mouth daily.     . vitamin  B-12 (CYANOCOBALAMIN) 1000 MCG tablet Take 1,000 mcg by mouth in the morning and at bedtime.    [name] warfarin (COUMADIN) 4 MG tablet Take 4 mg on Sun, Mon, Tues, Thurs, Friday. (Patient taking differently: Take 4 mg by mouth daily. Take 4 mg on Sun, Mon, Tues, Thurs, Friday.) 30 tablet 1   No current facility-administered medications for this encounter.    Allergies:   Penicillins and Sulfa antibiotics   Social History:  The patient  reports that she has never smoked. She has never used smokeless tobacco. She reports that she does not drink alcohol and does not use drugs.   Family History:  The patient's family history includes CAD in her maternal grandmother and mother; Heart disease in her father.   ROS:  Please see the history of present illness.   All other systems are personally reviewed and negative.   Vitals:   08/30/20 0908  BP: 132/70  Pulse: 62  SpO2: 100%  Weight: 109.8 kg (242 lb)    Wt Readings from Last 3 Encounters:  08/30/20 109.8 kg (242 lb)  08/21/20 111.6 kg (246 lb)  08/08/20 109.9 kg (242 lb 4.6 oz)    Exam:   General:  Elderly woman, No resp difficulty, NAD HEENT: normal Neck: supple. no JVD. Carotids 2+ bilat; no bruits. No lymphadenopathy or thryomegaly appreciated. Cor: PMI nondisplaced. Regular rate & rhythm. 2/6 TR Lungs: clear Abdomen: obese, soft, nontender, nondistended. No hepatosplenomegaly. No bruits or masses. Good bowel sounds. Extremities: no cyanosis, clubbing, rash, edema Neuro: alert & oriented x3, cranial nerves grossly intact. moves all 4 extremities w/o difficulty. Affect pleasant  Recent Labs: 01/27/2020: B Natriuretic Peptide 450.6 07/24/2020: ALT 8 08/08/2020: BUN 49; Creatinine, Ser 1.97; Potassium 4.5; Sodium 140 08/09/2020: Hemoglobin 10.5; Platelets 242  Personally reviewed   ReDs: unable to obtain, low quality  ECG: SB, 58 bpm, 1st AVB, PR 210 ms (personally reviewed).  ASSESSMENT AND PLAN:  1. Recurrent Syncope - Given  severity of PAH, suspicion has been that this was related to [facility] but cardiac outputs and PA pulsativity argue against this somewhat unless preload dropped for some reason. She was bradycardic on arrival to ED which may also be contributing factor.   - No further syncopal episodes or dizziness since discharge. - Zio results pending. - No beta blocker.  2. Pulmonary HTN (severe) with cor pulmonale - Suspect  this is really WHO Group 3 disease due to OSA/OHS but given markedly elevated PVR may also have component of WHO Group I & IV - VQ 07/07/19: No PE - RV function normal on echo - PFTs with significant restrictive lung physiology -PA pressures are very high but PA pulsativity and cardiac output much higher than would be suspected in true PAH with pressures of this magnitude.  - cMRI showed no evidence of shunt, Qp/Qs=0.94. - Continue O2 supplementation and CPAP. - Continue tadalafil 40 mg and letairis 5 mg (unable to tolerate higher dose of letairis).  - With low PVR would not add selxipagat this point.  3. Chronic Diastolic HF -Plan as above. - NYHA III, volume status ok today. - Continue torsemide 20 mg daily.  - Continue losartan 25 mg daily. - BMET today.   4. CKD 4 - Baseline Creatinine 1.9 -Continue Jardiance. - BMET today.  5. Nonobstructive CAD - Nos/s angina. - Continue statin.  - Off ASA with warfarin.  6. H/o PE 2009 - On warfarin. - VQ 11/20 negative. - Does not want to switch to DOAC due to cost.  7. Paroxysmal AFL - New diagnosis. Was in AFL in 6/21. ECG from 11/20 was NSR. - s/p DC-CV 07/10/20. - SR on ECG today  - INR 2.7 (08/29/20)-followed by PCP  Follow up with Dr. [name] as scheduled in 4-6 weeeks.  Frankey Poot, FNP  08/30/2020 9:18 AM  Advanced Heart Failure [place] [address] [facility] [place] [address] [phone] (office) [phone] (fax)

## 2020-08-30 ENCOUNTER — Encounter: Payer: Self-pay | Admitting: Physician Assistant

## 2020-08-30 ENCOUNTER — Encounter (HOSPITAL_COMMUNITY): Payer: Self-pay

## 2020-08-30 ENCOUNTER — Other Ambulatory Visit: Payer: Self-pay

## 2020-08-30 ENCOUNTER — Ambulatory Visit (HOSPITAL_BASED_OUTPATIENT_CLINIC_OR_DEPARTMENT_OTHER)
Admission: RE | Admit: 2020-08-30 | Discharge: 2020-08-30 | Disposition: A | Discharge status: Home / Self Care | Payer: Medicare Other | Source: Ambulatory Visit

## 2020-08-30 ENCOUNTER — Ambulatory Visit (HOSPITAL_COMMUNITY)
Admission: RE | Admit: 2020-08-30 | Discharge: 2020-08-30 | Disposition: A | Discharge status: Home / Self Care | Payer: Medicare Other | Source: Ambulatory Visit | Attending: Family Medicine | Admitting: Family Medicine

## 2020-08-30 VITALS — BP 132/70 | HR 62 | Wt 242.0 lb

## 2020-08-30 DIAGNOSIS — R55 Syncope and collapse: Secondary | ICD-10-CM | POA: Diagnosis not present

## 2020-08-30 DIAGNOSIS — Z86718 Personal history of other venous thrombosis and embolism: Secondary | ICD-10-CM | POA: Diagnosis not present

## 2020-08-30 DIAGNOSIS — Z86711 Personal history of pulmonary embolism: Secondary | ICD-10-CM | POA: Insufficient documentation

## 2020-08-30 DIAGNOSIS — E1151 Type 2 diabetes mellitus with diabetic peripheral angiopathy without gangrene: Secondary | ICD-10-CM | POA: Insufficient documentation

## 2020-08-30 DIAGNOSIS — I130 Hypertensive heart and chronic kidney disease with heart failure and stage 1 through stage 4 chronic kidney disease, or unspecified chronic kidney disease: Secondary | ICD-10-CM | POA: Diagnosis not present

## 2020-08-30 DIAGNOSIS — Z7901 Long term (current) use of anticoagulants: Secondary | ICD-10-CM | POA: Diagnosis not present

## 2020-08-30 DIAGNOSIS — Z7984 Long term (current) use of oral hypoglycemic drugs: Secondary | ICD-10-CM | POA: Diagnosis not present

## 2020-08-30 DIAGNOSIS — Z8673 Personal history of transient ischemic attack (TIA), and cerebral infarction without residual deficits: Secondary | ICD-10-CM | POA: Diagnosis not present

## 2020-08-30 DIAGNOSIS — I6529 Occlusion and stenosis of unspecified carotid artery: Secondary | ICD-10-CM | POA: Diagnosis not present

## 2020-08-30 DIAGNOSIS — Z794 Long term (current) use of insulin: Secondary | ICD-10-CM | POA: Insufficient documentation

## 2020-08-30 DIAGNOSIS — Z79899 Other long term (current) drug therapy: Secondary | ICD-10-CM | POA: Diagnosis not present

## 2020-08-30 DIAGNOSIS — I270 Primary pulmonary hypertension: Secondary | ICD-10-CM | POA: Diagnosis not present

## 2020-08-30 DIAGNOSIS — I272 Pulmonary hypertension, unspecified: Secondary | ICD-10-CM | POA: Insufficient documentation

## 2020-08-30 DIAGNOSIS — I2510 Atherosclerotic heart disease of native coronary artery without angina pectoris: Secondary | ICD-10-CM

## 2020-08-30 DIAGNOSIS — I2781 Cor pulmonale (chronic): Secondary | ICD-10-CM | POA: Diagnosis not present

## 2020-08-30 DIAGNOSIS — I5032 Chronic diastolic (congestive) heart failure: Secondary | ICD-10-CM | POA: Diagnosis not present

## 2020-08-30 DIAGNOSIS — I25119 Atherosclerotic heart disease of native coronary artery with unspecified angina pectoris: Secondary | ICD-10-CM | POA: Insufficient documentation

## 2020-08-30 DIAGNOSIS — E1122 Type 2 diabetes mellitus with diabetic chronic kidney disease: Secondary | ICD-10-CM | POA: Insufficient documentation

## 2020-08-30 DIAGNOSIS — I480 Paroxysmal atrial fibrillation: Secondary | ICD-10-CM | POA: Insufficient documentation

## 2020-08-30 DIAGNOSIS — I509 Heart failure, unspecified: Secondary | ICD-10-CM

## 2020-08-30 DIAGNOSIS — N184 Chronic kidney disease, stage 4 (severe): Secondary | ICD-10-CM | POA: Insufficient documentation

## 2020-08-30 LAB — BASIC METABOLIC PANEL
Anion gap: 11 (ref 5–15)
BUN: 44 mg/dL — ABNORMAL HIGH (ref 8–23)
CO2: 24 mmol/L (ref 22–32)
Calcium: 8.7 mg/dL — ABNORMAL LOW (ref 8.9–10.3)
Chloride: 106 mmol/L (ref 98–111)
Creatinine, Ser: 1.65 mg/dL — ABNORMAL HIGH (ref 0.44–1.00)
GFR, Estimated: 31 mL/min — ABNORMAL LOW (ref >60)
Glucose, Bld: 112 mg/dL — ABNORMAL HIGH (ref 70–99)
Potassium: 4.2 mmol/L (ref 3.5–5.1)
Sodium: 141 mmol/L (ref 135–145)

## 2020-08-30 LAB — PROTIME-INR
INR: 2.7 — ABNORMAL HIGH (ref 0.9–1.2)
Prothrombin Time: 27.7 s — ABNORMAL HIGH (ref 9.1–12.0)

## 2020-08-30 NOTE — Patient Instructions (Signed)
It was great to see you today! No medication changes are needed at this time.   Labs today We will only contact you if something comes back abnormal or we need to make some changes. Otherwise no news is good news!  Keep follow up as scheduled  If you have any questions or concerns before your next appointment please send [place] a message through [place] or call our office at [phone].    TO LEAVE A MESSAGE FOR THE NURSE SELECT OPTION 2, PLEASE LEAVE A MESSAGE INCLUDING: . YOUR NAME . DATE OF BIRTH . CALL BACK NUMBER . REASON FOR CALL**this is important as we prioritize the call backs  YOU WILL RECEIVE A CALL BACK THE SAME DAY AS LONG AS YOU CALL BEFORE 4:00 PM

## 2020-08-30 NOTE — Progress Notes (Signed)
Carotid duplex has been completed.  June Leap, BS, RDMS, RVT

## 2020-09-04 ENCOUNTER — Telehealth: Payer: Self-pay

## 2020-09-04 NOTE — Telephone Encounter (Signed)
Able to reach Ann Flynn regarding her recent cardioid u/s  , Dr. Rockey Situ had a chance to review her results and advised   "Carotid u/s  Right Carotid: Velocities in the right ICA are consistent with a 40-59%stenosis.    Left Carotid: Velocities in the left ICA are consistent with a 1-39% stenosis."  Ann Flynn verbalized understanding of results, no change in medications or plan of care at this time, otherwise all questions or concerns were address and no additional concerns at this time, Ann Flynn will call back for anything further.

## 2020-09-07 NOTE — Addendum Note (Signed)
Encounter addended by: Micki Riley, RN on: 09/07/2020 11:33 AM  Actions taken: Imaging Exam ended

## 2020-09-10 ENCOUNTER — Telehealth: Payer: Medicare Other

## 2020-09-11 ENCOUNTER — Telehealth: Payer: Medicare Other

## 2020-09-23 ENCOUNTER — Encounter (HOSPITAL_COMMUNITY): Payer: Self-pay

## 2020-09-24 ENCOUNTER — Other Ambulatory Visit: Payer: Self-pay | Admitting: Physician Assistant

## 2020-09-24 ENCOUNTER — Telehealth: Payer: Self-pay

## 2020-09-24 MED ORDER — SITAGLIPTIN PHOSPHATE 25 MG PO TABS: 25.0000 mg | ORAL_TABLET | Freq: Every morning | ORAL | 0 refills | Status: DC

## 2020-09-24 MED ORDER — ATORVASTATIN CALCIUM 80 MG PO TABS: 80.0000 mg | ORAL_TABLET | Freq: Every day | ORAL | 2 refills | Status: AC

## 2020-09-24 MED ORDER — TOUJEO SOLOSTAR 300 UNIT/ML ~~LOC~~ SOPN: PEN_INJECTOR | SUBCUTANEOUS | 0 refills | Status: DC

## 2020-09-24 NOTE — Telephone Encounter (Signed)
Requested Prescriptions  Pending Prescriptions Disp Refills  . atorvastatin (LIPITOR) 80 MG tablet 90 tablet 2    Sig: Take 1 tablet (80 mg total) by mouth daily.     Cardiovascular:  Antilipid - Statins Failed - 09/24/2020 10:32 AM      Failed - LDL in normal range and within 360 days    LDL Chol Calc (NIH)  Date Value Ref Range Status  01/18/2020 46 0 - 99 mg/dL Final         Passed - Total Cholesterol in normal range and within 360 days    Cholesterol, Total  Date Value Ref Range Status  01/18/2020 104 100 - 199 mg/dL Final         Passed - HDL in normal range and within 360 days    HDL  Date Value Ref Range Status  01/18/2020 43 >39 mg/dL Final         Passed - Triglycerides in normal range and within 360 days    Triglycerides  Date Value Ref Range Status  01/18/2020 68 0 - 149 mg/dL Final         Passed - Patient is not pregnant      Passed - Valid encounter within last 12 months    Recent Outpatient Visits          1 month ago Syncope, unspecified syncope type   [place], Adriana M, PA-C   2 months ago Type 2 diabetes mellitus with stage 4 chronic kidney disease, with long-term current use of insulin [facility])   [place], [place], PA-C   5 months ago Atrial fibrillation, chronic [facility])   [place], [place], PA-C   5 months ago Cerebrovascular accident (CVA), unspecified mechanism [facility])   [facility] Birdie Sons, MD   8 months ago Congestive heart failure, unspecified HF chronicity, unspecified heart failure type [facility])   [place], Wendee Beavers, PA-C      Future Appointments            In 4 weeks Trinna Post, PA-C [employer]           Signed Prescriptions Disp Refills   sitaGLIPtin (JANUVIA) 25 MG tablet 30 tablet 0    Sig: Take 1 tablet (25 mg total) by mouth every morning.     Endocrinology:  Diabetes - DPP-4  Inhibitors Failed - 09/24/2020 10:32 AM      Failed - Cr in normal range and within 360 days    Creatinine, Ser  Date Value Ref Range Status  08/30/2020 1.65 (H) 0.44 - 1.00 mg/dL Final         Passed - HBA1C is between 0 and 7.9 and within 180 days    Hemoglobin A1C  Date Value Ref Range Status  07/24/2020 7.0 (A) 4.0 - 5.6 % Final   Hgb A1c MFr Bld  Date Value Ref Range Status  03/24/2020 7.9 (H) 4.8 - 5.6 % Final    Comment:    (NOTE) Pre diabetes:          5.7%-6.4%  Diabetes:              >6.4%  Glycemic control for   <7.0% adults with diabetes          Passed - Valid encounter within last 6 months    Recent Outpatient Visits          1 month ago  Syncope, unspecified syncope type   [place], [place], PA-C   2 months ago Type 2 diabetes mellitus with stage 4 chronic kidney disease, with long-term current use of insulin [facility])   [place], [place], [place]   5 months ago Atrial fibrillation, chronic [facility])   [facility] [place], Fabio Bering M, PA-C   5 months ago Cerebrovascular accident (CVA), unspecified mechanism [place])   [facility] Birdie Sons, MD   8 months ago Congestive heart failure, unspecified HF chronicity, unspecified heart failure type [facility])   [facility] Trinna Post, PA-C      Future Appointments            In 4 weeks Trinna Post, PA-C [employer]            insulin glargine, 1 Unit Dial, (TOUJEO SOLOSTAR) 300 UNIT/ML Solostar Pen 4.5 mL 0    Sig: INJECT [place]     Endocrinology:  Diabetes - Insulins Passed - 09/24/2020 10:32 AM      Passed - HBA1C is between 0 and 7.9 and within 180 days    Hemoglobin A1C  Date Value Ref Range Status  07/24/2020 7.0 (A) 4.0 - 5.6 % Final   Hgb A1c MFr Bld  Date Value Ref Range Status  03/24/2020 7.9 (H) 4.8 - 5.6 % Final    Comment:    (NOTE) Pre  diabetes:          5.7%-6.4%  Diabetes:              >6.4%  Glycemic control for   <7.0% adults with diabetes          Passed - Valid encounter within last 6 months    Recent Outpatient Visits          1 month ago Syncope, unspecified syncope type   [facility] [place], Adriana M, PA-C   2 months ago Type 2 diabetes mellitus with stage 4 chronic kidney disease, with long-term current use of insulin [facility])   [place], [place], PA-C   5 months ago Atrial fibrillation, chronic [facility])   [place], [place], PA-C   5 months ago Cerebrovascular accident (CVA), unspecified mechanism [facility])   [facility] Birdie Sons, MD   8 months ago Congestive heart failure, unspecified HF chronicity, unspecified heart failure type [facility])   [facility] Trinna Post, [place]      Future Appointments            In 4 weeks Trinna Post, [place], [facility]

## 2020-09-24 NOTE — Progress Notes (Signed)
Chronic Care Management Pharmacy Assistant   Name: Ann Flynn  MRN: [id number] DOB: 09/18/1940  Reason for Encounter: Medication Review  Patient Questions:  1.  Have you seen any other providers since your last visit? Yes, 09/06/2020 Nephrology Harmeet Candiss [place]  2.  Any changes in your medicines or health? Yes, 09/06/2020 Nephrology Harmeet Candiss [place] hold losartan until no further drops in BP with standing ,08/27/2020 PCP decrease her Toujeo from 50 units daily to 45 units daily.    PCP : Trinna Post, PA-C  Allergies:   Allergies  Allergen Reactions  . Penicillins Swelling    Facial swelling Did it involve swelling of the face/tongue/throat, SOB, or low BP? Yes Did it involve sudden or severe rash/hives, skin peeling, or any reaction on the inside of your mouth or nose? No Did you need to seek medical attention at a hospital or doctor's office? No When did it last happen?10 + years If all above answers are "NO", may proceed with cephalosporin use.   . Sulfa Antibiotics Rash    Medications: Outpatient Encounter Medications as of 09/24/2020  Medication Sig  . acetaminophen (TYLENOL) 650 MG CR tablet Take 650-1,300 mg by mouth every 8 (eight) hours as needed for pain.  [name] ambrisentan (LETAIRIS) 5 MG tablet Take 5 mg by mouth at bedtime.  [name] atorvastatin (LIPITOR) 80 MG tablet TAKE 1 TABLET BY MOUTH DAILY  . BD PEN NEEDLE NANO U/F 32G X 4 MM MISC USE TO INJECT ONCE DAILY AS DIRECTED  . Cholecalciferol (VITAMIN D3) 50 MCG (2000 UT) TABS Take 2,000 Units by mouth in the morning and at bedtime.  . docusate sodium (COLACE) 100 MG capsule Take 100 mg by mouth daily as needed (constipation.).   [name] empagliflozin (JARDIANCE) 10 MG TABS tablet Take 1 tablet (10 mg total) by mouth daily before breakfast.  . FEROSUL 325 (65 Fe) MG tablet TAKE 1 TABLET BY MOUTH 3 TIMES DAILY WITH MEALS  . JANUVIA 25 MG tablet TAKE ONE TABLET BY MOUTH EVERY MORNING  . losartan (COZAAR) 50 MG tablet Take  50 mg by mouth daily.  [name] omeprazole (PRILOSEC) 40 MG capsule Take 1 capsule (40 mg total) by mouth daily.  [name] ULTRA test strip USE AS DIRECTED  . tadalafil, PAH, (ADCIRCA) 20 MG tablet Take 2 tablets (40 mg total) by mouth at bedtime.  . torsemide (DEMADEX) 20 MG tablet Take 1 tablet (20 mg total) by mouth daily.  [name] SOLOSTAR 300 UNIT/ML Solostar Pen INJECT 50 UNITS SUBCUTANEOUSLY ONCE DAILY  . venlafaxine (EFFEXOR) 75 MG tablet Take 75 mg by mouth daily.   . vitamin B-12 (CYANOCOBALAMIN) 1000 MCG tablet Take 1,000 mcg by mouth in the morning and at bedtime.  [name] warfarin (COUMADIN) 4 MG tablet Take 4 mg on Sun, Mon, Tues, Thurs, Friday. (Patient taking differently: Take 4 mg by mouth daily. Take 4 mg on Sun, Mon, Tues, Thurs, Friday.)   No facility-administered encounter medications on file as of 09/24/2020.    Current Diagnosis: Patient Active Problem List   Diagnosis Date Noted  . Syncope and collapse 08/05/2020  . Atrial fibrillation, chronic ([place]) 03/24/2020  . Type 2 diabetes mellitus with stage 4 chronic kidney disease ([place]) 03/24/2020  . Syncope 03/24/2020  . Pulmonary hypertension ([place]) 03/24/2020  . Subdural hematoma, acute ([place]) 03/24/2020  . Pulmonary nodules 01/30/2020  . Chronic pulmonary embolism, unspecified pulmonary embolism type, unspecified whether acute cor pulmonale present ([place]) 10/06/2019  . Chronic respiratory failure with hypoxia (  [place]) 10/06/2019  . Morbid (severe) obesity due to excess calories ([place]) 10/06/2019  . Hypertension associated with diabetes ([place]) 10/06/2019  . Hyperlipidemia associated with type 2 diabetes mellitus ([place]) 10/06/2019  . CKD (chronic kidney disease), stage IV ([place]) 04/01/2019  . Allergic rhinitis 03/28/2019  . Arthritis 03/28/2019  . CHF (congestive heart failure) ([place]) 03/28/2019  . Diabetes mellitus without complication ([place]) 36/72/5500  . Sleep apnea 03/28/2019  . Pulmonary hypertension, primary ([place]) 03/28/2019     Goals Addressed   None    Reviewed chart for medication changes ahead of medication coordination call.  OVs, Consults, or hospital visits since last care coordination call/Pharmacist visit.   Yes, 09/06/2020 Nephrology Harmeet Candiss [place] Medication changes indicated:  Yes, 09/06/2020 Nephrology Harmeet Candiss [place] hold losartan until no futher drops in BP with standing  08/27/2020 PCP decrease her Toujeo from 50 units daily to 45 units daily. BP Readings from Last 3 Encounters:  08/30/20 132/70  08/21/20 140/80  08/09/20 127/82    Lab Results  Component Value Date   HGBA1C 7.0 (A) 07/24/2020     Patient obtains medications through Adherence Packaging  30 Days   Last adherence delivery included:  Atorvastatin 80 mg one tablet daily -Bedtime  Losartan 25 mg one tablet daily -Bedtime Januvia 25 mg one tablet daily -Breakfast  Metoprolol Tartrate 25 mg one tablet twice daily -Breakfast, Bedtime  Venlafaxine 75 mg one tablet daily -Breakfast  Toujeo U300 Inject 50 units into the skin daily Jardiance 10 mg  Daily- Breakfast Warfarin 4 mg Daily- Vials Warfarin 3 mg Daily - Vials Omeprazole 40 mg Daily - Breakfast Torsemide 20 mg Daily -  Breakfast  Patient declined medication last month None ID   Patient is due for next adherence delivery on: 10/01/2020. Called patient and reviewed medications and coordinated delivery.  This delivery to include:  Atorvastatin 80 mg one tablet daily -Bedtime   Januvia 25 mg one tablet daily -Breakfast   Venlafaxine 75 mg one tablet daily -Breakfast   Toujeo U300 Inject 45 units into the skin daily  Jardiance 10 mg  Daily- Breakfast  Warfarin 4 mg Daily- Vials  Omeprazole 40 mg Daily - Breakfast  Torsemide 20 mg Daily -  Breakfast  BD Pen Needle Nano - Daily   Patient will not need a short fill of medication, prior to adherence delivery.  Patient will not need a acute fill of medication, prior to adherence delivery.  Patient  declined the following medications:  Losartan 25 mg one tablet daily -Bedtime (Nephrology Harmeet Singh hold losartan)   Metoprolol Tartrate 25 mg one tablet twice daily (Patient states she stopping taking about a month ago due to "drops in blood pressure"  Warfarin 3 mg Daily - Vials (adequate supply PRN)  Patient needs refills for : Atorvastatin 80 mg, Januvia 25 mg,Toujeo U300 , reach out to PCP to request refill on 09/24/2020.  Confirmed delivery date of 10/01/2020, advised patient that pharmacy will contact them the morning of delivery.  Blood Sugars readings: Patient states her blood sugar has been ranging from 125-170. Follow-Up:  Pharmacist Review   Bessie Dupont Pharmacist Assistant [phone]

## 2020-09-24 NOTE — Telephone Encounter (Signed)
  It just showed that she was having a lot of extra beats from the top of her heart. These are not dangerous. Lets start Toprol XL 42m at night and see if that helps them .

## 2020-09-24 NOTE — Telephone Encounter (Signed)
Requested Prescriptions  Pending Prescriptions Disp Refills  . atorvastatin (LIPITOR) 80 MG tablet 90 tablet 2    Sig: Take 1 tablet (80 mg total) by mouth daily.     Cardiovascular:  Antilipid - Statins Failed - 09/24/2020 10:32 AM      Failed - LDL in normal range and within 360 days    LDL Chol Calc (NIH)  Date Value Ref Range Status  01/18/2020 46 0 - 99 mg/dL Final         Passed - Total Cholesterol in normal range and within 360 days    Cholesterol, Total  Date Value Ref Range Status  01/18/2020 104 100 - 199 mg/dL Final         Passed - HDL in normal range and within 360 days    HDL  Date Value Ref Range Status  01/18/2020 43 >39 mg/dL Final         Passed - Triglycerides in normal range and within 360 days    Triglycerides  Date Value Ref Range Status  01/18/2020 68 0 - 149 mg/dL Final         Passed - Patient is not pregnant      Passed - Valid encounter within last 12 months    Recent Outpatient Visits          1 month ago Syncope, unspecified syncope type   [place], Adriana M, PA-C   2 months ago Type 2 diabetes mellitus with stage 4 chronic kidney disease, with long-term current use of insulin [facility])   [place], [place], PA-C   5 months ago Atrial fibrillation, chronic [facility])   [place], [place], PA-C   5 months ago Cerebrovascular accident (CVA), unspecified mechanism [facility])   [facility] Birdie Sons, MD   8 months ago Congestive heart failure, unspecified HF chronicity, unspecified heart failure type [facility])   [facility] Trinna Post, [place]      Future Appointments            In 4 weeks Trinna Post, PA-C [employer]           . sitaGLIPtin (JANUVIA) 25 MG tablet 30 tablet 0    Sig: Take 1 tablet (25 mg total) by mouth every morning.     Endocrinology:  Diabetes - DPP-4 Inhibitors Failed - 09/24/2020 10:32 AM       Failed - Cr in normal range and within 360 days    Creatinine, Ser  Date Value Ref Range Status  08/30/2020 1.65 (H) 0.44 - 1.00 mg/dL Final         Passed - HBA1C is between 0 and 7.9 and within 180 days    Hemoglobin A1C  Date Value Ref Range Status  07/24/2020 7.0 (A) 4.0 - 5.6 % Final   Hgb A1c MFr Bld  Date Value Ref Range Status  03/24/2020 7.9 (H) 4.8 - 5.6 % Final    Comment:    (NOTE) Pre diabetes:          5.7%-6.4%  Diabetes:              >6.4%  Glycemic control for   <7.0% adults with diabetes          Passed - Valid encounter within last 6 months    Recent Outpatient Visits          1 month ago Syncope, unspecified syncope type  [place], [place], PA-C   2 months ago Type 2 diabetes mellitus with stage 4 chronic kidney disease, with long-term current use of insulin [facility])   [place], [place], [place]   5 months ago Atrial fibrillation, chronic [facility])   [place], [place], PA-C   5 months ago Cerebrovascular accident (CVA), unspecified mechanism [facility])   [facility] Birdie Sons, MD   8 months ago Congestive heart failure, unspecified HF chronicity, unspecified heart failure type [facility])   [facility] Trinna Post, PA-C      Future Appointments            In 4 weeks Trinna Post, PA-C [employer]           . insulin glargine, 1 Unit Dial, (TOUJEO SOLOSTAR) 300 UNIT/ML Solostar Pen 4.5 mL 0     Endocrinology:  Diabetes - Insulins Passed - 09/24/2020 10:32 AM      Passed - HBA1C is between 0 and 7.9 and within 180 days    Hemoglobin A1C  Date Value Ref Range Status  07/24/2020 7.0 (A) 4.0 - 5.6 % Final   Hgb A1c MFr Bld  Date Value Ref Range Status  03/24/2020 7.9 (H) 4.8 - 5.6 % Final    Comment:    (NOTE) Pre diabetes:          5.7%-6.4%  Diabetes:              >6.4%  Glycemic control for    <7.0% adults with diabetes          Passed - Valid encounter within last 6 months    Recent Outpatient Visits          1 month ago Syncope, unspecified syncope type   [place], PA-C   2 months ago Type 2 diabetes mellitus with stage 4 chronic kidney disease, with long-term current use of insulin [facility])   [place], [place], PA-C   5 months ago Atrial fibrillation, chronic [facility])   [place], [place], PA-C   5 months ago Cerebrovascular accident (CVA), unspecified mechanism [facility])   [facility] Birdie Sons, MD   8 months ago Congestive heart failure, unspecified HF chronicity, unspecified heart failure type [facility])   [facility] Trinna Post, [place]      Future Appointments            In 4 weeks Trinna Post, PA-C [employer]

## 2020-09-24 NOTE — Telephone Encounter (Signed)
Medication: atorvastatin (LIPITOR) 80 MG tablet JANUVIA 25 MG tablet TOUJEO SOLOSTAR 300 UNIT/ML Solostar Pen Has the pt contacted their pharmacy? Pharmacy is calling  Preferred pharmacy: [place], [place] - [address]  Please be advised refills may take up to 3 business days.  We ask that you follow up with your pharmacy.

## 2020-09-25 ENCOUNTER — Other Ambulatory Visit (HOSPITAL_COMMUNITY): Payer: Self-pay | Admitting: *Deleted

## 2020-09-25 MED ORDER — METOPROLOL SUCCINATE ER 25 MG PO TB24: 25.0000 mg | ORAL_TABLET | Freq: Every evening | ORAL | 3 refills | Status: DC

## 2020-10-02 NOTE — Progress Notes (Signed)
Advanced Heart Failure Clinic Note  PCP:  Ann Sailors, PA-C  Cardiologist:  Dr. Mariah Flynn HF Cardiologist: Dr. Gala Flynn  Chief Complaint: post-hospital follow up   History of Present Illness:  Ann Flynn is a 80 year old woman with h/o morbid obesity, DM, OSA on CPAP, CKD 4, PAD, CVA, carotid surgery in 2010, pulmonary embolism (2009) and PAH referred by [facility] for further evaluation of her PAH.   She moved from [place], [place] in 2021 to be near her family as things were getting harder. Lives by herself in a townhome.  Reports DVT/PE in 2009 and has been on coumadin since. Was diagnosed with PAH in 2016 at [facility] in [facility] [place]. Right and left heart cath at that time reported as nonobstructive CAD and severe PAH with PAP 103/19 (51) with PCWP 5. (see results below) Was told she had idiopathic PAH and started on Letairis and cialis. She weighed 255 pounds at the time.   Never smoked but was exposed to 2ndhand smoke. Previouslyon portable oxygen at 5L when she was in [place], but the portable O2 was not covered. Now "goes without oxygen", only at nightthat she wear oxygen with CPAP. Denies h/o CTD.  I saw her for the first time in 11/20: Felt to have primarily WHO Group III PH. Echo and VQ ordered.   Echo 07/07/19: EF 55-60% RV mildly dilated with norma function. No RVSP calculated  VQ 07/07/19: No PE  RHC  07/29/19 RA = 8 RV = 81/14 PA =  80/18 (42) PCW =17 Fick cardiac output/index = 9.25/4.25 PVR = 2.70 WU FA sat = 98% PA sat = 75%, 75% High SVC 72%  RHC 9/21 RA = 8 RV = 65/7 PA = 61/19 (34) PCW = 19 (v= 27) Fick cardiac output/index = 8.1/3.8 PVR = 1.2 WU Ao sat = 99% PA sat = 75%, 78% SVC = 70%  08/07/20 RHC Findings: RA = 11 RV = 91/14 PA = 93/20 (47) PCW = 21 Fick cardiac output/index = 8.0/3.7 PVR = 3.1 WU FA sat = 99% PA sat = 72%, 73% High SVC = 72% PAPi = 6.6   Has seen Dr. Belia Flynn in Pulmonary and had PFTs. Unable to do .   PFTs 09/28/19  FEV1 1.92 (57%) FVC 2.57 (58%) DLCO 61%  Previous cardiac studies:  12/16/2014 right and left heart catheterization with nitric oxide inhalation, Pressure data resting: -Mean right atrial pressure 2 Right ventricule 103/9 and diastolic -Pulmonary artery 103/19, mean 51. Pulmonary artery wedge mean 5 -Ascending thoracic aorta 111/53, mean 75. -Fick cardiac output 4.2 L. -Pulmonary resistance 12.25 Woods units. With nitric oxide inhalation -Pulmonary artery 89/29, mean 42. -Right ventricle 91/3 and diastolic. -Right atrial 2. -Fick cardiac output 4.94 L. -Pulmonary resistance 15.91 Woods units.  Had a syncopal episode in 8/21. CT head showed small 2 mm subdural hematoma. Echocardiogram was obtained, echocardiogram showed EF 55 to 60%, moderate left ventricular hypertrophy, moderately elevated pulmonary artery systolic pressure. Amlodipine stopped Outpatient monitor was ordered and showed atrial flutter continuously (100% burden) with rates ranging from 50 to 133 (average 85 bpm). F/u CT scan 8/23 SDH resolved  OV 11/21 She felt more dizzy since decreasing torsemide, had a fall over Halloween. Ann Flynn was started and had successful DCCV on 07/10/20.  Admitted to [facility] 12/21 after a syncopal episode associated with dizziness prior to passing out. HR was in the 40-50s when EMS arrived. She admits to running out of her Ann Flynn recently. Echo 12/20 with  EF 60-65%, RV normal, RHC with moderate to severe PAH with high PA pulse pressure and high cardiac output & moderately elevated left-sided pressures. Cardiac MRI showed EF 51%, mild RV dilation, mod pulmonary regurgitation, markedly dilated main pulmonay artery, and RV insertion site LGE. Patient discharged home with Zio patch. Discharge weight 242 lbs.  Today she returns for routine f/u. Feeling much better. Getting around more. Now able to go to the grocery store using her walker. No further syncope. Feels like she is more  sleepy and falling asleep more. No edema, orthopnea or PND. Using CPAP every night.   Carotid US 1/22:  08/06/20 Echo  EF 60-65% RV normal    cMRI 08/07/20: 1. Motion artifact throughout exam affects quantification of volumes 2. No evidence of shunt with Qp/Qs = 0.94 3. Normal LV size and systolic function (EF 51%) 4. Mild RV dilatation with normal systolic function (EF 48%) 5. Moderate pulmonary regurgitation (regurgitant fraction 23%) 6. RV insertion site late gadolinium enhancement, which is a nonspecific finding often seen in setting of pulmonary hypertension 7. Markedly dilated main pulmonary artery, measuring 52mm x 44mm 8. Mild mitral regurgitation (regurgitant fraction 18%), mild aortic regurgitation (regurgitant fraction 15%), and mild tricuspid regurgitation (regurgitant fraction 12%)  Past Medical History:  Diagnosis Date  . A-fib (HCC)   . Anemia   . CHF (congestive heart failure) (HCC)   . Chronic kidney disease   . Diabetes mellitus without complication (HCC)   . Hypertension   . Pulmonary hypertension (HCC)   . Syncope 07/2020   Past Surgical History:  Procedure Laterality Date  . ABDOMINAL HYSTERECTOMY  1997  . ABDOMINAL HYSTERECTOMY    . CARDIOVERSION N/A 07/10/2020   Procedure: CARDIOVERSION;  Surgeon: Ann Patty, MD;  Location: [facility] ENDOSCOPY;  Service: Cardiovascular;  Laterality: N/A;  . cartiod artery surgery  2010   per patient   . CHOLECYSTECTOMY  10/19/2009  . masectomy    . MASTECTOMY  1994  . RIGHT HEART CATH N/A 07/29/2019   Procedure: RIGHT HEART CATH;  Surgeon: Ann Patty, MD;  Location: [facility] INVASIVE CV LAB;  Service: Cardiovascular;  Laterality: N/A;  . RIGHT HEART CATH N/A 05/11/2020   Procedure: RIGHT HEART CATH;  Surgeon: Ann Patty, MD;  Location: [facility] INVASIVE CV LAB;  Service: Cardiovascular;  Laterality: N/A;  . RIGHT HEART CATH N/A 08/07/2020   Procedure: RIGHT HEART CATH;  Surgeon: Ann Patty,  MD;  Location: [facility] INVASIVE CV LAB;  Service: Cardiovascular;  Laterality: N/A;    Current Outpatient Medications  Medication Sig Dispense Refill  . acetaminophen (TYLENOL) 650 MG CR tablet Take 650-1,300 mg by mouth every 8 (eight) hours as needed for pain.    [name] atorvastatin (LIPITOR) 80 MG tablet Take 1 tablet (80 mg total) by mouth daily. 90 tablet 2  . BD PEN NEEDLE NANO U/F 32G X 4 MM MISC USE TO INJECT ONCE DAILY AS DIRECTED 100 each 2  . Cholecalciferol (VITAMIN D3) 50 MCG (2000 UT) TABS Take 2,000 Units by mouth in the morning and at bedtime.    . docusate sodium (COLACE) 100 MG capsule Take 100 mg by mouth daily as needed (constipation.).     [name] empagliflozin (JARDIANCE) 10 MG TABS tablet Take 1 tablet (10 mg total) by mouth daily before breakfast. 30 tablet 6  . insulin glargine, 1 Unit Dial, (TOUJEO SOLOSTAR) 300 UNIT/ML Solostar Pen INJECT 50 UNITS SUBCUTANEOUSLY ONCE DAILY 4.5 mL 0  . losartan (COZAAR) 50 MG  tablet Take 50 mg by mouth daily.    . metoprolol succinate (TOPROL XL) 25 MG 24 hr tablet Take 1 tablet (25 mg total) by mouth every evening. 30 tablet 3  . omeprazole (PRILOSEC) 40 MG capsule Take 1 capsule (40 mg total) by mouth daily. 90 capsule 1  . ONETOUCH ULTRA test strip USE AS DIRECTED 100 each 1  . sitaGLIPtin (JANUVIA) 25 MG tablet Take 1 tablet (25 mg total) by mouth every morning. 30 tablet 0  . tadalafil, PAH, (ADCIRCA) 20 MG tablet Take 2 tablets (40 mg total) by mouth at bedtime. 180 tablet 3  . torsemide (DEMADEX) 20 MG tablet Take 1 tablet (20 mg total) by mouth daily. 90 tablet 2  . venlafaxine (EFFEXOR) 75 MG tablet Take 75 mg by mouth daily.     . vitamin B-12 (CYANOCOBALAMIN) 1000 MCG tablet Take 1,000 mcg by mouth in the morning and at bedtime.    [name] warfarin (COUMADIN) 4 MG tablet Take 4 mg on Sun, Mon, Tues, Thurs, Friday. (Patient taking differently: Take 4 mg by mouth daily. Take 4 mg on Sun, Mon, Tues, Thurs, Friday.) 30 tablet 1   No current  facility-administered medications for this encounter.    Allergies:   Penicillins and Sulfa antibiotics   Social History:  The patient  reports that she has never smoked. She has never used smokeless tobacco. She reports that she does not drink alcohol and does not use drugs.   Family History:  The patient's family history includes CAD in her maternal grandmother and mother; Heart disease in her father.   ROS:  Please see the history of present illness.   All other systems are personally reviewed and negative.   Vitals:   10/03/20 1140  BP: 140/60  Pulse: (!) 52  SpO2: 100%  Weight: 108.9 kg (240 lb)    Wt Readings from Last 3 Encounters:  10/03/20 108.9 kg (240 lb)  08/30/20 109.8 kg (242 lb)  08/21/20 111.6 kg (246 lb)    Exam:   General:  Elderly woman, No resp difficulty, NAD  On O2 by [place] HEENT: normal Neck: supple. no JVD. Carotids 2+ bilat; no bruits. No lymphadenopathy or thryomegaly appreciated. Cor: PMI nondisplaced. Regular rate & rhythm. No rubs, gallops or murmurs. Lungs: clear Abdomen: obese soft, nontender, nondistended. No hepatosplenomegaly. No bruits or masses. Good bowel sounds. Extremities: no cyanosis, clubbing, rash, edema Neuro: alert & orientedx3, cranial nerves grossly intact. moves all 4 extremities w/o difficulty. Affect pleasant   Recent Labs: 01/27/2020: B Natriuretic Peptide 450.6 07/24/2020: ALT 8 08/09/2020: Hemoglobin 10.5; Platelets 242 08/30/2020: BUN 44; Creatinine, Ser 1.65; Potassium 4.2; Sodium 141  Personally reviewed    ASSESSMENT AND PLAN:  1. Recurrent Syncope - Given severity of PAH, suspicion has been that this was related to [facility] but cardiac outputs and PA pulsativity argue against this somewhat unless preload dropped for some reason. She was bradycardic on arrival to ED which may also be contributing factor.   - No further syncopal episodes or dizziness since discharge. - Zio 12/21. SR with frequent runs of SVT (1547 runs -  longest 58 seconds)/ No high-grade arrhythmias or pauses  2. Pulmonary HTN (severe) with cor pulmonale - Suspect combination of WHO Group 1 & 3 disease - VQ 07/07/19: No PE - RV function normal on echo - PFTs with significant restrictive lung physiology -PA pressures are very high but PA pulsativity and cardiac output much higher than would be suspected in true [facility]  with pressures of this magnitude.  - cMRI showed no evidence of shunt, Qp/Qs=0.94. - Continue O2 supplementation and CPAP. - Continue tadalafil 40 mg and letairis 5 mg (unable to tolerate higher dose of letairis).  - RHC 12/21 with PA pressures back up into 90s with elevated PVR. Will start selexipag,. - Has completed pulmonary rehab  3. Chronic Diastolic HF - NYHA III, volume status looks good - Continue torsemide 20 mg daily.  - Continue losartan 25 mg daily. - Labs today  4. CKD 4 - Baseline Creatinine 1.9 -Continue Jardiance. - Labs today  5. Nonobstructive CAD - No s/s angina - Continue statin.  - Off ASA with warfarin.  6. H/o PE 2009 - On warfarin. - VQ 11/20 negative. - Does not want to switch to DOAC due to cost.  7. Paroxysmal AFL - Was in AFL in 6/21. ECG from 11/20 was NSR. - s/p DC-CV 07/10/20. - Remains in NSR - On warfarin as above   Signed, Arvilla Meres, MD  10/03/2020 12:13 PM  Advanced Heart Failure Clinic [facility] [address] [facility] [place] [address] [phone] (office) [phone] (fax)

## 2020-10-03 ENCOUNTER — Other Ambulatory Visit: Payer: Self-pay

## 2020-10-03 ENCOUNTER — Ambulatory Visit (HOSPITAL_COMMUNITY)
Admission: RE | Admit: 2020-10-03 | Discharge: 2020-10-03 | Disposition: A | Discharge status: Home / Self Care | Payer: Medicare Other | Source: Ambulatory Visit | Attending: Internal Medicine | Admitting: Internal Medicine

## 2020-10-03 ENCOUNTER — Encounter (HOSPITAL_COMMUNITY): Payer: Self-pay | Admitting: Internal Medicine

## 2020-10-03 VITALS — BP 140/60 | HR 52 | Wt 240.0 lb

## 2020-10-03 DIAGNOSIS — Z86711 Personal history of pulmonary embolism: Secondary | ICD-10-CM | POA: Insufficient documentation

## 2020-10-03 DIAGNOSIS — I5032 Chronic diastolic (congestive) heart failure: Secondary | ICD-10-CM | POA: Insufficient documentation

## 2020-10-03 DIAGNOSIS — R5383 Other fatigue: Secondary | ICD-10-CM | POA: Insufficient documentation

## 2020-10-03 DIAGNOSIS — Z9049 Acquired absence of other specified parts of digestive tract: Secondary | ICD-10-CM | POA: Diagnosis not present

## 2020-10-03 DIAGNOSIS — Z880 Allergy status to penicillin: Secondary | ICD-10-CM | POA: Diagnosis not present

## 2020-10-03 DIAGNOSIS — I2510 Atherosclerotic heart disease of native coronary artery without angina pectoris: Secondary | ICD-10-CM | POA: Insufficient documentation

## 2020-10-03 DIAGNOSIS — I130 Hypertensive heart and chronic kidney disease with heart failure and stage 1 through stage 4 chronic kidney disease, or unspecified chronic kidney disease: Secondary | ICD-10-CM | POA: Diagnosis not present

## 2020-10-03 DIAGNOSIS — I471 Supraventricular tachycardia: Secondary | ICD-10-CM | POA: Diagnosis not present

## 2020-10-03 DIAGNOSIS — R55 Syncope and collapse: Secondary | ICD-10-CM | POA: Diagnosis not present

## 2020-10-03 DIAGNOSIS — I272 Pulmonary hypertension, unspecified: Secondary | ICD-10-CM | POA: Diagnosis not present

## 2020-10-03 DIAGNOSIS — Z882 Allergy status to sulfonamides status: Secondary | ICD-10-CM | POA: Insufficient documentation

## 2020-10-03 DIAGNOSIS — N184 Chronic kidney disease, stage 4 (severe): Secondary | ICD-10-CM

## 2020-10-03 DIAGNOSIS — R001 Bradycardia, unspecified: Secondary | ICD-10-CM | POA: Diagnosis not present

## 2020-10-03 DIAGNOSIS — I480 Paroxysmal atrial fibrillation: Secondary | ICD-10-CM | POA: Diagnosis not present

## 2020-10-03 DIAGNOSIS — I5022 Chronic systolic (congestive) heart failure: Secondary | ICD-10-CM | POA: Diagnosis not present

## 2020-10-03 DIAGNOSIS — Z8249 Family history of ischemic heart disease and other diseases of the circulatory system: Secondary | ICD-10-CM | POA: Diagnosis not present

## 2020-10-03 DIAGNOSIS — Z7901 Long term (current) use of anticoagulants: Secondary | ICD-10-CM | POA: Diagnosis not present

## 2020-10-03 DIAGNOSIS — Z7722 Contact with and (suspected) exposure to environmental tobacco smoke (acute) (chronic): Secondary | ICD-10-CM | POA: Diagnosis not present

## 2020-10-03 DIAGNOSIS — Z79899 Other long term (current) drug therapy: Secondary | ICD-10-CM | POA: Insufficient documentation

## 2020-10-03 DIAGNOSIS — I270 Primary pulmonary hypertension: Secondary | ICD-10-CM | POA: Diagnosis not present

## 2020-10-03 NOTE — Patient Instructions (Signed)
You have been recommended to start Uptravi, the pharmacy team will be in touch about starting it  Your physician recommends that you schedule a follow-up appointment in: 3 months with an echocardiogram  Your physician has requested that you have an echocardiogram. Echocardiography is a painless test that uses sound waves to create images of your heart. It provides your doctor with information about the size and shape of your heart and how well your heart's chambers and valves are working. This procedure takes approximately one hour. There are no restrictions for this procedure.  If you have any questions or concerns before your next appointment please send [place] a message through [place] or call our office at [phone].    TO LEAVE A MESSAGE FOR THE NURSE SELECT OPTION 2, PLEASE LEAVE A MESSAGE INCLUDING: . YOUR NAME . DATE OF BIRTH . CALL BACK NUMBER . REASON FOR CALL**this is important as we prioritize the call backs  YOU WILL RECEIVE A CALL BACK THE SAME DAY AS LONG AS YOU CALL BEFORE 4:00 PM

## 2020-10-04 ENCOUNTER — Telehealth (HOSPITAL_COMMUNITY): Payer: Self-pay | Admitting: Pharmacy Technician

## 2020-10-04 NOTE — Telephone Encounter (Signed)
Patient Advocate Encounter   Received notification from [facility] that prior authorization for Ann Flynn is required.   PA submitted on CoverMyMeds Key [id number] Status is pending   Will continue to follow.

## 2020-10-05 MED ORDER — UPTRAVI 200 MCG PO TABS: 200.0000 ug | ORAL_TABLET | Freq: Two times a day (BID) | ORAL | Status: AC

## 2020-10-05 NOTE — Addendum Note (Signed)
Encounter addended by: Scarlette Calico, RN on: 10/05/2020 5:35 PM  Actions taken: Order list changed

## 2020-10-11 NOTE — Telephone Encounter (Signed)
Advanced Heart Failure Patient Advocate Encounter  Prior Authorization for Ann Flynn has been approved.    PA# [id number] Effective dates: 10/04/20 through 08/17/21  Ann Flynn confirmed that the referral has been processed and sent to [place].  [name]

## 2020-10-18 ENCOUNTER — Other Ambulatory Visit: Payer: Self-pay | Admitting: Physician Assistant

## 2020-10-18 DIAGNOSIS — I482 Chronic atrial fibrillation, unspecified: Secondary | ICD-10-CM

## 2020-10-18 NOTE — Telephone Encounter (Signed)
Requested medication (s) are due for refill today:   Yes  Requested medication (s) are on the active medication list:   Yes  Future visit scheduled:   Yes in 5 days with Terrilee Croak   Last ordered: 08/29/2020 #30, 1 refill  Clinic note:   Returned because it's a non delegated refill   Requested Prescriptions  Pending Prescriptions Disp Refills   warfarin (COUMADIN) 4 MG tablet [Pharmacy Med Name: warfarin 4 mg tablet] 30 tablet 1    Sig: Take 4 mg on Sun, Mon, Tues, Thurs, Friday.      Hematology:  Anticoagulants - warfarin Failed - 10/18/2020  8:02 AM      Failed - This refill cannot be delegated      Failed - If the patient is managed by Coumadin Clinic - route to their Pool. If not, forward to the provider.      Failed - INR in normal range and within 30 days    INR  Date Value Ref Range Status  08/29/2020 2.7 (H) 0.9 - 1.2 Final    Comment:    Reference interval is for non-anticoagulated patients. Suggested INR therapeutic range for Vitamin K antagonist therapy:    Standard Dose (moderate intensity                   therapeutic range):       2.0 - 3.0    Higher intensity therapeutic range       2.5 - 3.5           Passed - Valid encounter within last 3 months    Recent Outpatient Visits           1 month ago Syncope, unspecified syncope type   [place], PA-C   2 months ago Type 2 diabetes mellitus with stage 4 chronic kidney disease, with long-term current use of insulin [facility])   [employer], [place], PA-C   6 months ago Atrial fibrillation, chronic [facility])   [place], [place], PA-C   6 months ago Cerebrovascular accident (CVA), unspecified mechanism [facility])   [facility] Birdie Sons, MD   9 months ago Congestive heart failure, unspecified HF chronicity, unspecified heart failure type [facility])   [facility] Trinna Post, PA-C       Future  Appointments             In 5 days Trinna Post, [place], [facility]

## 2020-10-19 ENCOUNTER — Telehealth: Payer: Self-pay

## 2020-10-19 ENCOUNTER — Telehealth (HOSPITAL_COMMUNITY): Payer: Self-pay | Admitting: Pharmacy Technician

## 2020-10-19 NOTE — Telephone Encounter (Addendum)
Received Uptravi assistance form from [employer]. Called and spoke with patient, she is going to come sign the application next week. Will be at the check in desk for her.  Will fax in once signatures are obtained.

## 2020-10-19 NOTE — Telephone Encounter (Signed)
Glenard Haring, an RN from [facility] called saying that she did a virtual visit with the patient this morning and noticed that she was more short of breath than usual. She reports that the patient is a part of a care management program and they have to log their vitals daily. She reports that her O2 levels were 88% today, and it usually averages between 94-96%. She has no appetite, denies fevers or chills. Denies cough. She only has shortness of breath.   Tried calling patient to triage, but no answer. Left a message to call back. She does have an appt with you next week (3/8) for a follow up. Just FYI.

## 2020-10-19 NOTE — Telephone Encounter (Signed)
Spoke to patient and she states that her oxygen level is currently a 94% and she was going to a urgent care to be tested for covid. Patient reports that she was not feeling well last night. Just a [place]

## 2020-10-19 NOTE — Telephone Encounter (Signed)
Can we call again to triage? No availability today and it sounds like she may need to be seen more urgently in the ER.

## 2020-10-23 ENCOUNTER — Encounter: Payer: Self-pay | Admitting: Physician Assistant

## 2020-10-23 ENCOUNTER — Ambulatory Visit (INDEPENDENT_AMBULATORY_CARE_PROVIDER_SITE_OTHER): Payer: Medicare Other | Admitting: Physician Assistant

## 2020-10-23 ENCOUNTER — Other Ambulatory Visit: Payer: Self-pay | Admitting: Physician Assistant

## 2020-10-23 ENCOUNTER — Telehealth: Payer: Self-pay

## 2020-10-23 ENCOUNTER — Other Ambulatory Visit: Payer: Self-pay

## 2020-10-23 VITALS — BP 122/57 | HR 58 | Temp 97.7°F | Wt 241.2 lb

## 2020-10-23 DIAGNOSIS — E119 Type 2 diabetes mellitus without complications: Secondary | ICD-10-CM | POA: Diagnosis not present

## 2020-10-23 LAB — POCT GLYCOSYLATED HEMOGLOBIN (HGB A1C)
Est. average glucose Bld gHb Est-mCnc: 171
Hemoglobin A1C: 7.6 % — AB (ref 4.0–5.6)

## 2020-10-23 MED ORDER — TOUJEO SOLOSTAR 300 UNIT/ML ~~LOC~~ SOPN: PEN_INJECTOR | SUBCUTANEOUS | 1 refills | Status: DC

## 2020-10-23 MED ORDER — SITAGLIPTIN PHOSPHATE 25 MG PO TABS: 25.0000 mg | ORAL_TABLET | Freq: Every morning | ORAL | 1 refills | Status: DC

## 2020-10-23 NOTE — Progress Notes (Signed)
Established patient visit   Patient: Ann Flynn   DOB: [birth year]   80 y.o. Female  MRN: [id number] Visit Date: 10/23/2020  Today's healthcare provider: Trinna Post, PA-C   Chief Complaint  Patient presents with  . Diabetes  I,Nakyiah Kuck M Amayra Kiedrowski,acting as a scribe for [employer], PA-C.,have documented all relevant documentation on the behalf of Trinna Post, PA-C,as directed by  Trinna Post, PA-C while in the presence of Trinna Post, PA-C.  Subjective    HPI  Diabetes Mellitus Type II, Follow-up  Lab Results  Component Value Date   HGBA1C 7.6 (A) 10/23/2020   HGBA1C 7.0 (A) 07/24/2020   HGBA1C 7.9 (H) 03/24/2020   Wt Readings from Last 3 Encounters:  10/23/20 241 lb 3.2 oz (109.4 kg)  10/03/20 240 lb (108.9 kg)  08/30/20 242 lb (109.8 kg)   Last seen for diabetes 3 months ago.  Management since then includes continue current medication. She reports good compliance with treatment. She is not having side effects.  Symptoms: No fatigue No foot ulcerations  No appetite changes No nausea  No paresthesia of the feet  No polydipsia  No polyuria No visual disturbances   No vomiting     Home blood sugar records: fasting range: 150's  Episodes of hypoglycemia? No    Current insulin regiment: Most Recent Eye Exam: UTD Current exercise: housecleaning and no regular exercise Current diet habits: well balanced  Pertinent Labs: Lab Results  Component Value Date   CHOL 104 01/18/2020   HDL 43 01/18/2020   LDLCALC 46 01/18/2020   TRIG 68 01/18/2020   CHOLHDL 2.4 01/18/2020   Lab Results  Component Value Date   NA 141 08/30/2020   K 4.2 08/30/2020   CREATININE 1.65 (H) 08/30/2020   GFRNONAA 31 (L) 08/30/2020   GFRAA 29 (L) 07/24/2020   GLUCOSE 112 (H) 08/30/2020     ---------------------------------------------------------------------------------------------------  Syncope: Improved since complete discontinuation of losartan. She is  also holding her metoprolol.   She reports she is having some memory issues. Will get her oxygen tank ready and then forget to turn it on. She is living independently and managing her bills. Still driving. Reports she did hire somebody to help with housework.     Medications: Outpatient Medications Prior to Visit  Medication Sig  . acetaminophen (TYLENOL) 650 MG CR tablet Take 650-1,300 mg by mouth every 8 (eight) hours as needed for pain.  [name] atorvastatin (LIPITOR) 80 MG tablet Take 1 tablet (80 mg total) by mouth daily.  . BD PEN NEEDLE NANO U/F 32G X 4 MM MISC USE TO INJECT ONCE DAILY AS DIRECTED  . Cholecalciferol (VITAMIN D3) 50 MCG (2000 UT) TABS Take 2,000 Units by mouth in the morning and at bedtime.  . docusate sodium (COLACE) 100 MG capsule Take 100 mg by mouth daily as needed (constipation.).   [name] empagliflozin (JARDIANCE) 10 MG TABS tablet Take 1 tablet (10 mg total) by mouth daily before breakfast.  . losartan (COZAAR) 50 MG tablet Take 50 mg by mouth daily. (Patient not taking: Reported on 10/23/2020)  . metoprolol succinate (TOPROL XL) 25 MG 24 hr tablet Take 1 tablet (25 mg total) by mouth every evening. (Patient not taking: Reported on 10/23/2020)  . omeprazole (PRILOSEC) 40 MG capsule Take 1 capsule (40 mg total) by mouth daily.  [name] ULTRA test strip USE AS DIRECTED  . tadalafil, PAH, (ADCIRCA) 20 MG tablet Take 2 tablets (40 mg  total) by mouth at bedtime.  . torsemide (DEMADEX) 20 MG tablet Take 1 tablet (20 mg total) by mouth daily.  [name] venlafaxine (EFFEXOR) 75 MG tablet Take 75 mg by mouth daily.   . vitamin B-12 (CYANOCOBALAMIN) 1000 MCG tablet Take 1,000 mcg by mouth in the morning and at bedtime.  [name] warfarin (COUMADIN) 4 MG tablet Take 4 mg on Sun, Mon, Tues, Thurs, Friday.  . [DISCONTINUED] insulin glargine, 1 Unit Dial, (TOUJEO SOLOSTAR) 300 UNIT/ML Solostar Pen INJECT 50 UNITS SUBCUTANEOUSLY ONCE DAILY  . [DISCONTINUED] sitaGLIPtin (JANUVIA) 25 MG tablet Take 1  tablet (25 mg total) by mouth every morning.   No facility-administered medications prior to visit.    Review of Systems  Constitutional: Negative.   Respiratory: Negative.   Cardiovascular: Negative.   Neurological: Negative.   Hematological: Negative.        Objective    BP (!) 122/57 (BP Location: Left Arm, Patient Position: Sitting, Cuff Size: Large)   Pulse (!) 58   Temp 97.7 F (36.5 C) (Oral)   Wt 241 lb 3.2 oz (109.4 kg)   SpO2 98%   BMI 40.14 kg/m     Physical Exam Constitutional:      Appearance: Normal appearance. She is obese.  Cardiovascular:     Rate and Rhythm: Normal rate and regular rhythm.  Pulmonary:     Effort: Pulmonary effort is normal.     Breath sounds: Normal breath sounds.  Skin:    General: Skin is warm and dry.  Neurological:     General: No focal deficit present.     Mental Status: She is alert and oriented to person, place, and time. Mental status is at baseline.  Psychiatric:        Mood and Affect: Mood normal.        Behavior: Behavior normal.       Results for orders placed or performed in visit on 10/23/20  POCT glycosylated hemoglobin (Hb A1C)  Result Value Ref Range   Hemoglobin A1C 7.6 (A) 4.0 - 5.6 %   HbA1c POC (<> result, manual entry)     HbA1c, POC (prediabetic range)     HbA1c, POC (controlled diabetic range)     Est. average glucose Bld gHb Est-mCnc 171     Assessment & Plan    1. Diabetes mellitus without complication ([place])  Do feel diabetes is fairly well controlled considering her age and comorbidities. However, patient would like it to be better controlled. Will try trulicity again which patient had discontinued previously due to cost. Will forward to our clinical pharmacist to see if she qualifies for drug assistance.   - POCT glycosylated hemoglobin (Hb A1C) - Dulaglutide (TRULICITY) 1.09 NA/3.5TD SOPN; Inject 0.75 mg into the skin once a week.  Dispense: 2 mL; Refill: 1   No follow-ups on file.       ITrinna Post, PA-C, have reviewed all documentation for this visit. The documentation on 10/24/20 for the exam, diagnosis, procedures, and orders are all accurate and complete.  The entirety of the information documented in the History of Present Illness, Review of Systems and Physical Exam were personally obtained by me. Portions of this information were initially documented by [facility] and reviewed by me for thoroughness and accuracy.   F/u 3 months chronic    Trinna Post, Hershal Coria  [facility] [phone] (phone) [phone] (fax)  [place]

## 2020-10-23 NOTE — Telephone Encounter (Signed)
Copied from [place] [id number]. Topic: Quick Communication - Rx Refill/Question >> Oct 23, 2020  3:57 PM Leward Quan A wrote: Medication: venlafaxine (EFFEXOR) 75 MG tablet, sitaGLIPtin (JANUVIA) 25 MG tablet, insulin glargine, 1 Unit Dial, (TOUJEO SOLOSTAR) 300 UNIT/ML Solostar Pen   Has the patient contacted their pharmacy? Yes.   (Agent: If no, request that the patient contact the pharmacy for the refill.) (Agent: If yes, when and what did the pharmacy advise?)  Preferred Pharmacy (with phone number or street name): [facility] - [place], [place] - [address]  Phone:  [phone] Fax:  [phone]     Agent: Please be advised that RX refills may take up to 3 business days. We ask that you follow-up with your pharmacy.

## 2020-10-23 NOTE — Telephone Encounter (Signed)
Requested medication (s) are due for refill today: Yes  Requested medication (s) are on the active medication list: Yes  Last refill:  05/03/20  Future visit scheduled: Yes  Notes to clinic:  Unable to refill per protocol, last refill by another provider.      Requested Prescriptions  Pending Prescriptions Disp Refills   venlafaxine (EFFEXOR) 75 MG tablet      Sig: Take 1 tablet (75 mg total) by mouth daily.      Psychiatry: Antidepressants - SNRI - desvenlafaxine & venlafaxine Failed - 10/23/2020  4:10 PM      Failed - LDL in normal range and within 360 days    LDL Chol Calc (NIH)  Date Value Ref Range Status  01/18/2020 46 0 - 99 mg/dL Final          Passed - Total Cholesterol in normal range and within 360 days    Cholesterol, Total  Date Value Ref Range Status  01/18/2020 104 100 - 199 mg/dL Final          Passed - Triglycerides in normal range and within 360 days    Triglycerides  Date Value Ref Range Status  01/18/2020 68 0 - 149 mg/dL Final          Passed - Last BP in normal range    BP Readings from Last 1 Encounters:  10/23/20 (!) 122/57          Passed - Valid encounter within last 6 months    Recent Outpatient Visits           Today Diabetes mellitus without complication [facility])   [place], Adriana M, PA-C   2 months ago Syncope, unspecified syncope type   [place], Adriana M, PA-C   3 months ago Type 2 diabetes mellitus with stage 4 chronic kidney disease, with long-term current use of insulin [facility])   [place], [place], PA-C   6 months ago Atrial fibrillation, chronic [facility])   [facility], [place], PA-C   6 months ago Cerebrovascular accident (CVA), unspecified mechanism [facility])   [place], Donald E, MD       Future Appointments             In 4 months Fisher, Kirstie Peri, MD [facility], [facility]               Signed Prescriptions Disp Refills   sitaGLIPtin (JANUVIA) 25 MG tablet 90 tablet 1    Sig: Take 1 tablet (25 mg total) by mouth every morning.      Endocrinology:  Diabetes - DPP-4 Inhibitors Failed - 10/23/2020  4:10 PM      Failed - Cr in normal range and within 360 days    Creatinine, Ser  Date Value Ref Range Status  08/30/2020 1.65 (H) 0.44 - 1.00 mg/dL Final          Passed - HBA1C is between 0 and 7.9 and within 180 days    Hemoglobin A1C  Date Value Ref Range Status  10/23/2020 7.6 (A) 4.0 - 5.6 % Final   Hgb A1c MFr Bld  Date Value Ref Range Status  03/24/2020 7.9 (H) 4.8 - 5.6 % Final    Comment:    (NOTE) Pre diabetes:          5.7%-6.4%  Diabetes:              >6.4%  Glycemic control  for   <7.0% adults with diabetes           Passed - Valid encounter within last 6 months    Recent Outpatient Visits           Today Diabetes mellitus without complication [facility])   [place], [place], [place]   2 months ago Syncope, unspecified syncope type   [place], [place], PA-C   3 months ago Type 2 diabetes mellitus with stage 4 chronic kidney disease, with long-term current use of insulin [facility])   [place], [place], PA-C   6 months ago Atrial fibrillation, chronic [facility])   [facility] [place], Fabio Bering M, PA-C   6 months ago Cerebrovascular accident (CVA), unspecified mechanism ([place])   [place], Kirstie Peri, MD       Future Appointments             In 4 months Fisher, Kirstie Peri, MD [employer], [facility]               insulin glargine, 1 Unit Dial, (TOUJEO SOLOSTAR) 300 UNIT/ML Solostar Pen 4.5 mL 1    Sig: INJECT [place]      Endocrinology:  Diabetes - Insulins Passed - 10/23/2020  4:10 PM      Passed - HBA1C is between 0 and 7.9 and within 180 days    Hemoglobin A1C  Date Value Ref Range Status  10/23/2020  7.6 (A) 4.0 - 5.6 % Final   Hgb A1c MFr Bld  Date Value Ref Range Status  03/24/2020 7.9 (H) 4.8 - 5.6 % Final    Comment:    (NOTE) Pre diabetes:          5.7%-6.4%  Diabetes:              >6.4%  Glycemic control for   <7.0% adults with diabetes           Passed - Valid encounter within last 6 months    Recent Outpatient Visits           Today Diabetes mellitus without complication [facility])   [place], Adriana M, PA-C   2 months ago Syncope, unspecified syncope type   [place], PA-C   3 months ago Type 2 diabetes mellitus with stage 4 chronic kidney disease, with long-term current use of insulin [facility])   [place], [place], PA-C   6 months ago Atrial fibrillation, chronic [facility])   [place], PA-C   6 months ago Cerebrovascular accident (CVA), unspecified mechanism [facility])   [place], Kirstie Peri, MD       Future Appointments             In 4 months Fisher, Kirstie Peri, MD [facility], [place]

## 2020-10-23 NOTE — Progress Notes (Signed)
Chronic Care Management Pharmacy Assistant   Name: Ann Flynn  MRN: [id number] DOB: [birth year]   Reason for Encounter: Medication Review   Primary concerns for visit include: Dispensing Monthly Call.   Recent office visits:  10/23/2020 PCP Bari Edward   Recent consult visits:  10/03/2020 Cardiology Meriel Pica visits:  Medication Reconciliation was completed by comparing discharge summary, patient's EMR and Pharmacy list, and upon discussion with patient.  Admitted to the hospital on 07/26/2020 due to Syncope, Bradycardia. Discharge date was 08/09/2020.  Medications that remain the same after Hospital Discharge:??  -All other medications will remain the same.    Medications: Outpatient Encounter Medications as of 10/23/2020  Medication Sig  . acetaminophen (TYLENOL) 650 MG CR tablet Take 650-1,300 mg by mouth every 8 (eight) hours as needed for pain.  [name] atorvastatin (LIPITOR) 80 MG tablet Take 1 tablet (80 mg total) by mouth daily.  . BD PEN NEEDLE NANO U/F 32G X 4 MM MISC USE TO INJECT ONCE DAILY AS DIRECTED  . Cholecalciferol (VITAMIN D3) 50 MCG (2000 UT) TABS Take 2,000 Units by mouth in the morning and at bedtime.  . docusate sodium (COLACE) 100 MG capsule Take 100 mg by mouth daily as needed (constipation.).   [name] empagliflozin (JARDIANCE) 10 MG TABS tablet Take 1 tablet (10 mg total) by mouth daily before breakfast.  . insulin glargine, 1 Unit Dial, (TOUJEO SOLOSTAR) 300 UNIT/ML Solostar Pen INJECT 50 UNITS SUBCUTANEOUSLY ONCE DAILY  . losartan (COZAAR) 50 MG tablet Take 50 mg by mouth daily. (Patient not taking: Reported on 10/23/2020)  . metoprolol succinate (TOPROL XL) 25 MG 24 hr tablet Take 1 tablet (25 mg total) by mouth every evening. (Patient not taking: Reported on 10/23/2020)  . omeprazole (PRILOSEC) 40 MG capsule Take 1 capsule (40 mg total) by mouth daily.  [name] ULTRA test strip USE AS DIRECTED  . sitaGLIPtin (JANUVIA) 25 MG tablet  Take 1 tablet (25 mg total) by mouth every morning.  . tadalafil, PAH, (ADCIRCA) 20 MG tablet Take 2 tablets (40 mg total) by mouth at bedtime.  . torsemide (DEMADEX) 20 MG tablet Take 1 tablet (20 mg total) by mouth daily.  [name] venlafaxine (EFFEXOR) 75 MG tablet Take 75 mg by mouth daily.   . vitamin B-12 (CYANOCOBALAMIN) 1000 MCG tablet Take 1,000 mcg by mouth in the morning and at bedtime.  [name] warfarin (COUMADIN) 4 MG tablet Take 4 mg on Sun, Mon, Tues, Thurs, Friday.   No facility-administered encounter medications on file as of 10/23/2020.      Star Rating Drugs: Atorvastatin 80 mg tablet,losartan 50 mg tablet,   Reviewed chart for medication changes ahead of medication coordination call.   BP Readings from Last 3 Encounters:  10/23/20 (!) 122/57  10/03/20 140/60  08/30/20 132/70    Lab Results  Component Value Date   HGBA1C 7.6 (A) 10/23/2020     Patient obtains medications through Adherence Packaging  30 Days   Last adherence delivery included:     Atorvastatin 80 mg one tablet daily -Bedtime              Januvia 25 mg one tablet daily -Breakfast              Venlafaxine 75 mg one tablet daily -Breakfast              Toujeo U300 Inject 45 units into the skin daily  Jardiance 10 mgDaily- Breakfast             Warfarin 4 mgDaily- Vials             Omeprazole 40 mgDaily - Breakfast             Torsemide 20 mg Daily - Breakfast             BD Pen Needle Nano - Daily   Patient declined medications last month:    Losartan 25 mg one tablet daily -Bedtime (Nephrology Harmeet Singh hold losartan)              Metoprolol Tartrate 25 mg one tablet twice daily (Patient states she stopping taking about a month ago due to "drops in blood pressure"             Warfarin 3 mgDaily - Vials (adequate supply PRN)  Patient is due for next adherence delivery on: 10/30/2020. Called patient and reviewed medications and coordinated delivery.   This delivery to  include:     Atorvastatin 80 mg one tablet daily -Bedtime              Januvia 25 mg one tablet daily -Breakfast              Venlafaxine 75 mg one tablet daily -Breakfast              Toujeo U300 Inject 45 units into the skin daily             Jardiance 10 mg one tabletDaily- Breakfast   Omeprazole 40 mg one capsuleDaily - Breakfast             Torsemide 20 mg one tablet  Daily - Breakfast    Metoprolol Tartrate 25 mg one tablet daily - breakfast Patient will not need a short fill of medication, prior to adherence delivery.   Coordinated acute fill for  Warfarin Take 4 mg on Sun, Mon, Tues, Thurs, Friday to be delivered 10/24/2020. Completed Acute form and sent to clinical pharmacist for review.   Patient declined the following medications:    Losartan 25 mg one tablet daily -Bedtime (Nephrology Harmeet Singh hold losartan)          Warfarin 3 mgDaily - Vials (adequate supply PRN)     BD Pen Needle Nano - Daily (adequate supply,received  90DS on 09/28/2020)   Patient needs refills for: Venlafaxine, Toujeo, Januvia, reach out to PCP to request refills on 10/23/2020.  Confirmed delivery date of 10/30/2020,  advised patient that pharmacy will contact them the morning of delivery.  [place] Pharmacist Assistant [phone]

## 2020-10-24 MED ORDER — TRULICITY 0.75 MG/0.5ML ~~LOC~~ SOAJ: 0.7500 mg | SUBCUTANEOUS | 1 refills | Status: DC

## 2020-10-24 NOTE — Progress Notes (Signed)
We will start patient assistance for Ms. Cocker' Trulicity! Thank you for letting me know.   [place] [phone]

## 2020-10-25 ENCOUNTER — Telehealth: Payer: Self-pay

## 2020-10-25 IMAGING — CT CT CHEST W/O CM
2 of 4 series · 15 of 36 positions shown, 18 images · non-contrast
Comparison: Chest CT without contrast 06/28/2019.

CLINICAL DATA: 78-year-old female with progressive chronic
shortness of breath. Pulmonary nodules and/or scarring on CT in
[REDACTED] with follow-up recommended.

EXAM:
CT CHEST WITHOUT CONTRAST
TECHNIQUE: Multidetector CT imaging of the chest was performed following the
standard protocol without IV contrast.

[Series 2: chest 2.00 · axial · 0.68mm/px · z∈[-1305,-1019]mm · 12 of 171 slices shown, 15 images]
[im 14/171  mediastinal]
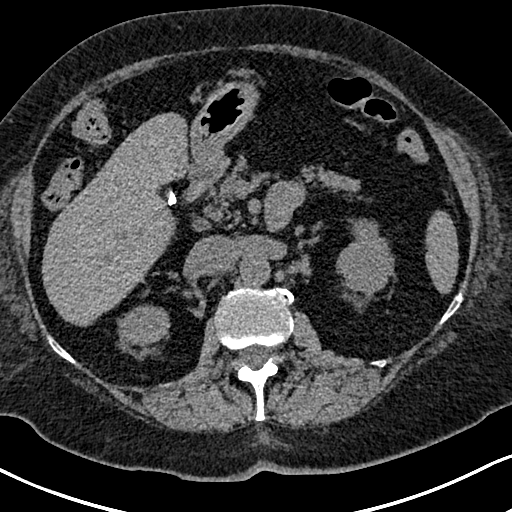
[im 14/171  lung]
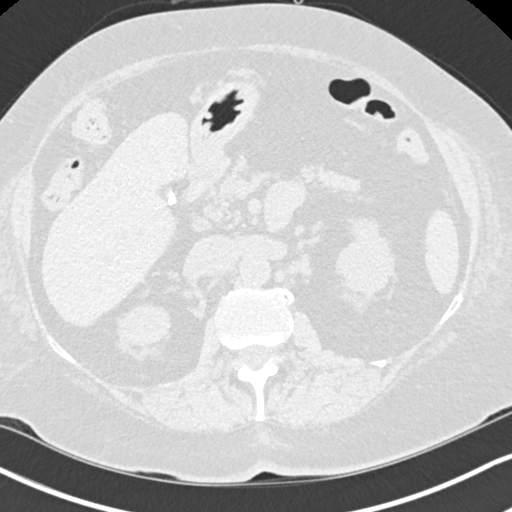
[im 27/171  lung]
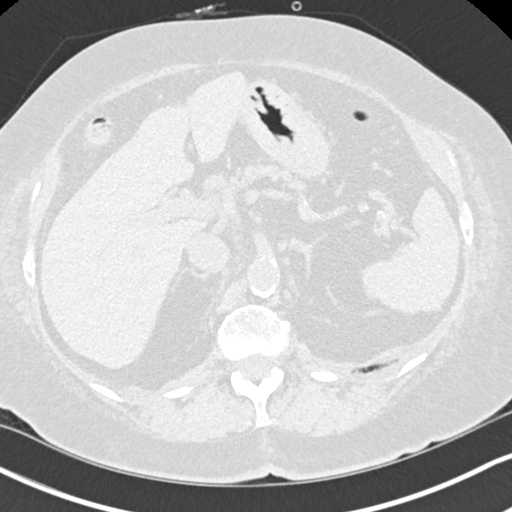
[im 40/171  lung]
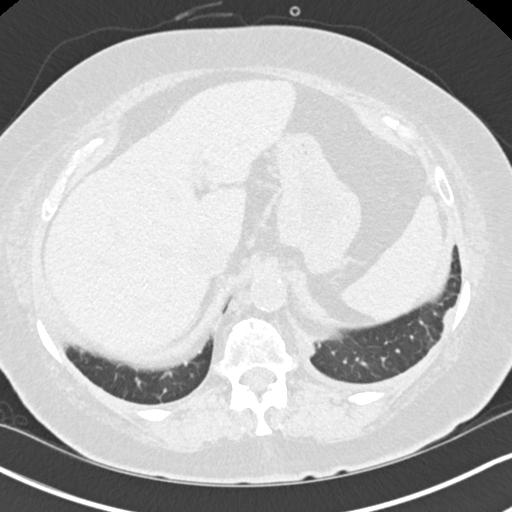
[im 53/171  lung]
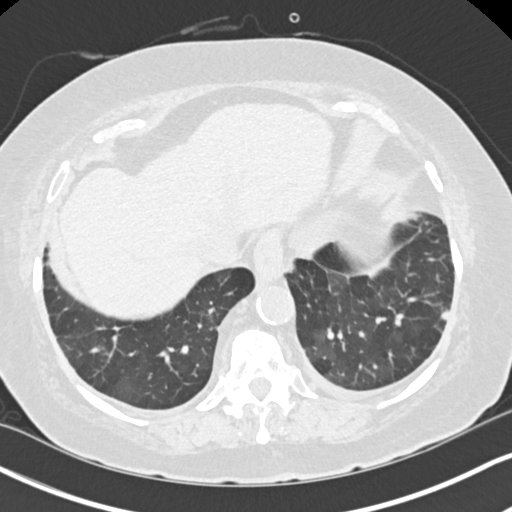
[im 66/171  mediastinal]
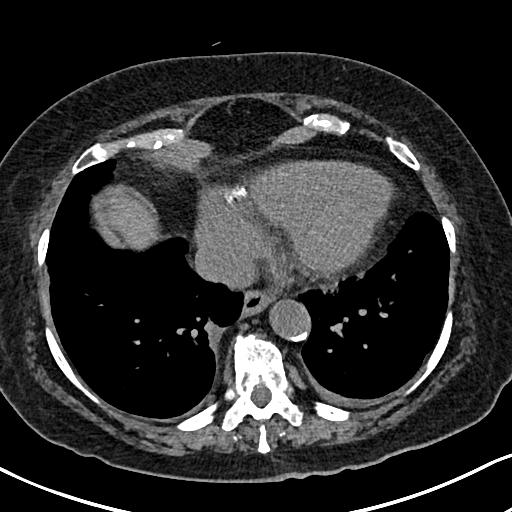
[im 66/171  lung]
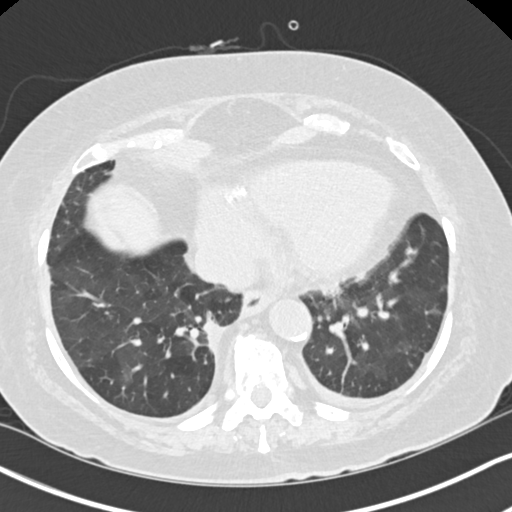
[im 79/171  lung]
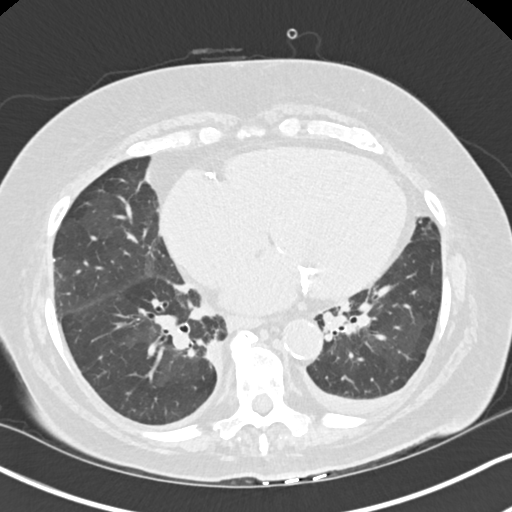
[im 92/171  lung]
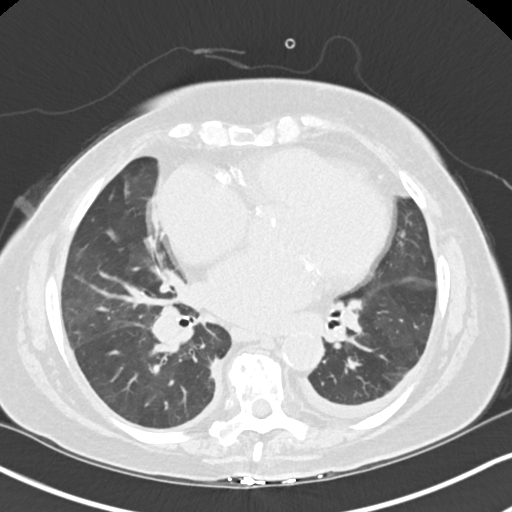
[im 105/171  lung]
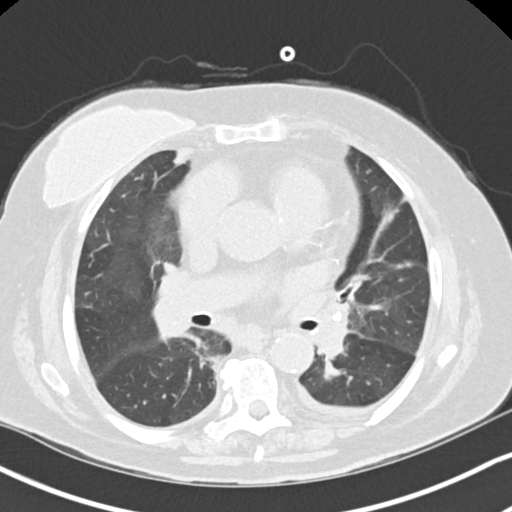
[im 118/171  mediastinal]
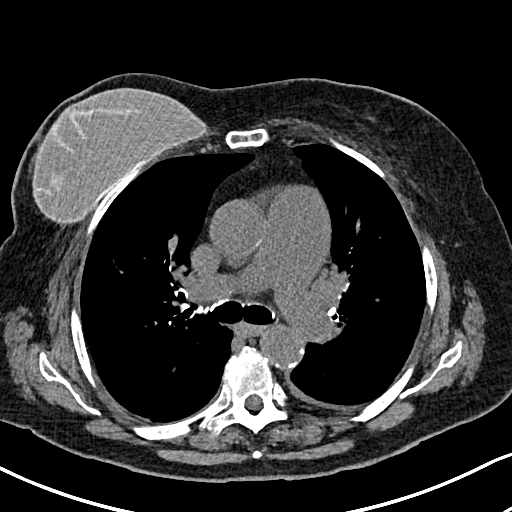
[im 118/171  lung]
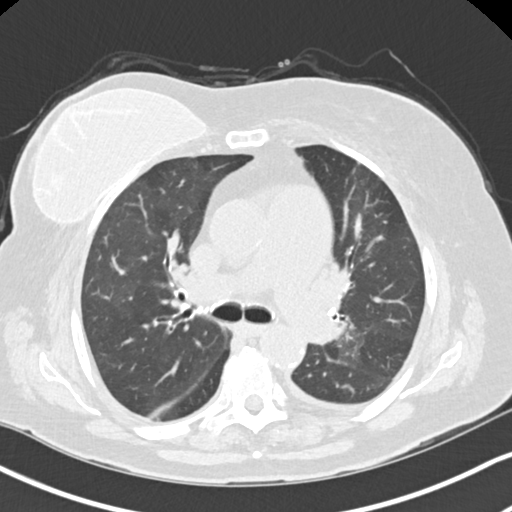
[im 131/171  lung]
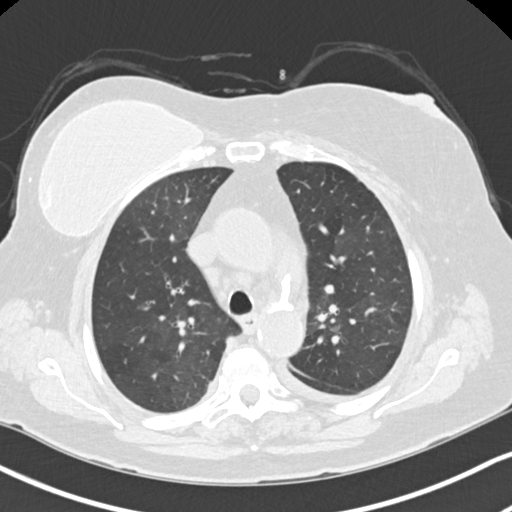
[im 144/171  lung]
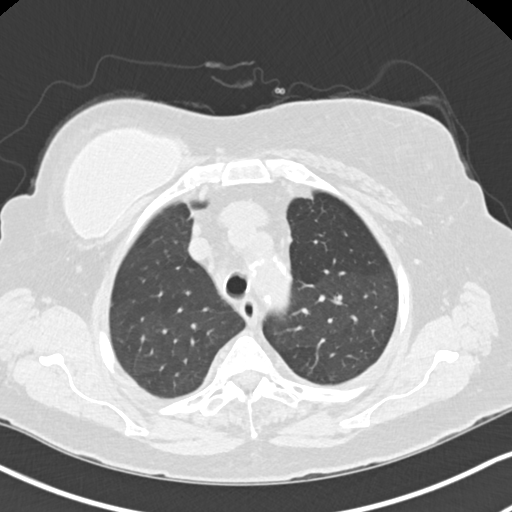
[im 157/171  lung]
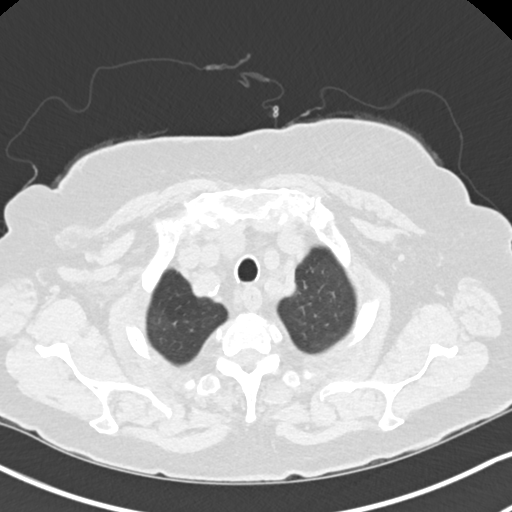

[Series 5: coronals chest 2.00 cor · coronal · 0.67mm/px · 3 of 143 slices shown]
[im 29/143  lung]
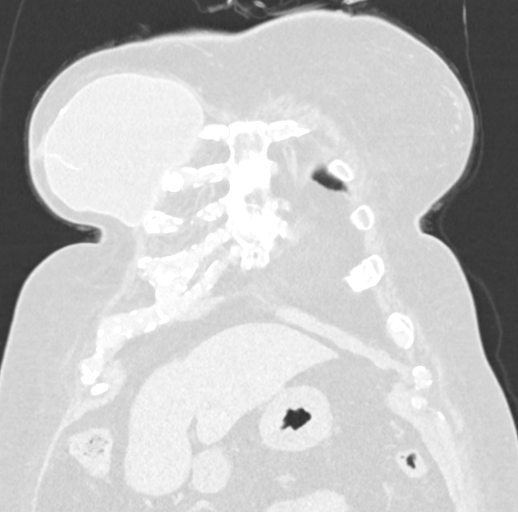
[im 57/143  lung]
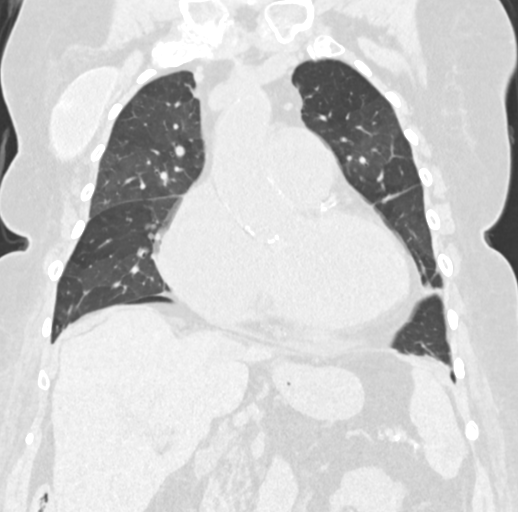
[im 86/143  lung]
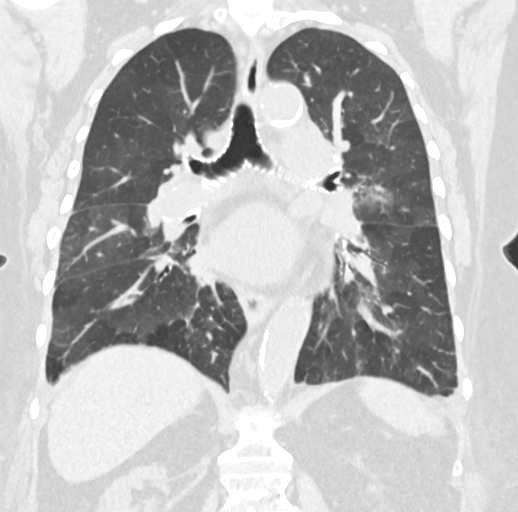

[15 of 36 positions shown; findings below may reference images not displayed]

FINDINGS: Cardiovascular: Calcified aortic atherosclerosis. Extensive
calcified coronary artery atherosclerosis again noted. Vascular
patency is not evaluated in the absence of IV contrast. Stable
cardiomegaly. No pericardial effusion.

Chronic enlargement of the central pulmonary arteries (up to 45 mm
diameter main pulmonary artery as compared to 32 mm diameter
adjacent ascending aorta).

Mediastinum/Nodes: Numerous small and calcified mediastinal and
bilateral hilar lymph nodes. Stable increased number of small
mediastinal nodes.

No axillary lymphadenopathy. No lymphadenopathy is evident at the
visible thoracic inlet.

Lungs/Pleura: A trace left pleural effusion is now superimposed on
the trace fluid or pleural thickening present in [REDACTED]. Trace
right pleural fluid also.

Major airways are patent.

Multiple calcified granulomas in both lungs, with superimposed areas
of mild architectural distortion (lingula) and underlying bilateral
mosaic attenuation.

The largest lesion in the medial right lower lobe which is partially
calcified (series 3, image 102) appears stable. Small noncalcified
lung nodules in the left upper lobe (series 3, image 57), left lower
lobe (subpleural nodule series 3, image 119), and right upper lobe
(image 36) remain stable.

Upper Abdomen: There are occasional calcified granulomas in the
liver and spleen. Surgically absent gallbladder.

The 17 mm round low-density area in the spleen appears unchanged on
series 2, image 152, favoring benign etiology. Negative visible
adrenal glands and bowel. There is some chronic renal scarring
suspected and stable.

Musculoskeletal: Sequelae of right side mastectomy and
reconstruction. Degenerative osseous changes in the thoracic spine
and at the right sternoclavicular joint. Chronic left lateral 8th
and 9th rib fractures. No acute osseous abnormality identified.
IMPRESSION: 1. Stable bilateral pulmonary nodules seen in [REDACTED], superimposed
on sequelae of chronic granulomatous disease, bilateral lung
scarring, and evidence of chronic Pulmonary Artery Hypertension
which likely explains underlying pulmonary mosaic attenuation on the
basis of pulmonary small vessel disease.

One additional follow-up Chest CT in [REDACTED] or June 2021 (18-24
months from the initial scan) is optional for low-risk
patients, but is recommended for high-risk patients (Guidelines for
Management of Incidental Pulmonary Nodules Detected on CT Images:

2. Trace bilateral pleural effusions are new.

3. Superimposed Cardiomegaly with advanced calcified coronary artery
and Aortic Atherosclerosis ([id number]).

4. Stable visible upper abdominal viscera including a probably
benign solitary small cystic area in the spleen.

## 2020-10-25 NOTE — Progress Notes (Addendum)
Spoke with patient to see if she wanted to add Trulicity to her delivery on 10/30/2020. Patient states it depends on how much it is. Per [facility] patient co-pay is 236.92.  Patient states she can not afford this and does not want to add this to her delivery. Notified Clinical Pharmacist.  Bessie Wales[place] Pharmacist Assistant [phone]    Addendum 11/07/20: Patient given one-time voucher for 03-JQD supply of Trulicity while patient assistance approval is in progress. Patient notified.   [place] [phone]

## 2020-10-26 ENCOUNTER — Encounter (HOSPITAL_COMMUNITY): Payer: Self-pay

## 2020-10-26 NOTE — Chronic Care Management (AMB) (Signed)
Chronic Care Management Pharmacy Assistant   Name: Jeanine Caven  MRN: [id number] DOB: 05/06/1941  Reason for Encounter: Patient Assistance Coordination  10/26/2020- Patient assistance forms filled out for Trulicity with [place] patient assistance program. Patient was notified by Winnifred Friar, CPA that forms will be mailed. Highlighting and tagging all information patient needs to complete for application.    Medications: Outpatient Encounter Medications as of 10/25/2020  Medication Sig  . acetaminophen (TYLENOL) 650 MG CR tablet Take 650-1,300 mg by mouth every 8 (eight) hours as needed for pain.  [name] atorvastatin (LIPITOR) 80 MG tablet Take 1 tablet (80 mg total) by mouth daily.  . BD PEN NEEDLE NANO U/F 32G X 4 MM MISC USE TO INJECT ONCE DAILY AS DIRECTED  . Cholecalciferol (VITAMIN D3) 50 MCG (2000 UT) TABS Take 2,000 Units by mouth in the morning and at bedtime.  . docusate sodium (COLACE) 100 MG capsule Take 100 mg by mouth daily as needed (constipation.).   [name] Dulaglutide (TRULICITY) 6.80 HO/1.2YQ SOPN Inject 0.75 mg into the skin once a week.  . empagliflozin (JARDIANCE) 10 MG TABS tablet Take 1 tablet (10 mg total) by mouth daily before breakfast.  . insulin glargine, 1 Unit Dial, (TOUJEO SOLOSTAR) 300 UNIT/ML Solostar Pen INJECT 50 UNITS SUBCUTANEOUSLY ONCE DAILY  . losartan (COZAAR) 50 MG tablet Take 50 mg by mouth daily. (Patient not taking: Reported on 10/23/2020)  . metoprolol succinate (TOPROL XL) 25 MG 24 hr tablet Take 1 tablet (25 mg total) by mouth every evening. (Patient not taking: Reported on 10/23/2020)  . omeprazole (PRILOSEC) 40 MG capsule Take 1 capsule (40 mg total) by mouth daily.  [name] ULTRA test strip USE AS DIRECTED  . sitaGLIPtin (JANUVIA) 25 MG tablet Take 1 tablet (25 mg total) by mouth every morning.  . tadalafil, PAH, (ADCIRCA) 20 MG tablet Take 2 tablets (40 mg total) by mouth at bedtime.  . torsemide (DEMADEX) 20 MG tablet Take 1 tablet (20 mg  total) by mouth daily.  [name] venlafaxine (EFFEXOR) 75 MG tablet Take 75 mg by mouth daily.   . vitamin B-12 (CYANOCOBALAMIN) 1000 MCG tablet Take 1,000 mcg by mouth in the morning and at bedtime.  [name] warfarin (COUMADIN) 4 MG tablet Take 4 mg on Sun, Mon, Tues, Thurs, Friday.   No facility-administered encounter medications on file as of 10/25/2020.     SIG: Pattricia Boss, [place] Pharmacist Assistant [phone]

## 2020-10-26 NOTE — Telephone Encounter (Signed)
Sent in application via fax.  Will follow up.  

## 2020-10-27 ENCOUNTER — Other Ambulatory Visit: Payer: Self-pay | Admitting: Physician Assistant

## 2020-10-27 NOTE — Telephone Encounter (Signed)
Requested medication (s) are due for refill today: -  Requested medication (s) are on the active medication list: historical med  Last refill:  05/03/20  Future visit scheduled: yes  Notes to clinic:  historical med/provider   Requested Prescriptions  Pending Prescriptions Disp Refills   venlafaxine (EFFEXOR) 75 MG tablet [Pharmacy Med Name: venlafaxine 75 mg tablet] 90 tablet 5    Sig: [place]      Psychiatry: Antidepressants - SNRI - desvenlafaxine & venlafaxine Failed - 10/27/2020 11:10 AM      Failed - LDL in normal range and within 360 days    LDL Chol Calc (NIH)  Date Value Ref Range Status  01/18/2020 46 0 - 99 mg/dL Final          Passed - Total Cholesterol in normal range and within 360 days    Cholesterol, Total  Date Value Ref Range Status  01/18/2020 104 100 - 199 mg/dL Final          Passed - Triglycerides in normal range and within 360 days    Triglycerides  Date Value Ref Range Status  01/18/2020 68 0 - 149 mg/dL Final          Passed - Last BP in normal range    BP Readings from Last 1 Encounters:  10/23/20 (!) 122/57          Passed - Valid encounter within last 6 months    Recent Outpatient Visits           4 days ago Diabetes mellitus without complication [facility])   [place], Adriana M, PA-C   2 months ago Syncope, unspecified syncope type   [place], Adriana M, PA-C   3 months ago Type 2 diabetes mellitus with stage 4 chronic kidney disease, with long-term current use of insulin [facility])   [place], [place], PA-C   6 months ago Atrial fibrillation, chronic [facility])   [place], [place], PA-C   6 months ago Cerebrovascular accident (CVA), unspecified mechanism [facility])   [place], Kirstie Peri, MD       Future Appointments             In 3 months Fisher, Kirstie Peri, MD [facility],  [place]

## 2020-10-29 ENCOUNTER — Telehealth: Payer: Self-pay

## 2020-10-29 NOTE — Progress Notes (Signed)
Reached out to PCP to request a refill for Effexor 75 mg on 10/29/2020. Per PCP office message has been sent to provider and it can take up to three days for a response.  [place] Pharmacist Assistant [phone]

## 2020-10-29 NOTE — Telephone Encounter (Signed)
It looks like you have not ever filled this for her

## 2020-10-29 NOTE — Telephone Encounter (Signed)
[facility] calling to check on this medication refill. Please advise.

## 2020-11-01 ENCOUNTER — Other Ambulatory Visit: Payer: Self-pay | Admitting: Physician Assistant

## 2020-11-01 DIAGNOSIS — F32A Depression, unspecified: Secondary | ICD-10-CM

## 2020-11-01 MED ORDER — VENLAFAXINE HCL 75 MG PO TABS
75.0000 mg | ORAL_TABLET | Freq: Every day | ORAL | 0 refills | Status: DC
Start: 2020-11-01 — End: 2020-12-25

## 2020-11-06 ENCOUNTER — Telehealth: Payer: Self-pay

## 2020-11-06 NOTE — Telephone Encounter (Signed)
Patient came by the office to drop papers off for help with Trulicity.   She said her provider was Fabio Bering but now it's going to be Dr. Caryn Section.    I am sending the papers back for completion.

## 2020-11-13 ENCOUNTER — Telehealth (HOSPITAL_COMMUNITY): Payer: Self-pay | Admitting: Pharmacy Technician

## 2020-11-13 ENCOUNTER — Other Ambulatory Visit: Payer: Self-pay | Admitting: Physician Assistant

## 2020-11-13 DIAGNOSIS — I482 Chronic atrial fibrillation, unspecified: Secondary | ICD-10-CM

## 2020-11-13 NOTE — Telephone Encounter (Signed)
Requested medication (s) are due for refill today: yes  Requested medication (s) are on the active medication list: yes  Last refill:  10/24/20  Future visit scheduled: yes  Notes to clinic: pharmacy note, "Patient says she is now taking this daily.  May we have a new RX that reflects these directions please"    Requested Prescriptions  Pending Prescriptions Disp Refills   warfarin (COUMADIN) 4 MG tablet [Pharmacy Med Name: warfarin 4 mg tablet] 30 tablet 1    Sig: TAKE ONE TABLET BY MOUTH ON SUNDAY, MONDAY, ONCE WEEKLY ON [place], [place], Friday.      Hematology:  Anticoagulants - warfarin Failed - 11/13/2020  8:05 AM      Failed - This refill cannot be delegated      Failed - If the patient is managed by Coumadin Clinic - route to their Pool. If not, forward to the provider.      Failed - INR in normal range and within 30 days    INR  Date Value Ref Range Status  08/29/2020 2.7 (H) 0.9 - 1.2 Final    Comment:    Reference interval is for non-anticoagulated patients. Suggested INR therapeutic range for Vitamin K antagonist therapy:    Standard Dose (moderate intensity                   therapeutic range):       2.0 - 3.0    Higher intensity therapeutic range       2.5 - 3.5           Passed - Valid encounter within last 3 months    Recent Outpatient Visits           3 weeks ago Diabetes mellitus without complication [facility])   [place], Kimberly, PA-C   2 months ago Syncope, unspecified syncope type   [place], [place], PA-C   3 months ago Type 2 diabetes mellitus with stage 4 chronic kidney disease, with long-term current use of insulin [facility])   [place], [place], PA-C   6 months ago Atrial fibrillation, chronic [facility])   [place], PA-C   7 months ago Cerebrovascular accident (CVA), unspecified mechanism [facility])   [place], Kirstie Peri,  MD       Future Appointments             In 3 months Fisher, Kirstie Peri, MD [facility], [place]

## 2020-11-13 NOTE — Telephone Encounter (Signed)
Advanced Heart Failure Patient Advocate Encounter   Patient was approved to receive Uptravi from [employer]  Patient ID: [id number] Effective dates: 10/29/20 through 12/28/20  In order for the patient to be approved through the remainder of 2022, she would have to sign a Medicare Attestation form.   Called and spoke with the patient. She stated that she sent in a form to [employer] already. Will call [employer] to confirm if it has been received. If not, will get her to sign this as well.

## 2020-11-14 NOTE — Telephone Encounter (Signed)
Called [employer] who received the patient's attestation this morning. Should get an approval for the remainder of the year at some point this week.  Called and left the patient message.   [name], CPhT

## 2020-11-16 ENCOUNTER — Telehealth (HOSPITAL_COMMUNITY): Payer: Self-pay | Admitting: Pharmacy Technician

## 2020-11-16 NOTE — Telephone Encounter (Signed)
Advanced Heart Failure Patient Advocate Encounter   Patient was approved to receive Uptravi from [employer]  Patient ID: [id number] Effective dates: 11/14/20 through 08/17/21  Called and left the patient message.   [name], CPhT

## 2020-11-19 ENCOUNTER — Telehealth: Payer: Self-pay

## 2020-11-19 NOTE — Telephone Encounter (Signed)
Please advise

## 2020-11-19 NOTE — Telephone Encounter (Signed)
Recommend OTC diclofenac gel. Need office visit to evaluate if that is not effective.

## 2020-11-19 NOTE — Telephone Encounter (Signed)
Patient advised and verbalized understanding.

## 2020-11-19 NOTE — Telephone Encounter (Signed)
Copied from [place] [phone]. Topic: General - Other >> Nov 19, 2020 12:26 PM Pawl[place], Brayton Layman A wrote: Reason for CRM: Pt stated she is having bad arthritis pain, pt wanted to know if there was anything that could be sent in for her.

## 2020-11-20 ENCOUNTER — Telehealth: Payer: Self-pay

## 2020-11-20 NOTE — Telephone Encounter (Signed)
Copied from [place] [phone]. Topic: General - Other >> Nov 20, 2020 10:57 AM Yvette Rack wrote: Reason for CRM: Pt stated the pharmacy did not have the ointment or gel that was prescribed but she was able to get another ointment and it is not working. Pt requests a prescription for some sort of pain pill to be sent to [facility]

## 2020-11-20 NOTE — Telephone Encounter (Signed)
Ok, she has only used voltaren for one day per notes. It takes longer to work. May take up to 7-10 days. Per Dr. Maralyn Sago message yesterday, if not effective she needs to be evaluated.

## 2020-11-21 NOTE — Telephone Encounter (Signed)
Patient was advised and scheduled appointment for 11/30/20 @ 1:00 PM.

## 2020-11-22 ENCOUNTER — Telehealth: Payer: Self-pay

## 2020-11-22 NOTE — Telephone Encounter (Signed)
Copied from [place] [id number]. Topic: Appointment Scheduling - Scheduling Inquiry for Clinic >> Nov 22, 2020  1:54 PM Pawlus, Apolonio Schneiders wrote: Reason for CRM: Pt stated her arithis is painful and wanted to be seen sooner than 4/15, please advise if she can be worked in, pt stated she would see any provider.

## 2020-11-22 NOTE — Progress Notes (Addendum)
Chronic Care Management Pharmacy Assistant   Name: Ann Flynn  MRN: [id number] DOB: 1940/09/02  Reason for Encounter: Medication Review/medication coordination call.   Recent office visits:  No recent Office Visit  Recent consult visits:  No recent consult Visit  Hospital visits:  None in previous 6 months  Medications: Outpatient Encounter Medications as of 11/22/2020  Medication Sig   acetaminophen (TYLENOL) 650 MG CR tablet Take 650-1,300 mg by mouth every 8 (eight) hours as needed for pain.   atorvastatin (LIPITOR) 80 MG tablet Take 1 tablet (80 mg total) by mouth daily.   BD PEN NEEDLE NANO U/F 32G X 4 MM MISC USE TO INJECT ONCE DAILY AS DIRECTED   Cholecalciferol (VITAMIN D3) 50 MCG (2000 UT) TABS Take 2,000 Units by mouth in the morning and at bedtime.   docusate sodium (COLACE) 100 MG capsule Take 100 mg by mouth daily as needed (constipation.).    Dulaglutide (TRULICITY) 7.10 GY/6.9SW SOPN Inject 0.75 mg into the skin once a week.   empagliflozin (JARDIANCE) 10 MG TABS tablet Take 1 tablet (10 mg total) by mouth daily before breakfast.   insulin glargine, 1 Unit Dial, (TOUJEO SOLOSTAR) 300 UNIT/ML Solostar Pen INJECT 50 UNITS SUBCUTANEOUSLY ONCE DAILY   losartan (COZAAR) 50 MG tablet Take 50 mg by mouth daily. (Patient not taking: Reported on 10/23/2020)   metoprolol succinate (TOPROL XL) 25 MG 24 hr tablet Take 1 tablet (25 mg total) by mouth every evening. (Patient not taking: Reported on 10/23/2020)   omeprazole (PRILOSEC) 40 MG capsule Take 1 capsule (40 mg total) by mouth daily.   ONETOUCH ULTRA test strip USE AS DIRECTED   sitaGLIPtin (JANUVIA) 25 MG tablet Take 1 tablet (25 mg total) by mouth every morning.   tadalafil, PAH, (ADCIRCA) 20 MG tablet Take 2 tablets (40 mg total) by mouth at bedtime.   torsemide (DEMADEX) 20 MG tablet Take 1 tablet (20 mg total) by mouth daily.   venlafaxine (EFFEXOR) 75 MG tablet Take 1 tablet (75 mg total) by mouth daily.   vitamin  B-12 (CYANOCOBALAMIN) 1000 MCG tablet Take 1,000 mcg by mouth in the morning and at bedtime.   warfarin (COUMADIN) 4 MG tablet TAKE ONE TABLET BY MOUTH ON SUNDAY, MONDAY, ONCE WEEKLY ON TUESDAY, THURSDAY, Friday.   No facility-administered encounter medications on file as of 11/22/2020.   [facility] Rating Drugs: Atorvastatin 80 mg last filled on 10/27/2020 for 30 day supply at [facility]. Jardiance 10 mg last filled on 10/27/2020 for 30 day supply at [facility]. Trulicity 5.46 mg last filled on 10/27/2020 for 28 day supply at [facility]. Losartan 50 mg last filled on 08/29/2020 [facility]  Januvia 25 mg last filled on 10/27/2020 for 30 day supply at [facility].  Reviewed chart for medication changes ahead of medication coordination call.  BP Readings from Last 3 Encounters:  10/23/20 (!) 122/57  10/03/20 140/60  08/30/20 132/70    Lab Results  Component Value Date   HGBA1C 7.6 (A) 10/23/2020     Patient obtains medications through Adherence Packaging  30 Days   Last adherence delivery included:  Atorvastatin 80 mg one tablet daily - Bedtime  Januvia 25 mg one tablet daily - Breakfast  Venlafaxine 75 mg one tablet daily - Breakfast   Toujeo U300 Inject 45 units into the skin daily  Jardiance 10 mg one tablet  Daily- Breakfast Omeprazole 40 mg one capsule Daily - Breakfast Torsemide 20 mg one  tablet  Daily -  Breakfast Metoprolol Tartrate 25 mg one tablet daily - breakfast  Coordinated acute fill for  Warfarin Take 4 mg on Sun, Mon, Tues, Thurs, Friday  to be delivered 10/24/2020. Completed Acute form and sent to clinical pharmacist for review  Patient declined medication last month:  Losartan 25 mg one tablet daily - Bedtime (Nephrology [facility] Singh hold losartan) Warfarin 3 mg Daily - Vials (adequate supply PRN) BD Pen Needle Nano - Daily (adequate supply,received  90DS on 09/28/2020)       Patient is due for next adherence  delivery on: 11/29/2020. Called patient and reviewed medications and coordinated delivery.  This delivery to include: Atorvastatin 80 mg one tablet daily - Bedtime  Venlafaxine 75 mg one tablet daily - Breakfast   Toujeo U300 Inject 45 units into the skin daily  Jardiance 10 mg one tablet  Daily- Breakfast Omeprazole 40 mg one capsule Daily - Breakfast Torsemide 20 mg one tablet  Daily -  Breakfast Metoprolol Tartrate 25 mg one tablet daily - breakfast  Coordinated acute fill for Warfarin 4 mg Take 4 mg on Sun, Mon, Tues, Thurs, Friday  to be delivered  11/20/2020.  Patient declined the following medications:  Losartan 25 mg one tablet daily - Bedtime (Nephrology [facility] hold losartan) Warfarin 3 mg Daily - Vials (Not taking) BD Pen Needle Nano - Daily (adequate supply,received  90DS on 26/37/8588)  Trulicity 5.02DX Inject 0.75 mg into the skin once a week (patient receiving Trulicity  from [facility])  Patient needs refills for None ID.  Confirmed delivery date of 11/29/2020, advised patient that pharmacy will contact them the morning of delivery.  Patient states she was approve for Trulicity 4.12IN and receive four boxes of Trulicity.  Blood pressure readings: Patient states her blood pressure has been ranging around 120's/60.  DTPs: Patient states she may not be able to get Januvia due to the price. Patient states she is no longer taking Uptravi due to side effects she was having:"upset stomach".  [place] Pharmacist Assistant [phone]    Addendum 11/22/20: Damaris Schooner with patient regarding Januvia. Given cost concerns and duplicative action with Trulicity, recommend stopping Januvia at this time.   Junius Argyle, PharmD, [place] [phone]

## 2020-11-22 NOTE — Addendum Note (Signed)
Addended by: Daron Offer A on: 11/22/2020 03:05 PM   Modules accepted: Orders

## 2020-11-23 ENCOUNTER — Ambulatory Visit
Admission: EM | Admit: 2020-11-23 | Discharge: 2020-11-23 | Disposition: A | Discharge status: Home / Self Care | Payer: Medicare Other | Attending: Family Medicine | Admitting: Family Medicine

## 2020-11-23 ENCOUNTER — Other Ambulatory Visit: Payer: Self-pay

## 2020-11-23 DIAGNOSIS — M5432 Sciatica, left side: Secondary | ICD-10-CM | POA: Diagnosis not present

## 2020-11-23 MED ORDER — HYDROCODONE-ACETAMINOPHEN 5-325 MG PO TABS: 1.0000 | ORAL_TABLET | Freq: Four times a day (QID) | ORAL | 0 refills | Status: DC | PRN

## 2020-11-23 MED ORDER — METHYLPREDNISOLONE SODIUM SUCC 125 MG IJ SOLR
60.0000 mg | Freq: Once | INTRAMUSCULAR | Status: AC
  Administered 2020-11-23: 60 mg via INTRAMUSCULAR

## 2020-11-23 NOTE — Discharge Instructions (Addendum)
I believe this is sciatic nerve pain. We gave you a steroid injection here.  I am sending some pain medication to the pharmacy to take as needed for pain.  This medication includes Tylenol be sure not to be using too much Tylenol at home in addition to this medication. For any worsening problems you will need to go to the ER.

## 2020-11-23 NOTE — Telephone Encounter (Signed)
Patient was called and appointment was changed to 11/27/2020 @ 2:20 PM.

## 2020-11-23 NOTE — ED Triage Notes (Signed)
Pt presents with L sided hip pain x 1+ weeks. No known injury.  Pain travels down thigh to medial L knee.  Worse with certain positions including laying or sitting.  Walking is the only thing that does not exacerbate it.  Tylenol, several ointments, heat and ice have not helped.    Pt states she started [place] for pulmonary HTN and hip pain started on her first step of titrating up.  Was told it may have played a role in her hip pain.  She has stopped medicine but no reduction in pain.

## 2020-11-23 NOTE — ED Provider Notes (Signed)
[name]    CSN: [id number] Arrival date & time: 11/23/20  0831      History   Chief Complaint Chief Complaint  Patient presents with  . Hip Pain    L    HPI Ann Flynn is a 80 y.o. female.   Patient is a 80 year old female that presents today with left sided hip/buttocks pain.  This is been present for the past week or so.  The pain is worsened.  No injuries or falls prior to this starting.  The pain travels down the medial thigh to left knee.  Describes the pain as sharp and stabbing at times.  Worse with laying down or sitting.  She tried Tylenol, several ointments, heat and ice which have not helped.  Recently started new medication for pulmonary hypertension but was told to discontinue this due to possible cause of pain.  This is not helped at all.  Denies any numbness, tingling or weakness in the leg.  Denies any rashes, fevers, chills.   Hip Pain    Past Medical History:  Diagnosis Date  . A-fib ([place])   . Anemia   . CHF (congestive heart failure) ([place])   . Chronic kidney disease   . Diabetes mellitus without complication ([place])   . Hypertension   . Pulmonary hypertension ([place])   . Syncope 07/2020    Patient Active Problem List   Diagnosis Date Noted  . Syncope and collapse 08/05/2020  . Atrial fibrillation, chronic ([place]) 03/24/2020  . Type 2 diabetes mellitus with stage 4 chronic kidney disease ([place]) 03/24/2020  . Syncope 03/24/2020  . Pulmonary hypertension ([place]) 03/24/2020  . Subdural hematoma, acute ([place]) 03/24/2020  . Pulmonary nodules 01/30/2020  . Chronic pulmonary embolism, unspecified pulmonary embolism type, unspecified whether acute cor pulmonale present ([place]) 10/06/2019  . Chronic respiratory failure with hypoxia ([place]) 10/06/2019  . Morbid (severe) obesity due to excess calories ([place]) 10/06/2019  . Hypertension associated with diabetes ([place]) 10/06/2019  . Hyperlipidemia associated with type 2 diabetes mellitus ([place]) 10/06/2019  .  CKD (chronic kidney disease), stage IV ([place]) 04/01/2019  . Allergic rhinitis 03/28/2019  . Arthritis 03/28/2019  . CHF (congestive heart failure) ([place]) 03/28/2019  . Diabetes mellitus without complication ([place]) 33/38/3291  . Sleep apnea 03/28/2019  . Pulmonary hypertension, primary ([place]) 03/28/2019    Past Surgical History:  Procedure Laterality Date  . ABDOMINAL HYSTERECTOMY  1997  . ABDOMINAL HYSTERECTOMY    . CARDIOVERSION N/A 07/10/2020   Procedure: CARDIOVERSION;  Surgeon: Jolaine Artist, MD;  Location: [facility] ENDOSCOPY;  Service: Cardiovascular;  Laterality: N/A;  . cartiod artery surgery  2010   per patient   . CHOLECYSTECTOMY  10/19/2009  . masectomy    . MASTECTOMY  1994  . RIGHT HEART CATH N/A 07/29/2019   Procedure: RIGHT HEART CATH;  Surgeon: Jolaine Artist, MD;  Location: [place] CV LAB;  Service: Cardiovascular;  Laterality: N/A;  . RIGHT HEART CATH N/A 05/11/2020   Procedure: RIGHT HEART CATH;  Surgeon: Jolaine Artist, MD;  Location: [place] CV LAB;  Service: Cardiovascular;  Laterality: N/A;  . RIGHT HEART CATH N/A 08/07/2020   Procedure: RIGHT HEART CATH;  Surgeon: Jolaine Artist, MD;  Location: [place] CV LAB;  Service: Cardiovascular;  Laterality: N/A;    OB History   No obstetric history on file.      Home Medications    Prior to Admission medications   Medication Sig Start Date End Date Taking? Authorizing  Provider  HYDROcodone-acetaminophen (NORCO/VICODIN) 5-325 MG tablet Take 1-2 tablets by mouth every 6 (six) hours as needed. 11/23/20  Yes Roper Tolson A, NP  losartan (COZAAR) 50 MG tablet Take 50 mg by mouth daily.   Yes [provider]  acetaminophen (TYLENOL) 650 MG CR tablet Take 650-1,300 mg by mouth every 8 (eight) hours as needed for pain.    [provider]  atorvastatin (LIPITOR) 80 MG tablet Take 1 tablet (80 mg total) by mouth daily. 09/24/20   Trinna Post, PA-C  BD PEN NEEDLE NANO U/F 32G X 4  MM MISC USE TO INJECT ONCE DAILY AS DIRECTED 11/30/19   Trinna Post, PA-C  Cholecalciferol (VITAMIN D3) 50 MCG (2000 UT) TABS Take 2,000 Units by mouth in the morning and at bedtime.    [provider]  docusate sodium (COLACE) 100 MG capsule Take 100 mg by mouth daily as needed (constipation.).     [provider]  Dulaglutide (TRULICITY) 5.09 TO/6.7TI SOPN Inject 0.75 mg into the skin once a week. 10/24/20   Trinna Post, PA-C  empagliflozin (JARDIANCE) 10 MG TABS tablet Take 1 tablet (10 mg total) by mouth daily before breakfast. 08/29/20   Trinna Post, PA-C  insulin glargine, 1 Unit Dial, (TOUJEO SOLOSTAR) 300 UNIT/ML Solostar Pen INJECT 50 UNITS SUBCUTANEOUSLY ONCE DAILY 10/23/20   Trinna Post, PA-C  metoprolol succinate (TOPROL XL) 25 MG 24 hr tablet Take 1 tablet (25 mg total) by mouth every evening. Patient not taking: Reported on 10/23/2020 09/25/20   Bensimhon, Shaune Pascal, MD  omeprazole (PRILOSEC) 40 MG capsule Take 1 capsule (40 mg total) by mouth daily. 08/29/20   Trinna Post, PA-C  ONETOUCH ULTRA test strip USE AS DIRECTED 05/04/20   Trinna Post, PA-C  tadalafil, PAH, (ADCIRCA) 20 MG tablet Take 2 tablets (40 mg total) by mouth at bedtime. 04/17/20   Flora Lipps, MD  torsemide (DEMADEX) 20 MG tablet Take 1 tablet (20 mg total) by mouth daily. 05/03/20   Bensimhon, Shaune Pascal, MD  venlafaxine (EFFEXOR) 75 MG tablet Take 1 tablet (75 mg total) by mouth daily. 11/01/20   Trinna Post, PA-C  vitamin B-12 (CYANOCOBALAMIN) 1000 MCG tablet Take 1,000 mcg by mouth in the morning and at bedtime.    [provider]  warfarin (COUMADIN) 4 MG tablet TAKE ONE TABLET BY MOUTH ON SUNDAY, MONDAY, ONCE WEEKLY ON TUESDAY, [place], Friday. 11/14/20   Virginia Crews, MD    Family History Family History  Problem Relation Age of Onset  . CAD Mother   . Heart disease Father   . CAD Maternal Grandmother     Social History Social History    Tobacco Use  . Smoking status: Never Smoker  . Smokeless tobacco: Never Used  Vaping Use  . Vaping Use: Never used  Substance Use Topics  . Alcohol use: Never  . Drug use: Never     Allergies   Penicillins and Sulfa antibiotics   Review of Systems Review of Systems   Physical Exam Triage Vital Signs ED Triage Vitals  Enc Vitals Group     BP 11/23/20 0846 (!) 148/73     Pulse Rate 11/23/20 0846 100     Resp 11/23/20 0846 (!) 21     Temp 11/23/20 0846 98.3 F (36.8 C)     Temp Source 11/23/20 0846 Oral     SpO2 11/23/20 0846 97 %     Weight --  Height --      Head Circumference --      Peak Flow --      Pain Score 11/23/20 0844 8     Pain Loc --      Pain Edu? --      Excl. in [place]? --    No data found.  Updated Vital Signs BP (!) 148/73 (BP Location: Left Arm)   Pulse 100   Temp 98.3 F (36.8 C) (Oral)   Resp (!) 21   SpO2 97%   Visual Acuity Right Eye Distance:   Left Eye Distance:   Bilateral Distance:    Right Eye Near:   Left Eye Near:    Bilateral Near:     Physical Exam Vitals and nursing note reviewed.  Constitutional:      General: She is not in acute distress.    Appearance: Normal appearance. She is not ill-appearing, toxic-appearing or diaphoretic.     Comments: Appears in pain On home oxygen   HENT:     Head: Normocephalic.  Eyes:     Conjunctiva/sclera: Conjunctivae normal.  Pulmonary:     Effort: Pulmonary effort is normal.  Musculoskeletal:        General: Normal range of motion.     Cervical back: Normal range of motion.       Legs:     Comments: Pain with palpation to sciatic notch.  No pain to hip with palpation.  No rash.  Positive left straight leg raise.   Skin:    General: Skin is warm and dry.     Findings: No rash.  Neurological:     Mental Status: She is alert.  Psychiatric:        Mood and Affect: Mood normal.      UC Treatments / Results  Labs (all labs ordered are listed, but only abnormal  results are displayed) Labs Reviewed - No data to display  EKG   Radiology No results found.  Procedures Procedures (including critical care time)  Medications Ordered in UC Medications  methylPREDNISolone sodium succinate (SOLU-MEDROL) 125 mg/2 mL injection 60 mg (60 mg Intramuscular Given 11/23/20 0918)    Initial Impression / Assessment and Plan / UC Course  I have reviewed the triage vital signs and the nursing notes.  Pertinent labs & imaging results that were available during my care of the patient were reviewed by me and considered in my medical decision making (see chart for details).     Left-sided nerve pain.  Most likely the cause of pain.  She has no pain to hip joint or bony hip area. No rash to be concerned about shingles. Will treat with low-dose steroid injection here today.  Sending hydrocodone to the pharmacy to take as needed for severe pain Recommended ER for worsening symptoms. Final Clinical Impressions(s) / UC Diagnoses   Final diagnoses:  Sciatic nerve pain, left     Discharge Instructions     I believe this is sciatic nerve pain. We gave you a steroid injection here.  I am sending some pain medication to the pharmacy to take as needed for pain.  This medication includes Tylenol be sure not to be using too much Tylenol at home in addition to this medication. For any worsening problems you will need to go to the ER.    ED Prescriptions    Medication Sig Dispense Auth. Provider   HYDROcodone-acetaminophen (NORCO/VICODIN) 5-325 MG tablet Take 1-2 tablets by mouth every 6 (six) hours  as needed. 20 tablet Brice Potteiger A, NP     I have reviewed the PDMP during this encounter.   Loura Halt A, NP 11/23/20 [phone]

## 2020-11-26 ENCOUNTER — Telehealth (HOSPITAL_COMMUNITY): Payer: Self-pay | Admitting: Pharmacist

## 2020-11-26 NOTE — Telephone Encounter (Signed)
Received message from [place]. Patient has been started on Uptravi. She complained of new hip pain so Uptravi was decreased to 400 mcg BID.   Audry Riles, PharmD, BCPS, BCCP, CPP Heart Failure Clinic Pharmacist [phone]

## 2020-11-27 ENCOUNTER — Other Ambulatory Visit: Payer: Self-pay

## 2020-11-27 ENCOUNTER — Ambulatory Visit
Admission: RE | Admit: 2020-11-27 | Discharge: 2020-11-27 | Disposition: A | Discharge status: Home / Self Care | Payer: Medicare Other | Attending: Family Medicine | Admitting: Family Medicine

## 2020-11-27 ENCOUNTER — Ambulatory Visit (INDEPENDENT_AMBULATORY_CARE_PROVIDER_SITE_OTHER): Payer: Medicare Other | Admitting: Family Medicine

## 2020-11-27 ENCOUNTER — Ambulatory Visit
Admission: RE | Admit: 2020-11-27 | Discharge: 2020-11-27 | Disposition: A | Discharge status: Home / Self Care | Payer: Medicare Other | Source: Ambulatory Visit | Attending: Family Medicine | Admitting: Family Medicine

## 2020-11-27 VITALS — BP 112/68 | HR 61 | Ht 65.0 in | Wt 238.0 lb

## 2020-11-27 DIAGNOSIS — M5432 Sciatica, left side: Secondary | ICD-10-CM | POA: Insufficient documentation

## 2020-11-27 DIAGNOSIS — M5030 Other cervical disc degeneration, unspecified cervical region: Secondary | ICD-10-CM | POA: Diagnosis not present

## 2020-11-27 MED ORDER — HYDROCODONE-ACETAMINOPHEN 7.5-325 MG PO TABS: 1.0000 | ORAL_TABLET | Freq: Four times a day (QID) | ORAL | 0 refills | Status: DC | PRN

## 2020-11-27 MED ORDER — PREDNISONE 20 MG PO TABS: ORAL_TABLET | ORAL | 0 refills | Status: AC

## 2020-11-27 NOTE — Progress Notes (Signed)
Established patient visit   Patient: Ann Flynn   DOB: [birth year]   80 y.o. Female  MRN: [id number] Visit Date: 11/27/2020  Today's healthcare provider: Lelon Huh, MD   No chief complaint on file.  Subjective    Back Pain This is a new problem. Episode onset: about 2 weeks. The problem occurs constantly. The problem is unchanged. The pain is present in the lumbar spine. The quality of the pain is described as stabbing. The pain is the same all the time. Exacerbated by: Walking. Associated symptoms include abdominal pain, headaches, leg pain and weight loss. Pertinent negatives include no bladder incontinence, bowel incontinence, chest pain, dysuria, fever, numbness, tingling or weakness. She has tried NSAIDs (Topical ointment) for the symptoms. The treatment provided moderate relief.   She states she was diagnosed with sciatica last week at [facility] and was given steroid shot and prescription for hydrocodone/apap. The steroid shot helped for a couple of hours and was only given 4 hydrocodone/apap which she doesn't think was enough to make a difference. Pain has since gotten progressively worse. Radiates to left buttocks and back of leg.       Medications: Outpatient Medications Prior to Visit  Medication Sig  . acetaminophen (TYLENOL) 650 MG CR tablet Take 650-1,300 mg by mouth every 8 (eight) hours as needed for pain.  [name] atorvastatin (LIPITOR) 80 MG tablet Take 1 tablet (80 mg total) by mouth daily.  . BD PEN NEEDLE NANO U/F 32G X 4 MM MISC USE TO INJECT ONCE DAILY AS DIRECTED  . Cholecalciferol (VITAMIN D3) 50 MCG (2000 UT) TABS Take 2,000 Units by mouth in the morning and at bedtime.  . docusate sodium (COLACE) 100 MG capsule Take 100 mg by mouth daily as needed (constipation.).   [name] Dulaglutide (TRULICITY) 4.17 EY/8.1KG SOPN Inject 0.75 mg into the skin once a week.  . empagliflozin (JARDIANCE) 10 MG TABS tablet Take 1 tablet (10 mg total) by mouth daily  before breakfast.  . HYDROcodone-acetaminophen (NORCO/VICODIN) 5-325 MG tablet Take 1-2 tablets by mouth every 6 (six) hours as needed.  . insulin glargine, 1 Unit Dial, (TOUJEO SOLOSTAR) 300 UNIT/ML Solostar Pen INJECT 50 UNITS SUBCUTANEOUSLY ONCE DAILY  . losartan (COZAAR) 50 MG tablet Take 50 mg by mouth daily.  . metoprolol succinate (TOPROL XL) 25 MG 24 hr tablet Take 1 tablet (25 mg total) by mouth every evening. (Patient not taking: Reported on 10/23/2020)  . omeprazole (PRILOSEC) 40 MG capsule Take 1 capsule (40 mg total) by mouth daily.  [name] ULTRA test strip USE AS DIRECTED  . Selexipag (UPTRAVI) 200 & 800 MCG TBPK Take 200 mcg BID. Increase by 200 mcg BID every 1-2 weeks as tolerated to max dose of 1600 mcg BID.  [name] tadalafil, PAH, (ADCIRCA) 20 MG tablet Take 2 tablets (40 mg total) by mouth at bedtime.  . torsemide (DEMADEX) 20 MG tablet Take 1 tablet (20 mg total) by mouth daily.  [name] venlafaxine (EFFEXOR) 75 MG tablet Take 1 tablet (75 mg total) by mouth daily.  . vitamin B-12 (CYANOCOBALAMIN) 1000 MCG tablet Take 1,000 mcg by mouth in the morning and at bedtime.  [name] warfarin (COUMADIN) 4 MG tablet TAKE ONE TABLET BY MOUTH ON SUNDAY, MONDAY, ONCE WEEKLY ON TUESDAY, THURSDAY, Friday.   No facility-administered medications prior to visit.    Review of Systems  Constitutional: Positive for weight loss. Negative for fever.  Cardiovascular: Negative for chest pain.  Gastrointestinal: Positive for abdominal  pain. Negative for bowel incontinence.  Genitourinary: Negative for bladder incontinence and dysuria.  Musculoskeletal: Positive for back pain.  Neurological: Positive for headaches. Negative for tingling, weakness and numbness.        Objective    BP 112/68 (BP Location: Right Arm, Patient Position: Sitting, Cuff Size: Large)   Pulse 61   Ht _0  (1.651 m)   Wt 238 lb (108 kg)   SpO2 98%   BMI 39.61 kg/m    Physical Exam   Tender over lower lumbar spine. MS +4  bilaterally, requires mobility assistance device. No change in LE strength from baseline.     Assessment & Plan     1. DDD (degenerative disc disease), cervical (per previous MRI_  2. Sciatica, left side  - predniSONE (DELTASONE) 20 MG tablet; Take one tablet twice a day for 7 days  Dispense: 14 tablet; Refill: 0 - HYDROcodone-acetaminophen (NORCO) 7.5-325 MG tablet; Take 1 tablet by mouth every 6 (six) hours as needed for up to 5 days for moderate pain.  Dispense: 20 tablet; Refill: 0 - DG Lumbar Spine Complete; Future        The entirety of the information documented in the History of Present Illness, Review of Systems and Physical Exam were personally obtained by me. Portions of this information were initially documented by the CMA and reviewed by me for thoroughness and accuracy.      Lelon Huh, MD  [facility] [phone] (phone) [phone] (fax)  [place]

## 2020-11-27 NOTE — Patient Instructions (Addendum)
.   Go to the [facility] on [facility] for low back [place]  .

## 2020-11-30 ENCOUNTER — Ambulatory Visit: Payer: Medicare Other | Admitting: Family Medicine

## 2020-12-05 ENCOUNTER — Ambulatory Visit (HOSPITAL_BASED_OUTPATIENT_CLINIC_OR_DEPARTMENT_OTHER)
Admission: RE | Admit: 2020-12-05 | Discharge: 2020-12-05 | Disposition: A | Discharge status: Home / Self Care | Payer: Medicare Other | Source: Ambulatory Visit | Attending: Internal Medicine | Admitting: Internal Medicine

## 2020-12-05 ENCOUNTER — Other Ambulatory Visit: Payer: Self-pay

## 2020-12-05 ENCOUNTER — Encounter (HOSPITAL_COMMUNITY): Payer: Self-pay | Admitting: Internal Medicine

## 2020-12-05 VITALS — BP 142/68 | HR 58 | Wt 235.0 lb

## 2020-12-05 DIAGNOSIS — I2721 Secondary pulmonary arterial hypertension: Secondary | ICD-10-CM

## 2020-12-05 DIAGNOSIS — I080 Rheumatic disorders of both mitral and aortic valves: Secondary | ICD-10-CM | POA: Insufficient documentation

## 2020-12-05 DIAGNOSIS — I480 Paroxysmal atrial fibrillation: Secondary | ICD-10-CM | POA: Insufficient documentation

## 2020-12-05 DIAGNOSIS — E1151 Type 2 diabetes mellitus with diabetic peripheral angiopathy without gangrene: Secondary | ICD-10-CM | POA: Insufficient documentation

## 2020-12-05 DIAGNOSIS — Z8673 Personal history of transient ischemic attack (TIA), and cerebral infarction without residual deficits: Secondary | ICD-10-CM | POA: Insufficient documentation

## 2020-12-05 DIAGNOSIS — Z79899 Other long term (current) drug therapy: Secondary | ICD-10-CM | POA: Insufficient documentation

## 2020-12-05 DIAGNOSIS — R55 Syncope and collapse: Secondary | ICD-10-CM | POA: Insufficient documentation

## 2020-12-05 DIAGNOSIS — Z880 Allergy status to penicillin: Secondary | ICD-10-CM | POA: Insufficient documentation

## 2020-12-05 DIAGNOSIS — N184 Chronic kidney disease, stage 4 (severe): Secondary | ICD-10-CM | POA: Insufficient documentation

## 2020-12-05 DIAGNOSIS — I5032 Chronic diastolic (congestive) heart failure: Secondary | ICD-10-CM

## 2020-12-05 DIAGNOSIS — Z7984 Long term (current) use of oral hypoglycemic drugs: Secondary | ICD-10-CM | POA: Insufficient documentation

## 2020-12-05 DIAGNOSIS — R0602 Shortness of breath: Secondary | ICD-10-CM | POA: Diagnosis not present

## 2020-12-05 DIAGNOSIS — Z7901 Long term (current) use of anticoagulants: Secondary | ICD-10-CM | POA: Insufficient documentation

## 2020-12-05 DIAGNOSIS — I5042 Chronic combined systolic (congestive) and diastolic (congestive) heart failure: Secondary | ICD-10-CM | POA: Insufficient documentation

## 2020-12-05 DIAGNOSIS — Z794 Long term (current) use of insulin: Secondary | ICD-10-CM | POA: Insufficient documentation

## 2020-12-05 DIAGNOSIS — Z86711 Personal history of pulmonary embolism: Secondary | ICD-10-CM | POA: Diagnosis not present

## 2020-12-05 DIAGNOSIS — E1122 Type 2 diabetes mellitus with diabetic chronic kidney disease: Secondary | ICD-10-CM | POA: Insufficient documentation

## 2020-12-05 DIAGNOSIS — Z882 Allergy status to sulfonamides status: Secondary | ICD-10-CM | POA: Insufficient documentation

## 2020-12-05 DIAGNOSIS — I482 Chronic atrial fibrillation, unspecified: Secondary | ICD-10-CM | POA: Diagnosis not present

## 2020-12-05 DIAGNOSIS — G4733 Obstructive sleep apnea (adult) (pediatric): Secondary | ICD-10-CM | POA: Insufficient documentation

## 2020-12-05 DIAGNOSIS — I2510 Atherosclerotic heart disease of native coronary artery without angina pectoris: Secondary | ICD-10-CM

## 2020-12-05 DIAGNOSIS — I5022 Chronic systolic (congestive) heart failure: Secondary | ICD-10-CM | POA: Diagnosis not present

## 2020-12-05 DIAGNOSIS — I2729 Other secondary pulmonary hypertension: Secondary | ICD-10-CM | POA: Insufficient documentation

## 2020-12-05 DIAGNOSIS — Z8249 Family history of ischemic heart disease and other diseases of the circulatory system: Secondary | ICD-10-CM | POA: Insufficient documentation

## 2020-12-05 LAB — ECHOCARDIOGRAM COMPLETE
Area-P 1/2: 2.58 cm2
Calc EF: 53.8 %
MV VTI: 1.3 cm2
S' Lateral: 3.3 cm
Single Plane A2C EF: 53.8 %
Single Plane A4C EF: 55.7 %

## 2020-12-05 NOTE — Progress Notes (Signed)
  Echocardiogram 2D Echocardiogram has been performed.  Ann Flynn 12/05/2020, 12:08 PM

## 2020-12-05 NOTE — Patient Instructions (Signed)
Please call our office in September to schedule your follow up appointment  If you have any questions or concerns before your next appointment please send [place] a message through [place] or call our office at [phone].    TO LEAVE A MESSAGE FOR THE NURSE SELECT OPTION 2, PLEASE LEAVE A MESSAGE INCLUDING: . YOUR NAME . DATE OF BIRTH . CALL BACK NUMBER . REASON FOR CALL**this is important as we prioritize the call backs  [place] AS LONG AS YOU CALL BEFORE 4:00 PM  At the [facility], you and your health needs are our priority. As part of our continuing mission to provide you with exceptional heart care, we have created designated Provider Care Teams. These Care Teams include your primary Cardiologist (physician) and Advanced Practice Providers (APPs- Physician Assistants and Nurse Practitioners) who all work together to provide you with the care you need, when you need it.   You may see any of the following providers on your designated Care Team at your next follow up: [name] Dr Glori Bickers . Dr Loralie Champagne . Dr Vickki Muff . Darrick Grinder, NP . Lyda Jester, [place] . Audry Riles, PharmD   Please be sure to bring in all your medications bottles to every appointment.

## 2020-12-05 NOTE — Progress Notes (Signed)
Advanced Heart Failure Clinic Note  PCP:  Birdie Sons, MD  Cardiologist:  Dr. Rockey Situ HF Cardiologist: Dr. [name]  Chief Complaint: post-hospital follow up   History of Present Illness:  Ms.Glover is a 80 year old woman with h/o morbid obesity, DM, OSA on CPAP, CKD 4, PAD, CVA, carotid surgery in 2010, pulmonary embolism (2009) and PAH referred by [facility] for further evaluation of her PAH.   She moved from [place], [place] in 2021 to be near her family as things were getting harder. Lives by herself in a townhome.  Reports DVT/PE in 2009 and has been on coumadin since. Was diagnosed with PAH in 2016 at [facility] in [place]. Right and left heart cath at that time reported as nonobstructive CAD and severe PAH with PAP 103/19 (51) with PCWP 5. (see results below) Was told she had idiopathic PAH and started on Letairis and cialis. She weighed 255 pounds at the time.   Never smoked but was exposed to 2ndhand smoke. Previouslyon portable oxygen at 5L when [place] was in [place], but the portable O2 was not covered. Now "goes without oxygen", only at nightthat she wear oxygen with CPAP. Denies h/o CTD.  I saw her for the first time in 11/20: Felt to have primarily WHO Group III PH. Echo and VQ ordered.   Had a syncopal episode in 8/21. CT head showed small 2 mm subdural hematoma. Echocardiogram was obtained, echocardiogram showed EF 55 to 60%, moderate left ventricular hypertrophy, moderately elevated pulmonary artery systolic pressure.Outpatient monitor was ordered and showed atrial flutter continuously (100% burden) with rates ranging from 50 to 133 (average 85 bpm). F/u CT scan 8/23 SDH resolved  OV 11/21 She felt more dizzy since decreasing torsemide, had a fall over Halloween. [name] was started. Had successful DCCV on 07/10/20.  Admitted to [facility] 12/21  after a syncopal episode associated with dizziness prior to passing out. HR was in the 40-50s when EMS arrived. She  admits to running out of her [name] recently. Echo 12/21 with EF 60-65%, RV normal, RHC with moderate to severe PAH with high PA pulse pressure and high cardiac output & moderately elevated left-sided pressures. Cardiac MRI showed EF 51%, mild RV dilation, mod pulmonary regurgitation, markedly dilated main pulmonay artery, and RV insertion site LGE. Patient discharged home with Zio patch. Discharge weight 242 lbs.  At last visit we started Uptravi after [place] in 12/21 showed worsening PAH. Did not tolerate due to HAs, nausea and hip pain. However hip pain has persisted after stopping and found to have degenerative disc disease. Says she is doing ok. Can do ADLs slowly. No change in breathing. No orthopnea, edema or PND. Using CPAP every night.  Now taking Letairis 16m and tadalafil 40 daily    08/06/20 Echo  EF 60-65% RV normal   cMRI 08/07/20: 1. Motion artifact throughout exam affects quantification of volumes 2. No evidence of shunt with Qp/Qs = 0.94 3. Normal LV size and systolic function (EF 529% 4. Mild RV dilatation with normal systolic function (EF 447% 5. Moderate pulmonary regurgitation (regurgitant fraction 23%) 6. RV insertion site late gadolinium enhancement, which is a nonspecific finding often seen in setting of pulmonary hypertension 7. Markedly dilated main pulmonary artery, measuring 523mx 4429m. Mild mitral regurgitation (regurgitant fraction 18%), mild aortic regurgitation (regurgitant fraction 15%), and mild tricuspid regurgitation (regurgitant fraction 12%)  VQ 07/07/19: No PE  RHC  07/29/19 RA = 8 RV = 81/14 PA =  80/18 (42) PCW =17 Fick cardiac output/index = 9.25/4.25 PVR = 2.70 WU FA sat = 98% PA sat = 75%, 75% High SVC 72%  RHC 9/21 RA = 8 RV = 65/7 PA = 61/19 (34) PCW = 19 (v= 27) Fick cardiac output/index = 8.1/3.8 PVR = 1.2 WU Ao sat = 99% PA sat = 75%, 78% SVC = 70%  RHC 12/21 RA = 11 RV = 91/14 PA = 93/20 (47) PCW = 21 Fick  cardiac output/index = 8.0/3.7 PVR = 3.1 WU FA sat = 99% PA sat = 72%, 73% High SVC = 72% PAPi = 6.6   PFTs 09/28/19  FEV1 1.92 (57%) FVC 2.57 (58%) DLCO 61%  Previous cardiac studies:  12/16/2014 right and left heart catheterization with nitric oxide inhalation, Pressure data resting: -Mean right atrial pressure 2 Right ventricule 267/1 and diastolic -Pulmonary artery 103/19, mean 51. Pulmonary artery wedge mean 5 -Ascending thoracic aorta 111/53, mean 75. -Fick cardiac output 4.2 L. -Pulmonary resistance 12.25 Woods units. With nitric oxide inhalation -Pulmonary artery 89/29, mean 42. -Right ventricle 24/5 and diastolic. -Right atrial 2. -Fick cardiac output 4.94 L. -Pulmonary resistance 15.91 Woods units.  Carotid US 1/22:    Past Medical History:  Diagnosis Date  . A-fib ([place])   . Anemia   . CHF (congestive heart failure) ([place])   . Chronic kidney disease   . Diabetes mellitus without complication ([place])   . Hypertension   . Pulmonary hypertension ([place])   . Syncope 07/2020   Past Surgical History:  Procedure Laterality Date  . ABDOMINAL HYSTERECTOMY  1997  . ABDOMINAL HYSTERECTOMY    . CARDIOVERSION N/A 07/10/2020   Procedure: CARDIOVERSION;  Surgeon: Jolaine Artist, MD;  Location: [facility] ENDOSCOPY;  Service: Cardiovascular;  Laterality: N/A;  . cartiod artery surgery  2010   per patient   . CHOLECYSTECTOMY  10/19/2009  . masectomy    . MASTECTOMY  1994  . RIGHT HEART CATH N/A 07/29/2019   Procedure: RIGHT HEART CATH;  Surgeon: Jolaine Artist, MD;  Location: [place] CV LAB;  Service: Cardiovascular;  Laterality: N/A;  . RIGHT HEART CATH N/A 05/11/2020   Procedure: RIGHT HEART CATH;  Surgeon: Jolaine Artist, MD;  Location: [facility] CV LAB;  Service: Cardiovascular;  Laterality: N/A;  . RIGHT HEART CATH N/A 08/07/2020   Procedure: RIGHT HEART CATH;  Surgeon: Jolaine Artist, MD;  Location: [facility] CV LAB;  Service:  Cardiovascular;  Laterality: N/A;    Current Outpatient Medications  Medication Sig Dispense Refill  . acetaminophen (TYLENOL) 650 MG CR tablet Take 650-1,300 mg by mouth every 8 (eight) hours as needed for pain.    [name] ambrisentan (LETAIRIS) 5 MG tablet Take 5 mg by mouth daily.    [name] atorvastatin (LIPITOR) 80 MG tablet Take 1 tablet (80 mg total) by mouth daily. 90 tablet 2  . BD PEN NEEDLE NANO U/F 32G X 4 MM MISC USE TO INJECT ONCE DAILY AS DIRECTED 100 each 2  . Cholecalciferol (VITAMIN D3) 50 MCG (2000 UT) TABS Take 2,000 Units by mouth in the morning and at bedtime.    . docusate sodium (COLACE) 100 MG capsule Take 100 mg by mouth daily as needed (constipation.).     [name] Dulaglutide (TRULICITY) 8.09 XI/3.3AS SOPN Inject 0.75 mg into the skin once a week. 2 mL 1  . empagliflozin (JARDIANCE) 10 MG TABS tablet Take 1 tablet (10 mg total) by mouth daily before breakfast. 30 tablet 6  .  insulin glargine, 1 Unit Dial, (TOUJEO SOLOSTAR) 300 UNIT/ML Solostar Pen INJECT 50 UNITS SUBCUTANEOUSLY ONCE DAILY 4.5 mL 1  . losartan (COZAAR) 50 MG tablet Take 50 mg by mouth daily.    . Metoprolol Succinate 25 MG CS24 Take 25 tablets by mouth daily in the afternoon.    [name] omeprazole (PRILOSEC) 40 MG capsule Take 1 capsule (40 mg total) by mouth daily. 90 capsule 1  . ONETOUCH ULTRA test strip USE AS DIRECTED 100 each 1  . tadalafil, PAH, (ADCIRCA) 20 MG tablet Take 2 tablets (40 mg total) by mouth at bedtime. 180 tablet 3  . torsemide (DEMADEX) 20 MG tablet Take 1 tablet (20 mg total) by mouth daily. 90 tablet 2  . venlafaxine (EFFEXOR) 75 MG tablet Take 1 tablet (75 mg total) by mouth daily. 90 tablet 0  . vitamin B-12 (CYANOCOBALAMIN) 1000 MCG tablet Take 1,000 mcg by mouth in the morning and at bedtime.    [name] warfarin (COUMADIN) 4 MG tablet TAKE ONE TABLET BY MOUTH ON SUNDAY, MONDAY, ONCE WEEKLY ON TUESDAY, THURSDAY, Friday. 30 tablet 1   No current facility-administered medications for this encounter.     Allergies:   Penicillins and Sulfa antibiotics   Social History:  The patient  reports that she has never smoked. She has never used smokeless tobacco. She reports that she does not drink alcohol and does not use drugs.   Family History:  The patient's family history includes CAD in her maternal grandmother and mother; Heart disease in her father.   ROS:  Please see the history of present illness.   All other systems are personally reviewed and negative.   Vitals:   12/05/20 1222  BP: (!) 142/68  Pulse: (!) 58  SpO2: 100%  Weight: 106.6 kg    Wt Readings from Last 3 Encounters:  12/05/20 106.6 kg  11/27/20 108 kg  10/23/20 109.4 kg    Exam:   General:  Elderly woman, No resp difficulty, NAD  On O2 by [place] HEENT: normal Neck: supple. no JVD. Carotids 2+ bilat; no bruits. No lymphadenopathy or thryomegaly appreciated. Cor: PMI nondisplaced. Regular rate & rhythm. No rubs, gallops or murmurs. Lungs: clear Abdomen: obese soft, nontender, nondistended. No hepatosplenomegaly. No bruits or masses. Good bowel sounds. Extremities: no cyanosis, clubbing, rash, edema Neuro: alert & orientedx3, cranial nerves grossly intact. moves all 4 extremities w/o difficulty. Affect pleasant   Recent Labs: 01/27/2020: B Natriuretic Peptide 450.6 07/24/2020: ALT 8 08/09/2020: Hemoglobin 10.5; Platelets 242 08/30/2020: BUN 44; Creatinine, Ser 1.65; Potassium 4.2; Sodium 141  Personally reviewed    ASSESSMENT AND PLAN:  1. Recurrent Syncope - Given severity of PAH, suspicion has been that this was related to [facility] but cardiac outputs and PA pulsativity argue against this somewhat unless preload dropped for some reason. She was bradycardic on arrival to ED which may also be contributing factor.   - No further syncopal episodes or dizziness since discharge. - Zio 12/21. SR with frequent runs of SVT (1547 runs - longest 58 seconds)/ No high-grade arrhythmias or pauses  2. Pulmonary HTN (severe)  with cor pulmonale - Suspect combination of WHO Group 1 & 3 disease - VQ 07/07/19: No PE - RV function remains normal on echo despite severe PAH - PFTs with significant restrictive lung physiology -PA pressures 12/21 very high but PA pulsativity and cardiac output much higher than would be suspected in true [place] with pressures of this magnitude.  - cMRI showed no evidence of shunt,  Qp/Qs=0.94. - Continue O2 supplementation and CPAP. - Continue tadalafil 40 mg and letairis 5 mg (unable to tolerate higher dose of letairis). - Echo today 12/05/20 LVEF 60% RV normal - Unable to tolerate selexipag currently. Can consider rechallenge in the future.   - [place] 12/21 with PA pressures back up into 90s with elevated PVR. Will start selexipag,. - Has completed pulmonary rehab  3. Chronic Diastolic HF - Stable NYHA III volume status looks good - Continue torsemide 20 mg daily.  - Continue losartan 25 mg daily. - Labs today  4. CKD 4 - Baseline Creatinine 1.9 -Continue Jardiance. - Echo today 12/05/20 EF 60-65% RV normal Personally reviewed  5. Nonobstructive CAD - No s/s angina - Continue statin.  - Off ASA with warfarin.  6. H/o PE 2009 - On warfarin. - VQ 11/20 negative. - Does not want to switch to DOAC due to cost.  7. Paroxysmal AFL - Was in AFL in 6/21. ECG from 11/20 was NSR. - s/p DC-CV 07/10/20. - Remains in NSR - On warfarin as above   Signed, Glori Bickers, MD  12/05/2020 12:50 PM  Advanced Heart Failure [place] [address] Heart and [place] [address] [phone] (office) [phone] (fax)

## 2020-12-06 ENCOUNTER — Inpatient Hospital Stay (HOSPITAL_COMMUNITY)
Admission: EM | Admit: 2020-12-06 | Discharge: 2020-12-09 | DRG: 309 | Disposition: A | Discharge status: Home / Self Care | Payer: Medicare Other | Attending: Internal Medicine | Admitting: Internal Medicine

## 2020-12-06 ENCOUNTER — Other Ambulatory Visit: Payer: Self-pay

## 2020-12-06 ENCOUNTER — Telehealth (HOSPITAL_COMMUNITY): Payer: Self-pay | Admitting: Cardiology

## 2020-12-06 ENCOUNTER — Ambulatory Visit (HOSPITAL_COMMUNITY)
Admission: RE | Admit: 2020-12-06 | Discharge: 2020-12-06 | Disposition: A | Discharge status: Home / Self Care | Payer: Medicare Other | Source: Ambulatory Visit | Attending: Internal Medicine | Admitting: Internal Medicine

## 2020-12-06 ENCOUNTER — Emergency Department (HOSPITAL_COMMUNITY): Payer: Medicare Other

## 2020-12-06 VITALS — BP 102/62 | HR 148 | Wt 234.0 lb

## 2020-12-06 DIAGNOSIS — Z20822 Contact with and (suspected) exposure to covid-19: Secondary | ICD-10-CM | POA: Diagnosis present

## 2020-12-06 DIAGNOSIS — E1122 Type 2 diabetes mellitus with diabetic chronic kidney disease: Secondary | ICD-10-CM | POA: Diagnosis present

## 2020-12-06 DIAGNOSIS — E1169 Type 2 diabetes mellitus with other specified complication: Secondary | ICD-10-CM | POA: Diagnosis present

## 2020-12-06 DIAGNOSIS — Z7722 Contact with and (suspected) exposure to environmental tobacco smoke (acute) (chronic): Secondary | ICD-10-CM | POA: Diagnosis present

## 2020-12-06 DIAGNOSIS — I4430 Unspecified atrioventricular block: Secondary | ICD-10-CM | POA: Insufficient documentation

## 2020-12-06 DIAGNOSIS — Z79899 Other long term (current) drug therapy: Secondary | ICD-10-CM

## 2020-12-06 DIAGNOSIS — Z8249 Family history of ischemic heart disease and other diseases of the circulatory system: Secondary | ICD-10-CM | POA: Diagnosis not present

## 2020-12-06 DIAGNOSIS — M7989 Other specified soft tissue disorders: Secondary | ICD-10-CM

## 2020-12-06 DIAGNOSIS — I471 Supraventricular tachycardia: Secondary | ICD-10-CM

## 2020-12-06 DIAGNOSIS — I483 Typical atrial flutter: Secondary | ICD-10-CM | POA: Diagnosis present

## 2020-12-06 DIAGNOSIS — Z7901 Long term (current) use of anticoagulants: Secondary | ICD-10-CM

## 2020-12-06 DIAGNOSIS — R791 Abnormal coagulation profile: Secondary | ICD-10-CM | POA: Diagnosis present

## 2020-12-06 DIAGNOSIS — I5042 Chronic combined systolic (congestive) and diastolic (congestive) heart failure: Secondary | ICD-10-CM | POA: Diagnosis present

## 2020-12-06 DIAGNOSIS — N1832 Chronic kidney disease, stage 3b: Secondary | ICD-10-CM | POA: Diagnosis present

## 2020-12-06 DIAGNOSIS — M5136 Other intervertebral disc degeneration, lumbar region: Secondary | ICD-10-CM | POA: Diagnosis present

## 2020-12-06 DIAGNOSIS — Z882 Allergy status to sulfonamides status: Secondary | ICD-10-CM

## 2020-12-06 DIAGNOSIS — Z86711 Personal history of pulmonary embolism: Secondary | ICD-10-CM | POA: Diagnosis not present

## 2020-12-06 DIAGNOSIS — I2721 Secondary pulmonary arterial hypertension: Secondary | ICD-10-CM | POA: Diagnosis present

## 2020-12-06 DIAGNOSIS — I480 Paroxysmal atrial fibrillation: Secondary | ICD-10-CM | POA: Diagnosis present

## 2020-12-06 DIAGNOSIS — I4892 Unspecified atrial flutter: Secondary | ICD-10-CM | POA: Insufficient documentation

## 2020-12-06 DIAGNOSIS — I130 Hypertensive heart and chronic kidney disease with heart failure and stage 1 through stage 4 chronic kidney disease, or unspecified chronic kidney disease: Secondary | ICD-10-CM | POA: Diagnosis present

## 2020-12-06 DIAGNOSIS — N179 Acute kidney failure, unspecified: Secondary | ICD-10-CM | POA: Diagnosis present

## 2020-12-06 DIAGNOSIS — E785 Hyperlipidemia, unspecified: Secondary | ICD-10-CM | POA: Diagnosis present

## 2020-12-06 DIAGNOSIS — Z9010 Acquired absence of unspecified breast and nipple: Secondary | ICD-10-CM

## 2020-12-06 DIAGNOSIS — Z6839 Body mass index (BMI) 39.0-39.9, adult: Secondary | ICD-10-CM

## 2020-12-06 DIAGNOSIS — G4733 Obstructive sleep apnea (adult) (pediatric): Secondary | ICD-10-CM | POA: Diagnosis present

## 2020-12-06 DIAGNOSIS — Z01810 Encounter for preprocedural cardiovascular examination: Secondary | ICD-10-CM | POA: Insufficient documentation

## 2020-12-06 DIAGNOSIS — I4891 Unspecified atrial fibrillation: Secondary | ICD-10-CM | POA: Diagnosis not present

## 2020-12-06 DIAGNOSIS — R0602 Shortness of breath: Secondary | ICD-10-CM | POA: Diagnosis present

## 2020-12-06 DIAGNOSIS — Z7984 Long term (current) use of oral hypoglycemic drugs: Secondary | ICD-10-CM

## 2020-12-06 DIAGNOSIS — I482 Chronic atrial fibrillation, unspecified: Secondary | ICD-10-CM | POA: Diagnosis present

## 2020-12-06 DIAGNOSIS — Z794 Long term (current) use of insulin: Secondary | ICD-10-CM

## 2020-12-06 DIAGNOSIS — Z880 Allergy status to penicillin: Secondary | ICD-10-CM

## 2020-12-06 DIAGNOSIS — T45515A Adverse effect of anticoagulants, initial encounter: Secondary | ICD-10-CM | POA: Diagnosis present

## 2020-12-06 DIAGNOSIS — N184 Chronic kidney disease, stage 4 (severe): Secondary | ICD-10-CM

## 2020-12-06 DIAGNOSIS — E11649 Type 2 diabetes mellitus with hypoglycemia without coma: Secondary | ICD-10-CM | POA: Diagnosis not present

## 2020-12-06 DIAGNOSIS — E860 Dehydration: Secondary | ICD-10-CM | POA: Diagnosis present

## 2020-12-06 DIAGNOSIS — I272 Pulmonary hypertension, unspecified: Secondary | ICD-10-CM | POA: Diagnosis not present

## 2020-12-06 DIAGNOSIS — I5032 Chronic diastolic (congestive) heart failure: Secondary | ICD-10-CM | POA: Diagnosis not present

## 2020-12-06 LAB — COMPREHENSIVE METABOLIC PANEL
ALT: 41 U/L (ref 0–44)
AST: 26 U/L (ref 15–41)
Albumin: 3.3 g/dL — ABNORMAL LOW (ref 3.5–5.0)
Alkaline Phosphatase: 54 U/L (ref 38–126)
Anion gap: 8 (ref 5–15)
BUN: 65 mg/dL — ABNORMAL HIGH (ref 8–23)
CO2: 30 mmol/L (ref 22–32)
Calcium: 8.5 mg/dL — ABNORMAL LOW (ref 8.9–10.3)
Chloride: 102 mmol/L (ref 98–111)
Creatinine, Ser: 1.9 mg/dL — ABNORMAL HIGH (ref 0.44–1.00)
GFR, Estimated: 27 mL/min — ABNORMAL LOW (ref >60)
Glucose, Bld: 134 mg/dL — ABNORMAL HIGH (ref 70–99)
Potassium: 3.9 mmol/L (ref 3.5–5.1)
Sodium: 140 mmol/L (ref 135–145)
Total Bilirubin: 0.6 mg/dL (ref 0.3–1.2)
Total Protein: 5.7 g/dL — ABNORMAL LOW (ref 6.5–8.1)

## 2020-12-06 LAB — GLUCOSE, CAPILLARY
Glucose-Capillary: 147 mg/dL — ABNORMAL HIGH (ref 70–99)
Glucose-Capillary: 140 mg/dL — ABNORMAL HIGH (ref 70–99)

## 2020-12-06 LAB — CBC WITH DIFFERENTIAL/PLATELET
Abs Immature Granulocytes: 0.11 10*3/uL — ABNORMAL HIGH (ref 0.00–0.07)
Basophils Absolute: 0 10*3/uL (ref 0.0–0.1)
Basophils Relative: 0 %
Eosinophils Absolute: 0.2 10*3/uL (ref 0.0–0.5)
Eosinophils Relative: 1 %
HCT: 41.8 % (ref 36.0–46.0)
Hemoglobin: 12.8 g/dL (ref 12.0–15.0)
Immature Granulocytes: 1 %
Lymphocytes Relative: 13 %
Lymphs Abs: 1.9 10*3/uL (ref 0.7–4.0)
MCH: 30.5 pg (ref 26.0–34.0)
MCHC: 30.6 g/dL (ref 30.0–36.0)
MCV: 99.8 fL (ref 80.0–100.0)
Monocytes Absolute: 1 10*3/uL (ref 0.1–1.0)
Monocytes Relative: 6 %
Neutro Abs: 11.6 10*3/uL — ABNORMAL HIGH (ref 1.7–7.7)
Neutrophils Relative %: 79 %
Platelets: 272 10*3/uL (ref 150–400)
RBC: 4.19 MIL/uL (ref 3.87–5.11)
RDW: 14.7 % (ref 11.5–15.5)
WBC: 14.8 10*3/uL — ABNORMAL HIGH (ref 4.0–10.5)
nRBC: 0 % (ref 0.0–0.2)

## 2020-12-06 LAB — TSH: TSH: 1.283 u[IU]/mL (ref 0.350–4.500)

## 2020-12-06 LAB — MAGNESIUM: Magnesium: 2 mg/dL (ref 1.7–2.4)

## 2020-12-06 LAB — PROTIME-INR
INR: 8.6 (ref 0.8–1.2)
Prothrombin Time: 70.7 seconds — ABNORMAL HIGH (ref 11.4–15.2)

## 2020-12-06 MED ORDER — EMPAGLIFLOZIN 10 MG PO TABS
10.0000 mg | ORAL_TABLET | Freq: Every day | ORAL | Status: DC
  Administered 2020-12-07 – 2020-12-09 (×3): 10 mg via ORAL
  Filled 2020-12-06 (×3): qty 1

## 2020-12-06 MED ORDER — AMIODARONE LOAD VIA INFUSION
150.0000 mg | Freq: Once | INTRAVENOUS | Status: AC
  Administered 2020-12-06: 150 mg via INTRAVENOUS
  Filled 2020-12-06: qty 83.34

## 2020-12-06 MED ORDER — ACETAMINOPHEN 325 MG PO TABS
650.0000 mg | ORAL_TABLET | ORAL | Status: DC | PRN
  Administered 2020-12-06 – 2020-12-09 (×3): 650 mg via ORAL
  Filled 2020-12-06 (×3): qty 2

## 2020-12-06 MED ORDER — PANTOPRAZOLE SODIUM 40 MG PO TBEC
40.0000 mg | DELAYED_RELEASE_TABLET | Freq: Every day | ORAL | Status: DC
  Administered 2020-12-07 – 2020-12-09 (×3): 40 mg via ORAL
  Filled 2020-12-06 (×3): qty 1

## 2020-12-06 MED ORDER — INSULIN ASPART 100 UNIT/ML ~~LOC~~ SOLN
0.0000 [IU] | Freq: Three times a day (TID) | SUBCUTANEOUS | Status: DC
  Administered 2020-12-06: 2 [IU] via SUBCUTANEOUS
  Administered 2020-12-08 – 2020-12-09 (×3): 3 [IU] via SUBCUTANEOUS

## 2020-12-06 MED ORDER — DOCUSATE SODIUM 100 MG PO CAPS: 100.0000 mg | ORAL_CAPSULE | Freq: Every day | ORAL | Status: DC | PRN

## 2020-12-06 MED ORDER — ALUM & MAG HYDROXIDE-SIMETH 200-200-20 MG/5ML PO SUSP
30.0000 mL | Freq: Four times a day (QID) | ORAL | Status: DC | PRN
  Administered 2020-12-06: 30 mL via ORAL
  Filled 2020-12-06: qty 30

## 2020-12-06 MED ORDER — AMIODARONE HCL IN DEXTROSE 360-4.14 MG/200ML-% IV SOLN
30.0000 mg/h | INTRAVENOUS | Status: DC
  Administered 2020-12-06 – 2020-12-07 (×3): 30 mg/h via INTRAVENOUS
  Filled 2020-12-06 (×3): qty 200

## 2020-12-06 MED ORDER — ONDANSETRON HCL 4 MG/2ML IJ SOLN: 4.0000 mg | Freq: Four times a day (QID) | INTRAMUSCULAR | Status: DC | PRN

## 2020-12-06 MED ORDER — VENLAFAXINE HCL 75 MG PO TABS
75.0000 mg | ORAL_TABLET | Freq: Every day | ORAL | Status: DC
  Administered 2020-12-07 – 2020-12-09 (×3): 75 mg via ORAL
  Filled 2020-12-06 (×3): qty 1

## 2020-12-06 MED ORDER — AMIODARONE HCL IN DEXTROSE 360-4.14 MG/200ML-% IV SOLN
60.0000 mg/h | INTRAVENOUS | Status: AC
  Administered 2020-12-06 (×2): 60 mg/h via INTRAVENOUS
  Filled 2020-12-06: qty 200

## 2020-12-06 MED ORDER — FENTANYL CITRATE (PF) 100 MCG/2ML IJ SOLN
50.0000 ug | Freq: Once | INTRAMUSCULAR | Status: DC
  Filled 2020-12-06: qty 2

## 2020-12-06 MED ORDER — LINAGLIPTIN 5 MG PO TABS
5.0000 mg | ORAL_TABLET | Freq: Every day | ORAL | Status: DC
  Administered 2020-12-06 – 2020-12-09 (×4): 5 mg via ORAL
  Filled 2020-12-06 (×4): qty 1

## 2020-12-06 MED ORDER — METOPROLOL SUCCINATE ER 25 MG PO TB24
25.0000 mg | ORAL_TABLET | Freq: Every morning | ORAL | Status: DC
  Administered 2020-12-07: 25 mg via ORAL
  Filled 2020-12-06: qty 1

## 2020-12-06 MED ORDER — INSULIN GLARGINE 100 UNIT/ML ~~LOC~~ SOLN
45.0000 [IU] | Freq: Every day | SUBCUTANEOUS | Status: DC
  Administered 2020-12-06 – 2020-12-07 (×2): 45 [IU] via SUBCUTANEOUS
  Filled 2020-12-06 (×5): qty 0.45

## 2020-12-06 MED ORDER — PROPOFOL 10 MG/ML IV BOLUS
0.5000 mg/kg | Freq: Once | INTRAVENOUS | Status: DC
  Filled 2020-12-06: qty 20

## 2020-12-06 MED ORDER — VITAMIN B-12 1000 MCG PO TABS
1000.0000 ug | ORAL_TABLET | Freq: Every day | ORAL | Status: DC
  Administered 2020-12-07 – 2020-12-09 (×3): 1000 ug via ORAL
  Filled 2020-12-06 (×3): qty 1

## 2020-12-06 MED ORDER — TADALAFIL (PAH) 20 MG PO TABS
40.0000 mg | ORAL_TABLET | Freq: Every day | ORAL | Status: DC
  Administered 2020-12-06 – 2020-12-08 (×3): 40 mg via ORAL
  Filled 2020-12-06 (×4): qty 2

## 2020-12-06 MED ORDER — MIDAZOLAM HCL 2 MG/2ML IJ SOLN
2.0000 mg | Freq: Once | INTRAMUSCULAR | Status: DC
  Filled 2020-12-06: qty 2

## 2020-12-06 MED ORDER — ATORVASTATIN CALCIUM 80 MG PO TABS
80.0000 mg | ORAL_TABLET | Freq: Every day | ORAL | Status: DC
  Administered 2020-12-06 – 2020-12-08 (×3): 80 mg via ORAL
  Filled 2020-12-06 (×4): qty 1

## 2020-12-06 MED ORDER — PHYTONADIONE 5 MG PO TABS
2.5000 mg | ORAL_TABLET | Freq: Once | ORAL | Status: AC
  Administered 2020-12-06: 2.5 mg via ORAL
  Filled 2020-12-06: qty 1

## 2020-12-06 MED ORDER — TORSEMIDE 20 MG PO TABS
20.0000 mg | ORAL_TABLET | Freq: Every day | ORAL | Status: DC
  Administered 2020-12-07: 20 mg via ORAL
  Filled 2020-12-06: qty 1

## 2020-12-06 MED ORDER — LOSARTAN POTASSIUM 25 MG PO TABS
25.0000 mg | ORAL_TABLET | Freq: Every day | ORAL | Status: DC
  Administered 2020-12-07: 25 mg via ORAL
  Filled 2020-12-06: qty 1

## 2020-12-06 MED ORDER — ACETAMINOPHEN ER 650 MG PO TBCR: 650.0000 mg | EXTENDED_RELEASE_TABLET | Freq: Three times a day (TID) | ORAL | Status: DC | PRN

## 2020-12-06 MED ORDER — METOPROLOL TARTRATE 5 MG/5ML IV SOLN
5.0000 mg | Freq: Once | INTRAVENOUS | Status: AC
  Administered 2020-12-06: 5 mg via INTRAVENOUS
  Filled 2020-12-06: qty 5

## 2020-12-06 NOTE — H&P (View-Only) (Signed)
Advanced Heart Failure Team Consult Note   Primary Physician: Birdie Sons, MD PCP-Cardiologist:  No primary care provider on file.  Reason for Consultation:  A flutter   HPI:    Ann Flynn is seen today for evaluation of A flutter at the request of Dr Sabra Heck.   Ann Flynn is a 80 year old with history of 79 year old womanwith h/omorbid obesity, DM, OSA on CPAP, CKD 4,PAD, CVA, carotid surgery in 2010,pulmonary embolism (2009) and PAH .   Reports DVT/PE in 2009 and has been on coumadin since. Was diagnosed with PAH in [facility]at [facility] in [place].Right and left heart cathat that time reported as nonobstructive CAD and severe PAH with PAP 103/19 (51) with PCWP 5. (see results below) Was told she had idiopathic PAH andstarted on Letairis and cialis. She weighed 255 pounds at the time.   Never smoked but was exposed to 2ndhand smoke. Previouslyon portable oxygen at 5L when [place] was in [place], but the portableO2 was not covered. Now "goes without oxygen", only at nightthat she wear oxygenwith CPAP. Denies h/o CTD.  I saw her for the first time in 11/20: Felt to have primarily WHO Group III PH. Echo and VQ ordered.   Had a syncopal episode in 8/21. CT head showed small 2 mm subdural hematoma. Echocardiogram was obtained, echocardiogram showed EF 55 to 60%, moderate left ventricular hypertrophy, moderately elevated pulmonary artery systolic pressure.Outpatient monitor was ordered and showed atrial flutter continuously (100% burden) with rates ranging from 50 to 133 (average 85 bpm). F/u CT scan 8/23 SDH resolved  OV 11/21 She felt more dizzy since decreasing torsemide, had a fall over Halloween. Ann Flynn was started. Had successful DCCV on 07/10/20.  Ad[facility] to [facility] 12/21  after a syncopal episodeassociated with dizziness prior to passing out. HR was in the 40-50s when EMS arrived. She admits to running out of her [name] recently. Echo 12/21 with EF 60-65%,  RV normal, RHC with moderate to severe PAH with high PA pulse pressure and high cardiac output & moderately elevated left-sided pressures. Cardiac MRI showed EF 51%, mild RV dilation, mod pulmonary regurgitation, markedly dilated main pulmonay artery, and RV insertion site LGE. Patient discharged home with Zio patch. Discharge weight 242 lbs.  Ann Flynn after Bonnie in 12/21 showed worsening PAH. Did not tolerate due to HAs, nausea and hip pain. However hip pain has persisted after stopping and found to have degenerative disc disease. She has been on combination therapy with Letairis 25m and tadalafil 40 daily  .   Yesterday she saw Dr BHaroldine Lawsand was doing ok on letairis and tadalafil. She was stable and recommendations to follow up in 6 months.   Today she called the clinic due to palpitations. EKG completed and showed A flutter RVR with rate 149. She was sent to the ED for further evaluation. INR >8 (has not had INR check since January). WBC 14.8. CXR no acute findings.   Cardiac Studies.  08/06/20 Echo EF 60-65% RV normal   cMRI12/21/21: 1. Motion artifact throughout exam affects quantification of volumes 2. No evidence of shunt with Qp/Qs = 0.94 3. Normal LV size and systolic function (EF 516% 4. Mild RV dilatation with normal systolic function (EF 410% 5. Moderate pulmonary regurgitation (regurgitant fraction 23%) 6. RV insertion site late gadolinium enhancement, which is a nonspecific finding often seen in setting of pulmonary hypertension 7. Markedly dilated main pulmonary artery, measuring 527mx 4423m. Mild mitral regurgitation (regurgitant fraction  18%), mild aortic regurgitation (regurgitant fraction 15%), and mild tricuspid regurgitation (regurgitant fraction 12%)  VQ 07/07/19: No PE  RHC  07/29/19 RA = 8 RV = 81/14 PA = 80/18 (42) PCW =17 Fick cardiac output/index = 9.25/4.25 PVR = 2.70 WU FA sat = 98% PA sat = 75%, 75% High SVC 72%  RHC 9/21 RA  = 8 RV = 65/7 PA = 61/19 (34) PCW = 19 (v= 27) Fick cardiac output/index = 8.1/3.8 PVR = 1.2 WU Ao sat = 99% PA sat = 75%, 78% SVC = 70%  RHC 12/21 RA = 11 RV = 91/14 PA = 93/20 (47) PCW = 21 Fick cardiac output/index = 8.0/3.7 PVR = 3.1 WU FA sat = 99% PA sat = 72%, 73% High SVC = 72% PAPi = 6.6   PFTs 09/28/19  FEV1 1.92 (57%) FVC 2.57 (58%) DLCO 61%  12/16/2014 right and left heart catheterization with nitric oxide inhalation, Pressure data resting: -Mean right atrial pressure 2 Right ventricule 765/4 and diastolic -Pulmonary [id number], mean 51. Pulmonary artery wedge mean 5 -Ascending thoracic aorta 111/53, mean 75. -Fick cardiac output 4.2 L. -Pulmonary resistance 12.25 Woods units. With nitric oxide inhalation -Pulmonary artery 89/29, mean 42. -Right ventricle 27/5 and diastolic. -Right atrial 2. -Fick cardiac output 4.94 L. -Pulmonary resistance 15.91 Woods units. Review of Systems: [y] = yes, _0  = no   . General: Weight gain _1 ; Weight loss _2 ; Anorexia _3 ; Fatigue _4 ; Fever _5 ; Chills _6 ; Weakness _7   . Cardiac: Chest pain/pressure _8 ; Resting SOB _9 ; Exertional SOB _10 ; Orthopnea _11 ; Pedal Edema _12 ; Palpitations _13 ; Syncope _14 ; Presyncope _15 ; Paroxysmal nocturnal dyspnea_16   . Pulmonary: Cough _17 ; Wheezing_18 ; Hemoptysis_19 ; Sputum _20 ; Snoring _21   . GI: Vomiting_22 ; Dysphagia_23 ; Melena_24 ; Hematochezia _25 ; Heartburn_26 ; Abdominal pain _27 ; Constipation _28 ; Diarrhea _29 ; BRBPR _30   . GU: Hematuria_31 ; Dysuria _32 ; Nocturia_33   . Vascular: Pain in legs with walking _34 ; Pain in feet with lying flat _35 ; Non-healing sores _36 ; Stroke _37 ; TIA _38 ; Slurred speech _39 ;  . Neuro: Headaches_40 ; Vertigo_41 ; Seizures_42 ; Paresthesias_43 ;Blurred vision _44 ; Diplopia _45 ; Vision changes _46   . Ortho/Skin: Arthritis _47 ; Joint pain _48 ; Muscle pain _49 ; Joint swelling _50 ; Back Pain _51 ; Rash _52   . Psych: Depression_53 ; Anxiety_54    . Heme: Bleeding problems _55 ; Clotting disorders _56 ; Anemia _57   . Endocrine: Diabetes _58 ; Thyroid dysfunction_59   Home Medications Prior to Admission medications   Medication Sig Start Date End Date Taking? Authorizing Provider  acetaminophen (TYLENOL) 650 MG CR tablet Take 650-1,300 mg by mouth every 8 (eight) hours as needed for pain.   Yes [provider]  ambrisentan (LETAIRIS) 5 MG tablet Take 5 mg by mouth at bedtime.   Yes [provider]  atorvastatin (LIPITOR) 80 MG tablet Take 1 tablet (80 mg total) by mouth daily. Patient taking differently: Take 80 mg by mouth at bedtime. 09/24/20  Yes Trinna Post, PA-C  Cholecalciferol (VITAMIN D3) 50 MCG (2000 UT) TABS Take 2,000 Units by mouth in the morning and at bedtime.   Yes [provider]  docusate sodium (COLACE) 100 MG capsule Take 100 mg by mouth daily as needed (constipation.).  Yes [provider]  Dulaglutide (TRULICITY) 0.26 VZ/8.5YI SOPN Inject 0.75 mg into the skin once a week. Patient taking differently: Inject 0.75 mg into the skin every Friday. 10/24/20  Yes Carles Collet M, PA-C  empagliflozin (JARDIANCE) 10 MG TABS tablet Take 1 tablet (10 mg total) by mouth daily before breakfast. 08/29/20  Yes Pollak, Adriana M, PA-C  insulin glargine, 1 Unit Dial, (TOUJEO SOLOSTAR) 300 UNIT/ML Solostar Pen INJECT 50 UNITS SUBCUTANEOUSLY ONCE DAILY Patient taking differently: Inject 45 Units into the skin at bedtime. 10/23/20  Yes Carles Collet M, PA-C  losartan (COZAAR) 25 MG tablet Take 25 mg by mouth daily.   Yes [provider]  metoprolol succinate (TOPROL-XL) 25 MG 24 hr tablet Take 25 mg by mouth in the morning.   Yes [provider]  omeprazole (PRILOSEC) 40 MG capsule Take 1 capsule (40 mg total) by mouth daily. Patient taking differently: Take 40 mg by mouth daily before breakfast. 08/29/20  Yes Pollak, Adriana M, PA-C  phenylephrine (SUDAFED PE) 10 MG TABS tablet Take  10 mg by mouth every 4 (four) hours as needed (for sinus congestion).   Yes [provider]  PRESCRIPTION MEDICATION CPAP: At bedtime   Yes [provider]  sitaGLIPtin (JANUVIA) 25 MG tablet Take 25 mg by mouth in the morning.   Yes [provider]  tadalafil, PAH, (ADCIRCA) 20 MG tablet Take 2 tablets (40 mg total) by mouth at bedtime. 04/17/20  Yes Flora Lipps, MD  torsemide (DEMADEX) 20 MG tablet Take 1 tablet (20 mg total) by mouth daily. 05/03/20  Yes Mizael Sagar, Shaune Pascal, MD  venlafaxine (EFFEXOR) 75 MG tablet Take 1 tablet (75 mg total) by mouth daily. 11/01/20  Yes Trinna Post, PA-C  vitamin B-12 (CYANOCOBALAMIN) 1000 MCG tablet Take 1,000 mcg by mouth in the morning and at bedtime.   Yes [provider]  warfarin (COUMADIN) 4 MG tablet TAKE ONE TABLET BY MOUTH ON SUNDAY, MONDAY, ONCE WEEKLY ON TUESDAY, [place], Friday. Patient taking differently: Take 4 mg by mouth at bedtime. 11/14/20  Yes Bacigalupo, Dionne Bucy, MD  BD PEN NEEDLE NANO U/F 32G X 4 MM MISC USE TO INJECT ONCE DAILY AS DIRECTED 11/30/19   Trinna Post, PA-C  [facility] ULTRA test strip USE AS DIRECTED 05/04/20   Trinna Post, PA-C    Past Medical History: Past Medical History:  Diagnosis Date  . A-fib ([place])   . Anemia   . CHF (congestive heart failure) ([place])   . Chronic kidney disease   . Diabetes mellitus without complication ([place])   . Hypertension   . Pulmonary hypertension ([place])   . Syncope 07/2020    Past Surgical History: Past Surgical History:  Procedure Laterality Date  . ABDOMINAL HYSTERECTOMY  1997  . ABDOMINAL HYSTERECTOMY    . CARDIOVERSION N/A 07/10/2020   Procedure: CARDIOVERSION;  Surgeon: Jolaine Artist, MD;  Location[facility] ENDOSCOPY;  Service: Cardiovascular;  Laterality: N/A;  . cartiod artery surgery  2010   per patient   . CHOLECYSTECTOMY  10/19/2009  . masectomy    . MASTECTOMY  1994  . RIGHT HEART CATH N/A 07/29/2019   Procedure: RIGHT  HEART CATH;  Surgeon: Jolaine Artist, MD;  Location: [place] CV LAB;  Service: Cardiovascular;  Laterality: N/A;  . RIGHT HEART CATH N/A 05/11/2020   Procedure: RIGHT HEART CATH;  Surgeon: Jolaine Artist, MD;  Location: [place] CV LAB;  Service: Cardiovascular;  Laterality: N/A;  .  RIGHT HEART CATH N/A 08/07/2020   Procedure: RIGHT HEART CATH;  Surgeon: Jolaine Artist, MD;  Location: [place] CV LAB;  Service: Cardiovascular;  Laterality: N/A;    Family History: Family History  Problem Relation Age of Onset  . CAD Mother   . Heart disease Father   . CAD Maternal Grandmother     Social History: Social History   Socioeconomic History  . Marital status: Widowed    Spouse name: Not on file  . Number of children: Not on file  . Years of education: Not on file  . Highest education level: Not on file  Occupational History  . Not on file  Tobacco Use  . Smoking status: Never Smoker  . Smokeless tobacco: Never Used  Vaping Use  . Vaping Use: Never used  Substance and Sexual Activity  . Alcohol use: Never  . Drug use: Never  . Sexual activity: Not on file  Other Topics Concern  . Not on file  Social History Narrative   ** Merged History Encounter **       Social Determinants of Health   Financial Resource Strain: Low Risk   . Difficulty of Paying Living Expenses: Not hard at all  Food Insecurity: Not on file  Transportation Needs: No Transportation Needs  . Lack of Transportation (Medical): No  . Lack of Transportation (Non-Medical): No  Physical Activity: Not on file  Stress: Not on file  Social Connections: Not on file    Allergies:  Allergies  Allergen Reactions  . Penicillins Swelling    Facial swelling Did it involve swelling of the face/tongue/throat, SOB, or low BP? Yes Did it involve sudden or severe rash/hives, skin peeling, or any reaction on the inside of your mouth or nose? No Did you need to seek medical attention at a hospital  or doctor's office? No When did it last happen?10 + years If all above answers are "NO", may proceed with cephalosporin use.   . Sulfa Antibiotics Rash  . Malvin Johns [Selexipag] Other (See Comments)    headaches    Objective:    Vital Signs:   Temp:  [98.6 F (37 C)] 98.6 F (37 C) (04/21 1215) Pulse Rate:  [36-149] 48 (04/21 1530) Resp:  [18-27] 19 (04/21 1530) BP: (94-119)/(48-91) 106/55 (04/21 1530) SpO2:  [96 %-100 %] 98 % (04/21 1530)    Weight change: There were no vitals filed for this visit.  Intake/Output:  No intake or output data in the 24 hours ending 12/06/20 1557    Physical Exam    General: No resp difficulty HEENT: normal Neck: supple. JVP does not appear elevated. Carotids 2+ bilat; no bruits. No lymphadenopathy or thyromegaly appreciated. Cor: PMI nondisplaced. Irregular rate & rhythm. No rubs, gallops or murmurs. Lungs: clear Abdomen: soft, nontender, nondistended. No hepatosplenomegaly. No bruits or masses. Good bowel sounds. Extremities: no cyanosis, clubbing, rash, edema Neuro: alert & orientedx3, cranial nerves grossly intact. moves all 4 extremities w/o difficulty. Affect pleasant   Telemetry    A Flutter 90s   EKG    Aflutter 149 on arrival to ED   Labs   Basic Metabolic Panel: Recent Labs  Lab 12/06/20 1213  NA 140  K 3.9  CL 102  CO2 30  GLUCOSE 134*  BUN 65*  CREATININE 1.90*  CALCIUM 8.5*    Liver Function Tests: Recent Labs  Lab 12/06/20 1213  AST 26  ALT 41  ALKPHOS 54  BILITOT 0.6  PROT 5.7*  ALBUMIN  3.3*   No results for input(s): LIPASE, AMYLASE in the last 168 hours. No results for input(s): AMMONIA in the last 168 hours.  CBC: Recent Labs  Lab 12/06/20 1213  WBC 14.8*  NEUTROABS 11.6*  HGB 12.8  HCT 41.8  MCV 99.8  PLT 272    Cardiac Enzymes: No results for input(s): CKTOTAL, CKMB, CKMBINDEX, TROPONINI in the last 168 hours.  BNP: BNP (last 3 results) Recent Labs    01/27/20 1150   BNP 450.6*    ProBNP (last 3 results) No results for input(s): PROBNP in the last 8760 hours.   CBG: No results for input(s): GLUCAP in the last 168 hours.  Coagulation Studies: Recent Labs    12/06/20 1213  LABPROT 70.7*  INR 8.6*     Imaging   DG Chest Port 1 View  Result Date: 12/06/2020 CLINICAL DATA:  Dizziness. EXAM: PORTABLE CHEST 1 VIEW COMPARISON:  Single-view of the chest 03/23/2020. FINDINGS: Punctate calcification projecting in the right lower lobe is unchanged. Lungs otherwise clear. Cardiomegaly. No pneumothorax or pleural effusion. Aortic atherosclerosis. No acute or focal bony abnormality. IMPRESSION: No acute disease. Cardiomegaly. Atherosclerosis. Electronically Signed   By: Inge Rise M.D.   On: 12/06/2020 13:30      Medications:     Current Medications: . amiodarone  150 mg Intravenous Once  . fentaNYL (SUBLIMAZE) injection  50 mcg Intravenous Once  . midazolam  2 mg Intravenous Once  . phytonadione  2.5 mg Oral Once  . propofol  0.5 mg/kg Intravenous Once     Infusions: . amiodarone 60 mg/hr (12/06/20 1541)   Followed by  . amiodarone         Assessment/Plan   1. A flutter RVR Had DC/CV 06/2020  Start amio drip with plan for cardioversion if she does not convert.  INR >8. She has not had INR checked over the last few months.   2. Supra Therapeutic INR Holding coumadin as noted above.   3. Pulmonary HTN (severe) with cor pulmonale -Suspect combination of WHO Group 1 & 3 disease - VQ 07/07/19: No PE - RV function remains normal on echo despite severe PAH - PFTs with significant restrictive lung physiology -PA pressures 12/21 very high butPA pulsativity and cardiac output much higher than would besuspectedin true PAH with pressures of this magnitude.  - cMRI showed no evidence of shunt, Qp/Qs=0.94. -Continue O2 supplementation and CPAP. -Continue tadalafil 40 mg and letairis 5 mg (unable to tolerate higher dose of  letairis). - Echo today 12/05/20 LVEF 60% RV normal - Unable to tolerate selexipag currently. Can consider rechallenge in the future.   4. Chronic HFpEF EF 60%RV normal  NYHA III. Volume status ok. Continue torsemide 20 mg daily   5. CKD Stage IV Creatinine baseline 1.8-2  Follow BMET daily   6. H/O PE 2009     Length of Stay: 0  Darrick Grinder, NP  12/06/2020, 3:57 PM  Advanced Heart Failure Team Pager [phone] (M-F; 7a - 5p)  Please contact [facility] for night-coverage after hours (4p -7a ) and weekends on [email or web address]  Patient seen and examined with the above-signed Advanced Practice Provider and/or Housestaff. I personally reviewed laboratory data, imaging studies and relevant notes. I independently examined the patient and formulated the important aspects of the plan. I have edited the note to reflect any of my changes or salient points. I have personally discussed the plan with the patient and/or family.  80 y/o woman with PAH. Seen yesterday  in clinic and doing well. Developed SOB and palpitations today and found to have recurrent AFL with RVR. INR 8.3  General:  Well appearing. No resp difficulty HEENT: normal Neck: supple. no JVD. Carotids 2+ bilat; no bruits. No lymphadenopathy or thryomegaly appreciated. Cor: PMI nondisplaced. Irregular rate & rhythm. No rubs, gallops or murmurs. Lungs: clear Abdomen: obese soft, nontender, nondistended. No hepatosplenomegaly. No bruits or masses. Good bowel sounds. Extremities: no cyanosis, clubbing, rash, edema Neuro: alert & orientedx3, cranial nerves grossly intact. moves all 4 extremities w/o difficulty. Affect pleasant  Agree with amio. Will consult EP for AFL ablation. She has received one dose of oral vit k for her INR.   Glori Bickers, MD  4:36 PM

## 2020-12-06 NOTE — ED Provider Notes (Signed)
[facility] EMERGENCY DEPARTMENT Provider Note   CSN: [id number] Arrival date & time: 12/06/20  1206     History Chief Complaint  Patient presents with  . Shortness of Breath    Ann Flynn is a 80 y.o. female.  HPI   This patient is a 80 year old female, she has a history of atrial fibrillation, history of congestive heart failure and chronic kidney disease, she is a diabetic and has pulmonary hypertension.  She does have a history of requiring cardioversion in the past due to her atrial for been atrial flutter.  She presents today stating that when she woke up this morning she was having palpitations, she was feeling dizzy and lightheaded and a slight bit of chest pain on the left.  She is already on warfarin, she has not missed any doses, she has not recently had her levels checked but states the last time she had them checked they were okay.  She is having no unusual bleeding, she is not short of breath or having any fevers or chills, no nausea or vomiting, no diarrhea, no swelling.  This lightheadedness and dizziness and palpitations today is persistent, nothing seems to make it better or worse, when she checked her heart rate at home it was over 140.  Past Medical History:  Diagnosis Date  . A-fib ([place])   . Anemia   . CHF (congestive heart failure) ([place])   . Chronic kidney disease   . Diabetes mellitus without complication ([place])   . Hypertension   . Pulmonary hypertension ([place])   . Syncope 07/2020    Patient Active Problem List   Diagnosis Date Noted  . DDD (degenerative disc disease), cervical 11/27/2020  . Syncope and collapse 08/05/2020  . Atrial fibrillation, chronic ([place]) 03/24/2020  . Type 2 diabetes mellitus with stage 4 chronic kidney disease ([place]) 03/24/2020  . Syncope 03/24/2020  . Pulmonary hypertension ([place]) 03/24/2020  . Subdural hematoma, acute ([place]) 03/24/2020  . Pulmonary nodules 01/30/2020  . Chronic pulmonary embolism, unspecified  pulmonary embolism type, unspecified whether acute cor pulmonale present ([place]) 10/06/2019  . Chronic respiratory failure with hypoxia ([place]) 10/06/2019  . Morbid (severe) obesity due to excess calories ([place]) 10/06/2019  . Hypertension associated with diabetes ([place]) 10/06/2019  . Hyperlipidemia associated with type 2 diabetes mellitus ([place]) 10/06/2019  . CKD (chronic kidney disease), stage IV ([place]) 04/01/2019  . Allergic rhinitis 03/28/2019  . Arthritis 03/28/2019  . CHF (congestive heart failure) ([place]) 03/28/2019  . Diabetes mellitus without complication ([place]) 24/58/0998  . Sleep apnea 03/28/2019  . Pulmonary hypertension, primary ([place]) 03/28/2019    Past Surgical History:  Procedure Laterality Date  . ABDOMINAL HYSTERECTOMY  1997  . ABDOMINAL HYSTERECTOMY    . CARDIOVERSION N/A 07/10/2020   Procedure: CARDIOVERSION;  Surgeon: Jolaine Artist, MD;  Location: [facility] ENDOSCOPY;  Service: Cardiovascular;  Laterality: N/A;  . cartiod artery surgery  2010   per patient   . CHOLECYSTECTOMY  10/19/2009  . masectomy    . MASTECTOMY  1994  . RIGHT HEART CATH N/A 07/29/2019   Procedure: RIGHT HEART CATH;  Surgeon: Jolaine Artist, MD;  Location: [facility] CV LAB;  Service: Cardiovascular;  Laterality: N/A;  . RIGHT HEART CATH N/A 05/11/2020   Procedure: RIGHT HEART CATH;  Surgeon: Jolaine Artist, MD;  Location: [place] CV LAB;  Service: Cardiovascular;  Laterality: N/A;  . RIGHT HEART CATH N/A 08/07/2020   Procedure: RIGHT HEART CATH;  Surgeon: Jolaine Artist, MD;  Location: [facility] CV LAB;  Service: Cardiovascular;  Laterality: N/A;     OB History   No obstetric history on file.     Family History  Problem Relation Age of Onset  . CAD Mother   . Heart disease Father   . CAD Maternal Grandmother     Social History   Tobacco Use  . Smoking status: Never Smoker  . Smokeless tobacco: Never Used  Vaping Use  . Vaping Use: Never used  Substance Use Topics   . Alcohol use: Never  . Drug use: Never    Home Medications Prior to Admission medications   Medication Sig Start Date End Date Taking? Authorizing Provider  acetaminophen (TYLENOL) 650 MG CR tablet Take 650-1,300 mg by mouth every 8 (eight) hours as needed for pain.   Yes [provider]  ambrisentan (LETAIRIS) 5 MG tablet Take 5 mg by mouth at bedtime.   Yes [provider]  atorvastatin (LIPITOR) 80 MG tablet Take 1 tablet (80 mg total) by mouth daily. Patient taking differently: Take 80 mg by mouth at bedtime. 09/24/20  Yes Trinna Post, PA-C  Cholecalciferol (VITAMIN D3) 50 MCG (2000 UT) TABS Take 2,000 Units by mouth in the morning and at bedtime.   Yes [provider]  Dulaglutide (TRULICITY) 1.96 QI/2.9NL SOPN Inject 0.75 mg into the skin once a week. Patient taking differently: Inject 0.75 mg into the skin every Friday. 10/24/20  Yes Carles Collet M, PA-C  empagliflozin (JARDIANCE) 10 MG TABS tablet Take 1 tablet (10 mg total) by mouth daily before breakfast. 08/29/20  Yes Pollak, Adriana M, PA-C  insulin glargine, 1 Unit Dial, (TOUJEO SOLOSTAR) 300 UNIT/ML Solostar Pen INJECT 50 UNITS SUBCUTANEOUSLY ONCE DAILY Patient taking differently: Inject 45 Units into the skin at bedtime. 10/23/20  Yes Carles Collet M, PA-C  losartan (COZAAR) 25 MG tablet Take 25 mg by mouth daily.   Yes [provider]  metoprolol succinate (TOPROL-XL) 25 MG 24 hr tablet Take 25 mg by mouth in the morning.   Yes [provider]  omeprazole (PRILOSEC) 40 MG capsule Take 1 capsule (40 mg total) by mouth daily. Patient taking differently: Take 40 mg by mouth daily before breakfast. 08/29/20  Yes Pollak, Adriana M, PA-C  PRESCRIPTION MEDICATION CPAP: At bedtime   Yes [provider]  sitaGLIPtin (JANUVIA) 25 MG tablet Take 25 mg by mouth in the morning.   Yes [provider]  tadalafil, PAH, (ADCIRCA) 20 MG tablet Take 2 tablets (40 mg total) by  mouth at bedtime. 04/17/20  Yes Flora Lipps, MD  torsemide (DEMADEX) 20 MG tablet Take 1 tablet (20 mg total) by mouth daily. 05/03/20  Yes Bensimhon, Shaune Pascal, MD  venlafaxine (EFFEXOR) 75 MG tablet Take 1 tablet (75 mg total) by mouth daily. 11/01/20  Yes Trinna Post, PA-C  warfarin (COUMADIN) 4 MG tablet TAKE ONE TABLET BY MOUTH ON SUNDAY, MONDAY, ONCE WEEKLY ON TUESDAY, THURSDAY, Friday. Patient taking differently: Take 4 mg by mouth at bedtime. 11/14/20  Yes Bacigalupo, Dionne Bucy, MD  BD PEN NEEDLE NANO U/F 32G X 4 MM MISC USE TO INJECT ONCE DAILY AS DIRECTED 11/30/19   Trinna Post, PA-C  docusate sodium (COLACE) 100 MG capsule Take 100 mg by mouth daily as needed (constipation.).     [provider]  [facility] ULTRA test strip USE AS DIRECTED 05/04/20   Trinna Post, PA-C  vitamin B-12 (CYANOCOBALAMIN) 1000 MCG tablet Take 1,000 mcg by  mouth in the morning and at bedtime.    [provider]    Allergies    Penicillins and Sulfa antibiotics  Review of Systems   Review of Systems  All other systems reviewed and are negative.   Physical Exam Updated Vital Signs BP (!) 105/55   Pulse (!) 49   Temp 98.6 F (37 C) (Oral)   Resp 19   SpO2 96%   Physical Exam Vitals and nursing note reviewed.  Constitutional:      General: She is not in acute distress.    Appearance: She is well-developed.  HENT:     Head: Normocephalic and atraumatic.     Mouth/Throat:     Pharynx: No oropharyngeal exudate.  Eyes:     General: No scleral icterus.       Right eye: No discharge.        Left eye: No discharge.     Conjunctiva/sclera: Conjunctivae normal.     Pupils: Pupils are equal, round, and reactive to light.  Neck:     Thyroid: No thyromegaly.     Vascular: No JVD.  Cardiovascular:     Rate and Rhythm: Tachycardia present.     Heart sounds: Normal heart sounds. No murmur heard. No friction rub. No gallop.      Comments: The cardiac rhythm is atrial  flutter with variable block, normal pulses at the radial arteries Pulmonary:     Effort: Pulmonary effort is normal. No respiratory distress.     Breath sounds: Normal breath sounds. No wheezing or rales.  Abdominal:     General: Bowel sounds are normal. There is no distension.     Palpations: Abdomen is soft. There is no mass.     Tenderness: There is no abdominal tenderness.  Musculoskeletal:        General: No tenderness. Normal range of motion.     Cervical back: Normal range of motion and neck supple.  Lymphadenopathy:     Cervical: No cervical adenopathy.  Skin:    General: Skin is warm and dry.     Findings: No erythema or rash.  Neurological:     Mental Status: She is alert.     Coordination: Coordination normal.  Psychiatric:        Behavior: Behavior normal.     ED Results / Procedures / Treatments   Labs (all labs ordered are listed, but only abnormal results are displayed) Labs Reviewed  CBC WITH DIFFERENTIAL/PLATELET - Abnormal; Notable for the following components:      Result Value   WBC 14.8 (*)    Neutro Abs 11.6 (*)    Abs Immature Granulocytes 0.11 (*)    All other components within normal limits  COMPREHENSIVE METABOLIC PANEL - Abnormal; Notable for the following components:   Glucose, Bld 134 (*)    BUN 65 (*)    Creatinine, Ser 1.90 (*)    Calcium 8.5 (*)    Total Protein 5.7 (*)    Albumin 3.3 (*)    GFR, Estimated 27 (*)    All other components within normal limits  PROTIME-INR - Abnormal; Notable for the following components:   Prothrombin Time 70.7 (*)    INR 8.6 (*)    All other components within normal limits    EKG EKG Interpretation  Date/Time:  Thursday December 06 2020 12:12:45 EDT Ventricular Rate:  149 PR Interval:  120 QRS Duration: 76 QT Interval:  294 QTC Calculation: 463 R Axis:   57  Text Interpretation: ST & T wave abnormality, consider inferior ischemia Abnormal ECG atrial flutter with 2:1 block Confirmed by Noemi Chapel  [phone]) on 12/06/2020 2:51:52 PM   Radiology DG Chest Port 1 View  Result Date: 12/06/2020 CLINICAL DATA:  Dizziness. EXAM: PORTABLE CHEST 1 VIEW COMPARISON:  Single-view of the chest 03/23/2020. FINDINGS: Punctate calcification projecting in the right lower lobe is unchanged. Lungs otherwise clear. Cardiomegaly. No pneumothorax or pleural effusion. Aortic atherosclerosis. No acute or focal bony abnormality. IMPRESSION: No acute disease. Cardiomegaly. Atherosclerosis. Electronically Signed   By: Inge Rise M.D.   On: 12/06/2020 13:30   ECHOCARDIOGRAM COMPLETE  Result Date: 12/05/2020    ECHOCARDIOGRAM REPORT   Patient Name:   Ann Flynn Date of Exam: 12/05/2020 Medical Rec #:  [id number]     Height:       65.0 in Accession #:    [id number]    Weight:       238.0 lb Date of Birth:  April 26, 1941     BSA:          2.130 m Patient Age:    63 years      BP:           176/65 mmHg Patient Gender: F             HR:           70 bpm. Exam Location:  Outpatient Procedure: 2D Echo, 3D Echo, Cardiac Doppler, Color Doppler and Strain Analysis Indications:    I50.40* Unspecified combined systolic (congestive) and diastolic                 (congestive) heart failure  History:        Patient has prior history of Echocardiogram examinations, most                 recent 08/06/2020. CHF, Abnormal ECG, Pulmonary HTN,                 Arrythmias:Atrial Fibrillation, Signs/Symptoms:Syncope; Risk                 Factors:Diabetes. Pulmonary embolus.  Sonographer:    Roseanna Rainbow Referring Phys: [id number] DANIEL R BENSIMHON  Sonographer Comments: Technically difficult study due to poor echo windows and patient is morbidly obese. Image acquisition challenging due to patient body habitus. Global longitudinal strain was attempted. IMPRESSIONS  1. Left ventricular ejection fraction, by estimation, is 55 to 60%. The left ventricle has normal function. The left ventricle has no regional wall motion abnormalities. There is mild left  ventricular hypertrophy. Left ventricular diastolic parameters are consistent with Grade II diastolic dysfunction (pseudonormalization).  2. Right ventricular systolic function is normal. The right ventricular size is normal.  3. Left atrial size was mild to moderately dilated.  4. Right atrial size was mildly dilated.  5. The mitral valve is normal in structure. Trivial mitral valve regurgitation. Mild mitral stenosis. Moderate mitral annular calcification.  6. The aortic valve is normal in structure. There is moderate calcification of the aortic valve. Aortic valve regurgitation is not visualized. Mild to moderate aortic valve sclerosis/calcification is present, without any evidence of aortic stenosis.  7. Aortic dilatation noted. There is mild dilatation of the aortic root, measuring 44 mm.  8. The inferior vena cava is normal in size with greater than 50% respiratory variability, suggesting right atrial pressure of 3 mmHg. FINDINGS  Left Ventricle: Left ventricular ejection fraction, by estimation, is 55 to 60%. The left ventricle has normal function.  The left ventricle has no regional wall motion abnormalities. The left ventricular internal cavity size was normal in size. There is  mild left ventricular hypertrophy. Left ventricular diastolic parameters are consistent with Grade II diastolic dysfunction (pseudonormalization). Right Ventricle: The right ventricular size is normal. No increase in right ventricular wall thickness. Right ventricular systolic function is normal. Left Atrium: Left atrial size was mild to moderately dilated. Right Atrium: Right atrial size was mildly dilated. Pericardium: There is no evidence of pericardial effusion. Mitral Valve: The mitral valve is normal in structure. There is moderate thickening of the mitral valve leaflet(s). There is mild calcification of the mitral valve leaflet(s). Moderate mitral annular calcification. Trivial mitral valve regurgitation. Mild mitral valve  stenosis. MV peak gradient, 5.3 mmHg. The mean mitral valve gradient is 2.0 mmHg. Tricuspid Valve: The tricuspid valve is normal in structure. Tricuspid valve regurgitation is trivial. No evidence of tricuspid stenosis. Aortic Valve: The aortic valve is normal in structure. There is moderate calcification of the aortic valve. Aortic valve regurgitation is not visualized. Mild to moderate aortic valve sclerosis/calcification is present, without any evidence of aortic stenosis. Pulmonic Valve: The pulmonic valve was grossly normal. Pulmonic valve regurgitation is mild. No evidence of pulmonic stenosis. Aorta: Aortic dilatation noted. There is mild dilatation of the aortic root, measuring 44 mm. Venous: The inferior vena cava is normal in size with greater than 50% respiratory variability, suggesting right atrial pressure of 3 mmHg. IAS/Shunts: No atrial level shunt detected by color flow Doppler.  LEFT VENTRICLE PLAX 2D LVIDd:         5.05 cm LVIDs:         3.30 cm LV PW:         1.55 cm LV IVS:        1.30 cm LVOT diam:     2.00 cm LV SV:         69 LV SV Index:   32 LVOT Area:     3.14 cm  LV Volumes (MOD) LV vol d, MOD A2C: 88.4 ml LV vol d, MOD A4C: 115.0 ml LV vol s, MOD A2C: 40.8 ml LV vol s, MOD A4C: 50.9 ml LV SV MOD A2C:     47.6 ml LV SV MOD A4C:     115.0 ml LV SV MOD BP:      54.1 ml RIGHT VENTRICLE            IVC RV S prime:     8.38 cm/s  IVC diam: 1.70 cm TAPSE (M-mode): 1.6 cm LEFT ATRIUM             Index       RIGHT ATRIUM           Index LA diam:        4.00 cm 1.88 cm/m  RA Area:     13.20 cm LA Vol (A2C):   74.1 ml 34.79 ml/m RA Volume:   25.10 ml  11.78 ml/m LA Vol (A4C):   41.8 ml 19.63 ml/m LA Biplane Vol: 56.3 ml 26.43 ml/m  AORTIC VALVE LVOT Vmax:   122.00 cm/s LVOT Vmean:  86.700 cm/s LVOT VTI:    0.219 m  AORTA Ao Root diam: 3.60 cm Ao Asc diam:  4.40 cm MITRAL VALVE MV Area (PHT): 2.58 cm     SHUNTS MV Area VTI:   1.30 cm     Systemic VTI:  0.22 m MV Peak grad:  5.3 mmHg  Systemic Diam: 2.00 cm MV Mean grad:  2.0 mmHg MV Vmax:       1.16 m/s MV Vmean:      63.4 cm/s MV Decel Time: 294 msec MV E velocity: 120.50 cm/s MV A velocity: 112.00 cm/s MV E/A ratio:  1.08 Glori Bickers MD Electronically signed by Glori Bickers MD Signature Date/Time: 12/05/2020/1:10:29 PM    Final     Procedures .Critical Care Performed by: Noemi Chapel, MD Authorized by: Noemi Chapel, MD   Critical care provider statement:    Critical care time (minutes):  35   Critical care time was exclusive of:  Separately billable procedures and treating other patients and teaching time   Critical care was necessary to treat or prevent imminent or life-threatening deterioration of the following conditions:  Cardiac failure   Critical care was time spent personally by me on the following activities:  Blood draw for specimens, development of treatment plan with patient or surrogate, discussions with consultants, evaluation of patient's response to treatment, examination of patient, obtaining history from patient or surrogate, ordering and performing treatments and interventions, ordering and review of laboratory studies, ordering and review of radiographic studies, pulse oximetry, re-evaluation of patient's condition and review of old charts     Medications Ordered in ED Medications  midazolam (VERSED) injection 2 mg (has no administration in time range)  fentaNYL (SUBLIMAZE) injection 50 mcg (has no administration in time range)  propofol (DIPRIVAN) 10 mg/mL bolus/IV push 53.1 mg (has no administration in time range)  phytonadione (VITAMIN K) tablet 2.5 mg (has no administration in time range)  amiodarone (NEXTERONE) 1.8 mg/mL load via infusion 150 mg (has no administration in time range)    Followed by  amiodarone (NEXTERONE PREMIX) 360-4.14 MG/200ML-% (1.8 mg/mL) IV infusion (has no administration in time range)    Followed by  amiodarone (NEXTERONE PREMIX) 360-4.14 MG/200ML-% (1.8 mg/mL)  IV infusion (has no administration in time range)  metoprolol tartrate (LOPRESSOR) injection 5 mg (5 mg Intravenous Given 12/06/20 1509)    ED Course  I have reviewed the triage vital signs and the nursing notes.  Pertinent labs & imaging results that were available during my care of the patient were reviewed by me and considered in my medical decision making (see chart for details).  Clinical Course as of 12/06/20 1516  Thu Dec 06, 2020  1313 After a discussion with the patient about the risks and the benefits and alternatives the patient agrees to cardioversion, she has signed a verbal and written consent as has the nurse and myself.  She is aware of the potential ramifications of electrocardioversion including an abnormal rhythm or potentially more deadly rhythm.  The patient also has a restricted upper extremity secondary to a long distant history of breast cancer.  There was no IV access in the left upper extremity at all, she did suggest that we could use her right arm, I think this is reasonable given that she has never had this used before, she has very good veins and at this time I think it is reasonable given her condition of atrial flutter which is rapid and needs to be cardioverted.  I placed a 22-gauge IV in her left wrist on the first attempt, radial surface, good blood draw, good flush [BM]    Clinical Course User Index [BM] Noemi Chapel, MD   MDM Rules/Calculators/A&P  Recurrent atrial arrhythmia, will plan for cardioversion if Coumadin level is okay, patient agreeable, she is not having any chest pain at this time and her vital signs are otherwise unremarkable.  After discussion with the patient and giving her the risk benefits and alternatives she has decided to move forward with cardioversion if possible.  EKG interpretation:  performed on December 06, 2020 at 12:12 PM shows regular narrow complex tachycardia at 149 bpm with underlying atrial flutter,  nonspecific ST segments and T waves, impression atrial flutter with 2-1 block, no signs of left ventricular hypertrophy, normal axis  The patient has a significant supratherapeutic state with INR greater than 8, I discussed the case with Dr. [name] of the cardiology service who will agree to consult and help with cardioversion once the INR comes down, vitamin K has been given, dose of beta-blocker has been given, will consult hospitalist for admission  They may order amiodarone -   Final Clinical Impression(s) / ED Diagnoses Final diagnoses:  Atrial flutter with rapid ventricular response ([place])  INR supratherapeutic    Noemi Chapel, MD 12/06/20 1517

## 2020-12-06 NOTE — ED Notes (Signed)
Attempted to give reportx1

## 2020-12-06 NOTE — Progress Notes (Signed)
Pt presents to office for vitals and EKG Reports she woke up this morning not feeling well. Increased HR, mildly SOB, + dizziness and dull ache/heaviness in chest.   EKG obtained and reviewed by Dr [name] along with symptoms above.  Per Dr [name] pt will need to check into ER, likely will need admission for DCCV 12/07/20

## 2020-12-06 NOTE — ED Triage Notes (Signed)
Pt presents to the ED from heart failure clinic with dizziness. Pt had an echo yesterday and patient was in NSR and today patient woke up with dizziness and slight SOB. Pt found to be in [place] with HR 145BPM. Pt chronically on 3-4L O2 for pulm HTN.

## 2020-12-06 NOTE — Consult Note (Addendum)
Advanced Heart Failure Team Consult Note   Primary Physician: Birdie Sons, MD PCP-Cardiologist:  No primary care provider on file.  Reason for Consultation:  A flutter   HPI:    Ann Flynn is seen today for evaluation of A flutter at the request of Dr Sabra Heck.   Ms Ann Flynn is a 80 year old with history of 80 year old womanwith h/omorbid obesity, DM, OSA on CPAP, CKD 4,PAD, CVA, carotid surgery in 2010,pulmonary embolism (2009) and PAH .   Reports DVT/PE in 2009 and has been on coumadin since. Was diagnosed with PAH in [facility]at [facility] hospital in [place].Right and left heart cathat that time reported as nonobstructive CAD and severe PAH with PAP 103/19 (51) with PCWP 5. (see results below) Was told she had idiopathic PAH andstarted on Letairis and cialis. She weighed 255 pounds at the time.   Never smoked but was exposed to 2ndhand smoke. Previouslyon portable oxygen at 5L when [place] was in [place], but the portableO2 was not covered. Now "goes without oxygen", only at nightthat she wear oxygenwith CPAP. Denies h/o CTD.  I saw her for the first time in 11/20: Felt to have primarily WHO Group III PH. Echo and VQ ordered.   Had a syncopal episode in 8/21. CT head showed small 2 mm subdural hematoma. Echocardiogram was obtained, echocardiogram showed EF 55 to 60%, moderate left ventricular hypertrophy, moderately elevated pulmonary artery systolic pressure.Outpatient monitor was ordered and showed atrial flutter continuously (100% burden) with rates ranging from 50 to 133 (average 85 bpm). F/u CT scan 8/23 SDH resolved  OV 11/21 She felt more dizzy since decreasing torsemide, had a fall over Halloween. [name] was started. Had successful DCCV on 07/10/20.  Admitted to [facility] 12/21  after a syncopal episodeassociated with dizziness prior to passing out. HR was in the 40-50s when EMS arrived. She admits to running out of her [name] recently. Echo 12/21 with EF 60-65%,  RV normal, RHC with moderate to severe PAH with high PA pulse pressure and high cardiac output & moderately elevated left-sided pressures. Cardiac MRI showed EF 51%, mild RV dilation, mod pulmonary regurgitation, markedly dilated main pulmonay artery, and RV insertion site LGE. Patient discharged home with Zio patch. Discharge weight 242 lbs.  [name] after [place] in 12/21 showed worsening PAH. Did not tolerate due to HAs, nausea and hip pain. However hip pain has persisted after stopping and found to have degenerative disc disease. She has been on combination therapy with Letairis 11m and tadalafil 40 daily  .   Yesterday she saw Dr BHaroldine Lawsand was doing ok on letairis and tadalafil. She was stable and recommendations to follow up in 6 months.   Today she called the clinic due to palpitations. EKG completed and showed A flutter RVR with rate 149. She was sent to the ED for further evaluation. INR >8 (has not had INR check since January). WBC 14.8. CXR no acute findings.   Cardiac Studies.  08/06/20 Echo EF 60-65% RV normal   cMRI12/21/21: 1. Motion artifact throughout exam affects quantification of volumes 2. No evidence of shunt with Qp/Qs = 0.94 3. Normal LV size and systolic function (EF 581% 4. Mild RV dilatation with normal systolic function (EF 485% 5. Moderate pulmonary regurgitation (regurgitant fraction 23%) 6. RV insertion site late gadolinium enhancement, which is a nonspecific finding often seen in setting of pulmonary hypertension 7. Markedly dilated main pulmonary artery, measuring 577mx 4448m. Mild mitral regurgitation (regurgitant fraction  18%), mild aortic regurgitation (regurgitant fraction 15%), and mild tricuspid regurgitation (regurgitant fraction 12%)  VQ 07/07/19: No PE  RHC  07/29/19 RA = 8 RV = 81/14 PA = 80/18 (42) PCW =17 Fick cardiac output/index = 9.25/4.25 PVR = 2.70 WU FA sat = 98% PA sat = 75%, 75% High SVC 72%  RHC 9/21 RA  = 8 RV = 65/7 PA = 61/19 (34) PCW = 19 (v= 27) Fick cardiac output/index = 8.1/3.8 PVR = 1.2 WU Ao sat = 99% PA sat = 75%, 78% SVC = 70%  RHC 12/21 RA = 11 RV = 91/14 PA = 93/20 (47) PCW = 21 Fick cardiac output/index = 8.0/3.7 PVR = 3.1 WU FA sat = 99% PA sat = 72%, 73% High SVC = 72% PAPi = 6.6   PFTs 09/28/19  FEV1 1.92 (57%) FVC 2.57 (58%) DLCO 61%  12/16/2014 right and left heart catheterization with nitric oxide inhalation, Pressure data resting: -Mean right atrial pressure 2 Right ventricule 765/4 and diastolic -Pulmonary [id number], mean 51. Pulmonary artery wedge mean 5 -Ascending thoracic aorta 111/53, mean 75. -Fick cardiac output 4.2 L. -Pulmonary resistance 12.25 Woods units. With nitric oxide inhalation -Pulmonary artery 89/29, mean 42. -Right ventricle 27/5 and diastolic. -Right atrial 2. -Fick cardiac output 4.94 L. -Pulmonary resistance 15.91 Woods units. Review of Systems: [y] = yes, _0  = no   . General: Weight gain _1 ; Weight loss _2 ; Anorexia _3 ; Fatigue _4 ; Fever _5 ; Chills _6 ; Weakness _7   . Cardiac: Chest pain/pressure _8 ; Resting SOB _9 ; Exertional SOB _10 ; Orthopnea _11 ; Pedal Edema _12 ; Palpitations _13 ; Syncope _14 ; Presyncope _15 ; Paroxysmal nocturnal dyspnea_16   . Pulmonary: Cough _17 ; Wheezing_18 ; Hemoptysis_19 ; Sputum _20 ; Snoring _21   . GI: Vomiting_22 ; Dysphagia_23 ; Melena_24 ; Hematochezia _25 ; Heartburn_26 ; Abdominal pain _27 ; Constipation _28 ; Diarrhea _29 ; BRBPR _30   . GU: Hematuria_31 ; Dysuria _32 ; Nocturia_33   . Vascular: Pain in legs with walking _34 ; Pain in feet with lying flat _35 ; Non-healing sores _36 ; Stroke _37 ; TIA _38 ; Slurred speech _39 ;  . Neuro: Headaches_40 ; Vertigo_41 ; Seizures_42 ; Paresthesias_43 ;Blurred vision _44 ; Diplopia _45 ; Vision changes _46   . Ortho/Skin: Arthritis _47 ; Joint pain _48 ; Muscle pain _49 ; Joint swelling _50 ; Back Pain _51 ; Rash _52   . Psych: Depression_53 ; Anxiety_54    . Heme: Bleeding problems _55 ; Clotting disorders _56 ; Anemia _57   . Endocrine: Diabetes _58 ; Thyroid dysfunction_59   Home Medications Prior to Admission medications   Medication Sig Start Date End Date Taking? Authorizing Provider  acetaminophen (TYLENOL) 650 MG CR tablet Take 650-1,300 mg by mouth every 8 (eight) hours as needed for pain.   Yes [provider]  ambrisentan (LETAIRIS) 5 MG tablet Take 5 mg by mouth at bedtime.   Yes [provider]  atorvastatin (LIPITOR) 80 MG tablet Take 1 tablet (80 mg total) by mouth daily. Patient taking differently: Take 80 mg by mouth at bedtime. 09/24/20  Yes Trinna Post, PA-C  Cholecalciferol (VITAMIN D3) 50 MCG (2000 UT) TABS Take 2,000 Units by mouth in the morning and at bedtime.   Yes [provider]  docusate sodium (COLACE) 100 MG capsule Take 100 mg by mouth daily as needed (constipation.).  Yes [provider]  Dulaglutide (TRULICITY) 0.26 VZ/8.5YI SOPN Inject 0.75 mg into the skin once a week. Patient taking differently: Inject 0.75 mg into the skin every Friday. 10/24/20  Yes Carles Collet M, PA-C  empagliflozin (JARDIANCE) 10 MG TABS tablet Take 1 tablet (10 mg total) by mouth daily before breakfast. 08/29/20  Yes Pollak, Adriana M, PA-C  insulin glargine, 1 Unit Dial, (TOUJEO SOLOSTAR) 300 UNIT/ML Solostar Pen INJECT 50 UNITS SUBCUTANEOUSLY ONCE DAILY Patient taking differently: Inject 45 Units into the skin at bedtime. 10/23/20  Yes Carles Collet M, PA-C  losartan (COZAAR) 25 MG tablet Take 25 mg by mouth daily.   Yes [provider]  metoprolol succinate (TOPROL-XL) 25 MG 24 hr tablet Take 25 mg by mouth in the morning.   Yes [provider]  omeprazole (PRILOSEC) 40 MG capsule Take 1 capsule (40 mg total) by mouth daily. Patient taking differently: Take 40 mg by mouth daily before breakfast. 08/29/20  Yes Pollak, Adriana M, PA-C  phenylephrine (SUDAFED PE) 10 MG TABS tablet Take  10 mg by mouth every 4 (four) hours as needed (for sinus congestion).   Yes [provider]  PRESCRIPTION MEDICATION CPAP: At bedtime   Yes [provider]  sitaGLIPtin (JANUVIA) 25 MG tablet Take 25 mg by mouth in the morning.   Yes [provider]  tadalafil, PAH, (ADCIRCA) 20 MG tablet Take 2 tablets (40 mg total) by mouth at bedtime. 04/17/20  Yes Flora Lipps, MD  torsemide (DEMADEX) 20 MG tablet Take 1 tablet (20 mg total) by mouth daily. 05/03/20  Yes Lisabeth Mian, Shaune Pascal, MD  venlafaxine (EFFEXOR) 75 MG tablet Take 1 tablet (75 mg total) by mouth daily. 11/01/20  Yes Trinna Post, PA-C  vitamin B-12 (CYANOCOBALAMIN) 1000 MCG tablet Take 1,000 mcg by mouth in the morning and at bedtime.   Yes [provider]  warfarin (COUMADIN) 4 MG tablet TAKE ONE TABLET BY MOUTH ON SUNDAY, MONDAY, ONCE WEEKLY ON TUESDAY, [place], Friday. Patient taking differently: Take 4 mg by mouth at bedtime. 11/14/20  Yes Bacigalupo, Dionne Bucy, MD  BD PEN NEEDLE NANO U/F 32G X 4 MM MISC USE TO INJECT ONCE DAILY AS DIRECTED 11/30/19   Trinna Post, PA-C  [facility] ULTRA test strip USE AS DIRECTED 05/04/20   Trinna Post, PA-C    Past Medical History: Past Medical History:  Diagnosis Date  . A-fib ([place])   . Anemia   . CHF (congestive heart failure) ([place])   . Chronic kidney disease   . Diabetes mellitus without complication ([place])   . Hypertension   . Pulmonary hypertension ([place])   . Syncope 07/2020    Past Surgical History: Past Surgical History:  Procedure Laterality Date  . ABDOMINAL HYSTERECTOMY  1997  . ABDOMINAL HYSTERECTOMY    . CARDIOVERSION N/A 07/10/2020   Procedure: CARDIOVERSION;  Surgeon: Jolaine Artist, MD;  Location[facility] ENDOSCOPY;  Service: Cardiovascular;  Laterality: N/A;  . cartiod artery surgery  2010   per patient   . CHOLECYSTECTOMY  10/19/2009  . masectomy    . MASTECTOMY  1994  . RIGHT HEART CATH N/A 07/29/2019   Procedure: RIGHT  HEART CATH;  Surgeon: Jolaine Artist, MD;  Location: [place] CV LAB;  Service: Cardiovascular;  Laterality: N/A;  . RIGHT HEART CATH N/A 05/11/2020   Procedure: RIGHT HEART CATH;  Surgeon: Jolaine Artist, MD;  Location: [place] CV LAB;  Service: Cardiovascular;  Laterality: N/A;  .  RIGHT HEART CATH N/A 08/07/2020   Procedure: RIGHT HEART CATH;  Surgeon: Jolaine Artist, MD;  Location: [place] CV LAB;  Service: Cardiovascular;  Laterality: N/A;    Family History: Family History  Problem Relation Age of Onset  . CAD Mother   . Heart disease Father   . CAD Maternal Grandmother     Social History: Social History   Socioeconomic History  . Marital status: Widowed    Spouse name: Not on file  . Number of children: Not on file  . Years of education: Not on file  . Highest education level: Not on file  Occupational History  . Not on file  Tobacco Use  . Smoking status: Never Smoker  . Smokeless tobacco: Never Used  Vaping Use  . Vaping Use: Never used  Substance and Sexual Activity  . Alcohol use: Never  . Drug use: Never  . Sexual activity: Not on file  Other Topics Concern  . Not on file  Social History Narrative   ** Merged History Encounter **       Social Determinants of Health   Financial Resource Strain: Low Risk   . Difficulty of Paying Living Expenses: Not hard at all  Food Insecurity: Not on file  Transportation Needs: No Transportation Needs  . Lack of Transportation (Medical): No  . Lack of Transportation (Non-Medical): No  Physical Activity: Not on file  Stress: Not on file  Social Connections: Not on file    Allergies:  Allergies  Allergen Reactions  . Penicillins Swelling    Facial swelling Did it involve swelling of the face/tongue/throat, SOB, or low BP? Yes Did it involve sudden or severe rash/hives, skin peeling, or any reaction on the inside of your mouth or nose? No Did you need to seek medical attention at a hospital  or doctor's office? No When did it last happen?10 + years If all above answers are "NO", may proceed with cephalosporin use.   . Sulfa Antibiotics Rash  . Malvin Johns [Selexipag] Other (See Comments)    headaches    Objective:    Vital Signs:   Temp:  [98.6 F (37 C)] 98.6 F (37 C) (04/21 1215) Pulse Rate:  [36-149] 48 (04/21 1530) Resp:  [18-27] 19 (04/21 1530) BP: (94-119)/(48-91) 106/55 (04/21 1530) SpO2:  [96 %-100 %] 98 % (04/21 1530)    Weight change: There were no vitals filed for this visit.  Intake/Output:  No intake or output data in the 24 hours ending 12/06/20 1557    Physical Exam    General: No resp difficulty HEENT: normal Neck: supple. JVP does not appear elevated. Carotids 2+ bilat; no bruits. No lymphadenopathy or thyromegaly appreciated. Cor: PMI nondisplaced. Irregular rate & rhythm. No rubs, gallops or murmurs. Lungs: clear Abdomen: soft, nontender, nondistended. No hepatosplenomegaly. No bruits or masses. Good bowel sounds. Extremities: no cyanosis, clubbing, rash, edema Neuro: alert & orientedx3, cranial nerves grossly intact. moves all 4 extremities w/o difficulty. Affect pleasant   Telemetry    A Flutter 90s   EKG    Aflutter 149 on arrival to ED   Labs   Basic Metabolic Panel: Recent Labs  Lab 12/06/20 1213  NA 140  K 3.9  CL 102  CO2 30  GLUCOSE 134*  BUN 65*  CREATININE 1.90*  CALCIUM 8.5*    Liver Function Tests: Recent Labs  Lab 12/06/20 1213  AST 26  ALT 41  ALKPHOS 54  BILITOT 0.6  PROT 5.7*  ALBUMIN  3.3*   No results for input(s): LIPASE, AMYLASE in the last 168 hours. No results for input(s): AMMONIA in the last 168 hours.  CBC: Recent Labs  Lab 12/06/20 1213  WBC 14.8*  NEUTROABS 11.6*  HGB 12.8  HCT 41.8  MCV 99.8  PLT 272    Cardiac Enzymes: No results for input(s): CKTOTAL, CKMB, CKMBINDEX, TROPONINI in the last 168 hours.  BNP: BNP (last 3 results) Recent Labs    01/27/20 1150   BNP 450.6*    ProBNP (last 3 results) No results for input(s): PROBNP in the last 8760 hours.   CBG: No results for input(s): GLUCAP in the last 168 hours.  Coagulation Studies: Recent Labs    12/06/20 1213  LABPROT 70.7*  INR 8.6*     Imaging   DG Chest Port 1 View  Result Date: 12/06/2020 CLINICAL DATA:  Dizziness. EXAM: PORTABLE CHEST 1 VIEW COMPARISON:  Single-view of the chest 03/23/2020. FINDINGS: Punctate calcification projecting in the right lower lobe is unchanged. Lungs otherwise clear. Cardiomegaly. No pneumothorax or pleural effusion. Aortic atherosclerosis. No acute or focal bony abnormality. IMPRESSION: No acute disease. Cardiomegaly. Atherosclerosis. Electronically Signed   By: Inge Rise M.D.   On: 12/06/2020 13:30      Medications:     Current Medications: . amiodarone  150 mg Intravenous Once  . fentaNYL (SUBLIMAZE) injection  50 mcg Intravenous Once  . midazolam  2 mg Intravenous Once  . phytonadione  2.5 mg Oral Once  . propofol  0.5 mg/kg Intravenous Once     Infusions: . amiodarone 60 mg/hr (12/06/20 1541)   Followed by  . amiodarone         Assessment/Plan   1. A flutter RVR Had DC/CV 06/2020  Start amio drip with plan for cardioversion if she does not convert.  INR >8. She has not had INR checked over the last few months.   2. Supra Therapeutic INR Holding coumadin as noted above.   3. Pulmonary HTN (severe) with cor pulmonale -Suspect combination of WHO Group 1 & 3 disease - VQ 07/07/19: No PE - RV function remains normal on echo despite severe PAH - PFTs with significant restrictive lung physiology -PA pressures 12/21 very high butPA pulsativity and cardiac output much higher than would besuspectedin true PAH with pressures of this magnitude.  - cMRI showed no evidence of shunt, Qp/Qs=0.94. -Continue O2 supplementation and CPAP. -Continue tadalafil 40 mg and letairis 5 mg (unable to tolerate higher dose of  letairis). - Echo today 12/05/20 LVEF 60% RV normal - Unable to tolerate selexipag currently. Can consider rechallenge in the future.   4. Chronic HFpEF EF 60%RV normal  NYHA III. Volume status ok. Continue torsemide 20 mg daily   5. CKD Stage IV Creatinine baseline 1.8-2  Follow BMET daily   6. H/O PE 2009     Length of Stay: 0  Darrick Grinder, NP  12/06/2020, 3:57 PM  Advanced Heart Failure Team Pager [phone] (M-F; 7a - 5p)  Please contact [facility] for night-coverage after hours (4p -7a ) and weekends on [email or web address]  Patient seen and examined with the above-signed Advanced Practice Provider and/or Housestaff. I personally reviewed laboratory data, imaging studies and relevant notes. I independently examined the patient and formulated the important aspects of the plan. I have edited the note to reflect any of my changes or salient points. I have personally discussed the plan with the patient and/or family.  80 y/o woman with PAH. Seen yesterday  in clinic and doing well. Developed SOB and palpitations today and found to have recurrent AFL with RVR. INR 8.3  General:  Well appearing. No resp difficulty HEENT: normal Neck: supple. no JVD. Carotids 2+ bilat; no bruits. No lymphadenopathy or thryomegaly appreciated. Cor: PMI nondisplaced. Irregular rate & rhythm. No rubs, gallops or murmurs. Lungs: clear Abdomen: obese soft, nontender, nondistended. No hepatosplenomegaly. No bruits or masses. Good bowel sounds. Extremities: no cyanosis, clubbing, rash, edema Neuro: alert & orientedx3, cranial nerves grossly intact. moves all 4 extremities w/o difficulty. Affect pleasant  Agree with amio. Will consult EP for AFL ablation. She has received one dose of oral vit k for her INR.   Glori Bickers, MD  4:36 PM

## 2020-12-06 NOTE — Progress Notes (Signed)
ANTICOAGULATION CONSULT NOTE - Initial Consult  Pharmacy Consult for warfarin dosing Indication: atrial fibrillation  Allergies  Allergen Reactions  . Penicillins Swelling    Facial swelling Did it involve swelling of the face/tongue/throat, SOB, or low BP? Yes Did it involve sudden or severe rash/hives, skin peeling, or any reaction on the inside of your mouth or nose? No Did you need to seek medical attention at a hospital or doctor's office? No When did it last happen?10 + years If all above answers are "NO", may proceed with cephalosporin use.   . Sulfa Antibiotics Rash  . Malvin Johns [Selexipag] Other (See Comments)    headaches    Patient Measurements:    Vital Signs: Temp: 98.6 F (37 C) (04/21 1215) Temp Source: Oral (04/21 1215) BP: 102/47 (04/21 1600) Pulse Rate: 49 (04/21 1500)  Labs: Recent Labs    12/06/20 1213  HGB 12.8  HCT 41.8  PLT 272  LABPROT 70.7*  INR 8.6*  CREATININE 1.90*    Estimated Creatinine Clearance: 29 mL/min (A) (by C-G formula based on SCr of 1.9 mg/dL (H)).   Medical History: Past Medical History:  Diagnosis Date  . A-fib ([place])   . Anemia   . CHF (congestive heart failure) ([place])   . Chronic kidney disease   . Diabetes mellitus without complication ([place])   . Hypertension   . Pulmonary hypertension ([place])   . Syncope 07/2020   Assessment: 80 year old female on warfarin PTA for afib. Pharmacy has been consulted to dose warfarin. Currently holding warfarin per Cardiology due to significantly elevated INR. She has received one dose of oral vit k at this time.   PTA Warfarin: unclear, med rec pending, possibly 4 mg daily INR was 2.7 when last checked (08/29/2020)  INRs: 8.6 Warfarin doses: HOLDING   Today, INR is supratherapeutic at 8.6. No bleeding noted. CBC wnl with Hgb 12.8, HCT 41.8, Pltc 272.    Goal of Therapy:  INR 2-3 Monitor platelets by anticoagulation protocol: Yes   Plan:  HOLD warfarin tonight  -  Monitor daily INR - Monitor for s/sx of bleeding  Claudina Lick, PharmD PGY1 Acute Care Pharmacy Resident 12/06/2020 4:34 PM  Please check [email or web address] for unit-specific pharmacy phone numbers.

## 2020-12-06 NOTE — Telephone Encounter (Signed)
[place] chf team called to reports pts HR 148 today Pt reports her heart is racing  Advised to return for EKG and vitals

## 2020-12-06 NOTE — H&P (Addendum)
History and Physical    Ann Flynn GWL:[id number] DOB: 05/17/1941 DOA: 12/06/2020  PCP: Birdie Sons, MD (Confirm with patient/family/NH records and if not entered, this has to be entered at [facility] point of entry) Patient coming from: Home  I have personally briefly reviewed patient's old medical records in [place]  Chief Complaint: Lightheaded, palpitations  HPI: Ann Flynn is a 80 y.o. female with medical history significant of PAF on Coumadin, diastolic CHF, moderate pulmonary hypertension, IDDM, HLD, CKD stage III, OSA on CPAP at bedtime, morbid obesity, came with new onset of palpitations, lightheadedness.  Patient woke up in the middle of the night feeling palpitations.  And then tripled In the morning found her heart rate 148 and irregular, meantime she felt lightheadedness and " heaviness in the chest" along with shortness of breath no cough no fever or chills.  Then she called her cardiologist who recommended she come into hospital.  In the ED, it was found patient heart rate in the 150s EKG showed a flutter with 3-1 transduction, initial plan was for cardiology to come in to have a cardioversion at bedside however it was found that patient INR significant elevated and amiodarone drip was started instead.  Patient has not checked her INR in the last month due to scheduling problems.  She denies any gum bleeding or blood in her urine or blood in the stool.   Review of Systems: As per HPI otherwise 14 point review of systems negative.    Past Medical History:  Diagnosis Date  . A-fib ([place])   . Anemia   . CHF (congestive heart failure) ([place])   . Chronic kidney disease   . Diabetes mellitus without complication ([place])   . Hypertension   . Pulmonary hypertension ([place])   . Syncope 07/2020    Past Surgical History:  Procedure Laterality Date  . ABDOMINAL HYSTERECTOMY  1997  . ABDOMINAL HYSTERECTOMY    . CARDIOVERSION N/A 07/10/2020   Procedure: CARDIOVERSION;   Surgeon: Jolaine Artist, MD;  Location: [facility] ENDOSCOPY;  Service: Cardiovascular;  Laterality: N/A;  . cartiod artery surgery  2010   per patient   . CHOLECYSTECTOMY  10/19/2009  . masectomy    . MASTECTOMY  1994  . RIGHT HEART CATH N/A 07/29/2019   Procedure: RIGHT HEART CATH;  Surgeon: Jolaine Artist, MD;  Location: [place] CV LAB;  Service: Cardiovascular;  Laterality: N/A;  . RIGHT HEART CATH N/A 05/11/2020   Procedure: RIGHT HEART CATH;  Surgeon: Jolaine Artist, MD;  Location: [place] CV LAB;  Service: Cardiovascular;  Laterality: N/A;  . RIGHT HEART CATH N/A 08/07/2020   Procedure: RIGHT HEART CATH;  Surgeon: Jolaine Artist, MD;  Location: [place] CV LAB;  Service: Cardiovascular;  Laterality: N/A;     reports that she has never smoked. She has never used smokeless tobacco. She reports that she does not drink alcohol and does not use drugs.  Allergies  Allergen Reactions  . Penicillins Swelling    Facial swelling Did it involve swelling of the face/tongue/throat, SOB, or low BP? Yes Did it involve sudden or severe rash/hives, skin peeling, or any reaction on the inside of your mouth or nose? No Did you need to seek medical attention at a hospital or doctor's office? No When did it last happen?10 + years If all above answers are "NO", may proceed with cephalosporin use.   . Sulfa Antibiotics Rash  . Malvin Johns [Selexipag] Other (See Comments)  headaches    Family History  Problem Relation Age of Onset  . CAD Mother   . Heart disease Father   . CAD Maternal Grandmother     Prior to Admission medications   Medication Sig Start Date End Date Taking? Authorizing Provider  acetaminophen (TYLENOL) 650 MG CR tablet Take 650-1,300 mg by mouth every 8 (eight) hours as needed for pain.   Yes [provider]  ambrisentan (LETAIRIS) 5 MG tablet Take 5 mg by mouth at bedtime.   Yes [provider]  atorvastatin (LIPITOR) 80 MG  tablet Take 1 tablet (80 mg total) by mouth daily. Patient taking differently: Take 80 mg by mouth at bedtime. 09/24/20  Yes Trinna Post, PA-C  Cholecalciferol (VITAMIN D3) 50 MCG (2000 UT) TABS Take 2,000 Units by mouth in the morning and at bedtime.   Yes [provider]  docusate sodium (COLACE) 100 MG capsule Take 100 mg by mouth daily as needed (constipation.).    Yes [provider]  Dulaglutide (TRULICITY) 3.29 JM/4.2AS SOPN Inject 0.75 mg into the skin once a week. Patient taking differently: Inject 0.75 mg into the skin every Friday. 10/24/20  Yes Carles Collet M, PA-C  empagliflozin (JARDIANCE) 10 MG TABS tablet Take 1 tablet (10 mg total) by mouth daily before breakfast. 08/29/20  Yes Pollak, Adriana M, PA-C  insulin glargine, 1 Unit Dial, (TOUJEO SOLOSTAR) 300 UNIT/ML Solostar Pen INJECT 50 UNITS SUBCUTANEOUSLY ONCE DAILY Patient taking differently: Inject 45 Units into the skin at bedtime. 10/23/20  Yes Carles Collet M, PA-C  losartan (COZAAR) 25 MG tablet Take 25 mg by mouth daily.   Yes [provider]  metoprolol succinate (TOPROL-XL) 25 MG 24 hr tablet Take 25 mg by mouth in the morning.   Yes [provider]  omeprazole (PRILOSEC) 40 MG capsule Take 1 capsule (40 mg total) by mouth daily. Patient taking differently: Take 40 mg by mouth daily before breakfast. 08/29/20  Yes Pollak, Adriana M, PA-C  phenylephrine (SUDAFED PE) 10 MG TABS tablet Take 10 mg by mouth every 4 (four) hours as needed (for sinus congestion).   Yes [provider]  PRESCRIPTION MEDICATION CPAP: At bedtime   Yes [provider]  sitaGLIPtin (JANUVIA) 25 MG tablet Take 25 mg by mouth in the morning.   Yes [provider]  tadalafil, PAH, (ADCIRCA) 20 MG tablet Take 2 tablets (40 mg total) by mouth at bedtime. 04/17/20  Yes Flora Lipps, MD  torsemide (DEMADEX) 20 MG tablet Take 1 tablet (20 mg total) by mouth daily. 05/03/20  Yes Bensimhon, Shaune Pascal, MD  venlafaxine (EFFEXOR) 75 MG tablet Take 1 tablet (75 mg total) by mouth daily. 11/01/20  Yes Trinna Post, PA-C  vitamin B-12 (CYANOCOBALAMIN) 1000 MCG tablet Take 1,000 mcg by mouth in the morning and at bedtime.   Yes [provider]  warfarin (COUMADIN) 4 MG tablet TAKE ONE TABLET BY MOUTH ON SUNDAY, MONDAY, ONCE WEEKLY ON TUESDAY, [place], Friday. Patient taking differently: Take 4 mg by mouth at bedtime. 11/14/20  Yes Bacigalupo, Dionne Bucy, MD  BD PEN NEEDLE NANO U/F 32G X 4 MM MISC USE TO INJECT ONCE DAILY AS DIRECTED 11/30/19   Trinna Post, PA-C  [facility] ULTRA test strip USE AS DIRECTED 05/04/20   Trinna Post, PA-C    Physical Exam: Vitals:   12/06/20 1430 12/06/20 1445 12/06/20 1500 12/06/20 1530  BP: 115/66 (!) 103/51 (!) 105/55 (!) 106/55  Pulse: (!) 36  72 (!) 49 (!) 48  Resp: (!) 23 (!) _0 Temp:      TempSrc:      SpO2: 97% 97% 96% 98%    Constitutional: NAD, calm, comfortable Vitals:   12/06/20 1430 12/06/20 1445 12/06/20 1500 12/06/20 1530  BP: 115/66 (!) 103/51 (!) 105/55 (!) 106/55  Pulse: (!) 36 72 (!) 49 (!) 48  Resp: (!) 23 (!) _1 Temp:      TempSrc:      SpO2: 97% 97% 96% 98%   Eyes: PERRL, lids and conjunctivae normal ENMT: Mucous membranes are moist. Posterior pharynx clear of any exudate or lesions.Normal dentition.  Neck: normal, supple, no masses, no thyromegaly Respiratory: clear to auscultation bilaterally, no wheezing, no crackles. Normal respiratory effort. No accessory muscle use.  Cardiovascular: Regular heart rate, no murmurs / rubs / gallops. trace extremity edema. 2+ pedal pulses. No carotid bruits.  Abdomen: no tenderness, no masses palpated. No hepatosplenomegaly. Bowel sounds positive.  Musculoskeletal: no clubbing / cyanosis. No joint deformity upper and lower extremities. Good ROM, no contractures. Normal muscle tone.  Skin: no rashes, lesions, ulcers. No induration Neurologic: CN 2-12 grossly  intact. Sensation intact, DTR normal. Strength 5/5 in all 4.  Psychiatric: Normal judgment and insight. Alert and oriented x 3. Normal mood.   )  Labs on Admission: I have personally reviewed following labs and imaging studies  CBC: Recent Labs  Lab 12/06/20 1213  WBC 14.8*  NEUTROABS 11.6*  HGB 12.8  HCT 41.8  MCV 99.8  PLT 550   Basic Metabolic Panel: Recent Labs  Lab 12/06/20 1213  NA 140  K 3.9  CL 102  CO2 30  GLUCOSE 134*  BUN 65*  CREATININE 1.90*  CALCIUM 8.5*   GFR: Estimated Creatinine Clearance: 29 mL/min (A) (by C-G formula based on SCr of 1.9 mg/dL (H)). Liver Function Tests: Recent Labs  Lab 12/06/20 1213  AST 26  ALT 41  ALKPHOS 54  BILITOT 0.6  PROT 5.7*  ALBUMIN 3.3*   No results for input(s): LIPASE, AMYLASE in the last 168 hours. No results for input(s): AMMONIA in the last 168 hours. Coagulation Profile: Recent Labs  Lab 12/06/20 1213  INR 8.6*   Cardiac Enzymes: No results for input(s): CKTOTAL, CKMB, CKMBINDEX, TROPONINI in the last 168 hours. BNP (last 3 results) No results for input(s): PROBNP in the last 8760 hours. HbA1C: No results for input(s): HGBA1C in the last 72 hours. CBG: No results for input(s): GLUCAP in the last 168 hours. Lipid Profile: No results for input(s): CHOL, HDL, LDLCALC, TRIG, CHOLHDL, LDLDIRECT in the last 72 hours. Thyroid Function Tests: No results for input(s): TSH, T4TOTAL, FREET4, T3FREE, THYROIDAB in the last 72 hours. Anemia Panel: No results for input(s): VITAMINB12, FOLATE, FERRITIN, TIBC, IRON, RETICCTPCT in the last 72 hours. Urine analysis:    Component Value Date/Time   COLORURINE YELLOW 08/05/2020 1718   APPEARANCEUR CLEAR 08/05/2020 1718   LABSPEC 1.008 08/05/2020 1718   PHURINE 5.0 08/05/2020 1718   GLUCOSEU 150 (A) 08/05/2020 1718   HGBUR SMALL (A) 08/05/2020 1718   BILIRUBINUR NEGATIVE 08/05/2020 1718   KETONESUR NEGATIVE 08/05/2020 1718   PROTEINUR NEGATIVE 08/05/2020 1718    NITRITE POSITIVE (A) 08/05/2020 1718   LEUKOCYTESUR TRACE (A) 08/05/2020 1718    Radiological Exams on Admission: DG Chest Port 1 View  Result Date: 12/06/2020 CLINICAL DATA:  Dizziness. EXAM: PORTABLE CHEST 1 VIEW COMPARISON:  Single-view of the chest 03/23/2020. FINDINGS:  Punctate calcification projecting in the right lower lobe is unchanged. Lungs otherwise clear. Cardiomegaly. No pneumothorax or pleural effusion. Aortic atherosclerosis. No acute or focal bony abnormality. IMPRESSION: No acute disease. Cardiomegaly. Atherosclerosis. Electronically Signed   By: Inge Rise M.D.   On: 12/06/2020 13:30   ECHOCARDIOGRAM COMPLETE  Result Date: 12/05/2020    ECHOCARDIOGRAM REPORT   Patient Name:   Ann Flynn Date of Exam: 12/05/2020 Medical Rec #:  [id number]     Height:       65.0 in Accession #:    [id number]    Weight:       238.0 lb Date of Birth:  25-Mar-1941     BSA:          2.130 m Patient Age:    10 years      BP:           176/65 mmHg Patient Gender: F             HR:           70 bpm. Exam Location:  Outpatient Procedure: 2D Echo, 3D Echo, Cardiac Doppler, Color Doppler and Strain Analysis Indications:    I50.40* Unspecified combined systolic (congestive) and diastolic                 (congestive) heart failure  History:        Patient has prior history of Echocardiogram examinations, most                 recent 08/06/2020. CHF, Abnormal ECG, Pulmonary HTN,                 Arrythmias:Atrial Fibrillation, Signs/Symptoms:Syncope; Risk                 Factors:Diabetes. Pulmonary embolus.  Sonographer:    Roseanna Rainbow Referring Phys: [id number] DANIEL R BENSIMHON  Sonographer Comments: Technically difficult study due to poor echo windows and patient is morbidly obese. Image acquisition challenging due to patient body habitus. Global longitudinal strain was attempted. IMPRESSIONS  1. Left ventricular ejection fraction, by estimation, is 55 to 60%. The left ventricle has normal function. The left  ventricle has no regional wall motion abnormalities. There is mild left ventricular hypertrophy. Left ventricular diastolic parameters are consistent with Grade II diastolic dysfunction (pseudonormalization).  2. Right ventricular systolic function is normal. The right ventricular size is normal.  3. Left atrial size was mild to moderately dilated.  4. Right atrial size was mildly dilated.  5. The mitral valve is normal in structure. Trivial mitral valve regurgitation. Mild mitral stenosis. Moderate mitral annular calcification.  6. The aortic valve is normal in structure. There is moderate calcification of the aortic valve. Aortic valve regurgitation is not visualized. Mild to moderate aortic valve sclerosis/calcification is present, without any evidence of aortic stenosis.  7. Aortic dilatation noted. There is mild dilatation of the aortic root, measuring 44 mm.  8. The inferior vena cava is normal in size with greater than 50% respiratory variability, suggesting right atrial pressure of 3 mmHg. FINDINGS  Left Ventricle: Left ventricular ejection fraction, by estimation, is 55 to 60%. The left ventricle has normal function. The left ventricle has no regional wall motion abnormalities. The left ventricular internal cavity size was normal in size. There is  mild left ventricular hypertrophy. Left ventricular diastolic parameters are consistent with Grade II diastolic dysfunction (pseudonormalization). Right Ventricle: The right ventricular size is normal. No increase in right ventricular wall thickness. Right ventricular systolic  function is normal. Left Atrium: Left atrial size was mild to moderately dilated. Right Atrium: Right atrial size was mildly dilated. Pericardium: There is no evidence of pericardial effusion. Mitral Valve: The mitral valve is normal in structure. There is moderate thickening of the mitral valve leaflet(s). There is mild calcification of the mitral valve leaflet(s). Moderate mitral annular  calcification. Trivial mitral valve regurgitation. Mild mitral valve stenosis. MV peak gradient, 5.3 mmHg. The mean mitral valve gradient is 2.0 mmHg. Tricuspid Valve: The tricuspid valve is normal in structure. Tricuspid valve regurgitation is trivial. No evidence of tricuspid stenosis. Aortic Valve: The aortic valve is normal in structure. There is moderate calcification of the aortic valve. Aortic valve regurgitation is not visualized. Mild to moderate aortic valve sclerosis/calcification is present, without any evidence of aortic stenosis. Pulmonic Valve: The pulmonic valve was grossly normal. Pulmonic valve regurgitation is mild. No evidence of pulmonic stenosis. Aorta: Aortic dilatation noted. There is mild dilatation of the aortic root, measuring 44 mm. Venous: The inferior vena cava is normal in size with greater than 50% respiratory variability, suggesting right atrial pressure of 3 mmHg. IAS/Shunts: No atrial level shunt detected by color flow Doppler.  LEFT VENTRICLE PLAX 2D LVIDd:         5.05 cm LVIDs:         3.30 cm LV PW:         1.55 cm LV IVS:        1.30 cm LVOT diam:     2.00 cm LV SV:         69 LV SV Index:   32 LVOT Area:     3.14 cm  LV Volumes (MOD) LV vol d, MOD A2C: 88.4 ml LV vol d, MOD A4C: 115.0 ml LV vol s, MOD A2C: 40.8 ml LV vol s, MOD A4C: 50.9 ml LV SV MOD A2C:     47.6 ml LV SV MOD A4C:     115.0 ml LV SV MOD BP:      54.1 ml RIGHT VENTRICLE            IVC RV S prime:     8.38 cm/s  IVC diam: 1.70 cm TAPSE (M-mode): 1.6 cm LEFT ATRIUM             Index       RIGHT ATRIUM           Index LA diam:        4.00 cm 1.88 cm/m  RA Area:     13.20 cm LA Vol (A2C):   74.1 ml 34.79 ml/m RA Volume:   25.10 ml  11.78 ml/m LA Vol (A4C):   41.8 ml 19.63 ml/m LA Biplane Vol: 56.3 ml 26.43 ml/m  AORTIC VALVE LVOT Vmax:   122.00 cm/s LVOT Vmean:  86.700 cm/s LVOT VTI:    0.219 m  AORTA Ao Root diam: 3.60 cm Ao Asc diam:  4.40 cm MITRAL VALVE MV Area (PHT): 2.58 cm     SHUNTS MV Area VTI:    1.30 cm     Systemic VTI:  0.22 m MV Peak grad:  5.3 mmHg     Systemic Diam: 2.00 cm MV Mean grad:  2.0 mmHg MV Vmax:       1.16 m/s MV Vmean:      63.4 cm/s MV Decel Time: 294 msec MV E velocity: 120.50 cm/s MV A velocity: 112.00 cm/s MV E/A ratio:  1.08 Glori Bickers MD Electronically signed by Quillian Quince  Bensimhon MD Signature Date/Time: 12/05/2020/1:10:29 PM    Final     EKG: Independently reviewed.  A flutter with 3-1 transduction Assessment/Plan Active Problems:   Atrial fibrillation, chronic (HCC)   A-fib (HCC)  (please populate well all problems here in Problem List. (For example, if patient is on BP meds at home and you resume or decide to hold them, it is a problem that needs to be her. Same for CAD, COPD, HLD and so on)  A. flutter with RVR -Amiodarone loading -Hold Coumadin.  Near syncope -Likely secondary to a flutter, recheck after heart rate control.  PT evaluation  Coumadin induced coagulopathy -No obvious bleeding, H&H remained stable -Received vitamin K x1, hold Coumadin indefinitely, plan to trend INR  HTN -Resume home BP meds of metoprolol and losartan  IDDM -Continue Lantus and p.o. diabetic medications -Add sliding scale  PAH -Mild fluid overload, continue torsemide -Continue Tadalafil.  Leukocytosis -Probably reactive, denies any fever chills, no cough no urinary symptoms no diarrhea.  Monitor off antibiotics for now.  CKD stage III -Mild fluid overload, continue p.o. torsemide  DVT prophylaxis: INR=8 Code Status: Full Code Family Communication: None at bedside Disposition Plan: Expect more than 2 midnight hospital stay for amiodarone loading may need cardioversion, defer to cardiologist.  Also will need to monitor INR level to come down Consults called: Cardiology Admission status: PCU   Lequita Halt MD [facility] Pager [phone]  12/06/2020, 4:09 PM

## 2020-12-06 NOTE — ED Triage Notes (Addendum)
Emergency Medicine Provider Triage Evaluation Note  Ann Flynn , a 80 y.o. female  was evaluated in triage.  Pt complains of irregular heart rate, dizziness, on Coumadin. Seen yesterday and was in sinus, today reported to be in flutter with rates of 130s. Has not had recent INR check From heart failure clinic.  Review of Systems  Positive: Light headed, palpitations   Negative: CP, SHOB, dizziness  Physical Exam  There were no vitals taken for this visit. Gen:   Awake, no distress   HEENT:  Atraumatic  Resp:  Normal effort, on baseline 3L [place] Cardiac:  Rapid rate, irregular Abd:   Nondistended, nontender  MSK:   Moves extremities without difficulty  Neuro:  Speech clear   Medical Decision Making  Medically screening exam initiated at 12:08 PM.  Appropriate orders placed.  Ann Flynn was informed that the remainder of the evaluation will be completed by another provider, this initial triage assessment does not replace that evaluation, and the importance of remaining in the ED until their evaluation is complete.  Clinical Impression     Ann Learn, PA-C 12/06/20 1212    Ann Learn, PA-C 12/06/20 1213

## 2020-12-07 ENCOUNTER — Encounter (HOSPITAL_COMMUNITY)
Admission: EM | Disposition: A | Discharge status: Home / Self Care | Payer: Self-pay | Source: Home / Self Care | Attending: Internal Medicine

## 2020-12-07 ENCOUNTER — Other Ambulatory Visit (HOSPITAL_COMMUNITY): Payer: Self-pay

## 2020-12-07 ENCOUNTER — Inpatient Hospital Stay (HOSPITAL_COMMUNITY): Payer: Medicare Other | Admitting: Certified Registered Nurse Anesthetist

## 2020-12-07 ENCOUNTER — Encounter (HOSPITAL_COMMUNITY): Payer: Self-pay | Admitting: Internal Medicine

## 2020-12-07 DIAGNOSIS — I4891 Unspecified atrial fibrillation: Secondary | ICD-10-CM

## 2020-12-07 HISTORY — PX: CARDIOVERSION: SHX1299

## 2020-12-07 LAB — BASIC METABOLIC PANEL
Anion gap: 9 (ref 5–15)
BUN: 63 mg/dL — ABNORMAL HIGH (ref 8–23)
CO2: 27 mmol/L (ref 22–32)
Calcium: 8.1 mg/dL — ABNORMAL LOW (ref 8.9–10.3)
Chloride: 102 mmol/L (ref 98–111)
Creatinine, Ser: 1.94 mg/dL — ABNORMAL HIGH (ref 0.44–1.00)
GFR, Estimated: 26 mL/min — ABNORMAL LOW (ref >60)
Glucose, Bld: 86 mg/dL (ref 70–99)
Potassium: 3.6 mmol/L (ref 3.5–5.1)
Sodium: 138 mmol/L (ref 135–145)

## 2020-12-07 LAB — CBC
HCT: 35 % — ABNORMAL LOW (ref 36.0–46.0)
Hemoglobin: 10.9 g/dL — ABNORMAL LOW (ref 12.0–15.0)
MCH: 30.4 pg (ref 26.0–34.0)
MCHC: 31.1 g/dL (ref 30.0–36.0)
MCV: 97.5 fL (ref 80.0–100.0)
Platelets: 220 10*3/uL (ref 150–400)
RBC: 3.59 MIL/uL — ABNORMAL LOW (ref 3.87–5.11)
RDW: 14.6 % (ref 11.5–15.5)
WBC: 16.6 10*3/uL — ABNORMAL HIGH (ref 4.0–10.5)
nRBC: 0 % (ref 0.0–0.2)

## 2020-12-07 LAB — GLUCOSE, CAPILLARY
Glucose-Capillary: 66 mg/dL — ABNORMAL LOW (ref 70–99)
Glucose-Capillary: 75 mg/dL (ref 70–99)
Glucose-Capillary: 92 mg/dL (ref 70–99)
Glucose-Capillary: 95 mg/dL (ref 70–99)
Glucose-Capillary: 83 mg/dL (ref 70–99)

## 2020-12-07 LAB — MAGNESIUM: Magnesium: 1.9 mg/dL (ref 1.7–2.4)

## 2020-12-07 LAB — PROTIME-INR
INR: 4.9 (ref 0.8–1.2)
Prothrombin Time: 45.9 seconds — ABNORMAL HIGH (ref 11.4–15.2)

## 2020-12-07 LAB — SARS CORONAVIRUS 2 (TAT 6-24 HRS): SARS Coronavirus 2: NEGATIVE

## 2020-12-07 SURGERY — CARDIOVERSION
Anesthesia: General

## 2020-12-07 MED ORDER — LIDOCAINE 2% (20 MG/ML) 5 ML SYRINGE
INTRAMUSCULAR | Status: DC | PRN
  Administered 2020-12-07: 60 mg via INTRAVENOUS

## 2020-12-07 MED ORDER — PROPOFOL 10 MG/ML IV BOLUS
INTRAVENOUS | Status: DC | PRN
  Administered 2020-12-07: 70 mg via INTRAVENOUS

## 2020-12-07 MED ORDER — SODIUM CHLORIDE 0.9 % IV BOLUS
500.0000 mL | Freq: Once | INTRAVENOUS | Status: AC
  Administered 2020-12-07: 500 mL via INTRAVENOUS

## 2020-12-07 MED ORDER — AMIODARONE HCL 200 MG PO TABS
200.0000 mg | ORAL_TABLET | Freq: Every day | ORAL | Status: DC
  Administered 2020-12-08 – 2020-12-09 (×2): 200 mg via ORAL
  Filled 2020-12-07 (×2): qty 1

## 2020-12-07 MED ORDER — PHENYLEPHRINE 40 MCG/ML (10ML) SYRINGE FOR IV PUSH (FOR BLOOD PRESSURE SUPPORT)
PREFILLED_SYRINGE | INTRAVENOUS | Status: DC | PRN
  Administered 2020-12-07: 120 ug via INTRAVENOUS
  Administered 2020-12-07: 80 ug via INTRAVENOUS

## 2020-12-07 NOTE — Anesthesia Postprocedure Evaluation (Signed)
Anesthesia Post Note  Patient: Ann Flynn  Procedure(s) Performed: CARDIOVERSION (N/A )     Patient location during evaluation: Endoscopy Anesthesia Type: General Level of consciousness: awake and alert Pain management: pain level controlled Vital Signs Assessment: post-procedure vital signs reviewed and stable Respiratory status: spontaneous breathing, nonlabored ventilation, respiratory function stable and patient connected to nasal cannula oxygen Cardiovascular status: blood pressure returned to baseline and stable Postop Assessment: no apparent nausea or vomiting Anesthetic complications: no   No complications documented.  Last Vitals:  Vitals:   12/07/20 1610 12/07/20 1631  BP: (!) 102/26 (!) 107/47  Pulse: 65 69  Resp: 20 19  Temp:  36.8 C  SpO2: 100% 100%    Last Pain:  Vitals:   12/07/20 1631  TempSrc: Oral  PainSc:                  Ann Flynn

## 2020-12-07 NOTE — Evaluation (Signed)
Physical Therapy Evaluation Patient Details Name: Ann Flynn MRN: [id number] DOB: 1940-08-30 Today's Date: 12/07/2020   History of Present Illness  Pt is a 80 y.o. female admitted 12/06/20 with SOB, lightheadedness, palpitations; found to be in rapid atrial flutter. Planned cardioversion 4/22. PMH includes CKD IV, pulmonary HTN, PAF on Coumadin, CHF, IDDM, CKD 3, OSA on CPAP, obesity.    Clinical Impression  Pt presents with an overall decrease in functional mobility secondary to above. PTA, pt mod independent with mobility, using rollator for community ambulation, pt drives, lives alone with daughter nearby who can assist as needed. Today, pt tolerated brief bout of standing activity with rollator and min guard. Pt limited by c/o head "swimming" while seated and standing. Pt would benefit from continued acute PT services to maximize functional mobility and independence prior to d/c home.   Seated BP 91/55, HR 112-122 Return to supine BP 106/56, HR 91    Follow Up Recommendations No PT follow up;Supervision - Intermittent (pending progress)    Equipment Recommendations  None recommended by PT    Recommendations for Other Services       Precautions / Restrictions Precautions Precautions: Fall;Other (comment) Precaution Comments: Watch HR, BP; wears 3L O2 baseline Restrictions Weight Bearing Restrictions: No      Mobility  Bed Mobility Overal bed mobility: Modified Independent             General bed mobility comments: Supine<>sit mod indep with HOB elevated    Transfers Overall transfer level: Needs assistance Equipment used: 4-wheeled walker Transfers: Sit to/from Stand Sit to Stand: Min guard         General transfer comment: Min guard for balance due to c/o dizziness, no assist required  Ambulation/Gait             General Gait Details: Deferred secondary to symptomatic hypotension  Stairs            Wheelchair Mobility    Modified Rankin  (Stroke Patients Only)       Balance Overall balance assessment: Needs assistance   Sitting balance-Leahy Scale: Good       Standing balance-Leahy Scale: Fair Standing balance comment: Can static stand without UE support, stability improved with UE support on rollator; balance not challenged                             Pertinent Vitals/Pain Pain Assessment: No/denies pain    Home Living Family/patient expects to be discharged to:: Private residence Living Arrangements: Alone Available Help at Discharge: Family;Available 24 hours/day Type of Home: House (townhouse) Home Access: Stairs to enter Entrance Stairs-Rails: None Entrance Stairs-Number of Steps: 1 Home Layout: Two level;Able to live on main level with bedroom/bathroom Home Equipment: Shower seat - built in;Walker - 4 wheels Additional Comments: Can stay at daughter's home if help needed at d/c    Prior Function Level of Independence: Independent with assistive device(s)         Comments: Mod indep community ambulator with rollator, does not typically use in house; drives. Enjoys reading, sewing     Hand Dominance        Extremity/Trunk Assessment   Upper Extremity Assessment Upper Extremity Assessment: Overall WFL for tasks assessed    Lower Extremity Assessment Lower Extremity Assessment: Generalized weakness    Cervical / Trunk Assessment Cervical / Trunk Assessment: Normal  Communication   Communication: No difficulties  Cognition Arousal/Alertness: Awake/alert Behavior During Therapy: WFL for  tasks assessed/performed Overall Cognitive Status: Within Functional Limits for tasks assessed                                        General Comments General comments (skin integrity, edema, etc.): Pt c/o head "swimming" upon sitting and standing, unable to maintain static standing long enough for successful BP reading; session limited by arrival of transport for  cardioversion. Seated BP 91/55, HR 112-122; return to supine BP 106/56, HR 91    Exercises     Assessment/Plan    PT Assessment Patient needs continued PT services  PT Problem List Decreased strength;Decreased balance;Decreased activity tolerance;Decreased mobility;Cardiopulmonary status limiting activity       PT Treatment Interventions DME instruction;Gait training;Stair training;Functional mobility training;Therapeutic activities;Therapeutic exercise;Balance training;Patient/family education    PT Goals (Current goals can be found in the Care Plan section)  Acute Rehab PT Goals Patient Stated Goal: Feel better, return home PT Goal Formulation: With patient Time For Goal Achievement: 12/21/20 Potential to Achieve Goals: Good    Frequency Min 3X/week   Barriers to discharge        Co-evaluation               AM-PAC PT "6 Clicks" Mobility  Outcome Measure Help needed turning from your back to your side while in a flat bed without using bedrails?: None Help needed moving from lying on your back to sitting on the side of a flat bed without using bedrails?: None Help needed moving to and from a bed to a chair (including a wheelchair)?: A Little Help needed standing up from a chair using your arms (e.g., wheelchair or bedside chair)?: A Little Help needed to walk in hospital room?: A Little Help needed climbing 3-5 steps with a railing? : A Little 6 Click Score: 20    End of Session Equipment Utilized During Treatment: Oxygen Activity Tolerance: Treatment limited secondary to medical complications (Comment);Patient limited by fatigue Patient left: in bed;with call bell/phone within reach;Other (comment) (with transport) Nurse Communication: Mobility status PT Visit Diagnosis: Other abnormalities of gait and mobility (R26.89);Muscle weakness (generalized) (M62.81)    Time: 0263-7858 PT Time Calculation (min) (ACUTE ONLY): 13 min   Charges:   PT Evaluation $PT Eval  Moderate Complexity: [place], PT, DPT Acute Rehabilitation Services  Pager [phone] Office [place] 12/07/2020, 2:00 PM

## 2020-12-07 NOTE — Progress Notes (Signed)
Date and time results received:12/07/20   Test: INR Critical Value: 4.9  Time Notified: 0520  Will continue to monitor

## 2020-12-07 NOTE — Consult Note (Addendum)
ELECTROPHYSIOLOGY CONSULT NOTE    Patient ID: Ann Flynn MRN: [id number], DOB/AGE: 05-10-1941 80 y.o.  Admit date: 12/06/2020 Date of Consult: 12/07/2020  Primary Physician: Birdie Sons, MD Primary Cardiologist: No primary care provider on file.  Electrophysiologist: New to Dr. Curt Bears  Referring Provider: Dr. [name]   Patient Profile: Ann Flynn is a 80 y.o. female with a history of morbid obesity (Body mass index is 38.79 kg/m.), DM2, OSA on CPAP, CKD 4, PAD, h/o CVA, carotid surgery 2010, PE 2009, and severe PAH who is being seen today for the evaluation of Atrial flutter at the request of Dr. [name].  HPI:  Ann Flynn is a 80 y.o. female with complicated medical history as above.  Followed by HF clinic since 06/2019.   Had a syncopal episode in 8/21. CT head showed small 2 mm subdural hematoma. Echocardiogram showed EF 55 to 60%, moderate LVH, moderately elevated pulmonary artery systolic pressure.Outpatient monitor  showed atrial flutter continuously (100% burden) with rates ranging from 50 to 133 (average 85 bpm). F/u CT scan 8/23 SDH resolved  OV 11/21 She felt more dizzy with down titration fo diuretics Had successful DCCV on 07/10/20.  Admitted to [facility] 12/21after syncopal episodeassociated with dizziness prior to passing out. HR was in the 40-50s when EMS arrived. Had run out of Letairis [facility] meD). Echo 12/21with EF 60-65%, RV normal, RHC with moderate to severe PAH with high PA pulse pressure and high cardiac output &moderately elevated left-sided pressures. Cardiac MRI showed EF 51%, mild RV dilation, mod pulmonary regurgitation, markedly dilated main pulmonay artery, and RV insertion site LGE. Patient discharged home with Zio patch which showed 1st degree AV block, 1 NSVT, and many short runs of SVT, longest 1 minute. Discharge weight 242 lbs.  Ann Flynn after [place] in 12/21 showed worsening PAH. Did not tolerate due to HAs, nausea and hip pain.  However hip pain has persisted after stopping and found to have degenerative disc disease. She has been on combination therapy with Letairis 62m and tadalafil 40 daily.   Seen in HF clinic 4/20 and was overall doing well.   Called 4/21 with palpitations and SOB. EKG showed AFlutter with rates in the 140s. Sent to ED.   Pertinent labs on admission include Cr 1.9 which is near baseline. K 3.9. TSH 1.283  WBC 14.8 and INR >8. Given Vitamin K.  Echo 12/05/20 with LVEF 55-60%, normal RV, mild/mod LAE, mild RAE, Grade 2 DD.  Currently, she is feeling well at rest. No SOB or palpitations. Was overall feeling well prior to yesterday.   Past Medical History:  Diagnosis Date  . A-fib ([place]   . Anemia   . CHF (congestive heart failure) ([place]   . Chronic kidney disease   . Diabetes mellitus without complication ([place]   . Hypertension   . Pulmonary hypertension ([place]   . Syncope 07/2020     Surgical History:  Past Surgical History:  Procedure Laterality Date  . ABDOMINAL HYSTERECTOMY  1997  . ABDOMINAL HYSTERECTOMY    . CARDIOVERSION N/A 07/10/2020   Procedure: CARDIOVERSION;  Surgeon: BJolaine Artist MD;  [facility];  Service: Cardiovascular;  Laterality: N/A;  . cartiod artery surgery  2010   per patient   . CHOLECYSTECTOMY  10/19/2009  . masectomy    . MASTECTOMY  1994  . RIGHT HEART CATH N/A 07/29/2019   Procedure: RIGHT HEART CATH;  Surgeon: BJolaine Artist MD;  [facility];  Service:  Cardiovascular;  Laterality: N/A;  . RIGHT HEART CATH N/A 05/11/2020   Procedure: RIGHT HEART CATH;  Surgeon: Jolaine Artist, MD;  Location: [place] [facility] LAB;  Service: Cardiovascular;  Laterality: N/A;  . RIGHT HEART CATH N/A 08/07/2020   Procedure: RIGHT HEART CATH;  Surgeon: Jolaine Artist, MD;  Location: [place] [facility];  Service: Cardiovascular;  Laterality: N/A;     Medications Prior to Admission  Medication Sig Dispense Refill Last Dose   . acetaminophen (TYLENOL) 650 MG CR tablet Take 650-1,300 mg by mouth every 8 (eight) hours as needed for pain.   Past Week at Unknown time  . ambrisentan (LETAIRIS) 5 MG tablet Take 5 mg by mouth at bedtime.   12/05/2020 at pm  . atorvastatin (LIPITOR) 80 MG tablet Take 1 tablet (80 mg total) by mouth daily. (Patient taking differently: Take 80 mg by mouth at bedtime.) 90 tablet 2 12/05/2020 at pm  . Cholecalciferol (VITAMIN D3) 50 MCG (2000 UT) TABS Take 2,000 Units by mouth in the morning and at bedtime.   12/06/2020 at am  . docusate sodium (COLACE) 100 MG capsule Take 100 mg by mouth daily as needed (constipation.).    unk  . Dulaglutide (TRULICITY) 8.52 DP/8.2UM SOPN Inject 0.75 mg into the skin once a week. (Patient taking differently: Inject 0.75 mg into the skin every Friday.) 2 mL 1 11/30/2020 at Unknown time  . empagliflozin (JARDIANCE) 10 MG TABS tablet Take 1 tablet (10 mg total) by mouth daily before breakfast. 30 tablet 6 12/06/2020 at am  . insulin glargine, 1 Unit Dial, (TOUJEO SOLOSTAR) 300 UNIT/ML Solostar Pen INJECT 50 UNITS SUBCUTANEOUSLY ONCE DAILY (Patient taking differently: Inject 45 Units into the skin at bedtime.) 4.5 mL 1 12/05/2020 at 2130  . losartan (COZAAR) 25 MG tablet Take 25 mg by mouth daily.   12/06/2020 at am  . metoprolol succinate (TOPROL-XL) 25 MG 24 hr tablet Take 25 mg by mouth in the morning.   12/06/2020 at 0900  . omeprazole (PRILOSEC) 40 MG capsule Take 1 capsule (40 mg total) by mouth daily. (Patient taking differently: Take 40 mg by mouth daily before breakfast.) 90 capsule 1 12/06/2020 at am  . phenylephrine (SUDAFED PE) 10 MG TABS tablet Take 10 mg by mouth every 4 (four) hours as needed (for sinus congestion).   unk  . PRESCRIPTION MEDICATION CPAP: At bedtime   12/05/2020 at pm  . sitaGLIPtin (JANUVIA) 25 MG tablet Take 25 mg by mouth in the morning.   Past Week at Unknown time  . tadalafil, PAH, (ADCIRCA) 20 MG tablet Take 2 tablets (40 mg total) by mouth at  bedtime. 180 tablet 3 12/05/2020 at pm  . torsemide (DEMADEX) 20 MG tablet Take 1 tablet (20 mg total) by mouth daily. 90 tablet 2 12/06/2020 at am  . venlafaxine (EFFEXOR) 75 MG tablet Take 1 tablet (75 mg total) by mouth daily. 90 tablet 0 12/06/2020 at am  . vitamin B-12 (CYANOCOBALAMIN) 1000 MCG tablet Take 1,000 mcg by mouth in the morning and at bedtime.   12/06/2020 at Unknown time  . warfarin (COUMADIN) 4 MG tablet TAKE ONE TABLET BY MOUTH ON SUNDAY, MONDAY, ONCE WEEKLY ON TUESDAY, THURSDAY, Friday. (Patient taking differently: Take 4 mg by mouth at bedtime.) 30 tablet 1 12/05/2020 at 2100  . BD PEN NEEDLE NANO U/F 32G X 4 MM MISC USE TO INJECT ONCE DAILY AS DIRECTED 100 each 2 N/A  . ONETOUCH ULTRA test strip USE AS DIRECTED  100 each 1 N/A    Inpatient Medications:  . atorvastatin  80 mg Oral QHS  . empagliflozin  10 mg Oral QAC breakfast  . fentaNYL (SUBLIMAZE) injection  50 mcg Intravenous Once  . insulin aspart  0-15 Units Subcutaneous TID WC  . insulin glargine  45 Units Subcutaneous QHS  . linagliptin  5 mg Oral Daily  . losartan  25 mg Oral Daily  . metoprolol succinate  25 mg Oral q AM  . midazolam  2 mg Intravenous Once  . pantoprazole  40 mg Oral Daily  . propofol  0.5 mg/kg Intravenous Once  . tadalafil (PAH)  40 mg Oral QHS  . torsemide  20 mg Oral Daily  . venlafaxine  75 mg Oral Daily  . vitamin B-12  1,000 mcg Oral Daily    Allergies:  Allergies  Allergen Reactions  . Penicillins Swelling    Facial swelling Did it involve swelling of the face/tongue/throat, SOB, or low BP? Yes Did it involve sudden or severe rash/hives, skin peeling, or any reaction on the inside of your mouth or nose? No Did you need to seek medical attention at a hospital or doctor's office? No When did it last happen?10 + years If all above answers are "NO", may proceed with cephalosporin use.   . Sulfa Antibiotics Rash  . Malvin Johns [Selexipag] Other (See Comments)    headaches     Social History   Socioeconomic History  . Marital status: Widowed    Spouse name: Not on file  . Number of children: Not on file  . Years of education: Not on file  . Highest education level: Not on file  Occupational History  . Not on file  Tobacco Use  . Smoking status: Never Smoker  . Smokeless tobacco: Never Used  Vaping Use  . Vaping Use: Never used  Substance and Sexual Activity  . Alcohol use: Never  . Drug use: Never  . Sexual activity: Not on file  Other Topics Concern  . Not on file  Social History Narrative   ** Merged History Encounter **       Social Determinants of Health   Financial Resource Strain: Low Risk   . Difficulty of Paying Living Expenses: Not hard at all  Food Insecurity: Not on file  Transportation Needs: No Transportation Needs  . Lack of Transportation (Medical): No  . Lack of Transportation (Non-Medical): No  Physical Activity: Not on file  Stress: Not on file  Social Connections: Not on file  Intimate Partner Violence: Not on file     Family History  Problem Relation Age of Onset  . CAD Mother   . Heart disease Father   . CAD Maternal Grandmother      Review of Systems: All other systems reviewed and are otherwise negative except as noted above.  Physical Exam: Vitals:   12/07/20 0332 12/07/20 0500 12/07/20 0748 12/07/20 0805  BP: (!) 107/56  (!) 101/50   Pulse: (!) 47  (!) 48   Resp: 18  18   Temp: 98.2 F (36.8 C)  (!) 97.4 F (36.3 C)   TempSrc: Oral  Oral   SpO2: 95%  95% 97%  Weight:  105.7 kg    Height:        GEN- The patient is elderly appearing, alert and oriented x 3 today.   HEENT: normocephalic, atraumatic; sclera clear, conjunctiva pink; hearing intact; oropharynx clear; neck supple Lungs- Clear to ausculation bilaterally, normal work of breathing.  No wheezes, rales, rhonchi Heart- Regular rate and rhythm, no murmurs, rubs or gallops GI- soft, non-tender, non-distended, bowel sounds  present Extremities- no clubbing, cyanosis, or edema; DP/PT/radial pulses 2+ bilaterally MS- no significant deformity or atrophy Skin- warm and dry, no rash or lesion Psych- euthymic mood, full affect Neuro- strength and sensation are intact  Labs:   Lab Results  Component Value Date   WBC 16.6 (H) 12/07/2020   HGB 10.9 (L) 12/07/2020   HCT 35.0 (L) 12/07/2020   MCV 97.5 12/07/2020   PLT 220 12/07/2020    Recent Labs  Lab 12/06/20 1213 12/07/20 0244  NA 140 138  K 3.9 3.6  CL 102 102  CO2 30 27  BUN 65* 63*  CREATININE 1.90* 1.94*  CALCIUM 8.5* 8.1*  PROT 5.7*  --   BILITOT 0.6  --   ALKPHOS 54  --   ALT 41  --   AST 26  --   GLUCOSE 134* 86      Radiology/Studies: DG Lumbar Spine Complete  Result Date: 11/29/2020 CLINICAL DATA:  Acute lower back and left hip pain without known injury. EXAM: LUMBAR SPINE - COMPLETE 4+ VIEW COMPARISON:  None. FINDINGS: No fracture or spondylolisthesis is noted. Severe degenerative disc disease is noted at L4-5. Mild degenerative disc disease is noted at L2-3 and L3-4. IMPRESSION: Multilevel degenerative disc disease. No acute abnormality seen in the lumbar spine. Aortic Atherosclerosis (ICD10-I70.0). Electronically Signed   By: Marijo Conception M.D.   On: 11/29/2020 08:52   DG Chest Port 1 View  Result Date: 12/06/2020 CLINICAL DATA:  Dizziness. EXAM: PORTABLE CHEST 1 VIEW COMPARISON:  Single-view of the chest 03/23/2020. FINDINGS: Punctate calcification projecting in the right lower lobe is unchanged. Lungs otherwise clear. Cardiomegaly. No pneumothorax or pleural effusion. Aortic atherosclerosis. No acute or focal bony abnormality. IMPRESSION: No acute disease. Cardiomegaly. Atherosclerosis. Electronically Signed   By: Inge Rise M.D.   On: 12/06/2020 13:30   ECHOCARDIOGRAM COMPLETE  Result Date: 12/05/2020    ECHOCARDIOGRAM REPORT   Patient Name:   LAPORCHIA NAKAJIMA Date of Exam: 12/05/2020 Medical Rec #:  [id number]     Height:        65.0 in Accession #:    [id number]    Weight:       238.0 lb Date of Birth:  [birth year]     BSA:          2.130 m Patient Age:    73 years      BP:           176/65 mmHg Patient Gender: F             HR:           70 bpm. Exam Location:  Outpatient Procedure: 2D Echo, 3D Echo, Cardiac Doppler, Color Doppler and Strain Analysis Indications:    I50.40* Unspecified combined systolic (congestive) and diastolic                 (congestive) heart failure  History:        Patient has prior history of Echocardiogram examinations, most                 recent 08/06/2020. CHF, Abnormal ECG, Pulmonary HTN,                 Arrythmias:Atrial Fibrillation, Signs/Symptoms:Syncope; Risk                 Factors:Diabetes. Pulmonary embolus.  Sonographer:  Roseanna Rainbow Referring Phys: [id number] DANIEL R BENSIMHON  Sonographer Comments: Technically difficult study due to poor echo windows and patient is morbidly obese. Image acquisition challenging due to patient body habitus. Global longitudinal strain was attempted. IMPRESSIONS  1. Left ventricular ejection fraction, by estimation, is 55 to 60%. The left ventricle has normal function. The left ventricle has no regional wall motion abnormalities. There is mild left ventricular hypertrophy. Left ventricular diastolic parameters are consistent with Grade II diastolic dysfunction (pseudonormalization).  2. Right ventricular systolic function is normal. The right ventricular size is normal.  3. Left atrial size was mild to moderately dilated.  4. Right atrial size was mildly dilated.  5. The mitral valve is normal in structure. Trivial mitral valve regurgitation. Mild mitral stenosis. Moderate mitral annular calcification.  6. The aortic valve is normal in structure. There is moderate calcification of the aortic valve. Aortic valve regurgitation is not visualized. Mild to moderate aortic valve sclerosis/calcification is present, without any evidence of aortic stenosis.  7. Aortic dilatation  noted. There is mild dilatation of the aortic root, measuring 44 mm.  8. The inferior vena cava is normal in size with greater than 50% respiratory variability, suggesting right atrial pressure of 3 mmHg. FINDINGS  Left Ventricle: Left ventricular ejection fraction, by estimation, is 55 to 60%. The left ventricle has normal function. The left ventricle has no regional wall motion abnormalities. The left ventricular internal cavity size was normal in size. There is  mild left ventricular hypertrophy. Left ventricular diastolic parameters are consistent with Grade II diastolic dysfunction (pseudonormalization). Right Ventricle: The right ventricular size is normal. No increase in right ventricular wall thickness. Right ventricular systolic function is normal. Left Atrium: Left atrial size was mild to moderately dilated. Right Atrium: Right atrial size was mildly dilated. Pericardium: There is no evidence of pericardial effusion. Mitral Valve: The mitral valve is normal in structure. There is moderate thickening of the mitral valve leaflet(s). There is mild calcification of the mitral valve leaflet(s). Moderate mitral annular calcification. Trivial mitral valve regurgitation. Mild mitral valve stenosis. MV peak gradient, 5.3 mmHg. The mean mitral valve gradient is 2.0 mmHg. Tricuspid Valve: The tricuspid valve is normal in structure. Tricuspid valve regurgitation is trivial. No evidence of tricuspid stenosis. Aortic Valve: The aortic valve is normal in structure. There is moderate calcification of the aortic valve. Aortic valve regurgitation is not visualized. Mild to moderate aortic valve sclerosis/calcification is present, without any evidence of aortic stenosis. Pulmonic Valve: The pulmonic valve was grossly normal. Pulmonic valve regurgitation is mild. No evidence of pulmonic stenosis. Aorta: Aortic dilatation noted. There is mild dilatation of the aortic root, measuring 44 mm. Venous: The inferior vena cava is  normal in size with greater than 50% respiratory variability, suggesting right atrial pressure of 3 mmHg. IAS/Shunts: No atrial level shunt detected by color flow Doppler.  LEFT VENTRICLE PLAX 2D LVIDd:         5.05 cm LVIDs:         3.30 cm LV PW:         1.55 cm LV IVS:        1.30 cm LVOT diam:     2.00 cm LV SV:         69 LV SV Index:   32 LVOT Area:     3.14 cm  LV Volumes (MOD) LV vol d, MOD A2C: 88.4 ml LV vol d, MOD A4C: 115.0 ml LV vol s, MOD A2C: 40.8 ml LV  vol s, MOD A4C: 50.9 ml LV SV MOD A2C:     47.6 ml LV SV MOD A4C:     115.0 ml LV SV MOD BP:      54.1 ml RIGHT VENTRICLE            IVC RV S prime:     8.38 cm/s  IVC diam: 1.70 cm TAPSE (M-mode): 1.6 cm LEFT ATRIUM             Index       RIGHT ATRIUM           Index LA diam:        4.00 cm 1.88 cm/m  RA Area:     13.20 cm LA Vol (A2C):   74.1 ml 34.79 ml/m RA Volume:   25.10 ml  11.78 ml/m LA Vol (A4C):   41.8 ml 19.63 ml/m LA Biplane Vol: 56.3 ml 26.43 ml/m  AORTIC VALVE LVOT Vmax:   122.00 cm/s LVOT Vmean:  86.700 cm/s LVOT VTI:    0.219 m  AORTA Ao Root diam: 3.60 cm Ao Asc diam:  4.40 cm MITRAL VALVE MV Area (PHT): 2.58 cm     SHUNTS MV Area VTI:   1.30 cm     Systemic VTI:  0.22 m MV Peak grad:  5.3 mmHg     Systemic Diam: 2.00 cm MV Mean grad:  2.0 mmHg MV Vmax:       1.16 m/s MV Vmean:      63.4 cm/s MV Decel Time: 294 msec MV E velocity: 120.50 cm/s MV A velocity: 112.00 cm/s MV E/A ratio:  1.08 Glori Bickers MD Electronically signed by Glori Bickers MD Signature Date/Time: 12/05/2020/1:10:29 PM    Final     EKG: overnight shows typical appearing atrial flutter at 95 bpm (personally reviewed)  TELEMETRY: Atrial flutter with variable conduction in 70-90s (personally reviewed)  Assessment/Plan: 1.  Typical appearing atrial flutter Echo 12/05/20 EF 55-60%, LA diam 4.0 cm, RA volume 21.1 ml, RA Area 13.2 cm2. S/p [facility] 06/2020 and now back with RVR and decompensated CHF/Pulmonary HTN Management complicated by  supra-therapeutic INR -> corrected with Vitamin K and trending down. Poor candidate for tikosyn with CKD IV.  Would plan stabilization with amiodarone +/- cardioversion then close outpatient follow up for Flutter ablation consideration (Scheduled 12/27/2020 with Dr. Curt Bears)   2. [facility] HF team following  3. OSA on CPAP States she wears her CPAP "most nights"  4. CKD IV Baseline appears ~1.6 - 1.9.    For questions or updates, please contact [facility] Please consult [email or web address] for contact info under Cardiology/STEMI.  Signed, Shirley Friar, PA-C  12/07/2020 9:12 AM   I have seen and examined this patient with Oda Kilts.  Agree with above, note added to reflect my findings.  On exam, irregular, no murmurs.  Patient was admitted to the hospital after being found short of breath with palpitations.  On presentation the emergency room, she was found to be in rapid atrial flutter.  She has since been put on amiodarone.  She does have a history of pulmonary hypertension, CKD stage IV.  She has a normal ejection fraction.  She would be a good ablation candidate.  Unfortunately, her INR is 4.8 today and thus procedures are prohibitive.  We Floride Hutmacher arrange for follow-up in EP clinic to discuss further plans for ablation.  She would be amenable per our discussion today.  Agree with continuing amiodarone.  Cardioversion is also a reasonable  option until ablation is performed.  Navjot Loera M. Cande Mastropietro MD 12/07/2020 11:10 AM

## 2020-12-07 NOTE — Interval H&P Note (Signed)
History and Physical Interval Note:  12/07/2020 2:17 PM  Ann Flynn  has presented today for surgery, with the diagnosis of aflutter.  The various methods of treatment have been discussed with the patient and family. After consideration of risks, benefits and other options for treatment, the patient has consented to  Procedure(s): CARDIOVERSION (N/A) as a surgical intervention.  The patient's history has been reviewed, patient examined, no change in status, stable for surgery.  I have reviewed the patient's chart and labs.  Questions were answered to the patient's satisfaction.     [name]

## 2020-12-07 NOTE — Transfer of Care (Signed)
Immediate Anesthesia Transfer of Care Note  Patient: Ann Flynn  Procedure(s) Performed: CARDIOVERSION (N/A )  Patient Location: Endoscopy Unit  Anesthesia Type:General  Level of Consciousness: drowsy  Airway & Oxygen Therapy: Patient Spontanous Breathing  Post-op Assessment: Report given to RN and Post -op Vital signs reviewed and stable  Post vital signs: Reviewed and stable  Last Vitals:  Vitals Value Taken Time  BP    Temp    Pulse    Resp    SpO2      Last Pain:  Vitals:   12/07/20 1403  TempSrc: Oral  PainSc: 0-No pain      Patients Stated Pain Goal: 1 (76/72/09 4709)  Complications: No complications documented.

## 2020-12-07 NOTE — Progress Notes (Signed)
Inpatient Diabetes Program Recommendations  AACE/ADA: New Consensus Statement on Inpatient Glycemic Control (2015)  Target Ranges:  Prepandial:   less than 140 mg/dL      Peak postprandial:   less than 180 mg/dL (1-2 hours)      Critically ill patients:  140 - 180 mg/dL   Lab Results  Component Value Date   GLUCAP 66 (L) 12/07/2020   HGBA1C 7.6 (A) 10/23/2020    Review of Glycemic Control Results for Ann Flynn, Ann Flynn (MRN [id number]) as of 12/07/2020 10:40  Ref. Range 12/06/2020 18:00 12/06/2020 20:56 12/07/2020 07:42  Glucose-Capillary Latest Ref Range: 70 - 99 mg/dL 147 (H) 140 (H) 66 (L)   Diabetes history: DM 2 Outpatient Diabetes medications: Toujeo 45 units, januvia 25 mg Daily, Trulicity 8.95 mg QFriday, Jardiance 10 mg Daily Current orders for Inpatient glycemic control:  Jardiance 10 mg Daily, Tradjenta 5 mg Daily Lantus 45 units Novolog 0-15 units tid  A1c 7.6% on 3/8  Inpatient Diabetes Program Recommendations:    Mild Hypoglycemia 66 this am. If pt remains in hospital consider:  -  Reducing Lantus to 40 units  Thanks,  Tama Headings RN, MSN, BC-ADM Inpatient Diabetes Coordinator Team Pager [phone] (8a-5p)

## 2020-12-07 NOTE — Progress Notes (Signed)
PROGRESS NOTE  Ann Flynn LYY:[id number] DOB: 1941-04-22 DOA: 12/06/2020 PCP: Birdie Sons, MD  HPI/Recap of past 24 hours:  Ann Flynn is a 80 y.o. female with medical history significant of PAF on Coumadin, diastolic CHF, moderate pulmonary hypertension, IDDM, HLD, CKD stage III, OSA on CPAP at bedtime, morbid obesity, came with new onset of palpitations, lightheadedness.  Patient woke up in the middle of the night feeling palpitations.  And then tripled In the morning found her heart rate 148 and irregular, meantime she felt lightheadedness and " heaviness in the chest" along with shortness of breath no cough no fever or chills.  Then she called her cardiologist who recommended she come into hospital.  In the ED, it was found patient heart rate in the 150s EKG showed a flutter with 3-1 transduction, initial plan was for cardiology to come in to have a cardioversion at bedside however it was found that patient INR significant elevated and amiodarone drip was started instead.  Patient has not checked her INR in the last month due to scheduling problems.  She denies any gum bleeding or blood in her urine or blood in the stool.  12/07/20: Seen and examined at bedside this morning.  She reports generalized fatigue.  States she has had palpitations but none at the time of this visit.  Assessment/Plan: Active Problems:   Atrial fibrillation, chronic (HCC)   A-fib (HCC)  A flutter with RVR Was started on amiodarone drip Rate is currently controlled. She also on Coumadin for CVA prevention however supratherapeutic. Coumadin held was given p.o. vitamin K 2.5 mg x 1 dose. Continue to monitor on telemetry Cardiology consulted and following.  Supratherapeutic INR Presented with INR greater than 8 INR trending down post 1 dose of p.o. vitamin K, 2.5 mg. Repeat INR. Continue to hold off Coumadin Patient states she cannot afford older oral anticoagulant  AKI on CKD 3B, suspect prerenal  in the setting of dehydration Baseline creatinine 1.6 with GFR of 31 Presented with creatinine of 1.94 with GFR of 26 Avoid nephrotoxic agent Monitor urine output Repeat renal panel in the morning.  Persistent leukocytosis Chest x-ray nonacute.  Generalized weakness PT OT to assess Fall precautions Encourage oral intake.  Pulmonary artery hypertension Continue home regimen Cardiology following  Type 2 diabetes Hold off home oral hypoglycemics Continue insulin sliding scale  Obesity BMI 38 Recommend weight loss outpatient with regular physical activity and healthy dieting.    Code Status: Full code.  Family Communication: None at bedside.  Disposition Plan: Likely will discharge to previous setting on 12/08/2020.   Consultants:  Cardiology.  Procedures:  None.  Antimicrobials:  None  DVT prophylaxis: Supratherapeutic on Coumadin.  Status is: Inpatient   Dispo:  Patient From: Home  Planned Disposition: Home, possibly tomorrow 12/08/2020.  Medically stable for discharge: No          Objective: Vitals:   12/07/20 1545 12/07/20 1600 12/07/20 1610 12/07/20 1631  BP: (!) 95/22 (!) 96/36 (!) 102/26 (!) 107/47  Pulse: (!) 55  65 69  Resp: _0 Temp:    98.2 F (36.8 C)  TempSrc:    Oral  SpO2: 99%  100% 100%  Weight:      Height:        Intake/Output Summary (Last 24 hours) at 12/07/2020 1632 Last data filed at 12/07/2020 0318 Gross per 24 hour  Intake 359.62 ml  Output --  Net 359.62 ml   [employer]  12/07/20 0500  Weight: 105.7 kg    Exam:  . General: 80 y.o. year-old female well developed well nourished in no acute distress.  Alert and oriented x3. . Cardiovascular: Regular rate and rhythm with no rubs or gallops.  No thyromegaly or JVD noted.   [name] Respiratory: Clear to auscultation with no wheezes or rales. Good inspiratory effort. . Abdomen: Soft nontender nondistended with normal bowel sounds x4  quadrants. . Musculoskeletal: No lower extremity edema bilaterally. . Skin: No ulcerative lesions noted or rashes, . Psychiatry: Mood is appropriate for condition and setting   Data Reviewed: CBC: Recent Labs  Lab 12/06/20 1213 12/07/20 0244  WBC 14.8* 16.6*  NEUTROABS 11.6*  --   HGB 12.8 10.9*  HCT 41.8 35.0*  MCV 99.8 97.5  PLT 272 497   Basic Metabolic Panel: Recent Labs  Lab 12/06/20 1213 12/06/20 1709 12/07/20 0244  NA 140  --  138  K 3.9  --  3.6  CL 102  --  102  CO2 30  --  27  GLUCOSE 134*  --  86  BUN 65*  --  63*  CREATININE 1.90*  --  1.94*  CALCIUM 8.5*  --  8.1*  MG  --  2.0 1.9   GFR: Estimated Creatinine Clearance: 28.4 mL/min (A) (by C-G formula based on SCr of 1.94 mg/dL (H)). Liver Function Tests: Recent Labs  Lab 12/06/20 1213  AST 26  ALT 41  ALKPHOS 54  BILITOT 0.6  PROT 5.7*  ALBUMIN 3.3*   No results for input(s): LIPASE, AMYLASE in the last 168 hours. No results for input(s): AMMONIA in the last 168 hours. Coagulation Profile: Recent Labs  Lab 12/06/20 1213 12/07/20 0244  INR 8.6* 4.9*   Cardiac Enzymes: No results for input(s): CKTOTAL, CKMB, CKMBINDEX, TROPONINI in the last 168 hours. BNP (last 3 results) No results for input(s): PROBNP in the last 8760 hours. HbA1C: No results for input(s): HGBA1C in the last 72 hours. CBG: Recent Labs  Lab 12/06/20 2056 12/07/20 0742 12/07/20 0814 12/07/20 0844 12/07/20 1157  GLUCAP 140* 66* 75 92 95   Lipid Profile: No results for input(s): CHOL, HDL, LDLCALC, TRIG, CHOLHDL, LDLDIRECT in the last 72 hours. Thyroid Function Tests: Recent Labs    12/06/20 1709  TSH 1.283   Anemia Panel: No results for input(s): VITAMINB12, FOLATE, FERRITIN, TIBC, IRON, RETICCTPCT in the last 72 hours. Urine analysis:    Component Value Date/Time   COLORURINE YELLOW 08/05/2020 [place] 08/05/2020 1718   LABSPEC 1.008 08/05/2020 1718   PHURINE 5.0 08/05/2020 1718    GLUCOSEU 150 (A) 08/05/2020 1718   HGBUR SMALL (A) 08/05/2020 1718   BILIRUBINUR NEGATIVE 08/05/2020 1718   KETONESUR NEGATIVE 08/05/2020 1718   PROTEINUR NEGATIVE 08/05/2020 1718   NITRITE POSITIVE (A) 08/05/2020 1718   LEUKOCYTESUR TRACE (A) 08/05/2020 1718   Sepsis Labs: _0 (procalcitonin:4,lacticidven:4)  ) Recent Results (from the past 240 hour(s))  SARS CORONAVIRUS 2 (TAT 6-24 HRS) Nasopharyngeal Nasopharyngeal Swab     Status: None   Collection Time: 12/06/20  6:33 PM   Specimen: Nasopharyngeal Swab  Result Value Ref Range Status   SARS Coronavirus 2 NEGATIVE NEGATIVE Final    Comment: (NOTE) SARS-CoV-2 target nucleic acids are NOT DETECTED.  The SARS-CoV-2 RNA is generally detectable in upper and lower respiratory specimens during the acute phase of infection. Negative results do not preclude SARS-CoV-2 infection, do not rule out co-infections with other pathogens, and should not be used  as the sole basis for treatment or other patient management decisions. Negative results must be combined with clinical observations, patient history, and epidemiological information. The expected result is Negative.  Fact Sheet for Patients: SugarRoll.be  Fact Sheet for Healthcare Providers: https://www.woods-mathews.com/  This test is not yet approved or cleared by the [place] FDA and  has been authorized for detection and/or diagnosis of SARS-CoV-2 by FDA under an Emergency Use Authorization (EUA). This EUA will remain  in effect (meaning this test can be used) for the duration of the COVID-19 declaration under Se ction 564(b)(1) of the Act, 21 U.S.C. section 360bbb-3(b)(1), unless the authorization is terminated or revoked sooner.  Performed at [facility], [place] [address], [place], [place] [address]       Studies: No results found.  Scheduled Meds: . atorvastatin  80 mg Oral QHS  . empagliflozin  10 mg Oral  QAC breakfast  . fentaNYL (SUBLIMAZE) injection  50 mcg Intravenous Once  . insulin aspart  0-15 Units Subcutaneous TID WC  . insulin glargine  45 Units Subcutaneous QHS  . linagliptin  5 mg Oral Daily  . losartan  25 mg Oral Daily  . metoprolol succinate  25 mg Oral q AM  . midazolam  2 mg Intravenous Once  . pantoprazole  40 mg Oral Daily  . propofol  0.5 mg/kg Intravenous Once  . tadalafil (PAH)  40 mg Oral QHS  . torsemide  20 mg Oral Daily  . venlafaxine  75 mg Oral Daily  . vitamin B-12  1,000 mcg Oral Daily    Continuous Infusions: . amiodarone 30 mg/hr (12/07/20 0328)     LOS: 1 day     Kayleen Memos, MD [facility] Pager [phone]  If 7PM-7AM, please contact night-coverage [email or web address] Password [facility] 12/07/2020, 4:32 PM

## 2020-12-07 NOTE — CV Procedure (Signed)
DIRECT CURRENT CARDIOVERSION  NAME:  Ann Flynn   MRN: [id number] DOB:  December 06, 1940   ADMIT DATE: 12/06/2020   INDICATIONS: Atrial flutter   PROCEDURE:   Informed consent was obtained prior to the procedure. The risks, benefits and alternatives for the procedure were discussed and the patient comprehended these risks. Once an appropriate time out was taken, the patient had the defibrillator pads placed in the anterior and posterior position. The patient then underwent sedation by the anesthesia service. Once an appropriate level of sedation was achieved, the patient received a single biphasic, synchronized 200J shock with prompt conversion to sinus rhythm. No apparent complications.  Glori Bickers, MD  2:51 PM

## 2020-12-07 NOTE — Progress Notes (Addendum)
[place] for warfarin dosing Indication: atrial fibrillation  Allergies  Allergen Reactions  . Penicillins Swelling    Facial swelling Did it involve swelling of the face/tongue/throat, SOB, or low BP? Yes Did it involve sudden or severe rash/hives, skin peeling, or any reaction on the inside of your mouth or nose? No Did you need to seek medical attention at a hospital or doctor's office? No When did it last happen?10 + years If all above answers are "NO", may proceed with cephalosporin use.   . Sulfa Antibiotics Rash  . Malvin Johns [Selexipag] Other (See Comments)    headaches    Patient Measurements: Height: _0  (165.1 cm) Weight: 105.7 kg (233 lb 1.6 oz) IBW/kg (Calculated) : 57  Vital Signs: Temp: 97.4 F (36.3 C) (04/22 0748) Temp Source: Oral (04/22 0748) BP: 101/50 (04/22 0748) Pulse Rate: 48 (04/22 0748)  Labs: Recent Labs    12/06/20 1213 12/07/20 0244  HGB 12.8 10.9*  HCT 41.8 35.0*  PLT 272 220  LABPROT 70.7* 45.9*  INR 8.6* 4.9*  CREATININE 1.90* 1.94*    Estimated Creatinine Clearance: 28.4 mL/min (A) (by C-G formula based on SCr of 1.94 mg/dL (H)).   Medical History: Past Medical History:  Diagnosis Date  . A-fib ([place])   . Anemia   . CHF (congestive heart failure) ([place])   . Chronic kidney disease   . Diabetes mellitus without complication ([place])   . Hypertension   . Pulmonary hypertension ([place])   . Syncope 07/2020   Assessment: 80 year old female on warfarin PTA for afib. Pharmacy has been consulted to dose warfarin. Currently holding warfarin per Cardiology due to significantly elevated INR. She has received one dose of oral vit k at this time. In the past cost of DOACs have been prohibitive but Eliquis copay is $28.30 and Xarelto copay is $27.64  -INR 8.6>>4.9  PTA Warfarin:  Last know dose 4 mg daily  Goal of Therapy:  INR 2-3 Monitor platelets by anticoagulation protocol: Yes   Plan:   -hold warfarin tonight  - Monitor daily INR -Could consider changing to a DOAC  Hildred Laser, PharmD Clinical Pharmacist **Pharmacist phone directory can now be found on [email or web address] ([email or web address]).  Listed under [place].

## 2020-12-07 NOTE — Anesthesia Preprocedure Evaluation (Signed)
Anesthesia Evaluation  Patient identified by MRN, date of birth, ID band Patient awake    Reviewed: Allergy & Precautions, H&P , NPO status , Patient's Chart, lab work & pertinent test results  Airway Mallampati: II   Neck ROM: full    Dental   Pulmonary sleep apnea ,    breath sounds clear to auscultation       Cardiovascular hypertension, +CHF  + dysrhythmias Atrial Fibrillation  Rhythm:regular Rate:Normal  EF 55-60%   Neuro/Psych    GI/Hepatic   Endo/Other  diabetes, Type 2obese  Renal/GU Renal InsufficiencyRenal disease     Musculoskeletal  (+) Arthritis ,   Abdominal   Peds  Hematology   Anesthesia Other Findings   Reproductive/Obstetrics                             Anesthesia Physical Anesthesia Plan  ASA: III  Anesthesia Plan: General   Post-op Pain Management:    Induction: Intravenous  PONV Risk Score and Plan: 3 and Propofol infusion and Treatment may vary due to age or medical condition  Airway Management Planned: Mask  Additional Equipment:   Intra-op Plan:   Post-operative Plan:   Informed Consent: I have reviewed the patients History and Physical, chart, labs and discussed the procedure including the risks, benefits and alternatives for the proposed anesthesia with the patient or authorized representative who has indicated his/her understanding and acceptance.     Dental advisory given  Plan Discussed with: CRNA, Anesthesiologist and Surgeon  Anesthesia Plan Comments:         Anesthesia Quick Evaluation

## 2020-12-07 NOTE — Progress Notes (Signed)
Pt was complaining of mid epigastric pain stated that it felt like "pressure", standing order for Maalox administered. Pt reassessed with little relief, 12-lead EKG obtained. Pt sat up in the bed and belched, stated that she felt better. Will continue to monitor.   Elaina Hoops, RN

## 2020-12-07 NOTE — TOC Benefit Eligibility Note (Signed)
Patient Teacher, English as a foreign language completed.    The patient is currently admitted and upon discharge could be taking Eliquis 5 mg.  The current 30 day co-pay is, $28.30.   The patient is currently admitted and upon discharge could be taking Xarelto 20 mg.  The current 30 day co-pay is, $27.64.   The patient is insured through [place], [place] Patient Advocate Specialist [place] Team Direct Number: [phone]  Fax: [phone]

## 2020-12-07 NOTE — Anesthesia Procedure Notes (Signed)
Procedure Name: General with mask airway Performed by: Valda Favia, CRNA Pre-anesthesia Checklist: Patient identified, Emergency Drugs available, Suction available, Patient being monitored and Timeout performed Patient Re-evaluated:Patient Re-evaluated prior to induction Oxygen Delivery Method: Ambu bag Preoxygenation: Pre-oxygenation with 100% oxygen Induction Type: IV induction Ventilation: Mask ventilation without difficulty Placement Confirmation: positive ETCO2 Dental Injury: Teeth and Oropharynx as per pre-operative assessment

## 2020-12-07 NOTE — Progress Notes (Signed)
Advanced Heart Failure Rounding Note   Subjective:     Remains in AFL. Now rate controlled on IV amio. INR down 8.6 -> 4.9 with po vitamin K  Feels better. Denies SOB or palpitations  Objective:   Weight Range:  Vital Signs:   Temp:  [97.4 F (36.3 C)-99.3 F (37.4 C)] 97.4 F (36.3 C) (04/22 0748) Pulse Rate:  [37-149] 48 (04/22 0748) Resp:  [17-27] 18 (04/22 0748) BP: (94-125)/(46-96) 101/50 (04/22 0748) SpO2:  [95 %-100 %] 97 % (04/22 0805) Weight:  [105.7 kg-106.1 kg] 105.7 kg (04/22 0500) Last BM Date: 12/04/20  Weight change: Filed Weights   12/07/20 0500  Weight: 105.7 kg    Intake/Output:   Intake/Output Summary (Last 24 hours) at 12/07/2020 0931 Last data filed at 12/07/2020 0318 Gross per 24 hour  Intake 359.62 ml  Output --  Net 359.62 ml     Physical Exam: General:  Sitting up in bed. No resp difficulty HEENT: normal Neck: supple.  Carotids 2+ bilat; no bruits. No lymphadenopathy or thryomegaly appreciated. Cor: PMI nondisplaced. Irregular rate & rhythm. No rubs, gallops or murmurs. Lungs: clear but decreased throguhout Abdomen: obese soft, nontender, nondistended. No hepatosplenomegaly. No bruits or masses. Good bowel sounds. Extremities: no cyanosis, clubbing, rash, edema Neuro: alert & orientedx3, cranial nerves grossly intact. moves all 4 extremities w/o difficulty. Affect pleasant  Telemetry: Typical AFL 70s  Personally reviewed   Labs: Basic Metabolic Panel: Recent Labs  Lab 12/06/20 1213 12/06/20 1709 12/07/20 0244  NA 140  --  138  K 3.9  --  3.6  CL 102  --  102  CO2 30  --  27  GLUCOSE 134*  --  86  BUN 65*  --  63*  CREATININE 1.90*  --  1.94*  CALCIUM 8.5*  --  8.1*  MG  --  2.0 1.9    Liver Function Tests: Recent Labs  Lab 12/06/20 1213  AST 26  ALT 41  ALKPHOS 54  BILITOT 0.6  PROT 5.7*  ALBUMIN 3.3*   No results for input(s): LIPASE, AMYLASE in the last 168 hours. No results for input(s): AMMONIA in the  last 168 hours.  CBC: Recent Labs  Lab 12/06/20 1213 12/07/20 0244  WBC 14.8* 16.6*  NEUTROABS 11.6*  --   HGB 12.8 10.9*  HCT 41.8 35.0*  MCV 99.8 97.5  PLT 272 220    Cardiac Enzymes: No results for input(s): CKTOTAL, CKMB, CKMBINDEX, TROPONINI in the last 168 hours.  BNP: BNP (last 3 results) Recent Labs    01/27/20 1150  BNP 450.6*    ProBNP (last 3 results) No results for input(s): PROBNP in the last 8760 hours.    Other results:  Imaging: DG Chest Port 1 View  Result Date: 12/06/2020 CLINICAL DATA:  Dizziness. EXAM: PORTABLE CHEST 1 VIEW COMPARISON:  Single-view of the chest 03/23/2020. FINDINGS: Punctate calcification projecting in the right lower lobe is unchanged. Lungs otherwise clear. Cardiomegaly. No pneumothorax or pleural effusion. Aortic atherosclerosis. No acute or focal bony abnormality. IMPRESSION: No acute disease. Cardiomegaly. Atherosclerosis. Electronically Signed   By: Inge Rise M.D.   On: 12/06/2020 13:30   ECHOCARDIOGRAM COMPLETE  Result Date: 12/05/2020    ECHOCARDIOGRAM REPORT   Patient Name:   Ann Flynn Date of Exam: 12/05/2020 Medical Rec #:  [id number]     Height:       65.0 in Accession #:    [id number]    Weight:  238.0 lb Date of Birth:  July 23, 1941     BSA:          2.130 m Patient Age:    80 years      BP:           176/65 mmHg Patient Gender: F             HR:           70 bpm. Exam Location:  Outpatient Procedure: 2D Echo, 3D Echo, Cardiac Doppler, Color Doppler and Strain Analysis Indications:    I50.40* Unspecified combined systolic (congestive) and diastolic                 (congestive) heart failure  History:        Patient has prior history of Echocardiogram examinations, most                 recent 08/06/2020. CHF, Abnormal ECG, Pulmonary HTN,                 Arrythmias:Atrial Fibrillation, Signs/Symptoms:Syncope; Risk                 Factors:Diabetes. Pulmonary embolus.  Sonographer:    Roseanna Rainbow Referring Phys: [id number]  Kalene Cutler R Dick Hark  Sonographer Comments: Technically difficult study due to poor echo windows and patient is morbidly obese. Image acquisition challenging due to patient body habitus. Global longitudinal strain was attempted. IMPRESSIONS  1. Left ventricular ejection fraction, by estimation, is 55 to 60%. The left ventricle has normal function. The left ventricle has no regional wall motion abnormalities. There is mild left ventricular hypertrophy. Left ventricular diastolic parameters are consistent with Grade II diastolic dysfunction (pseudonormalization).  2. Right ventricular systolic function is normal. The right ventricular size is normal.  3. Left atrial size was mild to moderately dilated.  4. Right atrial size was mildly dilated.  5. The mitral valve is normal in structure. Trivial mitral valve regurgitation. Mild mitral stenosis. Moderate mitral annular calcification.  6. The aortic valve is normal in structure. There is moderate calcification of the aortic valve. Aortic valve regurgitation is not visualized. Mild to moderate aortic valve sclerosis/calcification is present, without any evidence of aortic stenosis.  7. Aortic dilatation noted. There is mild dilatation of the aortic root, measuring 44 mm.  8. The inferior vena cava is normal in size with greater than 50% respiratory variability, suggesting right atrial pressure of 3 mmHg. FINDINGS  Left Ventricle: Left ventricular ejection fraction, by estimation, is 55 to 60%. The left ventricle has normal function. The left ventricle has no regional wall motion abnormalities. The left ventricular internal cavity size was normal in size. There is  mild left ventricular hypertrophy. Left ventricular diastolic parameters are consistent with Grade II diastolic dysfunction (pseudonormalization). Right Ventricle: The right ventricular size is normal. No increase in right ventricular wall thickness. Right ventricular systolic function is normal. Left Atrium:  Left atrial size was mild to moderately dilated. Right Atrium: Right atrial size was mildly dilated. Pericardium: There is no evidence of pericardial effusion. Mitral Valve: The mitral valve is normal in structure. There is moderate thickening of the mitral valve leaflet(s). There is mild calcification of the mitral valve leaflet(s). Moderate mitral annular calcification. Trivial mitral valve regurgitation. Mild mitral valve stenosis. MV peak gradient, 5.3 mmHg. The mean mitral valve gradient is 2.0 mmHg. Tricuspid Valve: The tricuspid valve is normal in structure. Tricuspid valve regurgitation is trivial. No evidence of tricuspid stenosis. Aortic Valve: The aortic valve  is normal in structure. There is moderate calcification of the aortic valve. Aortic valve regurgitation is not visualized. Mild to moderate aortic valve sclerosis/calcification is present, without any evidence of aortic stenosis. Pulmonic Valve: The pulmonic valve was grossly normal. Pulmonic valve regurgitation is mild. No evidence of pulmonic stenosis. Aorta: Aortic dilatation noted. There is mild dilatation of the aortic root, measuring 44 mm. Venous: The inferior vena cava is normal in size with greater than 50% respiratory variability, suggesting right atrial pressure of 3 mmHg. IAS/Shunts: No atrial level shunt detected by color flow Doppler.  LEFT VENTRICLE PLAX 2D LVIDd:         5.05 cm LVIDs:         3.30 cm LV PW:         1.55 cm LV IVS:        1.30 cm LVOT diam:     2.00 cm LV SV:         69 LV SV Index:   32 LVOT Area:     3.14 cm  LV Volumes (MOD) LV vol d, MOD A2C: 88.4 ml LV vol d, MOD A4C: 115.0 ml LV vol s, MOD A2C: 40.8 ml LV vol s, MOD A4C: 50.9 ml LV SV MOD A2C:     47.6 ml LV SV MOD A4C:     115.0 ml LV SV MOD BP:      54.1 ml RIGHT VENTRICLE            IVC RV S prime:     8.38 cm/s  IVC diam: 1.70 cm TAPSE (M-mode): 1.6 cm LEFT ATRIUM             Index       RIGHT ATRIUM           Index LA diam:        4.00 cm 1.88 cm/m  RA  Area:     13.20 cm LA Vol (A2C):   74.1 ml 34.79 ml/m RA Volume:   25.10 ml  11.78 ml/m LA Vol (A4C):   41.8 ml 19.63 ml/m LA Biplane Vol: 56.3 ml 26.43 ml/m  AORTIC VALVE LVOT Vmax:   122.00 cm/s LVOT Vmean:  86.700 cm/s LVOT VTI:    0.219 m  AORTA Ao Root diam: 3.60 cm Ao Asc diam:  4.40 cm MITRAL VALVE MV Area (PHT): 2.58 cm     SHUNTS MV Area VTI:   1.30 cm     Systemic VTI:  0.22 m MV Peak grad:  5.3 mmHg     Systemic Diam: 2.00 cm MV Mean grad:  2.0 mmHg MV Vmax:       1.16 m/s MV Vmean:      63.4 cm/s MV Decel Time: 294 msec MV E velocity: 120.50 cm/s MV A velocity: 112.00 cm/s MV E/A ratio:  1.08 Glori Bickers MD Electronically signed by Glori Bickers MD Signature Date/Time: 12/05/2020/1:10:29 PM    Final       Medications:     Scheduled Medications: . atorvastatin  80 mg Oral QHS  . empagliflozin  10 mg Oral QAC breakfast  . fentaNYL (SUBLIMAZE) injection  50 mcg Intravenous Once  . insulin aspart  0-15 Units Subcutaneous TID WC  . insulin glargine  45 Units Subcutaneous QHS  . linagliptin  5 mg Oral Daily  . losartan  25 mg Oral Daily  . metoprolol succinate  25 mg Oral q AM  . midazolam  2 mg Intravenous Once  . pantoprazole  40 mg Oral Daily  . propofol  0.5 mg/kg Intravenous Once  . tadalafil (PAH)  40 mg Oral QHS  . torsemide  20 mg Oral Daily  . venlafaxine  75 mg Oral Daily  . vitamin B-12  1,000 mcg Oral Daily     Infusions: . amiodarone 30 mg/hr (12/07/20 0328)     PRN Medications:  acetaminophen, alum & mag hydroxide-simeth, docusate sodium, ondansetron (ZOFRAN) IV   Assessment/Plan:   1. Typical A flutter RVR, recurrent - s/p DC/CV 06/2020  - now on amio with good rate control  - EP consulted. Ideally would undergo AFL ablation this admit but if not able will plan DC-CV today (she is NPO) - INR 8.6 -> 4.3 after vitamin K  2. Supra Therapeutic INR - Holding coumadin for now. Resume soon. Discussed dosing with PharmD personally. - Has not  wanted DOAC due to cost  3. Pulmonary HTN (severe) with cor pulmonale -Suspect combination of WHO Group 1 & 3 disease - VQ 07/07/19: No PE - RV functionremainsnormal on echo despite severe PAH - PFTs with significant restrictive lung physiology -PA pressures12/21very high 93/20 (47) butPA pulsativity and cardiac output much higher than would besuspectedin true PAH with pressures of this magnitude.  - cMRI showed no evidence of shunt, Qp/Qs=0.94. -Continue O2 supplementation and CPAP. -Continue tadalafil 40 mg and letairis 5 mg (unable to tolerate higher dose of letairis).Failed [place] - Echo 12/05/20 LVEF 60% RV normal  4. Chronic HFpEF - echo 4/22 EF 60%RV normal  - NYHA III. Volume status ok. Continue torsemide 20 mg daily   5. CKD Stage IV - Creatinine baseline 1.8-2. SCr 1.9 today - Follow BMET daily   6. H/O PE 2009  - continue warfarin - has not wanted DOAC due to cost    Length of Stay: 1   Glori Bickers  MD 12/07/2020, 9:31 AM  Advanced Heart Failure Team Pager [phone] (M-F; [place])  Please contact [facility] for night-coverage after hours (4p -7a ) and weekends on [email or web address]

## 2020-12-08 DIAGNOSIS — I4892 Unspecified atrial flutter: Secondary | ICD-10-CM

## 2020-12-08 DIAGNOSIS — N179 Acute kidney failure, unspecified: Secondary | ICD-10-CM

## 2020-12-08 DIAGNOSIS — I272 Pulmonary hypertension, unspecified: Secondary | ICD-10-CM

## 2020-12-08 LAB — CBC
HCT: 33.2 % — ABNORMAL LOW (ref 36.0–46.0)
Hemoglobin: 10.3 g/dL — ABNORMAL LOW (ref 12.0–15.0)
MCH: 30.4 pg (ref 26.0–34.0)
MCHC: 31 g/dL (ref 30.0–36.0)
MCV: 97.9 fL (ref 80.0–100.0)
Platelets: 178 10*3/uL (ref 150–400)
RBC: 3.39 MIL/uL — ABNORMAL LOW (ref 3.87–5.11)
RDW: 14.9 % (ref 11.5–15.5)
WBC: 10.9 10*3/uL — ABNORMAL HIGH (ref 4.0–10.5)
nRBC: 0 % (ref 0.0–0.2)

## 2020-12-08 LAB — BASIC METABOLIC PANEL
Anion gap: 8 (ref 5–15)
BUN: 60 mg/dL — ABNORMAL HIGH (ref 8–23)
CO2: 27 mmol/L (ref 22–32)
Calcium: 8 mg/dL — ABNORMAL LOW (ref 8.9–10.3)
Chloride: 103 mmol/L (ref 98–111)
Creatinine, Ser: 2.41 mg/dL — ABNORMAL HIGH (ref 0.44–1.00)
GFR, Estimated: 20 mL/min — ABNORMAL LOW (ref >60)
Glucose, Bld: 51 mg/dL — ABNORMAL LOW (ref 70–99)
Potassium: 3.7 mmol/L (ref 3.5–5.1)
Sodium: 138 mmol/L (ref 135–145)

## 2020-12-08 LAB — GLUCOSE, CAPILLARY
Glucose-Capillary: 47 mg/dL — ABNORMAL LOW (ref 70–99)
Glucose-Capillary: 81 mg/dL (ref 70–99)
Glucose-Capillary: 159 mg/dL — ABNORMAL HIGH (ref 70–99)
Glucose-Capillary: 165 mg/dL — ABNORMAL HIGH (ref 70–99)
Glucose-Capillary: 77 mg/dL (ref 70–99)
Glucose-Capillary: 136 mg/dL — ABNORMAL HIGH (ref 70–99)
Glucose-Capillary: 166 mg/dL — ABNORMAL HIGH (ref 70–99)

## 2020-12-08 LAB — PROTIME-INR
INR: 1.8 — ABNORMAL HIGH (ref 0.8–1.2)
Prothrombin Time: 20.8 seconds — ABNORMAL HIGH (ref 11.4–15.2)

## 2020-12-08 LAB — MAGNESIUM: Magnesium: 2.1 mg/dL (ref 1.7–2.4)

## 2020-12-08 MED ORDER — INSULIN GLARGINE 100 UNIT/ML ~~LOC~~ SOLN
30.0000 [IU] | Freq: Every day | SUBCUTANEOUS | Status: DC
  Administered 2020-12-08: 30 [IU] via SUBCUTANEOUS
  Filled 2020-12-08 (×2): qty 0.3

## 2020-12-08 MED ORDER — DEXTROSE 50 % IV SOLN
INTRAVENOUS | Status: AC
  Filled 2020-12-08: qty 50

## 2020-12-08 MED ORDER — WARFARIN SODIUM 2 MG PO TABS: 3.0000 mg | ORAL_TABLET | Freq: Once | ORAL | Status: DC

## 2020-12-08 MED ORDER — WARFARIN - PHARMACIST DOSING INPATIENT: Freq: Every day | Status: DC

## 2020-12-08 MED ORDER — METOPROLOL SUCCINATE ER 25 MG PO TB24: 12.5000 mg | ORAL_TABLET | Freq: Every morning | ORAL | Status: DC

## 2020-12-08 MED ORDER — METOPROLOL SUCCINATE ER 25 MG PO TB24
12.5000 mg | ORAL_TABLET | Freq: Every morning | ORAL | Status: DC
  Administered 2020-12-08 – 2020-12-09 (×2): 12.5 mg via ORAL
  Filled 2020-12-08 (×2): qty 1

## 2020-12-08 MED ORDER — APIXABAN 5 MG PO TABS
5.0000 mg | ORAL_TABLET | Freq: Two times a day (BID) | ORAL | Status: DC
  Administered 2020-12-08 – 2020-12-09 (×2): 5 mg via ORAL
  Filled 2020-12-08 (×3): qty 1

## 2020-12-08 NOTE — Progress Notes (Signed)
Pt stated that she was having trouble catching her breath and feeling faint. Rudi Rummage, MD made aware, 12-lead EKG and vitals obtained. Pt CBG 47 this a.m. hypoglycemic orders initiated. Orange juice and graham crackers given. CBG rechecked: 81. Will continue to monitor.   Elaina Hoops, RN

## 2020-12-08 NOTE — Progress Notes (Signed)
PROGRESS NOTE  Ann Flynn HYH:[id number] DOB: 30-Dec-1940 DOA: 12/06/2020 PCP: Birdie Sons, MD  HPI/Recap of past 24 hours:  Ann Flynn is a 80 y.o. female with medical history significant of PAF on Coumadin, diastolic CHF, moderate pulmonary hypertension, IDDM2, HLD, CKD stage IIIB, OSA on CPAP at bedtime, morbid obesity, came with new onset of palpitations, lightheadedness.  Work-up revealed A. fib/A-flutter with RVR and supratherapeutic INR 8.6.  Patient has not checked her INR in the last month due to scheduling problems.  She had denied any overt bleeding.  She was cardioverted on 12/07/2020 by cardiology.  12/08/20: Patient was seen and examined at her bedside.  She was hypoglycemic overnight.  Serum glucose 51 this morning.  Received 45 units of Lantus last night which is her at home dose.  Hypoglycemia was treated, she feels better.  Assessment/Plan: Active Problems:   Atrial fibrillation, chronic (HCC)   A-fib (HCC)  A flutter/A. fib with RVR Was started on amiodarone drip, switched to p.o. amiodarone 200 mg daily Rate is currently controlled. She was previously on Coumadin for CVA prevention Insurance co-pay was checked for Eliquis, $28 patient wants to switch to [place]. Eliquis started on 12/08/2020. Appreciate cardiology's assistance.  AKI on CKD 3B, likely multifactorial secondary to ARB, hypotension. BPs have been soft with MAP less than 65. Baseline creatinine appears to be 1.6 with GFR of 31. Creatinine is uptrending, 2.41 with GFR of 20. Hold off home losartan and torsemide for now Maintain MAP greater than 65 Monitor urine output Repeat renal panel in the morning  Hypoglycemia in the setting of type 2 diabetes This a.m. fasting serum glucose 51 Last hemoglobin A1c 7.6 on 10/23/2020. She was on Lantus 45 units nightly which is her at home dose. Reduce Lantus dose down to 30 units nightly Closely monitor serum glucose and CBGs. Avoid  hypoglycemia  Essential hypertension, BPs have been soft. Hold off losartan and torsemide for now Closely monitor vital signs Maintain MAP greater than 65 as able.  Resolved supratherapeutic INR Presented with INR greater than 8.6 INR 1.8 on 12/08/2020. Patient wants to switch to DOAC.  Resolved leukocytosis, likely reactive Chest x-ray nonacute. No evidence of active infective process. Nontoxic-appearing Afebrile  Improved generalized weakness Assessed by PT OT with no further recommendations. Continue fall precautions Continue to encourage oral intake.  Pulmonary artery hypertension Management per cardiology Likely will need to resume home torsemide.  Type 2 diabetes Continue to hold off home oral hypoglycemics Continue Lantus, decrease dose to 30 units nightly Continue insulin sliding scale.  Obesity BMI 38 Recommend weight loss outpatient with regular physical activity and healthy dieting.    Code Status: Full code.  Family Communication: None at bedside.  Disposition Plan: Likely will discharge to previous setting on 12/09/2020 or when cardiology signs off.   Consultants:  Cardiology.  Procedures:  None.  Antimicrobials:  None  DVT prophylaxis: Eliquis.  Status is: Inpatient   Dispo:  Patient From: Home  Planned Disposition: Home, possibly tomorrow 12/09/2020.  Medically stable for discharge: No          Objective: Vitals:   12/08/20 0051 12/08/20 0737 12/08/20 0808 12/08/20 1137  BP: (!) 99/37 105/83 (!) 114/41 (!) 111/48  Pulse: (!) 54 82 76 69  Resp: _0 Temp: 98.4 F (36.9 C) 97.9 F (36.6 C)  98.1 F (36.7 C)  TempSrc: Oral Oral  Oral  SpO2: 98% 100% 99% 100%  Weight:  Height:        Intake/Output Summary (Last 24 hours) at 12/08/2020 1226 Last data filed at 12/08/2020 0700 Gross per 24 hour  Intake 729.79 ml  Output 800 ml  Net -70.21 ml   Filed Weights   12/07/20 0500  Weight: 105.7 kg     Exam:  . General: 80 y.o. year-old female pleasant, well-developed well-nourished in no acute distress.  She is alert and oriented x3.   . Cardiovascular: Irregular rate and rhythm no rubs or gallops.   [name] Respiratory: Clear to auscultation no wheezes or rales.   . Abdomen: Soft nontender normal bowel sounds present.   . Musculoskeletal: No lower extremity edema bilaterally. . Skin: No ulcerative lesions noted. [name] Psychiatry: Mood is appropriate for condition and setting.   Data Reviewed: CBC: Recent Labs  Lab 12/06/20 1213 12/07/20 0244 12/08/20 0319  WBC 14.8* 16.6* 10.9*  NEUTROABS 11.6*  --   --   HGB 12.8 10.9* 10.3*  HCT 41.8 35.0* 33.2*  MCV 99.8 97.5 97.9  PLT 272 220 161   Basic Metabolic Panel: Recent Labs  Lab 12/06/20 1213 12/06/20 1709 12/07/20 0244 12/08/20 0319  NA 140  --  138 138  K 3.9  --  3.6 3.7  CL 102  --  102 103  CO2 30  --  27 27  GLUCOSE 134*  --  86 51*  BUN 65*  --  63* 60*  CREATININE 1.90*  --  1.94* 2.41*  CALCIUM 8.5*  --  8.1* 8.0*  MG  --  2.0 1.9 2.1   GFR: Estimated Creatinine Clearance: 22.9 mL/min (A) (by C-G formula based on SCr of 2.41 mg/dL (H)). Liver Function Tests: Recent Labs  Lab 12/06/20 1213  AST 26  ALT 41  ALKPHOS 54  BILITOT 0.6  PROT 5.7*  ALBUMIN 3.3*   No results for input(s): LIPASE, AMYLASE in the last 168 hours. No results for input(s): AMMONIA in the last 168 hours. Coagulation Profile: Recent Labs  Lab 12/06/20 1213 12/07/20 0244 12/08/20 0319  INR 8.6* 4.9* 1.8*   Cardiac Enzymes: No results for input(s): CKTOTAL, CKMB, CKMBINDEX, TROPONINI in the last 168 hours. BNP (last 3 results) No results for input(s): PROBNP in the last 8760 hours. HbA1C: No results for input(s): HGBA1C in the last 72 hours. CBG: Recent Labs  Lab 12/07/20 1650 12/08/20 0429 12/08/20 0503 12/08/20 0740 12/08/20 1138  GLUCAP 83 47* 81 159* 165*   Lipid Profile: No results for input(s): CHOL, HDL,  LDLCALC, TRIG, CHOLHDL, LDLDIRECT in the last 72 hours. Thyroid Function Tests: Recent Labs    12/06/20 1709  TSH 1.283   Anemia Panel: No results for input(s): VITAMINB12, FOLATE, FERRITIN, TIBC, IRON, RETICCTPCT in the last 72 hours. Urine analysis:    Component Value Date/Time   COLORURINE YELLOW 08/05/2020 1718   APPEARANCEUR CLEAR 08/05/2020 1718   LABSPEC 1.008 08/05/2020 1718   PHURINE 5.0 08/05/2020 1718   GLUCOSEU 150 (A) 08/05/2020 1718   HGBUR SMALL (A) 08/05/2020 1718   BILIRUBINUR NEGATIVE 08/05/2020 1718   KETONESUR NEGATIVE 08/05/2020 1718   PROTEINUR NEGATIVE 08/05/2020 1718   NITRITE POSITIVE (A) 08/05/2020 1718   LEUKOCYTESUR TRACE (A) 08/05/2020 1718   Sepsis Labs: _0 (procalcitonin:4,lacticidven:4)  ) Recent Results (from the past 240 hour(s))  SARS CORONAVIRUS 2 (TAT 6-24 HRS) Nasopharyngeal Nasopharyngeal Swab     Status: None   Collection Time: 12/06/20  6:33 PM   Specimen: Nasopharyngeal Swab  Result Value Ref Range Status  SARS Coronavirus 2 NEGATIVE NEGATIVE Final    Comment: (NOTE) SARS-CoV-2 target nucleic acids are NOT DETECTED.  The SARS-CoV-2 RNA is generally detectable in upper and lower respiratory specimens during the acute phase of infection. Negative results do not preclude SARS-CoV-2 infection, do not rule out co-infections with other pathogens, and should not be used as the sole basis for treatment or other patient management decisions. Negative results must be combined with clinical observations, patient history, and epidemiological information. The expected result is Negative.  Fact Sheet for Patients: SugarRoll.be  Fact Sheet for Healthcare Providers: https://www.woods-mathews.com/  This test is not yet approved or cleared by the [place] FDA and  has been authorized for detection and/or diagnosis of SARS-CoV-2 by FDA under an Emergency Use Authorization (EUA). This EUA  will remain  in effect (meaning this test can be used) for the duration of the COVID-19 declaration under Se ction 564(b)(1) of the Act, 21 U.S.C. section 360bbb-3(b)(1), unless the authorization is terminated or revoked sooner.  Performed at [facility], [place] [address], [place], [place] [address]       Studies: No results found.  Scheduled Meds: . amiodarone  200 mg Oral Daily  . atorvastatin  80 mg Oral QHS  . empagliflozin  10 mg Oral QAC breakfast  . insulin aspart  0-15 Units Subcutaneous TID WC  . insulin glargine  30 Units Subcutaneous QHS  . linagliptin  5 mg Oral Daily  . metoprolol succinate  12.5 mg Oral q AM  . pantoprazole  40 mg Oral Daily  . tadalafil (PAH)  40 mg Oral QHS  . venlafaxine  75 mg Oral Daily  . vitamin B-12  1,000 mcg Oral Daily  . warfarin  3 mg Oral ONCE-1600  . Warfarin - Pharmacist Dosing Inpatient   Does not apply q1600    Continuous Infusions:    LOS: 2 days     Kayleen Memos, MD [facility] Pager [phone]  If 7PM-7AM, please contact night-coverage [email or web address] Password [facility] 12/08/2020, 12:26 PM

## 2020-12-08 NOTE — Progress Notes (Signed)
Progress Note  Patient Name: Ann Flynn Date of Encounter: 12/08/2020  [facility] Cardiologist: Glori Bickers, MD   Subjective   INR down to 1.8 today.  Underwent successful cardioversion yesterday.  She denies any chest pain or shortness of breath.  Inpatient Medications    Scheduled Meds: . amiodarone  200 mg Oral Daily  . atorvastatin  80 mg Oral QHS  . empagliflozin  10 mg Oral QAC breakfast  . insulin aspart  0-15 Units Subcutaneous TID WC  . insulin glargine  30 Units Subcutaneous QHS  . linagliptin  5 mg Oral Daily  . metoprolol succinate  12.5 mg Oral q AM  . pantoprazole  40 mg Oral Daily  . tadalafil (PAH)  40 mg Oral QHS  . venlafaxine  75 mg Oral Daily  . vitamin B-12  1,000 mcg Oral Daily  . warfarin  3 mg Oral ONCE-1600  . Warfarin - Pharmacist Dosing Inpatient   Does not apply q1600   Continuous Infusions:  PRN Meds: acetaminophen, alum & mag hydroxide-simeth, docusate sodium, ondansetron (ZOFRAN) IV   Vital Signs    Vitals:   12/08/20 0051 12/08/20 0737 12/08/20 0808 12/08/20 1137  BP: (!) 99/37 105/83 (!) 114/41 (!) 111/48  Pulse: (!) 54 82 76 69  Resp: _0 Temp: 98.4 F (36.9 C) 97.9 F (36.6 C)  98.1 F (36.7 C)  TempSrc: Oral Oral  Oral  SpO2: 98% 100% 99% 100%  Weight:      Height:        Intake/Output Summary (Last 24 hours) at 12/08/2020 1154 Last data filed at 12/08/2020 0700 Gross per 24 hour  Intake 729.79 ml  Output 800 ml  Net -70.21 ml   Last 3 Weights 12/07/2020 12/06/2020 12/05/2020  Weight (lbs) 233 lb 1.6 oz 234 lb 235 lb  Weight (kg) 105.733 kg 106.142 kg 106.595 kg      Telemetry    Normal sinus rhythm with PACs, rate 60s to 80s- Personally Reviewed  ECG    Normal sinus rhythm with PACs/PVCs- Personally Reviewed  Physical Exam   GEN: No acute distress.   Neck: No JVD Cardiac: RRR, no murmurs, rubs, or gallops.  Respiratory: Clear to auscultation bilaterally. GI: Soft, nontender,  non-distended  MS: No edema; No deformity. Neuro:  Nonfocal  Psych: Normal affect   Labs    High Sensitivity Troponin:  No results for input(s): TROPONINIHS in the last 720 hours.    Chemistry Recent Labs  Lab 12/06/20 1213 12/07/20 0244 12/08/20 0319  NA 140 138 138  K 3.9 3.6 3.7  CL 102 102 103  CO2 _1 GLUCOSE 134* 86 51*  BUN 65* 63* 60*  CREATININE 1.90* 1.94* 2.41*  CALCIUM 8.5* 8.1* 8.0*  PROT 5.7*  --   --   ALBUMIN 3.3*  --   --   AST 26  --   --   ALT 41  --   --   ALKPHOS 54  --   --   BILITOT 0.6  --   --   GFRNONAA 27* 26* 20*  ANIONGAP _2 Hematology Recent Labs  Lab 12/06/20 1213 12/07/20 0244 12/08/20 0319  WBC 14.8* 16.6* 10.9*  RBC 4.19 3.59* 3.39*  HGB 12.8 10.9* 10.3*  HCT 41.8 35.0* 33.2*  MCV 99.8 97.5 97.9  MCH 30.5 30.4 30.4  MCHC 30.6 31.1 31.0  RDW 14.7 14.6 14.9  PLT 272 220 178  BNPNo results for input(s): BNP, PROBNP in the last 168 hours.   DDimer No results for input(s): DDIMER in the last 168 hours.   Radiology    DG Chest Port 1 View  Result Date: 12/06/2020 CLINICAL DATA:  Dizziness. EXAM: PORTABLE CHEST 1 VIEW COMPARISON:  Single-view of the chest 03/23/2020. FINDINGS: Punctate calcification projecting in the right lower lobe is unchanged. Lungs otherwise clear. Cardiomegaly. No pneumothorax or pleural effusion. Aortic atherosclerosis. No acute or focal bony abnormality. IMPRESSION: No acute disease. Cardiomegaly. Atherosclerosis. Electronically Signed   By: Inge Rise M.D.   On: 12/06/2020 13:30    Cardiac Studies     Patient Profile     80 y.o. female h/omorbid obesity, DM, OSA on CPAP, CKD 4,PAD, CVA, carotid surgery in 2010,pulmonary embolism (2009) and [place]  who we are consulted for atrial flutter  Assessment & Plan    1. A flutter RVR -Had DCCV 06/2020.  Presented with atrial flutter with RVR and supratherapeutic INR -Started amio drip and underwent cardioversion 4/22.  Switch to  p.o. amio, would continue on discharge -EP consulted, seen by Dr. Curt Bears, recommend amiodarone and outpatient follow-up for consideration of flutter ablation. -Has been on warfarin but reports had missed recent visits to monitor her INR and presented with supratherapeutic INR.  Recommend switching to DOAC.  She previously has not been on DOAC due to cost.  Per pharmacy her co-pay for Eliquis would be $28, she reports she is able to afford this.  Will start Eliquis  2. Supra Therapeutic INR Holding coumadin as noted above, now subtherapeutic.  Switching to Eliquis as above  3. Pulmonary HTN (severe) with cor pulmonale -Suspect combination of WHO Group 1 & 3 disease - VQ 07/07/19: No PE - RV functionremainsnormal on echo despite severe PAH - PFTs with significant restrictive lung physiology -PA pressures12/21very high butPA pulsativity and cardiac output much higher than would besuspectedin true PAH with pressures of this magnitude.  - cMRI showed no evidence of shunt, Qp/Qs=0.94. -Continue O2 supplementation and CPAP. -Continue tadalafil 40 mg and letairis 5 mg (unable to tolerate higher dose of letairis). - Echo today 12/05/20 LVEF 60% RV normal - Unable to tolerate selexipag currently. Can consider rechallenge in the future.  4. Chronic HFpEF EF 60%RV normal  NYHA III. Volume status ok. Continue torsemide 20 mg daily   5.  AKI on CKD Stage IV Creatinine baseline 1.8-2  Creatinine up to 2.4 today.  We will hold losartan and torsemide  For questions or updates, please contact [place] Please consult [email or web address] for contact info under        Signed, Donato Heinz, MD  12/08/2020, 11:54 AM

## 2020-12-08 NOTE — Progress Notes (Addendum)
Physical Therapy Treatment Patient Details Name: Ann Flynn MRN: [id number] DOB: 04-Jun-1941 Today's Date: 12/08/2020    History of Present Illness Pt is a 80 y.o. female admitted 12/06/20 with SOB, lightheadedness, palpitations; found to be in rapid atrial flutter. S/p cardioversion 4/22. PMH includes CKD IV, pulmonary HTN, PAF on Coumadin, CHF, IDDM, CKD 3, OSA on CPAP, obesity.   PT Comments    Pt progressing well with mobility; noted improvements in SOB and lightheadedness compared to yesterday's session prior to cardioversion; still with notable drop in BP (see values below; RN notified), pt endorses very minimal dizziness with standing, improved by end of session. Pt moving well with rollator and supervision for safety. Pt hopeful for d/c home tomorrow; will have necessary assist from daughter if needed.  Orthostatic BPs Supine 124/49, HR 68  Sitting 107/45, HR 78  Standing 68/45, HR 90  Standing after 2 min 99/49  Post-ambulation 127/80      Follow Up Recommendations  No PT follow up;Supervision - Intermittent     Equipment Recommendations  None recommended by PT    Recommendations for Other Services       Precautions / Restrictions Precautions Precautions: Fall;Other (comment) Precaution Comments: Watch BP (orthostatic with standing, pt with very minimal symptoms and BP increased with activity); wears 3L O2 baseline Restrictions Weight Bearing Restrictions: No    Mobility  Bed Mobility Overal bed mobility: Modified Independent                  Transfers Overall transfer level: Modified independent Equipment used: 4-wheeled walker Transfers: Sit to/from Stand              Ambulation/Gait Ambulation/Gait assistance: Supervision Gait Distance (Feet): 56 Feet Assistive device: 4-wheeled walker Gait Pattern/deviations: Step-through pattern;Decreased stride length Gait velocity: Decreased   General Gait Details: Slow, steady gait with rollator and  supervision for safety; pt endorses BLE fatigued, minimal SOB noted, denies dizziness   Stairs             Wheelchair Mobility    Modified Rankin (Stroke Patients Only)       Balance Overall balance assessment: Needs assistance   Sitting balance-Leahy Scale: Good       Standing balance-Leahy Scale: Fair Standing balance comment: Can static stand without UE support, stability improved with UE support on rollator                            Cognition Arousal/Alertness: Awake/alert Behavior During Therapy: WFL for tasks assessed/performed Overall Cognitive Status: Within Functional Limits for tasks assessed                                        Exercises      General Comments General comments (skin integrity, edema, etc.): Pt endorses very minimal dizziness with standing this session. Supine BP 124/49, HR 68; standing BP 68/45, HR 90; post-ambulation BP 127/80. Educ on signs/symptoms of orthostatic hypotension and safety/strategies for fall risk reduction, overcoming symptoms (I.e. sitting, arm pumps)      Pertinent Vitals/Pain Pain Assessment: No/denies pain    Home Living                      Prior Function            PT Goals (current goals can now be found in the  care plan section) Progress towards PT goals: Progressing toward goals    Frequency    Min 3X/week      PT Plan Current plan remains appropriate    Co-evaluation              AM-PAC PT "6 Clicks" Mobility   Outcome Measure  Help needed turning from your back to your side while in a flat bed without using bedrails?: None Help needed moving from lying on your back to sitting on the side of a flat bed without using bedrails?: None Help needed moving to and from a bed to a chair (including a wheelchair)?: None Help needed standing up from a chair using your arms (e.g., wheelchair or bedside chair)?: None Help needed to walk in hospital room?: A  Little Help needed climbing 3-5 steps with a railing? : A Little 6 Click Score: 22    End of Session   Activity Tolerance: Patient tolerated treatment well Patient left: in chair;with call bell/phone within reach Nurse Communication: Mobility status PT Visit Diagnosis: Other abnormalities of gait and mobility (R26.89);Muscle weakness (generalized) (M62.81)     Time: 0223-3612 PT Time Calculation (min) (ACUTE ONLY): 26 min  Charges:  $Therapeutic Activity: 23-37 mins                    Mabeline Caras, PT, DPT Acute Rehabilitation Services  Pager [phone] Office [phone]  Derry Lory 12/08/2020, 12:09 PM

## 2020-12-08 NOTE — Progress Notes (Addendum)
Jessie for apixaban Indication: atrial fibrillation  Allergies  Allergen Reactions  . Penicillins Swelling    Facial swelling Did it involve swelling of the face/tongue/throat, SOB, or low BP? Yes Did it involve sudden or severe rash/hives, skin peeling, or any reaction on the inside of your mouth or nose? No Did you need to seek medical attention at a hospital or doctor's office? No When did it last happen?10 + years If all above answers are "NO", may proceed with cephalosporin use.   . Sulfa Antibiotics Rash  . Malvin Johns [Selexipag] Other (See Comments)    headaches    Patient Measurements: Height: _0  (165.1 cm) Weight: 105.7 kg (233 lb 1.6 oz) IBW/kg (Calculated) : 57  Vital Signs: Temp: 98.4 F (36.9 C) (04/23 0051) Temp Source: Oral (04/23 0051) BP: 99/37 (04/23 0051) Pulse Rate: 54 (04/23 0051)  Labs: Recent Labs    12/06/20 1213 12/07/20 0244 12/08/20 0319  HGB 12.8 10.9* 10.3*  HCT 41.8 35.0* 33.2*  PLT 272 220 178  LABPROT 70.7* 45.9* 20.8*  INR 8.6* 4.9* 1.8*  CREATININE 1.90* 1.94* 2.41*    Estimated Creatinine Clearance: 22.9 mL/min (A) (by C-G formula based on SCr of 2.41 mg/dL (H)).   Medical History: Past Medical History:  Diagnosis Date  . A-fib ([place])   . Anemia   . CHF (congestive heart failure) ([place])   . Chronic kidney disease   . Diabetes mellitus without complication ([place])   . Hypertension   . Pulmonary hypertension ([place])   . Syncope 07/2020   Assessment: 80 year old female on warfarin PTA for afib. Pharmacy has been consulted to dose warfarin. Currently holding warfarin per Cardiology due to significantly elevated INR. She has received one dose of oral vit k at this time. In the past cost of DOACs have been prohibitive but Eliquis copay is $28.30 and Xarelto copay is $27.64  -INR 8.6>4.9 after oral vitamin K 2.5 x1 on 4/21. Today INR is 1.8. CBC stable. S/p successful cardioversion to  NSR yesterday. Transitioned from amiodarone infusion to maintenance oral. Given amiodarone drug-drug interaction and supratherapeutic INR on admission on home regimen, will resume warfarin cautiously.  PTA Warfarin:  Last know dose 4 mg daily. Last INR 2.7 in January 2022 on 26 mg weekly dose (4 mg daily except 3 mg Wed/Sat)   Goal of Therapy:  INR 2-3 Monitor platelets by anticoagulation protocol: Yes   Plan:  Warfarin 3 mg tonight - Monitor daily INR - Could consider changing to a [place], PharmD PGY-1 Pharmacy Resident 12/08/2020 7:39 AM  ADDENDUM: Pharmacy consulted to start apixaban instead of warfarin for afib. Per Cardiology, patient reports able to afford coapy for [place].  Patient in AKI w/ history of CKD. Baseline ~1.8-2. Scr > 1.5 and age 67, weight > 60 kg; dose reduction not needed but consider once age > 20.   Start apixaban 5 mg BID Discontinue warfarin (did not receive dose) Monitor CBC, s/sx bleeding  Fara Olden, PharmD PGY-1 Pharmacy Resident 12/08/2020 12:43 PM Please see AMION for all pharmacy numbers

## 2020-12-08 NOTE — Progress Notes (Signed)
Pt stated she doesn't want to wear CPAP for the night.

## 2020-12-09 DIAGNOSIS — I5032 Chronic diastolic (congestive) heart failure: Secondary | ICD-10-CM

## 2020-12-09 LAB — CBC
HCT: 30.2 % — ABNORMAL LOW (ref 36.0–46.0)
Hemoglobin: 9.5 g/dL — ABNORMAL LOW (ref 12.0–15.0)
MCH: 30.9 pg (ref 26.0–34.0)
MCHC: 31.5 g/dL (ref 30.0–36.0)
MCV: 98.4 fL (ref 80.0–100.0)
Platelets: 164 10*3/uL (ref 150–400)
RBC: 3.07 MIL/uL — ABNORMAL LOW (ref 3.87–5.11)
RDW: 14.7 % (ref 11.5–15.5)
WBC: 9.6 10*3/uL (ref 4.0–10.5)
nRBC: 0 % (ref 0.0–0.2)

## 2020-12-09 LAB — BASIC METABOLIC PANEL
Anion gap: 5 (ref 5–15)
BUN: 57 mg/dL — ABNORMAL HIGH (ref 8–23)
CO2: 28 mmol/L (ref 22–32)
Calcium: 8 mg/dL — ABNORMAL LOW (ref 8.9–10.3)
Chloride: 104 mmol/L (ref 98–111)
Creatinine, Ser: 2.52 mg/dL — ABNORMAL HIGH (ref 0.44–1.00)
GFR, Estimated: 19 mL/min — ABNORMAL LOW (ref >60)
Glucose, Bld: 146 mg/dL — ABNORMAL HIGH (ref 70–99)
Potassium: 4.1 mmol/L (ref 3.5–5.1)
Sodium: 137 mmol/L (ref 135–145)

## 2020-12-09 LAB — GLUCOSE, CAPILLARY
Glucose-Capillary: 128 mg/dL — ABNORMAL HIGH (ref 70–99)
Glucose-Capillary: 160 mg/dL — ABNORMAL HIGH (ref 70–99)

## 2020-12-09 MED ORDER — APIXABAN 5 MG PO TABS: 5.0000 mg | ORAL_TABLET | Freq: Two times a day (BID) | ORAL | 0 refills | Status: DC

## 2020-12-09 MED ORDER — TORSEMIDE 20 MG PO TABS: 20.0000 mg | ORAL_TABLET | Freq: Every day | ORAL | Status: DC

## 2020-12-09 MED ORDER — METOPROLOL SUCCINATE ER 25 MG PO TB24: 12.5000 mg | ORAL_TABLET | Freq: Every morning | ORAL | 0 refills | Status: DC

## 2020-12-09 MED ORDER — TORSEMIDE 20 MG PO TABS: 20.0000 mg | ORAL_TABLET | Freq: Every day | ORAL | 2 refills | Status: DC | PRN

## 2020-12-09 MED ORDER — AMIODARONE HCL 200 MG PO TABS: 200.0000 mg | ORAL_TABLET | Freq: Every day | ORAL | 0 refills | Status: DC

## 2020-12-09 MED ORDER — INSULIN GLARGINE (1 UNIT DIAL) 300 UNIT/ML ~~LOC~~ SOPN: 30.0000 [IU] | PEN_INJECTOR | Freq: Every day | SUBCUTANEOUS | 0 refills | Status: DC

## 2020-12-09 MED ORDER — TORSEMIDE 20 MG PO TABS: 20.0000 mg | ORAL_TABLET | Freq: Every day | ORAL | 0 refills | Status: DC | PRN

## 2020-12-09 NOTE — Discharge Instructions (Signed)
Atrial Flutter  Atrial flutter is a type of abnormal heart rhythm (arrhythmia). The heart has an electrical system that tells it how to beat. In atrial flutter, the signals move rapidly in the top chambers of the heart (the atria). This makes your heart beat very fast. Atrial flutter can come and go, or it can be permanent. The goal of treatment is to prevent blood clots from forming, control your heart rate, or restore your heartbeat to a normal rhythm. If this condition is not treated, it can cause serious problems, such as a weakened heart muscle (cardiomyopathy) or a stroke. What are the causes? This condition is often caused by conditions that damage the heart's electrical system. These include:  Heart conditions and heart surgery. These include heart attacks and open-heart surgery.  Lung problems, such as COPD or a blood clot in the lung (pulmonary embolism, or PE).  Poorly controlled high blood pressure (hypertension).  Overactive thyroid (hyperthyroidism).  Diabetes. In some cases, the cause of this condition is not known. What increases the risk? You are more likely to develop this condition if:  You are an elderly adult.  You are a man.  You are overweight (obese).  You have obstructive sleep apnea.  You have a family history of atrial flutter.  You have diabetes.  You drink a lot of alcohol, especially binge drinking.  You use drugs, including cannabis.  You smoke. What are the signs or symptoms? Symptoms of this condition include:  A feeling that your heart is pounding or racing (palpitations).  Shortness of breath.  Chest pain.  Feeling dizzy or light-headed.  Fainting.  Low blood pressure (hypotension).  Fatigue.  Tiring easily during exercise or activity. In some cases, there are no symptoms. How is this diagnosed? This condition may be diagnosed with:  An electrocardiogram (ECG) to check electrical signals of the heart.  An ambulatory  cardiac monitor to record your heart's activity for a few days.  An echocardiogram to create pictures of your heart.  A transesophageal echocardiogram (TEE) to create even better pictures of your heart.  A stress test to check your blood supply while you exercise.  Imaging tests, such as a CT scan or chest X-ray.  Blood tests. How is this treated? Treatment depends on underlying conditions and how you feel when you experience atrial flutter. This condition may be treated with:  Medicines to prevent blood clots or to treat heart rate or heart rhythm problems.  Electrical cardioversion to reset the heart's rhythm.  Ablation to remove the heart tissue that sends abnormal signals.  Left atrial appendage closure to seal the area where blood clots can form. In some cases, underlying conditions will be treated. Follow these instructions at home: Medicines  Take over-the-counter and prescription medicines only as told by your health care provider.  Do not take any new medicines without talking to your health care provider.  If you are taking blood thinners: ? Talk with your health care provider before you take any medicines that contain aspirin or NSAIDs, such as ibuprofen. These medicines increase your risk for dangerous bleeding. ? Take your medicine exactly as told, at the same time every day. ? Avoid activities that could cause injury or bruising, and follow instructions about how to prevent falls. ? Wear a medical alert bracelet or carry a card that lists what medicines you take. Lifestyle  Eat heart-healthy foods. Talk with a dietitian to make an eating plan that is right for you.  Do  not use any products that contain nicotine or tobacco, such as cigarettes, e-cigarettes, and chewing tobacco. If you need help quitting, ask your health care provider.  Do not drink alcohol.  Do not use drugs, including cannabis.  Lose weight if you are overweight or obese.  Exercise  regularly as instructed by your health care provider. General instructions  Do not use diet pills unless your health care provider approves. Diet pills may make heart problems worse.  If you have obstructive sleep apnea, manage your condition as told by your health care provider.  Keep all follow-up visits as told by your health care provider. This is important. Contact a health care provider if you:  Notice a change in the rate, rhythm, or strength of your heartbeat.  Are taking a blood thinner and you notice more bruising.  Have a sudden change in weight.  Tire more easily when you exercise or do heavy work. Get help right away if you have:  Pain or pressure in your chest.  Shortness of breath.  Fainting.  Increasing sweating with no known cause.  Side effects of blood thinners, such as blood in your vomit, stool, or urine, or bleeding that cannot stop.  Any symptoms of a stroke. "BE FAST" is an easy way to remember the main warning signs of a stroke: ? B - Balance. Signs are dizziness, sudden trouble walking, or loss of balance. ? E - Eyes. Signs are trouble seeing or a sudden change in vision. ? F - Face. Signs are sudden weakness or numbness of the face, or the face or eyelid drooping on one side. ? A - Arms. Signs are weakness or numbness in an arm. This happens suddenly and usually on one side of the body. ? S - Speech. Signs are sudden trouble speaking, slurred speech, or trouble understanding what people say. ? T - Time. Time to call emergency services. Write down what time symptoms started.  Other signs of a stroke, such as: ? A sudden, severe headache with no known cause. ? Nausea or vomiting. ? Seizure.  These symptoms may represent a serious problem that is an emergency. Do not wait to see if the symptoms will go away. Get medical help right away. Call your local emergency services (911 in the [place] Do not drive yourself to the hospital. Summary  Atrial  flutter is an abnormal heart rhythm that can give you symptoms of palpitations, shortness of breath, or fatigue.  Atrial flutter is often treated with medicines to keep your heart in a normal rhythm and to prevent a stroke.  Get help right away if you cannot catch your breath, or have chest pain or pressure.  Get help right away if you have signs or symptoms of a stroke. This information is not intended to replace advice given to you by your health care provider. Make sure you discuss any questions you have with your health care provider. Document Revised: 01/26/2019 Document Reviewed: 01/26/2019 Elsevier Patient Education  2021 [place].   Atrial Fibrillation  Atrial fibrillation is a type of heartbeat that is irregular or fast. If you have this condition, your heart beats without any order. This makes it hard for your heart to pump blood in a normal way. Atrial fibrillation may come and go, or it may become a long-lasting problem. If this condition is not treated, it can put you at higher risk for stroke, heart failure, and other heart problems. What are the causes? This condition may be  caused by diseases that damage the heart. They include:  High blood pressure.  Heart failure.  Heart valve disease.  Heart surgery. Other causes include:  Diabetes.  Thyroid disease.  Being overweight.  Kidney disease. Sometimes the cause is not known. What increases the risk? You are more likely to develop this condition if:  You are older.  You smoke.  You exercise often and very hard.  You have a family history of this condition.  You are a man.  You use drugs.  You drink a lot of alcohol.  You have lung conditions, such as emphysema, pneumonia, or COPD.  You have sleep apnea. What are the signs or symptoms? Common symptoms of this condition include:  A feeling that your heart is beating very fast.  Chest pain or discomfort.  Feeling short of  breath.  Suddenly feeling light-headed or weak.  Getting tired easily during activity.  Fainting.  Sweating. In some cases, there are no symptoms. How is this treated? Treatment for this condition depends on underlying conditions and how you feel when you have atrial fibrillation. They include:  Medicines to: ? Prevent blood clots. ? Treat heart rate or heart rhythm problems.  Using devices, such as a pacemaker, to correct heart rhythm problems.  Doing surgery to remove the part of the heart that sends bad signals.  Closing an area where clots can form in the heart (left atrial appendage). In some cases, your doctor will treat other underlying conditions. Follow these instructions at home: Medicines  Take over-the-counter and prescription medicines only as told by your doctor.  Do not take any new medicines without first talking to your doctor.  If you are taking blood thinners: ? Talk with your doctor before you take any medicines that have aspirin or NSAIDs, such as ibuprofen, in them. ? Take your medicine exactly as told by your doctor. Take it at the same time each day. ? Avoid activities that could hurt or bruise you. Follow instructions about how to prevent falls. ? Wear a bracelet that says you are taking blood thinners. Or, carry a card that lists what medicines you take. Lifestyle  Do not use any products that have nicotine or tobacco in them. These include cigarettes, e-cigarettes, and chewing tobacco. If you need help quitting, ask your doctor.  Eat heart-healthy foods. Talk with your doctor about the right eating plan for you.  Exercise regularly as told by your doctor.  Do not drink alcohol.  Lose weight if you are overweight.  Do not use drugs, including cannabis.      General instructions  If you have a condition that causes breathing to stop for a short period of time (apnea), treat it as told by your doctor.  Keep a healthy weight. Do not use  diet pills unless your doctor says they are safe for you. Diet pills may make heart problems worse.  Keep all follow-up visits as told by your doctor. This is important. Contact a doctor if:  You notice a change in the speed, rhythm, or strength of your heartbeat.  You are taking a blood-thinning medicine and you get more bruising.  You get tired more easily when you move or exercise.  You have a sudden change in weight. Get help right away if:  You have pain in your chest or your belly (abdomen).  You have trouble breathing.  You have side effects of blood thinners, such as blood in your vomit, poop (stool), or pee (urine),  or bleeding that cannot stop.  You have any signs of a stroke. "BE FAST" is an easy way to remember the main warning signs: ? B - Balance. Signs are dizziness, sudden trouble walking, or loss of balance. ? E - Eyes. Signs are trouble seeing or a change in how you see. ? F - Face. Signs are sudden weakness or loss of feeling in the face, or the face or eyelid drooping on one side. ? A - Arms. Signs are weakness or loss of feeling in an arm. This happens suddenly and usually on one side of the body. ? S - Speech. Signs are sudden trouble speaking, slurred speech, or trouble understanding what people say. ? T - Time. Time to call emergency services. Write down what time symptoms started.  You have other signs of a stroke, such as: ? A sudden, very bad headache with no known cause. ? Feeling like you may vomit (nausea). ? Vomiting. ? A seizure. These symptoms may be an emergency. Do not wait to see if the symptoms will go away. Get medical help right away. Call your local emergency services (911 in the [place] Do not drive yourself to the hospital. _  Summary  Atrial fibrillation is a type of heartbeat that is irregular or fast.  You are at higher risk of this condition if you smoke, are older, have diabetes, or are overweight.  Follow your doctor's  instructions about medicines, diet, exercise, and follow-up visits.  Get help right away if you have signs or symptoms of a stroke.  Get help right away if you cannot catch your breath, or you have chest pain or discomfort. This information is not intended to replace advice given to you by your health care provider. Make sure you discuss any questions you have with your health care provider. Document Revised: 01/26/2019 Document Reviewed: 01/26/2019 Elsevier Patient Education  2021 [place].  Chronic Kidney Disease, Adult Chronic kidney disease is when lasting damage happens to the kidneys slowly over a long time. The kidneys help to:  Make pee (urine).  Make hormones.  Keep the right amount of fluids and chemicals in the body. Most often, this disease does not go away. You must take steps to help keep the kidney damage from getting worse. If steps are not taken, the kidneys might stop working forever. What are the causes?  Diabetes.  High blood pressure.  Diseases that affect the heart and blood vessels.  Other kidney diseases.  Diseases of the body's disease-fighting system.  A problem with the flow of pee.  Infections of the organs that make pee, store it, and take it out of the body.  Swelling or irritation of your blood vessels. What increases the risk?  Getting older.  Having someone in your family who has kidney disease or kidney failure.  Having a disease caused by genes.  Taking medicines often that harm the kidneys.  Being near or having contact with harmful substances.  Being very overweight.  Using tobacco now or in the past. What are the signs or symptoms?  Feeling very tired.  Having a swollen face, legs, ankles, or feet.  Feeling like you may vomit or vomiting.  Not feeling hungry.  Being confused or not able to focus.  Twitches and cramps in the leg muscles or other muscles.  Dry, itchy skin.  A taste of metal in your  mouth.  Making less pee, or making more pee.  Shortness of breath.  Trouble sleeping.  You may also become anemic or get weak bones. Anemic means there is not enough red blood cells or hemoglobin in your blood. You may get symptoms slowly. You may not notice them until the kidney damage gets very bad. How is this treated? Often, there is no cure for this disease. Treatment can help with symptoms and help keep the disease from getting worse. You may need to:  Avoid alcohol.  Avoid foods that are high in salt, potassium, phosphorous, and protein.  Take medicines for symptoms and to help control other conditions.  Have dialysis. This treatment gets harmful waste out of your body.  Treat other problems that cause your kidney disease or make it worse. Follow these instructions at home: Medicines  Take over-the-counter and prescription medicines only as told by your doctor.  Do not take any new medicines, vitamins, or supplements unless your doctor says it is okay. Lifestyle  Do not smoke or use any products that contain nicotine or tobacco. If you need help quitting, ask your doctor.  If you drink alcohol: ? Limit how much you use to:  0-1 drink a day for women who are not pregnant.  0-2 drinks a day for men. ? Know how much alcohol is in your drink. In the [place] one drink equals one 12 oz bottle of beer (355 mL), one 5 oz glass of wine (148 mL), or one 1 oz glass of hard liquor (44 mL).  Stay at a healthy weight. If you need help losing weight, ask your doctor.   General instructions  Follow instructions from your doctor about what you cannot eat or drink.  Track your blood pressure at home. Tell your doctor about any changes.  If you have diabetes, track your blood sugar.  Exercise at least 30 minutes a day, 5 days a week.  Keep your shots (vaccinations) up to date.  Keep all follow-up visits.   Where to find more information  American Association of Kidney  Patients: BombTimer.gl  National Kidney Foundation: www.kidney.[place]: https://mathis.com/  Life Options: www.lifeoptions.org  [employer]idney [employer]: www.kidneyschool.org Contact a doctor if:  Your symptoms get worse.  You get new symptoms. Get help right away if:  You get symptoms of end-stage kidney disease. These include: ? Headaches. ? Losing feeling in your hands or feet. ? Easy bruising. ? Having hiccups often. ? Chest pain. ? Shortness of breath. ? Lack of menstrual periods, in women.  You have a fever.  You make less pee than normal.  You have pain or you bleed when you pee or poop. These symptoms may be an emergency. Get help right away. Call your local emergency services (911 in the [place]  Do not wait to see if the symptoms will go away.  Do not drive yourself to the hospital. Summary  Chronic kidney disease is when lasting damage happens to the kidneys slowly over a long time.  Causes of this disease include diabetes and high blood pressure.  Often, there is no cure for this disease. Treatment can help symptoms and help keep the disease from getting worse.  Treatment may involve lifestyle changes, medicines, and dialysis. This information is not intended to replace advice given to you by your health care provider. Make sure you discuss any questions you have with your health care provider. Document Revised: 11/09/2019 Document Reviewed: 11/09/2019 Elsevier Patient Education  [place] on my medicine - ELIQUIS (apixaban)  This medication education was reviewed with  me or my healthcare representative as part of my discharge preparation.   Why was Eliquis prescribed for you? Eliquis was prescribed for you to reduce the risk of a blood clot forming that can cause a stroke if you have a medical condition called atrial fibrillation (a type of irregular heartbeat).  What do You need to know about Eliquis ? Take your  Eliquis TWICE DAILY - one tablet in the morning and one tablet in the evening with or without food. If you have difficulty swallowing the tablet whole please discuss with your pharmacist how to take the medication safely.  Take Eliquis exactly as prescribed by your doctor and DO NOT stop taking Eliquis without talking to the doctor who prescribed the medication.  Stopping may increase your risk of developing a stroke.  Refill your prescription before you run out.  After discharge, you should have regular check-up appointments with your healthcare provider that is prescribing your Eliquis.  In the future your dose may need to be changed if your kidney function or weight changes by a significant amount or as you get older.  What do you do if you miss a dose? If you miss a dose, take it as soon as you remember on the same day and resume taking twice daily.  Do not take more than one dose of ELIQUIS at the same time to make up a missed dose.  Important Safety Information A possible side effect of Eliquis is bleeding. You should call your healthcare provider right away if you experience any of the following: ? Bleeding from an injury or your nose that does not stop. ? Unusual colored urine (red or dark brown) or unusual colored stools (red or black). ? Unusual bruising for unknown reasons. ? A serious fall or if you hit your head (even if there is no bleeding).  Some medicines may interact with Eliquis and might increase your risk of bleeding or clotting while on Eliquis. To help avoid this, consult your healthcare provider or pharmacist prior to using any new prescription or non-prescription medications, including herbals, vitamins, non-steroidal anti-inflammatory drugs (NSAIDs) and supplements.  This website has more information on Eliquis (apixaban): http://www.eliquis.com/eliquis/home

## 2020-12-09 NOTE — Discharge Summary (Signed)
Discharge Summary  Ann Flynn SPQ:[id number] DOB: [birth year]  PCP: Birdie Sons, MD  Admit date: 12/06/2020 Discharge date: 12/09/2020  Time spent: 35 minutes.  Recommendations for Outpatient Follow-up:  1. Follow-up with cardiology in 1 to 2 weeks. 2. Follow-up with your nephrologist within a week. 3. Obtain repeat BMP on 12/13/2020 at your nephrologist office. 4. Follow-up with your primary care provider in 1 to 2 weeks. 5. Take your medications as prescribed.  Discharge Diagnoses:  Active Hospital Problems   Diagnosis Date Noted  . A-fib ([place]) 12/06/2020  . Atrial fibrillation, chronic (Ann Flynn) 03/24/2020    Resolved Hospital Problems  No resolved problems to display.    Discharge Condition: Stable.  Diet recommendation: Heart healthy diet.  Vitals:   12/09/20 0034 12/09/20 0338  BP: (!) 120/58 (!) 135/58  Pulse: 67 69  Resp: 20 18  Temp: 98.2 F (36.8 C) 98.1 F (36.7 C)  SpO2: 97% 96%    History of present illness:  Tacy Ann Flynn a 80 y.o.femalewith medical history significant ofPAF on Coumadin, diastolic CHF, moderate pulmonary hypertension,IDDM2, HLD,CKD stage IIIB,OSA on CPAP at bedtime, morbid obesity,camewith new onset of palpitations, lightheadedness.  Work-up revealed A. fib/A-flutter with RVR and supratherapeutic INR 8.6.  Patient has not checked her INR in the last month due to scheduling problems.  She had denied any overt bleeding.  She was successfully cardioverted on 12/07/2020 by cardiology.  Hospital course complicated by 1 episode of hypoglycemia for which her Lantus dose was reduced from her home dose of 45 unit nightly to 30 units nightly.  Also complicated by AKI on CKD 3B.  Her home torsemide, losartan and home Jardiance were held.  Patient advised to have a close follow-up appointment with her nephrologist Dr. Candiss Norse.  Patient will repeat her BMP on 12/13/2020.  12/09/20:  Seen at her bedside.  There were no acute events overnight.   She has no new complaints.  She was seen by cardiology, okay to discharge.  Recommended to take torsemide 20 mg as needed when weight is greater than 240 pounds, greater than 3 pounds weight gain in 1 day or weight gain greater than 5 pounds in 1 week.  Patient understands and agrees with the plan and she is able to teach back with accuracy.  Hospital Course:  Active Problems:   Atrial fibrillation, chronic (H[facility])   A-fib ([facility])  Resolved A flutter/A. fib with RVR status post successful cardioversion on 12/07/2020 by cardiology Dr. [name] She converted back to sinus rhythm. Was initially on amiodarone drip, switched to p.o. amiodarone 200 mg daily She was previously on Coumadin for CVA prevention, discontinued. Switched to Eliquis on 12/08/2020. Follow-up with cardiology in 1 to 2 weeks.  AKI on CKD 3B, likely multifactorial secondary to ARB, diuretics, hypotension. Baseline creatinine appears to be 1.6 with GFR of 31. Creatinine is uptrending, 2.54 from 2.41 with GFR of 20. Continue to avoid nephrotoxic agents like NSAIDs, avoid hypotension. Advised close follow-up appointment with nephrologist Dr. Candiss Norse within a week. Patient understands and agrees with plan. Continue to hold off losartan, Jardiance. Torsemide will be used as needed.  Resolved hypoglycemia in the setting of type 2 diabetes Fasting serum glucose 51 on 12/08/2020 after taking 45 units of Lantus the night prior.  Home Lantus dose reduced to 30 units nightly.  Fasting serum glucose this morning 146. Last hemoglobin A1c 7.6 on 10/23/2020. Hold off home Jardiance due to AKI.  Essential hypertension BP were initially soft improved after holding off losartan  and torsemide.   BP this morning 135/58.  Follow-up with your PCP and cardiology.  Resolved supratherapeutic INR Presented with INR greater than 8.6 INR 1.8 on 12/08/2020. Patient wants to switch to DOAC.  Resolved leukocytosis, likely reactive Chest x-ray  nonacute. No evidence of active infective process. Nontoxic-appearing Afebrile  Improved generalized weakness Assessed by PT OT with no further recommendations. Continue fall precautions Continue to encourage oral intake.  Pulmonary artery hypertension Management per cardiology Follow-up with cardiology.  OSA on CPAP Counseled on the importance of CPAP compliance. Wear CPAP nightly. Follow-up with your pulmonologist.  Obesity BMI 38 Recommend weight loss outpatient with regular physical activity and healthy dieting.    Code Status: Full code.    Consultants:  Cardiology.  Procedures:  2D echo.  Antimicrobials:  None  DVT prophylaxis: Eliquis.   Discharge Exam: BP (!) 135/58 (BP Location: Left Arm)   Pulse 69   Temp 98.1 F (36.7 C) (Oral)   Resp 18   Ht _0  (1.651 m)   Wt 107.3 kg   SpO2 96%   BMI 39.36 kg/m  . General: 80 y.o. year-old female well developed well nourished in no acute distress.  Alert and oriented x3. . Cardiovascular: Regular rate and rhythm with no rubs or gallops.  No thyromegaly or JVD noted.   [name] Respiratory: Clear to auscultation with no wheezes or rales. Good inspiratory effort. . Abdomen: Soft nontender nondistended with normal bowel sounds x4 quadrants. . Musculoskeletal: No lower extremity edema. . Skin: No ulcerative lesions noted or rashes . Psychiatry: Mood is appropriate for condition and setting  Discharge Instructions You were cared for by a hospitalist during your hospital stay. If you have any questions about your discharge medications or the care you received while you were in the hospital after you are discharged, you can call the unit and asked to speak with the hospitalist on call if the hospitalist that took care of you is not available. Once you are discharged, your primary care physician will handle any further medical issues. Please note that NO REFILLS for any discharge medications will be  authorized once you are discharged, as it is imperative that you return to your primary care physician (or establish a relationship with a primary care physician if you do not have one) for your aftercare needs so that they can reassess your need for medications and monitor your lab values.  Discharge Instructions    Amb referral to [facility]   Complete by: As directed      Allergies as of 12/09/2020      Reactions   Penicillins Swelling   Facial swelling Did it involve swelling of the face/tongue/throat, SOB, or low BP? Yes Did it involve sudden or severe rash/hives, skin peeling, or any reaction on the inside of your mouth or nose? No Did you need to seek medical attention at a hospital or doctor's office? No When did it last happen?10 + years If all above answers are "NO", may proceed with cephalosporin use.   Sulfa Antibiotics Rash   Uptravi [selexipag] Other (See Comments)   headaches      Medication List    STOP taking these medications   empagliflozin 10 MG Tabs tablet Commonly known as: Jardiance   losartan 25 MG tablet Commonly known as: COZAAR   phenylephrine 10 MG Tabs tablet Commonly known as: SUDAFED PE   sitaGLIPtin 25 MG tablet Commonly known as: JANUVIA   warfarin 4 MG tablet Commonly known as:  COUMADIN     TAKE these medications   acetaminophen 650 MG CR tablet Commonly known as: TYLENOL Take 650-1,300 mg by mouth every 8 (eight) hours as needed for pain.   ambrisentan 5 MG tablet Commonly known as: LETAIRIS Take 5 mg by mouth at bedtime.   amiodarone 200 MG tablet Commonly known as: PACERONE Take 1 tablet (200 mg total) by mouth daily.   apixaban 5 MG Tabs tablet Commonly known as: ELIQUIS Take 1 tablet (5 mg total) by mouth 2 (two) times daily.   atorvastatin 80 MG tablet Commonly known as: LIPITOR Take 1 tablet (80 mg total) by mouth daily. What changed: when to take this   BD Pen Needle Nano U/F 32G X 4 MM Misc Generic drug:  Insulin Pen Needle USE TO INJECT ONCE DAILY AS DIRECTED   docusate sodium 100 MG capsule Commonly known as: COLACE Take 100 mg by mouth daily as needed (constipation.).   insulin glargine (1 Unit Dial) 300 UNIT/ML Solostar Pen Commonly known as: TOUJEO Inject 30 Units into the skin at bedtime. What changed:   how much to take  how to take this  when to take this  additional instructions   metoprolol succinate 25 MG 24 hr tablet Commonly known as: TOPROL-XL Take 0.5 tablets (12.5 mg total) by mouth in the morning. Start taking on: December 10, 2020 What changed: how much to take   omeprazole 40 MG capsule Commonly known as: PRILOSEC Take 1 capsule (40 mg total) by mouth daily. What changed: when to take this   OneTouch Ultra test strip Generic drug: glucose blood USE AS DIRECTED   PRESCRIPTION MEDICATION CPAP: At bedtime   tadalafil (PAH) 20 MG tablet Commonly known as: ADCIRCA Take 2 tablets (40 mg total) by mouth at bedtime.   torsemide 20 MG tablet Commonly known as: DEMADEX Take 1 tablet (20 mg total) by mouth daily as needed (Take Torsemide 20 mg as needed daily if weight >240 lbs which is your normal dry weight.  Take if increase of weight 3 lbs per day or 5 lbs per week.). What changed:   when to take this  reasons to take this   Trulicity 1.61 WR/6.0AV Sopn Generic drug: Dulaglutide Inject 0.75 mg into the skin once a week. What changed: when to take this   venlafaxine 75 MG tablet Commonly known as: EFFEXOR Take 1 tablet (75 mg total) by mouth daily.   vitamin B-12 1000 MCG tablet Commonly known as: CYANOCOBALAMIN Take 1,000 mcg by mouth in the morning and at bedtime.   Vitamin D3 50 MCG (2000 UT) Tabs Take 2,000 Units by mouth in the morning and at bedtime.      Allergies  Allergen Reactions  . Penicillins Swelling    Facial swelling Did it involve swelling of the face/tongue/throat, SOB, or low BP? Yes Did it involve sudden or severe  rash/hives, skin peeling, or any reaction on the inside of your mouth or nose? No Did you need to seek medical attention at a hospital or doctor's office? No When did it last happen?10 + years If all above answers are "NO", may proceed with cephalosporin use.   . Sulfa Antibiotics Rash  . Malvin Johns [Selexipag] Other (See Comments)    headaches    Follow-up Information    Constance Haw, MD Follow up.   Specialty: Cardiology Why: on 5/12 at 230 to discuss ablation Contact information: [place] [address] [phone]  Birdie Sons, MD. Call in 1 day(s).   Specialty: Family Medicine Why: Please call for a post hospital follow-up appointment. Contact information: [address] [place] [address] [phone]        Bensimhon, Shaune Pascal, MD .   Specialty: Cardiology Contact information: [place] [address] [phone]        Murlean Iba, MD. Call in 1 day(s).   Specialty: Nephrology Why: Please call for a close post hospital follow-up appointment. Contact information: [place] [address] [phone]                The results of significant diagnostics from this hospitalization (including imaging, microbiology, ancillary and laboratory) are listed below for reference.    Significant Diagnostic Studies: DG Lumbar Spine Complete  Result Date: 11/29/2020 CLINICAL DATA:  Acute lower back and left hip pain without known injury. EXAM: LUMBAR SPINE - COMPLETE 4+ VIEW COMPARISON:  None. FINDINGS: No fracture or spondylolisthesis is noted. Severe degenerative disc disease is noted at L4-5. Mild degenerative disc disease is noted at L2-3 and L3-4. IMPRESSION: Multilevel degenerative disc disease. No acute abnormality seen in the lumbar spine. Aortic Atherosclerosis (ICD10-I70.0). Electronically Signed   By: Marijo Conception M.D.   On:  11/29/2020 08:52   DG Chest Port 1 View  Result Date: 12/06/2020 CLINICAL DATA:  Dizziness. EXAM: PORTABLE CHEST 1 VIEW COMPARISON:  Single-view of the chest 03/23/2020. FINDINGS: Punctate calcification projecting in the right lower lobe is unchanged. Lungs otherwise clear. Cardiomegaly. No pneumothorax or pleural effusion. Aortic atherosclerosis. No acute or focal bony abnormality. IMPRESSION: No acute disease. Cardiomegaly. Atherosclerosis. Electronically Signed   By: Inge Rise M.D.   On: 12/06/2020 13:30   ECHOCARDIOGRAM COMPLETE  Result Date: 12/05/2020    ECHOCARDIOGRAM REPORT   Patient Name:   Ann Flynn Date of Exam: 12/05/2020 Medical Rec #:  [id number]     Height:       65.0 in Accession #:    [id number]    Weight:       238.0 lb Date of Birth:  1940-09-22     BSA:          2.130 m Patient Age:    60 years      BP:           176/65 mmHg Patient Gender: F             HR:           70 bpm. Exam Location:  Outpatient Procedure: 2D Echo, 3D Echo, Cardiac Doppler, Color Doppler and Strain Analysis Indications:    I50.40* Unspecified combined systolic (congestive) and diastolic                 (congestive) heart failure  History:        Patient has prior history of Echocardiogram examinations, most                 recent 08/06/2020. CHF, Abnormal ECG, Pulmonary HTN,                 Arrythmias:Atrial Fibrillation, Signs/Symptoms:Syncope; Risk                 Factors:Diabetes. Pulmonary embolus.  Sonographer:    Roseanna Rainbow Referring Phys: [id number] DANIEL R BENSIMHON  Sonographer Comments: Technically difficult study due to poor echo windows and patient is morbidly obese. Image acquisition challenging due to patient body habitus.  Global longitudinal strain was attempted. IMPRESSIONS  1. Left ventricular ejection fraction, by estimation, is 55 to 60%. The left ventricle has normal function. The left ventricle has no regional wall motion abnormalities. There is mild left ventricular hypertrophy. Left  ventricular diastolic parameters are consistent with Grade II diastolic dysfunction (pseudonormalization).  2. Right ventricular systolic function is normal. The right ventricular size is normal.  3. Left atrial size was mild to moderately dilated.  4. Right atrial size was mildly dilated.  5. The mitral valve is normal in structure. Trivial mitral valve regurgitation. Mild mitral stenosis. Moderate mitral annular calcification.  6. The aortic valve is normal in structure. There is moderate calcification of the aortic valve. Aortic valve regurgitation is not visualized. Mild to moderate aortic valve sclerosis/calcification is present, without any evidence of aortic stenosis.  7. Aortic dilatation noted. There is mild dilatation of the aortic root, measuring 44 mm.  8. The inferior vena cava is normal in size with greater than 50% respiratory variability, suggesting right atrial pressure of 3 mmHg. FINDINGS  Left Ventricle: Left ventricular ejection fraction, by estimation, is 55 to 60%. The left ventricle has normal function. The left ventricle has no regional wall motion abnormalities. The left ventricular internal cavity size was normal in size. There is  mild left ventricular hypertrophy. Left ventricular diastolic parameters are consistent with Grade II diastolic dysfunction (pseudonormalization). Right Ventricle: The right ventricular size is normal. No increase in right ventricular wall thickness. Right ventricular systolic function is normal. Left Atrium: Left atrial size was mild to moderately dilated. Right Atrium: Right atrial size was mildly dilated. Pericardium: There is no evidence of pericardial effusion. Mitral Valve: The mitral valve is normal in structure. There is moderate thickening of the mitral valve leaflet(s). There is mild calcification of the mitral valve leaflet(s). Moderate mitral annular calcification. Trivial mitral valve regurgitation. Mild mitral valve stenosis. MV peak gradient, 5.3  mmHg. The mean mitral valve gradient is 2.0 mmHg. Tricuspid Valve: The tricuspid valve is normal in structure. Tricuspid valve regurgitation is trivial. No evidence of tricuspid stenosis. Aortic Valve: The aortic valve is normal in structure. There is moderate calcification of the aortic valve. Aortic valve regurgitation is not visualized. Mild to moderate aortic valve sclerosis/calcification is present, without any evidence of aortic stenosis. Pulmonic Valve: The pulmonic valve was grossly normal. Pulmonic valve regurgitation is mild. No evidence of pulmonic stenosis. Aorta: Aortic dilatation noted. There is mild dilatation of the aortic root, measuring 44 mm. Venous: The inferior vena cava is normal in size with greater than 50% respiratory variability, suggesting right atrial pressure of 3 mmHg. IAS/Shunts: No atrial level shunt detected by color flow Doppler.  LEFT VENTRICLE PLAX 2D LVIDd:         5.05 cm LVIDs:         3.30 cm LV PW:         1.55 cm LV IVS:        1.30 cm LVOT diam:     2.00 cm LV SV:         69 LV SV Index:   32 LVOT Area:     3.14 cm  LV Volumes (MOD) LV vol d, MOD A2C: 88.4 ml LV vol d, MOD A4C: 115.0 ml LV vol s, MOD A2C: 40.8 ml LV vol s, MOD A4C: 50.9 ml LV SV MOD A2C:     47.6 ml LV SV MOD A4C:     115.0 ml LV SV MOD BP:  54.1 ml RIGHT VENTRICLE            IVC RV S prime:     8.38 cm/s  IVC diam: 1.70 cm TAPSE (M-mode): 1.6 cm LEFT ATRIUM             Index       RIGHT ATRIUM           Index LA diam:        4.00 cm 1.88 cm/m  RA Area:     13.20 cm LA Vol (A2C):   74.1 ml 34.79 ml/m RA Volume:   25.10 ml  11.78 ml/m LA Vol (A4C):   41.8 ml 19.63 ml/m LA Biplane Vol: 56.3 ml 26.43 ml/m  AORTIC VALVE LVOT Vmax:   122.00 cm/s LVOT Vmean:  86.700 cm/s LVOT VTI:    0.219 m  AORTA Ao Root diam: 3.60 cm Ao Asc diam:  4.40 cm MITRAL VALVE MV Area (PHT): 2.58 cm     SHUNTS MV Area VTI:   1.30 cm     Systemic VTI:  0.22 m MV Peak grad:  5.3 mmHg     Systemic Diam: 2.00 cm MV Mean grad:   2.0 mmHg MV Vmax:       1.16 m/s MV Vmean:      63.4 cm/s MV Decel Time: 294 msec MV E velocity: 120.50 cm/s MV A velocity: 112.00 cm/s MV E/A ratio:  1.08 Glori Bickers MD Electronically signed by Glori Bickers MD Signature Date/Time: 12/05/2020/1:10:29 PM    Final     Microbiology: Recent Results (from the past 240 hour(s))  SARS CORONAVIRUS 2 (TAT 6-24 HRS) Nasopharyngeal Nasopharyngeal Swab     Status: None   Collection Time: 12/06/20  6:33 PM   Specimen: Nasopharyngeal Swab  Result Value Ref Range Status   SARS Coronavirus 2 NEGATIVE NEGATIVE Final    Comment: (NOTE) SARS-CoV-2 target nucleic acids are NOT DETECTED.  The SARS-CoV-2 RNA is generally detectable in upper and lower respiratory specimens during the acute phase of infection. Negative results do not preclude SARS-CoV-2 infection, do not rule out co-infections with other pathogens, and should not be used as the sole basis for treatment or other patient management decisions. Negative results must be combined with clinical observations, patient history, and epidemiological information. The expected result is Negative.  Fact Sheet for Patients: SugarRoll.be  Fact Sheet for Healthcare Providers: https://www.woods-mathews.com/  This test is not yet approved or cleared by the [place] FDA and  has been authorized for detection and/or diagnosis of SARS-CoV-2 by FDA under an Emergency Use Authorization (EUA). This EUA will remain  in effect (meaning this test can be used) for the duration of the COVID-19 declaration under Se ction 564(b)(1) of the Act, 21 U.S.C. section 360bbb-3(b)(1), unless the authorization is terminated or revoked sooner.  Performed at [facility], [place] [address] [place], [place] [address]      Labs: Basic Metabolic Panel: Recent Labs  Lab 12/06/20 1213 12/06/20 1709 12/07/20 0244 12/08/20 0319 12/09/20 0150  NA 140  --  138 138 137   K 3.9  --  3.6 3.7 4.1  CL 102  --  102 103 104  CO2 30  --  _0 GLUCOSE 134*  --  86 51* 146*  BUN 65*  --  63* 60* 57*  CREATININE 1.90*  --  1.94* 2.41* 2.52*  CALCIUM 8.5*  --  8.1* 8.0* 8.0*  MG  --  2.0 1.9  2.1  --    Liver Function Tests: Recent Labs  Lab 12/06/20 1213  AST 26  ALT 41  ALKPHOS 54  BILITOT 0.6  PROT 5.7*  ALBUMIN 3.3*   No results for input(s): LIPASE, AMYLASE in the last 168 hours. No results for input(s): AMMONIA in the last 168 hours. CBC: Recent Labs  Lab 12/06/20 1213 12/07/20 0244 12/08/20 0319 12/09/20 0150  WBC 14.8* 16.6* 10.9* 9.6  NEUTROABS 11.6*  --   --   --   HGB 12.8 10.9* 10.3* 9.5*  HCT 41.8 35.0* 33.2* 30.2*  MCV 99.8 97.5 97.9 98.4  PLT 272 220 178 164   Cardiac Enzymes: No results for input(s): CKTOTAL, CKMB, CKMBINDEX, TROPONINI in the last 168 hours. BNP: BNP (last 3 results) Recent Labs    01/27/20 1150  BNP 450.6*    ProBNP (last 3 results) No results for input(s): PROBNP in the last 8760 hours.  CBG: Recent Labs  Lab 12/08/20 1536 12/08/20 2042 12/08/20 2119 12/09/20 0342 12/09/20 0743  GLUCAP 77 136* 166* 128* 160*       Signed:  Kayleen Memos, MD [facility] 12/09/2020, 12:45 PM

## 2020-12-09 NOTE — Progress Notes (Addendum)
Progress Note  Patient Name: Ann Flynn Date of Encounter: 12/09/2020  [facility] Cardiologist: Glori Bickers, MD   Subjective   IShe denies any chest pain or shortness of breath.  Creatinine remains elevated but stable (2.4 > 2.5)  Inpatient Medications    Scheduled Meds: . amiodarone  200 mg Oral Daily  . apixaban  5 mg Oral BID  . atorvastatin  80 mg Oral QHS  . empagliflozin  10 mg Oral QAC breakfast  . insulin aspart  0-15 Units Subcutaneous TID WC  . insulin glargine  30 Units Subcutaneous QHS  . linagliptin  5 mg Oral Daily  . metoprolol succinate  12.5 mg Oral q AM  . pantoprazole  40 mg Oral Daily  . tadalafil (PAH)  40 mg Oral QHS  . venlafaxine  75 mg Oral Daily  . vitamin B-12  1,000 mcg Oral Daily   Continuous Infusions:  PRN Meds: acetaminophen, alum & mag hydroxide-simeth, docusate sodium, ondansetron (ZOFRAN) IV   Vital Signs    Vitals:   12/08/20 1534 12/08/20 1944 12/09/20 0034 12/09/20 0338  BP: (!) 107/44 (!) 132/45 (!) 120/58 (!) 135/58  Pulse: 70 77 67 69  Resp: _0 Temp: 97.7 F (36.5 C) 98.1 F (36.7 C) 98.2 F (36.8 C) 98.1 F (36.7 C)  TempSrc: Oral Oral Oral Oral  SpO2: 96% 97% 97% 96%  Weight:    107.3 kg  Height:        Intake/Output Summary (Last 24 hours) at 12/09/2020 0936 Last data filed at 12/08/2020 2246 Gross per 24 hour  Intake --  Output 650 ml  Net -650 ml   Last 3 Weights 12/09/2020 12/07/2020 12/06/2020  Weight (lbs) 236 lb 8 oz 233 lb 1.6 oz 234 lb  Weight (kg) 107.276 kg 105.733 kg 106.142 kg      Telemetry    Normal sinus rhythm with rate 60s to 70s s- Personally Reviewed  ECG    No new EKG- Personally Reviewed  Physical Exam   GEN: No acute distress.   Neck: No JVD Cardiac: RRR, no murmurs, rubs, or gallops.  Respiratory: Clear to auscultation bilaterally. GI: Soft, nontender, non-distended  MS: No edema; No deformity. Neuro:  Nonfocal  Psych: Normal affect   Labs    High  Sensitivity Troponin:  No results for input(s): TROPONINIHS in the last 720 hours.    Chemistry Recent Labs  Lab 12/06/20 1213 12/07/20 0244 12/08/20 0319 12/09/20 0150  NA 140 138 138 137  K 3.9 3.6 3.7 4.1  CL 102 102 103 104  CO2 _1 GLUCOSE 134* 86 51* 146*  BUN 65* 63* 60* 57*  CREATININE 1.90* 1.94* 2.41* 2.52*  CALCIUM 8.5* 8.1* 8.0* 8.0*  PROT 5.7*  --   --   --   ALBUMIN 3.3*  --   --   --   AST 26  --   --   --   ALT 41  --   --   --   ALKPHOS 54  --   --   --   BILITOT 0.6  --   --   --   GFRNONAA 27* 26* 20* 19*  ANIONGAP _2 Hematology Recent Labs  Lab 12/07/20 0244 12/08/20 0319 12/09/20 0150  WBC 16.6* 10.9* 9.6  RBC 3.59* 3.39* 3.07*  HGB 10.9* 10.3* 9.5*  HCT 35.0* 33.2* 30.2*  MCV 97.5 97.9 98.4  MCH  30.4 30.4 30.9  MCHC 31.1 31.0 31.5  RDW 14.6 14.9 14.7  PLT 220 178 164    BNPNo results for input(s): BNP, PROBNP in the last 168 hours.   DDimer No results for input(s): DDIMER in the last 168 hours.   Radiology    No results found.  Cardiac Studies     Patient Profile     80 y.o. female h/omorbid obesity, DM, OSA on CPAP, CKD 4,PAD, CVA, carotid surgery in 2010,pulmonary embolism (2009) and [place]  who we are consulted for atrial flutter  Assessment & Plan    1. A flutter RVR -Had DCCV 06/2020.  Presented with atrial flutter with RVR and supratherapeutic INR -Started amio drip and underwent successful cardioversion 4/22.  Switch to p.o. amio 200 mg daily, would continue on discharge -EP consulted, seen by Dr. Curt Bears, recommend amiodarone and outpatient follow-up for consideration of flutter ablation. -Has been on warfarin but reports had missed recent visits to monitor her INR and presented with supratherapeutic INR.  Recommend switching to DOAC.  She previously has not been on DOAC due to cost.  Per pharmacy her co-pay for Eliquis would be $28, she reports she is able to afford this.  Switched to Eliquis 5 mg twice  daily  2. Supra Therapeutic INR Holding coumadin as noted above, now subtherapeutic.  Switching to Eliquis as above  3. Pulmonary HTN (severe) with cor pulmonale -Suspect combination of WHO Group 1 & 3 disease - VQ 07/07/19: No PE - RV functionremainsnormal on echo despite severe PAH - PFTs with significant restrictive lung physiology -PA pressures12/21very high butPA pulsativity and cardiac output much higher than would besuspectedin true PAH with pressures of this magnitude.  - cMRI showed no evidence of shunt, Qp/Qs=0.94. -Continue O2 supplementation and CPAP. -Continue tadalafil 40 mg and letairis 5 mg (unable to tolerate higher dose of letairis). - Echo 12/05/20 LVEF 60% RV normal - Unable to tolerate selexipag currently. Can consider rechallenge in the future.  4. Chronic HFpEF EF 60%RV normal  NYHA III. Volume status ok. Held torsemide 20 mg daily given AKI as below  5.  AKI on CKD Stage IV Creatinine baseline 1.8-2  Creatinine elevated (2.4>2.5). Would hold losartan for now, can add back as outpatient if stable renal function.  Appears euvolemic, recommend holding torsemide for now.  She reports she checks weight daily at home and dry weight is 240 lbs.  Currently 237 lbs.  Recommend checking daily weights and restart torsemide if gains greater then 3 lbs in a day or 5lbs in a week. Would check BMET within 1 week of discharge.  For questions or updates, please contact [place] Please consult [email or web address] for contact info under        Signed, Donato Heinz, MD  12/09/2020, 9:36 AM

## 2020-12-10 ENCOUNTER — Encounter (HOSPITAL_COMMUNITY): Payer: Self-pay | Admitting: Internal Medicine

## 2020-12-10 LAB — GLUCOSE, CAPILLARY: Glucose-Capillary: 115 mg/dL — ABNORMAL HIGH (ref 70–99)

## 2020-12-14 ENCOUNTER — Ambulatory Visit (INDEPENDENT_AMBULATORY_CARE_PROVIDER_SITE_OTHER): Payer: Medicare Other | Admitting: Family Medicine

## 2020-12-14 ENCOUNTER — Other Ambulatory Visit: Payer: Self-pay

## 2020-12-14 ENCOUNTER — Encounter: Payer: Self-pay | Admitting: Family Medicine

## 2020-12-14 VITALS — BP 120/60 | HR 60 | Temp 98.0°F | Resp 20 | Ht 65.0 in | Wt 241.0 lb

## 2020-12-14 DIAGNOSIS — M5030 Other cervical disc degeneration, unspecified cervical region: Secondary | ICD-10-CM

## 2020-12-14 DIAGNOSIS — M5136 Other intervertebral disc degeneration, lumbar region: Secondary | ICD-10-CM | POA: Diagnosis not present

## 2020-12-14 DIAGNOSIS — M5432 Sciatica, left side: Secondary | ICD-10-CM | POA: Diagnosis not present

## 2020-12-14 MED ORDER — HYDROCODONE-ACETAMINOPHEN 5-325 MG PO TABS: 1.0000 | ORAL_TABLET | Freq: Four times a day (QID) | ORAL | 0 refills | Status: DC | PRN

## 2020-12-14 MED ORDER — OMEPRAZOLE 40 MG PO CPDR: 40.0000 mg | DELAYED_RELEASE_CAPSULE | Freq: Two times a day (BID) | ORAL | 1 refills | Status: DC

## 2020-12-14 NOTE — Progress Notes (Signed)
Established patient visit   Patient: Ann Flynn   DOB: 02-08-1941   80 y.o. Female  MRN: [id number] Visit Date: 12/14/2020  Today's healthcare provider: Lelon Huh, MD   Chief Complaint  Patient presents with  . Hospitalization Follow-up  . Hip Pain   Subjective    HPI  Follow up Hospitalization  Patient was admitted to [facility] on 12/06/2020 and discharged on 12/09/2020. She was treated for irregular heart rate and chest pain. Treatment for this included starting on amiodarone 258m daily and Eliquis 54mBID.  She reports good compliance with treatment. She reports this condition is improved.  Hip pain Patient reports that she is still having pain in her left hip. She was treated for this on 11/27/2020 with prednisone and Norco as needed. She reports that it helped a little, but pain is still bothersome.       Medications: Outpatient Medications Prior to Visit  Medication Sig  . acetaminophen (TYLENOL) 650 MG CR tablet Take 650-1,300 mg by mouth every 8 (eight) hours as needed for pain.  . Marland Kitchenmbrisentan (LETAIRIS) 5 MG tablet Take 5 mg by mouth at bedtime.  . Marland Kitchenmiodarone (PACERONE) 200 MG tablet Take 1 tablet (200 mg total) by mouth daily.  . Marland Kitchenpixaban (ELIQUIS) 5 MG TABS tablet Take 1 tablet (5 mg total) by mouth 2 (two) times daily.  . Marland Kitchentorvastatin (LIPITOR) 80 MG tablet Take 1 tablet (80 mg total) by mouth daily. (Patient taking differently: Take 80 mg by mouth at bedtime.)  . BD PEN NEEDLE NANO U/F 32G X 4 MM MISC USE TO INJECT ONCE DAILY AS DIRECTED  . Cholecalciferol (VITAMIN D3) 50 MCG (2000 UT) TABS Take 2,000 Units by mouth in the morning and at bedtime.  . docusate sodium (COLACE) 100 MG capsule Take 100 mg by mouth daily as needed (constipation.).   . Marland Kitchenulaglutide (TRULICITY) 0.8.38GZC/6.5MMOPN Inject 0.75 mg into the skin once a week. (Patient taking differently: Inject 0.75 mg into the skin every Friday.)  . insulin glargine, 1 Unit Dial, (TOUJEO) 300 UNIT/ML  Solostar Pen Inject 30 Units into the skin at bedtime.  . metoprolol succinate (TOPROL-XL) 25 MG 24 hr tablet Take 0.5 tablets (12.5 mg total) by mouth in the morning.  . Marland Kitchenmeprazole (PRILOSEC) 40 MG capsule Take 1 capsule (40 mg total) by mouth daily. (Patient taking differently: Take 40 mg by mouth daily before breakfast.)  . ONETOUCH ULTRA test strip USE AS DIRECTED  . PRESCRIPTION MEDICATION CPAP: At bedtime  . tadalafil, PAH, (ADCIRCA) 20 MG tablet Take 2 tablets (40 mg total) by mouth at bedtime.  . torsemide (DEMADEX) 20 MG tablet Take 1 tablet (20 mg total) by mouth daily as needed (Take Torsemide 20 mg as needed daily if weight >240 lbs which is your normal dry weight.  Take if increase of weight 3 lbs per day or 5 lbs per week.).  . Marland Kitchenenlafaxine (EFFEXOR) 75 MG tablet Take 1 tablet (75 mg total) by mouth daily.  . vitamin B-12 (CYANOCOBALAMIN) 1000 MCG tablet Take 1,000 mcg by mouth in the morning and at bedtime.   No facility-administered medications prior to visit.    Review of Systems  Constitutional: Positive for fatigue.  Respiratory: Negative for cough and shortness of breath.   Cardiovascular: Negative for chest pain, palpitations and leg swelling.  Musculoskeletal: Positive for arthralgias, back pain and myalgias.  Neurological: Negative for dizziness, weakness, light-headedness and headaches.       Objective  BP (!) 128/48   Pulse 60   Temp 98 F (36.7 C)   Resp 20   Ht 5' 5" (1.651 m)   Wt 241 lb (109.3 kg)   SpO2 100% Comment: w/ 3L of O2  BMI 40.10 kg/m     Physical Exam    General: Appearance:    Obese female in no acute distress  Eyes:    PERRL, conjunctiva/corneas clear, EOM's intact       Lungs:     Clear to auscultation bilaterally, respirations unlabored  Heart:    Normal heart rate. Irregularly irregular rhythm. No murmurs, rubs, or gallops.   MS:   All extremities are intact.   Neurologic:   Awake, alert, oriented x 3. No apparent focal  neurological           defect.         Assessment & Plan     1. DDD (degenerative disc disease), cervical  - Ambulatory referral to Pain Clinic  2. Sciatica, left side  - Ambulatory referral to Pain Clinic  3. DDD (degenerative disc disease), lumbar She would benefit from evaluation and treatment at pain management and she specifically requests Dr. Nicholaus Bloom in [place].       The entirety of the information documented in the History of Present Illness, Review of Systems and Physical Exam were personally obtained by me. Portions of this information were initially documented by the CMA and reviewed by me for thoroughness and accuracy.      Lelon Huh, MD  [facility] [phone] (phone) [phone] (fax)  [place]

## 2020-12-20 ENCOUNTER — Telehealth: Payer: Self-pay

## 2020-12-20 NOTE — Progress Notes (Signed)
Chronic Care Management Pharmacy Assistant   Name: Safina Huard  MRN: [id number] DOB: Oct 11, 1940  Reason for Encounter: Medication Review/Medication Coordination Call.   Recent office visits:  12/14/2020 Dr.Fisher MD (PCP)- Omeprazole 40 mg change from daily to 2 times daily 11/27/2020 Dr Caryn Section MD (PCP) - Started prednisone 20 mg one tablet twice a day for 7 days, Change Hydrocodone-Acetaminophen from 5-325 mg to 7.5-325 mg PRN.  Recent consult visits:  12/05/2020 Dr [name] MD (Cardiology)  11/26/2020 Audry Riles RPH-CPP (Heart Failure Clinic) decreased Uptravi to 400 mcg BID.  Hospital visits:  Medication Reconciliation was completed by comparing discharge summary, patient's EMR and Pharmacy list, and upon discussion with patient.  Admitted to the hospital on 12/06/2020 due to Atrial flutter with rapid ventricular response. Discharge date was 12/09/2020. Discharged from [facility]?Medications Started at [facility] Discharge:?? -started Amiodarone HCI 200 mg daily  -Eliquis 5 mg BID  Medication Changes at Hospital Discharge: -Changed Coumadin to Eliquis 5 mg Metoprolol change to 0.5 tablet once daily Change torsemide 20 mg to prn Change Lantus dose from 45 units to 30 units nightly Medications Discontinued at Hospital Discharge: -Losartan on hold  Jardiance on hold due to AKI  Medications that remain the same after Hospital Discharge:??  -All other medications will remain the same.    Admitted to the hospital on 11/23/2020 due to Sciatic nerve pain. Discharge date was 11/23/2020. Discharged from [facility] [place]?Medications Started at [facility] Discharge:?? -started Hydrocodone-acetaminophen 5-325 mg PRN  Medication Changes at Hospital Discharge: -Changed None  Medications Discontinued at Hospital Discharge: -Stopped None   Medications that remain the same after Hospital Discharge:??  -All other medications will  remain the same.    Medications: Outpatient Encounter Medications as of 12/20/2020  Medication Sig Note  . acetaminophen (TYLENOL) 650 MG CR tablet Take 650-1,300 mg by mouth every 8 (eight) hours as needed for pain.   [name] ambrisentan (LETAIRIS) 5 MG tablet Take 5 mg by mouth at bedtime.   [name] amiodarone (PACERONE) 200 MG tablet Take 1 tablet (200 mg total) by mouth daily.   [name] apixaban (ELIQUIS) 5 MG TABS tablet Take 1 tablet (5 mg total) by mouth 2 (two) times daily.   [name] atorvastatin (LIPITOR) 80 MG tablet Take 1 tablet (80 mg total) by mouth daily. (Patient taking differently: Take 80 mg by mouth at bedtime.)   . BD PEN NEEDLE NANO U/F 32G X 4 MM MISC USE TO INJECT ONCE DAILY AS DIRECTED   . Cholecalciferol (VITAMIN D3) 50 MCG (2000 UT) TABS Take 2,000 Units by mouth in the morning and at bedtime.   . docusate sodium (COLACE) 100 MG capsule Take 100 mg by mouth daily as needed (constipation.).    [name] Dulaglutide (TRULICITY) 4.14 EL/9.5VU SOPN Inject 0.75 mg into the skin once a week. (Patient taking differently: Inject 0.75 mg into the skin every Friday.) 11/22/2020: Patient receives through [employer] Patient Assistance through December 2022  . insulin glargine, 1 Unit Dial, (TOUJEO) 300 UNIT/ML Solostar Pen Inject 30 Units into the skin at bedtime.   . metoprolol succinate (TOPROL-XL) 25 MG 24 hr tablet Take 0.5 tablets (12.5 mg total) by mouth in the morning.   [name] omeprazole (PRILOSEC) 40 MG capsule Take 1 capsule (40 mg total) by mouth in the morning and at bedtime.   [name] ULTRA test strip USE AS DIRECTED   . PRESCRIPTION MEDICATION CPAP: At bedtime   .  sitaGLIPtin (JANUVIA) 25 MG tablet Take 25 mg by mouth daily.   . tadalafil, PAH, (ADCIRCA) 20 MG tablet Take 2 tablets (40 mg total) by mouth at bedtime.   . torsemide (DEMADEX) 20 MG tablet Take 1 tablet (20 mg total) by mouth daily as needed (Take Torsemide 20 mg as needed daily if weight >240 lbs which is your normal dry weight.  Take if  increase of weight 3 lbs per day or 5 lbs per week.).   [name] venlafaxine (EFFEXOR) 75 MG tablet Take 1 tablet (75 mg total) by mouth daily.   . vitamin B-12 (CYANOCOBALAMIN) 1000 MCG tablet Take 1,000 mcg by mouth in the morning and at bedtime.    No facility-administered encounter medications on file as of 12/20/2020.    Star Rating Drugs: Atorvastatin 80 mg last filled on 11/27/2020 for 30 day supply at [facility]. Trulicity 2.83 TD/1.7 ML  Last filled on 10/27/2020 for 28 day supply at upstream pharmacy. Januvia 25 mg last filled on 12/17/2020 for 30 day supply at [facility] .   Reviewed chart for medication changes ahead of medication coordination call.   BP Readings from Last 3 Encounters:  12/14/20 120/60  12/09/20 (!) 135/58  12/06/20 102/62    Lab Results  Component Value Date   HGBA1C 7.6 (A) 10/23/2020     Patient obtains medications through Adherence Packaging  30 Days   Last adherence delivery included:  Atorvastatin 80 mg one tablet daily -Bedtime   Venlafaxine 75 mg one tablet daily -Breakfast   Toujeo U300 Inject 45units into the skin daily  Jardiance 10 mgone tabletDaily- Breakfast  Omeprazole 40 mgone capsuleDaily - Breakfast  Torsemide 20 mgone tabletDaily - Breakfast  Metoprolol Tartrate 25 mg one tablet daily- breakfast  Acute fill for Warfarin 4 mgTake 4 mg on Sun, Mon, Tues, Thurs, Fridayto be delivered  11/20/2020.  Patient declined Medications last month:  Losartan 25 mg one tablet daily -Bedtime(Nephrology Harmeet Singh hold losartan)  Warfarin 3 mgDaily - Vials(Not taking)  BD Pen Needle Nano - Daily(adequate supply,received 90DS on 61/60/7371)  Trulicity 0.62IR Inject 0.75 mg into the skin once a week (patient receiving Trulicity  from [facility])   DTPs from last delivery : Patient states she may not be able to get Januvia due to the price.Patient states she is no longer taking Uptravi due to side  effects she was having:"upset stomach".  Patient is due for next adherence delivery on: 05//16/2022. Called patient and reviewed medications and coordinated delivery.  This delivery to include:  Atorvastatin 80 mg one tablet daily -Breakfast   Venlafaxine 75 mg one tablet daily -Breakfast   Toujeo U300 Inject 30units into the skin daily  Omeprazole 40 mgone capsuletwice Daily - Breakfast, bedtime  BD Pen Needle Nano - Daily  Hydrocodone-acetaminophen  7.5-325 mg PRN.  Onetouch ultra test strip   Patient declined the following medications   Amiodarone 200 mg 1 tablet daily (Adequate supply- recevie 30 day supply from [facility] pharmacy on 12/09/2020).  Eliquis 5 mg 1 tablet 2 time daily (Adequate supply - receive 90 day supply from [facility] pharmacy on 04/242022).  Metoprolol 0.5 mg 1 tablet daily (Adequate supply - receive 60 day supply from [facility] on 12/20/2020).  Torsemide 20 mg PRN (Adequate supply)  Losartan 25 mg  One tablet daily at bedtime (Place on hold)  Jardiance 10 mgone tabletDaily- Breakfast (Place on hold)  Malvin Johns - (patient states her provider told her to postpone until October due to her  hip pain ).  Trulicity 3.67QV Inject 0.75 mg into the skin once a week (patient receiving Trulicity  from [employer])  Januvia 25 mg 1 tablet daily (Adequate supply - receive 30 day supply from upstream pharmacy on 12/17/2020.   Warfarin 4 mg (not taking switch to Eliquis)  Patient needs refills for Venlafaxine 75 mg,Omeprazole 40 mgone capsuletwice Daily  .  Confirmed delivery date of 12/31/2020, advised patient that pharmacy will contact them the morning of delivery.  Patient schedule a telephone visit with the clinical pharmacist on 12/25/2020 at 4:00 pm.  Sent message to scheduler to schedule patient appointment on 12/25/2020.  [place] Pharmacist Assistant [phone]

## 2020-12-24 ENCOUNTER — Telehealth: Payer: Self-pay

## 2020-12-24 ENCOUNTER — Other Ambulatory Visit: Payer: Self-pay

## 2020-12-24 ENCOUNTER — Encounter (HOSPITAL_COMMUNITY): Payer: Self-pay

## 2020-12-24 ENCOUNTER — Ambulatory Visit (HOSPITAL_COMMUNITY)
Admission: RE | Admit: 2020-12-24 | Discharge: 2020-12-24 | Disposition: A | Discharge status: Home / Self Care | Payer: Medicare Other | Source: Ambulatory Visit | Attending: Cardiology | Admitting: Cardiology

## 2020-12-24 VITALS — BP 140/78 | HR 59 | Wt 244.0 lb

## 2020-12-24 DIAGNOSIS — I4892 Unspecified atrial flutter: Secondary | ICD-10-CM | POA: Diagnosis not present

## 2020-12-24 DIAGNOSIS — Z8673 Personal history of transient ischemic attack (TIA), and cerebral infarction without residual deficits: Secondary | ICD-10-CM | POA: Insufficient documentation

## 2020-12-24 DIAGNOSIS — N184 Chronic kidney disease, stage 4 (severe): Secondary | ICD-10-CM | POA: Insufficient documentation

## 2020-12-24 DIAGNOSIS — E1151 Type 2 diabetes mellitus with diabetic peripheral angiopathy without gangrene: Secondary | ICD-10-CM | POA: Diagnosis not present

## 2020-12-24 DIAGNOSIS — Z86711 Personal history of pulmonary embolism: Secondary | ICD-10-CM | POA: Insufficient documentation

## 2020-12-24 DIAGNOSIS — Z79899 Other long term (current) drug therapy: Secondary | ICD-10-CM | POA: Insufficient documentation

## 2020-12-24 DIAGNOSIS — I272 Pulmonary hypertension, unspecified: Secondary | ICD-10-CM

## 2020-12-24 DIAGNOSIS — I5032 Chronic diastolic (congestive) heart failure: Secondary | ICD-10-CM

## 2020-12-24 DIAGNOSIS — Z8249 Family history of ischemic heart disease and other diseases of the circulatory system: Secondary | ICD-10-CM | POA: Diagnosis not present

## 2020-12-24 DIAGNOSIS — I2510 Atherosclerotic heart disease of native coronary artery without angina pectoris: Secondary | ICD-10-CM | POA: Insufficient documentation

## 2020-12-24 DIAGNOSIS — R001 Bradycardia, unspecified: Secondary | ICD-10-CM | POA: Insufficient documentation

## 2020-12-24 DIAGNOSIS — Z7722 Contact with and (suspected) exposure to environmental tobacco smoke (acute) (chronic): Secondary | ICD-10-CM | POA: Diagnosis not present

## 2020-12-24 DIAGNOSIS — E1122 Type 2 diabetes mellitus with diabetic chronic kidney disease: Secondary | ICD-10-CM | POA: Diagnosis not present

## 2020-12-24 DIAGNOSIS — Z880 Allergy status to penicillin: Secondary | ICD-10-CM | POA: Diagnosis not present

## 2020-12-24 DIAGNOSIS — Z882 Allergy status to sulfonamides status: Secondary | ICD-10-CM | POA: Insufficient documentation

## 2020-12-24 DIAGNOSIS — M25559 Pain in unspecified hip: Secondary | ICD-10-CM | POA: Insufficient documentation

## 2020-12-24 DIAGNOSIS — Z7901 Long term (current) use of anticoagulants: Secondary | ICD-10-CM | POA: Diagnosis not present

## 2020-12-24 DIAGNOSIS — G4733 Obstructive sleep apnea (adult) (pediatric): Secondary | ICD-10-CM | POA: Insufficient documentation

## 2020-12-24 DIAGNOSIS — M7989 Other specified soft tissue disorders: Secondary | ICD-10-CM | POA: Diagnosis not present

## 2020-12-24 DIAGNOSIS — Z794 Long term (current) use of insulin: Secondary | ICD-10-CM | POA: Insufficient documentation

## 2020-12-24 LAB — COMPREHENSIVE METABOLIC PANEL
ALT: 12 U/L (ref 0–44)
AST: 10 U/L — ABNORMAL LOW (ref 15–41)
Albumin: 3.1 g/dL — ABNORMAL LOW (ref 3.5–5.0)
Alkaline Phosphatase: 70 U/L (ref 38–126)
Anion gap: 5 (ref 5–15)
BUN: 30 mg/dL — ABNORMAL HIGH (ref 8–23)
CO2: 29 mmol/L (ref 22–32)
Calcium: 8.7 mg/dL — ABNORMAL LOW (ref 8.9–10.3)
Chloride: 104 mmol/L (ref 98–111)
Creatinine, Ser: 1.72 mg/dL — ABNORMAL HIGH (ref 0.44–1.00)
GFR, Estimated: 30 mL/min — ABNORMAL LOW (ref >60)
Glucose, Bld: 183 mg/dL — ABNORMAL HIGH (ref 70–99)
Potassium: 4.5 mmol/L (ref 3.5–5.1)
Sodium: 138 mmol/L (ref 135–145)
Total Bilirubin: 0.5 mg/dL (ref 0.3–1.2)
Total Protein: 5.4 g/dL — ABNORMAL LOW (ref 6.5–8.1)

## 2020-12-24 MED ORDER — TORSEMIDE 20 MG PO TABS: 20.0000 mg | ORAL_TABLET | Freq: Every day | ORAL | 11 refills | Status: DC

## 2020-12-24 NOTE — Patient Instructions (Addendum)
CHANGE HOW YOU TAKE TORSEMIDE: Take Torsemide 69m (1 tab) daily  Labs today AND repeat in 1 week We will only contact you if something comes back abnormal or we need to make some changes. Otherwise no news is good news!  Your physician recommends that you schedule a follow-up appointment in: 4 weeks with the APP clinic and 8 weeks with Dr BHaroldine Laws Please call office at [phone] 2 if you have any questions or concerns.   [facility] you and your health needs are our priority. As part of our continuing mission to provide you with exceptional heart care, we have created designated Provider Care Teams. These Care Teams include your primary Cardiologist (physician) and Advanced Practice Providers (APPs- Physician Assistants and Nurse Practitioners) who all work together to provide you with the care you need, when you need it.   You may see any of the following providers on your designated Care Team at your next follow up: .Marland KitchenDr DGlori Bickers. Dr DLoralie Champagne. Dr BVickki Muff. ADarrick Grinder NP . BLyda Jester PArpin. LAudry Riles PharmD   Please be sure to bring in all your medications bottles to every appointment.

## 2020-12-24 NOTE — Progress Notes (Addendum)
Advanced Heart Failure Clinic Note  PCP:  Birdie Sons, MD  Cardiologist:  Dr. Rockey Situ HF Cardiologist: Dr. [name] EP: Dr. Curt Bears   Chief Complaint: post-hospital follow up for Atrial Flutter w/ RVR and f/u for chronic diastolic heart failure and pulmonary htn    History of Present Illness:  [name] is a 80 year old woman with h/o morbid obesity, DM, OSA on CPAP, CKD 4, PAD, CVA, carotid surgery in 2010, pulmonary embolism (2009) and PAH referred by [facility] for further evaluation of her [place].   She moved from [place], [place] in 2021 to be near her family as things were getting harder. Lives by herself in a townhome.  Reports DVT/PE in 2009 and has been on coumadin since. Was diagnosed with PAH in 2016 at [facility] in [place]. Right and left heart cath at that time reported as nonobstructive CAD and severe PAH with PAP 103/19 (51) with PCWP 5. (see results below) Was told she had idiopathic PAH and started on Letairis and cialis. She weighed 255 pounds at the time.   Never smoked but was exposed to 2nd hand smoke. Previouslyon portable oxygen at 5L when she was in [place], but the portable O2 was not covered. Now "goes without oxygen", only at nightthat she wear oxygen with CPAP. Denies h/o CTD.  Evaluated by Dr. [name] for the first time in 11/20: Felt to have primarily WHO Group III PH. Echo and VQ ordered.   Had a syncopal episode in 8/21. CT head showed small 2 mm subdural hematoma. Echocardiogram was obtained, echocardiogram showed EF 55 to 60%, moderate left ventricular hypertrophy, moderately elevated pulmonary artery systolic pressure. Outpatient monitor was ordered and showed atrial flutter continuously (100% burden) with rates ranging from 50 to 133 (average 85 bpm). F/u CT scan 8/23 SDH resolved  OV 11/21 She felt more dizzy since decreasing torsemide, had a fall over Halloween. [name] was started. Had successful DCCV on 07/10/20.  Admitted to [facility]  12/21  after a syncopal episode associated with dizziness prior to passing out. HR was in the 40-50s when EMS arrived. She admits to running out of her [name] recently. Echo 12/21 with EF 60-65%, RV normal, RHC with moderate to severe PAH with high PA pulse pressure and high cardiac output & moderately elevated left-sided pressures. Cardiac MRI showed EF 51%, mild RV dilation, mod pulmonary regurgitation, markedly dilated main pulmonay artery, and RV insertion site LGE. Patient discharged home with Zio patch. Discharge weight 242 lbs.  Clinic visit 2/22, we started Uptravi after [place] in 12/21 showed worsening PAH. Did not tolerate due to HAs, nausea and hip pain. However hip pain has persisted after stopping and found to have degenerative disc disease. Says she is doing ok. Can do ADLs slowly. No change in breathing. No orthopnea, edema or PND. Using CPAP every night. Now taking Letairis 1m and tadalafil 40 daily    12/06/20 she was admitted for symptomatic AFL w/ RVR. INR was supra therapeutic at 8.6 and reversed w/ Vit K.  She underwent successful DCCV on 4/22. EP evaluated and recommended PO amiodarone and outpatient f/u for discussion regarding possible atrial flutter ablation. Given difficulties regulating therapeutic INRs, she was transitioned off of warfarin to Eliquis. Volume status remained stable during hospitalization, however she had AKI w/ SCr bump to 2.4 (baseline 1.8-2.0). torsemide was changed from daily to PRN. Losartan and Jardiance both discontinued. Tadalafil 40 mg and letairis 5 mg also continued. D/c wt was 236 lb.  She returns back to clinic today for f/u. She denies any palpitations since discharge. EKG shows sinus brady, 59 bpm. Her wt is up from 236>>244 lb (previous clinic wt was 232 lb). She has used torsemide ~2-3 times since discharge. She is followed by [facility]. Denies any significant change in respiratory status, remains on 3L/[place] which is her baseline. BP  midly elevated 140/78.   She has f/u w/ Dr. Curt Bears next week to discuss ablation. Reports full compliance w/ Eliquis. No abnormal bleeding.     08/06/20 Echo  EF 60-65% RV normal   cMRI 08/07/20: 1. Motion artifact throughout exam affects quantification of volumes 2. No evidence of shunt with Qp/Qs = 0.94 3. Normal LV size and systolic function (EF 21%) 4. Mild RV dilatation with normal systolic function (EF 30%) 5. Moderate pulmonary regurgitation (regurgitant fraction 23%) 6. RV insertion site late gadolinium enhancement, which is a nonspecific finding often seen in setting of pulmonary hypertension 7. Markedly dilated main pulmonary artery, measuring 33m x 459m8. Mild mitral regurgitation (regurgitant fraction 18%), mild aortic regurgitation (regurgitant fraction 15%), and mild tricuspid regurgitation (regurgitant fraction 12%)  VQ 07/07/19: No PE  RHC  07/29/19 RA = 8 RV = 81/14 PA =  80/18 (42) PCW =17 Fick cardiac output/index = 9.25/4.25 PVR = 2.70 WU FA sat = 98% PA sat = 75%, 75% High SVC 72%  RHC 9/21 RA = 8 RV = 65/7 PA = 61/19 (34) PCW = 19 (v= 27) Fick cardiac output/index = 8.1/3.8 PVR = 1.2 WU Ao sat = 99% PA sat = 75%, 78% SVC = 70%  RHC 12/21 RA = 11 RV = 91/14 PA = 93/20 (47) PCW = 21 Fick cardiac output/index = 8.0/3.7 PVR = 3.1 WU FA sat = 99% PA sat = 72%, 73% High SVC = 72% PAPi = 6.6   PFTs 09/28/19  FEV1 1.92 (57%) FVC 2.57 (58%) DLCO 61%  Previous cardiac studies:  12/16/2014 right and left heart catheterization with nitric oxide inhalation, Pressure data resting: -Mean right atrial pressure 2 Right ventricule 10865/7nd diastolic -Pulmonary artery 103/19, mean 51. Pulmonary artery wedge mean 5 -Ascending thoracic aorta 111/53, mean 75. -Fick cardiac output 4.2 L. -Pulmonary resistance 12.25 Woods units. With nitric oxide inhalation -Pulmonary artery 89/29, mean 42. -Right ventricle 9184/6nd  diastolic. -Right atrial 2. -Fick cardiac output 4.94 L. -Pulmonary resistance 15.91 Woods units.  Carotid [place]/22:    Past Medical History:  Diagnosis Date  . A-fib ([facility]  . Anemia   . CHF (congestive heart failure) ([facility]  . Chronic kidney disease   . Diabetes mellitus without complication ([facility]  . Hypertension   . Pulmonary hypertension ([facility]CK[place]  . Syncope 07/2020   Past Surgical History:  Procedure Laterality Date  . ABDOMINAL HYSTERECTOMY  1997  . ABDOMINAL HYSTERECTOMY    . CARDIOVERSION N/A 07/10/2020   Procedure: CARDIOVERSION;  Surgeon: BeJolaine ArtistMD;  [facility];  Service: Cardiovascular;  Laterality: N/A;  . CARDIOVERSION N/A 12/07/2020   Procedure: CARDIOVERSION;  Surgeon: BeJolaine ArtistMD;  [facility];  Service: Cardiovascular;  Laterality: N/A;  . cartiod artery surgery  2010   per patient   . CHOLECYSTECTOMY  10/19/2009  . masectomy    . MASTECTOMY  1994  . RIGHT HEART CATH N/A 07/29/2019   Procedure: RIGHT HEART CATH;  Surgeon: BeJolaine ArtistMD;  [facility];  Service: Cardiovascular;  Laterality:  N/A;  . RIGHT HEART CATH N/A 05/11/2020   Procedure: RIGHT HEART CATH;  Surgeon: Jolaine Artist, MD;  Location: [place] [facility] LAB;  Service: Cardiovascular;  Laterality: N/A;  . RIGHT HEART CATH N/A 08/07/2020   Procedure: RIGHT HEART CATH;  Surgeon: Jolaine Artist, MD;  Location: [place] [facility];  Service: Cardiovascular;  Laterality: N/A;    Current Outpatient Medications  Medication Sig Dispense Refill  . acetaminophen (TYLENOL) 650 MG CR tablet Take 650-1,300 mg by mouth every 8 (eight) hours as needed for pain.    [name] ambrisentan (LETAIRIS) 5 MG tablet Take 5 mg by mouth at bedtime.    [name] amiodarone (PACERONE) 200 MG tablet Take 1 tablet (200 mg total) by mouth daily. 30 tablet 0  . apixaban (ELIQUIS) 5 MG TABS tablet Take 1 tablet (5 mg total) by mouth 2 (two) times daily. 180  tablet 0  . atorvastatin (LIPITOR) 80 MG tablet Take 1 tablet (80 mg total) by mouth daily. 90 tablet 2  . BD PEN NEEDLE NANO U/F 32G X 4 MM MISC USE TO INJECT ONCE DAILY AS DIRECTED 100 each 2  . Cholecalciferol (VITAMIN D3) 50 MCG (2000 UT) TABS Take 2,000 Units by mouth in the morning and at bedtime.    . docusate sodium (COLACE) 100 MG capsule Take 100 mg by mouth daily as needed (constipation.).     [name] Dulaglutide (TRULICITY) 7.25 DG/6.4QI SOPN Inject 0.75 mg into the skin once a week. 2 mL 1  . insulin glargine, 1 Unit Dial, (TOUJEO) 300 UNIT/ML Solostar Pen Inject 30 Units into the skin at bedtime. 1.5 mL 0  . metoprolol succinate (TOPROL-XL) 25 MG 24 hr tablet Take 0.5 tablets (12.5 mg total) by mouth in the morning. 30 tablet 0  . omeprazole (PRILOSEC) 40 MG capsule Take 1 capsule (40 mg total) by mouth in the morning and at bedtime. 90 capsule 1  . ONETOUCH ULTRA test strip USE AS DIRECTED 100 each 1  . PRESCRIPTION MEDICATION CPAP: At bedtime    . sitaGLIPtin (JANUVIA) 25 MG tablet Take 25 mg by mouth daily.    . tadalafil, PAH, (ADCIRCA) 20 MG tablet Take 2 tablets (40 mg total) by mouth at bedtime. 180 tablet 3  . torsemide (DEMADEX) 20 MG tablet Take 1 tablet (20 mg total) by mouth daily as needed (Take Torsemide 20 mg as needed daily if weight >240 lbs which is your normal dry weight.  Take if increase of weight 3 lbs per day or 5 lbs per week.). 30 tablet 0  . venlafaxine (EFFEXOR) 75 MG tablet Take 1 tablet (75 mg total) by mouth daily. 90 tablet 0  . vitamin B-12 (CYANOCOBALAMIN) 1000 MCG tablet Take 1,000 mcg by mouth in the morning and at bedtime.     No current facility-administered medications for this encounter.    Allergies:   Penicillins, Sulfa antibiotics, and Uptravi [selexipag]   Social History:  The patient  reports that she has never smoked. She has never used smokeless tobacco. She reports that she does not drink alcohol and does not use drugs.   Family History:   The patient's family history includes CAD in her maternal grandmother and mother; Heart disease in her father.   ROS:  Please see the history of present illness.   All other systems are personally reviewed and negative.   Vitals:   12/24/20 0852  BP: 140/78  Pulse: (!) 59  SpO2: 97%  Weight: 110.7 kg (244  lb)    Wt Readings from Last 3 Encounters:  12/24/20 110.7 kg (244 lb)  12/14/20 109.3 kg (241 lb)  12/09/20 107.3 kg (236 lb 8 oz)   EKG: sinus bradycardia 59 bpm  PHYSICAL EXAM: General:  Well appearing, moderately obese on supp O2 but normal WOB HEENT: normal Neck: supple. no JVD. Carotids 2+ bilat; no bruits. No lymphadenopathy or thyromegaly appreciated. Cor: PMI nondisplaced. Regular rate & rhythm. No rubs, gallops or murmurs. Lungs: clear Abdomen: obese, soft, nontender, nondistended. No hepatosplenomegaly. No bruits or masses. Good bowel sounds. Extremities: no cyanosis, clubbing, rash, edema Neuro: alert & oriented x 3, cranial nerves grossly intact. moves all 4 extremities w/o difficulty. Affect pleasant.   Recent Labs: 01/27/2020: B Natriuretic Peptide 450.6 12/06/2020: ALT 41; TSH 1.283 12/08/2020: Magnesium 2.1 12/09/2020: BUN 57; Creatinine, Ser 2.52; Hemoglobin 9.5; Platelets 164; Potassium 4.1; Sodium 137  Personally reviewed    ASSESSMENT AND PLAN:  1. Paroxysmal AFL - s/p DC-CV 07/10/20 - Recent Admit 4/22 w/ recurrent ALF w/ RVR s/p DCCV - EKG today shows sinus brady 59 bpm  - continue Amiodarone 200 mg daily + Toprol XL 12.5 mg daily. Check TSH and HFTs  - has f/u w/ EP next week to discuss possible ablation. Hopefully can stop amio soon, long term use not ideal w/ WHO group 3 PH  - previously on coumadin, now on Eliquis given prior difficulty regulating INR. Denies abnormal bleeding. Check CBC today    2. Pulmonary HTN (severe) with cor pulmonale - Suspect combination of WHO Group 1 & 3 disease - VQ 07/07/19: No PE - RV function remains normal on  echo despite severe PAH - PFTs with significant restrictive lung physiology -PA pressures 12/21 very high but PA pulsativity and cardiac output much higher than would be suspected in true [place] with pressures of this magnitude.  - cMRI showed no evidence of shunt, Qp/Qs=0.94. - Continue O2 supplementation and CPAP. - Continue tadalafil 40 mg and letairis 5 mg (unable to tolerate higher dose of letairis). - [place] 12/21 with PA pressures back up into 90s with elevated PVR.  - Unable to tolerate selexipag currently. Can consider rechallenge in the future.   - Echo 12/05/20 LVEF 60% RV normal - Has completed pulmonary rehab - will need to monitor closely while on amio. Hopefully will only need short term. Planning possible ablation for definitive tx of ALF. EP following   3. Chronic Diastolic HF - Stable NYHA III symptoms but volume status trending up after diuretic reduction - Change back to daily torsemide use, 20 mg daily  - Check BMP today and again in 7 days  - Currently off losartan and Jardiance due to recent AKI   4. CKD 4 - Baseline Creatinine 1.9 - recent AKI during hospitalization last month w/ SCr bumping to 2.4  - now off losartan and jardiance and torsemide changed to PRN - change torsemide back to daily for volume control. Keep off losartan  - BMP today and again in 7 days - she is followed by [facility]  5. Nonobstructive CAD - No s/s angina - Continue statin.   - Off ASA with Eliquis   6. H/o PE 2009 - VQ 11/20 negative. - c/w Eliquis    7. H/o Syncope - Given severity of PAH, suspicion has been that this was related to [facility] but cardiac outputs and PA pulsativity argue against this somewhat unless preload dropped for some reason. She was bradycardic when event occurred, may also be  contributing factor.   - No further syncopal episodes or dizziness since  - Zio 12/21. SR with frequent runs of SVT (1547 runs - longest 58 seconds)/ No high-grade arrhythmias or pauses - EKG  today w/ only mild bradycardia, 59 bpm   F/u w/ APP in 4 weeks to reassess volume status. F/u w/ Dr. [name] in 8 weeks   Signed, Lyda Jester, PA-C  12/24/2020 8:59 AM  Advanced Heart Failure [facility] [place] [phone] (office) [phone] (fax)

## 2020-12-24 NOTE — Progress Notes (Signed)
Left a  Voice message   to confirmed patient telephone appointment on 12/25/2020 for CCM at 4:00 pm with Junius Argyle the Clinical pharmacist.    [place] Pharmacist Assistant [phone]

## 2020-12-25 ENCOUNTER — Ambulatory Visit: Payer: Medicare Other

## 2020-12-25 DIAGNOSIS — Z794 Long term (current) use of insulin: Secondary | ICD-10-CM

## 2020-12-25 DIAGNOSIS — K219 Gastro-esophageal reflux disease without esophagitis: Secondary | ICD-10-CM

## 2020-12-25 DIAGNOSIS — F32A Depression, unspecified: Secondary | ICD-10-CM

## 2020-12-25 DIAGNOSIS — N184 Chronic kidney disease, stage 4 (severe): Secondary | ICD-10-CM

## 2020-12-25 MED ORDER — VENLAFAXINE HCL 75 MG PO TABS: 75.0000 mg | ORAL_TABLET | Freq: Every day | ORAL | 0 refills | Status: DC

## 2020-12-25 MED ORDER — OMEPRAZOLE 40 MG PO CPDR: 40.0000 mg | DELAYED_RELEASE_CAPSULE | Freq: Two times a day (BID) | ORAL | 1 refills | Status: DC

## 2020-12-25 MED ORDER — INSULIN GLARGINE (1 UNIT DIAL) 300 UNIT/ML ~~LOC~~ SOPN: 35.0000 [IU] | PEN_INJECTOR | Freq: Every day | SUBCUTANEOUS | 1 refills | Status: DC

## 2020-12-25 NOTE — Progress Notes (Signed)
Chronic Care Management Pharmacy Note  01/02/2021 Name:  Ann Flynn MRN:  [id number] DOB:  [birth year]  Subjective: Ann Flynn is an 80 y.o. year old female who is a primary patient of Fisher, Kirstie Peri, MD.  The CCM team was consulted for assistance with disease management and care coordination needs.    Engaged with patient by telephone for follow up visit in response to provider referral for pharmacy case management and/or care coordination services.   Consent to Services:  The patient was given information about Chronic Care Management services, agreed to services, and gave verbal consent prior to initiation of services.  Please see initial visit note for detailed documentation.   Patient Care Team: Birdie Sons, MD as PCP - General (Family Medicine) Bensimhon, Shaune Pascal, MD as PCP - Advanced Heart Failure (Cardiology) Constance Haw, MD as PCP - Electrophysiology (Cardiology) Bensimhon, Shaune Pascal, MD as PCP - Cardiology (Cardiology) Germaine Pomfret, [facility] as Pharmacist (Pharmacist) Murlean Iba, MD (Nephrology)  Recent office visits: 12/14/2020 Dr.Fisher MD (PCP)- Omeprazole 40 mg change from daily to 2 times daily 11/27/2020 Dr Caryn Section MD (PCP) - Started prednisone 20 mg one tablet twice a day for 7 days, Change Hydrocodone-Acetaminophen from 5-325 mg to 7.5-325 mg PRN.  Recent consult visits: 12/05/2020 Dr [name] MD (Cardiology)  11/26/2020 Audry Riles RPH-CPP (Heart Failure Clinic) decreased Uptravi to 400 mcg BID.  Hospital visits: Medication Reconciliation was completed by comparing discharge summary, patient's EMR and Pharmacy list, and upon discussion with patient.   Admitted to the hospital on 12/06/2020 due to Atrial flutter with rapid ventricular response. Discharge date was 12/09/2020. Discharged from [facility]?Medications Started at [facility] Discharge:?? -started Amiodarone HCI 200 mg daily  -Eliquis 5 mg BID    Medication Changes at Hospital Discharge: -Changed Coumadin to Eliquis 5 mg Metoprolol change to 0.5 tablet once daily Change torsemide 20 mg to prn Change Lantus dose from 45 units to 30 units nightly Medications Discontinued at Hospital Discharge: -Losartan on hold  Jardiance on hold due to AKI   Medications that remain the same after Hospital Discharge:??  -All other medications will remain the same.     Admitted to the hospital on 11/23/2020 due to Sciatic nerve pain. Discharge date was 11/23/2020. Discharged from [facility] [place]?Medications Started at [facility] Discharge:?? -started Hydrocodone-acetaminophen 5-325 mg PRN   Medication Changes at Hospital Discharge: -Changed None   Medications Discontinued at Hospital Discharge: -Stopped None    Medications that remain the same after Hospital Discharge:??  -All other medications will remain the same.  Objective:  Lab Results  Component Value Date   CREATININE 2.01 (H) 12/31/2020   BUN 33 (H) 12/31/2020   GFRNONAA 25 (L) 12/31/2020   GFRAA 29 (L) 07/24/2020   NA 139 12/31/2020   K 4.2 12/31/2020   CALCIUM 8.8 (L) 12/31/2020   CO2 28 12/31/2020   GLUCOSE 245 (H) 12/31/2020    Lab Results  Component Value Date/Time   HGBA1C 7.6 (A) 10/23/2020 10:56 AM   HGBA1C 7.0 (A) 07/24/2020 11:19 AM   HGBA1C 7.9 (H) 03/24/2020 06:15 AM   HGBA1C 7.2 (H) 03/28/2019 04:11 PM    Last diabetic Eye exam:  Lab Results  Component Value Date/Time   HMDIABEYEEXA Retinopathy (A) 09/28/2019 12:00 AM    Last diabetic Foot exam: No results found for: HMDIABFOOTEX   Lab Results  Component Value Date   CHOL 104  01/18/2020   HDL 43 01/18/2020   LDLCALC 46 01/18/2020   TRIG 68 01/18/2020   CHOLHDL 2.4 01/18/2020    Hepatic Function Latest Ref Rng & Units 12/24/2020 12/06/2020 07/24/2020  Total Protein 6.5 - 8.1 g/dL 5.4(L) 5.7(L) 5.8(L)  Albumin 3.5 - 5.0 g/dL 3.1(L) 3.3(L) 3.8  AST 15 - 41 U/L 10(L) 26 8   ALT 0 - 44 U/L 12 41 8  Alk Phosphatase 38 - 126 U/L 70 54 79  Total Bilirubin 0.3 - 1.2 mg/dL 0.5 0.6 0.2    Lab Results  Component Value Date/Time   TSH 1.283 12/06/2020 05:09 PM    CBC Latest Ref Rng & Units 12/09/2020 12/08/2020 12/07/2020  WBC 4.0 - 10.5 K/uL 9.6 10.9(H) 16.6(H)  Hemoglobin 12.0 - 15.0 g/dL 9.5(L) 10.3(L) 10.9(L)  Hematocrit 36.0 - 46.0 % 30.2(L) 33.2(L) 35.0(L)  Platelets 150 - 400 K/uL 164 178 220    No results found for: VD25OH  Clinical ASCVD: Yes  The ASCVD Risk score Mikey Bussing DC Jr., et al., 2013) failed to calculate for the following reasons:   The patient has a prior MI or stroke diagnosis    Depression screen [facility] 2/9 07/24/2020 02/14/2020 10/11/2019  Decreased Interest _0 Down, Depressed, Hopeless 1 0 1  PHQ - 2 Score _1 Altered sleeping 1 0 1  Tired, decreased energy _2 Change in appetite _3 Feeling bad or failure about yourself  1 0 1  Trouble concentrating 0 0 0  Moving slowly or fidgety/restless 0 0 0  Suicidal thoughts 0 0 0  PHQ-9 Score _4 Difficult doing work/chores Somewhat difficult Not difficult at all Somewhat difficult  Some recent data might be hidden    Social History   Tobacco Use  Smoking Status Never Smoker  Smokeless Tobacco Never Used   BP Readings from Last 3 Encounters:  12/27/20 (!) 144/72  12/24/20 140/78  12/14/20 120/60   Pulse Readings from Last 3 Encounters:  12/27/20 60  12/24/20 (!) 59  12/14/20 60   Wt Readings from Last 3 Encounters:  12/27/20 240 lb (108.9 kg)  12/24/20 244 lb (110.7 kg)  12/14/20 241 lb (109.3 kg)   BMI Readings from Last 3 Encounters:  12/27/20 39.94 kg/m  12/24/20 40.60 kg/m  12/14/20 40.10 kg/m    Assessment/Interventions: Review of patient past medical history, allergies, medications, health status, including review of consultants reports, laboratory and other test data, was performed as part of comprehensive evaluation and provision of chronic care  management services.   SDOH:  (Social Determinants of Health) assessments and interventions performed: Yes SDOH Interventions   Flowsheet Row Most Recent Value  SDOH Interventions   Financial Strain Interventions Other (Comment)  [PAP]     SDOH Screenings   Alcohol Screen: Low Risk   . Last Alcohol Screening Score (AUDIT): 0  Depression (PHQ2-9): Medium Risk  . PHQ-2 Score: 7  Financial Resource Strain: High Risk  . Difficulty of Paying Living Expenses: Hard  Food Insecurity: Not on file  Housing: Not on file  Physical Activity: Not on file  Social Connections: Not on file  Stress: Not on file  Tobacco Use: Low Risk   . Smoking Tobacco Use: Never Smoker  . Smokeless Tobacco Use: Never Used  Transportation Needs: No Transportation Needs  . Lack of Transportation (Medical): No  . Lack of Transportation (Non-Medical): No    CCM Care Plan  Allergies  Allergen Reactions  . Penicillins Swelling    Facial swelling Did it involve swelling of the face/tongue/throat, SOB, or low BP? Yes Did it involve sudden or severe rash/hives, skin peeling, or any reaction on the inside of your mouth or nose? No Did you need to seek medical attention at a hospital or doctor's office? No When did it last happen?10 + years If all above answers are "NO", may proceed with cephalosporin use.   . Sulfa Antibiotics Rash  . Malvin Johns [Selexipag] Other (See Comments)    headaches    Medications Reviewed Today    Reviewed by Constance Haw, MD (Physician) on 12/27/20 at 1437  Med List Status: <None>  Medication Order Taking? Sig Documenting Provider Last Dose Status Informant  acetaminophen (TYLENOL) 650 MG CR tablet [id number] Yes Take 650-1,300 mg by mouth every 8 (eight) hours as needed for pain. [provider] Taking Active Self  ambrisentan (LETAIRIS) 5 MG tablet [id number] Yes Take 5 mg by mouth at bedtime. [provider] Taking Active Self  amiodarone (PACERONE)  200 MG tablet [id number] Yes Take 1 tablet (200 mg total) by mouth daily. Kayleen Memos, DO Taking Active   apixaban (ELIQUIS) 5 MG TABS tablet [id number] Yes Take 1 tablet (5 mg total) by mouth 2 (two) times daily. Kayleen Memos, DO Taking Active   atorvastatin (LIPITOR) 80 MG tablet [id number] Yes Take 1 tablet (80 mg total) by mouth daily. Trinna Post, PA-C Taking Active   Cholecalciferol (VITAMIN D3) 50 MCG (2000 UT) TABS [id number] Yes Take 2,000 Units by mouth in the morning and at bedtime. [provider] Taking Active Self  docusate sodium (COLACE) 100 MG capsule [id number] Yes Take 100 mg by mouth daily as needed (constipation.).  [provider] Taking Active Self  Dulaglutide (TRULICITY) 8.63 OT/7.7NH SOPN [id number] Yes Inject 0.75 mg into the skin once a week. Trinna Post, PA-C Taking Active            Med Note Michaelle Birks, Arlester Marker Nov 22, 2020  3:22 PM) Patient receives through [facility] through December 2022  HYDROcodone-acetaminophen [facility]) 7.5-325 MG tablet [id number] Yes Take 1 tablet by mouth every 6 (six) hours as needed for moderate pain. Birdie Sons, MD Taking Active   HYDROcodone-acetaminophen (NORCO/VICODIN) 5-325 MG tablet [id number] Yes Take 1 tablet by mouth every 6 (six) hours as needed for up to 5 days for moderate pain. Birdie Sons, MD Taking Active   insulin glargine, 1 Unit Dial, (TOUJEO) 300 UNIT/ML Solostar Pen [id number] Yes Inject 35 Units into the skin at bedtime. Birdie Sons, MD Taking Active   Insulin Pen Needle (BD PEN NEEDLE NANO U/F) 32G X 4 MM MISC [id number] Yes Use to inject insulin once daily as directed Birdie Sons, MD Taking Active   levofloxacin (LEVAQUIN) 250 MG tablet [id number]  Take 250 mg by mouth daily. [provider]  Active   metoprolol succinate (TOPROL-XL) 25 MG 24 hr tablet [id number] Yes Take 0.5 tablets (12.5 mg total) by mouth in the morning. Kayleen Memos, DO  Taking Active   omeprazole (PRILOSEC) 40 MG capsule [id number] Yes Take 1 capsule (40 mg total) by mouth in the morning and at bedtime. Birdie Sons, MD Taking Active   [facility] ULTRA test strip [id number] Yes USE AS DIRECTED Paulene Floor Taking Active Self  PRESCRIPTION MEDICATION [id number] Yes CPAP: At bedtime [provider] Taking Active Self  sitaGLIPtin (  JANUVIA) 25 MG tablet [id number] Yes Take 25 mg by mouth daily. [provider] Taking Active   tadalafil, PAH, (ADCIRCA) 20 MG tablet [id number] Yes Take 2 tablets (40 mg total) by mouth at bedtime. Flora Lipps, MD Taking Active Self  torsemide (DEMADEX) 20 MG tablet [id number] Yes Take 1 tablet (20 mg total) by mouth daily. Lyda Jester M, PA-C Taking Active   venlafaxine [facility]) 75 MG tablet [id number] Yes Take 1 tablet (75 mg total) by mouth daily. Birdie Sons, MD Taking Active   vitamin B-12 (CYANOCOBALAMIN) 1000 MCG tablet [id number] Yes Take 1,000 mcg by mouth in the morning and at bedtime. [provider] Taking Active Self          Patient Active Problem List   Diagnosis Date Noted  . DDD (degenerative disc disease), lumbar 12/14/2020  . A-fib ([place]) 12/06/2020  . Atrial flutter with rapid ventricular response ([place])   . DDD (degenerative disc disease), cervical 11/27/2020  . Syncope and collapse 08/05/2020  . Atrial fibrillation, chronic ([place]) 03/24/2020  . Type 2 diabetes mellitus with stage 4 chronic kidney disease ([place]) 03/24/2020  . Syncope 03/24/2020  . Pulmonary hypertension ([place]) 03/24/2020  . Subdural hematoma, acute ([place]) 03/24/2020  . Pulmonary nodules 01/30/2020  . Chronic pulmonary embolism, unspecified pulmonary embolism type, unspecified whether acute cor pulmonale present ([place]) 10/06/2019  . Chronic respiratory failure with hypoxia ([place]) 10/06/2019  . Morbid (severe) obesity due to excess calories ([place]) 10/06/2019  . Hypertension associated with diabetes ([place])  10/06/2019  . Hyperlipidemia associated with type 2 diabetes mellitus ([place]) 10/06/2019  . CKD (chronic kidney disease), stage IV ([place]) 04/01/2019  . Allergic rhinitis 03/28/2019  . Arthritis 03/28/2019  . CHF (congestive heart failure) ([place]) 03/28/2019  . Diabetes mellitus without complication ([place]) 34/28/7681  . Sleep apnea 03/28/2019  . Pulmonary hypertension, primary ([place]) 03/28/2019    Immunization History  Administered Date(s) Administered  . Fluad Quad(high Dose 65+) 07/06/2019, 07/24/2020  . Influenza-Unspecified 06/28/2018  . PFIZER(Purple Top)SARS-COV-2 Vaccination 10/17/2019, 11/07/2019, 05/17/2020    Conditions to be addressed/monitored:  Hypertension, Hyperlipidemia, Diabetes, Atrial Fibrillation, Heart Failure and Chronic Kidney Disease  Care Plan : General Pharmacy (Adult)  Updates made by Germaine Pomfret, RPH since 01/02/2021 12:00 AM    Problem: Hypertension, Hyperlipidemia, Diabetes, Atrial Fibrillation, Heart Failure and Chronic Kidney Disease   Priority: High    Long-Range Goal: Patient-Specific Goal   Start Date: 01/02/2021  Expected End Date: 07/05/2021  This Visit's Progress: On track  Priority: High  Note:   Current Barriers:  . Unable to independently afford treatment regimen . Unable to maintain control of Diabetes  Pharmacist Clinical Goal(s):  [name] Patient will achieve control of diabetes as evidenced by A1c less than 8% through collaboration with PharmD and provider.   Interventions: . 1:1 collaboration with Birdie Sons, MD regarding development and update of comprehensive plan of care as evidenced by provider attestation and co-signature . Inter-disciplinary care team collaboration (see longitudinal plan of care) . Comprehensive medication review performed; medication list updated in electronic medical record  Heart Failure (Goal: manage symptoms and prevent exacerbations) -Controlled -Last ejection fraction: 55-60% (Date: 12/05/20) -HF  type: Diastolic -NYHA Class: III (marked limitation of activity) -AHA HF Stage: C (Heart disease and symptoms present) -Current treatment: . Ambrisentan 5 mg nightly . Metoprolol XL 25 mg 1/2 tablet daily . Tadalafil 20 mg 2 tablets nightly  . Torsemide 20 mg daily -Medications previously tried: NA  -Educated on Proper diuretic administration  and potassium supplementation Importance of blood pressure control -Recommended to continue current medication  Hyperlipidemia: (LDL goal < 70) -Controlled -Current treatment: . Atorvastatin 80 mg daily  -Medications previously tried: NA  -Educated on Importance of limiting foods high in cholesterol; -Recommended to continue current medication  Diabetes (A1c goal <8%) -Uncontrolled -Current medications: . Trulicity 9.03 mg weekly  . Toujeo 30 units nightly . Januvia 25 mg daily  -Medications previously tried: NA   -Current home glucose readings . fasting glucose: 161, 165, 140, 199, 245, 111, 185  . post prandial glucose: NA  -Blood sugars elevated now that patient is back on normal home diet and reduced insulin dose. Will likely need to consider increasing Trulicity and stopping Januvia, will defer for now.  -Denies hypoglycemic/hyperglycemic symptoms -Educated on A1c and blood sugar goals; -INCREASE Toujeo to 35 units nightly   Atrial Fibrillation (Goal: prevent stroke and major bleeding) -Controlled -CHADSVASC: 6 -Current treatment: . Rhythm control: Amiodarone 200 mg daily  . Anticoagulation: Eliquis 5 mg twice daily  -Medications previously tried: NA -Counseled on avoidance of NSAIDs due to increased bleeding risk with anticoagulants; -Recommended to continue current medication Assessed patient finances. Will start PAP for Eliquis  Patient Goals/Self-Care Activities . Patient will:  - check glucose twice daily, document, and provide at future appointments check blood pressure weekly, document, and provide at future  appointments weigh daily, and contact provider if weight gain of greater than 2 pounds in 24 hours  Follow Up Plan: Telephone follow up appointment with care management team member scheduled for:  01/22/2021 at 1:00 PM      Medication Assistance: Application for Eliquis  medication assistance program. in process.  Anticipated assistance start date TBD.  See plan of care for additional detail.  Patient's preferred pharmacy is:  [facility] - [place], [place] - [address] [place] [address] Phone: [phone] Fax: [phone]  [place], [place], [address] [place], [place] [address] Phone: [phone] Fax: [phone]  [facility], [place] AT [place] [address] [place] [address] Phone: [phone] Fax: [phone]  [place], [place] - [address] [place] [address] Phone: [phone] Fax: [phone]  Patient decided to: Utilize [facility] for medication synchronization, packaging and delivery  Care Plan and Follow Up Patient Decision:  Patient agrees to Care Plan and Follow-up.  Plan: Telephone follow up appointment with care management team member scheduled for:  01/22/2021 at 1:00 PM  Junius Argyle, PharmD, [place] [phone]

## 2020-12-26 ENCOUNTER — Telehealth: Payer: Self-pay

## 2020-12-26 ENCOUNTER — Other Ambulatory Visit: Payer: Self-pay | Admitting: Family Medicine

## 2020-12-26 ENCOUNTER — Other Ambulatory Visit: Payer: Self-pay

## 2020-12-26 DIAGNOSIS — M5432 Sciatica, left side: Secondary | ICD-10-CM

## 2020-12-26 MED ORDER — BD PEN NEEDLE NANO U/F 32G X 4 MM MISC
2 refills | Status: AC
Start: 2020-12-26 — End: ?

## 2020-12-26 MED ORDER — HYDROCODONE-ACETAMINOPHEN 7.5-325 MG PO TABS: 1.0000 | ORAL_TABLET | Freq: Four times a day (QID) | ORAL | 0 refills | Status: DC | PRN

## 2020-12-26 NOTE — Telephone Encounter (Signed)
Ann Flynn, [facility] sent to [facility]re[facility] Heads Nurse Patient is requesting a refill of her hydrocodone-APAP. Can you please send a request to Dr. Caryn Section to see if he can refill that medication?   Thanks,  Junius Argyle, PharmD, [facility]  [phone]

## 2020-12-26 NOTE — Addendum Note (Signed)
Addended by: Daron Offer A on: 12/26/2020 02:00 PM   Modules accepted: Orders

## 2020-12-26 NOTE — Telephone Encounter (Signed)
What pharmacy?

## 2020-12-26 NOTE — Telephone Encounter (Signed)
Patient would like Rx sent to [facility].

## 2020-12-26 NOTE — Telephone Encounter (Signed)
Opened in error

## 2020-12-26 NOTE — Telephone Encounter (Signed)
See message below from [facility] (pharmacist). Patient is asking for a refill for Hydrocodone-APAP. This medication is not on her active medication list. Please advise on refill request.

## 2020-12-26 NOTE — Telephone Encounter (Signed)
Requested medication (s) are due for refill today:  Short term med   5 day course completed  On Med profile: No  Last refill: 12/14/20  #20  0 refills  Future visit scheduled    01/22/21 with pharmacist.  Notes to clinic:not delegated  Requested Prescriptions  Pending Prescriptions Disp Refills   HYDROcodone-acetaminophen (NORCO/VICODIN) 5-325 MG tablet [Pharmacy Med Name: hydrocodone 5 mg-acetaminophen 325 mg tablet] 20 tablet 0    Sig: Take 1 tablet by mouth every 6 (six) hours as needed for up to 5 days for moderate pain.      Not Delegated - Analgesics:  Opioid Agonist Combinations Failed - 12/26/2020  1:52 PM      Failed - This refill cannot be delegated      Failed - Urine Drug Screen completed in last 360 days      Passed - Valid encounter within last 6 months    Recent Outpatient Visits           1 week ago DDD (degenerative disc disease), cervical   [facility] Birdie Sons, MD   4 weeks ago Sciatica, left side   [facility] Birdie Sons, MD   2 months ago Diabetes mellitus without complication [facility])   [place], [place], PA-C   4 months ago Syncope, unspecified syncope type   [place], [place], PA-C   5 months ago Type 2 diabetes mellitus with stage 4 chronic kidney disease, with long-term current use of insulin [facility])   [place], Wendee Beavers, [place]       Future Appointments             Tomorrow [place], Ocie Doyne, MD [place], [facility]   In 1 month Fisher, Kirstie Peri, MD [facility], [facility]   In 2 months Fisher, Kirstie Peri, MD [facility], [place]

## 2020-12-26 NOTE — Progress Notes (Addendum)
Reach out to [facility] to request transfers on 12/26/2020  of new medications metoprolol 25 mg, Eliquis 5 mg, and Amiodarone 200 mg to go to [facility]. Per [facility] there are no refills for metoprolol,Eliquis, or Amiodarone.Notifed clinical Pharmacist.   Completed acute form for Amiodarone to be deliver on 01/08/2021 sent to clinical pharmacist for review.  Reach out to patient cardiologist to request refill for [facility].  [place] Pharmacist Assistant [phone]

## 2020-12-27 ENCOUNTER — Other Ambulatory Visit: Payer: Self-pay

## 2020-12-27 ENCOUNTER — Other Ambulatory Visit: Payer: Self-pay | Admitting: Family Medicine

## 2020-12-27 ENCOUNTER — Encounter: Payer: Self-pay | Admitting: Cardiology

## 2020-12-27 ENCOUNTER — Telehealth: Payer: Self-pay

## 2020-12-27 ENCOUNTER — Ambulatory Visit: Payer: Medicare Other | Admitting: Cardiology

## 2020-12-27 VITALS — BP 144/72 | HR 60 | Ht 65.0 in | Wt 240.0 lb

## 2020-12-27 DIAGNOSIS — Z01818 Encounter for other preprocedural examination: Secondary | ICD-10-CM | POA: Diagnosis not present

## 2020-12-27 DIAGNOSIS — I483 Typical atrial flutter: Secondary | ICD-10-CM | POA: Diagnosis not present

## 2020-12-27 DIAGNOSIS — Z01812 Encounter for preprocedural laboratory examination: Secondary | ICD-10-CM

## 2020-12-27 MED ORDER — HYDROCODONE-ACETAMINOPHEN 5-325 MG PO TABS: 1.0000 | ORAL_TABLET | Freq: Four times a day (QID) | ORAL | 0 refills | Status: DC | PRN

## 2020-12-27 MED ORDER — HYDROCODONE-ACETAMINOPHEN 5-325 MG PO TABS: 1.0000 | ORAL_TABLET | Freq: Four times a day (QID) | ORAL | 0 refills | Status: AC | PRN

## 2020-12-27 NOTE — Progress Notes (Signed)
 Electrophysiology Office Note   Date:  12/27/2020   ID:  Ann Flynn, DOB 01/27/1941, MRN [id number]  PCP:  Fisher, Donald E, MD  Cardiologist:  Bensimhon Primary Electrophysiologist:  Dossie Swor Martin Enza Shone, MD    Chief Complaint: atrial flutter   History of Present Illness: Ann Flynn is a 79 y.o. female who is being seen today for the evaluation of atrial flutter at the request of Fisher, Donald E, MD. Presenting today for electrophysiology evaluation.  She has a history sending for morbid obesity, type 2 diabetes, OSA on CPAP, CKD stage IV, PAD, CVA, carotid surgery in 2010, PE in 2009, pulmonary artery hypertension.  She was admitted to the hospital 12/06/2020 with increasing shortness of breath.  She was found to be in atrial flutter by the heart failure clinic.  She was thus referred back to the hospital.    Today, she denies Ribeiro symptoms of palpitations, chest pain, shortness of breath, orthopnea, PND, lower extremity edema, claudication, dizziness, presyncope, syncope, bleeding, or neurologic sequela. The patient is tolerating medications without difficulties.  Since being discharged from the hospital she has done well.  She has no chest pain.  Her shortness of breath has remained stable.  She is ready to have her atrial flutter ablated.   Past Medical History:  Diagnosis Date  . A-fib (HCC)   . Anemia   . CHF (congestive heart failure) (HCC)   . Chronic kidney disease   . Diabetes mellitus without complication (HCC)   . Hypertension   . Pulmonary hypertension (HCC)   . Syncope 07/2020   Past Surgical History:  Procedure Laterality Date  . ABDOMINAL HYSTERECTOMY  1997  . ABDOMINAL HYSTERECTOMY    . CARDIOVERSION N/A 07/10/2020   Procedure: CARDIOVERSION;  Surgeon: Bensimhon, Daniel R, MD;  Location: [facility];  Service: Cardiovascular;  Laterality: N/A;  . CARDIOVERSION N/A 12/07/2020   Procedure: CARDIOVERSION;  Surgeon: Bensimhon, Daniel R, MD;  Location: [facility]  ENDOSCOPY;  Service: Cardiovascular;  Laterality: N/A;  . cartiod artery surgery  2010   per patient   . CHOLECYSTECTOMY  10/19/2009  . masectomy    . MASTECTOMY  1994  . RIGHT HEART CATH N/A 07/29/2019   Procedure: RIGHT HEART CATH;  Surgeon: Bensimhon, Daniel R, MD;  Location: [facility] INVASIVE CV LAB;  Service: Cardiovascular;  Laterality: N/A;  . RIGHT HEART CATH N/A 05/11/2020   Procedure: RIGHT HEART CATH;  Surgeon: Bensimhon, Daniel R, MD;  Location: [facility] INVASIVE CV LAB;  Service: Cardiovascular;  Laterality: N/A;  . RIGHT HEART CATH N/A 08/07/2020   Procedure: RIGHT HEART CATH;  Surgeon: Bensimhon, Daniel R, MD;  Location: [facility]AS[facility] CV LAB;  Service: Cardiovascular;  Laterality: N/A;     Current Outpatient Medications  Medication Sig Dispense Refill  . acetaminophen (TYLENOL) 650 MG CR tablet Take 650-1,300 mg by mouth every 8 (eight) hours as needed for pain.    . ambrisentan (LETAIRIS) 5 MG tablet Take 5 mg by mouth at bedtime.    . amiodarone (PACERONE) 200 MG tablet Take 1 tablet (200 mg total) by mouth daily. 30 tablet 0  . apixaban (ELIQUIS) 5 MG TABS tablet Take 1 tablet (5 mg total) by mouth 2 (two) times daily. 180 tablet 0  . atorvastatin (LIPITOR) 80 MG tablet Take 1 tablet (80 mg total) by mouth daily. 90 tablet 2  . Cholecalciferol (VITAMIN D3) 50 MCG (2000 UT) TABS Take 2,000 Units by mouth in the morning and at bedtime.    . docusate   sodium (COLACE) 100 MG capsule Take 100 mg by mouth daily as needed (constipation.).     . Dulaglutide (TRULICITY) 0.75 MG/0.5ML SOPN Inject 0.75 mg into the skin once a week. 2 mL 1  . HYDROcodone-acetaminophen (NORCO) 7.5-325 MG tablet Take 1 tablet by mouth every 6 (six) hours as needed for moderate pain. 30 tablet 0  . HYDROcodone-acetaminophen (NORCO/VICODIN) 5-325 MG tablet Take 1 tablet by mouth every 6 (six) hours as needed for up to 5 days for moderate pain. 30 tablet 0  . insulin glargine, 1 Unit Dial, (TOUJEO) 300 UNIT/ML Solostar  Pen Inject 35 Units into the skin at bedtime. 4.5 mL 1  . Insulin Pen Needle (BD PEN NEEDLE NANO U/F) 32G X 4 MM MISC Use to inject insulin once daily as directed 100 each 2  . metoprolol succinate (TOPROL-XL) 25 MG 24 hr tablet Take 0.5 tablets (12.5 mg total) by mouth in the morning. 30 tablet 0  . omeprazole (PRILOSEC) 40 MG capsule Take 1 capsule (40 mg total) by mouth in the morning and at bedtime. 90 capsule 1  . ONETOUCH ULTRA test strip USE AS DIRECTED 100 each 1  . PRESCRIPTION MEDICATION CPAP: At bedtime    . sitaGLIPtin (JANUVIA) 25 MG tablet Take 25 mg by mouth daily.    . tadalafil, PAH, (ADCIRCA) 20 MG tablet Take 2 tablets (40 mg total) by mouth at bedtime. 180 tablet 3  . torsemide (DEMADEX) 20 MG tablet Take 1 tablet (20 mg total) by mouth daily. 30 tablet 11  . venlafaxine (EFFEXOR) 75 MG tablet Take 1 tablet (75 mg total) by mouth daily. 90 tablet 0  . vitamin B-12 (CYANOCOBALAMIN) 1000 MCG tablet Take 1,000 mcg by mouth in the morning and at bedtime.    . levofloxacin (LEVAQUIN) 250 MG tablet Take 250 mg by mouth daily.     No current facility-administered medications for this visit.    Allergies:   Penicillins, Sulfa antibiotics, and Uptravi [selexipag]   Social History:  The patient  reports that she has never smoked. She has never used smokeless tobacco. She reports that she does not drink alcohol and does not use drugs.   Family History:  The patient's family history includes CAD in her maternal grandmother and mother; Heart disease in her father.    ROS:  Please see the history of present illness.   Otherwise, review of systems is positive for none.   All other systems are reviewed and negative.    PHYSICAL EXAM: VS:  BP (!) 144/72   Pulse 60   Ht 5' 5" (1.651 m)   Wt 240 lb (108.9 kg)   BMI 39.94 kg/m  , BMI Body mass index is 39.94 kg/m. GEN: Well nourished, well developed, in no acute distress  HEENT: normal  Neck: no JVD, carotid bruits, or  masses Cardiac: RRR; no murmurs, rubs, or gallops,no edema  Respiratory:  clear to auscultation bilaterally, normal work of breathing GI: soft, nontender, nondistended, + BS MS: no deformity or atrophy  Skin: warm and dry Neuro:  Strength and sensation are intact Psych: euthymic mood, full affect  EKG:  EKG is not ordered today. Personal review of the ekg ordered 5*9/22 shows sinus rhythm rate 59, T wave  Recent Labs: 01/27/2020: B Natriuretic Peptide 450.6 12/06/2020: TSH 1.283 12/08/2020: Magnesium 2.1 12/09/2020: Hemoglobin 9.5; Platelets 164 12/24/2020: ALT 12; BUN 30; Creatinine, Ser 1.72; Potassium 4.5; Sodium 138    Lipid Panel     Component Value Date/Time     CHOL 104 01/18/2020 1155   TRIG 68 01/18/2020 1155   HDL 43 01/18/2020 1155   CHOLHDL 2.4 01/18/2020 1155   LDLCALC 46 01/18/2020 1155     Wt Readings from Last 3 Encounters:  12/27/20 240 lb (108.9 kg)  12/24/20 244 lb (110.7 kg)  12/14/20 241 lb (109.3 kg)      Other studies Reviewed: Additional studies/ records that were reviewed today include: TTE 12/05/20  Review of the above records today demonstrates:  1. Left ventricular ejection fraction, by estimation, is 55 to 60%. The  left ventricle has normal function. The left ventricle has no regional  wall motion abnormalities. There is mild left ventricular hypertrophy.  Left ventricular diastolic parameters  are consistent with Grade II diastolic dysfunction (pseudonormalization).  2. Right ventricular systolic function is normal. The right ventricular  size is normal.  3. Left atrial size was mild to moderately dilated.  4. Right atrial size was mildly dilated.  5. The mitral valve is normal in structure. Trivial mitral valve  regurgitation. Mild mitral stenosis. Moderate mitral annular  calcification.  6. The aortic valve is normal in structure. There is moderate  calcification of the aortic valve. Aortic valve regurgitation is not  visualized.  Mild to moderate aortic valve sclerosis/calcification is  present, without any evidence of aortic stenosis.  7. Aortic dilatation noted. There is mild dilatation of the aortic root,  measuring 44 mm.  8. The inferior vena cava is normal in size with greater than 50%  respiratory variability, suggesting right atrial pressure of 3 mmHg.    ASSESSMENT AND PLAN:  1.  Typical appearing atrial flutter: Echo 12/05/2020 with a normal ejection fraction with severe pulmonary hypertension.  She is a poor candidate for Tikosyn.  She has been started on amiodarone.  High risk medication monitoring.  Also on Eliquis.  CHA2DS2-VASc of at least 6.  She would benefit from ablation to get off of her amiodarone.  Risk and benefits were discussed include bleeding, tamponade, heart block, stroke, damage to chest organs.  She understands these risks and has agreed to the procedure.  Case discussed with primary cardiology.  2.  Obstructive sleep apnea: CPAP compliance encouraged  3 pulmonary hypertension: Followed by heart failure team.  Current medicines are reviewed at length with the patient today.   The patient does not have concerns regarding her medicines.  The following changes were made today:  none  Labs/ tests ordered today include:  Orders Placed This Encounter  Procedures  . Basic metabolic panel  . CBC     Disposition:   FU with Griffith Santilli 3 months  Signed, Lavelle Berland Martin Kassady Laboy, MD  12/27/2020 3:05 PM     [facility] [address] [place] [address] [phone] (office) [phone] (fax)  

## 2020-12-27 NOTE — Patient Instructions (Addendum)
Medication Instructions:  Your physician recommends that you continue on your current medications as directed. Please refer to the Current Medication list given to you today.  *If you need a refill on your cardiac medications before your next appointment, please call your pharmacy*   Lab Work: None ordered If you have labs (blood work) drawn today and your tests are completely normal, you will receive your results only by: [name] MyChart Message (if you have MyChart) OR . A paper copy in the mail If you have any lab test that is abnormal or we need to change your treatment, we will call you to review the results.   Testing/Procedures: Your physician has recommended that you have an ablation. Catheter ablation is a medical procedure used to treat some cardiac arrhythmias (irregular heartbeats). During catheter ablation, a long, thin, flexible tube is put into a blood vessel in your groin (upper thigh), or neck. This tube is called an ablation catheter. It is then guided to your heart through the blood vessel. Radio frequency waves destroy small areas of heart tissue where abnormal heartbeats may cause an arrhythmia to start. Please see the instruction sheet below under "other instructions"    Follow-Up: At [facility], you and your health needs are our priority.  As part of our continuing mission to provide you with exceptional heart care, we have created designated Provider Care Teams.  These Care Teams include your primary Cardiologist (physician) and Advanced Practice Providers (APPs -  Physician Assistants and Nurse Practitioners) who all work together to provide you with the care you need, when you need it.  Your next appointment:   1 month(s) after your ablation on 01/18/2021  The format for your next appointment:   In Person  Provider:   Allegra Lai, MD    Thank you for choosing [facility]!!   Trinidad Curet, RN [phone]   Other Instructions     Electrophysiology/Ablation Procedure Instructions   You are scheduled for a(n)  ablation on 01/18/2021 with Dr. Allegra Lai.   1.   Pre procedure testing-             A.  LAB WORK --- On 01/16/2021 (prior to your Covid screening)   for your pre procedure blood work.  You do NOT need to be fasting. You can have this done at the [facility] TEST-- On 01/16/2021 @ 11:30 am - This is a Drive Up Visit at [address] [place], [place] [address].  Someone will direct you to the appropriate testing line. Stay in your car and someone will be with you shortly.   After you are tested please go home and self quarantine until the day of your procedure.     PROCEDURE DAY: 2. On the day of your procedure 01/18/2021 you will go to [facility] [phone]) at 8:30 am.  Dennis Bast will go to the main entrance A [employer]) and enter where the [employer] are.  Your driver will drop you off and you will head down the hallway to ADMITTING.  You may have one support person come in to the hospital with you.  They will be asked to wait in the waiting room.  It is OK to have someone drop you off and come back when you are ready to be discharged.   3.   Do not eat or drink  after midnight prior to your procedure.   4.   Do not miss any doses of your blood thinner prior to the morning of your procedure or your procedure will need to be rescheduled.       Do NOT take any medications the morning of your procedure.   5.  Plan for an overnight stay, but you may be discharged home after your procedure. If you use your phone frequently bring your phone charger, in case you have to stay.  If you are discharged after your procedure you will need someone to drive you home and be with your for 24 hours after your procedure.   6. You will follow up with the AFIB clinic 4 weeks after your procedure.  You will follow up with Dr. Curt Bears  3 months after your procedure.  These  appointments will be made for you.   * If you have ANY questions please call the office [phone] and ask for Elfie Costanza RN or send me a MyChart message   * Occasionally, EP Studies and ablations can become lengthy.  Please make your family aware of this before your procedure starts.  Average time ranges from 2-8 hours for EP studies/ablations.  Your physician will call your family after the procedure with the results.                                   Cardiac Ablation Cardiac ablation is a procedure to destroy (ablate) some heart tissue that is sending bad signals. These bad signals cause problems in heart rhythm. The heart has many areas that make these signals. If there are problems in these areas, they can make the heart beat in a way that is not normal. Destroying some tissues can help make the heart rhythm normal. Tell your doctor about:  Any allergies you have.  All medicines you are taking. These include vitamins, herbs, eye drops, creams, and over-the-counter medicines.  Any problems you or family members have had with medicines that make you fall asleep (anesthetics).  Any blood disorders you have.  Any surgeries you have had.  Any medical conditions you have, such as kidney failure.  Whether you are pregnant or may be pregnant. What are the risks? This is a safe procedure. But problems may occur, including:  Infection.  Bruising and bleeding.  Bleeding into the chest.  Stroke or blood clots.  Damage to nearby areas of your body.  Allergies to medicines or dyes.  The need for a pacemaker if the normal system is damaged.  Failure of the procedure to treat the problem. What happens before the procedure? Medicines Ask your doctor about:  Changing or stopping your normal medicines. This is important.  Taking aspirin and ibuprofen. Do not take these medicines unless your doctor tells you to take them.  Taking other medicines, vitamins, herbs, and  supplements. General instructions  Follow instructions from your doctor about what you cannot eat or drink.  Plan to have someone take you home from the hospital or clinic.  If you will be going home right after the procedure, plan to have someone with you for 24 hours.  Ask your doctor what steps will be taken to prevent infection. What happens during the procedure?  An IV tube will be put into one of your veins.  You will be given a medicine to help you relax.  The skin on your neck or  groin will be numbed.  A cut (incision) will be made in your neck or groin. A needle will be put through your cut and into a large vein.  A tube (catheter) will be put into the needle. The tube will be moved to your heart.  Dye may be put through the tube. This helps your doctor see your heart.  Small devices (electrodes) on the tube will send out signals.  A type of energy will be used to destroy some heart tissue.  The tube will be taken out.  Pressure will be held on your cut. This helps stop bleeding.  A bandage will be put over your cut. The exact procedure may vary among doctors and hospitals.   What happens after the procedure?  You will be watched until you leave the hospital or clinic. This includes checking your heart rate, breathing rate, oxygen, and blood pressure.  Your cut will be watched for bleeding. You will need to lie still for a few hours.  Do not drive for 24 hours or as long as your doctor tells you. Summary  Cardiac ablation is a procedure to destroy some heart tissue. This is done to treat heart rhythm problems.  Tell your doctor about any medical conditions you may have. Tell him or her about all medicines you are taking to treat them.  This is a safe procedure. But problems may occur. These include infection, bruising, bleeding, and damage to nearby areas of your body.  Follow what your doctor tells you about food and drink. You may also be told to change or  stop some of your medicines.  After the procedure, do not drive for 24 hours or as long as your doctor tells you. This information is not intended to replace advice given to you by your health care provider. Make sure you discuss any questions you have with your health care provider. Document Revised: 07/07/2019 Document Reviewed: 07/07/2019 Elsevier Patient Education  2021 [employer].

## 2020-12-27 NOTE — Chronic Care Management (AMB) (Signed)
Chronic Care Management Pharmacy Assistant   Name: Ann Flynn  MRN: [id number] DOB: [birth year]  Reason for Encounter: Patient Assistance Coordination   12/27/2020- Patient assistance application filled out for Eliquis 5 mg with [employer] patient assistance program. Patient aware I am placing form in the mail along with out of pocket expense report from [employer]. Patient will request out of pocket expense report from [facility] and aware I will highlight all areas needed to be filled in and signed. Patient will return completed form with out of pocked expense report to Dr [name] office- Cardiology- to sign and fax.  Ann Flynn, CPP notified.    Medications: Outpatient Encounter Medications as of 12/27/2020  Medication Sig Note  . acetaminophen (TYLENOL) 650 MG CR tablet Take 650-1,300 mg by mouth every 8 (eight) hours as needed for pain.   [name] ambrisentan (LETAIRIS) 5 MG tablet Take 5 mg by mouth at bedtime.   [name] amiodarone (PACERONE) 200 MG tablet Take 1 tablet (200 mg total) by mouth daily.   [name] apixaban (ELIQUIS) 5 MG TABS tablet Take 1 tablet (5 mg total) by mouth 2 (two) times daily.   [name] atorvastatin (LIPITOR) 80 MG tablet Take 1 tablet (80 mg total) by mouth daily.   . Cholecalciferol (VITAMIN D3) 50 MCG (2000 UT) TABS Take 2,000 Units by mouth in the morning and at bedtime.   . docusate sodium (COLACE) 100 MG capsule Take 100 mg by mouth daily as needed (constipation.).    [name] Dulaglutide (TRULICITY) 7.68 GS/8.1JS SOPN Inject 0.75 mg into the skin once a week. 11/22/2020: Patient receives through [employer] Patient Assistance through December 2022  . HYDROcodone-acetaminophen (NORCO) 7.5-325 MG tablet Take 1 tablet by mouth every 6 (six) hours as needed for moderate pain.   [name] HYDROcodone-acetaminophen (NORCO/VICODIN) 5-325 MG tablet Take 1 tablet by mouth every 6 (six) hours as needed for up to 8 days for moderate pain.   [name] insulin glargine, 1 Unit Dial,  (TOUJEO) 300 UNIT/ML Solostar Pen Inject 35 Units into the skin at bedtime.   . Insulin Pen Needle (BD PEN NEEDLE NANO U/F) 32G X 4 MM MISC Use to inject insulin once daily as directed   . levofloxacin (LEVAQUIN) 250 MG tablet Take 250 mg by mouth daily.   . metoprolol succinate (TOPROL-XL) 25 MG 24 hr tablet Take 0.5 tablets (12.5 mg total) by mouth in the morning.   [name] omeprazole (PRILOSEC) 40 MG capsule Take 1 capsule (40 mg total) by mouth in the morning and at bedtime.   Ann Flynn ULTRA test strip USE AS DIRECTED   . PRESCRIPTION MEDICATION CPAP: At bedtime   . sitaGLIPtin (JANUVIA) 25 MG tablet Take 25 mg by mouth daily.   . tadalafil, PAH, (ADCIRCA) 20 MG tablet Take 2 tablets (40 mg total) by mouth at bedtime.   . torsemide (DEMADEX) 20 MG tablet Take 1 tablet (20 mg total) by mouth daily.   [name] venlafaxine (EFFEXOR) 75 MG tablet Take 1 tablet (75 mg total) by mouth daily.   . vitamin B-12 (CYANOCOBALAMIN) 1000 MCG tablet Take 1,000 mcg by mouth in the morning and at bedtime.    No facility-administered encounter medications on file as of 12/27/2020.    Star Rating Drugs: Atrovastatin 80 mg- Last filled 12/26/2020 for 30 day supply at [facility] 5 mg- last filled 03/28/2019 for 30 day supply at [facility] Trulicity 3.15 mg- Last filled 10/27/2020 for 28 day supply at [facility]  Jardiance 10 mg- Last filled 11/27/2020 for a 30 day supply at [facility] Losartan 25 mg- Last filled 08/29/2020 for 30 day supply at [facility] 25 mg- Last filled 12/17/2020 for 30 day supply at [facility]   Note: Message sent to [facility] technician regarding out of pocket expense report, pharmacy is printing for patient.   SIG: Ann Flynn, [place] [phone]

## 2020-12-27 NOTE — Telephone Encounter (Signed)
We can't send controlled medication prescriptions to [facility] electronically does she want this. She will either need to have it sent to a different pharmacy or the prescription will need to be printed and taken to [facility] by her.

## 2020-12-27 NOTE — Telephone Encounter (Signed)
Requested medication (s) are due for refill today: no  Requested medication (s) are on the active medication list: yes  Last refill:  12/26/2020  Future visit scheduled: yes  Notes to clinic: this refill cannot be delegated    Requested Prescriptions  Pending Prescriptions Disp Refills   HYDROcodone-acetaminophen (NORCO/VICODIN) 5-325 MG tablet [Pharmacy Med Name: hydrocodone 5 mg-acetaminophen 325 mg tablet] 20 tablet 0    Sig: Take 1 tablet by mouth every 6 (six) hours as needed for up to 5 days for moderate pain.      Not Delegated - Analgesics:  Opioid Agonist Combinations Failed - 12/27/2020 11:37 AM      Failed - This refill cannot be delegated      Failed - Urine Drug Screen completed in last 360 days      Passed - Valid encounter within last 6 months    Recent Outpatient Visits           1 week ago DDD (degenerative disc disease), cervical   [facility] Birdie Sons, MD   1 month ago Sciatica, left side   [facility] Birdie Sons, MD   2 months ago Diabetes mellitus without complication [facility])   [place], [place], PA-C   4 months ago Syncope, unspecified syncope type   [place], [place], PA-C   5 months ago Type 2 diabetes mellitus with stage 4 chronic kidney disease, with long-term current use of insulin [facility])   [place], Wendee [place], [place]       Future Appointments             Today [place], Ocie Doyne, MD [place], [facility]   In 1 month Fisher, Kirstie Peri, MD [facility], [facility]   In 2 months Fisher, Kirstie Peri, MD [facility], [place]

## 2020-12-27 NOTE — H&P (View-Only) (Signed)
Electrophysiology Office Note   Date:  12/27/2020   ID:  Ann Flynn, DOB Dec 06, 1940, MRN [id number]  PCP:  Birdie Sons, MD  Cardiologist:  [place] Primary Electrophysiologist:  Tiberius Loftus Meredith Leeds, MD    Chief Complaint: atrial flutter   History of Present Illness: Ann Flynn is a 80 y.o. female who is being seen today for the evaluation of atrial flutter at the request of Fisher, Kirstie Peri, MD. Presenting today for electrophysiology evaluation.  She has a history sending for morbid obesity, type 2 diabetes, OSA on CPAP, CKD stage IV, PAD, CVA, carotid surgery in 2010, PE in 2009, pulmonary artery hypertension.  She was admitted to the hospital 12/06/2020 with increasing shortness of breath.  She was found to be in atrial flutter by the heart failure clinic.  She was thus referred back to the hospital.    Today, she denies Davee symptoms of palpitations, chest pain, shortness of breath, orthopnea, PND, lower extremity edema, claudication, dizziness, presyncope, syncope, bleeding, or neurologic sequela. The patient is tolerating medications without difficulties.  Since being discharged from the hospital she has done well.  She has no chest pain.  Her shortness of breath has remained stable.  She is ready to have her atrial flutter ablated.   Past Medical History:  Diagnosis Date  . A-fib ([place])   . Anemia   . CHF (congestive heart failure) ([place])   . Chronic kidney disease   . Diabetes mellitus without complication ([place])   . Hypertension   . Pulmonary hypertension ([place])   . Syncope 07/2020   Past Surgical History:  Procedure Laterality Date  . ABDOMINAL HYSTERECTOMY  1997  . ABDOMINAL HYSTERECTOMY    . CARDIOVERSION N/A 07/10/2020   Procedure: CARDIOVERSION;  Surgeon: Jolaine Artist, MD;  Location: [facility] ENDOSCOPY;  Service: Cardiovascular;  Laterality: N/A;  . CARDIOVERSION N/A 12/07/2020   Procedure: CARDIOVERSION;  Surgeon: Jolaine Artist, MD;  Location: [facility]  ENDOSCOPY;  Service: Cardiovascular;  Laterality: N/A;  . cartiod artery surgery  2010   per patient   . CHOLECYSTECTOMY  10/19/2009  . masectomy    . MASTECTOMY  1994  . RIGHT HEART CATH N/A 07/29/2019   Procedure: RIGHT HEART CATH;  Surgeon: Jolaine Artist, MD;  Location: [place] CV LAB;  Service: Cardiovascular;  Laterality: N/A;  . RIGHT HEART CATH N/A 05/11/2020   Procedure: RIGHT HEART CATH;  Surgeon: Jolaine Artist, MD;  Location: [place] CV LAB;  Service: Cardiovascular;  Laterality: N/A;  . RIGHT HEART CATH N/A 08/07/2020   Procedure: RIGHT HEART CATH;  Surgeon: Jolaine Artist, MD;  Location: [place] CV LAB;  Service: Cardiovascular;  Laterality: N/A;     Current Outpatient Medications  Medication Sig Dispense Refill  . acetaminophen (TYLENOL) 650 MG CR tablet Take 650-1,300 mg by mouth every 8 (eight) hours as needed for pain.    [name] ambrisentan (LETAIRIS) 5 MG tablet Take 5 mg by mouth at bedtime.    [name] amiodarone (PACERONE) 200 MG tablet Take 1 tablet (200 mg total) by mouth daily. 30 tablet 0  . apixaban (ELIQUIS) 5 MG TABS tablet Take 1 tablet (5 mg total) by mouth 2 (two) times daily. 180 tablet 0  . atorvastatin (LIPITOR) 80 MG tablet Take 1 tablet (80 mg total) by mouth daily. 90 tablet 2  . Cholecalciferol (VITAMIN D3) 50 MCG (2000 UT) TABS Take 2,000 Units by mouth in the morning and at bedtime.    . docusate  sodium (COLACE) 100 MG capsule Take 100 mg by mouth daily as needed (constipation.).     [name] Dulaglutide (TRULICITY) 4.58 KD/9.8PJ SOPN Inject 0.75 mg into the skin once a week. 2 mL 1  . HYDROcodone-acetaminophen (NORCO) 7.5-325 MG tablet Take 1 tablet by mouth every 6 (six) hours as needed for moderate pain. 30 tablet 0  . HYDROcodone-acetaminophen (NORCO/VICODIN) 5-325 MG tablet Take 1 tablet by mouth every 6 (six) hours as needed for up to 5 days for moderate pain. 30 tablet 0  . insulin glargine, 1 Unit Dial, (TOUJEO) 300 UNIT/ML Solostar  Pen Inject 35 Units into the skin at bedtime. 4.5 mL 1  . Insulin Pen Needle (BD PEN NEEDLE NANO U/F) 32G X 4 MM MISC Use to inject insulin once daily as directed 100 each 2  . metoprolol succinate (TOPROL-XL) 25 MG 24 hr tablet Take 0.5 tablets (12.5 mg total) by mouth in the morning. 30 tablet 0  . omeprazole (PRILOSEC) 40 MG capsule Take 1 capsule (40 mg total) by mouth in the morning and at bedtime. 90 capsule 1  . ONETOUCH ULTRA test strip USE AS DIRECTED 100 each 1  . PRESCRIPTION MEDICATION CPAP: At bedtime    . sitaGLIPtin (JANUVIA) 25 MG tablet Take 25 mg by mouth daily.    . tadalafil, PAH, (ADCIRCA) 20 MG tablet Take 2 tablets (40 mg total) by mouth at bedtime. 180 tablet 3  . torsemide (DEMADEX) 20 MG tablet Take 1 tablet (20 mg total) by mouth daily. 30 tablet 11  . venlafaxine (EFFEXOR) 75 MG tablet Take 1 tablet (75 mg total) by mouth daily. 90 tablet 0  . vitamin B-12 (CYANOCOBALAMIN) 1000 MCG tablet Take 1,000 mcg by mouth in the morning and at bedtime.    [name] levofloxacin (LEVAQUIN) 250 MG tablet Take 250 mg by mouth daily.     No current facility-administered medications for this visit.    Allergies:   Penicillins, Sulfa antibiotics, and Uptravi [selexipag]   Social History:  The patient  reports that she has never smoked. She has never used smokeless tobacco. She reports that she does not drink alcohol and does not use drugs.   Family History:  The patient's family history includes CAD in her maternal grandmother and mother; Heart disease in her father.    ROS:  Please see the history of present illness.   Otherwise, review of systems is positive for none.   All other systems are reviewed and negative.    PHYSICAL EXAM: VS:  BP (!) 144/72   Pulse 60   Ht _0  (1.651 m)   Wt 240 lb (108.9 kg)   BMI 39.94 kg/m  , BMI Body mass index is 39.94 kg/m. GEN: Well nourished, well developed, in no acute distress  HEENT: normal  Neck: no JVD, carotid bruits, or  masses Cardiac: RRR; no murmurs, rubs, or gallops,no edema  Respiratory:  clear to auscultation bilaterally, normal work of breathing GI: soft, nontender, nondistended, + BS MS: no deformity or atrophy  Skin: warm and dry Neuro:  Strength and sensation are intact Psych: euthymic mood, full affect  EKG:  EKG is not ordered today. Personal review of the ekg ordered 5*9/22 shows sinus rhythm rate 59, T wave  Recent Labs: 01/27/2020: B Natriuretic Peptide 450.6 12/06/2020: TSH 1.283 12/08/2020: Magnesium 2.1 12/09/2020: Hemoglobin 9.5; Platelets 164 12/24/2020: ALT 12; BUN 30; Creatinine, Ser 1.72; Potassium 4.5; Sodium 138    Lipid Panel     Component Value Date/Time  CHOL 104 01/18/2020 1155   TRIG 68 01/18/2020 1155   HDL 43 01/18/2020 1155   CHOLHDL 2.4 01/18/2020 1155   LDLCALC 46 01/18/2020 1155     Wt Readings from Last 3 Encounters:  12/27/20 240 lb (108.9 kg)  12/24/20 244 lb (110.7 kg)  12/14/20 241 lb (109.3 kg)      Other studies Reviewed: Additional studies/ records that were reviewed today include: TTE 12/05/20  Review of the above records today demonstrates:  1. Left ventricular ejection fraction, by estimation, is 55 to 60%. The  left ventricle has normal function. The left ventricle has no regional  wall motion abnormalities. There is mild left ventricular hypertrophy.  Left ventricular diastolic parameters  are consistent with Grade II diastolic dysfunction (pseudonormalization).  2. Right ventricular systolic function is normal. The right ventricular  size is normal.  3. Left atrial size was mild to moderately dilated.  4. Right atrial size was mildly dilated.  5. The mitral valve is normal in structure. Trivial mitral valve  regurgitation. Mild mitral stenosis. Moderate mitral annular  calcification.  6. The aortic valve is normal in structure. There is moderate  calcification of the aortic valve. Aortic valve regurgitation is not  visualized.  Mild to moderate aortic valve sclerosis/calcification is  present, without any evidence of aortic stenosis.  7. Aortic dilatation noted. There is mild dilatation of the aortic root,  measuring 44 mm.  8. The inferior vena cava is normal in size with greater than 50%  respiratory variability, suggesting right atrial pressure of 3 mmHg.    ASSESSMENT AND PLAN:  1.  Typical appearing atrial flutter: Echo 12/05/2020 with a normal ejection fraction with severe pulmonary hypertension.  She is a poor candidate for Tikosyn.  She has been started on amiodarone.  High risk medication monitoring.  Also on Eliquis.  CHA2DS2-VASc of at least 6.  She would benefit from ablation to get off of her amiodarone.  Risk and benefits were discussed include bleeding, tamponade, heart block, stroke, damage to chest organs.  She understands these risks and has agreed to the procedure.  Case discussed with primary cardiology.  2.  Obstructive sleep apnea: CPAP compliance encouraged  3 pulmonary hypertension: Followed by heart failure team.  Current medicines are reviewed at length with the patient today.   The patient does not have concerns regarding her medicines.  The following changes were made today:  none  Labs/ tests ordered today include:  Orders Placed This Encounter  Procedures  . Basic metabolic panel  . CBC     Disposition:   FU with Leota Maka 3 months  Signed, Christo Hain Meredith Leeds, MD  12/27/2020 3:05 PM     [facility] [address] [place] [address] [phone] (office) [phone] (fax)

## 2020-12-31 ENCOUNTER — Other Ambulatory Visit: Payer: Self-pay

## 2020-12-31 ENCOUNTER — Other Ambulatory Visit: Payer: Self-pay | Admitting: Family Medicine

## 2020-12-31 ENCOUNTER — Ambulatory Visit (HOSPITAL_COMMUNITY)
Admission: RE | Admit: 2020-12-31 | Discharge: 2020-12-31 | Disposition: A | Discharge status: Home / Self Care | Payer: Medicare Other | Source: Ambulatory Visit | Attending: Adult Health | Admitting: Adult Health

## 2020-12-31 ENCOUNTER — Telehealth: Payer: Self-pay

## 2020-12-31 DIAGNOSIS — I5032 Chronic diastolic (congestive) heart failure: Secondary | ICD-10-CM | POA: Diagnosis not present

## 2020-12-31 DIAGNOSIS — M5432 Sciatica, left side: Secondary | ICD-10-CM

## 2020-12-31 LAB — BASIC METABOLIC PANEL
Anion gap: 8 (ref 5–15)
BUN: 33 mg/dL — ABNORMAL HIGH (ref 8–23)
CO2: 28 mmol/L (ref 22–32)
Calcium: 8.8 mg/dL — ABNORMAL LOW (ref 8.9–10.3)
Chloride: 103 mmol/L (ref 98–111)
Creatinine, Ser: 2.01 mg/dL — ABNORMAL HIGH (ref 0.44–1.00)
GFR, Estimated: 25 mL/min — ABNORMAL LOW (ref >60)
Glucose, Bld: 245 mg/dL — ABNORMAL HIGH (ref 70–99)
Potassium: 4.2 mmol/L (ref 3.5–5.1)
Sodium: 139 mmol/L (ref 135–145)

## 2020-12-31 NOTE — Telephone Encounter (Signed)
Requested medication (s) are due for refill today: earliest refill 12/26/20  Requested medication (s) are on the active medication list: yes   Last refill:  12/26/20 #30 0 refills   Future visit scheduled: yes in 1 month  Notes to clinic:  not delegated per protocol      Requested Prescriptions  Pending Prescriptions Disp Refills   HYDROcodone-acetaminophen (NORCO) 7.5-325 MG tablet 30 tablet 0    Sig: Take 1 tablet by mouth every 6 (six) hours as needed for moderate pain.      Not Delegated - Analgesics:  Opioid Agonist Combinations Failed - 12/31/2020  3:31 PM      Failed - This refill cannot be delegated      Failed - Urine Drug Screen completed in last 360 days      Passed - Valid encounter within last 6 months    Recent Outpatient Visits           2 weeks ago DDD (degenerative disc disease), cervical   [facility] Birdie Sons, MD   1 month ago Sciatica, left side   [facility] Birdie Sons, MD   2 months ago Diabetes mellitus without complication [facility])   [place], [place], PA-C   4 months ago Syncope, unspecified syncope type   [place], [place], PA-C   5 months ago Type 2 diabetes mellitus with stage 4 chronic kidney disease, with long-term current use of insulin [facility])   [facility] Trinna Post, PA-C       Future Appointments             In 1 month Fisher, Kirstie Peri, MD [facility], [facility]   In 2 months Fisher, Kirstie Peri, MD [facility], [place]

## 2020-12-31 NOTE — Progress Notes (Signed)
Reached out to PCP to request a refill for hydrocodone-acetaminophen on 12/31/2020 [facility].Informed PCP office we need the prescriptions to be fax to [phone] instead of escript.  Per PCP office, they will sent it by escript first because it is already tee up and he will send a message to the provider asking them to fax it over.Per PCP Office it can take up to 72 hours before receiving the refill.   [place] Pharmacist Assistant [phone]

## 2020-12-31 NOTE — Telephone Encounter (Signed)
Copied from [place] [id number]. Topic: General - Other >> Dec 31, 2020  3:22 PM Tessa Lerner A wrote: Reason for CRM: Bessie with [facility] has made contact to request that patient's prescription for HYDROcodone-acetaminophen ([place]) 7.5-325 MG be submitted via fax  [facility] has had issues receiving electronic requests for the medication  The contact information for patient's preferred pharmacy is:  [facility] - [place], [place] - [address]  Phone:  [phone] Fax:  [phone]  Please contact to further advise if needed

## 2020-12-31 NOTE — Telephone Encounter (Signed)
Copied from [place] [id number]. Topic: Quick Communication - Rx Refill/Question >> Dec 31, 2020  3:17 PM Tessa Lerner A wrote: Medication: HYDROcodone-acetaminophen (NORCO) 7.5-325 MG tablet   Has the patient contacted their pharmacy? Patient's pharmacy has made contact on their behalf  Preferred Pharmacy (with phone number or street name): [facility] - [place], [place] - [address]  Phone:  [phone] Fax:  [phone]  Agent: Please be advised that RX refills may take up to 3 business days. We ask that you follow-up with your pharmacy.

## 2021-01-01 MED ORDER — HYDROCODONE-ACETAMINOPHEN 7.5-325 MG PO TABS: 1.0000 | ORAL_TABLET | Freq: Four times a day (QID) | ORAL | 0 refills | Status: AC | PRN

## 2021-01-01 NOTE — Telephone Encounter (Signed)
Our system states that [facility] is not set up for electronic prescriptions of controlled drugs. Please check with [facility] to see if that is true. If not then we need to put in an IT ticket.  Have printed prescription, needs to be manually faxed.

## 2021-01-01 NOTE — Addendum Note (Signed)
Addended by: Lelon Huh E on: 01/01/2021 11:40 AM   Modules accepted: Orders

## 2021-01-01 NOTE — Telephone Encounter (Signed)
Dr Clarice Pole problem is with [facility] not [place].  They are working on it but don't know when it will be fixed.  They have been having trouble for a few days now.

## 2021-01-02 NOTE — Patient Instructions (Signed)
Visit Information It was great speaking with you today!  Please let me know if you have any questions about our visit.  Goals Addressed            This Visit's Progress   . Monitor and Manage My Blood Sugar-Diabetes Type 2       Timeframe:  Long-Range Goal Priority:  High Start Date:  01/02/2021                            Expected End Date: 07/05/2021                      Follow Up Date 01/22/2021   - check blood sugar at prescribed times - check blood sugar if I feel it is too high or too low - enter blood sugar readings and medication or insulin into daily log - take the blood sugar meter to all doctor visits    Why is this important?    Checking your blood sugar at home helps to keep it from getting very high or very low.   Writing the results in a diary or log helps the doctor know how to care for you.   Your blood sugar log should have the time, date and the results.   Also, write down the amount of insulin or other medicine that you take.   Other information, like what you ate, exercise done and how you were feeling, will also be helpful.     Notes:        Patient Care Plan: General Pharmacy (Adult)    Problem Identified: Hypertension, Hyperlipidemia, Diabetes, Atrial Fibrillation, Heart Failure and Chronic Kidney Disease   Priority: High    Long-Range Goal: Patient-Specific Goal   Start Date: 01/02/2021  Expected End Date: 07/05/2021  This Visit's Progress: On track  Priority: High  Note:   Current Barriers:  . Unable to independently afford treatment regimen . Unable to maintain control of Diabetes  Pharmacist Clinical Goal(s):  [name] Patient will achieve control of diabetes as evidenced by A1c less than 8% through collaboration with PharmD and provider.   Interventions: . 1:1 collaboration with Birdie Sons, MD regarding development and update of comprehensive plan of care as evidenced by provider attestation and co-signature . Inter-disciplinary care  team collaboration (see longitudinal plan of care) . Comprehensive medication review performed; medication list updated in electronic medical record  Heart Failure (Goal: manage symptoms and prevent exacerbations) -Controlled -Last ejection fraction: 55-60% (Date: 12/05/20) -HF type: Diastolic -NYHA Class: III (marked limitation of activity) -AHA HF Stage: C (Heart disease and symptoms present) -Current treatment: . Ambrisentan 5 mg nightly . Metoprolol XL 25 mg 1/2 tablet daily . Tadalafil 20 mg 2 tablets nightly  . Torsemide 20 mg daily -Medications previously tried: NA  -Educated on Proper diuretic administration and potassium supplementation Importance of blood pressure control -Recommended to continue current medication  Hyperlipidemia: (LDL goal < 70) -Controlled -Current treatment: . Atorvastatin 80 mg daily  -Medications previously tried: NA  -Educated on Importance of limiting foods high in cholesterol; -Recommended to continue current medication  Diabetes (A1c goal <8%) -Uncontrolled -Current medications: . Trulicity 3.30 mg weekly  . Toujeo 30 units nightly . Januvia 25 mg daily  -Medications previously tried: NA   -Current home glucose readings . fasting glucose: 161, 165, 140, 199, 245, 111, 185  . post prandial glucose: NA  -Blood sugars elevated now that patient  is back on normal home diet and reduced insulin dose. Will likely need to consider increasing Trulicity and stopping Januvia, will defer for now.  -Denies hypoglycemic/hyperglycemic symptoms -Educated on A1c and blood sugar goals; -INCREASE Toujeo to 35 units nightly   Atrial Fibrillation (Goal: prevent stroke and major bleeding) -Controlled -CHADSVASC: 6 -Current treatment: . Rhythm control: Amiodarone 200 mg daily  . Anticoagulation: Eliquis 5 mg twice daily  -Medications previously tried: NA -Counseled on avoidance of NSAIDs due to increased bleeding risk with anticoagulants; -Recommended to  continue current medication Assessed patient finances. Will start PAP for Eliquis  Patient Goals/Self-Care Activities . Patient will:  - check glucose twice daily, document, and provide at future appointments check blood pressure weekly, document, and provide at future appointments weigh daily, and contact provider if weight gain of greater than 2 pounds in 24 hours  Follow Up Plan: Telephone follow up appointment with care management team member scheduled for:  01/22/2021 at 1:00 PM    Patient agreed to services and verbal consent obtained.   The patient verbalized understanding of instructions, educational materials, and care plan provided today and declined offer to receive copy of patient instructions, educational materials, and care plan.   Junius Argyle, PharmD, [place] [phone]

## 2021-01-16 ENCOUNTER — Other Ambulatory Visit: Payer: Medicare Other

## 2021-01-17 NOTE — Pre-Procedure Instructions (Signed)
Instructed patient on the following items: Arrival time 0830 Nothing to eat or drink after midnight No meds AM of procedure Responsible person to drive you home and stay with you for 24 hrs  Have you missed any doses of anti-coagulant Eliquis- hasn't missed any doses

## 2021-01-18 ENCOUNTER — Ambulatory Visit (HOSPITAL_COMMUNITY)
Admission: RE | Admit: 2021-01-18 | Discharge: 2021-01-18 | Disposition: A | Discharge status: Home / Self Care | Payer: Medicare Other | Attending: Cardiology | Admitting: Cardiology

## 2021-01-18 ENCOUNTER — Other Ambulatory Visit: Payer: Self-pay

## 2021-01-18 ENCOUNTER — Ambulatory Visit (HOSPITAL_COMMUNITY): Payer: Medicare Other | Admitting: Anesthesiology

## 2021-01-18 ENCOUNTER — Telehealth: Payer: Self-pay

## 2021-01-18 ENCOUNTER — Ambulatory Visit (HOSPITAL_COMMUNITY)
Admission: RE | Disposition: A | Discharge status: Home / Self Care | Payer: Self-pay | Source: Home / Self Care | Attending: Cardiology

## 2021-01-18 DIAGNOSIS — E1122 Type 2 diabetes mellitus with diabetic chronic kidney disease: Secondary | ICD-10-CM | POA: Diagnosis not present

## 2021-01-18 DIAGNOSIS — Z880 Allergy status to penicillin: Secondary | ICD-10-CM | POA: Insufficient documentation

## 2021-01-18 DIAGNOSIS — Z888 Allergy status to other drugs, medicaments and biological substances status: Secondary | ICD-10-CM | POA: Insufficient documentation

## 2021-01-18 DIAGNOSIS — Z86711 Personal history of pulmonary embolism: Secondary | ICD-10-CM | POA: Insufficient documentation

## 2021-01-18 DIAGNOSIS — I4892 Unspecified atrial flutter: Secondary | ICD-10-CM | POA: Diagnosis not present

## 2021-01-18 DIAGNOSIS — I2721 Secondary pulmonary arterial hypertension: Secondary | ICD-10-CM | POA: Diagnosis not present

## 2021-01-18 DIAGNOSIS — Z882 Allergy status to sulfonamides status: Secondary | ICD-10-CM | POA: Insufficient documentation

## 2021-01-18 DIAGNOSIS — I130 Hypertensive heart and chronic kidney disease with heart failure and stage 1 through stage 4 chronic kidney disease, or unspecified chronic kidney disease: Secondary | ICD-10-CM | POA: Diagnosis not present

## 2021-01-18 DIAGNOSIS — Z6839 Body mass index (BMI) 39.0-39.9, adult: Secondary | ICD-10-CM | POA: Insufficient documentation

## 2021-01-18 DIAGNOSIS — Z8249 Family history of ischemic heart disease and other diseases of the circulatory system: Secondary | ICD-10-CM | POA: Insufficient documentation

## 2021-01-18 DIAGNOSIS — Z9049 Acquired absence of other specified parts of digestive tract: Secondary | ICD-10-CM | POA: Diagnosis not present

## 2021-01-18 DIAGNOSIS — Z7901 Long term (current) use of anticoagulants: Secondary | ICD-10-CM | POA: Diagnosis not present

## 2021-01-18 DIAGNOSIS — G4733 Obstructive sleep apnea (adult) (pediatric): Secondary | ICD-10-CM | POA: Insufficient documentation

## 2021-01-18 DIAGNOSIS — I509 Heart failure, unspecified: Secondary | ICD-10-CM | POA: Insufficient documentation

## 2021-01-18 DIAGNOSIS — Z8673 Personal history of transient ischemic attack (TIA), and cerebral infarction without residual deficits: Secondary | ICD-10-CM | POA: Insufficient documentation

## 2021-01-18 DIAGNOSIS — Z794 Long term (current) use of insulin: Secondary | ICD-10-CM | POA: Diagnosis not present

## 2021-01-18 DIAGNOSIS — N184 Chronic kidney disease, stage 4 (severe): Secondary | ICD-10-CM | POA: Diagnosis not present

## 2021-01-18 DIAGNOSIS — I4891 Unspecified atrial fibrillation: Secondary | ICD-10-CM | POA: Insufficient documentation

## 2021-01-18 DIAGNOSIS — Z79899 Other long term (current) drug therapy: Secondary | ICD-10-CM | POA: Diagnosis not present

## 2021-01-18 HISTORY — PX: A-FLUTTER ABLATION: EP1230

## 2021-01-18 LAB — CBC
HCT: 33.2 % — ABNORMAL LOW (ref 36.0–46.0)
Hemoglobin: 10 g/dL — ABNORMAL LOW (ref 12.0–15.0)
MCH: 30.4 pg (ref 26.0–34.0)
MCHC: 30.1 g/dL (ref 30.0–36.0)
MCV: 100.9 fL — ABNORMAL HIGH (ref 80.0–100.0)
Platelets: 267 10*3/uL (ref 150–400)
RBC: 3.29 MIL/uL — ABNORMAL LOW (ref 3.87–5.11)
RDW: 14.9 % (ref 11.5–15.5)
WBC: 4.9 10*3/uL (ref 4.0–10.5)
nRBC: 0 % (ref 0.0–0.2)

## 2021-01-18 LAB — GLUCOSE, CAPILLARY
Glucose-Capillary: 92 mg/dL (ref 70–99)
Glucose-Capillary: 89 mg/dL (ref 70–99)

## 2021-01-18 SURGERY — A-FLUTTER ABLATION
Anesthesia: General

## 2021-01-18 MED ORDER — SODIUM CHLORIDE 0.9 % IV SOLN: INTRAVENOUS | Status: DC | PRN

## 2021-01-18 MED ORDER — FENTANYL CITRATE (PF) 250 MCG/5ML IJ SOLN
INTRAMUSCULAR | Status: DC | PRN
  Administered 2021-01-18: 100 ug via INTRAVENOUS

## 2021-01-18 MED ORDER — SUGAMMADEX SODIUM 200 MG/2ML IV SOLN
INTRAVENOUS | Status: DC | PRN
  Administered 2021-01-18: 200 mg via INTRAVENOUS

## 2021-01-18 MED ORDER — PHENYLEPHRINE 40 MCG/ML (10ML) SYRINGE FOR IV PUSH (FOR BLOOD PRESSURE SUPPORT)
PREFILLED_SYRINGE | INTRAVENOUS | Status: DC | PRN
  Administered 2021-01-18: 80 ug via INTRAVENOUS
  Administered 2021-01-18: 120 ug via INTRAVENOUS

## 2021-01-18 MED ORDER — SODIUM CHLORIDE 0.9 % IV SOLN: INTRAVENOUS | Status: DC

## 2021-01-18 MED ORDER — SODIUM CHLORIDE 0.9% FLUSH: 3.0000 mL | INTRAVENOUS | Status: DC | PRN

## 2021-01-18 MED ORDER — ROCURONIUM BROMIDE 10 MG/ML (PF) SYRINGE
PREFILLED_SYRINGE | INTRAVENOUS | Status: DC | PRN
  Administered 2021-01-18: 60 mg via INTRAVENOUS

## 2021-01-18 MED ORDER — FENTANYL CITRATE (PF) 100 MCG/2ML IJ SOLN
INTRAMUSCULAR | Status: AC
  Filled 2021-01-18: qty 2

## 2021-01-18 MED ORDER — ONDANSETRON HCL 4 MG/2ML IJ SOLN: 4.0000 mg | Freq: Four times a day (QID) | INTRAMUSCULAR | Status: DC | PRN

## 2021-01-18 MED ORDER — LIDOCAINE 2% (20 MG/ML) 5 ML SYRINGE
INTRAMUSCULAR | Status: DC | PRN
  Administered 2021-01-18: 60 mg via INTRAVENOUS

## 2021-01-18 MED ORDER — HEPARIN (PORCINE) IN NACL 1000-0.9 UT/500ML-% IV SOLN
INTRAVENOUS | Status: DC | PRN
  Administered 2021-01-18: 500 mL

## 2021-01-18 MED ORDER — ONDANSETRON HCL 4 MG/2ML IJ SOLN
INTRAMUSCULAR | Status: DC | PRN
  Administered 2021-01-18: 4 mg via INTRAVENOUS

## 2021-01-18 MED ORDER — PROPOFOL 10 MG/ML IV BOLUS
INTRAVENOUS | Status: DC | PRN
  Administered 2021-01-18: 40 mg via INTRAVENOUS
  Administered 2021-01-18: 60 mg via INTRAVENOUS

## 2021-01-18 MED ORDER — ACETAMINOPHEN 325 MG PO TABS
650.0000 mg | ORAL_TABLET | ORAL | Status: DC | PRN
  Filled 2021-01-18: qty 2

## 2021-01-18 MED ORDER — DEXAMETHASONE SODIUM PHOSPHATE 10 MG/ML IJ SOLN
INTRAMUSCULAR | Status: DC | PRN
  Administered 2021-01-18: 5 mg via INTRAVENOUS

## 2021-01-18 MED ORDER — SODIUM CHLORIDE 0.9% FLUSH: 3.0000 mL | Freq: Two times a day (BID) | INTRAVENOUS | Status: DC

## 2021-01-18 MED ORDER — SODIUM CHLORIDE 0.9 % IV SOLN: 250.0000 mL | INTRAVENOUS | Status: DC | PRN

## 2021-01-18 MED ORDER — PHENYLEPHRINE HCL-NACL 10-0.9 MG/250ML-% IV SOLN
INTRAVENOUS | Status: DC | PRN
  Administered 2021-01-18: 50 ug/min via INTRAVENOUS

## 2021-01-18 SURGICAL SUPPLY — 11 items
CATH EZ STEER NAV 8MM F-J CUR (ABLATOR) ×2 IMPLANT
CATH WEB BI DIR CSDF CRV REPRO (CATHETERS) ×2 IMPLANT
CLOSURE PERCLOSE PROSTYLE (VASCULAR PRODUCTS) ×4 IMPLANT
PACK EP LATEX FREE (CUSTOM PROCEDURE TRAY) ×2
PACK EP LF (CUSTOM PROCEDURE TRAY) ×1 IMPLANT
PAD PRO RADIOLUCENT 2001M-C (PAD) ×2 IMPLANT
PATCH CARTO3 (PAD) ×2 IMPLANT
SHEATH PINNACLE 7F 10CM (SHEATH) ×2 IMPLANT
SHEATH PINNACLE 8F 10CM (SHEATH) ×2 IMPLANT
SHEATH PROBE COVER 6X72 (BAG) ×2 IMPLANT
SHEATH SWARTZ RAMP 8.5F 60CM (SHEATH) ×2 IMPLANT

## 2021-01-18 NOTE — Anesthesia Procedure Notes (Signed)
Procedure Name: Intubation Date/Time: 01/18/2021 11:25 AM Performed by: Renato Shin, CRNA Pre-anesthesia Checklist: Patient identified, Emergency Drugs available, Suction available and Patient being monitored Patient Re-evaluated:Patient Re-evaluated prior to induction Oxygen Delivery Method: Circle system utilized Preoxygenation: Pre-oxygenation with 100% oxygen Induction Type: IV induction Ventilation: Mask ventilation without difficulty and Oral airway inserted - appropriate to patient size Laryngoscope Size: Sabra Heck and 2 Grade View: Grade I Tube type: Oral Tube size: 7.0 mm Number of attempts: 1 Airway Equipment and Method: Stylet and Oral airway Placement Confirmation: ETT inserted through vocal cords under direct vision,  positive ETCO2 and breath sounds checked- equal and bilateral Secured at: 21 cm Tube secured with: Tape Dental Injury: Teeth and Oropharynx as per pre-operative assessment

## 2021-01-18 NOTE — Progress Notes (Signed)
Chronic Care Management Pharmacy Assistant   Name: Ann Flynn  MRN: [id number] DOB: Jan 12, 1941  Reason for Encounter: Medication Review /Adherence fill report    Medications: Facility-Administered Encounter Medications as of 01/18/2021  Medication  . 0.9 %  sodium chloride infusion   Outpatient Encounter Medications as of 01/18/2021  Medication Sig Note  . acetaminophen (TYLENOL) 325 MG tablet Take 650 mg by mouth in the morning and at bedtime.   [name] ambrisentan (LETAIRIS) 5 MG tablet Take 5 mg by mouth at bedtime.   [name] amiodarone (PACERONE) 200 MG tablet Take 1 tablet (200 mg total) by mouth daily. (Patient taking differently: Take 200 mg by mouth in the morning.)   . apixaban (ELIQUIS) 5 MG TABS tablet Take 1 tablet (5 mg total) by mouth 2 (two) times daily.   [name] atorvastatin (LIPITOR) 80 MG tablet Take 1 tablet (80 mg total) by mouth daily. (Patient taking differently: Take 80 mg by mouth in the morning.)   . Cholecalciferol (VITAMIN D3) 50 MCG (2000 UT) TABS Take 2,000 Units by mouth in the morning and at bedtime.   . docusate sodium (COLACE) 100 MG capsule Take 100 mg by mouth daily as needed (constipation.).  01/15/2021: On hand  . Dulaglutide (TRULICITY) 4.25 ZD/6.3OV SOPN Inject 0.75 mg into the skin once a week. (Patient taking differently: Inject 0.75 mg into the skin every Friday.) 11/22/2020: Patient receives through [employer] Patient Assistance through December 2022  . HYDROcodone-acetaminophen (NORCO) 7.5-325 MG tablet Take 1 tablet by mouth every 6 (six) hours as needed for moderate pain. 01/15/2021: On hand  . insulin glargine, 1 Unit Dial, (TOUJEO) 300 UNIT/ML Solostar Pen Inject 35 Units into the skin at bedtime.   . Insulin Pen Needle (BD PEN NEEDLE NANO U/F) 32G X 4 MM MISC Use to inject insulin once daily as directed   . metoprolol succinate (TOPROL-XL) 25 MG 24 hr tablet Take 0.5 tablets (12.5 mg total) by mouth in the morning.   [name] omeprazole (PRILOSEC) 40 MG capsule  Take 1 capsule (40 mg total) by mouth in the morning and at bedtime. (Patient taking differently: Take 40 mg by mouth in the morning.)   . ONETOUCH ULTRA test strip USE AS DIRECTED   . PRESCRIPTION MEDICATION CPAP: At bedtime   . sitaGLIPtin (JANUVIA) 25 MG tablet Take 25 mg by mouth in the morning.   . tadalafil, PAH, (ADCIRCA) 20 MG tablet Take 2 tablets (40 mg total) by mouth at bedtime.   . torsemide (DEMADEX) 20 MG tablet Take 1 tablet (20 mg total) by mouth daily. (Patient taking differently: Take 20 mg by mouth daily as needed (swelling/water retention).)   . venlafaxine (EFFEXOR) 75 MG tablet Take 1 tablet (75 mg total) by mouth daily. (Patient taking differently: Take 75 mg by mouth in the morning.)   . vitamin B-12 (CYANOCOBALAMIN) 1000 MCG tablet Take 1,000 mcg by mouth in the morning and at bedtime.     Star Rating Drugs: Atrovastatin 80 mg- Last filled 12/26/2020 for 30 day supply at [facility] 5 mg- last filled 03/28/2019 for 30 day supply at [place] Trulicity 5.64 mg- Last filled 10/27/2020 for 28 day supply at [facility] 10 mg- Last filled 11/27/2020 for a 30 day supply at [facility] Losartan 25 mg- Last filled 08/29/2020 for 30 day supply at [place] 25 mg- Last filled 12/17/2020 for 30 day supply at [place] chart and [facility]  fill  reports. Per [facility]  fill reports patient is due for a refill for Warfarin 4 mg ,but on 12/06/2020 [facility] switch Warfarin 4 mg to Eliquis 5 mg 1 tablet two times daily will notify Junius Argyle, Clinical pharmacist and [facility].    Patient is schedule for a follow up with clinical pharmacist on 01/22/2021 at 1:00 pm.  [place] Pharmacist Assistant [phone]

## 2021-01-18 NOTE — Progress Notes (Signed)
Ambulated in hallway and to the bathroom to void tol well.

## 2021-01-18 NOTE — Interval H&P Note (Signed)
History and Physical Interval Note:  01/18/2021 10:30 AM  Ann Flynn  has presented today for surgery, with the diagnosis of AFLUTTER.  The various methods of treatment have been discussed with the patient and family. After consideration of risks, benefits and other options for treatment, the patient has consented to  Procedure(s): A-FLUTTER ABLATION (N/A) as a surgical intervention.  The patient's history has been reviewed, patient examined, no change in status, stable for surgery.  I have reviewed the patient's chart and labs.  Questions were answered to the patient's satisfaction.     Franshesca Chipman [employer]

## 2021-01-18 NOTE — Progress Notes (Signed)
Discharge instructions reviewed with pt and her daughter both voice understanding.  

## 2021-01-18 NOTE — Discharge Instructions (Signed)

## 2021-01-18 NOTE — Transfer of Care (Signed)
Immediate Anesthesia Transfer of Care Note  Patient: Ann Flynn  Procedure(s) Performed: A-FLUTTER ABLATION (N/A )  Patient Location: PACU and Cath Lab  Anesthesia Type:General  Level of Consciousness: awake and patient cooperative  Airway & Oxygen Therapy: Patient Spontanous Breathing and Patient connected to face mask oxygen  Post-op Assessment: Report given to RN and Post -op Vital signs reviewed and stable  Post vital signs: Reviewed and stable  Last Vitals:  Vitals Value Taken Time  BP 171/39 01/18/21 1250  Temp    Pulse 61 01/18/21 1250  Resp 16 01/18/21 1250  SpO2 97 % 01/18/21 1250  Vitals shown include unvalidated device data.  Last Pain:  Vitals:   01/18/21 0906  TempSrc:   PainSc: 0-No pain         Complications: No complications documented.

## 2021-01-21 ENCOUNTER — Encounter (HOSPITAL_COMMUNITY): Payer: Self-pay | Admitting: Cardiology

## 2021-01-21 ENCOUNTER — Telehealth: Payer: Self-pay

## 2021-01-21 ENCOUNTER — Other Ambulatory Visit: Payer: Self-pay

## 2021-01-21 ENCOUNTER — Ambulatory Visit (HOSPITAL_COMMUNITY)
Admission: RE | Admit: 2021-01-21 | Discharge: 2021-01-21 | Disposition: A | Discharge status: Home / Self Care | Payer: Medicare Other | Source: Ambulatory Visit | Attending: Cardiology | Admitting: Cardiology

## 2021-01-21 VITALS — BP 164/98 | HR 56 | Wt 240.4 lb

## 2021-01-21 DIAGNOSIS — Z79899 Other long term (current) drug therapy: Secondary | ICD-10-CM | POA: Diagnosis not present

## 2021-01-21 DIAGNOSIS — I130 Hypertensive heart and chronic kidney disease with heart failure and stage 1 through stage 4 chronic kidney disease, or unspecified chronic kidney disease: Secondary | ICD-10-CM | POA: Diagnosis not present

## 2021-01-21 DIAGNOSIS — E1122 Type 2 diabetes mellitus with diabetic chronic kidney disease: Secondary | ICD-10-CM | POA: Insufficient documentation

## 2021-01-21 DIAGNOSIS — I2510 Atherosclerotic heart disease of native coronary artery without angina pectoris: Secondary | ICD-10-CM | POA: Diagnosis not present

## 2021-01-21 DIAGNOSIS — R030 Elevated blood-pressure reading, without diagnosis of hypertension: Secondary | ICD-10-CM | POA: Insufficient documentation

## 2021-01-21 DIAGNOSIS — Z9049 Acquired absence of other specified parts of digestive tract: Secondary | ICD-10-CM | POA: Diagnosis not present

## 2021-01-21 DIAGNOSIS — Z86711 Personal history of pulmonary embolism: Secondary | ICD-10-CM | POA: Insufficient documentation

## 2021-01-21 DIAGNOSIS — Z880 Allergy status to penicillin: Secondary | ICD-10-CM | POA: Insufficient documentation

## 2021-01-21 DIAGNOSIS — Z8249 Family history of ischemic heart disease and other diseases of the circulatory system: Secondary | ICD-10-CM | POA: Diagnosis not present

## 2021-01-21 DIAGNOSIS — R001 Bradycardia, unspecified: Secondary | ICD-10-CM | POA: Diagnosis not present

## 2021-01-21 DIAGNOSIS — Z794 Long term (current) use of insulin: Secondary | ICD-10-CM | POA: Insufficient documentation

## 2021-01-21 DIAGNOSIS — I5032 Chronic diastolic (congestive) heart failure: Secondary | ICD-10-CM | POA: Diagnosis not present

## 2021-01-21 DIAGNOSIS — M7989 Other specified soft tissue disorders: Secondary | ICD-10-CM | POA: Diagnosis not present

## 2021-01-21 DIAGNOSIS — N179 Acute kidney failure, unspecified: Secondary | ICD-10-CM | POA: Insufficient documentation

## 2021-01-21 DIAGNOSIS — I509 Heart failure, unspecified: Secondary | ICD-10-CM | POA: Diagnosis not present

## 2021-01-21 DIAGNOSIS — J9611 Chronic respiratory failure with hypoxia: Secondary | ICD-10-CM | POA: Diagnosis not present

## 2021-01-21 DIAGNOSIS — R55 Syncope and collapse: Secondary | ICD-10-CM | POA: Diagnosis not present

## 2021-01-21 DIAGNOSIS — Z882 Allergy status to sulfonamides status: Secondary | ICD-10-CM | POA: Insufficient documentation

## 2021-01-21 DIAGNOSIS — I272 Pulmonary hypertension, unspecified: Secondary | ICD-10-CM | POA: Diagnosis not present

## 2021-01-21 DIAGNOSIS — I471 Supraventricular tachycardia: Secondary | ICD-10-CM | POA: Insufficient documentation

## 2021-01-21 DIAGNOSIS — Z8673 Personal history of transient ischemic attack (TIA), and cerebral infarction without residual deficits: Secondary | ICD-10-CM | POA: Insufficient documentation

## 2021-01-21 DIAGNOSIS — N184 Chronic kidney disease, stage 4 (severe): Secondary | ICD-10-CM | POA: Insufficient documentation

## 2021-01-21 DIAGNOSIS — Z7722 Contact with and (suspected) exposure to environmental tobacco smoke (acute) (chronic): Secondary | ICD-10-CM | POA: Insufficient documentation

## 2021-01-21 LAB — BASIC METABOLIC PANEL
Anion gap: 8 (ref 5–15)
BUN: 44 mg/dL — ABNORMAL HIGH (ref 8–23)
CO2: 30 mmol/L (ref 22–32)
Calcium: 8.4 mg/dL — ABNORMAL LOW (ref 8.9–10.3)
Chloride: 103 mmol/L (ref 98–111)
Creatinine, Ser: 2.1 mg/dL — ABNORMAL HIGH (ref 0.44–1.00)
GFR, Estimated: 24 mL/min — ABNORMAL LOW (ref >60)
Glucose, Bld: 162 mg/dL — ABNORMAL HIGH (ref 70–99)
Potassium: 4.6 mmol/L (ref 3.5–5.1)
Sodium: 141 mmol/L (ref 135–145)

## 2021-01-21 LAB — BRAIN NATRIURETIC PEPTIDE: B Natriuretic Peptide: 253.3 pg/mL — ABNORMAL HIGH (ref 0.0–100.0)

## 2021-01-21 MED ORDER — TORSEMIDE 20 MG PO TABS: 20.0000 mg | ORAL_TABLET | Freq: Every day | ORAL | 11 refills | Status: AC

## 2021-01-21 NOTE — Chronic Care Management (AMB) (Signed)
01/21/2021- Patient called to remind of appointment with Junius Argyle, CPP on 01/22/2021 @ 1:00 pm   Patient aware of appointment date, time, and type of appointment (either telephone or in person). Patient aware to have/bring all medications, supplements, blood pressure and/or blood sugar logs to visit.  Questions: Have you had any recent office visit or specialist visit outside of [place]? No recent visits outside of [place].  Are there any concerns you would like to discuss during your office visit? Patient does not have any concerns that she would like to discuss.  Are you having any problems obtaining your medications? (Whether it pharmacy issues or cost) Patient denies any problems with obtaining medications.  If patient has any PAP medications ask if they are having any problems getting their PAP medication or refill? Patient has Eliquis PAP with [employer] patient assistance program, medication prescribed by Dr [name] (Cardiology).   Star Rating Drug: Atorvastatin 80 mg- Last filled 12/26/2020 for 30 day supply at [facility]. Farxiga 5 mg- Last filled 03/28/2019 for 30 day supply at [facility]. Trulicity- Last filled 0/08/7492 for 28 day supply at [facility]. Jardiance 10 mg- Last filled 11/27/2020 for 30 day supply at [facility]. Losartan 25 mg- Last filled 08/29/2020 for 30 day supply at [facility]. Januvia 25 mg- Last filled 12/17/2020 for 30 day supply at [facility].   Any gaps in medications fill history? Fill history reviewed, no gaps in medications noticed.   SIG: Pattricia Boss, [place] Pharmacist Assistant [phone]

## 2021-01-21 NOTE — Patient Instructions (Signed)
TAKE Torsemide 67m (1 tab) daily  STOP Amiodarone  Labs today We will only contact you if something comes back abnormal or we need to make some changes. Otherwise no news is good news!  Repeat labs in 7 days  Your physician recommends that you schedule a follow-up appointment in: Keep next appointment with Dr BHaroldine Laws Please call office at [phone] if you have any questions or concerns.   At the [facility] you and your health needs are our priority. As part of our continuing mission to provide you with exceptional heart care, we have created designated Provider Care Teams. These Care Teams include your primary Cardiologist (physician) and Advanced Practice Providers (APPs- Physician Assistants and Nurse Practitioners) who all work together to provide you with the care you need, when you need it.   You may see any of the following providers on your designated Care Team at your next follow up: .Marland KitchenDr DGlori Bickers. Dr DLoralie Champagne. Dr BVickki Muff. ADarrick Grinder NP . BLyda Jester PCreola. LAudry Riles PharmD   Please be sure to bring in all your medications bottles to every appointment.

## 2021-01-21 NOTE — Progress Notes (Signed)
Advanced Heart Failure Clinic Note  PCP:  Birdie Sons, MD  Cardiologist:  Dr. Rockey Situ HF Cardiologist: Dr. [name] EP: Dr. Curt Bears   Chief Complaint: F/u for chronic diastolic heart failure    History of Present Illness:  Ann Flynn is a 80 year old woman with h/o morbid obesity, DM, OSA on CPAP, CKD 4, PAD, CVA, carotid surgery in 2010, pulmonary embolism (2009) and PAH referred by [facility] for further evaluation of her [place].   She moved from [place], [place] in 2021 to be near her family as things were getting harder. Lives by herself in a townhome.  Reports DVT/PE in 2009 and has been on coumadin since. Was diagnosed with PAH in 2016 at [facility] in [place]. Right and left heart cath at that time reported as nonobstructive CAD and severe PAH with PAP 103/19 (51) with PCWP 5. (see results below) Was told she had idiopathic PAH and started on Letairis and cialis. She weighed 255 pounds at the time.   Never smoked but was exposed to 2nd hand smoke. Previouslyon portable oxygen at 5L when she was in [place], but the portable O2 was not covered. Now "goes without oxygen", only at nightthat she wear oxygen with CPAP. Denies h/o CTD.  Evaluated by Dr. [name] for the first time in 11/20: Felt to have primarily WHO Group III PH. Echo and VQ ordered.   Had a syncopal episode in 8/21. CT head showed small 2 mm subdural hematoma. Echocardiogram was obtained, echocardiogram showed EF 55 to 60%, moderate left ventricular hypertrophy, moderately elevated pulmonary artery systolic pressure. Outpatient monitor was ordered and showed atrial flutter continuously (100% burden) with rates ranging from 50 to 133 (average 85 bpm). F/u CT scan 8/23 SDH resolved  OV 11/21 She felt more dizzy since decreasing torsemide, had a fall over Halloween. Ann Flynn was started. Had successful DCCV on 07/10/20.  Admitted to [facility] 12/21  after a syncopal episode associated with dizziness prior to  passing out. HR was in the 40-50s when EMS arrived. She admits to running out of her [name] recently. Echo 12/21 with EF 60-65%, RV normal, RHC with moderate to severe PAH with high PA pulse pressure and high cardiac output & moderately elevated left-sided pressures. Cardiac MRI showed EF 51%, mild RV dilation, mod pulmonary regurgitation, markedly dilated main pulmonay artery, and RV insertion site LGE. Patient discharged home with Zio patch. Discharge weight 242 lbs.  Clinic visit 2/22, we started Uptravi after [place] in 12/21 showed worsening PAH. Did not tolerate due to HAs, nausea and hip pain. However hip pain has persisted after stopping and found to have degenerative disc disease. Says she is doing ok. Can do ADLs slowly. No change in breathing. No orthopnea, edema or PND. Using CPAP every night. Now taking Letairis 57m and tadalafil 40 daily    12/06/20 she was admitted for symptomatic AFL w/ RVR. INR was supra therapeutic at 8.6 and reversed w/ Vit K.  She underwent successful DCCV on 4/22. EP evaluated and recommended PO amiodarone and outpatient f/u for discussion regarding possible atrial flutter ablation. Given difficulties regulating therapeutic INRs, she was transitioned off of warfarin to Eliquis. Volume status remained stable during hospitalization, however she had AKI w/ SCr bump to 2.4 (baseline 1.8-2.0). torsemide was changed from daily to PRN. Losartan and Jardiance both discontinued. Tadalafil 40 mg and letairis 5 mg also continued. D/c wt was 236 lb.   At post hospital f/u visit on 5/9,  Her wt was  up from 236>>244 lb (previous clinic wt was 232 lb). She has used torsemide ~2-3 times since discharge. Denied any significant change in respiratory status, remained on 3L/[place] which is her baseline. BP was midly elevated 140/78. She was instructed to change torsemide back to 20 mg daily, however she reports she forgot she was to do this. She has been taking it PRN based on hand and ankle  swelling, which has turned out to be every other day.   On 01/18/21, she underwent AFL ablation by Dr. Curt Bears.   She returns to clinic today for f/u. EKG shows sinus bradycardia 51 bpm. Doing well post ablation. Groin ok. Denies pain. Still on amiodarone. Fully compliant w/ Eliquis and denies abnormal bleeding. Wt still up at 240 lb, above previous baseline. Respiratory status stable. BP elevated today 161W systolic. She reports usually in 120s-130s at home. Took all meds this morning.     08/06/20 Echo  EF 60-65% RV normal   cMRI 08/07/20: 1. Motion artifact throughout exam affects quantification of volumes 2. No evidence of shunt with Qp/Qs = 0.94 3. Normal LV size and systolic function (EF 96%) 4. Mild RV dilatation with normal systolic function (EF 04%) 5. Moderate pulmonary regurgitation (regurgitant fraction 23%) 6. RV insertion site late gadolinium enhancement, which is a nonspecific finding often seen in setting of pulmonary hypertension 7. Markedly dilated main pulmonary artery, measuring 42m x 412m8. Mild mitral regurgitation (regurgitant fraction 18%), mild aortic regurgitation (regurgitant fraction 15%), and mild tricuspid regurgitation (regurgitant fraction 12%)  VQ 07/07/19: No PE  RHC  07/29/19 RA = 8 RV = 81/14 PA =  80/18 (42) PCW =17 Fick cardiac output/index = 9.25/4.25 PVR = 2.70 WU FA sat = 98% PA sat = 75%, 75% High SVC 72%  RHC 9/21 RA = 8 RV = 65/7 PA = 61/19 (34) PCW = 19 (v= 27) Fick cardiac output/index = 8.1/3.8 PVR = 1.2 WU Ao sat = 99% PA sat = 75%, 78% SVC = 70%  RHC 12/21 RA = 11 RV = 91/14 PA = 93/20 (47) PCW = 21 Fick cardiac output/index = 8.0/3.7 PVR = 3.1 WU FA sat = 99% PA sat = 72%, 73% High SVC = 72% PAPi = 6.6   PFTs 09/28/19  FEV1 1.92 (57%) FVC 2.57 (58%) DLCO 61%  Previous cardiac studies:  12/16/2014 right and left heart catheterization with nitric oxide inhalation, Pressure data resting: -Mean right  atrial pressure 2 Right ventricule 10540/9nd diastolic -Pulmonary artery 103/19, mean 51. Pulmonary artery wedge mean 5 -Ascending thoracic aorta 111/53, mean 75. -Fick cardiac output 4.2 L. -Pulmonary resistance 12.25 Woods units. With nitric oxide inhalation -Pulmonary artery 89/29, mean 42. -Right ventricle 9181/1nd diastolic. -Right atrial 2. -Fick cardiac output 4.94 L. -Pulmonary resistance 15.91 Woods units.  Carotid [place]/22:    Past Medical History:  Diagnosis Date  . A-fib ([facility]  . Anemia   . CHF (congestive heart failure) ([facility]  . Chronic kidney disease   . Diabetes mellitus without complication ([facility]  . Hypertension   . Pulmonary hypertension ([facility]  . Syncope 07/2020   Past Surgical History:  Procedure Laterality Date  . A-FLUTTER ABLATION N/A 01/18/2021   Procedure: A-FLUTTER ABLATION;  Surgeon: CaConstance HawMD;  [facility];  Service: Cardiovascular;  Laterality: N/A;  . ABDOMINAL HYSTERECTOMY  1997  . ABDOMINAL HYSTERECTOMY    . CARDIOVERSION N/A 07/10/2020   Procedure: CARDIOVERSION;  Surgeon: Bensimhon,  Shaune Pascal, MD;  Location: [facility] ENDOSCOPY;  Service: Cardiovascular;  Laterality: N/A;  . CARDIOVERSION N/A 12/07/2020   Procedure: CARDIOVERSION;  Surgeon: Jolaine Artist, MD;  Location: [facility] ENDOSCOPY;  Service: Cardiovascular;  Laterality: N/A;  . cartiod artery surgery  2010   per patient   . CHOLECYSTECTOMY  10/19/2009  . masectomy    . MASTECTOMY  1994  . RIGHT HEART CATH N/A 07/29/2019   Procedure: RIGHT HEART CATH;  Surgeon: Jolaine Artist, MD;  Location: [place] CV LAB;  Service: Cardiovascular;  Laterality: N/A;  . RIGHT HEART CATH N/A 05/11/2020   Procedure: RIGHT HEART CATH;  Surgeon: Jolaine Artist, MD;  Location: [place] CV LAB;  Service: Cardiovascular;  Laterality: N/A;  . RIGHT HEART CATH N/A 08/07/2020   Procedure: RIGHT HEART CATH;  Surgeon: Jolaine Artist, MD;  Location: [place]  CV LAB;  Service: Cardiovascular;  Laterality: N/A;    Current Outpatient Medications  Medication Sig Dispense Refill  . acetaminophen (TYLENOL) 325 MG tablet Take 650 mg by mouth in the morning and at bedtime.    [name] ambrisentan (LETAIRIS) 5 MG tablet Take 5 mg by mouth at bedtime.    [name] amiodarone (PACERONE) 200 MG tablet Take 1 tablet (200 mg total) by mouth daily. (Patient taking differently: Take 200 mg by mouth in the morning.) 30 tablet 0  . apixaban (ELIQUIS) 5 MG TABS tablet Take 1 tablet (5 mg total) by mouth 2 (two) times daily. 180 tablet 0  . atorvastatin (LIPITOR) 80 MG tablet Take 1 tablet (80 mg total) by mouth daily. (Patient taking differently: Take 80 mg by mouth in the morning.) 90 tablet 2  . Cholecalciferol (VITAMIN D3) 50 MCG (2000 UT) TABS Take 2,000 Units by mouth in the morning and at bedtime.    . docusate sodium (COLACE) 100 MG capsule Take 100 mg by mouth daily as needed (constipation.).     [name] Dulaglutide (TRULICITY) 0.62 IR/4.8NI SOPN Inject 0.75 mg into the skin once a week. (Patient taking differently: Inject 0.75 mg into the skin every Friday.) 2 mL 1  . HYDROcodone-acetaminophen (NORCO) 7.5-325 MG tablet Take 1 tablet by mouth every 6 (six) hours as needed for moderate pain. 30 tablet 0  . insulin glargine, 1 Unit Dial, (TOUJEO) 300 UNIT/ML Solostar Pen Inject 35 Units into the skin at bedtime. 4.5 mL 1  . Insulin Pen Needle (BD PEN NEEDLE NANO U/F) 32G X 4 MM MISC Use to inject insulin once daily as directed 100 each 2  . metoprolol succinate (TOPROL-XL) 25 MG 24 hr tablet Take 0.5 tablets (12.5 mg total) by mouth in the morning. 30 tablet 0  . omeprazole (PRILOSEC) 40 MG capsule Take 1 capsule (40 mg total) by mouth in the morning and at bedtime. (Patient taking differently: Take 40 mg by mouth in the morning.) 90 capsule 1  . ONETOUCH ULTRA test strip USE AS DIRECTED 100 each 1  . PRESCRIPTION MEDICATION CPAP: At bedtime    . sitaGLIPtin (JANUVIA) 25 MG tablet  Take 25 mg by mouth in the morning.    . tadalafil, PAH, (ADCIRCA) 20 MG tablet Take 2 tablets (40 mg total) by mouth at bedtime. 180 tablet 3  . torsemide (DEMADEX) 20 MG tablet Take 1 tablet (20 mg total) by mouth daily. (Patient taking differently: Take 20 mg by mouth daily as needed (swelling/water retention).) 30 tablet 11  . venlafaxine (EFFEXOR) 75 MG tablet Take 1 tablet (75 mg total) by  mouth daily. (Patient taking differently: Take 75 mg by mouth in the morning.) 90 tablet 0  . vitamin B-12 (CYANOCOBALAMIN) 1000 MCG tablet Take 1,000 mcg by mouth in the morning and at bedtime.     No current facility-administered medications for this encounter.    Allergies:   Penicillins, Sulfa antibiotics, and Uptravi [selexipag]   Social History:  The patient  reports that she has never smoked. She has never used smokeless tobacco. She reports that she does not drink alcohol and does not use drugs.   Family History:  The patient's family history includes CAD in her maternal grandmother and mother; Heart disease in her father.   ROS:  Please see the history of present illness.   All other systems are personally reviewed and negative.   Vitals:   01/21/21 1023  BP: (!) 164/98  Pulse: (!) 56  SpO2: 95%  Weight: 109 kg (240 lb 6.4 oz)    Wt Readings from Last 3 Encounters:  01/21/21 109 kg (240 lb 6.4 oz)  01/18/21 108 kg (238 lb)  12/27/20 108.9 kg (240 lb)   EKG: sinus bradycardia 59 bpm  PHYSICAL EXAM: General:  Well appearing. No respiratory difficulty HEENT: normal Neck: supple. JVD 8 cm Carotids 2+ bilat; no bruits. No lymphadenopathy or thyromegaly appreciated. Cor: PMI nondisplaced. Regular rhythm, slow rate. No rubs, gallops or murmurs. Lungs: clear Abdomen: soft, nontender, nondistended. No hepatosplenomegaly. No bruits or masses. Good bowel sounds. Extremities: no cyanosis, clubbing, rash, edema Neuro: alert & oriented x 3, cranial nerves grossly intact. moves all 4  extremities w/o difficulty. Affect pleasant.    Recent Labs: 01/27/2020: B Natriuretic Peptide 450.6 12/06/2020: TSH 1.283 12/08/2020: Magnesium 2.1 12/24/2020: ALT 12 12/31/2020: BUN 33; Creatinine, Ser 2.01; Potassium 4.2; Sodium 139 01/18/2021: Hemoglobin 10.0; Platelets 267  Personally reviewed    ASSESSMENT AND PLAN:  1. Paroxysmal AFL - s/p DC-CV 07/10/20 - Recent Admit 4/22 w/ recurrent ALF w/ RVR s/p DCCV - S/p AFL Ablation 01/18/21 - Sinus Brady on EKG today, HR 51 bpm  - Stop Amiodarone  - Continue Toprol XL 12.5 mg daily. - previously on coumadin, now on Eliquis given prior difficulty regulating INR. Denies abnormal bleeding.   2. Pulmonary HTN (severe) with cor pulmonale - Suspect combination of WHO Group 1 & 3 disease - VQ 07/07/19: No PE - RV function remains normal on echo despite severe PAH - PFTs with significant restrictive lung physiology -PA pressures 12/21 very high but PA pulsativity and cardiac output much higher than would be suspected in true [place] with pressures of this magnitude.  - cMRI showed no evidence of shunt, Qp/Qs=0.94. - Continue O2 supplementation and CPAP. - Continue tadalafil 40 mg and letairis 5 mg (unable to tolerate higher dose of letairis). - [place] 12/21 with PA pressures back up into 90s with elevated PVR.  - Unable to tolerate selexipag currently. Can consider rechallenge in the future.   - Echo 12/05/20 LVEF 60% RV normal - Has completed pulmonary rehab - Plan to stop Amiodarone now post AFL Ablation   3. Chronic Diastolic HF - Stable NYHA III symptoms but volume status up after diuretic reduction made at recent hospital d/c. Has been needing to take PRN lasix qod - Change back to daily torsemide use, 20 mg daily  - Check BMP today and again in 7 days  - Currently off losartan and Jardiance due to recent AKI   4. CKD 4 - Baseline Creatinine 1.9 - recent AKI during hospitalization  4/22 w/ SCr bumping to 2.4  - now off losartan and  jardiance and torsemide changed to PRN - change torsemide back to daily for volume control. Keep off losartan  - BMP today and again in 7 days - she is followed by [facility]  5. Nonobstructive CAD - No s/s angina - Continue statin.   - Off ASA with Eliquis   6. H/o PE 2009 - VQ 11/20 negative. - c/w Eliquis    7. H/o Syncope - Given severity of PAH, suspicion has been that this was related to [facility] but cardiac outputs and PA pulsativity argue against this somewhat unless preload dropped for some reason. She was bradycardic when event occurred, may also be contributing factor.   - No further syncopal episodes or dizziness since  - Zio 12/21. SR with frequent runs of SVT (1547 runs - longest 58 seconds)/ No high-grade arrhythmias or pauses - S/p AFL Ablation 6/22 - EKG today shows bradycardia, 51 bpm. Stop amiodarone   8. HTN - BP elevated 763R systolic but usually runs 120s-130s at home, per pt report - will have RN recheck BP next week when she returns for f/u BMP  - if SBPs still >140, will add hydralazine 12.5 tid. Bradycardia limits titration of  blocker. No ARB/Spiro w/ CKD.   RN visit for BP check+ f/u BMP in 1 week. Keep f/u w/ Dr. [name] in 6 weeks   Signed, Lyda Jester, PA-C  01/21/2021 10:33 AM  Advanced Heart Failure [place]  [phone] (office) [phone] (fax)

## 2021-01-21 NOTE — Anesthesia Postprocedure Evaluation (Signed)
Anesthesia Post Note  Patient: Ann Flynn  Procedure(s) Performed: A-FLUTTER ABLATION (N/A )     Patient location during evaluation: Cath Lab Anesthesia Type: General Level of consciousness: awake and alert Pain management: pain level controlled Vital Signs Assessment: post-procedure vital signs reviewed and stable Respiratory status: spontaneous breathing, nonlabored ventilation, respiratory function stable and patient connected to nasal cannula oxygen Cardiovascular status: blood pressure returned to baseline and stable Postop Assessment: no apparent nausea or vomiting Anesthetic complications: no   No complications documented.  Last Vitals:  Vitals:   01/18/21 1500 01/18/21 1515  BP: (!) 172/56   Pulse: 61 63  Resp: 17 19  Temp:    SpO2: 97% 98%    Last Pain:  Vitals:   01/21/21 0951  TempSrc:   PainSc: 0-No pain                 Corleen Otwell

## 2021-01-21 NOTE — Anesthesia Preprocedure Evaluation (Signed)
Anesthesia Evaluation  Patient identified by MRN, date of birth, ID band Patient awake    Reviewed: Allergy & Precautions, H&P , NPO status , Patient's Chart, lab work & pertinent test results  Airway Mallampati: II  TM Distance: >3 FB Neck ROM: full    Dental  (+) Dental Advisory Given   Pulmonary sleep apnea ,    breath sounds clear to auscultation       Cardiovascular hypertension, +CHF  + dysrhythmias Atrial Fibrillation  Rhythm:Irregular Rate:Normal  EF 55-60%   Neuro/Psych    GI/Hepatic   Endo/Other  diabetes, Type 2obese  Renal/GU Renal InsufficiencyRenal disease     Musculoskeletal  (+) Arthritis ,   Abdominal   Peds  Hematology   Anesthesia Other Findings   Reproductive/Obstetrics                             Anesthesia Physical Anesthesia Plan  ASA: II  Anesthesia Plan: General   Post-op Pain Management:    Induction: Intravenous  PONV Risk Score and Plan: 3 and Ondansetron and Dexamethasone  Airway Management Planned: Oral ETT  Additional Equipment: None  Intra-op Plan:   Post-operative Plan: Extubation in OR  Informed Consent: I have reviewed the patients History and Physical, chart, labs and discussed the procedure including the risks, benefits and alternatives for the proposed anesthesia with the patient or authorized representative who has indicated his/her understanding and acceptance.     Dental advisory given  Plan Discussed with: CRNA and Surgeon  Anesthesia Plan Comments:         Anesthesia Quick Evaluation

## 2021-01-22 ENCOUNTER — Ambulatory Visit (INDEPENDENT_AMBULATORY_CARE_PROVIDER_SITE_OTHER): Payer: Medicare Other

## 2021-01-22 DIAGNOSIS — E1122 Type 2 diabetes mellitus with diabetic chronic kidney disease: Secondary | ICD-10-CM | POA: Diagnosis not present

## 2021-01-22 DIAGNOSIS — I482 Chronic atrial fibrillation, unspecified: Secondary | ICD-10-CM

## 2021-01-22 DIAGNOSIS — N184 Chronic kidney disease, stage 4 (severe): Secondary | ICD-10-CM

## 2021-01-22 DIAGNOSIS — I5032 Chronic diastolic (congestive) heart failure: Secondary | ICD-10-CM

## 2021-01-22 DIAGNOSIS — Z794 Long term (current) use of insulin: Secondary | ICD-10-CM | POA: Diagnosis not present

## 2021-01-22 MED ORDER — ONETOUCH ULTRA VI STRP: ORAL_STRIP | 2 refills | Status: AC

## 2021-01-22 MED ORDER — ONETOUCH ULTRASOFT LANCETS MISC: 12 refills | Status: AC

## 2021-01-22 MED ORDER — TRULICITY 1.5 MG/0.5ML ~~LOC~~ SOAJ: 1.5000 mg | SUBCUTANEOUS | Status: DC

## 2021-01-22 MED FILL — Fentanyl Citrate Preservative Free (PF) Inj 100 MCG/2ML: INTRAMUSCULAR | Qty: 2 | Status: AC

## 2021-01-22 NOTE — Progress Notes (Signed)
Chronic Care Management Pharmacy Note  01/23/2021 Name:  Ann Flynn MRN:  [id number] DOB:  October 20, 1940  Summary: Patient recently underwent cardiac ablation, which she said went well and she is happy her heart is back in normal rhythm. She is working on getting patient assistance for Eliquis approved, and will bring her documentation to her cardiologist tomorrow. Blood sugars averaged 155 fasting over past week.   Recommendations/Changes made from today's visit: INCREASE Trulicity to 1.5 mg weekly. PAP paperwork updated  STOP Januvia due to duplication in mechanism with GLP-1 agonists   Subjective: Ann Flynn is an 80 y.o. year old female who is a primary patient of Fisher, Kirstie Peri, MD.  The CCM team was consulted for assistance with disease management and care coordination needs.    Engaged with patient by telephone for follow up visit in response to provider referral for pharmacy case management and/or care coordination services.   Consent to Services:  The patient was given information about Chronic Care Management services, agreed to services, and gave verbal consent prior to initiation of services.  Please see initial visit note for detailed documentation.   Patient Care Team: Birdie Sons, MD as PCP - General (Family Medicine) Bensimhon, Shaune Pascal, MD as PCP - Advanced Heart Failure (Cardiology) Constance Haw, MD as PCP - Electrophysiology (Cardiology) Bensimhon, Shaune Pascal, MD as PCP - Cardiology (Cardiology) Germaine Pomfret, [facility] as Pharmacist (Pharmacist) Murlean Iba, MD (Nephrology)  Recent office visits: 12/14/2020 Dr.Fisher MD (PCP)- Omeprazole 40 mg change from daily to 2 times daily 11/27/2020 Dr Caryn Section MD (PCP) - Started prednisone 20 mg one tablet twice a day for 7 days, Change Hydrocodone-Acetaminophen from 5-325 mg to 7.5-325 mg PRN.  Recent consult visits: 01/21/21: Patient presented to Lyda Jester, PA-C (Heart Failure) for follow-up.  Stop Amiodarone, Continue to hold losartan, Jardiance.  12/27/20: Patient presented to Dr. Curt Bears (cardiology) for follow-up. A-flutter ablation on 6/3.  12/26/20: Patient presented to Dr. Candiss Norse (Nephrology) for follow-up. No medication changes made.  12/05/2020 Dr [name] MD (Cardiology)  11/26/2020 Audry Riles RPH-CPP (Heart Failure Clinic) decreased Uptravi to 400 mcg BID.  Hospital visits: Medication Reconciliation was completed by comparing discharge summary, patient's EMR and Pharmacy list, and upon discussion with patient.   Admitted to the hospital on 12/06/2020 due to Atrial flutter with rapid ventricular response. Discharge date was 12/09/2020. Discharged from [facility]?Medications Started at [facility] Discharge:?? -started Amiodarone HCI 200 mg daily  -Eliquis 5 mg BID   Medication Changes at Hospital Discharge: -Changed Coumadin to Eliquis 5 mg Metoprolol change to 0.5 tablet once daily Change torsemide 20 mg to prn Change Lantus dose from 45 units to 30 units nightly Medications Discontinued at Hospital Discharge: -Losartan on hold  Jardiance on hold due to AKI   Medications that remain the same after Hospital Discharge:??  -All other medications will remain the same.     Admitted to the hospital on 11/23/2020 due to Sciatic nerve pain. Discharge date was 11/23/2020. Discharged from [facility]?Medications Started at [facility] Discharge:?? -started Hydrocodone-acetaminophen 5-325 mg PRN   Medication Changes at Hospital Discharge: -Changed None   Medications Discontinued at Hospital Discharge: -Stopped None    Medications that remain the same after Hospital Discharge:??  -All other medications will remain the same.  Objective:  Lab Results  Component Value Date   CREATININE 2.10 (H) 01/21/2021   BUN 44 (H)  01/21/2021   GFRNONAA 24 (L) 01/21/2021   GFRAA 29 (L) 07/24/2020   NA 141 01/21/2021   K 4.6  01/21/2021   CALCIUM 8.4 (L) 01/21/2021   CO2 30 01/21/2021   GLUCOSE 162 (H) 01/21/2021    Lab Results  Component Value Date/Time   HGBA1C 7.6 (A) 10/23/2020 10:56 AM   HGBA1C 7.0 (A) 07/24/2020 11:19 AM   HGBA1C 7.9 (H) 03/24/2020 06:15 AM   HGBA1C 7.2 (H) 03/28/2019 04:11 PM    Last diabetic Eye exam:  Lab Results  Component Value Date/Time   HMDIABEYEEXA Retinopathy (A) 09/28/2019 12:00 AM    Last diabetic Foot exam: No results found for: HMDIABFOOTEX   Lab Results  Component Value Date   CHOL 104 01/18/2020   HDL 43 01/18/2020   LDLCALC 46 01/18/2020   TRIG 68 01/18/2020   CHOLHDL 2.4 01/18/2020    Hepatic Function Latest Ref Rng & Units 12/24/2020 12/06/2020 07/24/2020  Total Protein 6.5 - 8.1 g/dL 5.4(L) 5.7(L) 5.8(L)  Albumin 3.5 - 5.0 g/dL 3.1(L) 3.3(L) 3.8  AST 15 - 41 U/L 10(L) 26 8  ALT 0 - 44 U/L 12 41 8  Alk Phosphatase 38 - 126 U/L 70 54 79  Total Bilirubin 0.3 - 1.2 mg/dL 0.5 0.6 0.2    Lab Results  Component Value Date/Time   TSH 1.283 12/06/2020 05:09 PM    CBC Latest Ref Rng & Units 01/18/2021 12/09/2020 12/08/2020  WBC 4.0 - 10.5 K/uL 4.9 9.6 10.9(H)  Hemoglobin 12.0 - 15.0 g/dL 10.0(L) 9.5(L) 10.3(L)  Hematocrit 36.0 - 46.0 % 33.2(L) 30.2(L) 33.2(L)  Platelets 150 - 400 K/uL 267 164 178    No results found for: VD25OH  Clinical ASCVD: Yes  The ASCVD Risk score Mikey Bussing DC Jr., et al., 2013) failed to calculate for the following reasons:   The patient has a prior MI or stroke diagnosis    Depression screen [facility] 2/9 07/24/2020 02/14/2020 10/11/2019  Decreased Interest _0 Down, Depressed, Hopeless 1 0 1  PHQ - 2 Score _1 Altered sleeping 1 0 1  Tired, decreased energy _2 Change in appetite _3 Feeling bad or failure about yourself  1 0 1  Trouble concentrating 0 0 0  Moving slowly or fidgety/restless 0 0 0  Suicidal thoughts 0 0 0  PHQ-9 Score _4 Difficult doing work/chores Somewhat difficult Not difficult at all Somewhat  difficult  Some recent data might be hidden    Social History   Tobacco Use  Smoking Status Never Smoker  Smokeless Tobacco Never Used   BP Readings from Last 3 Encounters:  01/21/21 (!) 164/98  01/18/21 (!) 172/56  12/27/20 (!) 144/72   Pulse Readings from Last 3 Encounters:  01/21/21 (!) 56  01/18/21 63  12/27/20 60   Wt Readings from Last 3 Encounters:  01/21/21 240 lb 6.4 oz (109 kg)  01/18/21 238 lb (108 kg)  12/27/20 240 lb (108.9 kg)   BMI Readings from Last 3 Encounters:  01/21/21 40.00 kg/m  01/18/21 39.61 kg/m  12/27/20 39.94 kg/m    Assessment/Interventions: Review of patient past medical history, allergies, medications, health status, including review of consultants reports, laboratory and other test data, was performed as part of comprehensive evaluation and provision of chronic care management services.   SDOH:  (Social Determinants of Health) assessments and interventions performed: Yes SDOH Interventions   Flowsheet Row Most Recent Value  SDOH Interventions  Financial Strain Interventions Other (Comment)  [PAP]     SDOH Screenings   Alcohol Screen: Low Risk   . Last Alcohol Screening Score (AUDIT): 0  Depression (PHQ2-9): Medium Risk  . PHQ-2 Score: 7  Financial Resource Strain: High Risk  . Difficulty of Paying Living Expenses: Hard  Food Insecurity: Not on file  Housing: Not on file  Physical Activity: Not on file  Social Connections: Not on file  Stress: Not on file  Tobacco Use: Low Risk   . Smoking Tobacco Use: Never Smoker  . Smokeless Tobacco Use: Never Used  Transportation Needs: No Transportation Needs  . Lack of Transportation (Medical): No  . Lack of Transportation (Non-Medical): No    CCM Care Plan  Allergies  Allergen Reactions  . Penicillins Swelling    Facial swelling Did it involve swelling of the face/tongue/throat, SOB, or low BP? Yes Did it involve sudden or severe rash/hives, skin peeling, or any reaction on  the inside of your mouth or nose? No Did you need to seek medical attention at a hospital or doctor's office? No When did it last happen?10 + years If all above answers are "NO", may proceed with cephalosporin use.   . Sulfa Antibiotics Rash  . Malvin Johns [Selexipag] Other (See Comments)    headaches    Medications Reviewed Today    Reviewed by Germaine Pomfret, [facility] (Pharmacist) on 01/22/21 at 1334  Med List Status: <None>  Medication Order Taking? Sig Documenting Provider Last Dose Status Informant  acetaminophen (TYLENOL) 325 MG tablet [id number]  Take 650 mg by mouth in the morning and at bedtime. [provider]  Active Self  ambrisentan (LETAIRIS) 5 MG tablet [id number] Yes Take 5 mg by mouth at bedtime. [provider] Taking Active Self  apixaban (ELIQUIS) 5 MG TABS tablet [id number] Yes Take 1 tablet (5 mg total) by mouth 2 (two) times daily. Kayleen Memos, DO Taking Active Self  atorvastatin (LIPITOR) 80 MG tablet [id number] Yes Take 1 tablet (80 mg total) by mouth daily.  Patient taking differently: Take 80 mg by mouth in the morning.   Trinna Post, PA-C Taking Active Self  Cholecalciferol (VITAMIN D3) 50 MCG (2000 UT) TABS [id number]  Take 2,000 Units by mouth in the morning and at bedtime. [provider]  Active Self  docusate sodium (COLACE) 100 MG capsule [id number]  Take 100 mg by mouth daily as needed (constipation.).  [provider]  Active Self           Med Note Sharene Butters   Tue Jan 15, 2021 11:15 AM) On hand       Patient taking differently:      Discontinued 01/22/21 1327 (Dose change)            Med Note Michaelle Birks, Yoko Mcgahee A   Thu Nov 22, 2020  3:22 PM) Patient receives through [place] through December 2022  Dulaglutide (TRULICITY) 1.5 CX/4.4YJ [facility] [id number] Yes Inject 1.5 mg into the skin once a week. Patient receives through [employer] Patient Assistance through December 2022 Birdie Sons, MD  Active   glucose blood [facility] ULTRA) test strip [id number]  Use to check blood sugar once daily as instructed Birdie Sons, MD  Active   HYDROcodone-acetaminophen [facility]) 7.5-325 MG tablet [id number]  Take 1 tablet by mouth every 6 (six) hours as needed for moderate pain. Birdie Sons, MD  Active Self  Med Note Sharene Butters   Tue Jan 15, 2021 11:16 AM) On hand  insulin glargine, 1 Unit Dial, (TOUJEO) 300 UNIT/ML Solostar Pen [id number] Yes Inject 35 Units into the skin at bedtime. Birdie Sons, MD Taking Active Self  Insulin Pen Needle (BD PEN NEEDLE NANO U/F) 32G X 4 MM MISC [id number]  Use to inject insulin once daily as directed Birdie Sons, MD  Active Self  Lancets [facility] ULTRASOFT) lancets [id number] Yes Use to check blood sugars once daily as instructed Fisher, Kirstie Peri, MD  Active   metoprolol succinate (TOPROL-XL) 25 MG 24 hr tablet [id number] Yes Take 0.5 tablets (12.5 mg total) by mouth in the morning. Kayleen Memos, DO Taking Active Self  omeprazole (PRILOSEC) 40 MG capsule [id number] Yes Take 1 capsule (40 mg total) by mouth in the morning and at bedtime.  Patient taking differently: Take 40 mg by mouth in the morning.   Birdie Sons, MD Taking Active Self  PRESCRIPTION MEDICATION [id number]  CPAP: At bedtime [provider]  Active Self        Discontinued 01/22/21 1328 (Discontinued by provider)   tadalafil, PAH, (ADCIRCA) 20 MG tablet [id number] Yes Take 2 tablets (40 mg total) by mouth at bedtime. Flora Lipps, MD Taking Active Self  torsemide (DEMADEX) 20 MG tablet [id number] Yes Take 1 tablet (20 mg total) by mouth daily. Lyda Jester M, PA-C Taking Active   venlafaxine [facility]) 75 MG tablet [id number] Yes Take 1 tablet (75 mg total) by mouth daily.  Patient taking differently: Take 75 mg by mouth in the morning.   Birdie Sons, MD Taking Active Self  vitamin B-12 (CYANOCOBALAMIN) 1000 MCG tablet [id number]  Take  1,000 mcg by mouth in the morning and at bedtime. [provider]  Active Self          Patient Active Problem List   Diagnosis Date Noted  . DDD (degenerative disc disease), lumbar 12/14/2020  . A-fib ([place]) 12/06/2020  . Atrial flutter with rapid ventricular response ([place])   . DDD (degenerative disc disease), cervical 11/27/2020  . Syncope and collapse 08/05/2020  . Atrial fibrillation, chronic ([place]) 03/24/2020  . Type 2 diabetes mellitus with stage 4 chronic kidney disease ([place]) 03/24/2020  . Syncope 03/24/2020  . Pulmonary hypertension ([place]) 03/24/2020  . Subdural hematoma, acute ([place]) 03/24/2020  . Pulmonary nodules 01/30/2020  . Chronic pulmonary embolism, unspecified pulmonary embolism type, unspecified whether acute cor pulmonale present ([place]) 10/06/2019  . Chronic respiratory failure with hypoxia ([place]) 10/06/2019  . Morbid (severe) obesity due to excess calories ([place]) 10/06/2019  . Hypertension associated with diabetes ([place]) 10/06/2019  . Hyperlipidemia associated with type 2 diabetes mellitus ([place]) 10/06/2019  . CKD (chronic kidney disease), stage IV ([place]) 04/01/2019  . Allergic rhinitis 03/28/2019  . Arthritis 03/28/2019  . CHF (congestive heart failure) ([place]) 03/28/2019  . Diabetes mellitus without complication ([place]) 99/83/3825  . Sleep apnea 03/28/2019  . Pulmonary hypertension, primary ([place]) 03/28/2019    Immunization History  Administered Date(s) Administered  . Fluad Quad(high Dose 65+) 07/06/2019, 07/24/2020  . Influenza-Unspecified 06/28/2018  . PFIZER(Purple Top)SARS-COV-2 Vaccination 10/17/2019, 11/07/2019, 05/17/2020    Conditions to be addressed/monitored:  Hypertension, Hyperlipidemia, Diabetes, Atrial Fibrillation, Heart Failure and Chronic Kidney Disease  Care Plan : General Pharmacy (Adult)  Updates made by Germaine Pomfret, RPH since 01/23/2021 12:00 AM    Problem: Hypertension, Hyperlipidemia, Diabetes, Atrial Fibrillation, Heart  Failure and Chronic Kidney Disease   Priority: High  Long-Range Goal: Patient-Specific Goal   Start Date: 01/02/2021  Expected End Date: 07/05/2021  This Visit's Progress: On track  Recent Progress: On track  Priority: High  Note:   Current Barriers:  . Unable to independently afford treatment regimen . Unable to maintain control of Diabetes  Pharmacist Clinical Goal(s):  [name] Patient will achieve control of diabetes as evidenced by A1c less than 8% through collaboration with PharmD and provider.   Interventions: . 1:1 collaboration with Birdie Sons, MD regarding development and update of comprehensive plan of care as evidenced by provider attestation and co-signature . Inter-disciplinary care team collaboration (see longitudinal plan of care) . Comprehensive medication review performed; medication list updated in electronic medical record  Heart Failure (Goal: manage symptoms and prevent exacerbations) -Controlled -Last ejection fraction: 55-60% (Date: 12/05/20) -HF type: Diastolic -NYHA Class: III (marked limitation of activity) -AHA HF Stage: C (Heart disease and symptoms present) -Current treatment: . Ambrisentan 5 mg nightly . Metoprolol XL 25 mg 1/2 tablet daily . Tadalafil 20 mg 2 tablets nightly  . Torsemide 20 mg daily -Medications previously tried: Losartan (AKI) -Educated on Proper diuretic administration and potassium supplementation Importance of blood pressure control -Recommended to continue current medication  Hyperlipidemia: (LDL goal < 70) -Controlled -Current treatment: . Atorvastatin 80 mg daily  -Medications previously tried: NA  -Educated on Importance of limiting foods high in cholesterol; -Recommended to continue current medication  Diabetes (A1c goal <8%) -Uncontrolled -Current medications: . Trulicity 2.68 mg weekly  . Toujeo 35 units nightly . Januvia 100 mg daily  -Medications previously tried: Jardiance (AKI)    -Current home glucose  readings . fasting glucose: 116, 175, 205 ([facility]), 165, 142, 158, 154, 132 . post prandial glucose: NA  -Denies hypoglycemic/hyperglycemic symptoms -INCREASE Trulicity to 1.5 mg weekly -STOP Januvia   Atrial Fibrillation (Goal: prevent stroke and major bleeding) -Controlled -CHADSVASC: 6 -Current treatment: . Rate control: Metoprolol XL 25 mg 1/2 tablet daily  . Anticoagulation: Eliquis 5 mg twice daily  -Medications previously tried: NA -Counseled on avoidance of NSAIDs due to increased bleeding risk with anticoagulants; -Recommended to continue current medication  Chronic Kidney Disease Stage 4  -All medications assessed for renal dosing and appropriateness in chronic kidney disease. -Recommended to continue current medication  Patient Goals/Self-Care Activities . Patient will:  - check glucose twice daily, document, and provide at future appointments check blood pressure weekly, document, and provide at future appointments weigh daily, and contact provider if weight gain of greater than 2 pounds in 24 hours  Follow Up Plan: Telephone follow up appointment with care management team member scheduled for:  03/12/2021 at 1:00 PM      Medication Assistance: Application for Eliquis  medication assistance program. in process.  Anticipated assistance start date TBD.  See plan of care for additional detail. Trulicity obtained through [facility] Patient Assistance. Coverage ends Dec 2022.   Patient's preferred pharmacy is:  [facility] - [place], [place] - [address] [place] [address] Phone: [phone] Fax: [phone]  [facility]  ([place], [place] [place], [address] [place], [place] [address] Phone: [phone] Fax: [phone]  Patient decided to: Utilize [facility] for medication synchronization, packaging and  delivery  Care Plan and Follow Up Patient Decision:  Patient agrees to Care Plan and Follow-up.  Plan: Telephone follow up appointment with care management team  member scheduled for:  03/12/2021 at 1:00 PM  Junius Argyle, PharmD, [place] [phone]

## 2021-01-23 NOTE — Patient Instructions (Signed)
Visit Information It was great speaking with you today!  Please let me know if you have any questions about our visit.  Goals Addressed            This Visit's Progress   . Monitor and Manage My Blood Sugar-Diabetes Type 2       Timeframe:  Long-Range Goal Priority:  High Start Date:  01/02/2021                            Expected End Date: 07/05/2021                      Follow Up Date 02/21/2021   - check blood sugar at prescribed times - check blood sugar if I feel it is too high or too low - enter blood sugar readings and medication or insulin into daily log - take the blood sugar meter to all doctor visits    Why is this important?    Checking your blood sugar at home helps to keep it from getting very high or very low.   Writing the results in a diary or log helps the doctor know how to care for you.   Your blood sugar log should have the time, date and the results.   Also, write down the amount of insulin or other medicine that you take.   Other information, like what you ate, exercise done and how you were feeling, will also be helpful.     Notes:        Patient Care Plan: General Pharmacy (Adult)    Problem Identified: Hypertension, Hyperlipidemia, Diabetes, Atrial Fibrillation, Heart Failure and Chronic Kidney Disease   Priority: High    Long-Range Goal: Patient-Specific Goal   Start Date: 01/02/2021  Expected End Date: 07/05/2021  This Visit's Progress: On track  Recent Progress: On track  Priority: High  Note:   Current Barriers:  . Unable to independently afford treatment regimen . Unable to maintain control of Diabetes  Pharmacist Clinical Goal(s):  [name] Patient will achieve control of diabetes as evidenced by A1c less than 8% through collaboration with PharmD and provider.   Interventions: . 1:1 collaboration with Birdie Sons, MD regarding development and update of comprehensive plan of care as evidenced by provider attestation and  co-signature . Inter-disciplinary care team collaboration (see longitudinal plan of care) . Comprehensive medication review performed; medication list updated in electronic medical record  Heart Failure (Goal: manage symptoms and prevent exacerbations) -Controlled -Last ejection fraction: 55-60% (Date: 12/05/20) -HF type: Diastolic -NYHA Class: III (marked limitation of activity) -AHA HF Stage: C (Heart disease and symptoms present) -Current treatment: . Ambrisentan 5 mg nightly . Metoprolol XL 25 mg 1/2 tablet daily . Tadalafil 20 mg 2 tablets nightly  . Torsemide 20 mg daily -Medications previously tried: Losartan (AKI) -Educated on Proper diuretic administration and potassium supplementation Importance of blood pressure control -Recommended to continue current medication  Hyperlipidemia: (LDL goal < 70) -Controlled -Current treatment: . Atorvastatin 80 mg daily  -Medications previously tried: NA  -Educated on Importance of limiting foods high in cholesterol; -Recommended to continue current medication  Diabetes (A1c goal <8%) -Uncontrolled -Current medications: . Trulicity 4.80 mg weekly  . Toujeo 35 units nightly . Januvia 100 mg daily  -Medications previously tried: Jardiance (AKI)    -Current home glucose readings . fasting glucose: 116, 175, 205 ([facility]), 165, 142, 158, 154, 132 . post  prandial glucose: NA  -Denies hypoglycemic/hyperglycemic symptoms -INCREASE Trulicity to 1.5 mg weekly -STOP Januvia   Atrial Fibrillation (Goal: prevent stroke and major bleeding) -Controlled -CHADSVASC: 6 -Current treatment: . Rate control: Metoprolol XL 25 mg 1/2 tablet daily  . Anticoagulation: Eliquis 5 mg twice daily  -Medications previously tried: NA -Counseled on avoidance of NSAIDs due to increased bleeding risk with anticoagulants; -Recommended to continue current medication  Chronic Kidney Disease Stage 4  -All medications assessed for renal dosing  and appropriateness in chronic kidney disease. -Recommended to continue current medication  Patient Goals/Self-Care Activities . Patient will:  - check glucose twice daily, document, and provide at future appointments check blood pressure weekly, document, and provide at future appointments weigh daily, and contact provider if weight gain of greater than 2 pounds in 24 hours  Follow Up Plan: Telephone follow up appointment with care management team member scheduled for:  03/12/2021 at 1:00 PM      Patient agreed to services and verbal consent obtained.   The patient verbalized understanding of instructions, educational materials, and care plan provided today and declined offer to receive copy of patient instructions, educational materials, and care plan.   Junius Argyle, PharmD, [place] [phone]

## 2021-01-28 ENCOUNTER — Encounter (HOSPITAL_COMMUNITY): Payer: Medicare Other

## 2021-01-28 ENCOUNTER — Ambulatory Visit (HOSPITAL_COMMUNITY)
Admission: RE | Admit: 2021-01-28 | Discharge: 2021-01-28 | Disposition: A | Discharge status: Home / Self Care | Payer: Medicare Other | Source: Ambulatory Visit | Attending: Internal Medicine | Admitting: Internal Medicine

## 2021-01-28 ENCOUNTER — Other Ambulatory Visit: Payer: Self-pay

## 2021-01-28 DIAGNOSIS — I5032 Chronic diastolic (congestive) heart failure: Secondary | ICD-10-CM | POA: Insufficient documentation

## 2021-01-28 LAB — BASIC METABOLIC PANEL
Anion gap: 9 (ref 5–15)
BUN: 31 mg/dL — ABNORMAL HIGH (ref 8–23)
CO2: 28 mmol/L (ref 22–32)
Calcium: 8.9 mg/dL (ref 8.9–10.3)
Chloride: 103 mmol/L (ref 98–111)
Creatinine, Ser: 1.8 mg/dL — ABNORMAL HIGH (ref 0.44–1.00)
GFR, Estimated: 28 mL/min — ABNORMAL LOW (ref >60)
Glucose, Bld: 164 mg/dL — ABNORMAL HIGH (ref 70–99)
Potassium: 4.4 mmol/L (ref 3.5–5.1)
Sodium: 140 mmol/L (ref 135–145)

## 2021-02-05 ENCOUNTER — Telehealth: Payer: Self-pay

## 2021-02-05 MED ORDER — APIXABAN 5 MG PO TABS: 5.0000 mg | ORAL_TABLET | Freq: Two times a day (BID) | ORAL | 1 refills | Status: AC

## 2021-02-05 NOTE — Telephone Encounter (Signed)
38f 108.9kg, scr 1.8 01/28/21, lovw/camnitz 12/27/20

## 2021-02-05 NOTE — Telephone Encounter (Signed)
*  STAT* If patient is at the pharmacy, call can be transferred to refill team.   1. Which medications need to be refilled? (please list name of each medication and dose if known) Eliquis    2. Which pharmacy/location (including street and city if local pharmacy) is medication to be sent to? [facility]  3. Do they need a 30 day or 90 day supply? [place]

## 2021-02-08 ENCOUNTER — Telehealth: Payer: Self-pay

## 2021-02-08 ENCOUNTER — Telehealth: Payer: Self-pay | Admitting: Internal Medicine

## 2021-02-08 NOTE — Chronic Care Management (AMB) (Signed)
Chronic Care Management Pharmacy Assistant   Name: Ann Flynn  MRN: [id number] DOB: 05/09/1941   Reason for Encounter: Patient Assistance Follow Up  02/08/2021 - Patient contacted to discuss patient assistance medication Trulicity. Medication was increased from 0.75 mg to 1.5 mg injected into the skin once a week on 01/22/2021. Patent has not received new dose of Trulicity with [employer] Patient assistance program, she is currently using 0.75 mg injection 2 doses once into skin a week. Patient is not having any concerns or side effects from increase dose. Patient stated her blood sugars have improved, recent readings at 144 and 150. Patient informed to contact Junius Argyle, CPP or his assistant when she receives increased dose of Trulicity 1.5 mg from [employer] Patient assistance program.  Updated application sent for Trulicity on 0/60/04 by Junius Argyle, CPP.     Medications: Outpatient Encounter Medications as of 02/08/2021  Medication Sig Note   acetaminophen (TYLENOL) 325 MG tablet Take 650 mg by mouth in the morning and at bedtime.    ambrisentan (LETAIRIS) 5 MG tablet Take 5 mg by mouth at bedtime.    apixaban (ELIQUIS) 5 MG TABS tablet Take 1 tablet (5 mg total) by mouth 2 (two) times daily.    atorvastatin (LIPITOR) 80 MG tablet Take 1 tablet (80 mg total) by mouth daily. (Patient taking differently: Take 80 mg by mouth in the morning.)    Cholecalciferol (VITAMIN D3) 50 MCG (2000 UT) TABS Take 2,000 Units by mouth in the morning and at bedtime.    docusate sodium (COLACE) 100 MG capsule Take 100 mg by mouth daily as needed (constipation.).  01/15/2021: On hand   Dulaglutide (TRULICITY) 1.5 HT/9.7FS SOPN Inject 1.5 mg into the skin once a week. Patient receives through [employer] Patient Assistance through December 2022    glucose blood (ONETOUCH ULTRA) test strip Use to check blood sugar once daily as instructed    HYDROcodone-acetaminophen (NORCO) 7.5-325 MG tablet Take 1  tablet by mouth every 6 (six) hours as needed for moderate pain. 01/15/2021: On hand   insulin glargine, 1 Unit Dial, (TOUJEO) 300 UNIT/ML Solostar Pen Inject 35 Units into the skin at bedtime.    Insulin Pen Needle (BD PEN NEEDLE NANO U/F) 32G X 4 MM MISC Use to inject insulin once daily as directed    Lancets (ONETOUCH ULTRASOFT) lancets Use to check blood sugars once daily as instructed    metoprolol succinate (TOPROL-XL) 25 MG 24 hr tablet Take 0.5 tablets (12.5 mg total) by mouth in the morning.    omeprazole (PRILOSEC) 40 MG capsule Take 1 capsule (40 mg total) by mouth in the morning and at bedtime. (Patient taking differently: Take 40 mg by mouth in the morning.)    PRESCRIPTION MEDICATION CPAP: At bedtime    tadalafil, PAH, (ADCIRCA) 20 MG tablet Take 2 tablets (40 mg total) by mouth at bedtime.    torsemide (DEMADEX) 20 MG tablet Take 1 tablet (20 mg total) by mouth daily.    venlafaxine (EFFEXOR) 75 MG tablet Take 1 tablet (75 mg total) by mouth daily. (Patient taking differently: Take 75 mg by mouth in the morning.)    vitamin B-12 (CYANOCOBALAMIN) 1000 MCG tablet Take 1,000 mcg by mouth in the morning and at bedtime.    No facility-administered encounter medications on file as of 02/08/2021.      Star Rating Drugs: Atorvastatin 80 mg- Last filled 01/31/2021 for 30 day supply at [facility]. Farxiga 5 mg-  Last filled 03/28/2019 for 30 day supply at [facility].(Not currently taking) Trulicity- Last filled 08/28/5518 for 28 day supply at [facility].(PAP) Jardiance 10 mg- Last filled 11/27/2020 for 30 day supply at [facility].(Not currently taking) Losartan 25 mg- Last filled 08/29/2020 for 30 day supply at [facility]. Januvia 25 mg- Last filled 12/17/2020 for 30 day supply at [facility].(Not currently taking)   Pattricia Boss, [place] Pharmacist Assistant [phone]

## 2021-02-08 NOTE — Telephone Encounter (Signed)
Pt is requesting a refill for tadalafil, PAH, (ADCIRCA) 20 MG tablet.  Pharmacy; [facility]  ([place]) - [place], [address]; [phone]

## 2021-02-11 MED ORDER — TADALAFIL (PAH) 20 MG PO TABS: 40.0000 mg | ORAL_TABLET | Freq: Every day | ORAL | 0 refills | Status: DC

## 2021-02-11 NOTE — Telephone Encounter (Signed)
Patient is past due for appt. Appt scheduled for 03/13/2021. Rx for Tadalafil has been sent to preferred pharmacy. Nothing further needed at this time.

## 2021-02-13 ENCOUNTER — Telehealth (HOSPITAL_COMMUNITY): Payer: Self-pay | Admitting: Pharmacy Technician

## 2021-02-13 NOTE — Telephone Encounter (Signed)
Advanced Heart Failure Patient Advocate Encounter  I received part of a [employer] assistance application. Went ahead and sent in provider portion. Will see if patient sent in her portion already.

## 2021-02-19 ENCOUNTER — Telehealth: Payer: Self-pay

## 2021-02-19 NOTE — Progress Notes (Signed)
Chronic Care Management Pharmacy Assistant   Name: Ann Flynn  MRN: [id number] DOB: Aug 19, 1940   Reason for Encounter: Medication Review/Medication Coordination Call.   Recent office visits:  No recent Office Visit  Recent consult visits:  No recent [facility] visits:  None in previous 6 months  Medications: Outpatient Encounter Medications as of 02/19/2021  Medication Sig Note   acetaminophen (TYLENOL) 325 MG tablet Take 650 mg by mouth in the morning and at bedtime.    ambrisentan (LETAIRIS) 5 MG tablet Take 5 mg by mouth at bedtime.    apixaban (ELIQUIS) 5 MG TABS tablet Take 1 tablet (5 mg total) by mouth 2 (two) times daily.    atorvastatin (LIPITOR) 80 MG tablet Take 1 tablet (80 mg total) by mouth daily. (Patient taking differently: Take 80 mg by mouth in the morning.)    Cholecalciferol (VITAMIN D3) 50 MCG (2000 UT) TABS Take 2,000 Units by mouth in the morning and at bedtime.    docusate sodium (COLACE) 100 MG capsule Take 100 mg by mouth daily as needed (constipation.).  01/15/2021: On hand   Dulaglutide (TRULICITY) 1.5 KD/3.2IZ SOPN Inject 1.5 mg into the skin once a week. Patient receives through [employer] Patient Assistance through December 2022    glucose blood (ONETOUCH ULTRA) test strip Use to check blood sugar once daily as instructed    HYDROcodone-acetaminophen (NORCO) 7.5-325 MG tablet Take 1 tablet by mouth every 6 (six) hours as needed for moderate pain. 01/15/2021: On hand   insulin glargine, 1 Unit Dial, (TOUJEO) 300 UNIT/ML Solostar Pen Inject 35 Units into the skin at bedtime.    Insulin Pen Needle (BD PEN NEEDLE NANO U/F) 32G X 4 MM MISC Use to inject insulin once daily as directed    Lancets (ONETOUCH ULTRASOFT) lancets Use to check blood sugars once daily as instructed    metoprolol succinate (TOPROL-XL) 25 MG 24 hr tablet Take 0.5 tablets (12.5 mg total) by mouth in the morning.    omeprazole (PRILOSEC) 40 MG capsule Take 1 capsule  (40 mg total) by mouth in the morning and at bedtime. (Patient taking differently: Take 40 mg by mouth in the morning.)    PRESCRIPTION MEDICATION CPAP: At bedtime    tadalafil, PAH, (ADCIRCA) 20 MG tablet Take 2 tablets (40 mg total) by mouth at bedtime.    torsemide (DEMADEX) 20 MG tablet Take 1 tablet (20 mg total) by mouth daily.    venlafaxine (EFFEXOR) 75 MG tablet Take 1 tablet (75 mg total) by mouth daily. (Patient taking differently: Take 75 mg by mouth in the morning.)    vitamin B-12 (CYANOCOBALAMIN) 1000 MCG tablet Take 1,000 mcg by mouth in the morning and at bedtime.    No facility-administered encounter medications on file as of 02/19/2021.    Care Gaps: Per patient chart, patient is due for the following care gaps:Hepatitis C screening, Tetanus,Shingrix vaccine, PNA vaccine, COVID vaccine and Ophthalmolgy exam.   Star Rating Drugs: Atorvastatin 80 mg- Last filled 01/31/2021 for 30 day supply at [facility]. Farxiga 5 mg- Last filled 03/28/2019 for 30 day supply at [facility].(Not currently taking) Trulicity- Last filled 09/11/5807 for 28 day supply at [facility].(PAP) Jardiance 10 mg- Last filled 11/27/2020 for 30 day supply at [facility].(Not currently taking) Losartan 25 mg- Last filled 08/29/2020 for 30 day supply at [facility]. Januvia 25 mg- Last filled 12/17/2020 for 30 day supply at [facility].(Not currently taking)   Reviewed  chart for medication changes ahead of medication coordination call.  BP Readings from Last 3 Encounters:  01/21/21 (!) 164/98  01/18/21 (!) 172/56  12/27/20 (!) 144/72    Lab Results  Component Value Date   HGBA1C 7.6 (A) 10/23/2020     Patient obtains medications through Adherence Packaging  30 Days   Last adherence delivery included:  Atorvastatin 80 mg one tablet daily - Breakfast  Venlafaxine 75 mg one tablet daily - Breakfast   Toujeo U300 Inject 35 units into the skin daily  Omeprazole 40 mg  one capsule twice Daily - Breakfast, bedtime BD Pen Needle Nano - Daily Onetouch ultra test strip Onetouch ultra soft lancet Torsemide 20 mg one tablet daily - Breakfast Metoprolol 25 mg 1/2 tablet daily -Breakfast  Patient declined medications last month: Losartan 25 mg one tablet daily - Bedtime (Nephrology [facility] hold losartan) Warfarin 3 mg Daily - Vials (Not taking) Trulicity 1.5 mg Inject 1.5 mg into the skin once a week (patient receiving Trulicity  from [employer])  Patient is due for next adherence delivery on: 02/28/2021. Called patient and reviewed medications and coordinated delivery.  This delivery to include: Atorvastatin 80 mg one tablet daily - Breakfast  Venlafaxine 75 mg one tablet daily - Breakfast   Toujeo U300 Inject 35 units into the skin daily  Omeprazole 40 mg one capsule twice Daily - Breakfast, bedtime Torsemide 20 mg one tablet daily - Breakfast Metoprolol 25 mg 1/2 tablet daily -Breakfast  Patient declined the following medications: Ambrisentan 5 mg 1 tablet daily at bedtime - (patient states she receives from specialist clinic) Tadalafil 20 mg 2 tablets daily at bedtime (patient states she receives from specialist clinic) BD Pen Needle Nano - Daily (Adequate supply)  Onetouch ultra test strip (Adequate supply)  Onetouch ultra soft lancet (Adequate supply)  Losartan 25 mg one tablet daily - Bedtime (Nephrology [facility] hold losartan) Warfarin 3 mg Daily - Vials (Not taking) Trulicity 1.5 mg Inject 1.5 mg into the skin once a week (patient receiving Trulicity  from [employer]) Eliquis 5 mg 1 tablet 2 times daily (Patient receiving patient assistance)  Patient needs refills for Toujeo U300.  Confirmed delivery date of 02/28/2021, advised patient that pharmacy will contact them the morning of delivery.  Patient states she believes she has COVID.Patient reports she  took a home COVID test and "both red lines came up meaning positive  according the the instruction on the box".Patient states she is taking Tylenol and night quill.Patient reports symptoms of congestion,cough and tiredness. Informed patient to stay hydrated with plenty of water, and try to rest as much as she can.Asked patient if she feels she needs to have a telephone appointment with her PCP. Patient states she is going to rest and if she does not improve she will reach out to her PCP office. Notified Clinical Pharmacist.   [place] Pharmacist Assistant [phone]

## 2021-02-20 ENCOUNTER — Telehealth: Payer: Self-pay | Admitting: Family Medicine

## 2021-02-20 ENCOUNTER — Ambulatory Visit: Payer: Medicare Other | Admitting: Family Medicine

## 2021-02-20 DIAGNOSIS — I509 Heart failure, unspecified: Secondary | ICD-10-CM | POA: Diagnosis not present

## 2021-02-20 DIAGNOSIS — I272 Pulmonary hypertension, unspecified: Secondary | ICD-10-CM | POA: Diagnosis not present

## 2021-02-20 DIAGNOSIS — J9611 Chronic respiratory failure with hypoxia: Secondary | ICD-10-CM | POA: Diagnosis not present

## 2021-02-20 DIAGNOSIS — K219 Gastro-esophageal reflux disease without esophagitis: Secondary | ICD-10-CM

## 2021-02-20 DIAGNOSIS — F32A Depression, unspecified: Secondary | ICD-10-CM

## 2021-02-20 MED ORDER — METOPROLOL SUCCINATE ER 25 MG PO TB24: 12.5000 mg | ORAL_TABLET | Freq: Every morning | ORAL | 0 refills | Status: DC

## 2021-02-20 NOTE — Telephone Encounter (Signed)
Pharmacy faxed refill request for the following medications:  metoprolol succinate (TOPROL-XL) 25 MG 24 hr tablet    Please advise.

## 2021-02-28 ENCOUNTER — Other Ambulatory Visit: Payer: Self-pay | Admitting: Family Medicine

## 2021-02-28 DIAGNOSIS — N184 Chronic kidney disease, stage 4 (severe): Secondary | ICD-10-CM

## 2021-02-28 DIAGNOSIS — E1122 Type 2 diabetes mellitus with diabetic chronic kidney disease: Secondary | ICD-10-CM

## 2021-03-04 ENCOUNTER — Telehealth: Payer: Self-pay | Admitting: *Deleted

## 2021-03-04 NOTE — Chronic Care Management (AMB) (Signed)
  Care Management   Note  03/04/2021 Name: Ann Flynn MRN: [id number] DOB: 22-Apr-1941  Ann Flynn is a 80 y.o. year old female who is a primary care patient of Caryn Section, Kirstie Peri, MD and is actively engaged with the care management team. I reached out to Augusto Gamble by phone today to assist with re-scheduling a follow up visit with the Pharmacist  Follow up plan: Unsuccessful telephone outreach attempt made. A HIPAA compliant phone message was left for the patient providing contact information and requesting a return call.  The care management team will reach out to the patient again over the next 7 days.  If patient returns call to provider office, please advise to call [place] at [phone].  [facility] Management

## 2021-03-05 ENCOUNTER — Ambulatory Visit: Payer: Medicare Other | Admitting: Family Medicine

## 2021-03-05 ENCOUNTER — Encounter: Payer: Self-pay | Admitting: Cardiology

## 2021-03-05 ENCOUNTER — Ambulatory Visit: Payer: Medicare Other | Admitting: Cardiology

## 2021-03-05 ENCOUNTER — Other Ambulatory Visit: Payer: Self-pay

## 2021-03-05 VITALS — BP 144/64 | HR 74 | Ht 65.0 in | Wt 234.6 lb

## 2021-03-05 DIAGNOSIS — I483 Typical atrial flutter: Secondary | ICD-10-CM

## 2021-03-05 NOTE — Progress Notes (Signed)
Electrophysiology Office Note   Date:  03/05/2021   ID:  Ann Flynn, DOB April 27, 1941, MRN [id number]  PCP:  Birdie Sons, MD  Cardiologist:  [facility] Primary Electrophysiologist:  Quetzaly Ebner Meredith Leeds, MD    Chief Complaint: atrial flutter   History of Present Illness: Ann Flynn is a 80 y.o. female who is being seen today for the evaluation of atrial flutter at the request of Fisher, Kirstie Peri, MD. Presenting today for electrophysiology evaluation.  She has a history significant for morbid obesity, type 2 diabetes, OSA on CPAP, stage IV CKD, PAD, CVA, carotid surgery in 2010, PE in 2009, and pulmonary arterial hypertension.  He was admitted to the hospital 12/06/2020 with increasing shortness of breath and was found to be in atrial flutter.  She is now status post atrial flutter ablation on 01/18/2021.  Today, denies symptoms of palpitations, chest pain, shortness of breath, orthopnea, PND, lower extremity edema, claudication, dizziness, presyncope, syncope, bleeding, or neurologic sequela. The patient is tolerating medications without difficulties.  Since her ablation she has done well.  She has noted no further episodes of atrial fibrillation.  She is able to all of her daily activities without restriction.   Past Medical History:  Diagnosis Date   A-fib ([place])    Anemia    CHF (congestive heart failure) (HCC)    Chronic kidney disease    Diabetes mellitus without complication ([place])    Hypertension    Pulmonary hypertension ([place])    Syncope 07/2020   Past Surgical History:  Procedure Laterality Date   A-FLUTTER ABLATION N/A 01/18/2021   Procedure: A-FLUTTER ABLATION;  Surgeon: Constance Haw, MD;  Location: [facility];  Service: Cardiovascular;  Laterality: N/A;   ABDOMINAL HYSTERECTOMY  1997   ABDOMINAL HYSTERECTOMY     CARDIOVERSION N/A 07/10/2020   Procedure: CARDIOVERSION;  Surgeon: Jolaine Artist, MD;  Location: [facility];  Service:  Cardiovascular;  Laterality: N/A;   CARDIOVERSION N/A 12/07/2020   Procedure: CARDIOVERSION;  Surgeon: Jolaine Artist, MD;  Location: [facility];  Service: Cardiovascular;  Laterality: N/A;   cartiod artery surgery  2010   per patient    CHOLECYSTECTOMY  10/19/2009   masectomy     [place]   RIGHT HEART CATH N/A 07/29/2019   Procedure: RIGHT HEART CATH;  Surgeon: Jolaine Artist, MD;  Location: [facility];  Service: Cardiovascular;  Laterality: N/A;   RIGHT HEART CATH N/A 05/11/2020   Procedure: RIGHT HEART CATH;  Surgeon: Jolaine Artist, MD;  Location: [facility];  Service: Cardiovascular;  Laterality: N/A;   RIGHT HEART CATH N/A 08/07/2020   Procedure: RIGHT HEART CATH;  Surgeon: Jolaine Artist, MD;  Location: [facility];  Service: Cardiovascular;  Laterality: N/A;     Current Outpatient Medications  Medication Sig Dispense Refill   acetaminophen (TYLENOL) 325 MG tablet Take 650 mg by mouth in the morning and at bedtime.     ambrisentan (LETAIRIS) 5 MG tablet Take 5 mg by mouth at bedtime.     apixaban (ELIQUIS) 5 MG TABS tablet Take 1 tablet (5 mg total) by mouth 2 (two) times daily. 180 tablet 1   atorvastatin (LIPITOR) 80 MG tablet Take 1 tablet (80 mg total) by mouth daily. 90 tablet 2   Cholecalciferol (VITAMIN D3) 50 MCG (2000 UT) TABS Take 2,000 Units by mouth in the morning and at bedtime.     docusate sodium (COLACE) 100  MG capsule Take 100 mg by mouth daily as needed (constipation.).      Dulaglutide (TRULICITY) 1.5 ZO/1.0RU SOPN Inject 1.5 mg into the skin once a week. Patient receives through [employer] Patient Assistance through December 2022     glucose blood (ONETOUCH ULTRA) test strip Use to check blood sugar once daily as instructed 100 each 2   HYDROcodone-acetaminophen (NORCO) 7.5-325 MG tablet Take 1 tablet by mouth every 6 (six) hours as needed for moderate pain. 30 tablet 0   Insulin Pen Needle (BD PEN NEEDLE NANO  U/F) 32G X 4 MM MISC Use to inject insulin once daily as directed 100 each 2   Lancets (ONETOUCH ULTRASOFT) lancets Use to check blood sugars once daily as instructed 100 each 12   metoprolol succinate (TOPROL-XL) 25 MG 24 hr tablet Take 0.5 tablets (12.5 mg total) by mouth in the morning. 15 tablet 0   omeprazole (PRILOSEC) 40 MG capsule Take 1 capsule (40 mg total) by mouth in the morning and at bedtime. 90 capsule 1   PRESCRIPTION MEDICATION CPAP: At bedtime     tadalafil, PAH, (ADCIRCA) 20 MG tablet Take 2 tablets (40 mg total) by mouth at bedtime. 180 tablet 0   torsemide (DEMADEX) 20 MG tablet Take 1 tablet (20 mg total) by mouth daily. 30 tablet 11   TOUJEO SOLOSTAR 300 UNIT/ML Solostar Pen INJECT 35 UNITS SUBCUTANEOUSLY EVERYDAY AT BEDTIME 4.5 mL 1   venlafaxine (EFFEXOR) 75 MG tablet Take 1 tablet (75 mg total) by mouth daily. (Patient taking differently: Take 75 mg by mouth in the morning.) 90 tablet 0   vitamin B-12 (CYANOCOBALAMIN) 1000 MCG tablet Take 1,000 mcg by mouth in the morning and at bedtime.     No current facility-administered medications for this visit.    Allergies:   Penicillins, Sulfa antibiotics, and Uptravi [selexipag]   Social History:  The patient  reports that she has never smoked. She has never used smokeless tobacco. She reports that she does not drink alcohol and does not use drugs.   Family History:  The patient's family history includes CAD in her maternal grandmother and mother; Heart disease in her father.   ROS:  Please see the history of present illness.   Otherwise, review of systems is positive for none.   All other systems are reviewed and negative.   PHYSICAL EXAM: VS:  BP (!) 144/64   Pulse 74   Ht 5' 5" (1.651 m)   Wt 234 lb 9.6 oz (106.4 kg)   SpO2 91%   BMI 39.04 kg/m  , BMI Body mass index is 39.04 kg/m. GEN: Well nourished, well developed, in no acute distress  HEENT: normal  Neck: no JVD, carotid bruits, or masses Cardiac: RRR; no  murmurs, rubs, or gallops,no edema  Respiratory:  clear to auscultation bilaterally, normal work of breathing GI: soft, nontender, nondistended, + BS MS: no deformity or atrophy  Skin: warm and dry Neuro:  Strength and sensation are intact Psych: euthymic mood, full affect  EKG:  EKG is ordered today. Personal review of the ekg ordered shows sinus rhythm, rate 74  Recent Labs: 12/06/2020: TSH 1.283 12/08/2020: Magnesium 2.1 12/24/2020: ALT 12 01/18/2021: Hemoglobin 10.0; Platelets 267 01/21/2021: B Natriuretic Peptide 253.3 01/28/2021: BUN 31; Creatinine, Ser 1.80; Potassium 4.4; Sodium 140    Lipid Panel     Component Value Date/Time   CHOL 104 01/18/2020 1155   TRIG 68 01/18/2020 1155   HDL 43 01/18/2020 1155  CHOLHDL 2.4 01/18/2020 1155   LDLCALC 46 01/18/2020 1155     Wt Readings from Last 3 Encounters:  03/05/21 234 lb 9.6 oz (106.4 kg)  01/21/21 240 lb 6.4 oz (109 kg)  01/18/21 238 lb (108 kg)      Other studies Reviewed: Additional studies/ records that were reviewed today include: TTE 12/05/20  Review of the above records today demonstrates:   1. Left ventricular ejection fraction, by estimation, is 55 to 60%. The  left ventricle has normal function. The left ventricle has no regional  wall motion abnormalities. There is mild left ventricular hypertrophy.  Left ventricular diastolic parameters  are consistent with Grade II diastolic dysfunction (pseudonormalization).   2. Right ventricular systolic function is normal. The right ventricular  size is normal.   3. Left atrial size was mild to moderately dilated.   4. Right atrial size was mildly dilated.   5. The mitral valve is normal in structure. Trivial mitral valve  regurgitation. Mild mitral stenosis. Moderate mitral annular  calcification.   6. The aortic valve is normal in structure. There is moderate  calcification of the aortic valve. Aortic valve regurgitation is not  visualized. Mild to moderate aortic  valve sclerosis/calcification is  present, without any evidence of aortic stenosis.   7. Aortic dilatation noted. There is mild dilatation of the aortic root,  measuring 44 mm.   8. The inferior vena cava is normal in size with greater than 50%  respiratory variability, suggesting right atrial pressure of 3 mmHg.    ASSESSMENT AND PLAN:  1.  Typical appearing atrial flutter: Currently on Eliquis with a CHA2DS2-VASc of 6.  She is status post ablation 01/18/2021.  She states that she has been anticoagulated for quite some time.  She no longer needs anticoagulation for atrial flutter.  We Jonnette Nuon discuss this with primary cardiology.    2.  Obstructive sleep apnea: CPAP compliance encouraged  3.  Pulmonary hypertension: Followed by heart failure team  Current medicines are reviewed at length with the patient today.   The patient does not have concerns regarding her medicines.  The following changes were made today: None  Labs/ tests ordered today include:  No orders of the defined types were placed in this encounter.    Disposition:   FU with Kaitlen Redford as needed months  Signed, Paislei Dorval Meredith Leeds, MD  03/05/2021 1:49 PM     [address] [place] [id number] [phone] (office) [phone] (fax)

## 2021-03-07 NOTE — Telephone Encounter (Signed)
Advanced Heart Failure Patient Advocate Encounter   Patient was approved to receive Eliquis from [employer]  Patient ID: [id number] Effective dates: 02/27/21 through 08/17/21

## 2021-03-11 ENCOUNTER — Ambulatory Visit (INDEPENDENT_AMBULATORY_CARE_PROVIDER_SITE_OTHER): Payer: Medicare Other | Admitting: Family Medicine

## 2021-03-11 ENCOUNTER — Other Ambulatory Visit: Payer: Self-pay

## 2021-03-11 ENCOUNTER — Encounter: Payer: Self-pay | Admitting: Family Medicine

## 2021-03-11 VITALS — BP 141/73 | HR 60 | Temp 97.5°F | Resp 18 | Wt 238.4 lb

## 2021-03-11 DIAGNOSIS — N184 Chronic kidney disease, stage 4 (severe): Secondary | ICD-10-CM | POA: Diagnosis not present

## 2021-03-11 DIAGNOSIS — E1122 Type 2 diabetes mellitus with diabetic chronic kidney disease: Secondary | ICD-10-CM | POA: Diagnosis not present

## 2021-03-11 DIAGNOSIS — E1169 Type 2 diabetes mellitus with other specified complication: Secondary | ICD-10-CM

## 2021-03-11 DIAGNOSIS — G4719 Other hypersomnia: Secondary | ICD-10-CM | POA: Diagnosis not present

## 2021-03-11 DIAGNOSIS — Z794 Long term (current) use of insulin: Secondary | ICD-10-CM | POA: Diagnosis not present

## 2021-03-11 DIAGNOSIS — J9611 Chronic respiratory failure with hypoxia: Secondary | ICD-10-CM

## 2021-03-11 DIAGNOSIS — I4891 Unspecified atrial fibrillation: Secondary | ICD-10-CM | POA: Diagnosis not present

## 2021-03-11 DIAGNOSIS — E785 Hyperlipidemia, unspecified: Secondary | ICD-10-CM

## 2021-03-11 DIAGNOSIS — D539 Nutritional anemia, unspecified: Secondary | ICD-10-CM | POA: Diagnosis not present

## 2021-03-11 DIAGNOSIS — I272 Pulmonary hypertension, unspecified: Secondary | ICD-10-CM

## 2021-03-11 LAB — POCT GLYCOSYLATED HEMOGLOBIN (HGB A1C)
Est. average glucose Bld gHb Est-mCnc: 177
Hemoglobin A1C: 7.8 % — AB (ref 4.0–5.6)

## 2021-03-11 NOTE — Progress Notes (Signed)
Established patient visit   Patient: Ann Flynn   DOB: 02/14/1941   80 y.o. Female  MRN: [id number] Visit Date: 03/11/2021  Today's healthcare provider: Lelon Huh, MD   Chief Complaint  Patient presents with   Diabetes    Subjective    HPI Diabetes Mellitus Type II, Follow-up  Lab Results  Component Value Date   HGBA1C 7.8 (A) 03/11/2021   HGBA1C 7.6 (A) 10/23/2020   HGBA1C 7.0 (A) 07/24/2020   Wt Readings from Last 3 Encounters:  03/11/21 238 lb 6.4 oz (108.1 kg)  03/05/21 234 lb 9.6 oz (106.4 kg)  01/21/21 240 lb 6.4 oz (109 kg)   Last seen for diabetes 4 months ago (seen by Carles Collet, PA-C).  Management since then includes restarting Trulicity. She reports good compliance with treatment. She is not having side effects.  Symptoms: Yes fatigue No foot ulcerations  No appetite changes No nausea  No paresthesia of the feet  No polydipsia  No polyuria No visual disturbances   No vomiting     Home blood sugar records: fasting range: 150-170  Episodes of hypoglycemia? No    Current insulin regiment: Toujeo 35 units at bedtime Most Recent Eye Exam: not UTD Current exercise: none Current diet habits: well balanced  Pertinent Labs: Lab Results  Component Value Date   CHOL 104 01/18/2020   HDL 43 01/18/2020   LDLCALC 46 01/18/2020   TRIG 68 01/18/2020   CHOLHDL 2.4 01/18/2020   Lab Results  Component Value Date   NA 140 01/28/2021   K 4.4 01/28/2021   CREATININE 1.80 (H) 01/28/2021   GFRNONAA 28 (L) 01/28/2021   GFRAA 29 (L) 07/24/2020   GLUCOSE 164 (H) 01/28/2021     ---------------------------------------------------------------------------------------------------  She also complains of feeling fatigued and sleepy all the time, even after she first gets up in the morning. She does have auto-titrating CPAP that she has had for several years that she uses every night. She does have follow up with prescribing pulmonologist next month.   She is noted to be mildly anemia.  Lab Results  Component Value Date   WBC 4.9 01/18/2021   HGB 10.0 (L) 01/18/2021   HCT 33.2 (L) 01/18/2021   MCV 100.9 (H) 01/18/2021   PLT 267 01/18/2021        Medications: Outpatient Medications Prior to Visit  Medication Sig   acetaminophen (TYLENOL) 325 MG tablet Take 650 mg by mouth in the morning and at bedtime.   apixaban (ELIQUIS) 5 MG TABS tablet Take 1 tablet (5 mg total) by mouth 2 (two) times daily.   atorvastatin (LIPITOR) 80 MG tablet Take 1 tablet (80 mg total) by mouth daily.   Cholecalciferol (VITAMIN D3) 50 MCG (2000 UT) TABS Take 2,000 Units by mouth in the morning and at bedtime.   Dulaglutide (TRULICITY) 1.5 WI/2.0BT SOPN Inject 1.5 mg into the skin once a week. Patient receives through [employer] Patient Assistance through December 2022   glucose blood (ONETOUCH ULTRA) test strip Use to check blood sugar once daily as instructed   HYDROcodone-acetaminophen (NORCO) 7.5-325 MG tablet Take 1 tablet by mouth every 6 (six) hours as needed for moderate pain.   Insulin Pen Needle (BD PEN NEEDLE NANO U/F) 32G X 4 MM MISC Use to inject insulin once daily as directed   Lancets (ONETOUCH ULTRASOFT) lancets Use to check blood sugars once daily as instructed   metoprolol succinate (TOPROL-XL) 25 MG 24 hr tablet Take 0.5  tablets (12.5 mg total) by mouth in the morning.   omeprazole (PRILOSEC) 40 MG capsule Take 1 capsule (40 mg total) by mouth in the morning and at bedtime.   PRESCRIPTION MEDICATION CPAP: At bedtime   tadalafil, PAH, (ADCIRCA) 20 MG tablet Take 2 tablets (40 mg total) by mouth at bedtime.   torsemide (DEMADEX) 20 MG tablet Take 1 tablet (20 mg total) by mouth daily.   TOUJEO SOLOSTAR 300 UNIT/ML Solostar Pen INJECT 35 UNITS SUBCUTANEOUSLY EVERYDAY AT BEDTIME   venlafaxine (EFFEXOR) 75 MG tablet Take 1 tablet (75 mg total) by mouth daily. (Patient taking differently: Take 75 mg by mouth in the morning.)   vitamin B-12  (CYANOCOBALAMIN) 1000 MCG tablet Take 1,000 mcg by mouth in the morning and at bedtime.   [DISCONTINUED] ambrisentan (LETAIRIS) 5 MG tablet Take 5 mg by mouth at bedtime. (Patient not taking: Reported on 03/11/2021)   [DISCONTINUED] docusate sodium (COLACE) 100 MG capsule Take 100 mg by mouth daily as needed (constipation.).  (Patient not taking: Reported on 03/11/2021)   No facility-administered medications prior to visit.    Review of Systems  Constitutional:  Positive for fatigue. Negative for appetite change, chills and fever.  Respiratory:  Negative for chest tightness and shortness of breath.   Cardiovascular:  Negative for chest pain and palpitations.  Gastrointestinal:  Negative for abdominal pain, nausea and vomiting.  Neurological:  Negative for dizziness and weakness.      Objective    BP (!) 141/73 (BP Location: Left Arm, Patient Position: Sitting, Cuff Size: Large)   Pulse 60   Temp (!) 97.5 F (36.4 C) (Temporal)   Resp 18   Wt 238 lb 6.4 oz (108.1 kg)   SpO2 97% Comment: 3L 02 nasal canula  BMI 39.67 kg/m     Physical Exam   General: Appearance:    Obese female in no acute distress  Eyes:    PERRL, conjunctiva/corneas clear, EOM's intact       Lungs:     Clear to auscultation bilaterally, respirations unlabored  Heart:    Normal heart rate. Irregularly irregular rhythm. No murmurs, rubs, or gallops.    MS:   All extremities are intact.    Neurologic:   Awake, alert, oriented x 3. No apparent focal neurological defect.        Results for orders placed or performed in visit on 03/11/21  POCT HgB A1C  Result Value Ref Range   Hemoglobin A1C 7.8 (A) 4.0 - 5.6 %   Est. average glucose Bld gHb Est-mCnc 177     Assessment & Plan     1. Type 2 diabetes mellitus with stage 4 chronic kidney disease, with long-term current use of insulin (HCC) A1c is creeping up. Is compliant with medications. Discussed options of increase GLP-1 agonist or insulin. She has nearly 3  month supply of 7.2CN Trulicity which she gets through prescription assistance. Will see if we get 34m dose before her next refill is due.   2. Excessive daytime sleepiness She is using auto-titrating CPAP but is several years old. She is going to discuss with her pulmonologist in August to see if she needs a new one.   3. Atrial fibrillation, unspecified type ([place] Rate controlled and on NOAC.   4. Pulmonary hypertension ([place] Stable, continue routine follow up cardiology.   5. CKD (chronic kidney disease), stage IV ([place] Continue routine follow up Dr. SCandiss Norse   6. Hyperlipidemia associated with type 2 diabetes mellitus ([place] She  is tolerating atorvastatin well with no adverse effects.   - Lipid panel  7. Nutritional anemia  - CBC - Iron, TIBC and Ferritin Panel - Vitamin B12  8. Chronic respiratory failure with hypoxia (HCC) Continue on supplemental O2, is to follow up with pulmonary in August.   9. Morbid (severe) obesity due to excess calories ([place]) Expect some improvement with increased dose of Trulicity we are anticipating.        The entirety of the information documented in the History of Present Illness, Review of Systems and Physical Exam were personally obtained by me. Portions of this information were initially documented by the CMA and reviewed by me for thoroughness and accuracy.     Lelon Huh, MD  [facility] [phone] (phone) [phone] (fax)  [place]

## 2021-03-12 ENCOUNTER — Telehealth: Payer: Medicare Other

## 2021-03-12 LAB — LIPID PANEL
Chol/HDL Ratio: 3.5 ratio (ref 0.0–4.4)
Cholesterol, Total: 143 mg/dL (ref 100–199)
HDL: 41 mg/dL (ref >39)
LDL Chol Calc (NIH): 74 mg/dL (ref 0–99)
Triglycerides: 165 mg/dL — ABNORMAL HIGH (ref 0–149)
VLDL Cholesterol Cal: 28 mg/dL (ref 5–40)

## 2021-03-12 LAB — CBC
Hematocrit: 29.3 % — ABNORMAL LOW (ref 34.0–46.6)
Hemoglobin: 9.4 g/dL — ABNORMAL LOW (ref 11.1–15.9)
MCH: 30 pg (ref 26.6–33.0)
MCHC: 32.1 g/dL (ref 31.5–35.7)
MCV: 94 fL (ref 79–97)
Platelets: 238 10*3/uL (ref 150–450)
RBC: 3.13 x10E6/uL — ABNORMAL LOW (ref 3.77–5.28)
RDW: 13.4 % (ref 11.7–15.4)
WBC: 6.2 10*3/uL (ref 3.4–10.8)

## 2021-03-12 LAB — IRON,TIBC AND FERRITIN PANEL
Ferritin: 112 ng/mL (ref 15–150)
Iron Saturation: 13 % — ABNORMAL LOW (ref 15–55)
Iron: 29 ug/dL (ref 27–139)
Total Iron Binding Capacity: 222 ug/dL — ABNORMAL LOW (ref 250–450)
UIBC: 193 ug/dL (ref 118–369)

## 2021-03-12 LAB — VITAMIN B12: Vitamin B-12: >2000 pg/mL — ABNORMAL HIGH (ref 232–1245)

## 2021-03-12 NOTE — Chronic Care Management (AMB) (Signed)
  Care Management   Note  03/12/2021 Name: THOMASA HEIDLER MRN: [id number] DOB: 21-Nov-1940  MALLISSA LORENZEN is a 80 y.o. year old female who is a primary care patient of Caryn Section, Kirstie Peri, MD and is actively engaged with the care management team. I reached out to Augusto Gamble by phone today to assist with re-scheduling a follow up visit with the Pharmacist  Follow up plan: Telephone appointment with care management team member scheduled for:04/12/21  Jamiah Recore  Care Guide, Embedded Care Coordination [place]  Care Management  Direct Dial: [phone]

## 2021-03-13 ENCOUNTER — Encounter: Payer: Self-pay | Admitting: Primary Care

## 2021-03-13 ENCOUNTER — Ambulatory Visit: Payer: Medicare Other | Admitting: Primary Care

## 2021-03-13 ENCOUNTER — Other Ambulatory Visit: Payer: Self-pay

## 2021-03-13 VITALS — BP 118/46 | HR 60 | Temp 96.8°F | Ht 65.0 in | Wt 236.6 lb

## 2021-03-13 DIAGNOSIS — I482 Chronic atrial fibrillation, unspecified: Secondary | ICD-10-CM

## 2021-03-13 DIAGNOSIS — I2782 Chronic pulmonary embolism: Secondary | ICD-10-CM

## 2021-03-13 DIAGNOSIS — R0683 Snoring: Secondary | ICD-10-CM

## 2021-03-13 DIAGNOSIS — G4730 Sleep apnea, unspecified: Secondary | ICD-10-CM

## 2021-03-13 DIAGNOSIS — I272 Pulmonary hypertension, unspecified: Secondary | ICD-10-CM | POA: Diagnosis not present

## 2021-03-13 NOTE — Assessment & Plan Note (Signed)
-  Patient reports increased symptoms of fatigue. She reports compliance with CPAP. Airview download in March 2022 showed 77% compliance with use, however, average usage was 3 hours 25mn. CPAP pressure 11cm h20; residual AHI 0.7. Her sleep apnea appears to be well controlled on current CPAP setting. Her current machine is > 189year old, we have placed DME order with [facility] for new machine. Follow-up in 3 months

## 2021-03-13 NOTE — Assessment & Plan Note (Addendum)
-  WHO GROUP III d/t OSA/OHS, possible component of group I and group IV. For the most part, she is compliant with supplemental oxygen and CPAP. Reinforced importance of wearing CPAP every night for minimum of 4-6 hours or longer. She is maintained on Torsemide 47m daily, Letairis 527mdaily and Tadalafil 4072mt bedtime.  She gets Letairis through a graProduction assistant, radioollowing with cardiology and pulmonary. Needs visit with Dr. KasMortimer Friesast seen in 2020.

## 2021-03-13 NOTE — Assessment & Plan Note (Signed)
-  Underwent cardiac ablation in June 2022 for aflutter

## 2021-03-13 NOTE — Patient Instructions (Addendum)
  Sleep apnea: - Reinforce importance of wearing CPAP every night for minum 4-6 hours or longer  - Your [employer] from [place] showed that your sleep apnea was well controlled on current CPAP pressure 11cm h20. Since your machine is >80 year old I would recommend you get a new one. If you do not want to purchase recommend you bring SD card from your machine to [facility] and they can pull a more recent download for use to review.   Pulmonary HTN: - Continue Torsemide 63m daily, Letairis 538mdaily and Tadalafil 4081mt bedtime   Chronic respiratory failure:  - Continue to wear oxygen 3L 24/7  Orders: - Simple walk test today on 3L oxygen  - Needs new CPAP machine > 10 27ar old (DMEStovallsleep study in 2016 showed severe OSA  Follow-up: - 2-3 months with Dr. KasMortimer Friesust see MD only)

## 2021-03-13 NOTE — Progress Notes (Signed)
_0  ID: Ann Flynn, female    DOB: 21-Jul-1941, 80 y.o.   MRN: [id number]  No chief complaint on file.   Referring provider: Birdie Sons, MD  HPI: 80 year old female. PMH significant for sleep apnea, chronic respiratory failure, diabetes, CKD, CHF, moderate pulmonary hypertension, pulmonary embolism in 2009, history of stroke, morbid obesity. Patient of Dr. Mortimer Fries.  Sleep study in 2016, AHI 29.6.  Patient had a CPAP titration study, titrated to 13 cm H2O with 3 L supplemental oxygen. On indefinite oral anticoagulation.   03/13/2021 Patient presents today for annual follow-up sleep apnea/chronic respiratory failure. She reports increased symptoms of fatigue. She had a sleep study in 2016 that showed severe OSA. Current CPAP pressure is 11cm h20. She has been more fatigued recently. She reports compliance with CPAP use every night. Last [employer] is from March-April 2022, older machines no longer have 3/4G service. Her machine is >33 year old.   She is on lifelong anticoagulation d/t hx afib and DVT/PE.  She takes Torsemide 59m daily, letairis 555mdaily (filled through grant) and Tadalafil 4016mt bedtime for pulmonary hypertension. Since her last visit she had cardiac ablation d/t aflutter back in June 2022. Follows with cardiology and heart failure team.  She does ok walking on flat surfaces at her own pace with rolling walker. She gets winded with long distance or when she is not using her oxygen. She is on 3-4L supplemental oxygen continuously, DME company for her oxygen is [employer]. She completed pulmonary rehab last year which she feels helped some. Denies dizziness, lightheadedness, cough, chest tightness or wheezing.   Allergies  Allergen Reactions   Penicillins Swelling    Facial swelling Did it involve swelling of the face/tongue/throat, SOB, or low BP? Yes Did it involve sudden or severe rash/hives, skin peeling, or any reaction on the inside of your mouth or  nose? No Did you need to seek medical attention at a hospital or doctor's office? No When did it last happen?      10 + years If all above answers are "NO", may proceed with cephalosporin use.    Sulfa Antibiotics Rash   Uptravi [Selexipag] Other (See Comments)    headaches    Immunization History  Administered Date(s) Administered   Fluad Quad(high Dose 65+) 07/06/2019, 07/24/2020   Influenza-Unspecified 06/28/2018   PFIZER(Purple Top)SARS-COV-2 Vaccination 10/17/2019, 11/07/2019, 05/17/2020    Past Medical History:  Diagnosis Date   A-fib ([place]  Anemia    CHF (congestive heart failure) (HCC)    Chronic kidney disease    Diabetes mellitus without complication (HCC[place]inck[place]  Hypertension    Pulmonary hypertension (HCC)    Subdural hematoma, acute ([place]CC[place]/02/2020   Syncope 07/2020    Tobacco History: Social History   Tobacco Use  Smoking Status Never  Smokeless Tobacco Never   Counseling given: Not Answered   Outpatient Medications Prior to Visit  Medication Sig Dispense Refill   acetaminophen (TYLENOL) 325 MG tablet Take 650 mg by mouth in the morning and at bedtime.     ambrisentan (LETAIRIS) 5 MG tablet Take by mouth daily.     apixaban (ELIQUIS) 5 MG TABS tablet Take 1 tablet (5 mg total) by mouth 2 (two) times daily. 180 tablet 1   atorvastatin (LIPITOR) 80 MG tablet Take 1 tablet (80 mg total) by mouth daily. 90 tablet 2   Cholecalciferol (VITAMIN D3) 50 MCG (2000 UT) TABS Take 2,000 Units by mouth in the morning  and at bedtime.     Dulaglutide (TRULICITY) 1.5 IR/4.4RX SOPN Inject 1.5 mg into the skin once a week. Patient receives through [employer] Patient Assistance through December 2022     glucose blood (ONETOUCH ULTRA) test strip Use to check blood sugar once daily as instructed 100 each 2   HYDROcodone-acetaminophen (NORCO) 7.5-325 MG tablet Take 1 tablet by mouth every 6 (six) hours as needed for moderate pain. 30 tablet 0   Insulin Pen Needle (BD PEN NEEDLE  NANO U/F) 32G X 4 MM MISC Use to inject insulin once daily as directed 100 each 2   Lancets (ONETOUCH ULTRASOFT) lancets Use to check blood sugars once daily as instructed 100 each 12   metoprolol succinate (TOPROL-XL) 25 MG 24 hr tablet Take 0.5 tablets (12.5 mg total) by mouth in the morning. 15 tablet 0   omeprazole (PRILOSEC) 40 MG capsule Take 1 capsule (40 mg total) by mouth in the morning and at bedtime. 90 capsule 1   PRESCRIPTION MEDICATION CPAP: At bedtime     tadalafil, PAH, (ADCIRCA) 20 MG tablet Take 2 tablets (40 mg total) by mouth at bedtime. 180 tablet 0   torsemide (DEMADEX) 20 MG tablet Take 1 tablet (20 mg total) by mouth daily. 30 tablet 11   TOUJEO SOLOSTAR 300 UNIT/ML Solostar Pen INJECT 35 UNITS SUBCUTANEOUSLY EVERYDAY AT BEDTIME 4.5 mL 1   venlafaxine (EFFEXOR) 75 MG tablet Take 1 tablet (75 mg total) by mouth daily. (Patient taking differently: Take 75 mg by mouth in the morning.) 90 tablet 0   vitamin B-12 (CYANOCOBALAMIN) 1000 MCG tablet Take 1,000 mcg by mouth in the morning and at bedtime.     No facility-administered medications prior to visit.    Review of Systems  Review of Systems  Constitutional:  Positive for fatigue.  HENT: Negative.    Respiratory:  Negative for cough, chest tightness and wheezing.        Dyspnea on exertion  Cardiovascular:  Positive for leg swelling.       Baseline BLE edema    Physical Exam  BP (!) 118/46 (BP Location: Left Arm, Patient Position: Sitting, Cuff Size: Normal)   Pulse 60   Temp (!) 96.8 F (36 C) (Temporal)   Ht _0  (1.651 m)   Wt 236 lb 9.6 oz (107.3 kg)   SpO2 95%   BMI 39.37 kg/m  Physical Exam Constitutional:      Appearance: Normal appearance.  HENT:     Head: Normocephalic and atraumatic.     Mouth/Throat:     Mouth: Mucous membranes are moist.     Pharynx: Oropharynx is clear.  Cardiovascular:     Rate and Rhythm: Normal rate and regular rhythm.  Pulmonary:     Effort: Pulmonary effort is  normal.     Breath sounds: Normal breath sounds. No wheezing, rhonchi or rales.     Comments: 3L oxygen Musculoskeletal:     Comments: Amb with rolling walker  Skin:    General: Skin is warm and dry.  Neurological:     General: No focal deficit present.     Mental Status: She is alert and oriented to person, place, and time. Mental status is at baseline.  Psychiatric:        Mood and Affect: Mood normal.        Behavior: Behavior normal.        Thought Content: Thought content normal.        Judgment: Judgment normal.  Lab Results:  CBC    Component Value Date/Time   WBC 6.2 03/11/2021 1519   WBC 4.9 01/18/2021 0918   RBC 3.13 (L) 03/11/2021 1519   RBC 3.29 (L) 01/18/2021 0918   HGB 9.4 (L) 03/11/2021 1519   HCT 29.3 (L) 03/11/2021 1519   PLT 238 03/11/2021 1519   MCV 94 03/11/2021 1519   MCH 30.0 03/11/2021 1519   MCH 30.4 01/18/2021 0918   MCHC 32.1 03/11/2021 1519   MCHC 30.1 01/18/2021 0918   RDW 13.4 03/11/2021 1519   LYMPHSABS 1.9 12/06/2020 1213   LYMPHSABS 1.5 07/24/2020 1547   MONOABS 1.0 12/06/2020 1213   EOSABS 0.2 12/06/2020 1213   EOSABS 0.2 07/24/2020 1547   BASOSABS 0.0 12/06/2020 1213   BASOSABS 0.1 07/24/2020 1547    BMET    Component Value Date/Time   NA 140 01/28/2021 1029   NA 143 07/24/2020 1547   K 4.4 01/28/2021 1029   CL 103 01/28/2021 1029   CO2 28 01/28/2021 1029   GLUCOSE 164 (H) 01/28/2021 1029   BUN 31 (H) 01/28/2021 1029   BUN 46 (H) 07/24/2020 1547   CREATININE 1.80 (H) 01/28/2021 1029   CALCIUM 8.9 01/28/2021 1029   GFRNONAA 28 (L) 01/28/2021 1029   GFRAA 29 (L) 07/24/2020 1547    BNP    Component Value Date/Time   BNP 253.3 (H) 01/21/2021 1104    ProBNP No results found for: PROBNP  Imaging: No results found.   Assessment & Plan:   Sleep apnea - Patient reports increased symptoms of fatigue. She reports compliance with CPAP. Airview download in March 2022 showed 77% compliance with use, however, average  usage was 3 hours 14mn. CPAP pressure 11cm h20; residual AHI 0.7. Her sleep apnea appears to be well controlled on current CPAP setting. Her current machine is > 110year old, we have placed DME order with [facility] for new machine. Follow-up in 3 months   Pulmonary hypertension (HCC) - WHO GROUP III d/t OSA/OHS, possible component of group I and group IV. For the most part, she is compliant with supplemental oxygen and CPAP. Reinforced importance of wearing CPAP every night for minimum of 4-6 hours or longer. She is maintained on Torsemide 258mdaily, Letairis 62m67maily and Tadalafil 55m90m bedtime.  She gets Letairis through a granProduction assistant, radiollowing with cardiology and pulmonary. Needs visit with Dr. KasaMortimer Friesst seen in 2020.   Chronic pulmonary embolism, unspecified pulmonary embolism type, unspecified whether acute cor pulmonale present (HCC)[place]osed in [place], she is on chronic anticoagulation which is indefinite - Currently on Eliquis 62mg 46mce daily   Atrial fibrillation, chronic (HCC) Ryegatenderwent cardiac ablation in June 2022 for aflutter     Ann Ehrich7/27/2022

## 2021-03-13 NOTE — Assessment & Plan Note (Addendum)
-  Diagnosed in 2009, she is on chronic anticoagulation which is indefinite - Currently on Eliquis 41m twice daily

## 2021-03-14 ENCOUNTER — Telehealth: Payer: Self-pay | Admitting: *Deleted

## 2021-03-14 NOTE — Telephone Encounter (Signed)
-----  Message from Will Meredith Leeds, MD sent at 03/06/2021  8:46 AM EDT ----- Can we adjust eliquis per note below, thanks. ----- Message ----- From: Jolaine Artist, MD Sent: 03/06/2021   1:25 AM EDT To: Constance Haw, MD  Hey Will. She has remote h/o of PE. Based on Amplify-EXT data I typically leave these patients on low-dose Eliquis (2.5 bid) due to risk of recurrence unless bleeding risk is felt to preclude.   Thanks -dan  ----- Message ----- From: Constance Haw, MD Sent: 03/05/2021   1:59 PM EDT To: Jolaine Artist, MD  I saw Ms. Canning today.  She is doing well and has had no further atrial flutter.  As she is remained in sinus rhythm and has had ablation, she does not need Eliquis any longer.  She does state that she has been on it for quite a while for blood clots.  Wanted to see if you thought it was safe to stop her Eliquis or if she needs to continue.  Thanks.

## 2021-03-14 NOTE — Telephone Encounter (Signed)
Left message to call back

## 2021-03-18 ENCOUNTER — Telehealth: Payer: Self-pay

## 2021-03-18 NOTE — Telephone Encounter (Signed)
LMOVM for pt to return call. Okay for [facility] triage to give patient results.

## 2021-03-18 NOTE — Telephone Encounter (Signed)
Copied from [place] [id number]. Topic: General - Other >> Mar 18, 2021 11:39 AM Tessa Lerner A wrote: Luiz Iron for CRM: Patient would like to be contacted regarding lab results from 03/11/21  Please contact further when possible

## 2021-03-19 DIAGNOSIS — M47816 Spondylosis without myelopathy or radiculopathy, lumbar region: Secondary | ICD-10-CM | POA: Diagnosis not present

## 2021-03-19 DIAGNOSIS — M5416 Radiculopathy, lumbar region: Secondary | ICD-10-CM | POA: Diagnosis not present

## 2021-03-19 DIAGNOSIS — Z79891 Long term (current) use of opiate analgesic: Secondary | ICD-10-CM | POA: Diagnosis not present

## 2021-03-19 DIAGNOSIS — G894 Chronic pain syndrome: Secondary | ICD-10-CM | POA: Diagnosis not present

## 2021-03-19 NOTE — Telephone Encounter (Signed)
Documented in result notes.

## 2021-03-20 ENCOUNTER — Other Ambulatory Visit: Payer: Self-pay

## 2021-03-20 ENCOUNTER — Telehealth: Payer: Self-pay

## 2021-03-20 ENCOUNTER — Encounter (HOSPITAL_COMMUNITY): Payer: Self-pay | Admitting: Internal Medicine

## 2021-03-20 ENCOUNTER — Ambulatory Visit (HOSPITAL_COMMUNITY)
Admission: RE | Admit: 2021-03-20 | Discharge: 2021-03-20 | Disposition: A | Discharge status: Home / Self Care | Payer: Medicare Other | Source: Ambulatory Visit | Attending: Internal Medicine | Admitting: Internal Medicine

## 2021-03-20 VITALS — BP 130/70 | HR 63 | Wt 237.8 lb

## 2021-03-20 DIAGNOSIS — Z794 Long term (current) use of insulin: Secondary | ICD-10-CM | POA: Insufficient documentation

## 2021-03-20 DIAGNOSIS — Z880 Allergy status to penicillin: Secondary | ICD-10-CM | POA: Diagnosis not present

## 2021-03-20 DIAGNOSIS — I483 Typical atrial flutter: Secondary | ICD-10-CM | POA: Diagnosis not present

## 2021-03-20 DIAGNOSIS — Z86711 Personal history of pulmonary embolism: Secondary | ICD-10-CM | POA: Insufficient documentation

## 2021-03-20 DIAGNOSIS — Z7901 Long term (current) use of anticoagulants: Secondary | ICD-10-CM | POA: Insufficient documentation

## 2021-03-20 DIAGNOSIS — E1151 Type 2 diabetes mellitus with diabetic peripheral angiopathy without gangrene: Secondary | ICD-10-CM | POA: Insufficient documentation

## 2021-03-20 DIAGNOSIS — Z8673 Personal history of transient ischemic attack (TIA), and cerebral infarction without residual deficits: Secondary | ICD-10-CM | POA: Insufficient documentation

## 2021-03-20 DIAGNOSIS — Z882 Allergy status to sulfonamides status: Secondary | ICD-10-CM | POA: Insufficient documentation

## 2021-03-20 DIAGNOSIS — Z79899 Other long term (current) drug therapy: Secondary | ICD-10-CM | POA: Insufficient documentation

## 2021-03-20 DIAGNOSIS — Z7722 Contact with and (suspected) exposure to environmental tobacco smoke (acute) (chronic): Secondary | ICD-10-CM | POA: Insufficient documentation

## 2021-03-20 DIAGNOSIS — N184 Chronic kidney disease, stage 4 (severe): Secondary | ICD-10-CM | POA: Diagnosis not present

## 2021-03-20 DIAGNOSIS — I5032 Chronic diastolic (congestive) heart failure: Secondary | ICD-10-CM | POA: Insufficient documentation

## 2021-03-20 DIAGNOSIS — Z9010 Acquired absence of unspecified breast and nipple: Secondary | ICD-10-CM | POA: Insufficient documentation

## 2021-03-20 DIAGNOSIS — Z8249 Family history of ischemic heart disease and other diseases of the circulatory system: Secondary | ICD-10-CM | POA: Insufficient documentation

## 2021-03-20 DIAGNOSIS — I2510 Atherosclerotic heart disease of native coronary artery without angina pectoris: Secondary | ICD-10-CM | POA: Insufficient documentation

## 2021-03-20 DIAGNOSIS — I130 Hypertensive heart and chronic kidney disease with heart failure and stage 1 through stage 4 chronic kidney disease, or unspecified chronic kidney disease: Secondary | ICD-10-CM | POA: Insufficient documentation

## 2021-03-20 DIAGNOSIS — Z6839 Body mass index (BMI) 39.0-39.9, adult: Secondary | ICD-10-CM | POA: Diagnosis not present

## 2021-03-20 DIAGNOSIS — G4733 Obstructive sleep apnea (adult) (pediatric): Secondary | ICD-10-CM | POA: Diagnosis not present

## 2021-03-20 DIAGNOSIS — E1122 Type 2 diabetes mellitus with diabetic chronic kidney disease: Secondary | ICD-10-CM | POA: Insufficient documentation

## 2021-03-20 DIAGNOSIS — I272 Pulmonary hypertension, unspecified: Secondary | ICD-10-CM | POA: Diagnosis not present

## 2021-03-20 DIAGNOSIS — I4892 Unspecified atrial flutter: Secondary | ICD-10-CM | POA: Diagnosis not present

## 2021-03-20 HISTORY — DX: Other cervical disc degeneration at C4-C5 level: M50.321

## 2021-03-20 LAB — CBC
HCT: 30.8 % — ABNORMAL LOW (ref 36.0–46.0)
Hemoglobin: 9.2 g/dL — ABNORMAL LOW (ref 12.0–15.0)
MCH: 29.9 pg (ref 26.0–34.0)
MCHC: 29.9 g/dL — ABNORMAL LOW (ref 30.0–36.0)
MCV: 100 fL (ref 80.0–100.0)
Platelets: 247 10*3/uL (ref 150–400)
RBC: 3.08 MIL/uL — ABNORMAL LOW (ref 3.87–5.11)
RDW: 14.6 % (ref 11.5–15.5)
WBC: 5.7 10*3/uL (ref 4.0–10.5)
nRBC: 0 % (ref 0.0–0.2)

## 2021-03-20 LAB — BASIC METABOLIC PANEL
Anion gap: 7 (ref 5–15)
BUN: 34 mg/dL — ABNORMAL HIGH (ref 8–23)
CO2: 28 mmol/L (ref 22–32)
Calcium: 9.3 mg/dL (ref 8.9–10.3)
Chloride: 106 mmol/L (ref 98–111)
Creatinine, Ser: 1.86 mg/dL — ABNORMAL HIGH (ref 0.44–1.00)
GFR, Estimated: 27 mL/min — ABNORMAL LOW (ref >60)
Glucose, Bld: 123 mg/dL — ABNORMAL HIGH (ref 70–99)
Potassium: 4.8 mmol/L (ref 3.5–5.1)
Sodium: 141 mmol/L (ref 135–145)

## 2021-03-20 LAB — BRAIN NATRIURETIC PEPTIDE: B Natriuretic Peptide: 175.2 pg/mL — ABNORMAL HIGH (ref 0.0–100.0)

## 2021-03-20 NOTE — Patient Instructions (Signed)
Labs done today, your results will be available in MyChart, we will contact you for abnormal readings.  Your physician recommends that you schedule a follow-up appointment in: 4 months  If you have any questions or concerns before your next appointment please send [place] a message through [place] or call our office at [phone].    TO LEAVE A MESSAGE FOR THE NURSE SELECT OPTION 2, PLEASE LEAVE A MESSAGE INCLUDING: YOUR NAME DATE OF BIRTH CALL BACK NUMBER REASON FOR CALL**this is important as we prioritize the call backs  YOU WILL RECEIVE A CALL BACK THE SAME DAY AS LONG AS YOU CALL BEFORE 4:00 PM  milAt the [facility], you and your health needs are our priority. As part of our continuing mission to provide you with exceptional heart care, we have created designated Provider Care Teams. These Care Teams include your primary Cardiologist (physician) and Advanced Practice Providers (APPs- Physician Assistants and Nurse Practitioners) who all work together to provide you with the care you need, when you need it.   You may see any of the following providers on your designated Care Team at your next follow up: Dr Glori Bickers Dr Loralie Champagne Dr Patrice Paradise, NP Lyda Jester, [place] Ginnie Smart Audry Riles, PharmD   Please be sure to bring in all your medications bottles to every appointment.

## 2021-03-20 NOTE — Progress Notes (Signed)
Chronic Care Management Pharmacy Assistant   Name: Ann Flynn  MRN: [id number] DOB: 1940-09-15  Reason for Encounter: Medication Review/Medication Coordination Call   Recent office visits:  03/11/2021 Lelon Huh, MD (PCP Office Visit)- For Diabetes- Ambrisentan 5 mg, Docusate Sodium 100 mg discontinued due to patient not taking; labs ordered  Recent consult visits:  03/13/2021 Geraldo Pitter, NP (Pulmonary) for Loud Snoring- Order for new CPAP machine placed; no medication changes noted; Patient instructed to follow-up in 3 months.  03/05/2021 Meredith Leeds, MD (Cardiology) for Typical Atrial Flutter- No medication changes noted  Hospital visits:  None in previous 6 months  Medications: Outpatient Encounter Medications as of 03/20/2021  Medication Sig   acetaminophen (TYLENOL) 325 MG tablet Take 650 mg by mouth in the morning and at bedtime.   ambrisentan (LETAIRIS) 5 MG tablet Take by mouth daily.   apixaban (ELIQUIS) 5 MG TABS tablet Take 1 tablet (5 mg total) by mouth 2 (two) times daily.   atorvastatin (LIPITOR) 80 MG tablet Take 1 tablet (80 mg total) by mouth daily.   Cholecalciferol (VITAMIN D3) 50 MCG (2000 UT) TABS Take 2,000 Units by mouth in the morning and at bedtime.   Dulaglutide (TRULICITY) 1.5 MO/7.0BE SOPN Inject 1.5 mg into the skin once a week. Patient receives through [employer] Patient Assistance through December 2022   glucose blood (ONETOUCH ULTRA) test strip Use to check blood sugar once daily as instructed   HYDROcodone-acetaminophen (NORCO) 7.5-325 MG tablet Take 1 tablet by mouth every 6 (six) hours as needed for moderate pain.   Insulin Pen Needle (BD PEN NEEDLE NANO U/F) 32G X 4 MM MISC Use to inject insulin once daily as directed   Lancets (ONETOUCH ULTRASOFT) lancets Use to check blood sugars once daily as instructed   metoprolol succinate (TOPROL-XL) 25 MG 24 hr tablet Take 0.5 tablets (12.5 mg total) by mouth in the morning.   omeprazole  (PRILOSEC) 40 MG capsule Take 1 capsule (40 mg total) by mouth in the morning and at bedtime.   PRESCRIPTION MEDICATION CPAP: At bedtime   tadalafil, PAH, (ADCIRCA) 20 MG tablet Take 2 tablets (40 mg total) by mouth at bedtime.   torsemide (DEMADEX) 20 MG tablet Take 1 tablet (20 mg total) by mouth daily.   TOUJEO SOLOSTAR 300 UNIT/ML Solostar Pen INJECT 35 UNITS SUBCUTANEOUSLY EVERYDAY AT BEDTIME   venlafaxine (EFFEXOR) 75 MG tablet Take 1 tablet (75 mg total) by mouth daily. (Patient taking differently: Take 75 mg by mouth in the morning.)   vitamin B-12 (CYANOCOBALAMIN) 1000 MCG tablet Take 1,000 mcg by mouth in the morning and at bedtime.   No facility-administered encounter medications on file as of 03/20/2021.    Care Gaps:  Star Rating Drugs:    BP Readings from Last 3 Encounters:  03/13/21 (!) 118/46  03/11/21 (!) 141/73  03/05/21 (!) 144/64    Lab Results  Component Value Date   HGBA1C 7.8 (A) 03/11/2021     Patient obtains medications through Adherence Packaging  30 Days   Last adherence delivery included:  Atorvastatin 80 mg one tablet daily - Breakfast  Venlafaxine 75 mg one tablet daily - Breakfast   Toujeo U 300 Inject 35 units into the skin daily  Omeprazole 40 mg one capsule twice Daily - Breakfast, bedtime Torsemide 20 mg one tablet daily - Breakfast Metoprolol 25 mg 1/2 tablet daily -Breakfast  Patient declined the following medications last month:  Ambrisentan 5 mg 1 tablet daily at  bedtime - (patient states she receives from specialist clinic) Tadalafil 20 mg 2 tablets daily at bedtime (patient states she receives from specialist clinic) BD Pen Needle Nano - Daily (Adequate supply) Onetouch ultra test strip (Adequate supply) Onetouch ultra soft lancet (Adequate supply) Losartan 25 mg one tablet daily - Bedtime (Nephrology [facility] hold losartan) Warfarin 3 mg Daily - Vials (Not taking) Trulicity 1.5 mg Inject 1.5 mg into the skin once a week  (patient receiving Trulicity  from [employer]) Eliquis 5 mg 1 tablet 2 times daily (Patient receiving patient assistance)  Patient is due for next adherence delivery on: 03/29/2021 1st Route  Called patient and reviewed medications and coordinated delivery.  This delivery to include: Atorvastatin 80 mg one tablet daily - Breakfast  Venlafaxine 75 mg one tablet daily - Breakfast   Omeprazole 40 mg one capsule twice Daily - Breakfast, bedtime Torsemide 20 mg one tablet daily - Breakfast Metoprolol 25 mg 1/2 tablet daily -Breakfast  Patient declined the following medications Ambrisentan 5 mg 1 tablet daily at bedtime - (patient states she receives from specialist clinic) Tadalafil 20 mg 2 tablets daily at bedtime (patient states she receives from specialist clinic) BD Pen Needle Nano - Daily (Adequate supply) Onetouch ultra test strip (Adequate supply) Onetouch ultra soft lancet (Adequate supply) Losartan 25 mg one tablet daily - Bedtime (Nephrology [facility] hold losartan) Warfarin 3 mg Daily - Vials (Not taking) Trulicity 1.5 mg Inject 1.5 mg into the skin once a week (patient receiving Trulicity  from [employer]) Eliquis 5 mg 1 tablet 2 times daily (Patient receiving patient assistance)   Patient needs refills for Metoprolo ER 25 mg, Venlafaxine 75 mg, Omeprazole 40 mg per form the request was sent through [facility].  Confirmed delivery date of 03/29/2021 1st Route, advised patient that pharmacy will contact them the morning of delivery.  Patient has future appointment with Junius Argyle, CPP on 04/12/2021 @ [place], [place] [employer] Phone: [phone]

## 2021-03-20 NOTE — Progress Notes (Signed)
Advanced Heart Failure Clinic Note  PCP:  Birdie Sons, MD  Cardiologist:  Dr. Rockey Situ HF Cardiologist: Dr. [name] EP: Dr. Curt Bears   Chief Complaint: F/u for chronic diastolic heart failure    History of Present Illness:  Ms. Vary is a 80 year old woman with h/o morbid obesity, DM, OSA on CPAP, CKD 4, PAD, CVA, carotid surgery in 2010, pulmonary embolism (2009) and PAH referred by [facility] for further evaluation of her [place].    She moved from [place], [place] in 2021 to be near her family as things were getting harder. Lives by herself in a townhome.   Reports DVT/PE in 2009 and has been on coumadin since. Was diagnosed with PAH in 2016 at [facility] in [place]. Right and left heart cath at that time reported as nonobstructive CAD and severe PAH with PAP 103/19 (51) with PCWP 5. (see results below) Was told she had idiopathic PAH and started on Letairis and cialis. She weighed 255 pounds at the time.    Never smoked but was exposed to 2nd hand smoke. Previously on portable oxygen at 5L when she was in [place], but the portable O2 was not covered. Now "goes without oxygen", only at night that she wear oxygen with CPAP. Denies h/o CTD.  Evaluated by Dr. [name] for the first time in 11/20: Felt to have primarily WHO Group III PH. Echo and VQ ordered.    Had a syncopal episode in 8/21. CT head showed small 2 mm subdural hematoma. Echocardiogram was obtained, echocardiogram showed EF 55 to 60%, moderate left ventricular hypertrophy, moderately elevated pulmonary artery systolic pressure. Outpatient monitor was ordered and showed atrial flutter continuously (100% burden) with rates ranging from 50 to 133 (average 85 bpm). F/u CT scan 8/23 SDH resolved  OV 11/21 She felt more dizzy since decreasing torsemide, had a fall over Halloween. [name] was started. Had successful DCCV on 07/10/20.  Admitted to [facility] 12/21  after a syncopal episode associated with dizziness prior to  passing out. HR was in the 40-50s when EMS arrived. She admits to running out of her [name] recently. Echo 12/21 with EF 60-65%, RV normal, RHC with moderate to severe PAH with high PA pulse pressure and high cardiac output & moderately elevated left-sided pressures. Cardiac MRI showed EF 51%, mild RV dilation, mod pulmonary regurgitation, markedly dilated main pulmonay artery, and RV insertion site LGE. Patient discharged home with Zio patch. Discharge weight 242 lbs.  Clinic visit 2/22, we started Uptravi after [place] in 12/21 showed worsening PAH. Did not tolerate due to HAs, nausea and hip pain. However hip pain has persisted after stopping and found to have degenerative disc disease. Says she is doing ok. Can do ADLs slowly. No change in breathing. No orthopnea, edema or PND. Using CPAP every night. Now taking Letairis 33m and tadalafil 40 daily    12/06/20 she was admitted for symptomatic AFL w/ RVR. INR was supra therapeutic at 8.6 and reversed w/ Vit K.  She underwent successful DCCV on 4/22. EP evaluated and recommended PO amiodarone and outpatient f/u for discussion regarding possible atrial flutter ablation. Given difficulties regulating therapeutic INRs, she was transitioned off of warfarin to Eliquis. Volume status remained stable during hospitalization, however she had AKI w/ SCr bump to 2.4 (baseline 1.8-2.0). torsemide was changed from daily to PRN. Losartan and Jardiance both discontinued. Tadalafil 40 mg and letairis 5 mg also continued. D/c wt was 236 lb.   At post hospital f/u  visit on 5/9,  Her wt was up from 236>>244 lb (previous clinic wt was 232 lb). She has used torsemide ~2-3 times since discharge. Denied any significant change in respiratory status, remained on 3L/[place] which is her baseline. BP was midly elevated 140/78. She was instructed to change torsemide back to 20 mg daily, however she reports she forgot she was to do this. She has been taking it PRN based on hand and ankle  swelling, which has turned out to be every other day.   On 01/18/21, she underwent AFL ablation by Dr. Curt Bears. Amiodarone stopped at last visit with APP due to bradycardia  Here for FU appointment she feels well.  Shortness of breath she feels is maybe mildly worse since last visit but mostly stable. She did have covid in July and still has some residual cough. Does all her own ADL's, drives and shops.  She has to take her time uses a walker and has her oxygen. Pulse oximeter always in 90's she is always on 3L.  Taking torsemide daily which keeps her fluid under control.      08/06/20 Echo  EF 60-65% RV normal    cMRI 08/07/20: 1. Motion artifact throughout exam affects quantification of volumes 2.  No evidence of shunt with Qp/Qs = 0.94 3.  Normal LV size and systolic function (EF 63%) 4.  Mild RV dilatation with normal systolic function (EF 87%) 5.  Moderate pulmonary regurgitation (regurgitant fraction 23%) 6. RV insertion site late gadolinium enhancement, which is a nonspecific finding often seen in setting of pulmonary hypertension 7.  Markedly dilated main pulmonary artery, measuring 14m x 430m8. Mild mitral regurgitation (regurgitant fraction 18%), mild aortic regurgitation (regurgitant fraction 15%), and mild tricuspid regurgitation (regurgitant fraction 12%)  VQ 07/07/19: No PE  RHC  07/29/19 RA = 8 RV = 81/14 PA =  80/18 (42) PCW =17 Fick cardiac output/index = 9.25/4.25 PVR = 2.70 WU FA sat = 98% PA sat = 75%, 75% High SVC 72%  RHC 9/21 RA = 8 RV = 65/7 PA = 61/19 (34) PCW = 19 (v= 27) Fick cardiac output/index = 8.1/3.8 PVR = 1.2 WU Ao sat = 99% PA sat = 75%, 78% SVC = 70%  RHC 12/21 RA = 11 RV = 91/14 PA = 93/20 (47) PCW = 21 Fick cardiac output/index = 8.0/3.7 PVR = 3.1 WU FA sat = 99% PA sat = 72%, 73% High SVC = 72% PAPi = 6.6   PFTs 09/28/19  FEV1 1.92 (57%) FVC 2.57 (58%) DLCO 61%  Previous cardiac studies:  12/16/2014 right and left  heart catheterization with nitric oxide inhalation, Pressure data resting: - Mean right atrial pressure 2 Right ventricule 10564/3nd diastolic - Pulmonary artery 103/19, mean 51. Pulmonary artery wedge mean 5 - Ascending thoracic aorta 111/53, mean 75. -Fick cardiac output 4.2 L. -Pulmonary resistance 12.25 Woods units. With nitric oxide inhalation - Pulmonary artery 89/29, mean 42. -Right ventricle 9132/9nd diastolic. -Right atrial 2. -Fick cardiac output 4.94 L. - Pulmonary resistance 15.91 Woods units.  Carotid [place]/22:    Past Medical History:  Diagnosis Date   A-fib ([place]   Anemia    CHF (congestive heart failure) (HCC)    Chronic kidney disease    Diabetes mellitus without complication ([place]   Hypertension    Other cervical disc degeneration at C4-C5 level    severe _0  5   moderate 2 3 level   Pulmonary hypertension ([place]  Subdural hematoma, acute ([place]) 03/24/2020   Syncope 07/2020   Past Surgical History:  Procedure Laterality Date   A-FLUTTER ABLATION N/A 01/18/2021   Procedure: A-FLUTTER ABLATION;  Surgeon: Constance Haw, MD;  Location: [place] [facility];  Service: Cardiovascular;  Laterality: N/A;   ABDOMINAL HYSTERECTOMY  1997   ABDOMINAL HYSTERECTOMY     CARDIOVERSION N/A 07/10/2020   Procedure: CARDIOVERSION;  Surgeon: Jolaine Artist, MD;  Location: [place];  Service: Cardiovascular;  Laterality: N/A;   CARDIOVERSION N/A 12/07/2020   Procedure: CARDIOVERSION;  Surgeon: Jolaine Artist, MD;  Location: [facility] ENDOSCOPY;  Service: Cardiovascular;  Laterality: N/A;   cartiod artery surgery  2010   per patient    CHOLECYSTECTOMY  10/19/2009   masectomy     [place]   RIGHT HEART CATH N/A 07/29/2019   Procedure: RIGHT HEART CATH;  Surgeon: Jolaine Artist, MD;  Location: [place] [facility];  Service: Cardiovascular;  Laterality: N/A;   RIGHT HEART CATH N/A 05/11/2020   Procedure: RIGHT HEART CATH;  Surgeon: Jolaine Artist,  MD;  Location: [place] [facility];  Service: Cardiovascular;  Laterality: N/A;   RIGHT HEART CATH N/A 08/07/2020   Procedure: RIGHT HEART CATH;  Surgeon: Jolaine Artist, MD;  Location: [place] [facility];  Service: Cardiovascular;  Laterality: N/A;    Current Outpatient Medications  Medication Sig Dispense Refill   acetaminophen (TYLENOL) 325 MG tablet Take 650 mg by mouth in the morning and at bedtime.     ambrisentan (LETAIRIS) 5 MG tablet Take by mouth daily.     apixaban (ELIQUIS) 5 MG TABS tablet Take 1 tablet (5 mg total) by mouth 2 (two) times daily. 180 tablet 1   atorvastatin (LIPITOR) 80 MG tablet Take 1 tablet (80 mg total) by mouth daily. 90 tablet 2   Cholecalciferol (VITAMIN D3) 50 MCG (2000 UT) TABS Take 2,000 Units by mouth in the morning and at bedtime.     Dulaglutide (TRULICITY) 1.5 XL/2.4MW SOPN Inject 1.5 mg into the skin once a week. Patient receives through [employer] Patient Assistance through December 2022     ferrous sulfate (FEROSUL) 325 (65 FE) MG tablet Take 325 mg by mouth daily with breakfast.     glucose blood (ONETOUCH ULTRA) test strip Use to check blood sugar once daily as instructed 100 each 2   HYDROcodone-acetaminophen (NORCO) 7.5-325 MG tablet Take 1 tablet by mouth every 6 (six) hours as needed for moderate pain. 30 tablet 0   Insulin Pen Needle (BD PEN NEEDLE NANO U/F) 32G X 4 MM MISC Use to inject insulin once daily as directed 100 each 2   Lancets (ONETOUCH ULTRASOFT) lancets Use to check blood sugars once daily as instructed 100 each 12   metoprolol succinate (TOPROL-XL) 25 MG 24 hr tablet Take 0.5 tablets (12.5 mg total) by mouth in the morning. 15 tablet 0   omeprazole (PRILOSEC) 40 MG capsule Take 1 capsule (40 mg total) by mouth in the morning and at bedtime. 90 capsule 1   PRESCRIPTION MEDICATION CPAP: At bedtime     tadalafil, PAH, (ADCIRCA) 20 MG tablet Take 2 tablets (40 mg total) by mouth at bedtime. 180 tablet 0   torsemide (DEMADEX)  20 MG tablet Take 1 tablet (20 mg total) by mouth daily. 30 tablet 11   TOUJEO SOLOSTAR 300 UNIT/ML Solostar Pen INJECT 35 UNITS SUBCUTANEOUSLY EVERYDAY AT BEDTIME 4.5 mL 1   venlafaxine (EFFEXOR) 75 MG tablet Take 1  tablet (75 mg total) by mouth daily. 90 tablet 0   vitamin B-12 (CYANOCOBALAMIN) 1000 MCG tablet Take 1,000 mcg by mouth in the morning and at bedtime.     No current facility-administered medications for this encounter.    Allergies:   Penicillins, Sulfa antibiotics, and Uptravi [selexipag]   Social History:  The patient  reports that she has never smoked. She has never used smokeless tobacco. She reports that she does not drink alcohol and does not use drugs.   Family History:  The patient's family history includes CAD in her maternal grandmother and mother; Heart disease in her father.   ROS:  Please see the history of present illness.   All other systems are personally reviewed and negative.   Vitals:   03/20/21 1139  BP: 130/70  Pulse: 63  SpO2: 94%  Weight: 107.9 kg (237 lb 12.8 oz)    Wt Readings from Last 3 Encounters:  03/20/21 107.9 kg (237 lb 12.8 oz)  03/13/21 107.3 kg (236 lb 9.6 oz)  03/11/21 108.1 kg (238 lb 6.4 oz)   EKG: sinus bradycardia 59 bpm  PHYSICAL EXAM: General:  Well appearing. No respiratory difficulty HEENT: normal Neck: supple. JVD 8 cm Carotids 2+ bilat; no bruits. No lymphadenopathy or thyromegaly appreciated. Cor: PMI nondisplaced. Regular rhythm, slow rate. No rubs, gallops or murmurs. Lungs: clear Abdomen: soft, nontender, nondistended. No hepatosplenomegaly. No bruits or masses. Good bowel sounds. Extremities: no cyanosis, clubbing, rash, edema Neuro: alert & oriented x 3, cranial nerves grossly intact. moves all 4 extremities w/o difficulty. Affect pleasant.    Recent Labs: 12/06/2020: TSH 1.283 12/08/2020: Magnesium 2.1 12/24/2020: ALT 12 01/21/2021: B Natriuretic Peptide 253.3 01/28/2021: BUN 31; Creatinine, Ser 1.80;  Potassium 4.4; Sodium 140 03/11/2021: Hemoglobin 9.4; Platelets 238  Personally reviewed    ASSESSMENT AND PLAN:  1. Paroxysmal AFL - s/p DC-CV 07/10/20 - Recent Admit 4/22 w/ recurrent ALF w/ RVR s/p DCCV - S/p AFL Ablation 01/18/21 - Continue Toprol XL 12.5 mg daily. - Continuing on eliquis primarily for hx of PE/DVT   2. Pulmonary HTN  - Suspect combination of WHO Group 1, 2 & 3  - VQ 07/07/19: Not indicative of CTEPH  - RV function remains normal on echo  - PFTs with significant restrictive lung physiology - cMRI showed no evidence of shunt, Qp/Qs=0.94. - Continue O2 supplementation and CPAP. - Continue tadalafil 40 mg and letairis 5 mg (unable to tolerate higher dose of letairis). - [place] 12/21 with moderate PAH as outlined above - Unable to tolerate selexipag currently. Can consider rechallenge in the future.   - Echo 12/05/20 LVEF 60% RV normal - Has completed pulmonary rehab - Symptoms are relatively stable we discussed repeating RHC and potentially expanding to triple therapy but she would like to defer for now given she's feeling okay which is reasonable.  We will reassess in 4 months.    3. Chronic Diastolic HF - Stable NYHA III symptoms, volume status okay - AKI 11/2020 losartan and jardiance held cr incrementally improving since then  - recheck bmp today, if renal fx stable could consider another trial of jardiance at next visit - Continue torsemide 20 daily  4. CKD 4 - Baseline Creatinine 1.9 - recent AKI during hospitalization 4/22 w/ SCr bumping to 2.4  - now off losartan and jardiance  - BMP today  - she is followed by [facility]  5. Nonobstructive CAD - No s/s angina - Continue statin.   - Off ASA with  Eliquis    6. H/o PE 2009 - VQ 11/20 negative. - c/w Eliquis    7. H/o Syncope - Given severity of PAH, suspicion has been that this was related to [facility] but cardiac outputs and PA pulsativity argue against this somewhat unless preload dropped for some reason. She  was bradycardic when event occurred, may also be contributing factor.   - No further syncopal episodes or dizziness since  - Zio 12/21. SR with frequent runs of SVT (1547 runs - longest 58 seconds)/ No high-grade arrhythmias or pauses - S/p AFL Ablation 6/22  8. HTN - now well controlled   Signed, Katherine Roan, MD  03/20/2021 12:05 PM  Patient seen and examined with the above-signed Advanced Practice Provider and/or Housestaff. I personally reviewed laboratory data, imaging studies and relevant notes. I independently examined the patient and formulated the important aspects of the plan. I have edited the note to reflect any of my changes or salient points. I have personally discussed the plan with the patient and/or family.  Remains on combination therapy for PAH. Did not tolerate Uptravi previously. Recently had Covid but said it wasn't too bad. Says she is feeling really well currently. Stable NYHA III dyspnea. No edema. Remains in NSR after AFL ablation.   General:  obese woman wearing O2 with  No resp difficulty HEENT: normal Neck: supple. JVP 6 Carotids 2+ bilat; no bruits. No lymphadenopathy or thryomegaly appreciated. Cor: PMI nondisplaced. Regular rate & rhythm. 2/6 TR Lungs: clear with decreased BS Abdomen: obese soft, nontender, nondistended. No hepatosplenomegaly. No bruits or masses. Good bowel sounds. Extremities: no cyanosis, clubbing, rash, edema Neuro: alert & orientedx3, cranial nerves grossly intact. moves all 4 extremities w/o difficulty. Affect pleasant  Stable NYHA III dyspnea on combination PAH therapy. Volume status looks good. Previously failed trial of Uptravi. We discussed possible repeat RHC and possibly using Tyvaso. She says she would like to defer for now because she is feeling so well. Will revisit with her at next visit. Remains in NSR after AFL ablation.   Glori Bickers, MD  10:58 PM

## 2021-03-23 DIAGNOSIS — I272 Pulmonary hypertension, unspecified: Secondary | ICD-10-CM | POA: Diagnosis not present

## 2021-03-23 DIAGNOSIS — I509 Heart failure, unspecified: Secondary | ICD-10-CM | POA: Diagnosis not present

## 2021-03-23 DIAGNOSIS — J9611 Chronic respiratory failure with hypoxia: Secondary | ICD-10-CM | POA: Diagnosis not present

## 2021-03-29 ENCOUNTER — Other Ambulatory Visit: Payer: Self-pay | Admitting: Family Medicine

## 2021-03-29 DIAGNOSIS — F32A Depression, unspecified: Secondary | ICD-10-CM

## 2021-03-29 DIAGNOSIS — K219 Gastro-esophageal reflux disease without esophagitis: Secondary | ICD-10-CM

## 2021-04-11 ENCOUNTER — Telehealth: Payer: Self-pay

## 2021-04-11 NOTE — Progress Notes (Signed)
Chronic Care Management Pharmacy Assistant   Name: LONIA ROANE  MRN: [id number] DOB: 1941-07-06  Patient called to be reminded of her appointment with Junius Argyle, CPP on 08/26/20022 _0  via telephone.  Patient aware of appointment date, time, and type of appointment (either telephone or in person). Patient aware to have/bring all medications, supplements, blood pressure and/or blood sugar logs to visit.  Questions: Are there any concerns you would like to discuss during your office visit? None  Are you having any problems obtaining your medications? No  If patient has any PAP medications ask if they are having any problems getting their PAP medication or refill? No problems  Star Rating Drug: Trulicity 1.5 mg patient on PAP for this medication  Atorvastatin 80 mg last filled on 03/29/2021 for a 30-Day supply with [facility]  Any gaps in medications fill history? No   Care Gaps: Urine Microalbumin Hepatitis C Screening Tetanus/TDAP Zoster Vaccines COVID-19 Vaccine Booster 4 Ophthalmology Exam (last completed 09/28/2019) Influenza Vaccine (last completed 07/24/2020)  Lynann Bologna, CPA/CMA Clinical Pharmacist Assistant Phone: [phone]

## 2021-04-12 ENCOUNTER — Ambulatory Visit (INDEPENDENT_AMBULATORY_CARE_PROVIDER_SITE_OTHER): Payer: Medicare Other

## 2021-04-12 DIAGNOSIS — E785 Hyperlipidemia, unspecified: Secondary | ICD-10-CM

## 2021-04-12 DIAGNOSIS — E1122 Type 2 diabetes mellitus with diabetic chronic kidney disease: Secondary | ICD-10-CM

## 2021-04-12 DIAGNOSIS — N184 Chronic kidney disease, stage 4 (severe): Secondary | ICD-10-CM

## 2021-04-12 DIAGNOSIS — E1169 Type 2 diabetes mellitus with other specified complication: Secondary | ICD-10-CM | POA: Diagnosis not present

## 2021-04-12 DIAGNOSIS — Z794 Long term (current) use of insulin: Secondary | ICD-10-CM | POA: Diagnosis not present

## 2021-04-12 NOTE — Progress Notes (Signed)
Chronic Care Management Pharmacy Note  04/15/2021 Name:  Ann Flynn MRN:  [id number] DOB:  January 25, 1941  Summary: Patient presents for CCM follow-up. She is excited that her brother an son will be visiting for her 80th birthday. She received her shipment of Trulicity 3 mg pens, but has not started them yet. She has had a few instances of near hypoglycemia and mild associated symptoms.   Recommendations/Changes made from today's visit: INCREASE Trulicity 3 mg weekly  DECREASE Toujeo to 30 units nightly   Plan: CPP follow-up 2 months   Subjective: Ann Flynn is an 80 y.o. year old female who is a primary patient of Fisher, Kirstie Peri, MD.  The CCM team was consulted for assistance with disease management and care coordination needs.    Engaged with patient by telephone for follow up visit in response to provider referral for pharmacy case management and/or care coordination services.   Consent to Services:  The patient was given information about Chronic Care Management services, agreed to services, and gave verbal consent prior to initiation of services.  Please see initial visit note for detailed documentation.   Patient Care Team: Birdie Sons, MD as PCP - General (Family Medicine) Bensimhon, Shaune Pascal, MD as PCP - Advanced Heart Failure (Cardiology) Bensimhon, Shaune Pascal, MD as PCP - Cardiology (Cardiology) Germaine Pomfret, [facility] as Pharmacist (Pharmacist) Murlean Iba, MD (Nephrology)  Recent office visits: 12/14/2020 Dr.Fisher MD (PCP)- Omeprazole 40 mg change from daily to 2 times daily 11/27/2020 Dr Caryn Section MD (PCP) - Started prednisone 20 mg one tablet twice a day for 7 days, Change Hydrocodone-Acetaminophen from 5-325 mg to 7.5-325 mg PRN.  Recent consult visits: 03/20/21: Patient presented to Dr. [name] (cardiology)  01/21/21: Patient presented to Lyda Jester, PA-C (Heart Failure) for follow-up. Stop Amiodarone, Continue to hold losartan, Jardiance.   12/27/20: Patient presented to Dr. Curt Bears (cardiology) for follow-up. A-flutter ablation on 6/3.  12/26/20: Patient presented to Dr. Candiss Norse (Nephrology) for follow-up. No medication changes made.  12/05/2020 Dr [name] MD (Cardiology)  11/26/2020 Audry Riles RPH-CPP (Heart Failure Clinic) decreased Uptravi to 400 mcg BID.  Hospital visits: Medication Reconciliation was completed by comparing discharge summary, patient's EMR and Pharmacy list, and upon discussion with patient.   Admitted to the hospital on 12/06/2020 due to Atrial flutter with rapid ventricular response. Discharge date was 12/09/2020. Discharged from [place]?Medications Started at [facility] Discharge:?? -started Amiodarone HCI 200 mg daily  -Eliquis 5 mg BID   Medication Changes at Hospital Discharge: -Changed Coumadin to Eliquis 5 mg Metoprolol change to 0.5 tablet once daily Change torsemide 20 mg to prn Change Lantus dose from 45 units to 30 units nightly Medications Discontinued at Hospital Discharge: -Losartan on hold  Jardiance on hold due to AKI   Medications that remain the same after Hospital Discharge:??  -All other medications will remain the same.     Admitted to the hospital on 11/23/2020 due to Sciatic nerve pain. Discharge date was 11/23/2020. Discharged from [facility]?Medications Started at [facility] Discharge:?? -started Hydrocodone-acetaminophen 5-325 mg PRN   Medication Changes at Hospital Discharge: -Changed None   Medications Discontinued at Hospital Discharge: -Stopped None    Medications that remain the same after Hospital Discharge:??  -All other medications will remain the same.  Objective:  Lab Results  Component Value Date   CREATININE 1.86 (H) 03/20/2021   BUN 34 (H) 03/20/2021  GFRNONAA 27 (L) 03/20/2021   GFRAA 29 (L) 07/24/2020   NA 141 03/20/2021   K 4.8 03/20/2021   CALCIUM 9.3 03/20/2021   CO2 28  03/20/2021   GLUCOSE 123 (H) 03/20/2021    Lab Results  Component Value Date/Time   HGBA1C 7.8 (A) 03/11/2021 02:55 PM   HGBA1C 7.6 (A) 10/23/2020 10:56 AM   HGBA1C 7.9 (H) 03/24/2020 06:15 AM   HGBA1C 7.2 (H) 03/28/2019 04:11 PM    Last diabetic Eye exam:  Lab Results  Component Value Date/Time   HMDIABEYEEXA Retinopathy (A) 09/28/2019 12:00 AM    Last diabetic Foot exam: No results found for: HMDIABFOOTEX   Lab Results  Component Value Date   CHOL 143 03/11/2021   HDL 41 03/11/2021   LDLCALC 74 03/11/2021   TRIG 165 (H) 03/11/2021   CHOLHDL 3.5 03/11/2021    Hepatic Function Latest Ref Rng & Units 12/24/2020 12/06/2020 07/24/2020  Total Protein 6.5 - 8.1 g/dL 5.4(L) 5.7(L) 5.8(L)  Albumin 3.5 - 5.0 g/dL 3.1(L) 3.3(L) 3.8  AST 15 - 41 U/L 10(L) 26 8  ALT 0 - 44 U/L 12 41 8  Alk Phosphatase 38 - 126 U/L 70 54 79  Total Bilirubin 0.3 - 1.2 mg/dL 0.5 0.6 0.2    Lab Results  Component Value Date/Time   TSH 1.283 12/06/2020 05:09 PM    CBC Latest Ref Rng & Units 03/20/2021 03/11/2021 01/18/2021  WBC 4.0 - 10.5 K/uL 5.7 6.2 4.9  Hemoglobin 12.0 - 15.0 g/dL 9.2(L) 9.4(L) 10.0(L)  Hematocrit 36.0 - 46.0 % 30.8(L) 29.3(L) 33.2(L)  Platelets 150 - 400 K/uL 247 238 267    No results found for: VD25OH  Clinical ASCVD: Yes  The ASCVD Risk score Mikey Bussing DC Jr., et al., 2013) failed to calculate for the following reasons:   The patient has a prior MI or stroke diagnosis    Depression screen [facility] 2/9 07/24/2020 02/14/2020 10/11/2019  Decreased Interest _0 Down, Depressed, Hopeless 1 0 1  PHQ - 2 Score _1 Altered sleeping 1 0 1  Tired, decreased energy _2 Change in appetite _3 Feeling bad or failure about yourself  1 0 1  Trouble concentrating 0 0 0  Moving slowly or fidgety/restless 0 0 0  Suicidal thoughts 0 0 0  PHQ-9 Score _4 Difficult doing work/chores Somewhat difficult Not difficult at all Somewhat difficult  Some recent data might be hidden    Social  History   Tobacco Use  Smoking Status Never  Smokeless Tobacco Never   BP Readings from Last 3 Encounters:  03/20/21 130/70  03/13/21 (!) 118/46  03/11/21 (!) 141/73   Pulse Readings from Last 3 Encounters:  03/20/21 63  03/13/21 60  03/11/21 60   Wt Readings from Last 3 Encounters:  03/20/21 237 lb 12.8 oz (107.9 kg)  03/13/21 236 lb 9.6 oz (107.3 kg)  03/11/21 238 lb 6.4 oz (108.1 kg)   BMI Readings from Last 3 Encounters:  03/20/21 39.57 kg/m  03/13/21 39.37 kg/m  03/11/21 39.67 kg/m    Assessment/Interventions: Review of patient past medical history, allergies, medications, health status, including review of consultants reports, laboratory and other test data, was performed as part of comprehensive evaluation and provision of chronic care management services.   SDOH:  (Social Determinants of Health) assessments and interventions performed: Yes SDOH Interventions    Flowsheet Row Most Recent Value  SDOH Interventions  Financial Strain Interventions Intervention Not Indicated       SDOH Screenings   Alcohol Screen: Low Risk    Last Alcohol Screening Score (AUDIT): 0  Depression (PHQ2-9): Medium Risk   PHQ-2 Score: 7  Financial Resource Strain: Low Risk    Difficulty of Paying Living Expenses: Not hard at all  Food Insecurity: Not on file  Housing: Not on file  Physical Activity: Not on file  Social Connections: Not on file  Stress: Not on file  Tobacco Use: Low Risk    Smoking Tobacco Use: Never   Smokeless Tobacco Use: Never  Transportation Needs: No Transportation Needs   Lack of Transportation (Medical): No   Lack of Transportation (Non-Medical): No    CCM Care Plan  Allergies  Allergen Reactions   Penicillins Swelling    Facial swelling Did it involve swelling of the face/tongue/throat, SOB, or low BP? Yes Did it involve sudden or severe rash/hives, skin peeling, or any reaction on the inside of your mouth or nose? No Did you need to seek  medical attention at a hospital or doctor's office? No When did it last happen?      10 + years If all above answers are "NO", may proceed with cephalosporin use.    Sulfa Antibiotics Rash   Malvin Johns [Selexipag] Other (See Comments)    headaches    Medications Reviewed Today     Reviewed by Germaine Pomfret, [facility] (Pharmacist) on 04/12/21 at 1204  Med List Status: <None>   Medication Order Taking? Sig Documenting Provider Last Dose Status Informant  acetaminophen (TYLENOL) 325 MG tablet [id number]  Take 650 mg by mouth in the morning and at bedtime. [provider]  Active Self  ambrisentan (LETAIRIS) 5 MG tablet [id number] Yes Take by mouth daily. [provider] Taking Active   apixaban (ELIQUIS) 5 MG TABS tablet [id number] Yes Take 1 tablet (5 mg total) by mouth 2 (two) times daily. Constance Haw, MD Taking Active   atorvastatin (LIPITOR) 80 MG tablet [id number] Yes Take 1 tablet (80 mg total) by mouth daily. Trinna Post, PA-C Taking Active Self  b complex vitamins capsule [id number] Yes Take 1 capsule by mouth daily. [provider] Taking Active   Biotin 1000 MCG tablet [id number] Yes Take 1,000 mcg by mouth in the morning and at bedtime. [provider] Taking Active   Cholecalciferol (VITAMIN D3) 50 MCG (2000 UT) TABS [id number]  Take 2,000 Units by mouth in the morning and at bedtime. [provider]  Active Self  Dulaglutide (TRULICITY) 1.5 UL/8.4TX SOPN [id number]  Inject 1.5 mg into the skin once a week. Patient receives through [employer] Patient Assistance through December 2022 Birdie Sons, MD  Active   ferrous sulfate 325 (65 FE) MG tablet [id number] Yes Take 325 mg by mouth daily with breakfast. [provider] Taking Active   glucose blood (ONETOUCH ULTRA) test strip [id number]  Use to check blood sugar once daily as instructed Birdie Sons, MD  Active   HYDROcodone-acetaminophen [facility]) 7.5-325 MG tablet  [id number]  Take 1 tablet by mouth every 6 (six) hours as needed for moderate pain. Birdie Sons, MD  Active Self           Med Note Lanna Poche R   Tue Mar 05, 2021 12:25 PM)    Insulin Pen Needle (BD PEN NEEDLE NANO U/F) 32G X 4 MM MISC [id number]  Use to inject insulin once daily as directed Lelon Huh  E, MD  Active Self  Lancets (ONETOUCH ULTRASOFT) lancets [id number]  Use to check blood sugars once daily as instructed Fisher, Kirstie Peri, MD  Active   metoprolol succinate (TOPROL-XL) 25 MG 24 hr tablet [id number]  Take 0.5 tablets (12.5 mg total) by mouth in the morning. Birdie Sons, MD  Active   omeprazole (PRILOSEC) 40 MG capsule [id number]  TAKE ONE CAPSULE BY MOUTH EVERY MORNING and TAKE ONE CAPSULE BY MOUTH EVERYDAY AT BEDTIME Birdie Sons, MD  Active   PRESCRIPTION MEDICATION [id number]  CPAP: At bedtime [provider]  Active Self  tadalafil, PAH, (ADCIRCA) 20 MG tablet [id number]  Take 2 tablets (40 mg total) by mouth at bedtime. Flora Lipps, MD  Active   torsemide (DEMADEX) 20 MG tablet [id number]  Take 1 tablet (20 mg total) by mouth daily. Lyda Jester [place], PA-C  Active   TOUJEO SOLOSTAR 300 UNIT/ML Solostar Pen [id number]  INJECT 49 UNITS SUBCUTANEOUSLY EVERYDAY AT BEDTIME Birdie Sons, MD  Active   venlafaxine [facility]) 75 MG tablet [id number]  TAKE ONE TABLET BY MOUTH EVERY MORNING Birdie Sons, MD  Active             Patient Active Problem List   Diagnosis Date Noted   DDD (degenerative disc disease), lumbar 12/14/2020   A-fib ([place]) 12/06/2020   Atrial flutter with rapid ventricular response (HCC)    DDD (degenerative disc disease), cervical 11/27/2020   Syncope and collapse 08/05/2020   Atrial fibrillation, chronic ([place]) 03/24/2020   Type 2 diabetes mellitus with stage 4 chronic kidney disease ([place]) 03/24/2020   Syncope 03/24/2020   Pulmonary hypertension ([place]) 03/24/2020   Pulmonary nodules 01/30/2020   Chronic pulmonary embolism,  unspecified pulmonary embolism type, unspecified whether acute cor pulmonale present ([place]) 10/06/2019   Chronic respiratory failure with hypoxia ([place]) 10/06/2019   Morbid (severe) obesity due to excess calories ([place]) 10/06/2019   Hypertension associated with diabetes ([place]) 10/06/2019   Hyperlipidemia associated with type 2 diabetes mellitus ([place]) 10/06/2019   CKD (chronic kidney disease), stage IV (HCC) 04/01/2019   Allergic rhinitis 03/28/2019   Arthritis 03/28/2019   CHF (congestive heart failure) ([place]) 03/28/2019   Sleep apnea 03/28/2019   Pulmonary hypertension, primary ([place]) 03/28/2019    Immunization History  Administered Date(s) Administered   Fluad Quad(high Dose 65+) 07/06/2019, 07/24/2020   Influenza-Unspecified 06/28/2018   PFIZER(Purple Top)SARS-COV-2 Vaccination 10/17/2019, 11/07/2019, 05/17/2020    Conditions to be addressed/monitored:  Hypertension, Hyperlipidemia, Diabetes, Atrial Fibrillation, Heart Failure and Chronic Kidney Disease  Care Plan : General Pharmacy (Adult)  Updates made by Germaine Pomfret, RPH since 04/15/2021 12:00 AM     Problem: Hypertension, Hyperlipidemia, Diabetes, Atrial Fibrillation, Heart Failure and Chronic Kidney Disease   Priority: High     Long-Range Goal: Patient-Specific Goal   Start Date: 01/02/2021  Expected End Date: 07/05/2021  This Visit's Progress: On track  Recent Progress: On track  Priority: High  Note:   Current Barriers:  Unable to independently afford treatment regimen Unable to maintain control of Diabetes  Pharmacist Clinical Goal(s):  Patient will achieve control of diabetes as evidenced by A1c less than 8% through collaboration with PharmD and provider.   Interventions: 1:1 collaboration with Birdie Sons, MD regarding development and update of comprehensive plan of care as evidenced by provider attestation and co-signature Inter-disciplinary care team collaboration (see longitudinal plan of  care) Comprehensive medication review performed; medication list updated in electronic medical record  Heart Failure (Goal: manage symptoms  and prevent exacerbations) -Controlled -Last ejection fraction: 55-60% (Date: 12/05/20) -HF type: Diastolic -NYHA Class: III (marked limitation of activity) -AHA HF Stage: C (Heart disease and symptoms present) -Current treatment: Ambrisentan 5 mg nightly Metoprolol XL 25 mg 1/2 tablet daily Tadalafil 20 mg 2 tablets nightly  Torsemide 20 mg daily -Medications previously tried: Losartan (AKI) -Educated on Proper diuretic administration and potassium supplementation Importance of blood pressure control -Recommended to continue current medication  Hyperlipidemia: (LDL goal < 70) -Controlled -Current treatment: Atorvastatin 80 mg daily  -Medications previously tried: NA  -Educated on Importance of limiting foods high in cholesterol; -Recommended to continue current medication  Diabetes (A1c goal <8%) -Controlled -Current medications: Trulicity 1.5 mg weekly  Toujeo 35 units nightly -Medications previously tried: Jardiance (AKI)    -Current home glucose readings fasting glucose: 149, 99, 150-175  post prandial glucose: NA  -Denies hypoglycemic/hyperglycemic symptoms -INCREASE Trulicity 3 mg weekly  -DECREASE Toujeo to 30 units nightly   Atrial Fibrillation (Goal: prevent stroke and major bleeding) -Controlled -CHADSVASC: 6 -Current treatment: Rate control: Metoprolol XL 25 mg 1/2 tablet daily  Anticoagulation: Eliquis 5 mg twice daily  -Medications previously tried: NA -Counseled on avoidance of NSAIDs due to increased bleeding risk with anticoagulants; -Recommended to continue current medication  Chronic Kidney Disease Stage 4  -All medications assessed for renal dosing and appropriateness in chronic kidney disease. -Recommended to continue current medication  Patient Goals/Self-Care Activities Patient will:  - check glucose  twice daily, document, and provide at future appointments check blood pressure weekly, document, and provide at future appointments weigh daily, and contact provider if weight gain of greater than 2 pounds in 24 hours  Follow Up Plan: Telephone follow up appointment with care management team member scheduled for:  06/07/2021 at 11:00 AM       Medication Assistance: Application for Eliquis  medication assistance program. in process.  Anticipated assistance start date TBD.  See plan of care for additional detail.  Trulicity obtained through [facility] Patient Assistance. Coverage ends Dec 2022.   Patient's preferred pharmacy is:  [facility] - [place], [place] - [address] [place] [address] Phone: [phone] Fax: [phone]  [facility]  ([place]) - [place], [facility] [address] [place] [address] [place] [address] Phone: [phone] Fax: [phone]  [facility] ([facility]) - [place], [place] [address] Phone: [phone] Fax: [phone]  Patient decided to: Utilize [facility] for medication synchronization, packaging and delivery  Care Plan and Follow Up Patient Decision:  Patient agrees to Care Plan and Follow-up.  Plan: Telephone follow up appointment with care management team member scheduled for:  06/07/2021 at 11:00 AM  Junius Argyle, PharmD, [place] [phone]

## 2021-04-14 ENCOUNTER — Other Ambulatory Visit: Payer: Self-pay | Admitting: Internal Medicine

## 2021-04-15 MED ORDER — TRULICITY 3 MG/0.5ML ~~LOC~~ SOAJ: 3.0000 mg | SUBCUTANEOUS | Status: AC

## 2021-04-15 MED ORDER — TOUJEO SOLOSTAR 300 UNIT/ML ~~LOC~~ SOPN: 30.0000 [IU] | PEN_INJECTOR | Freq: Every day | SUBCUTANEOUS | Status: DC

## 2021-04-15 NOTE — Patient Instructions (Signed)
Visit Information It was great speaking with you today!  Please let me know if you have any questions about our visit.   Goals Addressed             This Visit's Progress    Monitor and Manage My Blood Sugar-Diabetes Type 2   On track    Timeframe:  Long-Range Goal Priority:  High Start Date:  01/02/2021                            Expected End Date: 07/05/2021                      Follow Up Date 06/07/2021   - check blood sugar at prescribed times - check blood sugar if I feel it is too high or too low - enter blood sugar readings and medication or insulin into daily log - take the blood sugar meter to all doctor visits    Why is this important?   Checking your blood sugar at home helps to keep it from getting very high or very low.  Writing the results in a diary or log helps the doctor know how to care for you.  Your blood sugar log should have the time, date and the results.  Also, write down the amount of insulin or other medicine that you take.  Other information, like what you ate, exercise done and how you were feeling, will also be helpful.     Notes:         Patient Care Plan: General Pharmacy (Adult)     Problem Identified: Hypertension, Hyperlipidemia, Diabetes, Atrial Fibrillation, Heart Failure and Chronic Kidney Disease   Priority: High     Long-Range Goal: Patient-Specific Goal   Start Date: 01/02/2021  Expected End Date: 07/05/2021  This Visit's Progress: On track  Recent Progress: On track  Priority: High  Note:   Current Barriers:  Unable to independently afford treatment regimen Unable to maintain control of Diabetes  Pharmacist Clinical Goal(s):  Patient will achieve control of diabetes as evidenced by A1c less than 8% through collaboration with PharmD and provider.   Interventions: 1:1 collaboration with Birdie Sons, MD regarding development and update of comprehensive plan of care as evidenced by provider attestation and  co-signature Inter-disciplinary care team collaboration (see longitudinal plan of care) Comprehensive medication review performed; medication list updated in electronic medical record  Heart Failure (Goal: manage symptoms and prevent exacerbations) -Controlled -Last ejection fraction: 55-60% (Date: 12/05/20) -HF type: Diastolic -NYHA Class: III (marked limitation of activity) -AHA HF Stage: C (Heart disease and symptoms present) -Current treatment: Ambrisentan 5 mg nightly Metoprolol XL 25 mg 1/2 tablet daily Tadalafil 20 mg 2 tablets nightly  Torsemide 20 mg daily -Medications previously tried: Losartan (AKI) -Educated on Proper diuretic administration and potassium supplementation Importance of blood pressure control -Recommended to continue current medication  Hyperlipidemia: (LDL goal < 70) -Controlled -Current treatment: Atorvastatin 80 mg daily  -Medications previously tried: NA  -Educated on Importance of limiting foods high in cholesterol; -Recommended to continue current medication  Diabetes (A1c goal <8%) -Controlled -Current medications: Trulicity 1.5 mg weekly  Toujeo 35 units nightly -Medications previously tried: Jardiance (AKI)    -Current home glucose readings fasting glucose: 149, 99, 150-175  post prandial glucose: NA  -Denies hypoglycemic/hyperglycemic symptoms -INCREASE Trulicity 3 mg weekly  -DECREASE Toujeo to 30 units nightly   Atrial Fibrillation (Goal: prevent stroke and  major bleeding) -Controlled -CHADSVASC: 6 -Current treatment: Rate control: Metoprolol XL 25 mg 1/2 tablet daily  Anticoagulation: Eliquis 5 mg twice daily  -Medications previously tried: NA -Counseled on avoidance of NSAIDs due to increased bleeding risk with anticoagulants; -Recommended to continue current medication  Chronic Kidney Disease Stage 4  -All medications assessed for renal dosing and appropriateness in chronic kidney disease. -Recommended to continue current  medication  Patient Goals/Self-Care Activities Patient will:  - check glucose twice daily, document, and provide at future appointments check blood pressure weekly, document, and provide at future appointments weigh daily, and contact provider if weight gain of greater than 2 pounds in 24 hours  Follow Up Plan: Telephone follow up appointment with care management team member scheduled for:  06/07/2021 at 11:00 AM      Patient agreed to services and verbal consent obtained.   Patient verbalizes understanding of instructions provided today and agrees to view in [place].   Junius Argyle, PharmD, [place], [place] [phone]

## 2021-04-23 ENCOUNTER — Telehealth: Payer: Self-pay

## 2021-04-23 DIAGNOSIS — I509 Heart failure, unspecified: Secondary | ICD-10-CM | POA: Diagnosis not present

## 2021-04-23 DIAGNOSIS — J9611 Chronic respiratory failure with hypoxia: Secondary | ICD-10-CM | POA: Diagnosis not present

## 2021-04-23 DIAGNOSIS — I4891 Unspecified atrial fibrillation: Secondary | ICD-10-CM

## 2021-04-23 DIAGNOSIS — I272 Pulmonary hypertension, unspecified: Secondary | ICD-10-CM | POA: Diagnosis not present

## 2021-04-23 DIAGNOSIS — E1122 Type 2 diabetes mellitus with diabetic chronic kidney disease: Secondary | ICD-10-CM

## 2021-04-23 DIAGNOSIS — Z794 Long term (current) use of insulin: Secondary | ICD-10-CM

## 2021-04-23 MED ORDER — TOUJEO SOLOSTAR 300 UNIT/ML ~~LOC~~ SOPN: 30.0000 [IU] | PEN_INJECTOR | Freq: Every day | SUBCUTANEOUS | 1 refills | Status: AC

## 2021-04-23 MED ORDER — METOPROLOL SUCCINATE ER 25 MG PO TB24: 12.5000 mg | ORAL_TABLET | Freq: Every morning | ORAL | 1 refills | Status: AC

## 2021-04-23 NOTE — Progress Notes (Signed)
Chronic Care Management Pharmacy Assistant   Name: ALEAH AHLGRIM  MRN: [id number] DOB: 06-14-1941  Reason for Encounter: Medication Review/Medication Coordination Call   Recent office visits:  None ID  Recent consult visits:  None ID  Hospital visits:  None in previous 6 months  Medications: Outpatient Encounter Medications as of 04/23/2021  Medication Sig   acetaminophen (TYLENOL) 325 MG tablet Take 650 mg by mouth in the morning and at bedtime.   ambrisentan (LETAIRIS) 5 MG tablet Take by mouth daily.   apixaban (ELIQUIS) 5 MG TABS tablet Take 1 tablet (5 mg total) by mouth 2 (two) times daily.   atorvastatin (LIPITOR) 80 MG tablet Take 1 tablet (80 mg total) by mouth daily.   b complex vitamins capsule Take 1 capsule by mouth daily.   Biotin 1000 MCG tablet Take 1,000 mcg by mouth in the morning and at bedtime.   Cholecalciferol (VITAMIN D3) 50 MCG (2000 UT) TABS Take 2,000 Units by mouth in the morning and at bedtime.   Dulaglutide (TRULICITY) 3 SU/1.1SR SOPN Inject 3 mg as directed once a week. Patient receives through [employer] Patient Assistance through December 2022   ferrous sulfate 325 (65 FE) MG tablet Take 325 mg by mouth daily with breakfast.   glucose blood (ONETOUCH ULTRA) test strip Use to check blood sugar once daily as instructed   HYDROcodone-acetaminophen (NORCO) 7.5-325 MG tablet Take 1 tablet by mouth every 6 (six) hours as needed for moderate pain.   insulin glargine, 1 Unit Dial, (TOUJEO SOLOSTAR) 300 UNIT/ML Solostar Pen Inject 30 Units into the skin daily.   Insulin Pen Needle (BD PEN NEEDLE NANO U/F) 32G X 4 MM MISC Use to inject insulin once daily as directed   Lancets (ONETOUCH ULTRASOFT) lancets Use to check blood sugars once daily as instructed   metoprolol succinate (TOPROL-XL) 25 MG 24 hr tablet Take 0.5 tablets (12.5 mg total) by mouth in the morning.   omeprazole (PRILOSEC) 40 MG capsule TAKE ONE CAPSULE BY MOUTH EVERY MORNING and TAKE ONE  CAPSULE BY MOUTH EVERYDAY AT BEDTIME   PRESCRIPTION MEDICATION CPAP: At bedtime   tadalafil, PAH, (ADCIRCA) 20 MG tablet TAKE 2 TABLETS BY MOUTH AT  BEDTIME   torsemide (DEMADEX) 20 MG tablet Take 1 tablet (20 mg total) by mouth daily.   venlafaxine (EFFEXOR) 75 MG tablet TAKE ONE TABLET BY MOUTH EVERY MORNING   No facility-administered encounter medications on file as of 04/23/2021.   Care Gaps: Urine Microalbumin Hepatitis C Screening Tetanus/TDAP Zoster Vaccines COVID-19 Vaccine Booster 4 Ophthalmology Exam (last completed 09/28/2019) Influenza Vaccine (last completed 07/24/2020) Foot Exam (04/19/2020)  Star Rating Drugs: Trulicity 3 mg Patient Assistance  Atorvastatin 80 mg last filled on 03/29/2021 for a 30-Day supply with [facility]  BP Readings from Last 3 Encounters:  03/20/21 130/70  03/13/21 (!) 118/46  03/11/21 (!) 141/73    Lab Results  Component Value Date   HGBA1C 7.8 (A) 03/11/2021     Patient obtains medications through Adherence Packaging  30 Days   Last adherence delivery included:  Atorvastatin 80 mg one tablet daily - Breakfast  Venlafaxine 75 mg one tablet daily - Breakfast   Omeprazole 40 mg one capsule twice Daily - Breakfast, bedtime Torsemide 20 mg one tablet daily - Breakfast Metoprolol 25 mg 1/2 tablet daily -Breakfast  Patient declined the following medications last month: Ambrisentan 5 mg 1 tablet daily at bedtime - (patient states she receives from specialist clinic) Tadalafil 20 mg  2 tablets daily at bedtime (patient states she receives from specialist clinic) BD Pen Needle Nano - Daily (Adequate supply) Onetouch ultra test strip (Adequate supply) Onetouch ultra soft lancet (Adequate supply) Losartan 25 mg one tablet daily - Bedtime (Nephrology [facility]meet Singh hold losartan) Warfarin 3 mg Daily - Vials (Not taking) Trulicity 1.5 mg Inject 1.5 mg into the skin once a week (patient receiving Trulicity  from [employer]) Eliquis 5 mg 1  tablet 2 times daily (Patient receiving patient assistance)   Patient is due for next adherence delivery on: 04/29/2021 1st Route.  Called patient and reviewed medications and coordinated delivery.  This delivery to include: Atorvastatin 80 mg one tablet daily - Breakfast  Venlafaxine 75 mg one tablet daily - Breakfast   Omeprazole 40 mg one capsule twice Daily - Breakfast, bedtime Torsemide 20 mg one tablet daily - Breakfast Metoprolol 25 mg 1/2 tablet daily -Breakfast Toujeo Solostar U-300 insulin Inject 35 units daily patient added this one.   Patient declined the following medications: Ambrisentan 5 mg 1 tablet daily at bedtime - (patient states she receives from specialist clinic) Tadalafil 20 mg 2 tablets daily at bedtime (patient states she receives from specialist clinic) BD Pen Needle Nano - Daily (Adequate supply) Onetouch ultra test strip (Adequate supply) Onetouch ultra soft lancet (Adequate supply) Trulicity 1.5 mg Inject 1.5 mg into the skin once a week (patient receiving Trulicity  from [employer]) Eliquis 5 mg 1 tablet 2 times daily (Patient receiving patient assistance)  Patient needs refills for Metoprolol 25 mg 1/2 tablet daily, and Toujeo 35 units daily.  Confirmed delivery date of 04/29/2021 1st Route , advised patient that pharmacy will contact them the morning of delivery.  Patient has an upcoming appointment with Junius Argyle, CPP on 06/07/2021 _0 .   Lynann Bologna, CPA/CMA Clinical Pharmacist Assistant Phone: [phone]

## 2021-04-23 NOTE — Addendum Note (Signed)
Addended by: Daron Offer A on: 04/23/2021 01:02 PM   Modules accepted: Orders

## 2021-05-01 DIAGNOSIS — E1122 Type 2 diabetes mellitus with diabetic chronic kidney disease: Secondary | ICD-10-CM | POA: Diagnosis not present

## 2021-05-01 DIAGNOSIS — N1832 Chronic kidney disease, stage 3b: Secondary | ICD-10-CM | POA: Diagnosis not present

## 2021-05-02 DIAGNOSIS — E1122 Type 2 diabetes mellitus with diabetic chronic kidney disease: Secondary | ICD-10-CM | POA: Diagnosis not present

## 2021-05-02 DIAGNOSIS — N184 Chronic kidney disease, stage 4 (severe): Secondary | ICD-10-CM | POA: Diagnosis not present

## 2021-05-03 ENCOUNTER — Other Ambulatory Visit: Payer: Self-pay

## 2021-05-03 ENCOUNTER — Other Ambulatory Visit: Payer: Self-pay | Admitting: Internal Medicine

## 2021-05-03 MED ORDER — AMBRISENTAN 5 MG PO TABS: 5.0000 mg | ORAL_TABLET | Freq: Every day | ORAL | 2 refills | Status: AC

## 2021-05-17 ENCOUNTER — Telehealth: Payer: Self-pay

## 2021-05-17 NOTE — Progress Notes (Addendum)
Chronic Care Management Pharmacy Assistant   Name: Ann Flynn  MRN: [id number] DOB: [birth year]  Reason for Encounter: Medication Review/Medication Coordination Call.   Recent office visits:  No recent office visit  Recent consult visits:  05/02/2021 Dr.Singh MD (Nephrology) Return in 1 week for lab, restarted losartan 25 mg daily, Return in about 4 months   Hospital visits:  None in previous 6 months  Medications: Outpatient Encounter Medications as of 05/17/2021  Medication Sig   acetaminophen (TYLENOL) 325 MG tablet Take 650 mg by mouth in the morning and at bedtime.   ambrisentan (LETAIRIS) 5 MG tablet Take 1 tablet (5 mg total) by mouth daily.   apixaban (ELIQUIS) 5 MG TABS tablet Take 1 tablet (5 mg total) by mouth 2 (two) times daily.   atorvastatin (LIPITOR) 80 MG tablet Take 1 tablet (80 mg total) by mouth daily.   b complex vitamins capsule Take 1 capsule by mouth daily.   Biotin 1000 MCG tablet Take 1,000 mcg by mouth in the morning and at bedtime.   Cholecalciferol (VITAMIN D3) 50 MCG (2000 UT) TABS Take 2,000 Units by mouth in the morning and at bedtime.   Dulaglutide (TRULICITY) 3 HK/7.4QV SOPN Inject 3 mg as directed once a week. Patient receives through [employer] Patient Assistance through December 2022   ferrous sulfate 325 (65 FE) MG tablet Take 325 mg by mouth daily with breakfast.   glucose blood (ONETOUCH ULTRA) test strip Use to check blood sugar once daily as instructed   HYDROcodone-acetaminophen (NORCO) 7.5-325 MG tablet Take 1 tablet by mouth every 6 (six) hours as needed for moderate pain.   insulin glargine, 1 Unit Dial, (TOUJEO SOLOSTAR) 300 UNIT/ML Solostar Pen Inject 30 Units into the skin daily.   Insulin Pen Needle (BD PEN NEEDLE NANO U/F) 32G X 4 MM MISC Use to inject insulin once daily as directed   Lancets (ONETOUCH ULTRASOFT) lancets Use to check blood sugars once daily as instructed   metoprolol succinate (TOPROL-XL) 25 MG 24 hr tablet  Take 0.5 tablets (12.5 mg total) by mouth in the morning.   omeprazole (PRILOSEC) 40 MG capsule TAKE ONE CAPSULE BY MOUTH EVERY MORNING and TAKE ONE CAPSULE BY MOUTH EVERYDAY AT BEDTIME   PRESCRIPTION MEDICATION CPAP: At bedtime   tadalafil, PAH, (ADCIRCA) 20 MG tablet TAKE 2 TABLETS BY MOUTH AT  BEDTIME   torsemide (DEMADEX) 20 MG tablet Take 1 tablet (20 mg total) by mouth daily.   venlafaxine (EFFEXOR) 75 MG tablet TAKE ONE TABLET BY MOUTH EVERY MORNING   No facility-administered encounter medications on file as of 05/17/2021.    Care Gaps: Urine Microalbumin Tetanus/TDAP Zoster Vaccines COVID-19 Vaccine Booster 4 Ophthalmology Exam (last completed 09/28/2019) Influenza Vaccine (last completed 07/24/2020) Foot Exam (04/19/2020)   Star Rating Drugs: Trulicity 3 mg Patient Assistance  Atorvastatin 80 mg last filled on 04/24/2021 for a 30-Day supply with [facility] Losartan 25 mg last filled on 05/02/2021 for 29 day supply at [facility].   Reviewed chart for medication changes ahead of medication coordination call.   BP Readings from Last 3 Encounters:  03/20/21 130/70  03/13/21 (!) 118/46  03/11/21 (!) 141/73    Lab Results  Component Value Date   HGBA1C 7.8 (A) 03/11/2021     Patient obtains medications through Adherence Packaging  30 Days   Last adherence delivery included:  Atorvastatin 80 mg one tablet daily - Breakfast  Venlafaxine 75 mg one tablet daily - Breakfast  Omeprazole 40 mg one capsule twice Daily - Breakfast, bedtime Torsemide 20 mg one tablet daily - Breakfast Metoprolol 25 mg 1/2 tablet daily -Breakfast Toujeo Solostar U-300 insulin Inject 35 units daily patient added this one.   Patient declined medications  last month  Ambrisentan 5 mg 1 tablet daily at bedtime - (patient states she receives from specialist clinic) Tadalafil 20 mg 2 tablets daily at bedtime (patient states she receives from specialist clinic) BD Pen Needle Nano -  Daily (Adequate supply) Onetouch ultra test strip (Adequate supply) Onetouch ultra soft lancet (Adequate supply) Trulicity 1.5 mg Inject 1.5 mg into the skin once a week (patient receiving Trulicity  from [employer]) Eliquis 5 mg 1 tablet 2 times daily (Patient receiving patient assistance)  Patient is due for next adherence delivery on: 05/29/2021. Called patient and reviewed medications and coordinated delivery.  This delivery to include: Atorvastatin 80 mg one tablet daily - Breakfast  Venlafaxine 75 mg one tablet daily - Breakfast   Omeprazole 40 mg one capsule twice Daily - Breakfast, bedtime Torsemide 20 mg one tablet daily - Breakfast Metoprolol 25 mg 1/2 tablet daily -Breakfast Toujeo Solostar U-300 insulin Inject 30 units daily  Losartan 25 mg one tablet daily- Breakfast BD Pen Needle Nano - Daily  Onetouch ultra test strip Onetouch ultra soft lancet   Patient declined the following medications  Ambrisentan 5 mg 1 tablet daily at bedtime - (patient states she receives from specialist clinic) Tadalafil 20 mg 2 tablets daily at bedtime (patient states she receives from specialist clinic) Trulicity 3 mg Inject 3 mg into the skin once a week (patient receiving Trulicity  from [employer]) Eliquis 5 mg 1 tablet 2 times daily (Patient receiving patient assistance)  Patient needs refills for None.  Confirmed delivery date of 05/29/2021, advised patient that pharmacy will contact them the morning of delivery.  Telephone follow up appointment with Care management team member scheduled for : 06/07/2021 at 11:00 am.  [place] Pharmacist Assistant [phone]

## 2021-05-18 ENCOUNTER — Emergency Department
Admission: EM | Admit: 2021-05-18 | Discharge: 2021-05-18 | Disposition: A | Discharge status: Home / Self Care | Payer: Medicare Other | Source: Home / Self Care | Attending: Emergency Medicine | Admitting: Emergency Medicine

## 2021-05-18 ENCOUNTER — Other Ambulatory Visit: Payer: Self-pay

## 2021-05-18 ENCOUNTER — Emergency Department: Payer: Medicare Other

## 2021-05-18 DIAGNOSIS — M25552 Pain in left hip: Secondary | ICD-10-CM | POA: Diagnosis not present

## 2021-05-18 DIAGNOSIS — Z794 Long term (current) use of insulin: Secondary | ICD-10-CM | POA: Insufficient documentation

## 2021-05-18 DIAGNOSIS — I517 Cardiomegaly: Secondary | ICD-10-CM | POA: Diagnosis not present

## 2021-05-18 DIAGNOSIS — F32A Depression, unspecified: Secondary | ICD-10-CM | POA: Diagnosis not present

## 2021-05-18 DIAGNOSIS — E611 Iron deficiency: Secondary | ICD-10-CM | POA: Diagnosis not present

## 2021-05-18 DIAGNOSIS — N184 Chronic kidney disease, stage 4 (severe): Secondary | ICD-10-CM | POA: Insufficient documentation

## 2021-05-18 DIAGNOSIS — R069 Unspecified abnormalities of breathing: Secondary | ICD-10-CM | POA: Diagnosis not present

## 2021-05-18 DIAGNOSIS — X58XXXA Exposure to other specified factors, initial encounter: Secondary | ICD-10-CM | POA: Insufficient documentation

## 2021-05-18 DIAGNOSIS — E785 Hyperlipidemia, unspecified: Secondary | ICD-10-CM | POA: Insufficient documentation

## 2021-05-18 DIAGNOSIS — R52 Pain, unspecified: Secondary | ICD-10-CM | POA: Diagnosis not present

## 2021-05-18 DIAGNOSIS — E1169 Type 2 diabetes mellitus with other specified complication: Secondary | ICD-10-CM | POA: Insufficient documentation

## 2021-05-18 DIAGNOSIS — E1121 Type 2 diabetes mellitus with diabetic nephropathy: Secondary | ICD-10-CM | POA: Diagnosis not present

## 2021-05-18 DIAGNOSIS — I4891 Unspecified atrial fibrillation: Secondary | ICD-10-CM | POA: Insufficient documentation

## 2021-05-18 DIAGNOSIS — I959 Hypotension, unspecified: Secondary | ICD-10-CM | POA: Diagnosis not present

## 2021-05-18 DIAGNOSIS — I509 Heart failure, unspecified: Secondary | ICD-10-CM | POA: Insufficient documentation

## 2021-05-18 DIAGNOSIS — I110 Hypertensive heart disease with heart failure: Secondary | ICD-10-CM | POA: Insufficient documentation

## 2021-05-18 DIAGNOSIS — Z515 Encounter for palliative care: Secondary | ICD-10-CM | POA: Diagnosis not present

## 2021-05-18 DIAGNOSIS — E1165 Type 2 diabetes mellitus with hyperglycemia: Secondary | ICD-10-CM | POA: Diagnosis not present

## 2021-05-18 DIAGNOSIS — E1122 Type 2 diabetes mellitus with diabetic chronic kidney disease: Secondary | ICD-10-CM | POA: Insufficient documentation

## 2021-05-18 DIAGNOSIS — R0602 Shortness of breath: Secondary | ICD-10-CM | POA: Diagnosis not present

## 2021-05-18 DIAGNOSIS — Z79899 Other long term (current) drug therapy: Secondary | ICD-10-CM | POA: Insufficient documentation

## 2021-05-18 DIAGNOSIS — Z7901 Long term (current) use of anticoagulants: Secondary | ICD-10-CM | POA: Insufficient documentation

## 2021-05-18 DIAGNOSIS — N189 Chronic kidney disease, unspecified: Secondary | ICD-10-CM | POA: Diagnosis not present

## 2021-05-18 DIAGNOSIS — R0689 Other abnormalities of breathing: Secondary | ICD-10-CM | POA: Diagnosis not present

## 2021-05-18 DIAGNOSIS — R531 Weakness: Secondary | ICD-10-CM | POA: Diagnosis not present

## 2021-05-18 DIAGNOSIS — Z743 Need for continuous supervision: Secondary | ICD-10-CM | POA: Diagnosis not present

## 2021-05-18 DIAGNOSIS — D631 Anemia in chronic kidney disease: Secondary | ICD-10-CM | POA: Diagnosis not present

## 2021-05-18 DIAGNOSIS — M25562 Pain in left knee: Secondary | ICD-10-CM | POA: Diagnosis not present

## 2021-05-18 DIAGNOSIS — I4892 Unspecified atrial flutter: Secondary | ICD-10-CM | POA: Diagnosis not present

## 2021-05-18 DIAGNOSIS — R6889 Other general symptoms and signs: Secondary | ICD-10-CM | POA: Diagnosis not present

## 2021-05-18 DIAGNOSIS — M4312 Spondylolisthesis, cervical region: Secondary | ICD-10-CM | POA: Diagnosis not present

## 2021-05-18 DIAGNOSIS — M47812 Spondylosis without myelopathy or radiculopathy, cervical region: Secondary | ICD-10-CM | POA: Diagnosis not present

## 2021-05-18 DIAGNOSIS — S161XXA Strain of muscle, fascia and tendon at neck level, initial encounter: Secondary | ICD-10-CM | POA: Insufficient documentation

## 2021-05-18 DIAGNOSIS — I2782 Chronic pulmonary embolism: Secondary | ICD-10-CM | POA: Diagnosis not present

## 2021-05-18 DIAGNOSIS — E871 Hypo-osmolality and hyponatremia: Secondary | ICD-10-CM | POA: Diagnosis not present

## 2021-05-18 DIAGNOSIS — I5033 Acute on chronic diastolic (congestive) heart failure: Secondary | ICD-10-CM | POA: Diagnosis not present

## 2021-05-18 DIAGNOSIS — E875 Hyperkalemia: Secondary | ICD-10-CM | POA: Diagnosis not present

## 2021-05-18 DIAGNOSIS — J9621 Acute and chronic respiratory failure with hypoxia: Secondary | ICD-10-CM | POA: Diagnosis not present

## 2021-05-18 DIAGNOSIS — Z20822 Contact with and (suspected) exposure to covid-19: Secondary | ICD-10-CM | POA: Diagnosis not present

## 2021-05-18 DIAGNOSIS — R001 Bradycardia, unspecified: Secondary | ICD-10-CM | POA: Diagnosis not present

## 2021-05-18 DIAGNOSIS — E860 Dehydration: Secondary | ICD-10-CM | POA: Diagnosis not present

## 2021-05-18 DIAGNOSIS — I482 Chronic atrial fibrillation, unspecified: Secondary | ICD-10-CM | POA: Diagnosis not present

## 2021-05-18 DIAGNOSIS — N139 Obstructive and reflux uropathy, unspecified: Secondary | ICD-10-CM | POA: Diagnosis not present

## 2021-05-18 DIAGNOSIS — I272 Pulmonary hypertension, unspecified: Secondary | ICD-10-CM | POA: Diagnosis not present

## 2021-05-18 DIAGNOSIS — I480 Paroxysmal atrial fibrillation: Secondary | ICD-10-CM | POA: Diagnosis not present

## 2021-05-18 DIAGNOSIS — N179 Acute kidney failure, unspecified: Secondary | ICD-10-CM | POA: Diagnosis not present

## 2021-05-18 DIAGNOSIS — G931 Anoxic brain damage, not elsewhere classified: Secondary | ICD-10-CM | POA: Diagnosis not present

## 2021-05-18 LAB — CBC
HCT: 29.3 % — ABNORMAL LOW (ref 36.0–46.0)
Hemoglobin: 8.9 g/dL — ABNORMAL LOW (ref 12.0–15.0)
MCH: 30.4 pg (ref 26.0–34.0)
MCHC: 30.4 g/dL (ref 30.0–36.0)
MCV: 100 fL (ref 80.0–100.0)
Platelets: 214 10*3/uL (ref 150–400)
RBC: 2.93 MIL/uL — ABNORMAL LOW (ref 3.87–5.11)
RDW: 15.5 % (ref 11.5–15.5)
WBC: 10.5 10*3/uL (ref 4.0–10.5)
nRBC: 0 % (ref 0.0–0.2)

## 2021-05-18 LAB — BASIC METABOLIC PANEL
Anion gap: 9 (ref 5–15)
BUN: 40 mg/dL — ABNORMAL HIGH (ref 8–23)
CO2: 26 mmol/L (ref 22–32)
Calcium: 8.6 mg/dL — ABNORMAL LOW (ref 8.9–10.3)
Chloride: 103 mmol/L (ref 98–111)
Creatinine, Ser: 1.81 mg/dL — ABNORMAL HIGH (ref 0.44–1.00)
GFR, Estimated: 28 mL/min — ABNORMAL LOW (ref >60)
Glucose, Bld: 197 mg/dL — ABNORMAL HIGH (ref 70–99)
Potassium: 4.9 mmol/L (ref 3.5–5.1)
Sodium: 138 mmol/L (ref 135–145)

## 2021-05-18 LAB — TROPONIN I (HIGH SENSITIVITY): Troponin I (High Sensitivity): 12 ng/L (ref <18)

## 2021-05-18 MED ORDER — LIDOCAINE 5 % EX PTCH: 1.0000 | MEDICATED_PATCH | Freq: Two times a day (BID) | CUTANEOUS | 0 refills | Status: AC

## 2021-05-18 MED ORDER — LIDOCAINE 5 % EX PTCH
1.0000 | MEDICATED_PATCH | CUTANEOUS | Status: DC
  Administered 2021-05-18: 1 via TRANSDERMAL
  Filled 2021-05-18: qty 1

## 2021-05-18 MED ORDER — DICLOFENAC SODIUM 1 % EX GEL: 2.0000 g | Freq: Four times a day (QID) | CUTANEOUS | 0 refills | Status: AC

## 2021-05-18 NOTE — ED Provider Notes (Signed)
[facility] Emergency Department Provider Note  ____________________________________________  Time seen: Approximately 4:02 PM  I have reviewed the triage vital signs and the nursing notes.   HISTORY  Chief Complaint Neck Pain and Shortness of Breath    HPI Ann Flynn is a 80 y.o. female with a history of atrial fibrillation status post ablation, CHF CKD diabetes hypertension on Eliquis who comes ED complaining of right neck pain that started after lunch, gradual onset, constant, worse with movement particularly neck movement, nonradiating, no chest pain or shortness of breath, not exertional symptoms.    Patient reports that she was moving boxes in her garage this morning.  Patient demonstrates that the pain involves the right lateral neck along the course of the trapezius to the [facility] joint.   Past Medical History:  Diagnosis Date   A-fib ([place])    Anemia    CHF (congestive heart failure) (HCC)    Chronic kidney disease    Diabetes mellitus without complication ([place])    Hypertension    Other cervical disc degeneration at C4-C5 level    severe _0  5   moderate 2 3 level   Pulmonary hypertension ([place])    Subdural hematoma, acute 03/24/2020   Syncope 07/2020     Patient Active Problem List   Diagnosis Date Noted   DDD (degenerative disc disease), lumbar 12/14/2020   A-fib ([place]) 12/06/2020   Atrial flutter with rapid ventricular response ([place])    DDD (degenerative disc disease), cervical 11/27/2020   Syncope and collapse 08/05/2020   Atrial fibrillation, chronic ([place]) 03/24/2020   Type 2 diabetes mellitus with stage 4 chronic kidney disease ([place]) 03/24/2020   Syncope 03/24/2020   Pulmonary hypertension ([place]) 03/24/2020   Pulmonary nodules 01/30/2020   Chronic pulmonary embolism, unspecified pulmonary embolism type, unspecified whether acute cor pulmonale present ([place]) 10/06/2019   Chronic respiratory failure with hypoxia ([place]) 10/06/2019    Morbid (severe) obesity due to excess calories ([place]) 10/06/2019   Hypertension associated with diabetes ([place]) 10/06/2019   Hyperlipidemia associated with type 2 diabetes mellitus ([place]) 10/06/2019   CKD (chronic kidney disease), stage IV ([place]) 04/01/2019   Allergic rhinitis 03/28/2019   Arthritis 03/28/2019   CHF (congestive heart failure) ([place]) 03/28/2019   Sleep apnea 03/28/2019   Pulmonary hypertension, primary ([place]) 03/28/2019     Past Surgical History:  Procedure Laterality Date   A-FLUTTER ABLATION N/A 01/18/2021   Procedure: A-FLUTTER ABLATION;  Surgeon: Constance Haw, MD;  Location: [place] [facility];  Service: Cardiovascular;  Laterality: N/A;   ABDOMINAL HYSTERECTOMY  1997   ABDOMINAL HYSTERECTOMY     CARDIOVERSION N/A 07/10/2020   Procedure: CARDIOVERSION;  Surgeon: Jolaine Artist, MD;  Location: [place];  Service: Cardiovascular;  Laterality: N/A;   CARDIOVERSION N/A 12/07/2020   Procedure: CARDIOVERSION;  Surgeon: Jolaine Artist, MD;  Location: [facility] ENDOSCOPY;  Service: Cardiovascular;  Laterality: N/A;   cartiod artery surgery  2010   per patient    CHOLECYSTECTOMY  10/19/2009   masectomy     [place]   RIGHT HEART CATH N/A 07/29/2019   Procedure: RIGHT HEART CATH;  Surgeon: Jolaine Artist, MD;  Location: [place] [facility];  Service: Cardiovascular;  Laterality: N/A;   RIGHT HEART CATH N/A 05/11/2020   Procedure: RIGHT HEART CATH;  Surgeon: Jolaine Artist, MD;  Location: [place] [facility];  Service: Cardiovascular;  Laterality: N/A;   RIGHT HEART CATH N/A 08/07/2020   Procedure: RIGHT HEART  CATH;  Surgeon: Jolaine Artist, MD;  Location: [place] [facility];  Service: Cardiovascular;  Laterality: N/A;     Prior to Admission medications   Medication Sig Start Date End Date Taking? Authorizing Provider  diclofenac Sodium (VOLTAREN) 1 % GEL Apply 2 g topically 4 (four) times daily. 05/18/21  Yes Carrie Mew, MD  lidocaine  (LIDODERM) 5 % Place 1 patch onto the skin every 12 (twelve) hours. Remove & Discard patch within 12 hours or as directed by MD 05/18/21  Yes Carrie Mew, MD  acetaminophen (TYLENOL) 325 MG tablet Take 650 mg by mouth in the morning and at bedtime.    [provider]  ambrisentan (LETAIRIS) 5 MG tablet Take 1 tablet (5 mg total) by mouth daily. 05/03/21   Flora Lipps, MD  apixaban (ELIQUIS) 5 MG TABS tablet Take 1 tablet (5 mg total) by mouth 2 (two) times daily. 02/05/21 05/06/21  Camnitz, Ocie Doyne, MD  atorvastatin (LIPITOR) 80 MG tablet Take 1 tablet (80 mg total) by mouth daily. 09/24/20   Trinna Post, PA-C  b complex vitamins capsule Take 1 capsule by mouth daily.    [provider]  Biotin 1000 MCG tablet Take 1,000 mcg by mouth in the morning and at bedtime.    [provider]  Cholecalciferol (VITAMIN D3) 50 MCG (2000 UT) TABS Take 2,000 Units by mouth in the morning and at bedtime.    [provider]  Dulaglutide (TRULICITY) 3 OJ/6.5QU SOPN Inject 3 mg as directed once a week. Patient receives through [employer] Patient Assistance through December 2022 04/15/21   Birdie Sons, MD  ferrous sulfate 325 (65 FE) MG tablet Take 325 mg by mouth daily with breakfast.    [provider]  glucose blood (ONETOUCH ULTRA) test strip Use to check blood sugar once daily as instructed 01/22/21   Birdie Sons, MD  HYDROcodone-acetaminophen (NORCO) 7.5-325 MG tablet Take 1 tablet by mouth every 6 (six) hours as needed for moderate pain. 01/01/21   Birdie Sons, MD  insulin glargine, 1 Unit Dial, (TOUJEO SOLOSTAR) 300 UNIT/ML Solostar Pen Inject 30 Units into the skin daily. 04/23/21   Birdie Sons, MD  Insulin Pen Needle (BD PEN NEEDLE NANO U/F) 32G X 4 MM MISC Use to inject insulin once daily as directed 12/26/20   Birdie Sons, MD  Lancets [facility] ULTRASOFT) lancets Use to check blood sugars once daily as instructed 01/22/21   Birdie Sons, MD  metoprolol succinate (TOPROL-XL) 25 MG 24 hr tablet Take 0.5 tablets (12.5 mg total) by mouth in the morning. 04/23/21   Birdie Sons, MD  omeprazole (PRILOSEC) 40 MG capsule TAKE ONE CAPSULE BY MOUTH EVERY MORNING and TAKE ONE CAPSULE BY MOUTH EVERYDAY AT BEDTIME 03/29/21   Birdie Sons, MD  PRESCRIPTION MEDICATION CPAP: At bedtime    [provider]  tadalafil, PAH, (ADCIRCA) 20 MG tablet TAKE 2 TABLETS BY MOUTH AT  BEDTIME 04/15/21   Flora Lipps, MD  torsemide (DEMADEX) 20 MG tablet Take 1 tablet (20 mg total) by mouth daily. 01/21/21   Lyda Jester M, PA-C  venlafaxine (EFFEXOR) 75 MG tablet TAKE ONE TABLET BY MOUTH EVERY MORNING 03/29/21   Birdie Sons, MD     Allergies Penicillins, Sulfa antibiotics, and Uptravi [selexipag]   Family History  Problem Relation Age of Onset   CAD Mother    Heart disease Father    CAD Maternal Grandmother  Social History Social History   Tobacco Use   Smoking status: Never   Smokeless tobacco: Never  Vaping Use   Vaping Use: Never used  Substance Use Topics   Alcohol use: Never   Drug use: Never    Review of Systems  Constitutional:   No fever or chills.  ENT:   No sore throat. No rhinorrhea. Cardiovascular:   No chest pain or syncope. Respiratory:   No dyspnea or cough. Gastrointestinal:   Negative for abdominal pain, vomiting and diarrhea.  Musculoskeletal:   Positive neck pain as above All other systems reviewed and are negative except as documented above in ROS and HPI.  ____________________________________________   PHYSICAL EXAM:  VITAL SIGNS: ED Triage Vitals  Enc Vitals Group     BP 05/18/21 1356 (!) 112/47     Pulse Rate 05/18/21 1356 65     Resp 05/18/21 1356 (!) 25     Temp 05/18/21 1356 98.5 F (36.9 C)     Temp Source 05/18/21 1356 Oral     SpO2 05/18/21 1356 93 %     Weight 05/18/21 1355 235 lb (106.6 kg)     Height 05/18/21 1355 _0  (1.651 m)     Head Circumference  --      Peak Flow --      Pain Score 05/18/21 1354 9     Pain Loc --      Pain Edu? --      Excl. in [place]? --     Vital signs reviewed, nursing assessments reviewed.   Constitutional:   Alert and oriented. Non-toxic appearance. Eyes:   Conjunctivae are normal. EOMI. PERRL. ENT      Head:   Normocephalic and atraumatic.      Nose:   Wearing a mask.      Mouth/Throat:   Wearing a mask.      Neck:   No meningismus. Full ROM. Hematological/Lymphatic/Immunilogical:   No cervical lymphadenopathy. Cardiovascular:   RRR. Symmetric bilateral radial and DP pulses.  No murmurs. Cap refill less than 2 seconds. Respiratory:   Normal respiratory effort without tachypnea/retractions. Breath sounds are clear and equal bilaterally. No wheezes/rales/rhonchi. Gastrointestinal:   Soft and nontender. Non distended. There is no CVA tenderness.  No rebound, rigidity, or guarding. Genitourinary:   deferred Musculoskeletal:   Normal range of motion in all extremities. There is exquisite tenderness in the right side trapezius musculature which reproduces her pain. The muscle belly is tense.  Pain is reproduced by shrugging shoulder against resistance. No motor wewakness.  Neurologic:   Normal speech and language.  Motor grossly intact. No acute focal neurologic deficits are appreciated.  Skin:    Skin is warm, dry and intact. No rash noted.  No petechiae, purpura, or bullae.  ____________________________________________    LABS (pertinent positives/negatives) (all labs ordered are listed, but only abnormal results are displayed) Labs Reviewed  BASIC METABOLIC PANEL - Abnormal; Notable for the following components:      Result Value   Glucose, Bld 197 (*)    BUN 40 (*)    Creatinine, Ser 1.81 (*)    Calcium 8.6 (*)    GFR, Estimated 28 (*)    All other components within normal limits  CBC - Abnormal; Notable for the following components:   RBC 2.93 (*)    Hemoglobin 8.9 (*)    HCT 29.3 (*)    All  other components within normal limits  TROPONIN I (HIGH SENSITIVITY)   ____________________________________________  EKG  Interpreted by me Sinus bradycardia rate of 58, normal axis and intervals.  Normal QRS ST segments and T waves.  ____________________________________________    CLEXNTZGY  DG Chest 2 View  Result Date: 05/18/2021 CLINICAL DATA:  Shortness of breath. EXAM: CHEST - 2 VIEW COMPARISON:  Chest x-ray dated December 06, 2020. FINDINGS: Unchanged mild to moderate cardiomegaly. Normal pulmonary vascularity. No focal consolidation, pleural effusion, or pneumothorax. No acute osseous abnormality. IMPRESSION: 1. No acute cardiopulmonary disease. Electronically Signed   By: Titus Dubin M.D.   On: 05/18/2021 15:06    ____________________________________________   PROCEDURES Procedures  ____________________________________________    CLINICAL IMPRESSION / ASSESSMENT AND PLAN / ED COURSE  Medications ordered in the ED: Medications  lidocaine (LIDODERM) 5 % 1 patch (has no administration in time range)    Pertinent labs & imaging results that were available during my care of the patient were reviewed by me and considered in my medical decision making (see chart for details).  Ann Flynn was evaluated in Emergency Department on 05/18/2021 for the symptoms described in the history of present illness. She was evaluated in the context of the global COVID-19 pandemic, which necessitated consideration that the patient might be at risk for infection with the SARS-CoV-2 virus that causes COVID-19. Institutional protocols and algorithms that pertain to the evaluation of patients at risk for COVID-19 are in a state of rapid change based on information released by regulatory bodies including the CDC and federal and state organizations. These policies and algorithms were followed during the patient's care in the ED.   Patient presents with right-sided neck pain which is clearly  musculoskeletal on exam, appears to be a trapezius strain.  She had EKG chest x-ray and labs which were all unremarkable, cardiac work-up is not indicated. Considering the patient's symptoms, medical history, and physical examination today, I have low suspicion for ACS, PE, TAD, pneumothorax, carditis, mediastinitis, pneumonia, CHF, or sepsis. Highly doubt vascular issue with the carotid jugular or subclavian vessels, meningitis or C-spine injury.  Will treat with Voltaren gel, Lidoderm, range of motion exercises, ice today     ____________________________________________   FINAL CLINICAL IMPRESSION(S) / ED DIAGNOSES    Final diagnoses:  Strain of neck muscle, initial encounter     ED Discharge Orders          Ordered    lidocaine (LIDODERM) 5 %  Every 12 hours        05/18/21 1556    diclofenac Sodium (VOLTAREN) 1 % GEL  4 times daily        05/18/21 1556            Portions of this note were generated with dragon dictation software. Dictation errors may occur despite best attempts at proofreading.    Carrie Mew, MD 05/18/21 [phone]

## 2021-05-18 NOTE — Discharge Instructions (Signed)
Use ice on your right side of neck today to help alleviate pain.  Lidocaine patch and topical diclofenac can also help relieve pain.  In 1-2 days, start using a gentle heating pad to soothe the area, and you can begin neck exercises to help return to normal and prevent further injury in the future.  Do each exercise listed in these instruction 10 times per session.

## 2021-05-18 NOTE — ED Notes (Signed)
Patient stable and discharged with all personal belongings and AVS. AVS and discharge instructions reviewed with patient and opportunity for questions provided.

## 2021-05-18 NOTE — ED Triage Notes (Signed)
Pt comes pov with right sided neck pain and SOB starting about lunch time. Pt on chronic 3L . Concerned for her heart. Has CHF.

## 2021-05-20 ENCOUNTER — Emergency Department: Payer: Medicare Other

## 2021-05-20 ENCOUNTER — Ambulatory Visit: Payer: Self-pay | Admitting: *Deleted

## 2021-05-20 ENCOUNTER — Inpatient Hospital Stay
Admission: EM | Admit: 2021-05-20 | Discharge: 2021-06-18 | DRG: 682 | Disposition: E | Payer: Medicare Other | Attending: Internal Medicine | Admitting: Internal Medicine

## 2021-05-20 ENCOUNTER — Other Ambulatory Visit: Payer: Self-pay

## 2021-05-20 DIAGNOSIS — M542 Cervicalgia: Secondary | ICD-10-CM | POA: Diagnosis present

## 2021-05-20 DIAGNOSIS — R531 Weakness: Secondary | ICD-10-CM

## 2021-05-20 DIAGNOSIS — R0602 Shortness of breath: Secondary | ICD-10-CM

## 2021-05-20 DIAGNOSIS — I272 Pulmonary hypertension, unspecified: Secondary | ICD-10-CM | POA: Diagnosis present

## 2021-05-20 DIAGNOSIS — N139 Obstructive and reflux uropathy, unspecified: Secondary | ICD-10-CM | POA: Diagnosis present

## 2021-05-20 DIAGNOSIS — Z7985 Long-term (current) use of injectable non-insulin antidiabetic drugs: Secondary | ICD-10-CM

## 2021-05-20 DIAGNOSIS — M25569 Pain in unspecified knee: Secondary | ICD-10-CM

## 2021-05-20 DIAGNOSIS — T502X5A Adverse effect of carbonic-anhydrase inhibitors, benzothiadiazides and other diuretics, initial encounter: Secondary | ICD-10-CM | POA: Diagnosis not present

## 2021-05-20 DIAGNOSIS — M50321 Other cervical disc degeneration at C4-C5 level: Secondary | ICD-10-CM | POA: Diagnosis present

## 2021-05-20 DIAGNOSIS — E1121 Type 2 diabetes mellitus with diabetic nephropathy: Secondary | ICD-10-CM | POA: Diagnosis present

## 2021-05-20 DIAGNOSIS — E1165 Type 2 diabetes mellitus with hyperglycemia: Secondary | ICD-10-CM | POA: Diagnosis not present

## 2021-05-20 DIAGNOSIS — D631 Anemia in chronic kidney disease: Secondary | ICD-10-CM | POA: Diagnosis present

## 2021-05-20 DIAGNOSIS — D72829 Elevated white blood cell count, unspecified: Secondary | ICD-10-CM | POA: Diagnosis present

## 2021-05-20 DIAGNOSIS — F32A Depression, unspecified: Secondary | ICD-10-CM | POA: Diagnosis present

## 2021-05-20 DIAGNOSIS — I480 Paroxysmal atrial fibrillation: Secondary | ICD-10-CM | POA: Diagnosis present

## 2021-05-20 DIAGNOSIS — Z7901 Long term (current) use of anticoagulants: Secondary | ICD-10-CM

## 2021-05-20 DIAGNOSIS — I2782 Chronic pulmonary embolism: Secondary | ICD-10-CM | POA: Diagnosis present

## 2021-05-20 DIAGNOSIS — N179 Acute kidney failure, unspecified: Principal | ICD-10-CM | POA: Diagnosis present

## 2021-05-20 DIAGNOSIS — E871 Hypo-osmolality and hyponatremia: Secondary | ICD-10-CM | POA: Diagnosis present

## 2021-05-20 DIAGNOSIS — Z880 Allergy status to penicillin: Secondary | ICD-10-CM

## 2021-05-20 DIAGNOSIS — Z515 Encounter for palliative care: Secondary | ICD-10-CM

## 2021-05-20 DIAGNOSIS — N289 Disorder of kidney and ureter, unspecified: Secondary | ICD-10-CM

## 2021-05-20 DIAGNOSIS — Z20822 Contact with and (suspected) exposure to covid-19: Secondary | ICD-10-CM | POA: Diagnosis present

## 2021-05-20 DIAGNOSIS — N184 Chronic kidney disease, stage 4 (severe): Secondary | ICD-10-CM | POA: Diagnosis present

## 2021-05-20 DIAGNOSIS — M5136 Other intervertebral disc degeneration, lumbar region: Secondary | ICD-10-CM | POA: Diagnosis present

## 2021-05-20 DIAGNOSIS — I482 Chronic atrial fibrillation, unspecified: Secondary | ICD-10-CM | POA: Diagnosis present

## 2021-05-20 DIAGNOSIS — I130 Hypertensive heart and chronic kidney disease with heart failure and stage 1 through stage 4 chronic kidney disease, or unspecified chronic kidney disease: Secondary | ICD-10-CM | POA: Diagnosis present

## 2021-05-20 DIAGNOSIS — Z882 Allergy status to sulfonamides status: Secondary | ICD-10-CM

## 2021-05-20 DIAGNOSIS — Z8249 Family history of ischemic heart disease and other diseases of the circulatory system: Secondary | ICD-10-CM

## 2021-05-20 DIAGNOSIS — E875 Hyperkalemia: Secondary | ICD-10-CM | POA: Diagnosis present

## 2021-05-20 DIAGNOSIS — Z794 Long term (current) use of insulin: Secondary | ICD-10-CM

## 2021-05-20 DIAGNOSIS — I110 Hypertensive heart disease with heart failure: Secondary | ICD-10-CM | POA: Diagnosis present

## 2021-05-20 DIAGNOSIS — I4892 Unspecified atrial flutter: Secondary | ICD-10-CM | POA: Diagnosis present

## 2021-05-20 DIAGNOSIS — E1122 Type 2 diabetes mellitus with diabetic chronic kidney disease: Secondary | ICD-10-CM | POA: Diagnosis present

## 2021-05-20 DIAGNOSIS — M25462 Effusion, left knee: Secondary | ICD-10-CM | POA: Diagnosis present

## 2021-05-20 DIAGNOSIS — Z888 Allergy status to other drugs, medicaments and biological substances status: Secondary | ICD-10-CM

## 2021-05-20 DIAGNOSIS — J9621 Acute and chronic respiratory failure with hypoxia: Secondary | ICD-10-CM | POA: Diagnosis present

## 2021-05-20 DIAGNOSIS — J9611 Chronic respiratory failure with hypoxia: Secondary | ICD-10-CM | POA: Diagnosis present

## 2021-05-20 DIAGNOSIS — M25562 Pain in left knee: Secondary | ICD-10-CM

## 2021-05-20 DIAGNOSIS — G4733 Obstructive sleep apnea (adult) (pediatric): Secondary | ICD-10-CM

## 2021-05-20 DIAGNOSIS — Z79899 Other long term (current) drug therapy: Secondary | ICD-10-CM

## 2021-05-20 DIAGNOSIS — I5033 Acute on chronic diastolic (congestive) heart failure: Secondary | ICD-10-CM | POA: Diagnosis present

## 2021-05-20 DIAGNOSIS — R0682 Tachypnea, not elsewhere classified: Secondary | ICD-10-CM

## 2021-05-20 DIAGNOSIS — R0902 Hypoxemia: Secondary | ICD-10-CM

## 2021-05-20 DIAGNOSIS — I959 Hypotension, unspecified: Secondary | ICD-10-CM | POA: Diagnosis present

## 2021-05-20 DIAGNOSIS — E119 Type 2 diabetes mellitus without complications: Secondary | ICD-10-CM

## 2021-05-20 DIAGNOSIS — N189 Chronic kidney disease, unspecified: Secondary | ICD-10-CM | POA: Diagnosis present

## 2021-05-20 DIAGNOSIS — E860 Dehydration: Secondary | ICD-10-CM | POA: Diagnosis present

## 2021-05-20 DIAGNOSIS — G931 Anoxic brain damage, not elsewhere classified: Secondary | ICD-10-CM | POA: Diagnosis not present

## 2021-05-20 DIAGNOSIS — E611 Iron deficiency: Secondary | ICD-10-CM | POA: Diagnosis present

## 2021-05-20 LAB — CBC WITH DIFFERENTIAL/PLATELET
Abs Immature Granulocytes: 0.18 10*3/uL — ABNORMAL HIGH (ref 0.00–0.07)
Basophils Absolute: 0 10*3/uL (ref 0.0–0.1)
Basophils Relative: 0 %
Eosinophils Absolute: 0 10*3/uL (ref 0.0–0.5)
Eosinophils Relative: 0 %
HCT: 31.2 % — ABNORMAL LOW (ref 36.0–46.0)
Hemoglobin: 9.8 g/dL — ABNORMAL LOW (ref 12.0–15.0)
Immature Granulocytes: 1 %
Lymphocytes Relative: 2 %
Lymphs Abs: 0.4 10*3/uL — ABNORMAL LOW (ref 0.7–4.0)
MCH: 30.8 pg (ref 26.0–34.0)
MCHC: 31.4 g/dL (ref 30.0–36.0)
MCV: 98.1 fL (ref 80.0–100.0)
Monocytes Absolute: 0.9 10*3/uL (ref 0.1–1.0)
Monocytes Relative: 5 %
Neutro Abs: 16.4 10*3/uL — ABNORMAL HIGH (ref 1.7–7.7)
Neutrophils Relative %: 92 %
Platelets: 225 10*3/uL (ref 150–400)
RBC: 3.18 MIL/uL — ABNORMAL LOW (ref 3.87–5.11)
RDW: 15.9 % — ABNORMAL HIGH (ref 11.5–15.5)
Smear Review: NORMAL
WBC: 18 10*3/uL — ABNORMAL HIGH (ref 4.0–10.5)
nRBC: 0 % (ref 0.0–0.2)

## 2021-05-20 LAB — RESP PANEL BY RT-PCR (FLU A&B, COVID) ARPGX2
Influenza A by PCR: NEGATIVE
Influenza B by PCR: NEGATIVE
SARS Coronavirus 2 by RT PCR: NEGATIVE

## 2021-05-20 MED ORDER — SODIUM CHLORIDE 0.9 % IV BOLUS
500.0000 mL | Freq: Once | INTRAVENOUS | Status: AC
  Administered 2021-05-20: 500 mL via INTRAVENOUS

## 2021-05-20 MED ORDER — HYDROCODONE-ACETAMINOPHEN 5-325 MG PO TABS
1.0000 | ORAL_TABLET | Freq: Once | ORAL | Status: AC
Start: 2021-05-20 — End: 2021-05-20
  Administered 2021-05-20: 1 via ORAL
  Filled 2021-05-20: qty 1

## 2021-05-20 MED ORDER — ONDANSETRON 4 MG PO TBDP
4.0000 mg | ORAL_TABLET | Freq: Once | ORAL | Status: AC
  Administered 2021-05-20: 4 mg via ORAL
  Filled 2021-05-20: qty 1

## 2021-05-20 NOTE — ED Provider Notes (Signed)
Emergency Medicine Provider Triage Evaluation Note  Ann Flynn , a 80 y.o. female  was evaluated in triage.  Pt complains of right neck, left knee pain.  Patient presents the emergency department for ongoing symptoms.  She was evaluated 2 days ago for similar complaints, given what appears to be muscle relaxer and lidocaine patch.  Patient states that this is not helping her symptoms and she returns for evaluation.  She denies any headache, visual changes, chest pain, shortness of breath, abdominal pain.  Symptoms are only present with movement or weightbearing.  Review of Systems  Positive: Right neck and left knee pain Negative: Headache, visual changes, chest pain, shortness of breath, abdominal pain  Physical Exam  BP (!) 104/50 (BP Location: Right Arm)   Pulse 76   Temp 98.4 F (36.9 C) (Oral)   Resp 18   Ht _0  (1.651 m)   Wt 106.6 kg   SpO2 97%   BMI 39.11 kg/m  Gen:   Awake, no distress   Resp:  Normal effort  MSK:   Moves extremities without difficulty.  Tender right cervical region with no palpable abnormality.  No midline or left-sided tenderness.  Pulses and sensation intact upper extremities.  Examination of the left knee reveals no deformity.  Mild diffuse tenderness bilateral knees.  No palpable abnormality or ballottement. Other:    Medical Decision Making  Medically screening exam initiated at 8:54 PM.  Appropriate orders placed.  Ann Flynn was informed that the remainder of the evaluation will be completed by another provider, this initial triage assessment does not replace that evaluation, and the importance of remaining in the ED until their evaluation is complete.  Right neck and left knee pain.  Patient was seen 2 days ago for similar complaints with reassuring results.  No improvement on conservative treatment and patient returns.  At this time imaging of the neck and knee will be performed.   Ann Flynn 06/03/2021 2057    Lucrezia Starch, MD 05/19/2021 2259

## 2021-05-20 NOTE — ED Provider Notes (Signed)
[facility]-EMERGENCY DEPARTMENT  ____________________________________________  Time seen: Approximately 9:44 PM  I have reviewed the triage vital signs and the nursing notes.   HISTORY  Chief Complaint Torticollis and Knee Pain   Historian Patient     HPI Ann Flynn is a 80 y.o. female presents to the emergency department with neck pain and left knee pain.  Patient lives independently and daughter reports that she has been confined to her chair and has been unable to stand.  Patient has had to call her daughter who lives several minutes away in order to use the restroom or to get food/drinks from the kitchen.  Daughter is concerned that she cannot take care of herself at home.   Past Medical History:  Diagnosis Date   A-fib ([place])    Anemia    CHF (congestive heart failure) (HCC)    Chronic kidney disease    Diabetes mellitus without complication (HCC)    Hypertension    Other cervical disc degeneration at C4-C5 level    severe _0  5   moderate 2 3 level   Pulmonary hypertension ([place])    Subdural hematoma, acute 03/24/2020   Syncope 07/2020     Immunizations up to date:  Yes.     Past Medical History:  Diagnosis Date   A-fib ([place])    Anemia    CHF (congestive heart failure) (HCC)    Chronic kidney disease    Diabetes mellitus without complication ([place])    Hypertension    Other cervical disc degeneration at C4-C5 level    severe _1  5   moderate 2 3 level   Pulmonary hypertension ([place])    Subdural hematoma, acute 03/24/2020   Syncope 07/2020    Patient Active Problem List   Diagnosis Date Noted   Acute kidney injury superimposed on CKD IV ([place]) 05/23/2021   Generalized weakness 05/31/2021   DDD (degenerative disc disease), lumbar 12/14/2020   A-fib ([place]) 12/06/2020   Atrial flutter with rapid ventricular response ([place])    DDD (degenerative disc disease), cervical 11/27/2020   Syncope and collapse 08/05/2020   Atrial fibrillation, chronic ([place])  03/24/2020   Type 2 diabetes mellitus with stage 4 chronic kidney disease ([place]) 03/24/2020   Syncope 03/24/2020   Pulmonary hypertension ([place]) 03/24/2020   Pulmonary nodules 01/30/2020   Chronic pulmonary embolism, unspecified pulmonary embolism type, unspecified whether acute cor pulmonale present ([place]) 10/06/2019   Chronic respiratory failure with hypoxia ([place]) 10/06/2019   Morbid (severe) obesity due to excess calories ([place]) 10/06/2019   Hypertension associated with diabetes ([place]) 10/06/2019   Hyperlipidemia associated with type 2 diabetes mellitus ([place]) 10/06/2019   CKD (chronic kidney disease), stage IV ([place]) 04/01/2019   Allergic rhinitis 03/28/2019   Arthritis 03/28/2019   CHF (congestive heart failure) ([place]) 03/28/2019   Sleep apnea 03/28/2019   Pulmonary hypertension, primary ([place]) 03/28/2019    Past Surgical History:  Procedure Laterality Date   A-FLUTTER ABLATION N/A 01/18/2021   Procedure: A-FLUTTER ABLATION;  Surgeon: Constance Haw, MD;  Location: [facility];  Service: Cardiovascular;  Laterality: N/A;   ABDOMINAL HYSTERECTOMY  1997   ABDOMINAL HYSTERECTOMY     CARDIOVERSION N/A 07/10/2020   Procedure: CARDIOVERSION;  Surgeon: Jolaine Artist, MD;  Location: [facility];  Service: Cardiovascular;  Laterality: N/A;   CARDIOVERSION N/A 12/07/2020   Procedure: CARDIOVERSION;  Surgeon: Jolaine Artist, MD;  Location: [facility];  Service: Cardiovascular;  Laterality: N/A;   cartiod artery surgery  2010  per patient    CHOLECYSTECTOMY  10/19/2009   masectomy     MASTECTOMY  1994   RIGHT HEART CATH N/A 07/29/2019   Procedure: RIGHT HEART CATH;  Surgeon: Jolaine Artist, MD;  Location: [facility];  Service: Cardiovascular;  Laterality: N/A;   RIGHT HEART CATH N/A 05/11/2020   Procedure: RIGHT HEART CATH;  Surgeon: Jolaine Artist, MD;  Location: [facility];  Service: Cardiovascular;  Laterality: N/A;   RIGHT HEART CATH N/A  08/07/2020   Procedure: RIGHT HEART CATH;  Surgeon: Jolaine Artist, MD;  Location: [facility];  Service: Cardiovascular;  Laterality: N/A;    Prior to Admission medications   Medication Sig Start Date End Date Taking? Authorizing Provider  acetaminophen (TYLENOL) 325 MG tablet Take 650 mg by mouth in the morning and at bedtime.    [provider]  ambrisentan (LETAIRIS) 5 MG tablet Take 1 tablet (5 mg total) by mouth daily. 05/03/21   Flora Lipps, MD  apixaban (ELIQUIS) 5 MG TABS tablet Take 1 tablet (5 mg total) by mouth 2 (two) times daily. 02/05/21 05/06/21  Camnitz, Ocie Doyne, MD  atorvastatin (LIPITOR) 80 MG tablet Take 1 tablet (80 mg total) by mouth daily. 09/24/20   Trinna Post, PA-C  b complex vitamins capsule Take 1 capsule by mouth daily.    [provider]  Biotin 1000 MCG tablet Take 1,000 mcg by mouth in the morning and at bedtime.    [provider]  Cholecalciferol (VITAMIN D3) 50 MCG (2000 UT) TABS Take 2,000 Units by mouth in the morning and at bedtime.    [provider]  diclofenac Sodium (VOLTAREN) 1 % GEL Apply 2 g topically 4 (four) times daily. 05/18/21   Carrie Mew, MD  Dulaglutide (TRULICITY) 3 JK/8.2SU SOPN Inject 3 mg as directed once a week. Patient receives through [employer] Patient Assistance through December 2022 04/15/21   Birdie Sons, MD  ferrous sulfate 325 (65 FE) MG tablet Take 325 mg by mouth daily with breakfast.    [provider]  glucose blood (ONETOUCH ULTRA) test strip Use to check blood sugar once daily as instructed 01/22/21   Birdie Sons, MD  HYDROcodone-acetaminophen (NORCO) 7.5-325 MG tablet Take 1 tablet by mouth every 6 (six) hours as needed for moderate pain. 01/01/21   Birdie Sons, MD  insulin glargine, 1 Unit Dial, (TOUJEO SOLOSTAR) 300 UNIT/ML Solostar Pen Inject 30 Units into the skin daily. 04/23/21   Birdie Sons, MD  Insulin Pen Needle (BD PEN NEEDLE NANO  U/F) 32G X 4 MM MISC Use to inject insulin once daily as directed 12/26/20   Birdie Sons, MD  Lancets [facility] ULTRASOFT) lancets Use to check blood sugars once daily as instructed 01/22/21   Birdie Sons, MD  lidocaine (LIDODERM) 5 % Place 1 patch onto the skin every 12 (twelve) hours. Remove & Discard patch within 12 hours or as directed by MD 05/18/21   Carrie Mew, MD  losartan (COZAAR) 25 MG tablet Take 25 mg by mouth daily. 05/02/21 05/02/22  [provider]  metoprolol succinate (TOPROL-XL) 25 MG 24 hr tablet Take 0.5 tablets (12.5 mg total) by mouth in the morning. 04/23/21   Birdie Sons, MD  omeprazole (PRILOSEC) 40 MG capsule TAKE ONE CAPSULE BY MOUTH EVERY MORNING and TAKE ONE CAPSULE BY MOUTH EVERYDAY AT BEDTIME 03/29/21   Birdie Sons, MD  PRESCRIPTION MEDICATION CPAP: At  bedtime    [provider]  tadalafil, PAH, (ADCIRCA) 20 MG tablet TAKE 2 TABLETS BY MOUTH AT  BEDTIME 04/15/21   Flora Lipps, MD  torsemide (DEMADEX) 20 MG tablet Take 1 tablet (20 mg total) by mouth daily. 01/21/21   Lyda Jester M, PA-C  venlafaxine (EFFEXOR) 75 MG tablet TAKE ONE TABLET BY MOUTH EVERY MORNING 03/29/21   Birdie Sons, MD    Allergies Penicillins, Sulfa antibiotics, and Uptravi [selexipag]  Family History  Problem Relation Age of Onset   CAD Mother    Heart disease Father    CAD Maternal Grandmother     Social History Social History   Tobacco Use   Smoking status: Never   Smokeless tobacco: Never  Vaping Use   Vaping Use: Never used  Substance Use Topics   Alcohol use: Never   Drug use: Never     Review of Systems  Constitutional: No fever/chills Eyes:  No discharge ENT: No upper respiratory complaints. Respiratory: no cough. No SOB/ use of accessory muscles to breath Gastrointestinal:   No nausea, no vomiting.  No diarrhea.  No constipation. Musculoskeletal: Patient has left knee pain and neck pain.  Skin: Negative for rash,  abrasions, lacerations, ecchymosis.    ____________________________________________   PHYSICAL EXAM:  VITAL SIGNS: ED Triage Vitals  Enc Vitals Group     BP 06/02/2021 2039 (!) 104/50     Pulse Rate 05/25/2021 2039 76     Resp 05/27/2021 2039 18     Temp 05/22/2021 2039 98.4 F (36.9 C)     Temp Source 05/30/2021 2039 Oral     SpO2 06/02/2021 2039 97 %     Weight 06/08/2021 2044 235 lb (106.6 kg)     Height 05/22/2021 2044 _0  (1.651 m)     Head Circumference --      Peak Flow --      Pain Score 06/11/2021 2044 10     Pain Loc --      Pain Edu? --      Excl. in [place]? --      Constitutional: Alert and oriented. Well appearing and in no acute distress. Eyes: Conjunctivae are normal. PERRL. EOMI. Head: Atraumatic. ENT:      Nose: No congestion/rhinnorhea.      Mouth/Throat: Mucous membranes are moist.  Neck: No stridor.  Patient performs limited range of motion at the neck. Cardiovascular: Normal rate, regular rhythm. Normal S1 and S2.  Good peripheral circulation. Respiratory: Normal respiratory effort without tachypnea or retractions. Lungs CTAB. Good air entry to the bases with no decreased or absent breath sounds Gastrointestinal: Bowel sounds x 4 quadrants. Soft and nontender to palpation. No guarding or rigidity. No distention. Musculoskeletal: Patient has symmetric strength in the upper and lower extremities.  Patient performs limited range of motion at the left knee. No obvious deformities noted.  Palpable dorsalis pedis pulse bilaterally and symmetrically.  Capillary refill less than 2 seconds on the left. Neurologic:  Normal for age. No gross focal neurologic deficits are appreciated.  Skin:  Skin is warm, dry and intact. No rash noted. Psychiatric: Mood and affect are normal for age. Speech and behavior are normal.   ____________________________________________   LABS (all labs ordered are listed, but only abnormal results are displayed)  Labs Reviewed  CBC WITH  DIFFERENTIAL/PLATELET - Abnormal; Notable for the following components:      Result Value   WBC 18.0 (*)    RBC 3.18 (*)  Hemoglobin 9.8 (*)    HCT 31.2 (*)    RDW 15.9 (*)    Neutro Abs 16.4 (*)    Lymphs Abs 0.4 (*)    Abs Immature Granulocytes 0.18 (*)    All other components within normal limits  RESP PANEL BY RT-PCR (FLU A&B, COVID) ARPGX2  COMPREHENSIVE METABOLIC PANEL  CK  URINALYSIS, COMPLETE (UACMP) WITH MICROSCOPIC   ____________________________________________  EKG   ____________________________________________  RADIOLOGY Unk Pinto, personally viewed and evaluated these images (plain radiographs) as part of my medical decision making, as well as reviewing the written report by the radiologist.  DG Cervical Spine Complete  Result Date: 06/04/2021 CLINICAL DATA:  Persistent pain EXAM: CERVICAL SPINE - COMPLETE 4+ VIEW COMPARISON:  CT 03/23/2020 FINDINGS: Inadequate visualization C7 and cervicothoracic junction. Trace anterolisthesis C3 on C4, C4 on C5 and C5 on C6. Vertebral body heights are maintained. Mild disc space narrowing C5-C6. Dens and lateral masses are within normal limits. IMPRESSION: Trace anterolisthesis C3 through C6 with mild degenerative change at C5-C6. C7 and cervicothoracic junction are poorly visible Electronically Signed   By: Donavan Foil M.D.   On: 06/03/2021 21:18   DG Knee Complete 4 Views Left  Result Date: 06/16/2021 CLINICAL DATA:  Left knee pain. EXAM: LEFT KNEE - COMPLETE 4+ VIEW COMPARISON:  None. FINDINGS: No evidence of acute fracture or dislocation. Moderate severity medial tibiofemoral compartment space narrowing is seen. A moderate sized joint effusion is noted. IMPRESSION: 1. Moderate sized joint effusion without evidence of acute fracture. Electronically Signed   By: Virgina Norfolk M.D.   On: 06/10/2021 21:20   DG Hip Unilat W or Wo Pelvis 2-3 Views Left  Result Date: 05/31/2021 CLINICAL DATA:  Left hip pain EXAM: DG  HIP (WITH OR WITHOUT PELVIS) 2-3V LEFT COMPARISON:  None. FINDINGS: SI joints are non widened. Pubic symphysis and rami appear intact. Mild degenerative changes of the right hip. No fracture or malalignment on the left. IMPRESSION: No acute osseous abnormality Electronically Signed   By: Donavan Foil M.D.   On: 06/12/2021 22:16    ____________________________________________    PROCEDURES  Procedure(s) performed:     Procedures     Medications  HYDROcodone-acetaminophen (NORCO/VICODIN) 5-325 MG per tablet 1 tablet (1 tablet Oral Given 06/05/2021 2159)  ondansetron (ZOFRAN-ODT) disintegrating tablet 4 mg (4 mg Oral Given 05/18/2021 2157)  sodium chloride 0.9 % bolus 500 mL (500 mLs Intravenous New Bag/Given 06/13/2021 2314)     ____________________________________________   INITIAL IMPRESSION / ASSESSMENT AND PLAN / ED COURSE  Pertinent labs & imaging results that were available during my care of the patient were reviewed by me and considered in my medical decision making (see chart for details).      Assessment and plan:  Knee pain: Hip pain  AKI Weakness 80 year old female presents to the emergency department with weakness at home and an inability to bear weight.  Patient has been complaining of pain in both her neck and her left knee and patient's daughter reports that she has been mostly confined to her couch.  Patient was mildly hypotensive at triage but vital signs were otherwise reassuring.  She had an elevated white blood cell count compared to labs obtained 2 days ago, WBC 18.  Lab called and was concerned about creatinine at 2.95 which is significantly elevated from creatinine of 1.81 obtained 2 days ago.  Nursing staff attempted to cath patient for urinalysis and they were unable to obtain urine.  Will  give 500 cc of normal saline and will reassess.  Patient's daughter reports that she is unable to care for patient at home and would like social work consult.  Patient  was admitted to the hospitalist service under the care of Dr. Damita Dunnings.     ____________________________________________  FINAL CLINICAL IMPRESSION(S) / ED DIAGNOSES  Final diagnoses:  AKI (acute kidney injury) ([place])  Weakness  Acute pain of left knee      NEW MEDICATIONS STARTED DURING THIS VISIT:  ED Discharge Orders     None           This chart was dictated using voice recognition software/Dragon. Despite best efforts to proofread, errors can occur which can change the meaning. Any change was purely unintentional.     Lannie Fields, PA-C 06/11/2021 2357    Naaman Plummer, MD 05/21/21 2147

## 2021-05-20 NOTE — ED Triage Notes (Signed)
Pt in by [facility] from home with co right sided neck and left knee pain since Saturday. Pt denies any injury at this time. Pt states worse to move knee and painful to walk. States neck pain also worse on movement.

## 2021-05-20 NOTE — Telephone Encounter (Signed)
I returned pt's call.   She had called in earlier c/o continued right neck pain and left knee pain.   She was seen in the ED at [facility] on 05/18/2021 for same issues.   She is not getting relief from the pain medication, diclofenac 2 g topical cream and Lidocaine 5% patch according to her chart.   She was cleaning her garage Sat. When these pains started.  Denies injuries or falls.   She is having difficulty walking on left knee.   Her daughter is there with her today.  She can't come into the office because "It takes 2 people to assist me and only my daughter is with me today"    "I was wondering if the dr could just call in a stronger pain medication than what they gave me in the emergency room".    I have sent her request to [facility].    I was made aware earlier today that Dr. Caryn Section is not in today.   I let her know someone would call her back that another provider would need to review her request.   She was agreeable to this plan.  I encouraged her to return to the ED or go to the urgent care if she became worse.

## 2021-05-20 NOTE — Telephone Encounter (Signed)
Reason for Disposition  [1] MODERATE neck pain (e.g., interferes with normal activities) AND [2] present > 3 days  Answer Assessment - Initial Assessment Questions 1. ONSET: "When did the pain begin?"      Pt was seen in the ED on 05/18/2021 for right neck pain.   I'm so worn out and weak.   I just feel awful.   I'm just feeling worse.   The dr gave me pain patches and Tylenol and a pain cream but none of it seems to help much.   They did an x ray of my shoulder on the right.   It's a muscle pull, I think.    I was cleaning my garage Sat and I think I injured myself. 2. LOCATION: "Where does it hurt?"      The pain comes down my neck under my ear across to my shoulder.   My left knee is throbbing.   It started on Sat. About noon.   No falls.  I think it happened while cleaning my garage.   My knee is not swollen just hurts.  3. PATTERN "Does the pain come and go, or has it been constant since it started?"      The neck pain is constant.   The medication from the ED does not help much.    They looked at my knee but no x rays done.  It's twisted is what the dr said.   Just the same medications as for the neck. 4. SEVERITY: "How bad is the pain?"  (Scale 1-10; or mild, moderate, severe)   - NO PAIN (0): no pain or only slight stiffness    - MILD (1-3): doesn't interfere with normal activities    - MODERATE (4-7): interferes with normal activities or awakens from sleep    - SEVERE (8-10):  excruciating pain, unable to do any normal activities      My neck is a 9/10.      Right knee is a 9/10 5. RADIATION: "Does the pain go anywhere else, shoot into your arms?"     The neck pain radiates into my right shoulder.   6. CORD SYMPTOMS: "Any weakness or numbness of the arms or legs?"     I can walk a couple of steps only.   I have to have help to get to the bathroom but I'm using a walker.   I need help to get on the toilet.   My daughter took off work and is with me.    7. CAUSE: "What do you think is  causing the neck pain?"     Muscle spasms.   No prior injuries 8. NECK OVERUSE: "Any recent activities that involved turning or twisting the neck?"     Cleaning my garage on Sat.   I messed up my neck and knee.   I sit down for lunch and I started hurting. 9. OTHER SYMPTOMS: "Do you have any other symptoms?" (e.g., headache, fever, chest pain, difficulty breathing, neck swelling)     I have a headache from the neck pain.    10. PREGNANCY: "Is there any chance you are pregnant?" "When was your last menstrual period?"       N/A due to age  Protocols used: Neck Pain or Stiffness-A-AH

## 2021-05-20 NOTE — ED Notes (Signed)
Protocols ordered pain [place]

## 2021-05-21 ENCOUNTER — Encounter: Payer: Self-pay | Admitting: Internal Medicine

## 2021-05-21 ENCOUNTER — Inpatient Hospital Stay: Payer: Medicare Other

## 2021-05-21 DIAGNOSIS — Z515 Encounter for palliative care: Secondary | ICD-10-CM | POA: Diagnosis not present

## 2021-05-21 DIAGNOSIS — N189 Chronic kidney disease, unspecified: Secondary | ICD-10-CM | POA: Diagnosis not present

## 2021-05-21 DIAGNOSIS — Z20822 Contact with and (suspected) exposure to covid-19: Secondary | ICD-10-CM | POA: Diagnosis present

## 2021-05-21 DIAGNOSIS — I272 Pulmonary hypertension, unspecified: Secondary | ICD-10-CM | POA: Diagnosis not present

## 2021-05-21 DIAGNOSIS — N281 Cyst of kidney, acquired: Secondary | ICD-10-CM | POA: Diagnosis not present

## 2021-05-21 DIAGNOSIS — R0682 Tachypnea, not elsewhere classified: Secondary | ICD-10-CM | POA: Diagnosis not present

## 2021-05-21 DIAGNOSIS — R531 Weakness: Secondary | ICD-10-CM | POA: Diagnosis not present

## 2021-05-21 DIAGNOSIS — I129 Hypertensive chronic kidney disease with stage 1 through stage 4 chronic kidney disease, or unspecified chronic kidney disease: Secondary | ICD-10-CM | POA: Diagnosis not present

## 2021-05-21 DIAGNOSIS — G931 Anoxic brain damage, not elsewhere classified: Secondary | ICD-10-CM | POA: Diagnosis not present

## 2021-05-21 DIAGNOSIS — E611 Iron deficiency: Secondary | ICD-10-CM | POA: Diagnosis present

## 2021-05-21 DIAGNOSIS — D631 Anemia in chronic kidney disease: Secondary | ICD-10-CM | POA: Diagnosis not present

## 2021-05-21 DIAGNOSIS — J811 Chronic pulmonary edema: Secondary | ICD-10-CM | POA: Diagnosis not present

## 2021-05-21 DIAGNOSIS — I480 Paroxysmal atrial fibrillation: Secondary | ICD-10-CM | POA: Diagnosis present

## 2021-05-21 DIAGNOSIS — E875 Hyperkalemia: Secondary | ICD-10-CM | POA: Diagnosis present

## 2021-05-21 DIAGNOSIS — E871 Hypo-osmolality and hyponatremia: Secondary | ICD-10-CM | POA: Diagnosis not present

## 2021-05-21 DIAGNOSIS — N179 Acute kidney failure, unspecified: Principal | ICD-10-CM

## 2021-05-21 DIAGNOSIS — F32A Depression, unspecified: Secondary | ICD-10-CM | POA: Diagnosis present

## 2021-05-21 DIAGNOSIS — E1165 Type 2 diabetes mellitus with hyperglycemia: Secondary | ICD-10-CM | POA: Diagnosis not present

## 2021-05-21 DIAGNOSIS — N139 Obstructive and reflux uropathy, unspecified: Secondary | ICD-10-CM | POA: Diagnosis present

## 2021-05-21 DIAGNOSIS — R809 Proteinuria, unspecified: Secondary | ICD-10-CM | POA: Diagnosis not present

## 2021-05-21 DIAGNOSIS — M25462 Effusion, left knee: Secondary | ICD-10-CM | POA: Diagnosis present

## 2021-05-21 DIAGNOSIS — I517 Cardiomegaly: Secondary | ICD-10-CM | POA: Diagnosis not present

## 2021-05-21 DIAGNOSIS — J9621 Acute and chronic respiratory failure with hypoxia: Secondary | ICD-10-CM | POA: Diagnosis present

## 2021-05-21 DIAGNOSIS — I509 Heart failure, unspecified: Secondary | ICD-10-CM | POA: Diagnosis not present

## 2021-05-21 DIAGNOSIS — E1122 Type 2 diabetes mellitus with diabetic chronic kidney disease: Secondary | ICD-10-CM | POA: Diagnosis present

## 2021-05-21 DIAGNOSIS — I482 Chronic atrial fibrillation, unspecified: Secondary | ICD-10-CM | POA: Diagnosis present

## 2021-05-21 DIAGNOSIS — N184 Chronic kidney disease, stage 4 (severe): Secondary | ICD-10-CM | POA: Diagnosis not present

## 2021-05-21 DIAGNOSIS — I4892 Unspecified atrial flutter: Secondary | ICD-10-CM | POA: Diagnosis present

## 2021-05-21 DIAGNOSIS — I959 Hypotension, unspecified: Secondary | ICD-10-CM | POA: Diagnosis present

## 2021-05-21 DIAGNOSIS — I700 Atherosclerosis of aorta: Secondary | ICD-10-CM | POA: Diagnosis not present

## 2021-05-21 DIAGNOSIS — J9611 Chronic respiratory failure with hypoxia: Secondary | ICD-10-CM | POA: Diagnosis not present

## 2021-05-21 DIAGNOSIS — E1121 Type 2 diabetes mellitus with diabetic nephropathy: Secondary | ICD-10-CM | POA: Diagnosis present

## 2021-05-21 DIAGNOSIS — I5031 Acute diastolic (congestive) heart failure: Secondary | ICD-10-CM | POA: Diagnosis not present

## 2021-05-21 DIAGNOSIS — I110 Hypertensive heart disease with heart failure: Secondary | ICD-10-CM | POA: Diagnosis present

## 2021-05-21 DIAGNOSIS — I2782 Chronic pulmonary embolism: Secondary | ICD-10-CM | POA: Diagnosis present

## 2021-05-21 DIAGNOSIS — E860 Dehydration: Secondary | ICD-10-CM | POA: Diagnosis present

## 2021-05-21 DIAGNOSIS — N289 Disorder of kidney and ureter, unspecified: Secondary | ICD-10-CM

## 2021-05-21 DIAGNOSIS — I5033 Acute on chronic diastolic (congestive) heart failure: Secondary | ICD-10-CM | POA: Diagnosis present

## 2021-05-21 LAB — COMPREHENSIVE METABOLIC PANEL
ALT: 13 U/L (ref 0–44)
AST: 20 U/L (ref 15–41)
Albumin: 3 g/dL — ABNORMAL LOW (ref 3.5–5.0)
Alkaline Phosphatase: 75 U/L (ref 38–126)
Anion gap: 9 (ref 5–15)
BUN: 73 mg/dL — ABNORMAL HIGH (ref 8–23)
CO2: 26 mmol/L (ref 22–32)
Calcium: 8.7 mg/dL — ABNORMAL LOW (ref 8.9–10.3)
Chloride: 98 mmol/L (ref 98–111)
Creatinine, Ser: 2.95 mg/dL — ABNORMAL HIGH (ref 0.44–1.00)
GFR, Estimated: 16 mL/min — ABNORMAL LOW (ref >60)
Glucose, Bld: 261 mg/dL — ABNORMAL HIGH (ref 70–99)
Potassium: 4.8 mmol/L (ref 3.5–5.1)
Sodium: 133 mmol/L — ABNORMAL LOW (ref 135–145)
Total Bilirubin: 1.3 mg/dL — ABNORMAL HIGH (ref 0.3–1.2)
Total Protein: 6 g/dL — ABNORMAL LOW (ref 6.5–8.1)

## 2021-05-21 LAB — GLUCOSE, CAPILLARY
Glucose-Capillary: 247 mg/dL — ABNORMAL HIGH (ref 70–99)
Glucose-Capillary: 209 mg/dL — ABNORMAL HIGH (ref 70–99)
Glucose-Capillary: 178 mg/dL — ABNORMAL HIGH (ref 70–99)

## 2021-05-21 LAB — BASIC METABOLIC PANEL
Anion gap: 7 (ref 5–15)
BUN: 73 mg/dL — ABNORMAL HIGH (ref 8–23)
CO2: 25 mmol/L (ref 22–32)
Calcium: 7.9 mg/dL — ABNORMAL LOW (ref 8.9–10.3)
Chloride: 102 mmol/L (ref 98–111)
Creatinine, Ser: 2.75 mg/dL — ABNORMAL HIGH (ref 0.44–1.00)
GFR, Estimated: 17 mL/min — ABNORMAL LOW (ref >60)
Glucose, Bld: 252 mg/dL — ABNORMAL HIGH (ref 70–99)
Potassium: 4.5 mmol/L (ref 3.5–5.1)
Sodium: 134 mmol/L — ABNORMAL LOW (ref 135–145)

## 2021-05-21 LAB — HEMOGLOBIN A1C
Hgb A1c MFr Bld: 7.9 % — ABNORMAL HIGH (ref 4.8–5.6)
Mean Plasma Glucose: 180 mg/dL

## 2021-05-21 LAB — CK: Total CK: 66 U/L (ref 38–234)

## 2021-05-21 LAB — BRAIN NATRIURETIC PEPTIDE: B Natriuretic Peptide: 285.9 pg/mL — ABNORMAL HIGH (ref 0.0–100.0)

## 2021-05-21 MED ORDER — MORPHINE SULFATE (PF) 2 MG/ML IV SOLN
2.0000 mg | INTRAVENOUS | Status: DC | PRN
  Administered 2021-05-21 – 2021-05-25 (×10): 2 mg via INTRAVENOUS
  Filled 2021-05-21 (×11): qty 1

## 2021-05-21 MED ORDER — ACETAMINOPHEN 325 MG RE SUPP
650.0000 mg | Freq: Four times a day (QID) | RECTAL | Status: DC | PRN
  Filled 2021-05-21: qty 2

## 2021-05-21 MED ORDER — ONDANSETRON HCL 4 MG PO TABS: 4.0000 mg | ORAL_TABLET | Freq: Four times a day (QID) | ORAL | Status: DC | PRN

## 2021-05-21 MED ORDER — APIXABAN 5 MG PO TABS
5.0000 mg | ORAL_TABLET | Freq: Two times a day (BID) | ORAL | Status: DC
  Administered 2021-05-21 – 2021-05-22 (×4): 5 mg via ORAL
  Filled 2021-05-21 (×4): qty 1

## 2021-05-21 MED ORDER — INSULIN ASPART 100 UNIT/ML IJ SOLN
0.0000 [IU] | Freq: Three times a day (TID) | INTRAMUSCULAR | Status: DC
  Administered 2021-05-21 (×2): 5 [IU] via SUBCUTANEOUS
  Administered 2021-05-22: 8 [IU] via SUBCUTANEOUS
  Administered 2021-05-22: 11 [IU] via SUBCUTANEOUS
  Administered 2021-05-23: 8 [IU] via SUBCUTANEOUS
  Administered 2021-05-23 (×2): 11 [IU] via SUBCUTANEOUS
  Administered 2021-05-24: 3 [IU] via SUBCUTANEOUS
  Administered 2021-05-24: 8 [IU] via SUBCUTANEOUS
  Administered 2021-05-24: 11 [IU] via SUBCUTANEOUS
  Administered 2021-05-25: 2 [IU] via SUBCUTANEOUS
  Administered 2021-05-26: 5 [IU] via SUBCUTANEOUS
  Administered 2021-05-26: 3 [IU] via SUBCUTANEOUS
  Filled 2021-05-21 (×12): qty 1

## 2021-05-21 MED ORDER — ONDANSETRON HCL 4 MG/2ML IJ SOLN: 4.0000 mg | Freq: Four times a day (QID) | INTRAMUSCULAR | Status: DC | PRN

## 2021-05-21 MED ORDER — ACETAMINOPHEN 325 MG PO TABS
650.0000 mg | ORAL_TABLET | Freq: Four times a day (QID) | ORAL | Status: DC | PRN
  Administered 2021-05-21 – 2021-05-25 (×4): 650 mg via ORAL
  Filled 2021-05-21 (×4): qty 2

## 2021-05-21 MED ORDER — INSULIN ASPART 100 UNIT/ML IJ SOLN
0.0000 [IU] | Freq: Every day | INTRAMUSCULAR | Status: DC
  Administered 2021-05-21: 3 [IU] via SUBCUTANEOUS
  Administered 2021-05-22: 4 [IU] via SUBCUTANEOUS
  Administered 2021-05-23: 3 [IU] via SUBCUTANEOUS
  Filled 2021-05-21 (×4): qty 1

## 2021-05-21 MED ORDER — INFLUENZA VAC A&B SA ADJ QUAD 0.5 ML IM PRSY
0.5000 mL | PREFILLED_SYRINGE | INTRAMUSCULAR | Status: DC
  Filled 2021-05-21: qty 0.5

## 2021-05-21 MED ORDER — SODIUM CHLORIDE 0.9 % IV SOLN: INTRAVENOUS | Status: DC

## 2021-05-21 MED ORDER — ACETAMINOPHEN 325 MG PO TABS: 650.0000 mg | ORAL_TABLET | Freq: Four times a day (QID) | ORAL | Status: DC | PRN

## 2021-05-21 MED ORDER — ACETAMINOPHEN 650 MG RE SUPP
650.0000 mg | Freq: Four times a day (QID) | RECTAL | Status: DC | PRN
  Filled 2021-05-21: qty 2

## 2021-05-21 MED ORDER — ORAL CARE MOUTH RINSE
15.0000 mL | Freq: Two times a day (BID) | OROMUCOSAL | Status: DC
  Administered 2021-05-21 – 2021-05-27 (×8): 15 mL via OROMUCOSAL

## 2021-05-21 NOTE — Progress Notes (Signed)
  Chaplain On-Call responded to Order Requisition for Advance Directives information.  Chaplain provided the documents and education to the patient.  The patient stated that currently she is feeling pain in her knee and shoulder, and she will contact Chaplains if she chooses to proceed with the AD documents.  Chaplain provided spiritual and emotional support.  Chaplain Pollyann Samples M.Div., [facility]

## 2021-05-21 NOTE — Assessment & Plan Note (Addendum)
-  Rest - Analgesics - PT eval

## 2021-05-21 NOTE — NC FL2 (Signed)
[place] LEVEL OF CARE SCREENING TOOL     IDENTIFICATION  Patient Name: Ann Flynn Birthdate: July 31, 1941 Sex: female Admission Date (Current Location): 06/01/2021  [facility] [place] Number:  Engineering geologist and Address:  [facility], [address], [place], [place] [address]      Provider Number: [id number]  Attending Physician Name and Address:  Edwin Dada, *  Relative Name and Phone Number:       Current Level of Care: Hospital Recommended Level of Care: [place] Prior Approval Number:    Date Approved/Denied:   PASRR Number: [id number]  Discharge Plan: SNF    Current Diagnoses: Patient Active Problem List   Diagnosis Date Noted   Effusion of left knee 05/21/2021   Hyponatremia 05/21/2021   Anemia in chronic kidney disease (CKD) 05/21/2021   Acute renal insufficiency 05/21/2021   Acute kidney injury superimposed on CKD IV ([place]) 06/11/2021   Neck pain 06/10/2021   DDD (degenerative disc disease), lumbar 12/14/2020   DDD (degenerative disc disease), cervical 11/27/2020   Syncope and collapse 08/05/2020   Atrial fibrillation, chronic ([place]) 03/24/2020   Diabetes mellitus, type II ([place]) 03/24/2020   Syncope 03/24/2020   Pulmonary hypertension ([place]) 03/24/2020   Pulmonary nodules 01/30/2020   Chronic pulmonary embolism, unspecified pulmonary embolism type, unspecified whether acute cor pulmonale present ([place]) 10/06/2019   Chronic respiratory failure with hypoxia ([place]) 10/06/2019   Morbid (severe) obesity due to excess calories ([place]) 10/06/2019   Hypertension associated with diabetes ([place]) 10/06/2019   Hyperlipidemia associated with type 2 diabetes mellitus ([place]) 10/06/2019   CKD (chronic kidney disease), stage IV ([place]) 04/01/2019   Allergic rhinitis 03/28/2019   Arthritis 03/28/2019   CHF (congestive heart failure) ([place]) 03/28/2019   OSA on CPAP 03/28/2019    Orientation RESPIRATION  BLADDER Height & Weight     Self, Time, Situation, Place  O2 (Nasal Canula 4 L. On CPAP at home.) Continent Weight: 244 lb 14.9 oz (111.1 kg) Height:  _0  (165.1 cm)  BEHAVIORAL SYMPTOMS/MOOD NEUROLOGICAL BOWEL NUTRITION STATUS   (None)  (None) Continent Diet (Heart healthy/carb modified)  AMBULATORY STATUS COMMUNICATION OF NEEDS Skin   Extensive Assist Verbally Normal                       Personal Care Assistance Level of Assistance  Bathing, Feeding, Dressing Bathing Assistance: Maximum assistance Feeding assistance: Limited assistance Dressing Assistance: Maximum assistance     Functional Limitations Info  Sight, Hearing, Speech Sight Info: Adequate Hearing Info: Adequate Speech Info: Adequate    SPECIAL CARE FACTORS FREQUENCY  PT (By licensed PT), OT (By licensed OT)     PT Frequency: 5 x week OT Frequency: 5 x week            Contractures Contractures Info: Not present    Additional Factors Info  Code Status, Allergies Code Status Info: Full code Allergies Info: Penicillins, Sulfa Antibiotics, Uptravi (Selexipag)           Current Medications (05/21/2021):  This is the current hospital active medication list Current Facility-Administered Medications  Medication Dose Route Frequency Provider Last Rate Last Admin   0.9 %  sodium chloride infusion   Intravenous Continuous Athena Masse, MD   Stopped at 05/21/21 1325   acetaminophen (TYLENOL) suppository 650 mg  650 mg Rectal Q6H PRN Oswald Hillock, RPH       Or   acetaminophen (TYLENOL) tablet  650 mg  650 mg Oral Q6H PRN Oswald Hillock, RPH       apixaban (ELIQUIS) tablet 5 mg  5 mg Oral BID Judd Gaudier V, MD   5 mg at 05/21/21 1029   [START ON 05/22/2021] influenza vaccine adjuvanted (FLUAD) injection 0.5 mL  0.5 mL Intramuscular Tomorrow-1000 [place], Suann Larry, MD       insulin aspart (novoLOG) injection 0-15 Units  0-15 Units Subcutaneous TID WC Judd Gaudier V, MD   5 Units at 05/21/21  1316   insulin aspart (novoLOG) injection 0-5 Units  0-5 Units Subcutaneous QHS Athena Masse, MD   3 Units at 05/21/21 0439   MEDLINE mouth rinse  15 mL Mouth Rinse BID Edwin Dada, MD   15 mL at 05/21/21 1030   morphine 2 MG/ML injection 2 mg  2 mg Intravenous Q2H PRN Athena Masse, MD   2 mg at 05/21/21 1424   ondansetron (ZOFRAN) tablet 4 mg  4 mg Oral Q6H PRN Athena Masse, MD       Or   ondansetron [facility]) injection 4 mg  4 mg Intravenous Q6H PRN Athena Masse, MD         Discharge Medications: Please see discharge summary for a list of discharge medications.  Relevant Imaging Results:  Relevant Lab Results:   Additional Information SS#: [id number]. COVID Vaccines: Pfizer 10/17/19, 11/07/19, 05/17/20. No second booster.  Candie Chroman, LCSW

## 2021-05-21 NOTE — Progress Notes (Signed)
  Progress Note    Ann Flynn   [id number]:[id number]  DOB: 12-Jan-1941  DOA: 06/17/2021     0 Date of Service: 05/21/2021     Brief summary: [name] is an 80 y.o. F with CKD IV baseline 1.8, pHTN, dCHF, DM, OSA on CPAP, Afib/flutter s/p DCCV on Eliquis, Amio and hx SDH remote who presented with knee pain/swelling and neck stiffness.   Recently seen in ER, given Voltaren, lidocaine patch, but unable to move or feed self at home.    In the ER, BP 104/50, WBC 18K, Cr 2.95 mg/dL from baseline 1.8.  X-rays of neck and knee still unremarkable.   10/4 admitted and started on fluids for AKI    During the day today, patient appeared increasingly dyspneic and rhonchorous.  CXR without edema, but BNP elevated.  IV fluids stopped.   Repeat BMP tomorrow, if still rhonchorous and Cr no improvement, AKI may be congestive and will start lasix.  If no clinical edema tomorrow, and Cr improving, will resume IV fluids for AKI   This is a no charge note, for further details, please see documentation from Dr. Damita Dunnings from earlier today.      Hospital Problems * Acute kidney injury superimposed on CKD IV (HCC)     Effusion of left knee - Rest - Analgesics - PT eval   Neck pain Imaging negative.   Diabetes mellitus, type II ([place])    Atrial fibrillation, chronic ([place])             Time spent: 15 minutes [facility] 05/21/2021, 9:04 PM

## 2021-05-21 NOTE — Evaluation (Signed)
Occupational Therapy Evaluation Patient Details Name: Ann Flynn MRN: [id number] DOB: Jul 02, 1941 Today's Date: 05/21/2021   History of Present Illness 80 year old female with history of diastolic CHF, moderate pulmonary hypertension, IDDM 2, CKD stage IV, OSA on CPAP, a flutter s/p cardioversion on 4/22 on Eliquis.  She presented to the ED for the second time in 3 days with complaints of neck pain and knee pain with difficulty bearing weight as well as generalized weakness   Clinical Impression   Pt was seen for OT evaluation this date. Prior to hospital admission, pt was independent, cleaning out her garage and unable to recall a specific time/incident that may have caused the significant pain she is experiencing. Pt lives alone and her daughter has had to come to assist the pt with ADL and ADL transfers recently, as the pt was too weak to stand. Currently pt demonstrates impairments in significant pain, strength, balance, and activity tolerance as described below (See OT problem list) which functionally limit her ability to perform ADL/self-care tasks. Pt currently requires MOD-MAX A for LB ADL while seated EOB. Significantly pain limited (R shoulder/neck, L hip, L ankle).  Pt would benefit from skilled OT services to address noted impairments and functional limitations (see below for any additional details) in order to maximize safety and independence while minimizing falls risk and caregiver burden. Upon hospital discharge, recommend STR to maximize pt safety and return to PLOF.     Recommendations for follow up therapy are one component of a multi-disciplinary discharge planning process, led by the attending physician.  Recommendations may be updated based on patient status, additional functional criteria and insurance authorization.   Follow Up Recommendations  SNF    Equipment Recommendations       Recommendations for Other Services       Precautions / Restrictions  Precautions Precautions: None Restrictions Weight Bearing Restrictions: No      Mobility Bed Mobility Overal bed mobility: Needs Assistance Bed Mobility: Sit to Supine;Supine to Sit     Supine to sit: Mod assist Sit to supine: Mod assist   General bed mobility comments: pain limited, unable to tolerate    Transfers                 General transfer comment: deferred 2/2 pain    Balance Overall balance assessment: Needs assistance Sitting-balance support: Single extremity supported;Feet supported;Bilateral upper extremity supported Sitting balance-Leahy Scale: Fair Sitting balance - Comments: Pt clearly uncomfortable (pain) in sitting, able to maintain for 2-3 minutes with occasional direct assist but quick to fatigue, have increased pain and anxiety about being able to even maintain sitting much less attempt standing.                                   ADL either performed or assessed with clinical judgement   ADL Overall ADL's : Needs assistance/impaired                                       General ADL Comments: Pt currently requires MOD-MAX A for LB ADL tasks from bed level position 2/2 significant pain, weakness, and impaired balance. Unable to attempt ADL transfer 2/2 pain     Vision Baseline Vision/History: 1 Wears glasses Patient Visual Report: No change from baseline       Perception  Praxis      Pertinent Vitals/Pain Pain Assessment: 0-10 Pain Score: 10-Worst pain ever Pain Location: R shoulder/neck, L hip, L ankle Pain Descriptors / Indicators: Stabbing;Sharp;Shooting;Grimacing;Guarding;Tender Pain Intervention(s): Limited activity within patient's tolerance;Monitored during session;Repositioned;Patient requesting pain meds-RN notified     Hand Dominance     Extremity/Trunk Assessment Upper Extremity Assessment Upper Extremity Assessment: Generalized weakness (R shoulder pain with movement but able to achieve  grossly full shoulder flexion ROM R>L)   Lower Extremity Assessment Lower Extremity Assessment: Generalized weakness (Pt reports L ankle stiffness and significant pain with AAROM attempts)       Communication Communication Communication: No difficulties   Cognition Arousal/Alertness: Awake/alert Behavior During Therapy: Restless;Anxious Overall Cognitive Status: Within Functional Limits for tasks assessed                                     General Comments  Pt with L knee, neck, headache and general body pain that is functionally limiting.  L knee warm to touch but similar to R    Exercises     Shoulder Instructions      Home Living Family/patient expects to be discharged to:: Skilled nursing facility Living Arrangements: Alone                               Additional Comments: daughter lives close      Prior Functioning/Environment Level of Independence: Independent with assistive device(s)        Comments: apparently up until ~2 weeks ago pt was managing relatively well with daily calls but only infrequent in-person assist from daughter        OT Problem List: Decreased strength;Decreased range of motion;Pain;Decreased activity tolerance;Impaired balance (sitting and/or standing);Decreased knowledge of use of DME or AE;Impaired UE functional use      OT Treatment/Interventions: Self-care/ADL training;Therapeutic exercise;Therapeutic activities;DME and/or AE instruction;Patient/family education;Balance training    OT Goals(Current goals can be found in the care plan section) Acute Rehab OT Goals Patient Stated Goal: Have less pain and get back to walking and being independent OT Goal Formulation: With patient Time For Goal Achievement: 06/04/21 Potential to Achieve Goals: Good ADL Goals Pt Will Perform Lower Body Dressing: with mod assist;sit to/from stand Pt Will Transfer to Toilet: with mod assist;stand pivot transfer;bedside  commode Additional ADL Goal #1: Pt will tolerate 72mn of seated ADL tasks while maintaining good static sitting balance with no LOB.  OT Frequency: Min 1X/week   Barriers to D/C:            Co-evaluation              AM-PAC OT "6 Clicks" Daily Activity     Outcome Measure Help from another person eating meals?: None Help from another person taking care of personal grooming?: A Little Help from another person toileting, which includes using toliet, bedpan, or urinal?: A Lot Help from another person bathing (including washing, rinsing, drying)?: A Lot Help from another person to put on and taking off regular upper body clothing?: A Lot Help from another person to put on and taking off regular lower body clothing?: A Lot 6 Click Score: 15   End of Session Nurse Communication: Patient requests pain meds  Activity Tolerance: Patient limited by pain Patient left: in bed;with call bell/phone within reach;with bed alarm set  OT Visit Diagnosis: Other abnormalities of  gait and mobility (R26.89);Muscle weakness (generalized) (M62.81);Pain Pain - Right/Left: Right (R shoulder, L hip, L ankle) Pain - part of body: Shoulder                Time: 1130-1147 OT Time Calculation (min): 17 min Charges:  OT General Charges $OT Visit: 1 Visit OT Evaluation $OT Eval Moderate Complexity: 1 Mod  Ardeth Perfect., MPH, MS, OTR/L ascom [phone] 05/21/21, 2:11 PM

## 2021-05-21 NOTE — Hospital Course (Addendum)
Mrs. Acklin is an 80 y.o. F with CKD IV baseline 1.8, pHTN, dCHF, DM, OSA on CPAP, Afib/flutter s/p DCCV on Eliquis, Amio and hx SDH remote who presented with knee pain/swelling and neck stiffness.   Recently seen in ER, given Voltaren, lidocaine patch, but unable to move or feed self at home.    In the ER, BP 104/50, WBC 18K, Cr 2.95 mg/dL from baseline 1.8.  X-rays of neck and knee still unremarkable.   10/4 admitted and started on fluids for AKI    During the day today, patient appeared increasingly dyspneic and rhonchorous.  CXR without edema, but BNP elevated.  IV fluids stopped.   Repeat BMP tomorrow, if still rhonchorous and Cr no improvement, AKI may be congestive and will start lasix.  If no clinical edema tomorrow, and Cr improving, will resume IV fluids for AKI   This is a no charge note, for further details, please see documentation from Dr. Damita Dunnings from earlier today.

## 2021-05-21 NOTE — Assessment & Plan Note (Addendum)
Imaging negative.

## 2021-05-21 NOTE — Evaluation (Signed)
Physical Therapy Evaluation Patient Details Name: Ann Flynn MRN: [id number] DOB: Mar 12, 1941 Today's Date: 05/21/2021  History of Present Illness  80 year old female with history of diastolic CHF, moderate pulmonary hypertension, IDDM 2, CKD stage IV, OSA on CPAP, a flutter s/p cardioversion on 4/22 on Eliquis.  She presented to the ED for the second time in 3 days with complaints of neck pain and knee pain with difficulty bearing weight as well as generalized weakness  Clinical Impression  Pt was willing to try getting up and moving with PT, however she reported pain with essentially all movement and was anxious and hesitant to even try standing.  She struggled to maintain sitting for ~3 minutes.  She reports she was able to be up and out of the house for her birthday just a few weeks ago but now recently has struggled to even get around minimally in the home.  Pt will need STR once cleared for d/c as she is unsafe to return home with her current functional status.     Recommendations for follow up therapy are one component of a multi-disciplinary discharge planning process, led by the attending physician.  Recommendations may be updated based on patient status, additional functional criteria and insurance authorization.  Follow Up Recommendations SNF;Supervision/Assistance - 24 hour    Equipment Recommendations   (TBD at rehab)    Recommendations for Other Services       Precautions / Restrictions Precautions Precautions: None Restrictions Weight Bearing Restrictions: No      Mobility  Bed Mobility Overal bed mobility: Needs Assistance Bed Mobility: Sit to Supine;Supine to Sit     Supine to sit: Mod assist Sit to supine: Mod assist   General bed mobility comments: Pt struggled to get up to sitting.  She attempted X 2 with no/little assist unsuccessfully.  Ultimatley needed heavier assist and encouragement just to attain sitting, pt anxious about pain the entire time     Transfers                 General transfer comment: pt unable to even attempt, deferred 2/2 pt's pain and anxiety with static sitting ~ 3 minutes EOB  Ambulation/Gait                Stairs            Wheelchair Mobility    Modified Rankin (Stroke Patients Only)       Balance Overall balance assessment: Needs assistance Sitting-balance support: Single extremity supported Sitting balance-Leahy Scale: Fair Sitting balance - Comments: Pt clearly uncomfortable (pain) in sitting, able to maintain for 2-3 minutes with occasional direct assist but quick to fatigue, have increased pain and anxiety about being able to even maintain sitting much less attempt standing.                                     Pertinent Vitals/Pain Pain Assessment: 0-10 Pain Score: 8  Pain Location: neck, L knee, general soreness everywhere    Home Living Family/patient expects to be discharged to:: Skilled nursing facility Living Arrangements: Alone               Additional Comments: daughter lives close    Prior Function Level of Independence: Independent with assistive device(s)         Comments: apparently up until ~2 weeks ago pt was managing relatively well with daily calls but only infrequent in-person  assist from daughter     Hand Dominance        Extremity/Trunk Assessment   Upper Extremity Assessment Upper Extremity Assessment: Generalized weakness    Lower Extremity Assessment Lower Extremity Assessment: Generalized weakness (pain hesitant with most activity, especially hesitant to do anything (bend, push with, etc) the L knee)       Communication   Communication: No difficulties  Cognition Arousal/Alertness: Awake/alert Behavior During Therapy: Restless;Anxious Overall Cognitive Status: Within Functional Limits for tasks assessed                                        General Comments General comments (skin  integrity, edema, etc.): Pt with L knee, neck, headache and general body pain that is functionally limiting.  L knee warm to touch but similar to R    Exercises     Assessment/Plan    PT Assessment Patient needs continued PT services  PT Problem List Decreased strength;Decreased range of motion;Decreased activity tolerance;Decreased balance;Decreased safety awareness;Decreased knowledge of use of DME;Decreased mobility;Pain       PT Treatment Interventions DME instruction;Gait training;Functional mobility training;Therapeutic activities;Therapeutic exercise;Balance training;Patient/family education    PT Goals (Current goals can be found in the Care Plan section)  Acute Rehab PT Goals Patient Stated Goal: Get back to walking PT Goal Formulation: With family Time For Goal Achievement: 06/04/21 Potential to Achieve Goals: Good    Frequency Min 2X/week   Barriers to discharge        Co-evaluation               AM-PAC PT "6 Clicks" Mobility  Outcome Measure Help needed turning from your back to your side while in a flat bed without using bedrails?: A Little Help needed moving from lying on your back to sitting on the side of a flat bed without using bedrails?: A Lot Help needed moving to and from a bed to a chair (including a wheelchair)?: Total Help needed standing up from a chair using your arms (e.g., wheelchair or bedside chair)?: A Lot Help needed to walk in hospital room?: Total Help needed climbing 3-5 steps with a railing? : Total 6 Click Score: 10    End of Session Equipment Utilized During Treatment: Gait belt Activity Tolerance: Patient limited by fatigue;Patient limited by pain Patient left: with call bell/phone within reach;with bed alarm set Nurse Communication: Mobility status PT Visit Diagnosis: Muscle weakness (generalized) (M62.81);Difficulty in walking, not elsewhere classified (R26.2);Pain Pain - Right/Left: Left Pain - part of body: Knee     Time: 0928-1000 PT Time Calculation (min) (ACUTE ONLY): 32 min   Charges:   PT Evaluation $PT Eval Low Complexity: 1 Low PT Treatments $Therapeutic Activity: 8-22 mins        Kreg Shropshire, DPT 05/21/2021, 12:49 PM

## 2021-05-21 NOTE — TOC Initial Note (Signed)
Transition of Care [facility]) - Initial/Assessment Note    Patient Details  Name: Ann Flynn MRN: [id number] Date of Birth: 05-21-1941  Transition of Care [facility]) CM/SW Contact:    Candie Chroman, LCSW Phone Number: 05/21/2021, 3:11 PM  Clinical Narrative:  CSW met with patient. No supports at bedside. CSW introduced role and explained that PT recommendations would be discussed. Patient initially not interested in SNF placement because she thought she would have to pay out of pocket. CSW explained how insurance covers and now she is agreeable to CSW sending out referral. Provided CMS scores for SNF's within 25 miles of her zip code. No further concerns. CSW encouraged patient to contact CSW as needed. CSW will continue to follow patient for support and facilitate discharge to SNF once medically stable.                Expected Discharge Plan: Skilled Nursing Facility Barriers to Discharge: Continued Medical Work up   Patient Goals and CMS Choice   CMS Medicare.gov Compare Post Acute Care list provided to:: Patient    Expected Discharge Plan and Services Expected Discharge Plan: [place] Choice: [place] arrangements for the past 2 months: Single Family Home                                      Prior Living Arrangements/Services Living arrangements for the past 2 months: Single Family Home Lives with:: Self Patient language and need for interpreter reviewed:: Yes Do you feel safe going back to the place where you live?: Yes      Need for Family Participation in Patient Care: Yes (Comment) Care giver support system in place?: Yes (comment)   Criminal Activity/Legal Involvement Pertinent to Current Situation/Hospitalization: No - Comment as needed  Activities of Daily Living Home Assistive Devices/Equipment: Gilford Rile (specify type) (4 wheels) ADL Screening (condition at time of admission) Patient's cognitive  ability adequate to safely complete daily activities?: Yes Is the patient deaf or have difficulty hearing?: No Does the patient have difficulty seeing, even when wearing glasses/contacts?: Yes Does the patient have difficulty concentrating, remembering, or making decisions?: Yes Patient able to express need for assistance with ADLs?: Yes Does the patient have difficulty dressing or bathing?: No Independently performs ADLs?: Yes (appropriate for developmental age) Does the patient have difficulty walking or climbing stairs?: Yes Weakness of Legs: Left Weakness of Arms/Hands: Both  Permission Sought/Granted Permission sought to share information with : Facility Art therapist granted to share information with : Yes, Verbal Permission Granted     Permission granted to share info w AGENCY: SNF's        Emotional Assessment Appearance:: Appears stated age Attitude/Demeanor/Rapport: Engaged, Gracious Affect (typically observed): Accepting, Appropriate, Calm, Pleasant Orientation: : Oriented to Self, Oriented to Place, Oriented to  Time, Oriented to Situation Alcohol / Substance Use: Not Applicable Psych Involvement: No (comment)  Admission diagnosis:  Weakness [R53.1] AKI (acute kidney injury) ([place]) [N17.9] Acute pain of left knee [M25.562] Acute renal insufficiency [N28.9] Patient Active Problem List   Diagnosis Date Noted   Effusion of left knee 05/21/2021   Hyponatremia 05/21/2021   Anemia in chronic kidney disease (CKD) 05/21/2021   Acute renal insufficiency 05/21/2021   Acute kidney injury superimposed on CKD IV (HCC) 05/21/2021   Neck pain 06/04/2021   DDD (degenerative disc disease), lumbar  12/14/2020   DDD (degenerative disc disease), cervical 11/27/2020   Syncope and collapse 08/05/2020   Atrial fibrillation, chronic ([place]) 03/24/2020   Diabetes mellitus, type II ([place]) 03/24/2020   Syncope 03/24/2020   Pulmonary hypertension ([place]) 03/24/2020    Pulmonary nodules 01/30/2020   Chronic pulmonary embolism, unspecified pulmonary embolism type, unspecified whether acute cor pulmonale present ([place]) 10/06/2019   Chronic respiratory failure with hypoxia ([place]) 10/06/2019   Morbid (severe) obesity due to excess calories ([place]) 10/06/2019   Hypertension associated with diabetes ([place]) 10/06/2019   Hyperlipidemia associated with type 2 diabetes mellitus ([place]) 10/06/2019   CKD (chronic kidney disease), stage IV ([place]) 04/01/2019   Allergic rhinitis 03/28/2019   Arthritis 03/28/2019   CHF (congestive heart failure) ([place]) 03/28/2019   OSA on CPAP 03/28/2019   PCP:  Birdie Sons, MD Pharmacy:   [facility] - [place], [place] - [address] [place] [address] Phone: [phone] Fax: [phone]  [facility]  ([place], [facility] [address] [place] [address] [place] [address] Phone: [phone] Fax: [phone]  [facility] ([facility]) - [place], [place] - [address] [place] [address] Phone: [phone] Fax: [phone]  [facility] - [place], [place] - [address] [place] [address] Phone: [phone] Fax: [phone]     Social Determinants of Health (SDOH) Interventions    Readmission Risk Interventions No flowsheet data found.

## 2021-05-21 NOTE — H&P (Signed)
History and Physical    ENSLEY BLAS ZOX:[id number] DOB: 08-10-1941 DOA: 05/18/2021  PCP: Birdie Sons, MD   Patient coming from: home  I have personally briefly reviewed patient's old medical records in [place]  Chief Complaint: weakness  HPI: Ann Flynn is a 80 y.o. female with medical history significant for diastolic CHF, moderate pulmonary hypertension, IDDM 2, CKD stage IV, OSA on CPAP, , a flutter s/p cardioversion on 4/22 on Eliquis and amiodarone, who presents to the ED for the second time in 3 days with complaints of neck pain and knee pain with difficulty bearing weight and difficulty moving around the house where she lives alone with a daughter that lives nearby.  She denies other complaints.  On her first visit to the ED on 10/1 she had an essentially negative work-up and was discharged with Voltaren gel and a Lidoderm patch for suspected neck strain.  She was brought back to the emergency room on 10/3 because family noted her to be increasingly weak, at this time not explained by her neck pain or knee pain.  She had no cough chest pain or shortness of breath, no nausea or vomiting, but family is concerned for her oral intake given decreasing ability to care for herself at home  ED course on arrival soft blood pressure of 104/50 with otherwise normal vitals blood work with WBC 18,000, and initial creatinine 2.95 up from 1.8 a couple days prior.  EKG, personally viewed and interpreted: Sinus bradycardia at 58 with no acute ST-T wave changes  Imaging: X-rays C-spine with degenerative changes X-ray left knees moderate-sized joint effusion without evidence of fracture X-ray left hip no acute osseous abnormality  Patient given an IV fluid bolus and hospitalist consulted for admission.  Review of Systems: As per HPI otherwise all other systems on review of systems negative.    Past Medical History:  Diagnosis Date   A-fib ([place])    Anemia    CHF (congestive  heart failure) (HCC)    Chronic kidney disease    Diabetes mellitus without complication ([place])    Hypertension    Other cervical disc degeneration at C4-C5 level    severe _0  5   moderate 2 3 level   Pulmonary hypertension ([place])    Subdural hematoma, acute 03/24/2020   Syncope 07/2020    Past Surgical History:  Procedure Laterality Date   A-FLUTTER ABLATION N/A 01/18/2021   Procedure: A-FLUTTER ABLATION;  Surgeon: Constance Haw, MD;  Location: [place] [facility];  Service: Cardiovascular;  Laterality: N/A;   ABDOMINAL HYSTERECTOMY  1997   ABDOMINAL HYSTERECTOMY     CARDIOVERSION N/A 07/10/2020   Procedure: CARDIOVERSION;  Surgeon: Jolaine Artist, MD;  Location: [place];  Service: Cardiovascular;  Laterality: N/A;   CARDIOVERSION N/A 12/07/2020   Procedure: CARDIOVERSION;  Surgeon: Jolaine Artist, MD;  Location: [facility];  Service: Cardiovascular;  Laterality: N/A;   cartiod artery surgery  2010   per patient    CHOLECYSTECTOMY  10/19/2009   masectomy     [place]   RIGHT HEART CATH N/A 07/29/2019   Procedure: RIGHT HEART CATH;  Surgeon: Jolaine Artist, MD;  Location: [place] [facility];  Service: Cardiovascular;  Laterality: N/A;   RIGHT HEART CATH N/A 05/11/2020   Procedure: RIGHT HEART CATH;  Surgeon: Jolaine Artist, MD;  Location: [place] [facility];  Service: Cardiovascular;  Laterality: N/A;   RIGHT HEART CATH N/A 08/07/2020  Procedure: RIGHT HEART CATH;  Surgeon: Jolaine Artist, MD;  Location: [place] [facility];  Service: Cardiovascular;  Laterality: N/A;     reports that she has never smoked. She has never used smokeless tobacco. She reports that she does not drink alcohol and does not use drugs.  Allergies  Allergen Reactions   Penicillins Swelling    Facial swelling Did it involve swelling of the face/tongue/throat, SOB, or low BP? Yes Did it involve sudden or severe rash/hives, skin peeling, or any reaction on the  inside of your mouth or nose? No Did you need to seek medical attention at a hospital or doctor's office? No When did it last happen?      10 + years If all above answers are "NO", may proceed with cephalosporin use.    Sulfa Antibiotics Rash   Uptravi [Selexipag] Other (See Comments)    headaches    Family History  Problem Relation Age of Onset   CAD Mother    Heart disease Father    CAD Maternal Grandmother       Prior to Admission medications   Medication Sig Start Date End Date Taking? Authorizing Provider  acetaminophen (TYLENOL) 325 MG tablet Take 650 mg by mouth in the morning and at bedtime.    [provider]  ambrisentan (LETAIRIS) 5 MG tablet Take 1 tablet (5 mg total) by mouth daily. 05/03/21   Flora Lipps, MD  apixaban (ELIQUIS) 5 MG TABS tablet Take 1 tablet (5 mg total) by mouth 2 (two) times daily. 02/05/21 05/06/21  Camnitz, Ocie Doyne, MD  atorvastatin (LIPITOR) 80 MG tablet Take 1 tablet (80 mg total) by mouth daily. 09/24/20   Trinna Post, PA-C  b complex vitamins capsule Take 1 capsule by mouth daily.    [provider]  Biotin 1000 MCG tablet Take 1,000 mcg by mouth in the morning and at bedtime.    [provider]  Cholecalciferol (VITAMIN D3) 50 MCG (2000 UT) TABS Take 2,000 Units by mouth in the morning and at bedtime.    [provider]  diclofenac Sodium (VOLTAREN) 1 % GEL Apply 2 g topically 4 (four) times daily. 05/18/21   Carrie Mew, MD  Dulaglutide (TRULICITY) 3 TT/0.1XB SOPN Inject 3 mg as directed once a week. Patient receives through [employer] Patient Assistance through December 2022 04/15/21   Birdie Sons, MD  ferrous sulfate 325 (65 FE) MG tablet Take 325 mg by mouth daily with breakfast.    [provider]  glucose blood (ONETOUCH ULTRA) test strip Use to check blood sugar once daily as instructed 01/22/21   Birdie Sons, MD  HYDROcodone-acetaminophen (NORCO) 7.5-325 MG tablet Take 1  tablet by mouth every 6 (six) hours as needed for moderate pain. 01/01/21   Birdie Sons, MD  insulin glargine, 1 Unit Dial, (TOUJEO SOLOSTAR) 300 UNIT/ML Solostar Pen Inject 30 Units into the skin daily. 04/23/21   Birdie Sons, MD  Insulin Pen Needle (BD PEN NEEDLE NANO U/F) 32G X 4 MM MISC Use to inject insulin once daily as directed 12/26/20   Birdie Sons, MD  Lancets [facility] ULTRASOFT) lancets Use to check blood sugars once daily as instructed 01/22/21   Birdie Sons, MD  lidocaine (LIDODERM) 5 % Place 1 patch onto the skin every 12 (twelve) hours. Remove & Discard patch within 12 hours or as directed by MD 05/18/21   Carrie Mew, MD  losartan (COZAAR) 25 MG tablet Take  25 mg by mouth daily. 05/02/21 05/02/22  [provider]  metoprolol succinate (TOPROL-XL) 25 MG 24 hr tablet Take 0.5 tablets (12.5 mg total) by mouth in the morning. 04/23/21   Birdie Sons, MD  omeprazole (PRILOSEC) 40 MG capsule TAKE ONE CAPSULE BY MOUTH EVERY MORNING and TAKE ONE CAPSULE BY MOUTH EVERYDAY AT BEDTIME 03/29/21   Birdie Sons, MD  PRESCRIPTION MEDICATION CPAP: At bedtime    [provider]  tadalafil, PAH, (ADCIRCA) 20 MG tablet TAKE 2 TABLETS BY MOUTH AT  BEDTIME 04/15/21   Flora Lipps, MD  torsemide (DEMADEX) 20 MG tablet Take 1 tablet (20 mg total) by mouth daily. 01/21/21   Lyda Jester M, PA-C  venlafaxine (EFFEXOR) 75 MG tablet TAKE ONE TABLET BY MOUTH EVERY MORNING 03/29/21   Birdie Sons, MD    Physical Exam: Vitals:   06/08/2021 2039 06/09/2021 2044  BP: (!) 104/50   Pulse: 76   Resp: 18   Temp: 98.4 F (36.9 C)   TempSrc: Oral   SpO2: 97%   Weight:  106.6 kg  Height:  _0  (1.651 m)     Vitals:   06/17/2021 2039 06/16/2021 2044  BP: (!) 104/50   Pulse: 76   Resp: 18   Temp: 98.4 F (36.9 C)   TempSrc: Oral   SpO2: 97%   Weight:  106.6 kg  Height:  _1  (1.651 m)    Constitutional: Alert and oriented x 3 . Not in any apparent  distress HEENT:      Head: Normocephalic and atraumatic.         Eyes: PERLA, EOMI, Conjunctivae are normal. Sclera is non-icteric.       Mouth/Throat: Mucous membranes are moist.       Neck: Supple with no signs of meningismus. Cardiovascular: Regular rate and rhythm. No murmurs, gallops, or rubs. 2+ symmetrical distal pulses are present . No JVD. No LE edema Respiratory: Respiratory effort normal .Lungs sounds clear bilaterally. No wheezes, crackles, or rhonchi.  Gastrointestinal: Soft, non tender, and non distended with positive bowel sounds.  Genitourinary: No CVA tenderness. Musculoskeletal: Pain on range of motion left knee. No cyanosis, or erythema of extremities. Neurologic:  Face is symmetric. Moving all extremities. No gross focal neurologic deficits . Skin: Skin is warm, dry.  No rash or ulcers Psychiatric: Mood and affect are normal    Labs on Admission: I have personally reviewed following labs and imaging studies  CBC: Recent Labs  Lab 05/18/21 1402 06/14/2021 2222  WBC 10.5 18.0*  NEUTROABS  --  16.4*  HGB 8.9* 9.8*  HCT 29.3* 31.2*  MCV 100.0 98.1  PLT 214 728   Basic Metabolic Panel: Recent Labs  Lab 05/18/21 1402  NA 138  K 4.9  CL 103  CO2 26  GLUCOSE 197*  BUN 40*  CREATININE 1.81*  CALCIUM 8.6*   GFR: Estimated Creatinine Clearance: 30.1 mL/min (A) (by C-G formula based on SCr of 1.81 mg/dL (H)). Liver Function Tests: No results for input(s): AST, ALT, ALKPHOS, BILITOT, PROT, ALBUMIN in the last 168 hours. No results for input(s): LIPASE, AMYLASE in the last 168 hours. No results for input(s): AMMONIA in the last 168 hours. Coagulation Profile: No results for input(s): INR, PROTIME in the last 168 hours. Cardiac Enzymes: No results for input(s): CKTOTAL, CKMB, CKMBINDEX, TROPONINI in the last 168 hours. BNP (last 3 results) No results for input(s): PROBNP in the last 8760 hours. HbA1C: No results for input(s): HGBA1C  in the last 72  hours. CBG: No results for input(s): GLUCAP in the last 168 hours. Lipid Profile: No results for input(s): CHOL, HDL, LDLCALC, TRIG, CHOLHDL, LDLDIRECT in the last 72 hours. Thyroid Function Tests: No results for input(s): TSH, T4TOTAL, FREET4, T3FREE, THYROIDAB in the last 72 hours. Anemia Panel: No results for input(s): VITAMINB12, FOLATE, FERRITIN, TIBC, IRON, RETICCTPCT in the last 72 hours. Urine analysis:    Component Value Date/Time   COLORURINE YELLOW 08/05/2020 1718   APPEARANCEUR CLEAR 08/05/2020 1718   LABSPEC 1.008 08/05/2020 1718   PHURINE 5.0 08/05/2020 1718   GLUCOSEU 150 (A) 08/05/2020 1718   HGBUR SMALL (A) 08/05/2020 1718   BILIRUBINUR NEGATIVE 08/05/2020 1718   KETONESUR NEGATIVE 08/05/2020 1718   PROTEINUR NEGATIVE 08/05/2020 1718   NITRITE POSITIVE (A) 08/05/2020 1718   LEUKOCYTESUR TRACE (A) 08/05/2020 1718    Radiological Exams on Admission: DG Cervical Spine Complete  Result Date: 06/16/2021 CLINICAL DATA:  Persistent pain EXAM: CERVICAL SPINE - COMPLETE 4+ VIEW COMPARISON:  CT 03/23/2020 FINDINGS: Inadequate visualization C7 and cervicothoracic junction. Trace anterolisthesis C3 on C4, C4 on C5 and C5 on C6. Vertebral body heights are maintained. Mild disc space narrowing C5-C6. Dens and lateral masses are within normal limits. IMPRESSION: Trace anterolisthesis C3 through C6 with mild degenerative change at C5-C6. C7 and cervicothoracic junction are poorly visible Electronically Signed   By: Donavan Foil M.D.   On: 05/25/2021 21:18   DG Knee Complete 4 Views Left  Result Date: 06/04/2021 CLINICAL DATA:  Left knee pain. EXAM: LEFT KNEE - COMPLETE 4+ VIEW COMPARISON:  None. FINDINGS: No evidence of acute fracture or dislocation. Moderate severity medial tibiofemoral compartment space narrowing is seen. A moderate sized joint effusion is noted. IMPRESSION: 1. Moderate sized joint effusion without evidence of acute fracture. Electronically Signed   By: Virgina Norfolk M.D.   On: 06/04/2021 21:20   DG Hip Unilat W or Wo Pelvis 2-3 Views Left  Result Date: 06/11/2021 CLINICAL DATA:  Left hip pain EXAM: DG HIP (WITH OR WITHOUT PELVIS) 2-3V LEFT COMPARISON:  None. FINDINGS: SI joints are non widened. Pubic symphysis and rami appear intact. Mild degenerative changes of the right hip. No fracture or malalignment on the left. IMPRESSION: No acute osseous abnormality Electronically Signed   By: Donavan Foil M.D.   On: 06/04/2021 22:16     Assessment/Plan 80 year old female with history of diastolic CHF, moderate pulmonary hypertension, IDDM 2, CKD stage IV, OSA on CPAP, , a flutter s/p cardioversion on 4/22 on Eliquis and , who presents to the ED for the second time in 3 days with complaints of neck pain and knee pain with difficulty bearing weight as well as generalized weakness    Acute kidney injury superimposed on CKD IV (HCC)   Generalized weakness - Creatinine 2.95, up from 1.8 on 10/1 likely related to dehydration from poor oral intake related to inability to care for self, as well as diuretic meds for CHF - IV hydration, and monitor for fluid overload with CHF history - Monitor renal function and avoid nephrotoxins    Neck pain   Effusion of left knee with ambulatory dysfunction - Diagnostic x-rays neck and knee significant for moderate effusion left knee - Continue Lidoderm - Consider Ortho consult for joint aspiration - Non-NSAID pain meds - PT OT eval/TOC consult  Diastolic CHF - Appears more dry.  Not overloaded - Hold losartan and furosemide because of worsening kidney function - Continue metoprolol - Daily  weights - Monitor for fluid overload given IV hydration    Diabetes mellitus, type II (HCC) - Sliding scale insulin coverage    PAF (paroxysmal atrial fibrillation) ([place]) - Patient had successful cardioversion on 4/22 and is in sinus rhythm - Continue Eliquis and metoprolol    OSA on CPAP - CPAP    DVT prophylaxis:  Eliquis Code Status: full code  Family Communication:  none  Disposition Plan: Back to previous home environment Consults called: none  Status: Observation    Athena Masse MD [facility]     05/21/2021, 12:09 AM

## 2021-05-22 ENCOUNTER — Inpatient Hospital Stay: Payer: Medicare Other

## 2021-05-22 DIAGNOSIS — N179 Acute kidney failure, unspecified: Secondary | ICD-10-CM | POA: Diagnosis not present

## 2021-05-22 DIAGNOSIS — N189 Chronic kidney disease, unspecified: Secondary | ICD-10-CM | POA: Diagnosis not present

## 2021-05-22 LAB — GLUCOSE, CAPILLARY
Glucose-Capillary: 206 mg/dL — ABNORMAL HIGH (ref 70–99)
Glucose-Capillary: 260 mg/dL — ABNORMAL HIGH (ref 70–99)
Glucose-Capillary: 320 mg/dL — ABNORMAL HIGH (ref 70–99)
Glucose-Capillary: 306 mg/dL — ABNORMAL HIGH (ref 70–99)

## 2021-05-22 LAB — CBC
HCT: 27.3 % — ABNORMAL LOW (ref 36.0–46.0)
Hemoglobin: 8.5 g/dL — ABNORMAL LOW (ref 12.0–15.0)
MCH: 29.9 pg (ref 26.0–34.0)
MCHC: 31.1 g/dL (ref 30.0–36.0)
MCV: 96.1 fL (ref 80.0–100.0)
Platelets: 224 10*3/uL (ref 150–400)
RBC: 2.84 MIL/uL — ABNORMAL LOW (ref 3.87–5.11)
RDW: 15.8 % — ABNORMAL HIGH (ref 11.5–15.5)
WBC: 15.3 10*3/uL — ABNORMAL HIGH (ref 4.0–10.5)
nRBC: 0 % (ref 0.0–0.2)

## 2021-05-22 LAB — HEPATIC FUNCTION PANEL
ALT: 10 U/L (ref 0–44)
AST: 18 U/L (ref 15–41)
Albumin: 2.2 g/dL — ABNORMAL LOW (ref 3.5–5.0)
Alkaline Phosphatase: 69 U/L (ref 38–126)
Bilirubin, Direct: 0.3 mg/dL — ABNORMAL HIGH (ref 0.0–0.2)
Indirect Bilirubin: 1 mg/dL — ABNORMAL HIGH (ref 0.3–0.9)
Total Bilirubin: 1.3 mg/dL — ABNORMAL HIGH (ref 0.3–1.2)
Total Protein: 5.6 g/dL — ABNORMAL LOW (ref 6.5–8.1)

## 2021-05-22 LAB — BLOOD GAS, ARTERIAL
Acid-base deficit: 2.3 mmol/L — ABNORMAL HIGH (ref 0.0–2.0)
Allens test (pass/fail): POSITIVE — AB
Bicarbonate: 19.9 mmol/L — ABNORMAL LOW (ref 20.0–28.0)
FIO2: 0.32
O2 Saturation: 99.1 %
Patient temperature: 37
pCO2 arterial: 25 mmHg — ABNORMAL LOW (ref 32.0–48.0)
pH, Arterial: 7.51 — ABNORMAL HIGH (ref 7.350–7.450)
pO2, Arterial: 126 mmHg — ABNORMAL HIGH (ref 83.0–108.0)

## 2021-05-22 LAB — BASIC METABOLIC PANEL
Anion gap: 9 (ref 5–15)
BUN: 87 mg/dL — ABNORMAL HIGH (ref 8–23)
CO2: 23 mmol/L (ref 22–32)
Calcium: 8.2 mg/dL — ABNORMAL LOW (ref 8.9–10.3)
Chloride: 98 mmol/L (ref 98–111)
Creatinine, Ser: 2.98 mg/dL — ABNORMAL HIGH (ref 0.44–1.00)
GFR, Estimated: 15 mL/min — ABNORMAL LOW (ref >60)
Glucose, Bld: 221 mg/dL — ABNORMAL HIGH (ref 70–99)
Potassium: 5 mmol/L (ref 3.5–5.1)
Sodium: 130 mmol/L — ABNORMAL LOW (ref 135–145)

## 2021-05-22 LAB — FERRITIN: Ferritin: 294 ng/mL (ref 11–307)

## 2021-05-22 LAB — URINALYSIS, COMPLETE (UACMP) WITH MICROSCOPIC
Bilirubin Urine: NEGATIVE
Glucose, UA: NEGATIVE mg/dL
Ketones, ur: NEGATIVE mg/dL
Nitrite: NEGATIVE
Protein, ur: 100 mg/dL — AB
Specific Gravity, Urine: 1.014 (ref 1.005–1.030)
pH: 5 (ref 5.0–8.0)

## 2021-05-22 LAB — HEPATITIS B SURFACE ANTIGEN: Hepatitis B Surface Ag: NONREACTIVE

## 2021-05-22 LAB — FOLATE: Folate: 22 ng/mL (ref >5.9)

## 2021-05-22 LAB — IRON AND TIBC
Iron: 10 ug/dL — ABNORMAL LOW (ref 28–170)
Saturation Ratios: 6 % — ABNORMAL LOW (ref 10.4–31.8)
TIBC: 171 ug/dL — ABNORMAL LOW (ref 250–450)
UIBC: 161 ug/dL

## 2021-05-22 LAB — HEPATITIS B SURFACE ANTIBODY,QUALITATIVE: Hep B S Ab: NONREACTIVE

## 2021-05-22 LAB — HEPATITIS B CORE ANTIBODY, IGM: Hep B C IgM: NONREACTIVE

## 2021-05-22 LAB — HEPATITIS B CORE ANTIBODY, TOTAL: Hep B Core Total Ab: NONREACTIVE

## 2021-05-22 IMAGING — MR MR CARD MORPHOLOGY WO/W CM
45 of 48 series · 45 of 48 positions shown · IV contrast (gadavist)
Comparison: none

CLINICAL DATA: Evaluate for shunt

EXAM:
CARDIAC MRI
TECHNIQUE: The patient was scanned on a 1.5 Tesla Siemens magnet. A dedicated
cardiac coil was used. Functional imaging was done using Fiesta
sequences. [DATE], and 4 chamber views were done to assess for RWMA's.
Modified Erxleben rule using a short axis stack was used to
calculate an ejection fraction on a dedicated work station using
Circle software. The patient received 10 cc of Gadavist. After 10
minutes inversion recovery sequences were used to assess for
infiltration and scar tissue.
CONTRAST:  10 cc  of Gadavist

[Series 4: [id number] · axial · 8.0mm · 1.60mm/px · 1 of 16 slices shown]
[im 1/16]
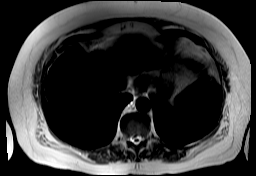

[Series 8: bSSFP · oblique · 8.0mm · 1.61mm/px · 1 of 25 slices shown (1 of 23)]
[im 1/25]
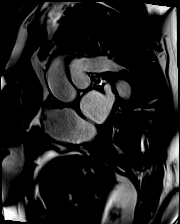

[Series 9: bSSFP · oblique · 8.0mm · 1.61mm/px · 1 of 25 slices shown (2 of 23)]
[im 1/25]
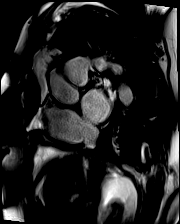

[Series 10: bSSFP · oblique · 8.0mm · 1.61mm/px · 1 of 25 slices shown (3 of 23)]
[im 1/25]
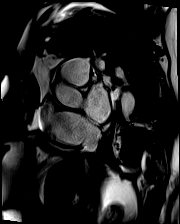

[Series 11: bSSFP · oblique · 8.0mm · 1.61mm/px · 1 of 25 slices shown (4 of 23)]
[im 1/25]
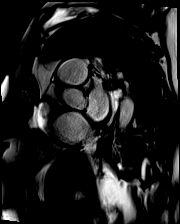

[Series 12: bSSFP · oblique · 8.0mm · 1.61mm/px · 1 of 25 slices shown (5 of 23)]
[im 1/25]
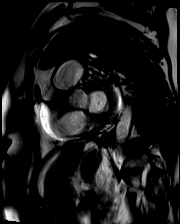

[Series 13: bSSFP · oblique · 8.0mm · 1.61mm/px · 1 of 25 slices shown (6 of 23)]
[im 1/25]
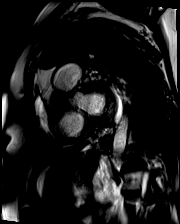

[Series 14: bSSFP · oblique · 8.0mm · 1.61mm/px · 1 of 25 slices shown (7 of 23)]
[im 1/25]
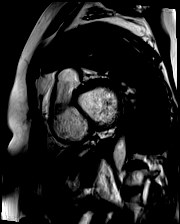

[Series 15: bSSFP · oblique · 8.0mm · 1.61mm/px · 1 of 25 slices shown (8 of 23)]
[im 1/25]
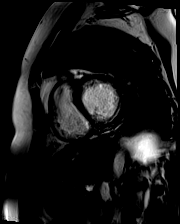

[Series 16: bSSFP · oblique · 8.0mm · 1.61mm/px · 1 of 25 slices shown (9 of 23)]
[im 1/25]
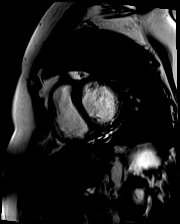

[Series 17: bSSFP · oblique · 8.0mm · 1.61mm/px · 1 of 25 slices shown (10 of 23)]
[im 1/25]
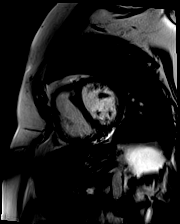

[Series 18: bSSFP · oblique · 8.0mm · 1.61mm/px · 1 of 25 slices shown (11 of 23)]
[im 1/25]
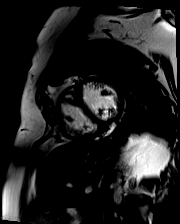

[Series 19: bSSFP · oblique · 8.0mm · 1.61mm/px · 1 of 25 slices shown (12 of 23)]
[im 1/25]
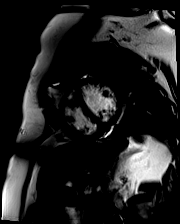

[Series 20: bSSFP · oblique · 8.0mm · 1.61mm/px · 1 of 25 slices shown (13 of 23)]
[im 1/25]
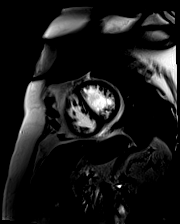

[Series 21: bSSFP · oblique · 8.0mm · 1.61mm/px · 1 of 25 slices shown (14 of 23)]
[im 1/25]
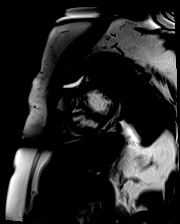

[Series 22: bSSFP · oblique · 8.0mm · 1.61mm/px · 1 of 25 slices shown (15 of 23)]
[im 1/25]
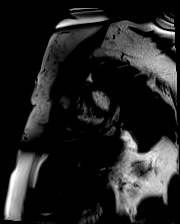

[Series 23: bSSFP · oblique · 8.0mm · 1.61mm/px · 1 of 25 slices shown (16 of 23)]
[im 1/25]
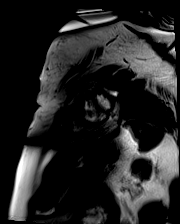

[Series 24: bSSFP · oblique · 8.0mm · 1.61mm/px · 1 of 25 slices shown (17 of 23)]
[im 1/25]
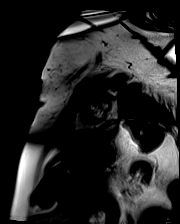

[Series 25: bSSFP · oblique · 8.0mm · 1.61mm/px · 1 of 25 slices shown (18 of 23)]
[im 1/25]
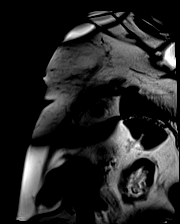

[Series 26: bSSFP · oblique · 8.0mm · 1.61mm/px · 1 of 25 slices shown (19 of 23)]
[im 1/25]
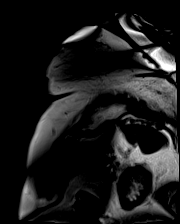

[Series 27: bSSFP · axial · 6.0mm · 1.41mm/px · 1 of 25 slices shown (20 of 23)]
[im 1/25]
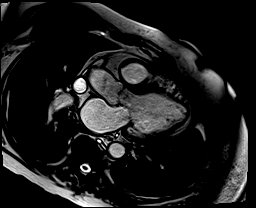

[Series 28: bSSFP · oblique · 6.0mm · 1.41mm/px · 1 of 25 slices shown (21 of 23)]
[im 1/25]
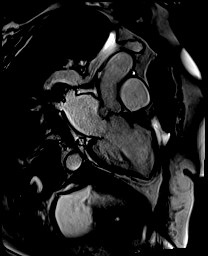

[Series 29: bSSFP · axial · 6.0mm · 1.41mm/px · 1 of 25 slices shown (22 of 23)]
[im 1/25]
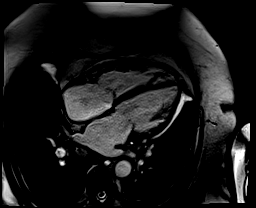

[Series 30: (id)_long_t1 · oblique · 8.0mm · 1.60mm/px · 1 of 24 slices shown]
[im 1/24]
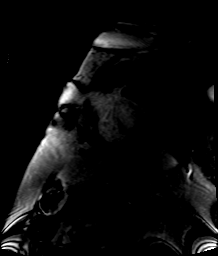

[Series 31: (id)_long_t1_moco · oblique · 8.0mm · 1.60mm/px · 1 of 24 slices shown]
[im 1/24]
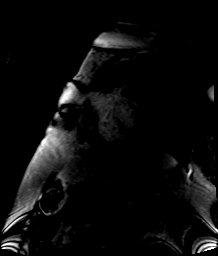

[Series 34: (id)_trufi · oblique · 8.0mm · 2.14mm/px · 1 of 9 slices shown]
[im 1/9]
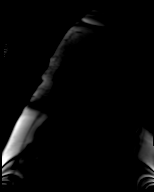

[Series 35: (id)_trufi_moco · oblique · 8.0mm · 2.14mm/px · 1 of 9 slices shown]
[im 1/9]
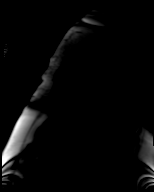

[Series 38: cine_trufi_cs_rt_short axis · oblique · 8.0mm · 1.73mm/px · 1 of 49 slices shown (1 of 16)]
[im 1/49]
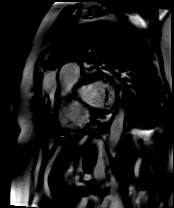

[Series 38: cine_trufi_cs_rt_short axis · oblique · 8.0mm · 1.73mm/px · 1 of 49 slices shown (2 of 16)]
[im 1/49]
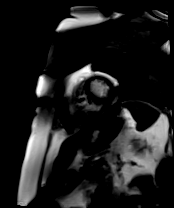

[Series 38: cine_trufi_cs_rt_short axis · oblique · 8.0mm · 1.73mm/px · 1 of 49 slices shown (3 of 16)]
[im 1/49]
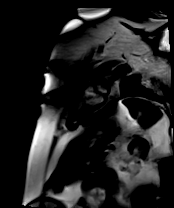

[Series 38: cine_trufi_cs_rt_short axis · oblique · 8.0mm · 1.73mm/px · 1 of 49 slices shown (4 of 16)]
[im 1/49]
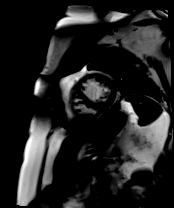

[Series 38: cine_trufi_cs_rt_short axis · oblique · 8.0mm · 1.73mm/px · 1 of 49 slices shown (5 of 16)]
[im 1/49]
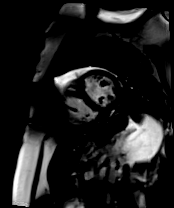

[Series 38: cine_trufi_cs_rt_short axis · oblique · 8.0mm · 1.73mm/px · 1 of 49 slices shown (6 of 16)]
[im 1/49]
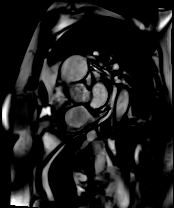

[Series 38: cine_trufi_cs_rt_short axis · oblique · 8.0mm · 1.73mm/px · 1 of 49 slices shown (7 of 16)]
[im 1/49]
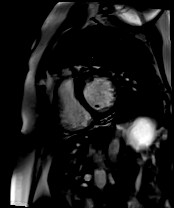

[Series 38: cine_trufi_cs_rt_short axis · oblique · 8.0mm · 1.73mm/px · 1 of 49 slices shown (8 of 16)]
[im 1/49]
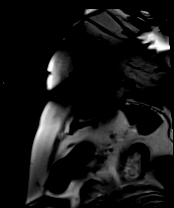

[Series 38: cine_trufi_cs_rt_short axis · oblique · 8.0mm · 1.73mm/px · 1 of 49 slices shown (9 of 16)]
[im 1/49]
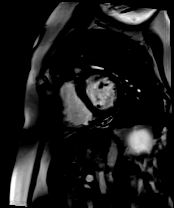

[Series 38: cine_trufi_cs_rt_short axis · oblique · 8.0mm · 1.73mm/px · 1 of 49 slices shown (10 of 16)]
[im 1/49]
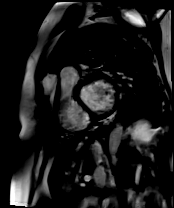

[Series 38: cine_trufi_cs_rt_short axis · oblique · 8.0mm · 1.73mm/px · 1 of 49 slices shown (11 of 16)]
[im 1/49]
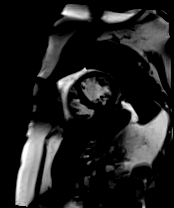

[Series 38: cine_trufi_cs_rt_short axis · oblique · 8.0mm · 1.73mm/px · 1 of 49 slices shown (12 of 16)]
[im 1/49]
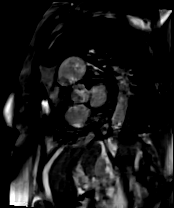

[Series 38: cine_trufi_cs_rt_short axis · oblique · 8.0mm · 1.73mm/px · 1 of 49 slices shown (13 of 16)]
[im 1/49]
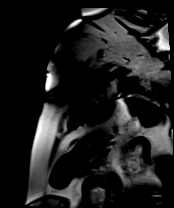

[Series 38: cine_trufi_cs_rt_short axis · oblique · 8.0mm · 1.73mm/px · 1 of 49 slices shown (14 of 16)]
[im 1/49]
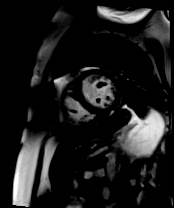

[Series 38: cine_trufi_cs_rt_short axis · oblique · 8.0mm · 1.73mm/px · 1 of 49 slices shown (15 of 16)]
[im 1/49]
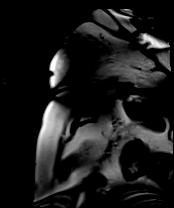

[Series 38: cine_trufi_cs_rt_short axis · oblique · 8.0mm · 1.73mm/px · 1 of 49 slices shown (16 of 16)]
[im 1/49]
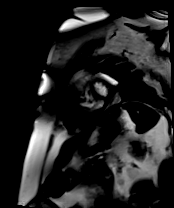

[Series 39: bSSFP · coronal · 6.0mm · 1.41mm/px · 1 of 25 slices shown (23 of 23)]
[im 1/25]
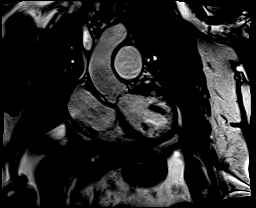

[Series 40: aortic valve cine · oblique · 6.0mm · 1.41mm/px · 1 of 25 slices shown]
[im 1/25]
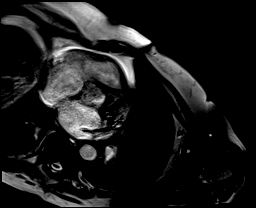

[45 of 48 positions shown; findings below may reference images not displayed]

FINDINGS: Left ventricle:

-Normal size

-Normal systolic function

-Elevated ECV (34%)

-RV insertion site LGE

LV EF:  51% (Normal 56-78%)

Absolute volumes:

LV EDV: 214mL (Normal 52-141 mL)

LV ESV: 105mL (Normal 13-51 mL)

LV SV: 109mL (Normal 33-97 mL)

CO: 6.8L/min (Normal 2.7-6.0 L/min)

Indexed volumes:

LV EDV: 95mL/sq-m (Normal 41-81 mL/sq-m)

LV ESV: 46mL/sq-m (Normal 12-21 mL/sq-m)

LV SV: 48mL/sq-m (Normal 26-56 mL/sq-m)

CI: 3.0L/min/sq-m (Normal 1.8-3.8 L/min/sq-m)

Right ventricle: Mild dilatation with normal systolic function

RV EF: 48% (Normal 47-80%)

Absolute volumes:

RV EDV: 220mL (Normal 58-154 mL)

RV ESV: 114mL (Normal 12-68 mL)

RV SV: 106mL (Normal 35-98 mL)

CO: 6.6L/min (Normal 2.7-6 L/min)

Indexed volumes:

RV EDV: 97mL/sq-m (Normal 48-87 mL/sq-m)

RV ESV: 50mL/sq-m (Normal 11-28 mL/sq-m)

RV SV: 47mL/sq-m (Normal 27-57 mL/sq-m)

CI: 2.9L/min/sq-m (Normal 1.8-3.8 L/min/sq-m)

Left atrium: Moderate enlargement

Right atrium: Moderate enlargement

Mitral valve: Mild regurgitation (regurgitant fraction 18%)

Aortic valve: Mild regurgitation (regurgitant fraction 15%)

Tricuspid valve: Mild regurgitation (regurgitant fraction 12%)

Pulmonic valve: Moderate regurgitation (regurgitant fraction 23%)

Pulmonary artery: Markedly dilated, measuring 52mm x 44mm

Aorta: Normal proximal ascending aorta

Pericardium: Small effusion
IMPRESSION: 1. Motion artifact throughout exam affects quantification of volumes

2.  No evidence of shunt with Qp/Qs =

3.  Normal LV size and systolic function (EF 51%)

4.  Mild RV dilatation with normal systolic function (EF 48%)

5.  Moderate pulmonary regurgitation (regurgitant fraction 23%)

6. RV insertion site late gadolinium enhancement, which is a
nonspecific finding often seen in setting of pulmonary hypertension

7.  Markedly dilated main pulmonary artery, measuring 52mm x 44mm

8. Mild mitral regurgitation (regurgitant fraction 18%), mild aortic
regurgitation (regurgitant fraction 15%), and mild tricuspid
regurgitation (regurgitant fraction 12%)

## 2021-05-22 MED ORDER — LEVOFLOXACIN IN D5W 750 MG/150ML IV SOLN
750.0000 mg | Freq: Once | INTRAVENOUS | Status: AC
  Administered 2021-05-22: 750 mg via INTRAVENOUS
  Filled 2021-05-22: qty 150

## 2021-05-22 MED ORDER — APIXABAN 2.5 MG PO TABS
2.5000 mg | ORAL_TABLET | Freq: Two times a day (BID) | ORAL | Status: DC
  Administered 2021-05-22 – 2021-05-25 (×6): 2.5 mg via ORAL
  Filled 2021-05-22 (×7): qty 1

## 2021-05-22 MED ORDER — LEVOFLOXACIN IN D5W 500 MG/100ML IV SOLN
500.0000 mg | INTRAVENOUS | Status: AC
  Administered 2021-05-24 – 2021-05-26 (×2): 500 mg via INTRAVENOUS
  Filled 2021-05-22 (×2): qty 100

## 2021-05-22 MED ORDER — METHYLPREDNISOLONE SODIUM SUCC 125 MG IJ SOLR
120.0000 mg | INTRAMUSCULAR | Status: DC
  Administered 2021-05-22: 120 mg via INTRAVENOUS
  Filled 2021-05-22: qty 2

## 2021-05-22 MED ORDER — FUROSEMIDE 10 MG/ML IJ SOLN
60.0000 mg | Freq: Once | INTRAMUSCULAR | Status: AC
  Administered 2021-05-22: 60 mg via INTRAVENOUS
  Filled 2021-05-22: qty 8

## 2021-05-22 MED ORDER — FUROSEMIDE 10 MG/ML IJ SOLN
60.0000 mg | Freq: Two times a day (BID) | INTRAMUSCULAR | Status: DC
  Administered 2021-05-22 – 2021-05-24 (×4): 60 mg via INTRAVENOUS
  Filled 2021-05-22 (×4): qty 8

## 2021-05-22 MED ORDER — AMBRISENTAN 5 MG PO TABS
5.0000 mg | ORAL_TABLET | Freq: Every day | ORAL | Status: DC
  Administered 2021-05-23 – 2021-05-25 (×4): 5 mg via ORAL
  Filled 2021-05-22 (×6): qty 1

## 2021-05-22 MED ORDER — AZITHROMYCIN 500 MG IV SOLR
500.0000 mg | INTRAVENOUS | Status: DC
  Filled 2021-05-22: qty 500

## 2021-05-22 MED ORDER — INSULIN GLARGINE-YFGN 100 UNIT/ML ~~LOC~~ SOLN
10.0000 [IU] | Freq: Every day | SUBCUTANEOUS | Status: DC
  Administered 2021-05-22: 10 [IU] via SUBCUTANEOUS
  Filled 2021-05-22 (×2): qty 0.1

## 2021-05-22 MED ORDER — CHLORHEXIDINE GLUCONATE CLOTH 2 % EX PADS
6.0000 | MEDICATED_PAD | Freq: Every day | CUTANEOUS | Status: DC
  Administered 2021-05-23 – 2021-05-26 (×4): 6 via TOPICAL

## 2021-05-22 MED ORDER — VENLAFAXINE HCL 37.5 MG PO TABS
75.0000 mg | ORAL_TABLET | Freq: Every day | ORAL | Status: DC
  Administered 2021-05-22 – 2021-05-23 (×2): 75 mg via ORAL
  Filled 2021-05-22 (×2): qty 2

## 2021-05-22 NOTE — TOC Progression Note (Addendum)
Transition of Care [facility]) - Progression Note    Patient Details  Name: Ann Flynn MRN: [id number] Date of Birth: 12-11-1940  [facility] CM/SW Contact  Beverly Sessions, RN Phone Number: 05/22/2021, 2:53 PM  Clinical Narrative:    Bed offer presented to patient. Patient defers to her daughter Ann Flynn to make decision.  Daughter in agreement to accept bed at [facility].  Accepted in the [place]. Notified Teresa at [employer].  Of note patient does not accept patients over the weekend  Per MD anticipated dc >2 days   Expected Discharge Plan: [place] Barriers to Discharge: Continued Medical Work up  Expected Discharge Plan and Services Expected Discharge Plan: [place] Choice: [place] arrangements for the past 2 months: Single Family Home                                       Social Determinants of Health (SDOH) Interventions    Readmission Risk Interventions No flowsheet data found.

## 2021-05-22 NOTE — Progress Notes (Signed)
PROGRESS NOTE    Ann Flynn  TUU:[id number] DOB: 1941/08/13 DOA: 06/16/2021 PCP: Birdie Sons, MD   Brief Narrative:   80 y.o. female with medical history significant for diastolic CHF, moderate pulmonary hypertension, IDDM 2, CKD stage IV, OSA on CPAP, , a flutter s/p cardioversion on 4/22 on Eliquis and amiodarone, who presents to the ED for the second time in 3 days with complaints of neck pain and knee pain with difficulty bearing weight and difficulty moving around the house where she lives alone with a daughter that lives nearby.  She denies other complaints.  On her first visit to the ED on 10/1 she had an essentially negative work-up and was discharged with Voltaren gel and a Lidoderm patch for suspected neck strain.  She was brought back to the emergency room on 10/3 because family noted her to be increasingly weak, at this time not explained by her neck pain or knee pain.  She had no cough chest pain or shortness of breath, no nausea or vomiting, but family is concerned for her oral intake given decreasing ability to care for herself at home  10/5: Called to bedside by RN this morning.  Patient with increased work of breathing and increasingly somnolent.  Evaluated bedside.  Rhonchorous breath sounds.  Vital signs stable.  3 L..  Blood gas reassuring.   Assessment & Plan:   Principal Problem:   Acute kidney injury superimposed on CKD IV (HCC) Active Problems:   OSA on CPAP   Chronic pulmonary embolism, unspecified pulmonary embolism type, unspecified whether acute cor pulmonale present (HCC)   Morbid (severe) obesity due to excess calories (HCC)   Atrial fibrillation, chronic (HCC)   Diabetes mellitus, type II ([place])   Pulmonary hypertension (HCC)   Neck pain   Effusion of left knee   Hyponatremia   Anemia in chronic kidney disease (CKD)   Acute renal insufficiency  AKI on CKD stage IV Likely multifactorial Initially concern for ATN versus prerenal azotemia Now  likely element of cardiorenal syndrome Plan: Cautious use of diuretic Daily renal function Complete urinalysis Renal ultrasound Hold IV fluids for now We will involve nephrology should creatinine continue to worsen  Acute on chronic diastolic congestive heart failure Initially not overloaded Received IV fluid hydration Creatinine continue to worsen 8 Now suspect cardiorenal syndrome Plan: Cautious use of diuretic Hold home losartan Continue metoprolol Daily weights Strict intake and output  Neck pain Left knee effusion with ambulatory dysfunction Diagnostic x-rays negative for fracture Moderate effusion left knee Continue Dilaudid arm As needed pain control Avoid NSAIDs Therapy evaluations If pain persistent will consider orthopedic evaluation for arthrocentesis  Type 2 diabetes mellitus with hyperglycemia Semglee 10 units daily Moderate sliding scale  Chronic hypoxic respiratory failure Patient baseline 3 L oxygen Currently on this rate Increased work of breathing noted 10/5 Likely multifactorial etiology Plan: Solu-Medrol IV 120 mg daily Lasix 60 mg x 1, reassess daily Empiric Levaquin for CAP coverage Vitals per unit protocol  Depression PTA Effexor  Paroxysmal atrial fibrillation Rate controlled Continue Eliquis Continue metoprolol  OSA on nightly CPAP      DVT prophylaxis: Eliquis Code Status: Full Family Communication:  Disposition Plan: Status is: Inpatient  Remains inpatient appropriate because:Inpatient level of care appropriate due to severity of illness  Dispo: The patient is from: Home              Anticipated d/c is to: SNF  Patient currently is not medically stable to d/c.   Difficult to place patient No       Level of care: Med-Surg  Consultants:  None  Procedures:  None  Antimicrobials:  Levofloxacin   Subjective: Seen and examined.  Lethargic.  Increased work of breathing.  Mentating  clearly.  Objective: Vitals:   05/21/21 1550 05/21/21 1914 05/22/21 0254 05/22/21 0735  BP:  (!) 103/55 (!) 140/56 135/64  Pulse:  (!) 54 (!) 59 (!) 102  Resp:  17 (!) 21 20  Temp: 99.3 F (37.4 C) 97.8 F (36.6 C) 98.4 F (36.9 C) 98.4 F (36.9 C)  TempSrc: Oral  Oral Oral  SpO2:  98% 95% 97%  Weight:      Height:        Intake/Output Summary (Last 24 hours) at 05/22/2021 1212 Last data filed at 05/22/2021 1039 Gross per 24 hour  Intake 0 ml  Output 0 ml  Net 0 ml   Filed Weights   06/05/2021 2044 05/21/21 0603  Weight: 106.6 kg 111.1 kg    Examination:  General exam: Lethargic.  Increased work of breathing Respiratory system: Diffuse rhonchi.  Increased work of breathing.  3 L Cardiovascular system: S1-S2, regular rate, irregular rhythm, no murmurs Gastrointestinal system: Obese, NT/ND, normal bowel sounds Central nervous system: Lethargic but oriented.  No focal deficits Extremities: Diffusely decreased power bilaterally Skin: No rashes, lesions or ulcers Psychiatry: Judgement and insight appear impaired. Mood & affect flattened.     Data Reviewed: I have personally reviewed following labs and imaging studies  CBC: Recent Labs  Lab 05/18/21 1402 06/13/2021 2222 05/22/21 0523  WBC 10.5 18.0* 15.3*  NEUTROABS  --  16.4*  --   HGB 8.9* 9.8* 8.5*  HCT 29.3* 31.2* 27.3*  MCV 100.0 98.1 96.1  PLT 214 225 408   Basic Metabolic Panel: Recent Labs  Lab 05/18/21 1402 06/10/2021 2222 05/21/21 0340 05/22/21 0523  NA 138 133* 134* 130*  K 4.9 4.8 4.5 5.0  CL 103 98 102 98  CO2 _0 GLUCOSE 197* 261* 252* 221*  BUN 40* 73* 73* 87*  CREATININE 1.81* 2.95* 2.75* 2.98*  CALCIUM 8.6* 8.7* 7.9* 8.2*   GFR: Estimated Creatinine Clearance: 18.7 mL/min (A) (by C-G formula based on SCr of 2.98 mg/dL (H)). Liver Function Tests: Recent Labs  Lab 05/22/2021 2222  AST 20  ALT 13  ALKPHOS 75  BILITOT 1.3*  PROT 6.0*  ALBUMIN 3.0*   No results for input(s):  LIPASE, AMYLASE in the last 168 hours. No results for input(s): AMMONIA in the last 168 hours. Coagulation Profile: No results for input(s): INR, PROTIME in the last 168 hours. Cardiac Enzymes: Recent Labs  Lab 05/19/2021 2222  CKTOTAL 66   BNP (last 3 results) No results for input(s): PROBNP in the last 8760 hours. HbA1C: Recent Labs    05/21/21 0340  HGBA1C 7.9*   CBG: Recent Labs  Lab 05/21/21 1133 05/21/21 1643 05/21/21 2042 05/22/21 1153  GLUCAP 247* 209* 178* 260*   Lipid Profile: No results for input(s): CHOL, HDL, LDLCALC, TRIG, CHOLHDL, LDLDIRECT in the last 72 hours. Thyroid Function Tests: No results for input(s): TSH, T4TOTAL, FREET4, T3FREE, THYROIDAB in the last 72 hours. Anemia Panel: No results for input(s): VITAMINB12, FOLATE, FERRITIN, TIBC, IRON, RETICCTPCT in the last 72 hours. Sepsis Labs: No results for input(s): PROCALCITON, LATICACIDVEN in the last 168 hours.  Recent Results (from the past 240 hour(s))  Resp Panel by  RT-PCR (Flu A&B, Covid) Nasopharyngeal Swab     Status: None   Collection Time: 05/30/2021 10:22 PM   Specimen: Nasopharyngeal Swab; Nasopharyngeal(NP) swabs in vial transport medium  Result Value Ref Range Status   SARS Coronavirus 2 by RT PCR NEGATIVE NEGATIVE Final    Comment: (NOTE) SARS-CoV-2 target nucleic acids are NOT DETECTED.  The SARS-CoV-2 RNA is generally detectable in upper respiratory specimens during the acute phase of infection. The lowest concentration of SARS-CoV-2 viral copies this assay can detect is 138 copies/mL. A negative result does not preclude SARS-Cov-2 infection and should not be used as the sole basis for treatment or other patient management decisions. A negative result may occur with  improper specimen collection/handling, submission of specimen other than nasopharyngeal swab, presence of viral mutation(s) within the areas targeted by this assay, and inadequate number of viral copies(<138  copies/mL). A negative result must be combined with clinical observations, patient history, and epidemiological information. The expected result is Negative.  Fact Sheet for Patients:  EntrepreneurPulse.com.au  Fact Sheet for Healthcare Providers:  IncredibleEmployment.be  This test is no t yet approved or cleared by the [place] FDA and  has been authorized for detection and/or diagnosis of SARS-CoV-2 by FDA under an Emergency Use Authorization (EUA). This EUA will remain  in effect (meaning this test can be used) for the duration of the COVID-19 declaration under Section 564(b)(1) of the Act, 21 U.S.C.section 360bbb-3(b)(1), unless the authorization is terminated  or revoked sooner.       Influenza A by PCR NEGATIVE NEGATIVE Final   Influenza B by PCR NEGATIVE NEGATIVE Final    Comment: (NOTE) The Xpert Xpress SARS-CoV-2/FLU/RSV plus assay is intended as an aid in the diagnosis of influenza from Nasopharyngeal swab specimens and should not be used as a sole basis for treatment. Nasal washings and aspirates are unacceptable for Xpert Xpress SARS-CoV-2/FLU/RSV testing.  Fact Sheet for Patients: EntrepreneurPulse.com.au  Fact Sheet for Healthcare Providers: IncredibleEmployment.be  This test is not yet approved or cleared by the [place] FDA and has been authorized for detection and/or diagnosis of SARS-CoV-2 by FDA under an Emergency Use Authorization (EUA). This EUA will remain in effect (meaning this test can be used) for the duration of the COVID-19 declaration under Section 564(b)(1) of the Act, 21 U.S.C. section 360bbb-3(b)(1), unless the authorization is terminated or revoked.  Performed at [facility], [address] [place], [place] [address]          Radiology Studies: DG Cervical Spine Complete  Result Date: 05/21/2021 CLINICAL DATA:  Persistent pain EXAM:  CERVICAL SPINE - COMPLETE 4+ VIEW COMPARISON:  CT 03/23/2020 FINDINGS: Inadequate visualization C7 and cervicothoracic junction. Trace anterolisthesis C3 on C4, C4 on C5 and C5 on C6. Vertebral body heights are maintained. Mild disc space narrowing C5-C6. Dens and lateral masses are within normal limits. IMPRESSION: Trace anterolisthesis C3 through C6 with mild degenerative change at C5-C6. C7 and cervicothoracic junction are poorly visible Electronically Signed   By: Donavan Foil M.D.   On: 06/05/2021 21:18   DG Chest Port 1 View  Result Date: 05/22/2021 CLINICAL DATA:  80 year old female with history of shortness of breath. EXAM: PORTABLE CHEST 1 VIEW COMPARISON:  Chest x-ray 05/21/2021. FINDINGS: Lung volumes are normal. No consolidative airspace disease. No pleural effusions. No pneumothorax. Several tiny calcified granulomas are noted in the lungs. No larger more suspicious appearing pulmonary nodules or masses are noted. Cephalization of the pulmonary vasculature, without frank pulmonary edema.  Dilatation of the central pulmonary arteries. Mild cardiomegaly. Upper mediastinal contours are within normal limits. Atherosclerotic calcifications in the thoracic aorta. IMPRESSION: 1. Cardiomegaly with pulmonary venous congestion, but no frank pulmonary edema. 2. Dilatation of the central pulmonary arteries, concerning for pulmonary arterial hypertension. 3. Aortic atherosclerosis. Electronically Signed   By: Vinnie Langton M.D.   On: 05/22/2021 05:03   DG Chest Port 1 View  Result Date: 05/21/2021 CLINICAL DATA:  Tachypnea EXAM: PORTABLE CHEST 1 VIEW COMPARISON:  05/18/2021 FINDINGS: Stable mild to moderate enlargement of the cardiopericardial silhouette. Mildly indistinct pulmonary vasculature may represent pulmonary venous hypertension. No overt pulmonary edema. No Kerley B lines are observed. No blunting of the costophrenic angles. Atherosclerotic calcification of the aortic arch. Suspected calcified  granuloma peripherally at the right lung base. Thoracic spondylosis. IMPRESSION: 1. Mild to moderate enlargement of the cardiopericardial silhouette with potential pulmonary venous hypertension but no overt edema. 2.  Aortic Atherosclerosis (ICD10-I70.0). Electronically Signed   By: Van Clines M.D.   On: 05/21/2021 14:42   DG Knee Complete 4 Views Left  Result Date: 06/10/2021 CLINICAL DATA:  Left knee pain. EXAM: LEFT KNEE - COMPLETE 4+ VIEW COMPARISON:  None. FINDINGS: No evidence of acute fracture or dislocation. Moderate severity medial tibiofemoral compartment space narrowing is seen. A moderate sized joint effusion is noted. IMPRESSION: 1. Moderate sized joint effusion without evidence of acute fracture. Electronically Signed   By: Virgina Norfolk M.D.   On: 06/04/2021 21:20   DG Hip Unilat W or Wo Pelvis 2-3 Views Left  Result Date: 06/14/2021 CLINICAL DATA:  Left hip pain EXAM: DG HIP (WITH OR WITHOUT PELVIS) 2-3V LEFT COMPARISON:  None. FINDINGS: SI joints are non widened. Pubic symphysis and rami appear intact. Mild degenerative changes of the right hip. No fracture or malalignment on the left. IMPRESSION: No acute osseous abnormality Electronically Signed   By: Donavan Foil M.D.   On: 06/07/2021 22:16        Scheduled Meds:  ambrisentan  5 mg Oral Daily   apixaban  2.5 mg Oral BID   influenza vaccine adjuvanted  0.5 mL Intramuscular Tomorrow-1000   insulin aspart  0-15 Units Subcutaneous TID WC   insulin aspart  0-5 Units Subcutaneous QHS   insulin glargine-yfgn  10 Units Subcutaneous Daily   mouth rinse  15 mL Mouth Rinse BID   methylPREDNISolone (SOLU-MEDROL) injection  120 mg Intravenous Q24H   venlafaxine  75 mg Oral Daily   Continuous Infusions:  levofloxacin (LEVAQUIN) IV 750 mg (05/22/21 1134)   Followed by   Derrill Memo ON 05/24/2021] levofloxacin (LEVAQUIN) IV       LOS: 1 day    Time spent: 35 minutes    Sidney Ace, MD [facility] Pager  [phone]  If 7PM-7AM, please contact night-coverage 05/22/2021, 12:12 PM

## 2021-05-22 NOTE — Progress Notes (Signed)
Patient with wet lung sounds, labored breathing since start of shift, congested productive cough. Unable to completely expel phlegm due to weak cough. Patient also complained of severe headache and was given morphine around 2136, she did fall asleep for about 3 hours. Nurse notified MD via secure chat with no new orders given. Morphine did seem to help calm breathing as well noted. Upon rounding close to 0300 patient noted to be having labored breathing again and unable to complete a sentence without taking a breath between each word.

## 2021-05-22 NOTE — Consult Note (Signed)
Pharmacy Antibiotic Note  Ann Flynn is a 80 y.o. female admitted on 05/22/2021 with pneumonia.  Pharmacy has been consulted for levofloxacin dosing. Pt with a Tmax of 101.8, currently on [place] 3 L/min. With a WBC trending down 18 > 15.3. Pt has a noted PCN allergy reaction of swelling/hives. No note of beta-lactam usage on chart. COVID/flu negative.   CXR: 1. Cardiomegaly with pulmonary venous congestion, but no frank pulmonary edema.2. Dilatation of the central pulmonary arteries, concerning for pulmonary arterial hypertension.  Plan: Will start levofloxacin 750 mg x 1 followed by 500 mg q48H x total of 5 days. Azithromycin was d/c'ed due to duplicate atypical coverage. Pt currently with AKI with Scr trending up. Continue monitor CrCl and adjust dose as needed.   Height: 5' 5" (165.1 cm) Weight: 111.1 kg (244 lb 14.9 oz) IBW/kg (Calculated) : 57  Temp (24hrs), Avg:99.1 F (37.3 C), Min:97.8 F (36.6 C), Max:101.8 F (38.8 C)  Recent Labs  Lab 05/18/21 1402 05/25/2021 2222 05/21/21 0340 05/22/21 0523  WBC 10.5 18.0*  --  15.3*  CREATININE 1.81* 2.95* 2.75* 2.98*    Estimated Creatinine Clearance: 18.7 mL/min (A) (by C-G formula based on SCr of 2.98 mg/dL (H)).    Allergies  Allergen Reactions   Penicillins Swelling    Facial swelling Did it involve swelling of the face/tongue/throat, SOB, or low BP? Yes Did it involve sudden or severe rash/hives, skin peeling, or any reaction on the inside of your mouth or nose? No Did you need to seek medical attention at a hospital or doctor's office? No When did it last happen?      10 + years If all above answers are "NO", may proceed with cephalosporin use.    Sulfa Antibiotics Rash   Uptravi [Selexipag] Other (See Comments)    headaches    Antimicrobials this admission: 10/5 levofloxacin >>   Dose adjustments this admission: None   Microbiology results: None  Thank you for allowing pharmacy to be a part of this patient's  care.  Oswald Hillock, PharmD, BCPS 05/22/2021 11:42 AM

## 2021-05-22 NOTE — Progress Notes (Signed)
PHARMACY NOTE:  methylprednisolone frequency adjustment  As per policy approved by the Pharmacy & Therapeutics and Medical Executive Committees, the IV methylprednisolone will be converted to either a q12h or q24h frequency with the same total daily dose (TDD).   Ordered Dose: 1 to 125 mg TDD; convert to: TDD q24h.  Ordered Dose: 126 to 250 mg TDD; convert to: TDD div q12h.  Ordered Dose: >250 mg TDD; DAW.  Current methylprednisolone dosage:  40 mg IV every 8 hours    Antimicrobial methylprednisolone has been changed to:  120 mg IV every 24 hours   Thank you for allowing pharmacy to be a part of this patient's care.  Dallie Piles, [facility] 05/22/2021 8:07 AM

## 2021-05-22 NOTE — Progress Notes (Signed)
Inpatient Diabetes Program Recommendations  AACE/ADA: New Consensus Statement on Inpatient Glycemic Control   Target Ranges:  Prepandial:   less than 140 mg/dL      Peak postprandial:   less than 180 mg/dL (1-2 hours)      Critically ill patients:  140 - 180 mg/dL  Results for Ann Flynn, Ann Flynn (MRN [id number]) as of 05/22/2021 11:13  Ref. Range 05/22/2021 05:23  Glucose Latest Ref Range: 70 - 99 mg/dL 221 (H)   Results for Ann Flynn, Ann Flynn (MRN [id number]) as of 05/22/2021 11:13  Ref. Range 05/21/2021 11:33 05/21/2021 16:43 05/21/2021 20:42  Glucose-Capillary Latest Ref Range: 70 - 99 mg/dL 247 (H) 209 (H) 178 (H)    Review of Glycemic Control  Diabetes history: DM2 Outpatient Diabetes medications: Toujeo 30 units daily, Trulicity 3 mg Qweek Current orders for Inpatient glycemic control: Novolog 0-15 units TID with meals, Novolog 0-5 units QHS; Solumedrol 120 mg Q24H  Inpatient Diabetes Program Recommendations:    Insulin: Please consider ordering Semglee 10 units Q24H.  Thanks, Barnie Alderman, RN, MSN, CDE Diabetes Coordinator Inpatient Diabetes Program [phone] (Team Pager from 8am to 5pm)

## 2021-05-22 NOTE — Plan of Care (Signed)

## 2021-05-22 NOTE — Progress Notes (Signed)
Dr. Priscella Mann contacted and notified of patient condition, interventions through the night and that she currently still continues with same symptoms. He will round on her first this am.

## 2021-05-22 NOTE — Progress Notes (Signed)
Mobility Specialist - Progress Note   05/22/21 1600  Mobility  Range of Motion/Exercises Passive  Level of Assistance Maximum assist, patient does 25-49%  Assistive Device None  Distance Ambulated (ft) 0 ft  Mobility performed by Mobility specialist  $Mobility charge 1 Mobility    Pt lying in bed upon arrival utilizing 3L. Pt spilled water on gown, assistance for gown change with maxA. Pt does become a little anxious with movement, calming technique used. Further activity limited d/t transport entering to send pt for [place].   Kathee Delton Mobility Specialist 05/22/21, 4:25 PM

## 2021-05-22 NOTE — Progress Notes (Signed)
[facility]  ROUNDING NOTE   Subjective:   Ann Flynn was admitted to [facility] on 06/10/2021 for Weakness [R53.1] AKI (acute kidney injury) [facility]) [N17.9] Acute pain of left knee [M25.562] Acute renal insufficiency [N28.9]  Patient was last seen in the office by my partner, Dr. Candiss Norse, on 9/15. Her creatinine was 1.83 with GFR of 28. Patient she was in her usual state of health at that time. She was compliant and taking all her medications.   Patient was originally seen in the ED on 10/1 where she was given topical treatments for her neck strain and knee pain. She states these only helped a little. Patient returned on 10/3 with hypotension, acute kidney injury and leukocytosis. She was then started on IV fluids. No contrast exposure.   Nephrology consulted for acute kidney injury with anuria. Patient also with hyponatremia, hyperkalemia and anemia   Objective:  Vital signs in last 24 hours:  Temp:  [97.8 F (36.6 C)-101.8 F (38.8 C)] 98.4 F (36.9 C) (10/05 0735) Pulse Rate:  [54-102] 102 (10/05 0735) Resp:  [17-21] 20 (10/05 0735) BP: (103-140)/(50-64) 135/64 (10/05 0735) SpO2:  [95 %-98 %] 97 % (10/05 0735)  Weight change:  Filed Weights   06/07/2021 2044 05/21/21 0603  Weight: 106.6 kg 111.1 kg    Intake/Output: I/O last 3 completed shifts: In: 500 [IV Piggyback:500] Out: 0    Intake/Output this shift:  Total I/O In: 0  Out: 1410 [Urine:1410]  Physical Exam: General: NAD, laying in bed  Head: Pursed lips  Eyes: Anicteric, PERRL  Neck: Supple, trachea midline  Lungs:  Diminished bilaterally, crackles on examination  Heart: Regular rate and rhythm  Abdomen:  Soft, nontender,   Extremities:  1+ peripheral edema.  Neurologic: Nonfocal, able to answer questions  Skin: No lesions        Basic Metabolic Panel: Recent Labs  Lab 05/18/21 1402 06/12/2021 2222 05/21/21 0340 05/22/21 0523  NA 138 133* 134* 130*  K 4.9 4.8 4.5 5.0  CL 103 98 102  98  CO2 _0 GLUCOSE 197* 261* 252* 221*  BUN 40* 73* 73* 87*  CREATININE 1.81* 2.95* 2.75* 2.98*  CALCIUM 8.6* 8.7* 7.9* 8.2*    Liver Function Tests: Recent Labs  Lab 06/15/2021 2222 05/22/21 0523  AST 20 18  ALT 13 10  ALKPHOS 75 69  BILITOT 1.3* 1.3*  PROT 6.0* 5.6*  ALBUMIN 3.0* 2.2*   No results for input(s): LIPASE, AMYLASE in the last 168 hours. No results for input(s): AMMONIA in the last 168 hours.  CBC: Recent Labs  Lab 05/18/21 1402 05/22/2021 2222 05/22/21 0523  WBC 10.5 18.0* 15.3*  NEUTROABS  --  16.4*  --   HGB 8.9* 9.8* 8.5*  HCT 29.3* 31.2* 27.3*  MCV 100.0 98.1 96.1  PLT 214 225 224    Cardiac Enzymes: Recent Labs  Lab 06/16/2021 2222  CKTOTAL 66    BNP: Invalid input(s): POCBNP  CBG: Recent Labs  Lab 05/21/21 1133 05/21/21 1643 05/21/21 2042 05/22/21 1153  GLUCAP 247* 209* 178* 260*    Microbiology: Results for orders placed or performed during the hospital encounter of 05/21/2021  Resp Panel by RT-PCR (Flu A&B, Covid) Nasopharyngeal Swab     Status: None   Collection Time: 05/25/2021 10:22 PM   Specimen: Nasopharyngeal Swab; Nasopharyngeal(NP) swabs in vial transport medium  Result Value Ref Range Status   SARS Coronavirus 2 by RT PCR NEGATIVE NEGATIVE Final  Comment: (NOTE) SARS-CoV-2 target nucleic acids are NOT DETECTED.  The SARS-CoV-2 RNA is generally detectable in upper respiratory specimens during the acute phase of infection. The lowest concentration of SARS-CoV-2 viral copies this assay can detect is 138 copies/mL. A negative result does not preclude SARS-Cov-2 infection and should not be used as the sole basis for treatment or other patient management decisions. A negative result may occur with  improper specimen collection/handling, submission of specimen other than nasopharyngeal swab, presence of viral mutation(s) within the areas targeted by this assay, and inadequate number of viral copies(<138 copies/mL).  A negative result must be combined with clinical observations, patient history, and epidemiological information. The expected result is Negative.  Fact Sheet for Patients:  EntrepreneurPulse.com.au  Fact Sheet for Healthcare Providers:  IncredibleEmployment.be  This test is no t yet approved or cleared by the [place] FDA and  has been authorized for detection and/or diagnosis of SARS-CoV-2 by FDA under an Emergency Use Authorization (EUA). This EUA will remain  in effect (meaning this test can be used) for the duration of the COVID-19 declaration under Section 564(b)(1) of the Act, 21 U.S.C.section 360bbb-3(b)(1), unless the authorization is terminated  or revoked sooner.       Influenza A by PCR NEGATIVE NEGATIVE Final   Influenza B by PCR NEGATIVE NEGATIVE Final    Comment: (NOTE) The Xpert Xpress SARS-CoV-2/FLU/RSV plus assay is intended as an aid in the diagnosis of influenza from Nasopharyngeal swab specimens and should not be used as a sole basis for treatment. Nasal washings and aspirates are unacceptable for Xpert Xpress SARS-CoV-2/FLU/RSV testing.  Fact Sheet for Patients: EntrepreneurPulse.com.au  Fact Sheet for Healthcare Providers: IncredibleEmployment.be  This test is not yet approved or cleared by the [place] FDA and has been authorized for detection and/or diagnosis of SARS-CoV-2 by FDA under an Emergency Use Authorization (EUA). This EUA will remain in effect (meaning this test can be used) for the duration of the COVID-19 declaration under Section 564(b)(1) of the Act, 21 U.S.C. section 360bbb-3(b)(1), unless the authorization is terminated or revoked.  Performed at [facility], [address] [place], [place] [address]     Coagulation Studies: No results for input(s): LABPROT, INR in the last 72 hours.  Urinalysis: Recent Labs    05/22/21 1215   COLORURINE YELLOW*  LABSPEC 1.014  PHURINE 5.0  GLUCOSEU NEGATIVE  HGBUR LARGE*  BILIRUBINUR NEGATIVE  KETONESUR NEGATIVE  PROTEINUR 100*  NITRITE NEGATIVE  LEUKOCYTESUR MODERATE*      Imaging: DG Cervical Spine Complete  Result Date: 06/07/2021 CLINICAL DATA:  Persistent pain EXAM: CERVICAL SPINE - COMPLETE 4+ VIEW COMPARISON:  CT 03/23/2020 FINDINGS: Inadequate visualization C7 and cervicothoracic junction. Trace anterolisthesis C3 on C4, C4 on C5 and C5 on C6. Vertebral body heights are maintained. Mild disc space narrowing C5-C6. Dens and lateral masses are within normal limits. IMPRESSION: Trace anterolisthesis C3 through C6 with mild degenerative change at C5-C6. C7 and cervicothoracic junction are poorly visible Electronically Signed   By: Donavan Foil M.D.   On: 06/02/2021 21:18   DG Chest Port 1 View  Result Date: 05/22/2021 CLINICAL DATA:  80 year old female with history of shortness of breath. EXAM: PORTABLE CHEST 1 VIEW COMPARISON:  Chest x-ray 05/21/2021. FINDINGS: Lung volumes are normal. No consolidative airspace disease. No pleural effusions. No pneumothorax. Several tiny calcified granulomas are noted in the lungs. No larger more suspicious appearing pulmonary nodules or masses are noted. Cephalization of the pulmonary vasculature, without frank pulmonary  edema. Dilatation of the central pulmonary arteries. Mild cardiomegaly. Upper mediastinal contours are within normal limits. Atherosclerotic calcifications in the thoracic aorta. IMPRESSION: 1. Cardiomegaly with pulmonary venous congestion, but no frank pulmonary edema. 2. Dilatation of the central pulmonary arteries, concerning for pulmonary arterial hypertension. 3. Aortic atherosclerosis. Electronically Signed   By: Vinnie Langton M.D.   On: 05/22/2021 05:03   DG Chest Port 1 View  Result Date: 05/21/2021 CLINICAL DATA:  Tachypnea EXAM: PORTABLE CHEST 1 VIEW COMPARISON:  05/18/2021 FINDINGS: Stable mild to moderate  enlargement of the cardiopericardial silhouette. Mildly indistinct pulmonary vasculature may represent pulmonary venous hypertension. No overt pulmonary edema. No Kerley B lines are observed. No blunting of the costophrenic angles. Atherosclerotic calcification of the aortic arch. Suspected calcified granuloma peripherally at the right lung base. Thoracic spondylosis. IMPRESSION: 1. Mild to moderate enlargement of the cardiopericardial silhouette with potential pulmonary venous hypertension but no overt edema. 2.  Aortic Atherosclerosis (ICD10-I70.0). Electronically Signed   By: Van Clines M.D.   On: 05/21/2021 14:42   DG Knee Complete 4 Views Left  Result Date: 06/16/2021 CLINICAL DATA:  Left knee pain. EXAM: LEFT KNEE - COMPLETE 4+ VIEW COMPARISON:  None. FINDINGS: No evidence of acute fracture or dislocation. Moderate severity medial tibiofemoral compartment space narrowing is seen. A moderate sized joint effusion is noted. IMPRESSION: 1. Moderate sized joint effusion without evidence of acute fracture. Electronically Signed   By: Virgina Norfolk M.D.   On: 06/05/2021 21:20   DG Hip Unilat W or Wo Pelvis 2-3 Views Left  Result Date: 05/21/2021 CLINICAL DATA:  Left hip pain EXAM: DG HIP (WITH OR WITHOUT PELVIS) 2-3V LEFT COMPARISON:  None. FINDINGS: SI joints are non widened. Pubic symphysis and rami appear intact. Mild degenerative changes of the right hip. No fracture or malalignment on the left. IMPRESSION: No acute osseous abnormality Electronically Signed   By: Donavan Foil M.D.   On: 05/19/2021 22:16     Medications:    [START ON 05/24/2021] levofloxacin (LEVAQUIN) IV      ambrisentan  5 mg Oral Daily   apixaban  2.5 mg Oral BID   influenza vaccine adjuvanted  0.5 mL Intramuscular Tomorrow-1000   insulin aspart  0-15 Units Subcutaneous TID WC   insulin aspart  0-5 Units Subcutaneous QHS   insulin glargine-yfgn  10 Units Subcutaneous Daily   mouth rinse  15 mL Mouth Rinse BID    methylPREDNISolone (SOLU-MEDROL) injection  120 mg Intravenous Q24H   venlafaxine  75 mg Oral Daily   acetaminophen **OR** acetaminophen, morphine injection, ondansetron **OR** ondansetron (ZOFRAN) IV  Assessment/ Plan:  [name] is a 80 y.o. white female with diabetes mellitus type II insulin dependent, atrial fibrillation, diastolic congestive heart failure, pulmonary hypertension, sleep apnea, history of PE, who is admitted to [facility] on 06/04/2021 for Weakness [R53.1] AKI (acute kidney injury) ([place]) [N17.9] Acute pain of left knee [M25.562] Acute renal insufficiency [N28.9]  Acute kidney injury on chronic kidney disease stage IV with proteinuria: baseline creatinine 1.83, GFR of 28 on 05/01/2021. Chronic kidney disease secondary to diabetic nephropathy. Acute kidney injury secondary to obstructive uropathy and acute cardiorenal syndrome. No IV contrast exposure.  - After foley catheter placement, patient released 143m of urine.  - pending renal ultrasound  - holding losartan.   Hypertension: 135/64. Currently holding home regimen of losartan.   Acute exacerbation of diastolic congestive heart failure:  - consider new echocardiogram.  - Continue IV furosemide. 665mIV q12  Anemia with chronic kidney disease: normocytic, hemoglobin 8.5. Labs consistent with iron deficiency on 7/25.  - check iron studies, anemia studies and SPEP/UPEP.   Diabetes mellitus type II with chronic kidney disease: insulin dependent: hemoglobin A1c of 7.9%.  - continue glucose control.    LOS: 1 Calleigh Lafontant 10/5/20222:05 PM

## 2021-05-22 NOTE — Progress Notes (Signed)
Dr. Onalee Hua called and is putting in order for chest xray for patient and will follow-up post xray results. Patient has been given another dose of morphine due to c/o pain to right shoulder and headache. Will continue to monitor patient.

## 2021-05-23 ENCOUNTER — Inpatient Hospital Stay
Admit: 2021-05-23 | Discharge: 2021-05-23 | Disposition: A | Discharge status: Home / Self Care | Payer: Medicare Other | Attending: Nephrology | Admitting: Nephrology

## 2021-05-23 DIAGNOSIS — N189 Chronic kidney disease, unspecified: Secondary | ICD-10-CM | POA: Diagnosis not present

## 2021-05-23 DIAGNOSIS — N179 Acute kidney failure, unspecified: Secondary | ICD-10-CM | POA: Diagnosis not present

## 2021-05-23 LAB — CBC WITH DIFFERENTIAL/PLATELET
Abs Immature Granulocytes: 0.15 10*3/uL — ABNORMAL HIGH (ref 0.00–0.07)
Basophils Absolute: 0 10*3/uL (ref 0.0–0.1)
Basophils Relative: 0 %
Eosinophils Absolute: 0 10*3/uL (ref 0.0–0.5)
Eosinophils Relative: 0 %
HCT: 28.4 % — ABNORMAL LOW (ref 36.0–46.0)
Hemoglobin: 9.3 g/dL — ABNORMAL LOW (ref 12.0–15.0)
Immature Granulocytes: 1 %
Lymphocytes Relative: 2 %
Lymphs Abs: 0.3 10*3/uL — ABNORMAL LOW (ref 0.7–4.0)
MCH: 30.8 pg (ref 26.0–34.0)
MCHC: 32.7 g/dL (ref 30.0–36.0)
MCV: 94 fL (ref 80.0–100.0)
Monocytes Absolute: 0.9 10*3/uL (ref 0.1–1.0)
Monocytes Relative: 6 %
Neutro Abs: 13.4 10*3/uL — ABNORMAL HIGH (ref 1.7–7.7)
Neutrophils Relative %: 91 %
Platelets: 231 10*3/uL (ref 150–400)
RBC: 3.02 MIL/uL — ABNORMAL LOW (ref 3.87–5.11)
RDW: 15.2 % (ref 11.5–15.5)
WBC: 14.7 10*3/uL — ABNORMAL HIGH (ref 4.0–10.5)
nRBC: 0 % (ref 0.0–0.2)

## 2021-05-23 LAB — BASIC METABOLIC PANEL
Anion gap: 12 (ref 5–15)
BUN: 111 mg/dL — ABNORMAL HIGH (ref 8–23)
CO2: 23 mmol/L (ref 22–32)
Calcium: 8.6 mg/dL — ABNORMAL LOW (ref 8.9–10.3)
Chloride: 94 mmol/L — ABNORMAL LOW (ref 98–111)
Creatinine, Ser: 2.96 mg/dL — ABNORMAL HIGH (ref 0.44–1.00)
GFR, Estimated: 15 mL/min — ABNORMAL LOW (ref >60)
Glucose, Bld: 317 mg/dL — ABNORMAL HIGH (ref 70–99)
Potassium: 4.9 mmol/L (ref 3.5–5.1)
Sodium: 129 mmol/L — ABNORMAL LOW (ref 135–145)

## 2021-05-23 LAB — ECHOCARDIOGRAM COMPLETE
AR max vel: 1.54 cm2
AV Area VTI: 1.85 cm2
AV Area mean vel: 1.46 cm2
AV Mean grad: 8 mmHg
AV Peak grad: 13.7 mmHg
Ao pk vel: 1.85 m/s
Area-P 1/2: 2.85 cm2
Height: 65 in
MV VTI: 1.92 cm2
S' Lateral: 3.4 cm
Weight: 3918.9 oz

## 2021-05-23 LAB — GLUCOSE, CAPILLARY
Glucose-Capillary: 286 mg/dL — ABNORMAL HIGH (ref 70–99)
Glucose-Capillary: 319 mg/dL — ABNORMAL HIGH (ref 70–99)
Glucose-Capillary: 329 mg/dL — ABNORMAL HIGH (ref 70–99)
Glucose-Capillary: 292 mg/dL — ABNORMAL HIGH (ref 70–99)

## 2021-05-23 MED ORDER — METHYLPREDNISOLONE SODIUM SUCC 125 MG IJ SOLR
60.0000 mg | Freq: Once | INTRAMUSCULAR | Status: AC
  Administered 2021-05-23: 60 mg via INTRAVENOUS
  Filled 2021-05-23: qty 2

## 2021-05-23 MED ORDER — ALPRAZOLAM 0.25 MG PO TABS
0.2500 mg | ORAL_TABLET | Freq: Three times a day (TID) | ORAL | Status: DC | PRN
  Administered 2021-05-23 – 2021-05-25 (×4): 0.25 mg via ORAL
  Filled 2021-05-23 (×4): qty 1

## 2021-05-23 MED ORDER — ALBUTEROL SULFATE (2.5 MG/3ML) 0.083% IN NEBU: 2.5000 mg | INHALATION_SOLUTION | Freq: Four times a day (QID) | RESPIRATORY_TRACT | Status: DC | PRN

## 2021-05-23 MED ORDER — PANTOPRAZOLE SODIUM 40 MG PO TBEC
40.0000 mg | DELAYED_RELEASE_TABLET | Freq: Every day | ORAL | Status: DC
  Administered 2021-05-23 – 2021-05-25 (×3): 40 mg via ORAL
  Filled 2021-05-23 (×4): qty 1

## 2021-05-23 MED ORDER — METHOCARBAMOL 500 MG PO TABS
500.0000 mg | ORAL_TABLET | Freq: Three times a day (TID) | ORAL | Status: DC
  Administered 2021-05-23 (×2): 500 mg via ORAL
  Filled 2021-05-23 (×2): qty 1

## 2021-05-23 MED ORDER — ALBUTEROL SULFATE (2.5 MG/3ML) 0.083% IN NEBU
INHALATION_SOLUTION | RESPIRATORY_TRACT | Status: AC
  Administered 2021-05-23: 2.5 mg via RESPIRATORY_TRACT
  Filled 2021-05-23: qty 3

## 2021-05-23 MED ORDER — INSULIN GLARGINE-YFGN 100 UNIT/ML ~~LOC~~ SOLN
14.0000 [IU] | Freq: Every day | SUBCUTANEOUS | Status: DC
  Administered 2021-05-23: 14 [IU] via SUBCUTANEOUS
  Filled 2021-05-23 (×2): qty 0.14

## 2021-05-23 MED ORDER — METOPROLOL SUCCINATE ER 25 MG PO TB24
12.5000 mg | ORAL_TABLET | Freq: Every day | ORAL | Status: DC
  Administered 2021-05-23: 12.5 mg via ORAL
  Filled 2021-05-23: qty 1

## 2021-05-23 MED ORDER — INSULIN ASPART 100 UNIT/ML IJ SOLN
3.0000 [IU] | Freq: Three times a day (TID) | INTRAMUSCULAR | Status: DC
  Administered 2021-05-23: 3 [IU] via SUBCUTANEOUS
  Filled 2021-05-23 (×2): qty 1

## 2021-05-23 MED ORDER — METOPROLOL SUCCINATE ER 25 MG PO TB24
12.5000 mg | ORAL_TABLET | Freq: Every morning | ORAL | Status: DC
  Administered 2021-05-24 – 2021-05-25 (×2): 12.5 mg via ORAL
  Filled 2021-05-23 (×3): qty 1
  Filled 2021-05-23: qty 0.5

## 2021-05-23 MED ORDER — TADALAFIL (PAH) 20 MG PO TABS: 40.0000 mg | ORAL_TABLET | Freq: Every day | ORAL | Status: DC

## 2021-05-23 MED ORDER — VENLAFAXINE HCL 37.5 MG PO TABS
75.0000 mg | ORAL_TABLET | Freq: Every morning | ORAL | Status: DC
  Administered 2021-05-24 – 2021-05-25 (×2): 75 mg via ORAL
  Filled 2021-05-23 (×3): qty 2

## 2021-05-23 NOTE — TOC Progression Note (Signed)
Transition of Care [facility]) - Progression Note    Patient Details  Name: Ann Flynn MRN: [id number] Date of Birth: [birth year]  [facility] CM/SW Contact  Beverly Sessions, RN Phone Number: 05/23/2021, 1:15 PM  Clinical Narrative:    Patient not medically ready to start auth    Expected Discharge Plan: [place] Barriers to Discharge: Continued Medical Work up  Expected Discharge Plan and Services Expected Discharge Plan: [place] Choice: [place] arrangements for the past 2 months: Single Family Home                                       Social Determinants of Health (SDOH) Interventions    Readmission Risk Interventions No flowsheet data found.

## 2021-05-23 NOTE — Progress Notes (Signed)
PROGRESS NOTE    Ann Flynn  YIF:[id number] DOB: [birth year] DOA: 06/11/2021 PCP: Birdie Sons, MD   Brief Narrative:   80 y.o. female with medical history significant for diastolic CHF, moderate pulmonary hypertension, IDDM 2, CKD stage IV, OSA on CPAP, , a flutter s/p cardioversion on 4/22 on Eliquis and amiodarone, who presents to the ED for the second time in 3 days with complaints of neck pain and knee pain with difficulty bearing weight and difficulty moving around the house where she lives alone with a daughter that lives nearby.  She denies other complaints.  On her first visit to the ED on 10/1 she had an essentially negative work-up and was discharged with Voltaren gel and a Lidoderm patch for suspected neck strain.  She was brought back to the emergency room on 10/3 because family noted her to be increasingly weak, at this time not explained by her neck pain or knee pain.  She had no cough chest pain or shortness of breath, no nausea or vomiting, but family is concerned for her oral intake given decreasing ability to care for herself at home  10/5: Called to bedside by RN this morning.  Patient with increased work of breathing and increasingly somnolent.  Evaluated bedside.  Rhonchorous breath sounds.  Vital signs stable.  3 L..  Blood gas reassuring.  10/6: Foley placed yesterday.  1400 cc urine liberated.  Patient net -1500cc.  Respiratory status remains tenuous.  Mentation improved   Assessment & Plan:   Principal Problem:   Acute kidney injury superimposed on CKD IV (HCC) Active Problems:   OSA on CPAP   Chronic pulmonary embolism, unspecified pulmonary embolism type, unspecified whether acute cor pulmonale present (HCC)   Morbid (severe) obesity due to excess calories (HCC)   Atrial fibrillation, chronic (HCC)   Diabetes mellitus, type II ([place])   Pulmonary hypertension (HCC)   Neck pain   Effusion of left knee   Hyponatremia   Anemia in chronic kidney disease  (CKD)   Acute renal insufficiency  AKI on CKD stage IV Likely multifactorial Initially concern for ATN versus prerenal azotemia Now likely element of cardiorenal syndrome Renal US, cortical atrophy without hydronephrosis Plan: Lasix 60 mg IV twice daily Daily renal function Nephrology follow-up  Acute on chronic diastolic congestive heart failure Initially not overloaded Received IV fluid hydration Creatinine continue to worsen Now suspect cardiorenal syndrome Plan: Diuretics as above Daily weights Continue beta-blocker Hold ACE inhibitor  Neck pain Left knee effusion with ambulatory dysfunction Diagnostic x-rays negative for fracture Moderate effusion left knee Plan: Schedule muscle relaxants Continue Dilaudid prn for breakthrough As needed pain control Avoid NSAIDs Therapy evaluations If pain persistent will consider orthopedic evaluation  Type 2 diabetes mellitus with hyperglycemia Semglee 14 units daily NovoLog 3 units 3 times daily with meals Moderate sliding scale  Chronic hypoxic respiratory failure Patient baseline 3 L oxygen Currently on this rate Increased work of breathing noted 10/5 Likely multifactorial etiology Plan: Solu-Medrol 60 mg IV x1, reassess daily Lasix 60 mg IV twice daily Empiric Levaquin for CAP coverage, 5-day course Vitals per unit protocol  Depression PTA Effexor  Paroxysmal atrial fibrillation Rate controlled Continue Eliquis Continue metoprolol  OSA on nightly CPAP      DVT prophylaxis: Eliquis Code Status: Full Family Communication: None today Disposition Plan: Status is: Inpatient  Remains inpatient appropriate because:Inpatient level of care appropriate due to severity of illness  Dispo: The patient is from: Home  Anticipated d/c is to: SNF              Patient currently is not medically stable to d/c.   Difficult to place patient No       Level of care: Med-Surg  Consultants:   None  Procedures:  None  Antimicrobials:  Levofloxacin   Subjective: Seen and examined.  Still with increased work of breathing though improved.  Remains lethargic.  Mental status improved.  Objective: Vitals:   05/23/21 0412 05/23/21 0802 05/23/21 1005 05/23/21 1031  BP: (!) 151/58 (!) 138/58 128/71   Pulse: 92 64 89   Resp: 20  (!) 22   Temp: 97.6 F (36.4 C) 98.1 F (36.7 C) 97.8 F (36.6 C)   TempSrc: Oral Oral Oral   SpO2: 95% 94% 94% 95%  Weight:      Height:        Intake/Output Summary (Last 24 hours) at 05/23/2021 1118 Last data filed at 05/23/2021 1045 Gross per 24 hour  Intake 580 ml  Output 2650 ml  Net -2070 ml   Filed Weights   05/31/2021 2044 05/21/21 0603  Weight: 106.6 kg 111.1 kg    Examination:  General exam: Appears lethargic.  Fatigued Respiratory system: Scattered crackles.  Increased work of breathing.  3 L Cardiovascular system: S1-S2, regular rate, irregular rhythm, no murmurs Gastrointestinal system: Obese, NT/ND, normal bowel sounds Central nervous system: Fatigue.  Oriented.  No focal deficits Extremities: Diffusely decreased power bilaterally Skin: No rashes, lesions or ulcers Psychiatry: Judgement and insight appear impaired. Mood & affect flattened.     Data Reviewed: I have personally reviewed following labs and imaging studies  CBC: Recent Labs  Lab 05/18/21 1402 05/30/2021 2222 05/22/21 0523 05/23/21 0903  WBC 10.5 18.0* 15.3* 14.7*  NEUTROABS  --  16.4*  --  13.4*  HGB 8.9* 9.8* 8.5* 9.3*  HCT 29.3* 31.2* 27.3* 28.4*  MCV 100.0 98.1 96.1 94.0  PLT 214 225 224 321   Basic Metabolic Panel: Recent Labs  Lab 05/18/21 1402 06/01/2021 2222 05/21/21 0340 05/22/21 0523 05/23/21 0903  NA 138 133* 134* 130* 129*  K 4.9 4.8 4.5 5.0 4.9  CL 103 98 102 98 94*  CO2 _0 GLUCOSE 197* 261* 252* 221* 317*  BUN 40* 73* 73* 87* 111*  CREATININE 1.81* 2.95* 2.75* 2.98* 2.96*  CALCIUM 8.6* 8.7* 7.9* 8.2* 8.6*    GFR: Estimated Creatinine Clearance: 18.8 mL/min (A) (by C-G formula based on SCr of 2.96 mg/dL (H)). Liver Function Tests: Recent Labs  Lab 06/16/2021 2222 05/22/21 0523  AST 20 18  ALT 13 10  ALKPHOS 75 69  BILITOT 1.3* 1.3*  PROT 6.0* 5.6*  ALBUMIN 3.0* 2.2*   No results for input(s): LIPASE, AMYLASE in the last 168 hours. No results for input(s): AMMONIA in the last 168 hours. Coagulation Profile: No results for input(s): INR, PROTIME in the last 168 hours. Cardiac Enzymes: Recent Labs  Lab 06/09/2021 2222  CKTOTAL 66   BNP (last 3 results) No results for input(s): PROBNP in the last 8760 hours. HbA1C: Recent Labs    05/21/21 0340  HGBA1C 7.9*   CBG: Recent Labs  Lab 05/22/21 0748 05/22/21 1153 05/22/21 1652 05/22/21 2218 05/23/21 0800  GLUCAP 206* 260* 320* 306* 286*   Lipid Profile: No results for input(s): CHOL, HDL, LDLCALC, TRIG, CHOLHDL, LDLDIRECT in the last 72 hours. Thyroid Function Tests: No results for input(s): TSH, T4TOTAL, FREET4, T3FREE, THYROIDAB in the  last 72 hours. Anemia Panel: Recent Labs    05/22/21 1453  FOLATE 22.0  FERRITIN 294  TIBC 171*  IRON 10*   Sepsis Labs: No results for input(s): PROCALCITON, LATICACIDVEN in the last 168 hours.  Recent Results (from the past 240 hour(s))  Resp Panel by RT-PCR (Flu A&B, Covid) Nasopharyngeal Swab     Status: None   Collection Time: 05/31/2021 10:22 PM   Specimen: Nasopharyngeal Swab; Nasopharyngeal(NP) swabs in vial transport medium  Result Value Ref Range Status   SARS Coronavirus 2 by RT PCR NEGATIVE NEGATIVE Final    Comment: (NOTE) SARS-CoV-2 target nucleic acids are NOT DETECTED.  The SARS-CoV-2 RNA is generally detectable in upper respiratory specimens during the acute phase of infection. The lowest concentration of SARS-CoV-2 viral copies this assay can detect is 138 copies/mL. A negative result does not preclude SARS-Cov-2 infection and should not be used as the sole  basis for treatment or other patient management decisions. A negative result may occur with  improper specimen collection/handling, submission of specimen other than nasopharyngeal swab, presence of viral mutation(s) within the areas targeted by this assay, and inadequate number of viral copies(<138 copies/mL). A negative result must be combined with clinical observations, patient history, and epidemiological information. The expected result is Negative.  Fact Sheet for Patients:  EntrepreneurPulse.com.au  Fact Sheet for Healthcare Providers:  IncredibleEmployment.be  This test is no t yet approved or cleared by the [place] FDA and  has been authorized for detection and/or diagnosis of SARS-CoV-2 by FDA under an Emergency Use Authorization (EUA). This EUA will remain  in effect (meaning this test can be used) for the duration of the COVID-19 declaration under Section 564(b)(1) of the Act, 21 U.S.C.section 360bbb-3(b)(1), unless the authorization is terminated  or revoked sooner.       Influenza A by PCR NEGATIVE NEGATIVE Final   Influenza B by PCR NEGATIVE NEGATIVE Final    Comment: (NOTE) The Xpert Xpress SARS-CoV-2/FLU/RSV plus assay is intended as an aid in the diagnosis of influenza from Nasopharyngeal swab specimens and should not be used as a sole basis for treatment. Nasal washings and aspirates are unacceptable for Xpert Xpress SARS-CoV-2/FLU/RSV testing.  Fact Sheet for Patients: EntrepreneurPulse.com.au  Fact Sheet for Healthcare Providers: IncredibleEmployment.be  This test is not yet approved or cleared by the [place] FDA and has been authorized for detection and/or diagnosis of SARS-CoV-2 by FDA under an Emergency Use Authorization (EUA). This EUA will remain in effect (meaning this test can be used) for the duration of the COVID-19 declaration under Section 564(b)(1) of the Act,  21 U.S.C. section 360bbb-3(b)(1), unless the authorization is terminated or revoked.  Performed at [facility], [address] [place], [place] [address]          Radiology Studies: US RENAL  Result Date: 05/22/2021 CLINICAL DATA:  Acute kidney injury, stage IV chronic kidney disease, CHF, diabetes mellitus, hypertension EXAM: RENAL / URINARY TRACT ULTRASOUND COMPLETE COMPARISON:  12/21/2019 FINDINGS: Right Kidney: Renal measurements: 11.4 x 4.0 x 4.8 cm = volume: 112 mL. Cortical thinning. Normal cortical echogenicity. No mass or hydronephrosis. No definite shadowing calcifications. Left Kidney: Renal measurements: 10.7 x 5.2 x 4.5 cm = volume: 131 mL. Cortical thinning. Grossly normal echogenicity. Small cyst mid kidney 19 x 18 x 15 mm. No additional mass or hydronephrosis. No shadowing calcifications. Bladder: Decompressed by catheter, unable to evaluate. Other: N/A IMPRESSION: Cortical atrophy of both kidneys without hydronephrosis. Small mid LEFT renal cyst  19 mm greatest diameter. Electronically Signed   By: Lavonia Dana M.D.   On: 05/22/2021 17:53   DG Chest Port 1 View  Result Date: 05/22/2021 CLINICAL DATA:  80 year old female with history of shortness of breath. EXAM: PORTABLE CHEST 1 VIEW COMPARISON:  Chest x-ray 05/21/2021. FINDINGS: Lung volumes are normal. No consolidative airspace disease. No pleural effusions. No pneumothorax. Several tiny calcified granulomas are noted in the lungs. No larger more suspicious appearing pulmonary nodules or masses are noted. Cephalization of the pulmonary vasculature, without frank pulmonary edema. Dilatation of the central pulmonary arteries. Mild cardiomegaly. Upper mediastinal contours are within normal limits. Atherosclerotic calcifications in the thoracic aorta. IMPRESSION: 1. Cardiomegaly with pulmonary venous congestion, but no frank pulmonary edema. 2. Dilatation of the central pulmonary arteries, concerning for pulmonary  arterial hypertension. 3. Aortic atherosclerosis. Electronically Signed   By: Vinnie Langton M.D.   On: 05/22/2021 05:03   DG Chest Port 1 View  Result Date: 05/21/2021 CLINICAL DATA:  Tachypnea EXAM: PORTABLE CHEST 1 VIEW COMPARISON:  05/18/2021 FINDINGS: Stable mild to moderate enlargement of the cardiopericardial silhouette. Mildly indistinct pulmonary vasculature may represent pulmonary venous hypertension. No overt pulmonary edema. No Kerley B lines are observed. No blunting of the costophrenic angles. Atherosclerotic calcification of the aortic arch. Suspected calcified granuloma peripherally at the right lung base. Thoracic spondylosis. IMPRESSION: 1. Mild to moderate enlargement of the cardiopericardial silhouette with potential pulmonary venous hypertension but no overt edema. 2.  Aortic Atherosclerosis (ICD10-I70.0). Electronically Signed   By: Van Clines M.D.   On: 05/21/2021 14:42        Scheduled Meds:  ambrisentan  5 mg Oral Daily   apixaban  2.5 mg Oral BID   Chlorhexidine Gluconate Cloth  6 each Topical Daily   furosemide  60 mg Intravenous BID   influenza vaccine adjuvanted  0.5 mL Intramuscular Tomorrow-1000   insulin aspart  0-15 Units Subcutaneous TID WC   insulin aspart  0-5 Units Subcutaneous QHS   insulin aspart  3 Units Subcutaneous TID WC   insulin glargine-yfgn  14 Units Subcutaneous Daily   mouth rinse  15 mL Mouth Rinse BID   venlafaxine  75 mg Oral Daily   Continuous Infusions:  [START ON 05/24/2021] levofloxacin (LEVAQUIN) IV       LOS: 2 days    Time spent: 25 minutes    Sidney Ace, MD [facility] Pager [phone]  If 7PM-7AM, please contact night-coverage 05/23/2021, 11:18 AM

## 2021-05-23 NOTE — Progress Notes (Signed)
*  PRELIMINARY RESULTS* Echocardiogram 2D Echocardiogram has been performed.  Sherrie Sport 05/23/2021, 9:28 AM

## 2021-05-23 NOTE — Progress Notes (Signed)
Physical Therapy Treatment Patient Details Name: Ann Flynn MRN: [id number] DOB: 11/14/1940 Today's Date: 05/23/2021   History of Present Illness 80 year old female with history of diastolic CHF, moderate pulmonary hypertension, IDDM 2, CKD stage IV, OSA on CPAP, a flutter s/p cardioversion on 4/22 on Eliquis.  She presented to the ED for the second time in 3 days with complaints of neck pain and knee pain with difficulty bearing weight as well as generalized weakness    PT Comments    Pt was long sitting in bed upon arriving. She was premedicated for pain but still endorsing R shoulder/neck pain. Pt was on 4 L o2 throughout. Session was greatly limited by pain and especially anxiety. Pt is very fearful with minimal activity. Constant relaxation cues but pt continues to be severely limited. MD made aware and PRN anxiety medication ordered. Author attempted to assist pt to EOB short sit however pt quickly becomes SOB and yells out in fear/pain. Max assist to return to bed and max vcs for relaxation. Pt took several minutes to settle but eventually does calm down and relax.  Will need to be premedicated for anxiety prior to next session. Highly recommend DC to SNF to address deficits while assisting pt to PLOF.    Recommendations for follow up therapy are one component of a multi-disciplinary discharge planning process, led by the attending physician.  Recommendations may be updated based on patient status, additional functional criteria and insurance authorization.  Follow Up Recommendations  SNF;Supervision/Assistance - 24 hour     Equipment Recommendations  Other (comment) (defer to next level of care)       Precautions / Restrictions Precautions Precautions: None Precaution Comments: Anxious/ tremors/ SOB with minimal activity Restrictions Weight Bearing Restrictions: No     Mobility  Bed Mobility Overal bed mobility: Needs Assistance Bed Mobility: Supine to Sit;Sit to Supine      Supine to sit: Mod assist Sit to supine: Max assist   General bed mobility comments: Pt was long sitting in bed upon arriving on 4 L o2. She agrees to session and is cooperative. session progression greatly limited by anxiety    Transfers      General transfer comment: unable to progress to transfers due to severe anxiety/panic at EOB short sit    Balance Overall balance assessment: Needs assistance Sitting-balance support: Bilateral upper extremity supported;Feet supported Sitting balance-Leahy Scale: Fair Sitting balance - Comments: Pt's svere anxiety/complaint of pain greatly limits pt's ability to tolerate sitting EOB x > 2 minutes.       Cognition Arousal/Alertness: Awake/alert Behavior During Therapy: Anxious (ANXIOUS!) Overall Cognitive Status: No family/caregiver present to determine baseline cognitive functioning        General Comments: Pt was alert however extremely anxious. Gets worked up with very minimal activity. MD made aware.             Pertinent Vitals/Pain Pain Assessment: 0-10 Pain Score: 6  Pain Location: R shoulder/neck Pain Descriptors / Indicators: Stabbing;Sharp;Shooting;Grimacing;Guarding;Tender Pain Intervention(s): Limited activity within patient's tolerance;Monitored during session;Repositioned;Other (comment) (MD notified about anxiety concerns)     PT Goals (current goals can now be found in the care plan section) Acute Rehab PT Goals Patient Stated Goal: none stated Progress towards PT goals: Not progressing toward goals - comment (Severely limited by pain,anxiety, and SOB)    Frequency    Min 2X/week      PT Plan Current plan remains appropriate       AM-PAC PT "6 Clicks" Mobility  Outcome Measure  Help needed turning from your back to your side while in a flat bed without using bedrails?: A Lot Help needed moving from lying on your back to sitting on the side of a flat bed without using bedrails?: A Lot Help needed  moving to and from a bed to a chair (including a wheelchair)?: Total Help needed standing up from a chair using your arms (e.g., wheelchair or bedside chair)?: Total Help needed to walk in hospital room?: Total Help needed climbing 3-5 steps with a railing? : Total 6 Click Score: 8    End of Session   Activity Tolerance: Other (comment);Patient limited by pain (severely limited by anxiety/pain) Patient left: in bed;with call bell/phone within reach;with bed alarm set Nurse Communication: Mobility status PT Visit Diagnosis: Muscle weakness (generalized) (M62.81);Difficulty in walking, not elsewhere classified (R26.2);Pain Pain - Right/Left: Left Pain - part of body: Knee     Time: 1027-2536 PT Time Calculation (min) (ACUTE ONLY): 18 min  Charges:  $Therapeutic Activity: 8-22 mins                     Julaine Fusi PTA 05/23/21, 4:33 PM

## 2021-05-23 NOTE — Progress Notes (Signed)
[facility]  ROUNDING NOTE   Subjective:   [name] was admitted to [facility] on 06/12/2021 for Weakness [R53.1] AKI (acute kidney injury) [facility]) [N17.9] Acute pain of left knee [M25.562] Acute renal insufficiency [N28.9]  Patient was last seen in the office by my partner, Dr. Candiss Flynn, on 9/15. Her creatinine was 1.83 with GFR of 28. Patient she was in her usual state of health at that time. She was compliant and taking all her medications.   Patient was originally seen in the ED on 10/1 where she was given topical treatments for her neck strain and knee pain. She states these only helped a little. Patient returned on 10/3 with hypotension, acute kidney injury and leukocytosis. She was then started on IV fluids. No contrast exposure.   Patient sitting up in bed Ill appearing States she continues to feel short of breath, but has improved Foley in place   Objective:  Vital signs in last 24 hours:  Temp:  [97.6 F (36.4 C)-98.1 F (36.7 C)] 97.8 F (36.6 C) (10/06 1005) Pulse Rate:  [64-92] 89 (10/06 1005) Resp:  [16-22] 22 (10/06 1005) BP: (128-151)/(58-71) 128/71 (10/06 1005) SpO2:  [94 %-95 %] 95 % (10/06 1031)  Weight change:  Filed Weights   06/04/2021 2044 05/21/21 0603  Weight: 106.6 kg 111.1 kg    Intake/Output: I/O last 3 completed shifts: In: 340 [P.O.:240; IV Piggyback:100] Out: 2650 [Urine:2650]   Intake/Output this shift:  Total I/O In: 240 [P.O.:240] Out: -   Physical Exam: General: NAD, laying in bed  Head: Dry oral mucosa  Eyes: Anicteric  Lungs:  Basilar crackles, [place] O2 5L  Heart: Regular rate and rhythm  Abdomen:  Soft, nontender  Extremities:  1+ peripheral edema.  Neurologic: Nonfocal, able to answer questions  Skin: No lesions        Basic Metabolic Panel: Recent Labs  Lab 05/18/21 1402 05/29/2021 2222 05/21/21 0340 05/22/21 0523 05/23/21 0903  NA 138 133* 134* 130* 129*  K 4.9 4.8 4.5 5.0 4.9  CL 103 98 102 98 94*   CO2 _0 GLUCOSE 197* 261* 252* 221* 317*  BUN 40* 73* 73* 87* 111*  CREATININE 1.81* 2.95* 2.75* 2.98* 2.96*  CALCIUM 8.6* 8.7* 7.9* 8.2* 8.6*     Liver Function Tests: Recent Labs  Lab 06/15/2021 2222 05/22/21 0523  AST 20 18  ALT 13 10  ALKPHOS 75 69  BILITOT 1.3* 1.3*  PROT 6.0* 5.6*  ALBUMIN 3.0* 2.2*    No results for input(s): LIPASE, AMYLASE in the last 168 hours. No results for input(s): AMMONIA in the last 168 hours.  CBC: Recent Labs  Lab 05/18/21 1402 05/18/2021 2222 05/22/21 0523 05/23/21 0903  WBC 10.5 18.0* 15.3* 14.7*  NEUTROABS  --  16.4*  --  13.4*  HGB 8.9* 9.8* 8.5* 9.3*  HCT 29.3* 31.2* 27.3* 28.4*  MCV 100.0 98.1 96.1 94.0  PLT 214 225 224 231     Cardiac Enzymes: Recent Labs  Lab 06/05/2021 2222  CKTOTAL 66     BNP: Invalid input(s): POCBNP  CBG: Recent Labs  Lab 05/22/21 1153 05/22/21 1652 05/22/21 2218 05/23/21 0800 05/23/21 1124  GLUCAP 260* 320* 306* 286* 319*     Microbiology: Results for orders placed or performed during the hospital encounter of 06/12/2021  Resp Panel by RT-PCR (Flu A&B, Covid) Nasopharyngeal Swab     Status: None   Collection Time: 05/23/2021 10:22 PM   Specimen: Nasopharyngeal  Swab; Nasopharyngeal(NP) swabs in vial transport medium  Result Value Ref Range Status   SARS Coronavirus 2 by RT PCR NEGATIVE NEGATIVE Final    Comment: (NOTE) SARS-CoV-2 target nucleic acids are NOT DETECTED.  The SARS-CoV-2 RNA is generally detectable in upper respiratory specimens during the acute phase of infection. The lowest concentration of SARS-CoV-2 viral copies this assay can detect is 138 copies/mL. A negative result does not preclude SARS-Cov-2 infection and should not be used as the sole basis for treatment or other patient management decisions. A negative result may occur with  improper specimen collection/handling, submission of specimen other than nasopharyngeal swab, presence of viral mutation(s)  within the areas targeted by this assay, and inadequate number of viral copies(<138 copies/mL). A negative result must be combined with clinical observations, patient history, and epidemiological information. The expected result is Negative.  Fact Sheet for Patients:  EntrepreneurPulse.com.au  Fact Sheet for Healthcare Providers:  IncredibleEmployment.be  This test is no t yet approved or cleared by the [place] FDA and  has been authorized for detection and/or diagnosis of SARS-CoV-2 by FDA under an Emergency Use Authorization (EUA). This EUA will remain  in effect (meaning this test can be used) for the duration of the COVID-19 declaration under Section 564(b)(1) of the Act, 21 U.S.C.section 360bbb-3(b)(1), unless the authorization is terminated  or revoked sooner.       Influenza A by PCR NEGATIVE NEGATIVE Final   Influenza B by PCR NEGATIVE NEGATIVE Final    Comment: (NOTE) The Xpert Xpress SARS-CoV-2/FLU/RSV plus assay is intended as an aid in the diagnosis of influenza from Nasopharyngeal swab specimens and should not be used as a sole basis for treatment. Nasal washings and aspirates are unacceptable for Xpert Xpress SARS-CoV-2/FLU/RSV testing.  Fact Sheet for Patients: EntrepreneurPulse.com.au  Fact Sheet for Healthcare Providers: IncredibleEmployment.be  This test is not yet approved or cleared by the [place] FDA and has been authorized for detection and/or diagnosis of SARS-CoV-2 by FDA under an Emergency Use Authorization (EUA). This EUA will remain in effect (meaning this test can be used) for the duration of the COVID-19 declaration under Section 564(b)(1) of the Act, 21 U.S.C. section 360bbb-3(b)(1), unless the authorization is terminated or revoked.  Performed at [facility], [address] [place], [place] [address]     Coagulation Studies: No results for  input(s): LABPROT, INR in the last 72 hours.  Urinalysis: Recent Labs    05/22/21 1215  COLORURINE YELLOW*  LABSPEC 1.014  PHURINE 5.0  GLUCOSEU NEGATIVE  HGBUR LARGE*  BILIRUBINUR NEGATIVE  KETONESUR NEGATIVE  PROTEINUR 100*  NITRITE NEGATIVE  LEUKOCYTESUR MODERATE*       Imaging: US RENAL  Result Date: 05/22/2021 CLINICAL DATA:  Acute kidney injury, stage IV chronic kidney disease, CHF, diabetes mellitus, hypertension EXAM: RENAL / URINARY TRACT ULTRASOUND COMPLETE COMPARISON:  12/21/2019 FINDINGS: Right Kidney: Renal measurements: 11.4 x 4.0 x 4.8 cm = volume: 112 mL. Cortical thinning. Normal cortical echogenicity. No mass or hydronephrosis. No definite shadowing calcifications. Left Kidney: Renal measurements: 10.7 x 5.2 x 4.5 cm = volume: 131 mL. Cortical thinning. Grossly normal echogenicity. Small cyst mid kidney 19 x 18 x 15 mm. No additional mass or hydronephrosis. No shadowing calcifications. Bladder: Decompressed by catheter, unable to evaluate. Other: N/A IMPRESSION: Cortical atrophy of both kidneys without hydronephrosis. Small mid LEFT renal cyst 19 mm greatest diameter. Electronically Signed   By: Lavonia Dana M.D.   On: 05/22/2021 17:53   DG Chest  Port 1 View  Result Date: 05/22/2021 CLINICAL DATA:  80 year old female with history of shortness of breath. EXAM: PORTABLE CHEST 1 VIEW COMPARISON:  Chest x-ray 05/21/2021. FINDINGS: Lung volumes are normal. No consolidative airspace disease. No pleural effusions. No pneumothorax. Several tiny calcified granulomas are noted in the lungs. No larger more suspicious appearing pulmonary nodules or masses are noted. Cephalization of the pulmonary vasculature, without frank pulmonary edema. Dilatation of the central pulmonary arteries. Mild cardiomegaly. Upper mediastinal contours are within normal limits. Atherosclerotic calcifications in the thoracic aorta. IMPRESSION: 1. Cardiomegaly with pulmonary venous congestion, but no frank  pulmonary edema. 2. Dilatation of the central pulmonary arteries, concerning for pulmonary arterial hypertension. 3. Aortic atherosclerosis. Electronically Signed   By: Vinnie Langton M.D.   On: 05/22/2021 05:03   DG Chest Port 1 View  Result Date: 05/21/2021 CLINICAL DATA:  Tachypnea EXAM: PORTABLE CHEST 1 VIEW COMPARISON:  05/18/2021 FINDINGS: Stable mild to moderate enlargement of the cardiopericardial silhouette. Mildly indistinct pulmonary vasculature may represent pulmonary venous hypertension. No overt pulmonary edema. No Kerley B lines are observed. No blunting of the costophrenic angles. Atherosclerotic calcification of the aortic arch. Suspected calcified granuloma peripherally at the right lung base. Thoracic spondylosis. IMPRESSION: 1. Mild to moderate enlargement of the cardiopericardial silhouette with potential pulmonary venous hypertension but no overt edema. 2.  Aortic Atherosclerosis (ICD10-I70.0). Electronically Signed   By: Van Clines M.D.   On: 05/21/2021 14:42     Medications:    [START ON 05/24/2021] levofloxacin (LEVAQUIN) IV      ambrisentan  5 mg Oral Daily   apixaban  2.5 mg Oral BID   Chlorhexidine Gluconate Cloth  6 each Topical Daily   furosemide  60 mg Intravenous BID   influenza vaccine adjuvanted  0.5 mL Intramuscular Tomorrow-1000   insulin aspart  0-15 Units Subcutaneous TID WC   insulin aspart  0-5 Units Subcutaneous QHS   insulin aspart  3 Units Subcutaneous TID WC   insulin glargine-yfgn  14 Units Subcutaneous Daily   mouth rinse  15 mL Mouth Rinse BID   methocarbamol  500 mg Oral TID   venlafaxine  75 mg Oral Daily   acetaminophen **OR** acetaminophen, albuterol, morphine injection, ondansetron **OR** ondansetron (ZOFRAN) IV  Assessment/ Plan:  Ann Flynn is a 80 y.o. white female with diabetes mellitus type II insulin dependent, atrial fibrillation, diastolic congestive heart failure, pulmonary hypertension, sleep apnea, history of  PE, who is admitted to [facility] on 05/19/2021 for Weakness [R53.1] AKI (acute kidney injury) ([place]) [N17.9] Acute pain of left knee [M25.562] Acute renal insufficiency [N28.9]  Acute kidney injury on chronic kidney disease stage IV with proteinuria: baseline creatinine 1.83, GFR of 28 on 05/01/2021. Chronic kidney disease secondary to diabetic nephropathy. Acute kidney injury secondary to obstructive uropathy and acute cardiorenal syndrome. No IV contrast exposure.  -Foley catheter placed, UOP 2654m in 24 hours - Renal ultrasound shows bilateral kidney cortical atrophy, no obstruction, small left renal cyst - holding losartan.  - Lacks measurable renal recovery overnight, but this is expected. Will continue to monitor  Hypertension: stable 128/71. Currently holding home regimen of losartan.   Acute exacerbation of diastolic congestive heart failure:  - Echocardiogram results pending - Continue IV furosemide. 647mIV q12  Anemia with chronic kidney disease: normocytic, hemoglobin 8.5. Labs consistent with iron deficiency on 7/25.  - check iron studies indicate anemia and SPEP/UPEP.   Diabetes mellitus type II with chronic kidney disease: insulin dependent: hemoglobin A1c of  7.9%.  - continue glucose control.    LOS: 2   10/6/202212:56 PM

## 2021-05-24 DIAGNOSIS — N179 Acute kidney failure, unspecified: Secondary | ICD-10-CM | POA: Diagnosis not present

## 2021-05-24 DIAGNOSIS — N189 Chronic kidney disease, unspecified: Secondary | ICD-10-CM | POA: Diagnosis not present

## 2021-05-24 LAB — PROTEIN ELECTROPHORESIS, SERUM
A/G Ratio: 0.9 (ref 0.7–1.7)
Albumin ELP: 2.3 g/dL — ABNORMAL LOW (ref 2.9–4.4)
Alpha-1-Globulin: 0.6 g/dL — ABNORMAL HIGH (ref 0.0–0.4)
Alpha-2-Globulin: 1.1 g/dL — ABNORMAL HIGH (ref 0.4–1.0)
Beta Globulin: 0.7 g/dL (ref 0.7–1.3)
Gamma Globulin: 0.3 g/dL — ABNORMAL LOW (ref 0.4–1.8)
Globulin, Total: 2.6 g/dL (ref 2.2–3.9)
M-Spike, %: 0.1 g/dL — ABNORMAL HIGH
Total Protein ELP: 4.9 g/dL — ABNORMAL LOW (ref 6.0–8.5)

## 2021-05-24 LAB — BASIC METABOLIC PANEL
Anion gap: 11 (ref 5–15)
BUN: 131 mg/dL — ABNORMAL HIGH (ref 8–23)
CO2: 24 mmol/L (ref 22–32)
Calcium: 8.6 mg/dL — ABNORMAL LOW (ref 8.9–10.3)
Chloride: 94 mmol/L — ABNORMAL LOW (ref 98–111)
Creatinine, Ser: 2.82 mg/dL — ABNORMAL HIGH (ref 0.44–1.00)
GFR, Estimated: 16 mL/min — ABNORMAL LOW (ref >60)
Glucose, Bld: 295 mg/dL — ABNORMAL HIGH (ref 70–99)
Potassium: 4.5 mmol/L (ref 3.5–5.1)
Sodium: 129 mmol/L — ABNORMAL LOW (ref 135–145)

## 2021-05-24 LAB — GLUCOSE, CAPILLARY
Glucose-Capillary: 283 mg/dL — ABNORMAL HIGH (ref 70–99)
Glucose-Capillary: 318 mg/dL — ABNORMAL HIGH (ref 70–99)
Glucose-Capillary: 170 mg/dL — ABNORMAL HIGH (ref 70–99)
Glucose-Capillary: 164 mg/dL — ABNORMAL HIGH (ref 70–99)

## 2021-05-24 LAB — HEPATITIS C ANTIBODY: HCV Ab: <0.1 s/co ratio — AB (ref 0.0–0.9)

## 2021-05-24 LAB — PROTEIN ELECTRO, RANDOM URINE
Albumin ELP, Urine: 74.4 %
Alpha-1-Globulin, U: 1.5 %
Alpha-2-Globulin, U: 6.5 %
Beta Globulin, U: 11 %
Gamma Globulin, U: 6.5 %
Total Protein, Urine: 98.6 mg/dL

## 2021-05-24 LAB — URINE CULTURE

## 2021-05-24 MED ORDER — METHOCARBAMOL 500 MG PO TABS
500.0000 mg | ORAL_TABLET | Freq: Four times a day (QID) | ORAL | Status: DC | PRN
  Administered 2021-05-24: 500 mg via ORAL
  Filled 2021-05-24: qty 1

## 2021-05-24 MED ORDER — INSULIN GLARGINE-YFGN 100 UNIT/ML ~~LOC~~ SOLN
15.0000 [IU] | Freq: Every day | SUBCUTANEOUS | Status: DC
  Administered 2021-05-24 – 2021-05-26 (×3): 15 [IU] via SUBCUTANEOUS
  Filled 2021-05-24 (×4): qty 0.15

## 2021-05-24 MED ORDER — METHOCARBAMOL 500 MG PO TABS
750.0000 mg | ORAL_TABLET | Freq: Three times a day (TID) | ORAL | Status: DC
  Administered 2021-05-24 – 2021-05-25 (×4): 750 mg via ORAL
  Filled 2021-05-24 (×5): qty 2

## 2021-05-24 MED ORDER — INSULIN ASPART 100 UNIT/ML IJ SOLN
4.0000 [IU] | Freq: Three times a day (TID) | INTRAMUSCULAR | Status: DC
  Administered 2021-05-24 – 2021-05-26 (×7): 4 [IU] via SUBCUTANEOUS
  Filled 2021-05-24 (×7): qty 1

## 2021-05-24 NOTE — Consult Note (Addendum)
Pharmacy Antibiotic Note  Ann Flynn is a 80 y.o. female admitted on 06/03/2021 with c/o weakness and knee pain. Pt began experiencing respiratory distress, and fevers on 10/4. Pharmacy has been consulted for levofloxacin dosing for empiric CAP coverage. Currently on day 3 of antibiotics. Pt is afebrile with a Tmax of 97.9. WBC trending down 18>15.3>14.7. Scr improving as stated below.   Plan: Pt received levofloxacin 750 mg x 1 followed by levofloxacin 500 mg q48H. Plan for 5 days of treatment. Last dose scheduled for 10/9 AM. Continue to monitor CrCl and adjust dose as needed.   Height: _0  (165.1 cm) Weight: 111.1 kg (244 lb 14.9 oz) IBW/kg (Calculated) : 57  Temp (24hrs), Avg:97.7 F (36.5 C), Min:97.5 F (36.4 C), Max:97.9 F (36.6 C)  Recent Labs  Lab 05/18/21 1402 05/19/2021 2222 05/21/21 0340 05/22/21 0523 05/23/21 0903 05/24/21 0524  WBC 10.5 18.0*  --  15.3* 14.7*  --   CREATININE 1.81* 2.95* 2.75* 2.98* 2.96* 2.82*    Estimated Creatinine Clearance: 19.7 mL/min (A) (by C-G formula based on SCr of 2.82 mg/dL (H)).    Allergies  Allergen Reactions   Penicillins Swelling    Facial swelling Did it involve swelling of the face/tongue/throat, SOB, or low BP? Yes Did it involve sudden or severe rash/hives, skin peeling, or any reaction on the inside of your mouth or nose? No Did you need to seek medical attention at a hospital or doctor's office? No When did it last happen?      10 + years If all above answers are "NO", may proceed with cephalosporin use.    Sulfa Antibiotics Rash   Uptravi [Selexipag] Other (See Comments)    headaches    Antimicrobials this admission: Levofloxacin 750 mg x 1 dose (10/5)  Levofloxacin 500 mg q48h (10/7>> )    Dose adjustments this admission: none  Microbiology results: none  Thank you for allowing pharmacy to be a part of this patient's care.  [place] Camika Marsico, [place] PharmD Candidate 23'  05/24/2021 1:02 PM

## 2021-05-24 NOTE — Care Management Important Message (Signed)
Important Message  Patient Details  Name: ELDORIS BEISER MRN: [id number] Date of Birth: 1941/05/21   Medicare Important Message Given:  Yes     Dannette Barbara 05/24/2021, 11:53 AM

## 2021-05-24 NOTE — Progress Notes (Signed)
Occupational Therapy Treatment Patient Details Name: Ann Flynn MRN: [id number] DOB: [birth year] Today's Date: 05/24/2021   History of present illness 80 year old female with history of diastolic CHF, moderate pulmonary hypertension, IDDM 2, CKD stage IV, OSA on CPAP, a flutter s/p cardioversion on 4/22 on Eliquis.  She presented to the ED for the second time in 3 days with complaints of neck pain and knee pain with difficulty bearing weight as well as generalized weakness   OT comments  Pt seen for OT tx this date. Pt sleeping soundly upon OT's arrival, family present. Pt required increased time/VC/TC and ultimately a cool washcloth to improve alertness for session. Pt performed bed mobility requiring MIN A for BLE mgt. Maintained sitting balance EOB with supervision - CGA/SBA. PRN VC for PLB after initial instruction to support recovery with exertion. Pt stood with Min A + RW and CGA for lateral shuffling steps before returning to bed with MIN A. Pt endorsed R shoulder and L hip pain with movement. Pt continues to benefit from skilled OT Services. Continue to recommend SNF to maximize return to PLOF.    Recommendations for follow up therapy are one component of a multi-disciplinary discharge planning process, led by the attending physician.  Recommendations may be updated based on patient status, additional functional criteria and insurance authorization.    Follow Up Recommendations  SNF    Equipment Recommendations  3 in 1 bedside commode    Recommendations for Other Services      Precautions / Restrictions Precautions Precautions: Fall Precaution Comments: less anxious this date, confused, tremulous Restrictions Weight Bearing Restrictions: No       Mobility Bed Mobility Overal bed mobility: Needs Assistance Bed Mobility: Sit to Supine;Supine to Sit     Supine to sit: Min assist;HOB elevated Sit to supine: Min assist   General bed mobility comments: cues to initiate,  MIN A for BLE mgt    Transfers Overall transfer level: Needs assistance Equipment used: Rolling walker (2 wheeled) Transfers: Sit to/from Stand;Lateral/Scoot Transfers Sit to Stand: Min assist;From elevated surface        Lateral/Scoot Transfers: Min guard General transfer comment: Pt stood from EOB with MIN A, took lateral steps EOB w/ RW, shuffling feet more than actually picking feet up off the floor    Balance Overall balance assessment: Needs assistance Sitting-balance support: Feet supported;Single extremity supported Sitting balance-Leahy Scale: Fair Sitting balance - Comments: anxious, cues for PLB   Standing balance support: Bilateral upper extremity supported;During functional activity Standing balance-Leahy Scale: Poor Standing balance comment: Reliant on B UE support                           ADL either performed or assessed with clinical judgement   ADL Overall ADL's : Needs assistance/impaired     Grooming: Sitting;Set up;Supervision/safety;Wash/dry Biomedical engineer      Cognition Arousal/Alertness: Awake/alert Behavior During Therapy: WFL for tasks assessed/performed;Flat affect Overall Cognitive Status: Difficult to assess                                 General Comments: Pt  initially sleeping sound, difficult to rouse for any length of time, with cool washcloth and increased VC/TC, pt alertness improves enough to participate in bed mobility        Exercises Other Exercises Other Exercises: Pt instructed in PLB to support breath recovery and anxiety during ADL/mobility/exertional activities   Shoulder Instructions       General Comments      Pertinent Vitals/ Pain       Pain Assessment: Faces Faces Pain Scale: Hurts a little bit Pain Location: R shoulder/neck, L hip Pain Descriptors / Indicators: Grimacing Pain Intervention(s): Limited  activity within patient's tolerance;Monitored during session;Premedicated before session;Repositioned  Home Living                                          Prior Functioning/Environment              Frequency  Min 1X/week        Progress Toward Goals  OT Goals(current goals can now be found in the care plan section)  Progress towards OT goals: Progressing toward goals  Acute Rehab OT Goals Patient Stated Goal: to get stronger OT Goal Formulation: With patient Time For Goal Achievement: 06/04/21 Potential to Achieve Goals: Good  Plan Discharge plan remains appropriate;Frequency remains appropriate    Co-evaluation                 AM-PAC OT "6 Clicks" Daily Activity     Outcome Measure   Help from another person eating meals?: None Help from another person taking care of personal grooming?: A Little Help from another person toileting, which includes using toliet, bedpan, or urinal?: A Lot Help from another person bathing (including washing, rinsing, drying)?: A Lot Help from another person to put on and taking off regular upper body clothing?: A Lot Help from another person to put on and taking off regular lower body clothing?: A Lot 6 Click Score: 15    End of Session    OT Visit Diagnosis: Other abnormalities of gait and mobility (R26.89);Muscle weakness (generalized) (M62.81);Pain Pain - Right/Left: Right (R shoulder, L hip) Pain - part of body: Shoulder   Activity Tolerance Patient tolerated treatment well   Patient Left in bed;with call bell/phone within reach;with bed alarm set;with family/visitor present   Nurse Communication          Time: 4599-7741 OT Time Calculation (min): 30 min  Charges: OT General Charges $OT Visit: 1 Visit OT Treatments $Self Care/Home Management : 23-37 mins  Ardeth Perfect., MPH, MS, OTR/L ascom [phone] 05/24/21, 4:40 PM

## 2021-05-24 NOTE — Progress Notes (Signed)
PROGRESS NOTE    Ann Flynn  ZMO:[id number] DOB: 15-Jan-1941 DOA: 06/09/2021 PCP: Birdie Sons, MD   Brief Narrative:   80 y.o. female with medical history significant for diastolic CHF, moderate pulmonary hypertension, IDDM 2, CKD stage IV, OSA on CPAP, , a flutter s/p cardioversion on 4/22 on Eliquis and amiodarone, who presents to the ED for the second time in 3 days with complaints of neck pain and knee pain with difficulty bearing weight and difficulty moving around the house where she lives alone with a daughter that lives nearby.  She denies other complaints.  On her first visit to the ED on 10/1 she had an essentially negative work-up and was discharged with Voltaren gel and a Lidoderm patch for suspected neck strain.  She was brought back to the emergency room on 10/3 because family noted her to be increasingly weak, at this time not explained by her neck pain or knee pain.  She had no cough chest pain or shortness of breath, no nausea or vomiting, but family is concerned for her oral intake given decreasing ability to care for herself at home  10/5: Called to bedside by RN this morning.  Patient with increased work of breathing and increasingly somnolent.  Evaluated bedside.  Rhonchorous breath sounds.  Vital signs stable.  3 L..  Blood gas reassuring.  10/6: Foley placed yesterday.  1400 cc urine liberated.  Patient net -1500cc.  Respiratory status remains tenuous.  Mentation improved   Assessment & Plan:   Principal Problem:   Acute kidney injury superimposed on CKD IV (HCC) Active Problems:   OSA on CPAP   Chronic pulmonary embolism, unspecified pulmonary embolism type, unspecified whether acute cor pulmonale present (HCC)   Morbid (severe) obesity due to excess calories (HCC)   Atrial fibrillation, chronic (HCC)   Diabetes mellitus, type II ([place])   Pulmonary hypertension (HCC)   Neck pain   Effusion of left knee   Hyponatremia   Anemia in chronic kidney disease  (CKD)   Acute renal insufficiency  AKI on CKD stage IV Likely multifactorial Initially concern for ATN versus prerenal azotemia Now likely element of cardiorenal syndrome Renal US, cortical atrophy without hydronephrosis Nephrology following, recommendations appreciated Plan: Continue Lasix 60 mg IV twice daily Daily renal function Appreciate nephrology follow-up  Acute on chronic diastolic congestive heart failure Initially not overloaded Received IV fluid hydration Creatinine continue to worsen Now suspect cardiorenal syndrome Plan: Diuretics as above Daily weights Continue beta-blocker Hold ACE inhibitor  Neck pain Left knee effusion with ambulatory dysfunction Diagnostic x-rays negative for fracture Moderate effusion left knee Plan: Robaxin 750 3 times daily scheduled Continue Dilaudid prn for breakthrough As needed pain control Avoid NSAIDs Therapy evaluations If pain persistent will consider orthopedic evaluation  Type 2 diabetes mellitus with hyperglycemia Semglee 15 units daily NovoLog 4 units 3 times daily with meals Moderate sliding scale  Chronic hypoxic respiratory failure Patient baseline 3 L oxygen Currently on this rate Increased work of breathing noted 10/5 Likely multifactorial etiology Plan: No further steroids Lasix 60 mg IV twice daily Empiric Levaquin for CAP coverage, 5-day course Vitals per unit protocol  Depression PTA Effexor  Paroxysmal atrial fibrillation Rate controlled Continue Eliquis Continue metoprolol  OSA on nightly CPAP      DVT prophylaxis: Eliquis Code Status: Full Family Communication: 2 family members at bedside this morning 10/7 Disposition Plan: Status is: Inpatient  Remains inpatient appropriate because:Inpatient level of care appropriate due to severity of illness  Dispo: The patient is from: Home              Anticipated d/c is to: SNF              Patient currently is not medically stable to  d/c.   Difficult to place patient No       Level of care: Med-Surg  Consultants:  None  Procedures:  None  Antimicrobials:  Levofloxacin   Subjective: Seen and examined.  Mental status improved.  Work of breathing improved.  More awake this morning.  Family bedside  Objective: Vitals:   05/23/21 2035 05/24/21 0459 05/24/21 0801 05/24/21 1054  BP: (!) 129/58 135/68 (!) 124/59   Pulse: 94 (!) 55 64   Resp:  20 16   Temp:  97.6 F (36.4 C) 97.9 F (36.6 C)   TempSrc:  Oral Oral   SpO2:  97% 96% 92%  Weight:      Height:        Intake/Output Summary (Last 24 hours) at 05/24/2021 1130 Last data filed at 05/24/2021 1033 Gross per 24 hour  Intake 480 ml  Output 1700 ml  Net -1220 ml   Filed Weights   06/03/2021 2044 05/21/21 0603  Weight: 106.6 kg 111.1 kg    Examination:  General exam: Appears fatigued.  No acute distress Respiratory system: Bibasilar crackles.  Normal work of breathing.  4 L Cardiovascular system: S1-S2, regular rate, irregular rhythm, no murmurs Gastrointestinal system: Obese, NT/ND, normal bowel sounds Central nervous system: Fatigue.  Oriented.  No focal deficits Extremities: Diffusely decreased power bilaterally Skin: No rashes, lesions or ulcers Psychiatry: Judgement and insight appear impaired. Mood & affect flattened.     Data Reviewed: I have personally reviewed following labs and imaging studies  CBC: Recent Labs  Lab 05/18/21 1402 06/14/2021 2222 05/22/21 0523 05/23/21 0903  WBC 10.5 18.0* 15.3* 14.7*  NEUTROABS  --  16.4*  --  13.4*  HGB 8.9* 9.8* 8.5* 9.3*  HCT 29.3* 31.2* 27.3* 28.4*  MCV 100.0 98.1 96.1 94.0  PLT 214 225 224 503   Basic Metabolic Panel: Recent Labs  Lab 06/13/2021 2222 05/21/21 0340 05/22/21 0523 05/23/21 0903 05/24/21 0524  NA 133* 134* 130* 129* 129*  K 4.8 4.5 5.0 4.9 4.5  CL 98 102 98 94* 94*  CO2 _0 GLUCOSE 261* 252* 221* 317* 295*  BUN 73* 73* 87* 111* 131*  CREATININE  2.95* 2.75* 2.98* 2.96* 2.82*  CALCIUM 8.7* 7.9* 8.2* 8.6* 8.6*   GFR: Estimated Creatinine Clearance: 19.7 mL/min (A) (by C-G formula based on SCr of 2.82 mg/dL (H)). Liver Function Tests: Recent Labs  Lab 05/27/2021 2222 05/22/21 0523  AST 20 18  ALT 13 10  ALKPHOS 75 69  BILITOT 1.3* 1.3*  PROT 6.0* 5.6*  ALBUMIN 3.0* 2.2*   No results for input(s): LIPASE, AMYLASE in the last 168 hours. No results for input(s): AMMONIA in the last 168 hours. Coagulation Profile: No results for input(s): INR, PROTIME in the last 168 hours. Cardiac Enzymes: Recent Labs  Lab 05/23/2021 2222  CKTOTAL 66   BNP (last 3 results) No results for input(s): PROBNP in the last 8760 hours. HbA1C: No results for input(s): HGBA1C in the last 72 hours.  CBG: Recent Labs  Lab 05/23/21 0800 05/23/21 1124 05/23/21 1624 05/23/21 2041 05/24/21 0758  GLUCAP 286* 319* 329* 292* 283*   Lipid Profile: No results for input(s): CHOL, HDL, LDLCALC, TRIG, CHOLHDL, LDLDIRECT in  the last 72 hours. Thyroid Function Tests: No results for input(s): TSH, T4TOTAL, FREET4, T3FREE, THYROIDAB in the last 72 hours. Anemia Panel: Recent Labs    05/22/21 1453  FOLATE 22.0  FERRITIN 294  TIBC 171*  IRON 10*   Sepsis Labs: No results for input(s): PROCALCITON, LATICACIDVEN in the last 168 hours.  Recent Results (from the past 240 hour(s))  Resp Panel by RT-PCR (Flu A&B, Covid) Nasopharyngeal Swab     Status: None   Collection Time: 06/17/2021 10:22 PM   Specimen: Nasopharyngeal Swab; Nasopharyngeal(NP) swabs in vial transport medium  Result Value Ref Range Status   SARS Coronavirus 2 by RT PCR NEGATIVE NEGATIVE Final    Comment: (NOTE) SARS-CoV-2 target nucleic acids are NOT DETECTED.  The SARS-CoV-2 RNA is generally detectable in upper respiratory specimens during the acute phase of infection. The lowest concentration of SARS-CoV-2 viral copies this assay can detect is 138 copies/mL. A negative result does  not preclude SARS-Cov-2 infection and should not be used as the sole basis for treatment or other patient management decisions. A negative result may occur with  improper specimen collection/handling, submission of specimen other than nasopharyngeal swab, presence of viral mutation(s) within the areas targeted by this assay, and inadequate number of viral copies(<138 copies/mL). A negative result must be combined with clinical observations, patient history, and epidemiological information. The expected result is Negative.  Fact Sheet for Patients:  EntrepreneurPulse.com.au  Fact Sheet for Healthcare Providers:  IncredibleEmployment.be  This test is no t yet approved or cleared by the [place] FDA and  has been authorized for detection and/or diagnosis of SARS-CoV-2 by FDA under an Emergency Use Authorization (EUA). This EUA will remain  in effect (meaning this test can be used) for the duration of the COVID-19 declaration under Section 564(b)(1) of the Act, 21 U.S.C.section 360bbb-3(b)(1), unless the authorization is terminated  or revoked sooner.       Influenza A by PCR NEGATIVE NEGATIVE Final   Influenza B by PCR NEGATIVE NEGATIVE Final    Comment: (NOTE) The Xpert Xpress SARS-CoV-2/FLU/RSV plus assay is intended as an aid in the diagnosis of influenza from Nasopharyngeal swab specimens and should not be used as a sole basis for treatment. Nasal washings and aspirates are unacceptable for Xpert Xpress SARS-CoV-2/FLU/RSV testing.  Fact Sheet for Patients: EntrepreneurPulse.com.au  Fact Sheet for Healthcare Providers: IncredibleEmployment.be  This test is not yet approved or cleared by the [place] FDA and has been authorized for detection and/or diagnosis of SARS-CoV-2 by FDA under an Emergency Use Authorization (EUA). This EUA will remain in effect (meaning this test can be used) for the  duration of the COVID-19 declaration under Section 564(b)(1) of the Act, 21 U.S.C. section 360bbb-3(b)(1), unless the authorization is terminated or revoked.  Performed at [facility], [address] [place], [place] [address]   Urine Culture     Status: Abnormal   Collection Time: 05/22/21  1:20 PM   Specimen: Urine, Clean Catch  Result Value Ref Range Status   Specimen Description   Final    URINE, CLEAN CATCH Performed at [facility], [address], [place], [place] [address]    Special Requests   Final    NONE Performed at [facility], [address] [place], [place] [address]    Culture MULTIPLE SPECIES PRESENT, SUGGEST RECOLLECTION (A)  Final   Report Status 05/24/2021 FINAL  Final         Radiology Studies: US RENAL  Result Date: 05/22/2021 CLINICAL DATA:  Acute kidney injury, stage IV chronic kidney disease, CHF, diabetes mellitus, hypertension EXAM: RENAL / URINARY TRACT ULTRASOUND COMPLETE COMPARISON:  12/21/2019 FINDINGS: Right Kidney: Renal measurements: 11.4 x 4.0 x 4.8 cm = volume: 112 mL. Cortical thinning. Normal cortical echogenicity. No mass or hydronephrosis. No definite shadowing calcifications. Left Kidney: Renal measurements: 10.7 x 5.2 x 4.5 cm = volume: 131 mL. Cortical thinning. Grossly normal echogenicity. Small cyst mid kidney 19 x 18 x 15 mm. No additional mass or hydronephrosis. No shadowing calcifications. Bladder: Decompressed by catheter, unable to evaluate. Other: N/A IMPRESSION: Cortical atrophy of both kidneys without hydronephrosis. Small mid LEFT renal cyst 19 mm greatest diameter. Electronically Signed   By: Lavonia Dana M.D.   On: 05/22/2021 17:53   ECHOCARDIOGRAM COMPLETE  Result Date: 05/23/2021    ECHOCARDIOGRAM REPORT   Patient Name:   Ann Flynn Date of Exam: 05/23/2021 Medical Rec #:  [id number]       Height:       65.0 in Accession #:    [id number]      Weight:       244.9 lb Date of Birth:   1941-01-23       BSA:          2.156 m Patient Age:    5 years        BP:           138/58 mmHg Patient Gender: F               HR:           64 bpm. Exam Location:  [facility] Procedure: 2D Echo, Cardiac Doppler and Color Doppler Indications:     CHF-acute diastolic E42.35  History:         Patient has prior history of Echocardiogram examinations, most                  recent 12/05/2020. CHF, Arrythmias:Atrial Fibrillation; Risk                  Factors:Hypertension and Diabetes. CKD.  Sonographer:     Sherrie Sport Referring Phys:  [id number] [facility] Diagnosing Phys: Yolonda Kida MD  Sonographer Comments: Suboptimal parasternal window and suboptimal apical window. IMPRESSIONS  1. Left ventricular ejection fraction, by estimation, is 50 to 55%. The left ventricle has low normal function. The left ventricle has no regional wall motion abnormalities. There is moderate left ventricular hypertrophy. Left ventricular diastolic parameters were normal.  2. Right ventricular systolic function is normal. The right ventricular size is normal.  3. The mitral valve is normal in structure. Trivial mitral valve regurgitation.  4. The aortic valve is normal in structure. Aortic valve regurgitation is not visualized. FINDINGS  Left Ventricle: Left ventricular ejection fraction, by estimation, is 50 to 55%. The left ventricle has low normal function. The left ventricle has no regional wall motion abnormalities. The left ventricular internal cavity size was normal in size. There is moderate left ventricular hypertrophy. Left ventricular diastolic parameters were normal. Right Ventricle: The right ventricular size is normal. No increase in right ventricular wall thickness. Right ventricular systolic function is normal. Left Atrium: Left atrial size was normal in size. Right Atrium: Right atrial size was normal in size. Pericardium: There is no evidence of pericardial effusion. Mitral Valve: The mitral valve is normal in structure.  Trivial mitral valve regurgitation. MV peak gradient, 6.4 mmHg. The mean mitral valve gradient is 2.0  mmHg. Tricuspid Valve: The tricuspid valve is normal in structure. Tricuspid valve regurgitation is mild. Aortic Valve: The aortic valve is normal in structure. Aortic valve regurgitation is not visualized. Aortic valve mean gradient measures 8.0 mmHg. Aortic valve peak gradient measures 13.7 mmHg. Aortic valve area, by VTI measures 1.85 cm. Pulmonic Valve: The pulmonic valve was normal in structure. Pulmonic valve regurgitation is not visualized. Aorta: The ascending aorta was not well visualized. IAS/Shunts: No atrial level shunt detected by color flow Doppler.  LEFT VENTRICLE PLAX 2D LVIDd:         4.70 cm   Diastology LVIDs:         3.40 cm   LV e' medial:    5.66 cm/s LV PW:         1.90 cm   LV E/e' medial:  19.4 LV IVS:        1.30 cm   LV e' lateral:   7.40 cm/s LVOT diam:     2.00 cm   LV E/e' lateral: 14.9 LV SV:         65 LV SV Index:   30 LVOT Area:     3.14 cm  RIGHT VENTRICLE RV S prime:     12.50 cm/s TAPSE (M-mode): 4.5 cm LEFT ATRIUM           Index        RIGHT ATRIUM           Index LA diam:      3.80 cm 1.76 cm/m   RA Area:     28.20 cm LA Vol (A2C): 46.1 ml 21.38 ml/m  RA Volume:   101.00 ml 46.84 ml/m LA Vol (A4C): 54.7 ml 25.37 ml/m  AORTIC VALVE                     PULMONIC VALVE AV Area (Vmax):    1.54 cm      PV Vmax:        0.68 m/s AV Area (Vmean):   1.46 cm      PV Peak grad:   1.8 mmHg AV Area (VTI):     1.85 cm      RVOT Peak grad: 4 mmHg AV Vmax:           185.33 cm/s AV Vmean:          131.333 cm/s AV VTI:            0.352 m AV Peak Grad:      13.7 mmHg AV Mean Grad:      8.0 mmHg LVOT Vmax:         90.70 cm/s LVOT Vmean:        61.200 cm/s LVOT VTI:          0.207 m LVOT/AV VTI ratio: 0.59  AORTA Ao Root diam: 3.50 cm MITRAL VALVE                TRICUSPID VALVE MV Area (PHT): 2.85 cm     TR Peak grad:   32.3 mmHg MV Area VTI:   1.92 cm     TR Vmax:        284.00 cm/s MV  Peak grad:  6.4 mmHg MV Mean grad:  2.0 mmHg     SHUNTS MV Vmax:       1.26 m/s     Systemic VTI:  0.21 m MV Vmean:      59.3 cm/s    Systemic Diam:  2.00 cm MV Decel Time: 266 msec MV E velocity: 110.00 cm/s MV A velocity: 110.00 cm/s MV E/A ratio:  1.00 Dwayne D Callwood MD Electronically signed by Yolonda Kida MD Signature Date/Time: 05/23/2021/7:38:50 PM    Final         Scheduled Meds:  ambrisentan  5 mg Oral Daily   apixaban  2.5 mg Oral BID   Chlorhexidine Gluconate Cloth  6 each Topical Daily   influenza vaccine adjuvanted  0.5 mL Intramuscular Tomorrow-1000   insulin aspart  0-15 Units Subcutaneous TID WC   insulin aspart  0-5 Units Subcutaneous QHS   insulin aspart  4 Units Subcutaneous TID WC   insulin glargine-yfgn  15 Units Subcutaneous Daily   mouth rinse  15 mL Mouth Rinse BID   methocarbamol  750 mg Oral TID   metoprolol succinate  12.5 mg Oral q AM   pantoprazole  40 mg Oral Daily   venlafaxine  75 mg Oral q morning   Continuous Infusions:  levofloxacin (LEVAQUIN) IV 500 mg (05/24/21 0847)     LOS: 3 days    Time spent: 25 minutes    Sidney Ace, MD [facility] Pager [phone]  If 7PM-7AM, please contact night-coverage 05/24/2021, 11:30 AM

## 2021-05-24 NOTE — Progress Notes (Addendum)
[facility]  ROUNDING NOTE   Subjective:   [name] was admitted to [facility] on 05/23/2021 for Weakness [R53.1] AKI (acute kidney injury) [facility]) [N17.9] Acute pain of left knee [M25.562] Acute renal insufficiency [N28.9]  Patient was last seen in the office by my partner, Dr. Candiss Norse, on 9/15. Her creatinine was 1.83 with GFR of 28. Patient she was in her usual state of health at that time. She was compliant and taking all her medications.   Patient was originally seen in the ED on 10/1 where she was given topical treatments for her neck strain and knee pain. She states these only helped a little. Patient returned on 10/3 with hypotension, acute kidney injury and leukocytosis. She was then started on IV fluids. No contrast exposure.   Patient laying in bed Appears more comfortable than yesterday Reports tolerating meals Denies nausea and vomiting States breathing is better, weaned to 4L   Objective:  Vital signs in last 24 hours:  Temp:  [97.5 F (36.4 C)-97.9 F (36.6 C)] 97.9 F (36.6 C) (10/07 0801) Pulse Rate:  [55-94] 64 (10/07 0801) Resp:  [16-20] 16 (10/07 0801) BP: (120-135)/(48-77) 124/59 (10/07 0801) SpO2:  [92 %-98 %] 92 % (10/07 1054)  Weight change:  Filed Weights   05/30/2021 2044 05/21/21 0603  Weight: 106.6 kg 111.1 kg    Intake/Output: I/O last 3 completed shifts: In: 960 [P.O.:960] Out: 2940 [Urine:2940]   Intake/Output this shift:  No intake/output data recorded.  Physical Exam: General: NAD, laying in bed  Head: Dry oral mucosa  Eyes: Anicteric  Lungs:  Basilar crackles, [place] O2 4L  Heart: Irregular rhythm  Abdomen:  Soft, nontender  Extremities:  1+ peripheral edema.  Neurologic: Nonfocal, able to answer questions  Skin: No lesions        Basic Metabolic Panel: Recent Labs  Lab 06/17/2021 2222 05/21/21 0340 05/22/21 0523 05/23/21 0903 05/24/21 0524  NA 133* 134* 130* 129* 129*  K 4.8 4.5 5.0 4.9 4.5  CL 98 102 98 94*  94*  CO2 _0 GLUCOSE 261* 252* 221* 317* 295*  BUN 73* 73* 87* 111* 131*  CREATININE 2.95* 2.75* 2.98* 2.96* 2.82*  CALCIUM 8.7* 7.9* 8.2* 8.6* 8.6*     Liver Function Tests: Recent Labs  Lab 06/10/2021 2222 05/22/21 0523  AST 20 18  ALT 13 10  ALKPHOS 75 69  BILITOT 1.3* 1.3*  PROT 6.0* 5.6*  ALBUMIN 3.0* 2.2*    No results for input(s): LIPASE, AMYLASE in the last 168 hours. No results for input(s): AMMONIA in the last 168 hours.  CBC: Recent Labs  Lab 05/18/21 1402 05/19/2021 2222 05/22/21 0523 05/23/21 0903  WBC 10.5 18.0* 15.3* 14.7*  NEUTROABS  --  16.4*  --  13.4*  HGB 8.9* 9.8* 8.5* 9.3*  HCT 29.3* 31.2* 27.3* 28.4*  MCV 100.0 98.1 96.1 94.0  PLT 214 225 224 231     Cardiac Enzymes: Recent Labs  Lab 05/21/2021 2222  CKTOTAL 66     BNP: Invalid input(s): POCBNP  CBG: Recent Labs  Lab 05/23/21 0800 05/23/21 1124 05/23/21 1624 05/23/21 2041 05/24/21 0758  GLUCAP 286* 319* 329* 292* 283*     Microbiology: Results for orders placed or performed during the hospital encounter of 06/02/2021  Resp Panel by RT-PCR (Flu A&B, Covid) Nasopharyngeal Swab     Status: None   Collection Time: 06/14/2021 10:22 PM   Specimen: Nasopharyngeal Swab; Nasopharyngeal(NP) swabs in vial transport  medium  Result Value Ref Range Status   SARS Coronavirus 2 by RT PCR NEGATIVE NEGATIVE Final    Comment: (NOTE) SARS-CoV-2 target nucleic acids are NOT DETECTED.  The SARS-CoV-2 RNA is generally detectable in upper respiratory specimens during the acute phase of infection. The lowest concentration of SARS-CoV-2 viral copies this assay can detect is 138 copies/mL. A negative result does not preclude SARS-Cov-2 infection and should not be used as the sole basis for treatment or other patient management decisions. A negative result may occur with  improper specimen collection/handling, submission of specimen other than nasopharyngeal swab, presence of viral  mutation(s) within the areas targeted by this assay, and inadequate number of viral copies(<138 copies/mL). A negative result must be combined with clinical observations, patient history, and epidemiological information. The expected result is Negative.  Fact Sheet for Patients:  EntrepreneurPulse.com.au  Fact Sheet for Healthcare Providers:  IncredibleEmployment.be  This test is no t yet approved or cleared by the [place] FDA and  has been authorized for detection and/or diagnosis of SARS-CoV-2 by FDA under an Emergency Use Authorization (EUA). This EUA will remain  in effect (meaning this test can be used) for the duration of the COVID-19 declaration under Section 564(b)(1) of the Act, 21 U.S.C.section 360bbb-3(b)(1), unless the authorization is terminated  or revoked sooner.       Influenza A by PCR NEGATIVE NEGATIVE Final   Influenza B by PCR NEGATIVE NEGATIVE Final    Comment: (NOTE) The Xpert Xpress SARS-CoV-2/FLU/RSV plus assay is intended as an aid in the diagnosis of influenza from Nasopharyngeal swab specimens and should not be used as a sole basis for treatment. Nasal washings and aspirates are unacceptable for Xpert Xpress SARS-CoV-2/FLU/RSV testing.  Fact Sheet for Patients: EntrepreneurPulse.com.au  Fact Sheet for Healthcare Providers: IncredibleEmployment.be  This test is not yet approved or cleared by the [place] FDA and has been authorized for detection and/or diagnosis of SARS-CoV-2 by FDA under an Emergency Use Authorization (EUA). This EUA will remain in effect (meaning this test can be used) for the duration of the COVID-19 declaration under Section 564(b)(1) of the Act, 21 U.S.C. section 360bbb-3(b)(1), unless the authorization is terminated or revoked.  Performed at [facility], [address], [place], [place] [address]   Urine Culture     Status:  Abnormal   Collection Time: 05/22/21  1:20 PM   Specimen: Urine, Clean Catch  Result Value Ref Range Status   Specimen Description   Final    URINE, CLEAN CATCH Performed at [facility], [address], [place], [place] [address]    Special Requests   Final    NONE Performed at [facility], [address] [place], [place] [address]    Culture MULTIPLE SPECIES PRESENT, SUGGEST RECOLLECTION (A)  Final   Report Status 05/24/2021 FINAL  Final    Coagulation Studies: No results for input(s): LABPROT, INR in the last 72 hours.  Urinalysis: Recent Labs    05/22/21 1215  COLORURINE YELLOW*  LABSPEC 1.014  PHURINE 5.0  GLUCOSEU NEGATIVE  HGBUR LARGE*  BILIRUBINUR NEGATIVE  KETONESUR NEGATIVE  PROTEINUR 100*  NITRITE NEGATIVE  LEUKOCYTESUR MODERATE*       Imaging: US RENAL  Result Date: 05/22/2021 CLINICAL DATA:  Acute kidney injury, stage IV chronic kidney disease, CHF, diabetes mellitus, hypertension EXAM: RENAL / URINARY TRACT ULTRASOUND COMPLETE COMPARISON:  12/21/2019 FINDINGS: Right Kidney: Renal measurements: 11.4 x 4.0 x 4.8 cm = volume: 112 mL. Cortical thinning.  Normal cortical echogenicity. No mass or hydronephrosis. No definite shadowing calcifications. Left Kidney: Renal measurements: 10.7 x 5.2 x 4.5 cm = volume: 131 mL. Cortical thinning. Grossly normal echogenicity. Small cyst mid kidney 19 x 18 x 15 mm. No additional mass or hydronephrosis. No shadowing calcifications. Bladder: Decompressed by catheter, unable to evaluate. Other: N/A IMPRESSION: Cortical atrophy of both kidneys without hydronephrosis. Small mid LEFT renal cyst 19 mm greatest diameter. Electronically Signed   By: Lavonia Dana M.D.   On: 05/22/2021 17:53   ECHOCARDIOGRAM COMPLETE  Result Date: 05/23/2021    ECHOCARDIOGRAM REPORT   Patient Name:   Ann Flynn Date of Exam: 05/23/2021 Medical Rec #:  [id number]       Height:       65.0 in Accession #:    [id number]      Weight:        244.9 lb Date of Birth:  [birth year]       BSA:          2.156 m Patient Age:    54 years        BP:           138/58 mmHg Patient Gender: F               HR:           64 bpm. Exam Location:  [facility] Procedure: 2D Echo, Cardiac Doppler and Color Doppler Indications:     CHF-acute diastolic O75.64  History:         Patient has prior history of Echocardiogram examinations, most                  recent 12/05/2020. CHF, Arrythmias:Atrial Fibrillation; Risk                  Factors:Hypertension and Diabetes. CKD.  Sonographer:     Sherrie Sport Referring Phys:  [id number] [facility] Diagnosing Phys: Yolonda Kida MD  Sonographer Comments: Suboptimal parasternal window and suboptimal apical window. IMPRESSIONS  1. Left ventricular ejection fraction, by estimation, is 50 to 55%. The left ventricle has low normal function. The left ventricle has no regional wall motion abnormalities. There is moderate left ventricular hypertrophy. Left ventricular diastolic parameters were normal.  2. Right ventricular systolic function is normal. The right ventricular size is normal.  3. The mitral valve is normal in structure. Trivial mitral valve regurgitation.  4. The aortic valve is normal in structure. Aortic valve regurgitation is not visualized. FINDINGS  Left Ventricle: Left ventricular ejection fraction, by estimation, is 50 to 55%. The left ventricle has low normal function. The left ventricle has no regional wall motion abnormalities. The left ventricular internal cavity size was normal in size. There is moderate left ventricular hypertrophy. Left ventricular diastolic parameters were normal. Right Ventricle: The right ventricular size is normal. No increase in right ventricular wall thickness. Right ventricular systolic function is normal. Left Atrium: Left atrial size was normal in size. Right Atrium: Right atrial size was normal in size. Pericardium: There is no evidence of pericardial effusion. Mitral Valve: The mitral  valve is normal in structure. Trivial mitral valve regurgitation. MV peak gradient, 6.4 mmHg. The mean mitral valve gradient is 2.0 mmHg. Tricuspid Valve: The tricuspid valve is normal in structure. Tricuspid valve regurgitation is mild. Aortic Valve: The aortic valve is normal in structure. Aortic valve regurgitation is not visualized. Aortic valve mean gradient measures 8.0 mmHg. Aortic valve peak gradient measures 13.7 mmHg. Aortic  valve area, by VTI measures 1.85 cm. Pulmonic Valve: The pulmonic valve was normal in structure. Pulmonic valve regurgitation is not visualized. Aorta: The ascending aorta was not well visualized. IAS/Shunts: No atrial level shunt detected by color flow Doppler.  LEFT VENTRICLE PLAX 2D LVIDd:         4.70 cm   Diastology LVIDs:         3.40 cm   LV e' medial:    5.66 cm/s LV PW:         1.90 cm   LV E/e' medial:  19.4 LV IVS:        1.30 cm   LV e' lateral:   7.40 cm/s LVOT diam:     2.00 cm   LV E/e' lateral: 14.9 LV SV:         65 LV SV Index:   30 LVOT Area:     3.14 cm  RIGHT VENTRICLE RV S prime:     12.50 cm/s TAPSE (M-mode): 4.5 cm LEFT ATRIUM           Index        RIGHT ATRIUM           Index LA diam:      3.80 cm 1.76 cm/m   RA Area:     28.20 cm LA Vol (A2C): 46.1 ml 21.38 ml/m  RA Volume:   101.00 ml 46.84 ml/m LA Vol (A4C): 54.7 ml 25.37 ml/m  AORTIC VALVE                     PULMONIC VALVE AV Area (Vmax):    1.54 cm      PV Vmax:        0.68 m/s AV Area (Vmean):   1.46 cm      PV Peak grad:   1.8 mmHg AV Area (VTI):     1.85 cm      RVOT Peak grad: 4 mmHg AV Vmax:           185.33 cm/s AV Vmean:          131.333 cm/s AV VTI:            0.352 m AV Peak Grad:      13.7 mmHg AV Mean Grad:      8.0 mmHg LVOT Vmax:         90.70 cm/s LVOT Vmean:        61.200 cm/s LVOT VTI:          0.207 m LVOT/AV VTI ratio: 0.59  AORTA Ao Root diam: 3.50 cm MITRAL VALVE                TRICUSPID VALVE MV Area (PHT): 2.85 cm     TR Peak grad:   32.3 mmHg MV Area VTI:   1.92 cm      TR Vmax:        284.00 cm/s MV Peak grad:  6.4 mmHg MV Mean grad:  2.0 mmHg     SHUNTS MV Vmax:       1.26 m/s     Systemic VTI:  0.21 m MV Vmean:      59.3 cm/s    Systemic Diam: 2.00 cm MV Decel Time: 266 msec MV E velocity: 110.00 cm/s MV A velocity: 110.00 cm/s MV E/A ratio:  1.00 Dwayne Prince Rome MD Electronically signed by Yolonda Kida MD Signature Date/Time: 05/23/2021/7:38:50 PM    Final  Medications:    levofloxacin (LEVAQUIN) IV 500 mg (05/24/21 0847)    ambrisentan  5 mg Oral Daily   apixaban  2.5 mg Oral BID   Chlorhexidine Gluconate Cloth  6 each Topical Daily   influenza vaccine adjuvanted  0.5 mL Intramuscular Tomorrow-1000   insulin aspart  0-15 Units Subcutaneous TID WC   insulin aspart  0-5 Units Subcutaneous QHS   insulin aspart  4 Units Subcutaneous TID WC   insulin glargine-yfgn  15 Units Subcutaneous Daily   mouth rinse  15 mL Mouth Rinse BID   methocarbamol  750 mg Oral TID   metoprolol succinate  12.5 mg Oral q AM   pantoprazole  40 mg Oral Daily   venlafaxine  75 mg Oral q morning   acetaminophen **OR** acetaminophen, albuterol, ALPRAZolam, morphine injection, ondansetron **OR** ondansetron (ZOFRAN) IV  Assessment/ Plan:  [name] is a 80 y.o. white female with diabetes mellitus type II insulin dependent, atrial fibrillation, diastolic congestive heart failure, pulmonary hypertension, sleep apnea, history of PE, who is admitted to [facility] on 05/24/2021 for Weakness [R53.1] AKI (acute kidney injury) ([place]) [N17.9] Acute pain of left knee [M25.562] Acute renal insufficiency [N28.9]  Acute kidney injury with hyponatremia on chronic kidney disease stage IV with proteinuria: baseline creatinine 1.83, GFR of 28 on 05/01/2021. Chronic kidney disease secondary to diabetic nephropathy. Acute kidney injury secondary to obstructive uropathy and acute cardiorenal syndrome. No IV contrast exposure.  -Foley catheter placed, UOP 1.7L in 24 hours - Renal  ultrasound shows bilateral kidney cortical atrophy, no obstruction, small left renal cyst - holding losartan.  - Creatinine improved. BUN increasing believed due to over diuresis. Will discontinue diuretic.  - Sodium 129, will monitor  Hypertension: stable 124/59. Prescribed Ambrisentan and metoprolol. Holding home regimen of losartan.   Acute exacerbation of chronic diastolic congestive heart failure:  - Echo shows EF 50-55% - diuretic discontinued due to elevated BUN, will monitor volume status  Anemia with chronic kidney disease: normocytic, hemoglobin 8.5. Labs consistent with iron deficiency on 7/25.  - Iron studies remain consistent with iron deficiency and awaiting SPEP/UPEP.   Diabetes mellitus type II with chronic kidney disease: insulin dependent: hemoglobin A1c of 7.9%.  - continue glucose control.    LOS: 3   10/7/202211:42 AM

## 2021-05-24 NOTE — TOC Progression Note (Signed)
Transition of Care [facility]) - Progression Note    Patient Details  Name: Ann Flynn MRN: [id number] Date of Birth: Oct 03, 1940  Transition of [facility]) CM/SW Miguel Barrera, LCSW Phone Number: 05/24/2021, 3:11 PM  Clinical Narrative:   Call to son Ann Flynn per RN request. Answered questions regarding SNF, insurance auth, etc. Will start auth when patient is close to being medically ready for SNF. Per MD, not medically ready over the weekend.   Expected Discharge Plan: [place] Barriers to Discharge: Continued Medical Work up  Expected Discharge Plan and Services Expected Discharge Plan: [place] Choice: [place] arrangements for the past 2 months: Single Family Home                                       Social Determinants of Health (SDOH) Interventions    Readmission Risk Interventions No flowsheet data found.

## 2021-05-24 NOTE — Progress Notes (Signed)
Physical Therapy Treatment Patient Details Name: Ann Flynn MRN: [id number] DOB: [birth year] Today's Date: 05/24/2021   History of Present Illness 80 year old female with history of diastolic CHF, moderate pulmonary hypertension, IDDM 2, CKD stage IV, OSA on CPAP, a flutter s/p cardioversion on 4/22 on Eliquis.  She presented to the ED for the second time in 3 days with complaints of neck pain and knee pain with difficulty bearing weight as well as generalized weakness    PT Comments    Patient received in bed, RN present in room at arrival. Family members at bedside. Patient is mod independent with bed mobility, transfers with min assist, increased time and effort. She is able to take a few steps along edge of bed and then pivoting steps to recliner with min/mod assist and RW. Patient progressing and reporting no pain, decreased anxiety level this session. Patient will continue to benefit from skilled PT while here to improve functional independence and safety.        Recommendations for follow up therapy are one component of a multi-disciplinary discharge planning process, led by the attending physician.  Recommendations may be updated based on patient status, additional functional criteria and insurance authorization.  Follow Up Recommendations  SNF;Supervision/Assistance - 24 hour     Equipment Recommendations  Other (comment) (TBD)    Recommendations for Other Services       Precautions / Restrictions Precautions Precautions: Fall Precaution Comments: less anxious this date, confused, tremulous Restrictions Weight Bearing Restrictions: No     Mobility  Bed Mobility Overal bed mobility: Modified Independent Bed Mobility: Supine to Sit     Supine to sit: Min guard;HOB elevated     General bed mobility comments: heavy use of rails, no physical assist. Cues needed to scoot forward    Transfers Overall transfer level: Needs assistance Equipment used: Rolling walker  (2 wheeled) Transfers: Sit to/from [facility] Sit to Stand: Min assist Stand pivot transfers: Mod assist       General transfer comment: Patient able to stand 2x from bed with min assist. Cues for upright posture and correct hand placement on walker.  Ambulation/Gait Ambulation/Gait assistance: Mod assist Gait Distance (Feet): 5 Feet Assistive device: Rolling walker (2 wheeled) Gait Pattern/deviations: Step-to pattern;Decreased step length - right;Decreased step length - left;Shuffle;Trunk flexed Gait velocity: decr   General Gait Details: patient able to shuffle step up along edge of bed and then to recliner. Requires cues and min/mod assist for safety and to move RW   Stairs             Wheelchair Mobility    Modified Rankin (Stroke Patients Only)       Balance Overall balance assessment: Needs assistance Sitting-balance support: Feet supported Sitting balance-Leahy Scale: Good     Standing balance support: Bilateral upper extremity supported;During functional activity Standing balance-Leahy Scale: Poor Standing balance comment: Reliant on B UE support, min/mod assist                            Cognition Arousal/Alertness: Awake/alert Behavior During Therapy: WFL for tasks assessed/performed;Flat affect Overall Cognitive Status: Impaired/Different from baseline Area of Impairment: Orientation;Following commands;Safety/judgement;Problem solving;Memory                 Orientation Level: Situation;Time;Place;Disoriented to     Following Commands: Follows one step commands with increased time     Problem Solving: Slow processing;Decreased initiation;Requires verbal cues;Requires tactile cues;Difficulty sequencing General Comments:  patient with decreased anxiety this session, was pre-medicated. She is slightly confused, slow to respond      Exercises Other Exercises Other Exercises: Seated LAQ x 6 reps each    General  Comments        Pertinent Vitals/Pain Pain Assessment: Faces Faces Pain Scale: No hurt Pain Intervention(s): Monitored during session;Premedicated before session    Home Living                      Prior Function            PT Goals (current goals can now be found in the care plan section) Acute Rehab PT Goals Patient Stated Goal: to get stronger PT Goal Formulation: With patient/family Time For Goal Achievement: 06/04/21 Potential to Achieve Goals: Good Progress towards PT goals: Progressing toward goals    Frequency    Min 2X/week      PT Plan Current plan remains appropriate    Co-evaluation              AM-PAC PT "6 Clicks" Mobility   Outcome Measure  Help needed turning from your back to your side while in a flat bed without using bedrails?: A Lot Help needed moving from lying on your back to sitting on the side of a flat bed without using bedrails?: A Lot Help needed moving to and from a bed to a chair (including a wheelchair)?: A Lot Help needed standing up from a chair using your arms (e.g., wheelchair or bedside chair)?: A Lot Help needed to walk in hospital room?: Total Help needed climbing 3-5 steps with a railing? : Total 6 Click Score: 10    End of Session Equipment Utilized During Treatment: Gait belt Activity Tolerance: Patient limited by fatigue Patient left: in chair;with call bell/phone within reach;with family/visitor present Nurse Communication: Mobility status;Other (comment) (Alarm pad in recliner, no alarm box in room, NT notified) PT Visit Diagnosis: Muscle weakness (generalized) (M62.81);Unsteadiness on feet (R26.81);Other abnormalities of gait and mobility (R26.89);Difficulty in walking, not elsewhere classified (R26.2)     Time: 1027-1050 PT Time Calculation (min) (ACUTE ONLY): 23 min  Charges:  $Therapeutic Exercise: 23-37 mins                     Dailyn Reith, PT, GCS 05/24/21,11:04 AM

## 2021-05-25 ENCOUNTER — Other Ambulatory Visit: Payer: Self-pay

## 2021-05-25 DIAGNOSIS — N189 Chronic kidney disease, unspecified: Secondary | ICD-10-CM | POA: Diagnosis not present

## 2021-05-25 DIAGNOSIS — N179 Acute kidney failure, unspecified: Secondary | ICD-10-CM | POA: Diagnosis not present

## 2021-05-25 LAB — BASIC METABOLIC PANEL
Anion gap: 12 (ref 5–15)
BUN: 136 mg/dL — ABNORMAL HIGH (ref 8–23)
CO2: 23 mmol/L (ref 22–32)
Calcium: 8.4 mg/dL — ABNORMAL LOW (ref 8.9–10.3)
Chloride: 96 mmol/L — ABNORMAL LOW (ref 98–111)
Creatinine, Ser: 2.62 mg/dL — ABNORMAL HIGH (ref 0.44–1.00)
GFR, Estimated: 18 mL/min — ABNORMAL LOW (ref >60)
Glucose, Bld: 150 mg/dL — ABNORMAL HIGH (ref 70–99)
Potassium: 3.9 mmol/L (ref 3.5–5.1)
Sodium: 131 mmol/L — ABNORMAL LOW (ref 135–145)

## 2021-05-25 LAB — CBC WITH DIFFERENTIAL/PLATELET
Abs Immature Granulocytes: 0.27 10*3/uL — ABNORMAL HIGH (ref 0.00–0.07)
Basophils Absolute: 0 10*3/uL (ref 0.0–0.1)
Basophils Relative: 0 %
Eosinophils Absolute: 0 10*3/uL (ref 0.0–0.5)
Eosinophils Relative: 0 %
HCT: 30 % — ABNORMAL LOW (ref 36.0–46.0)
Hemoglobin: 9.8 g/dL — ABNORMAL LOW (ref 12.0–15.0)
Immature Granulocytes: 2 %
Lymphocytes Relative: 8 %
Lymphs Abs: 1 10*3/uL (ref 0.7–4.0)
MCH: 30.2 pg (ref 26.0–34.0)
MCHC: 32.7 g/dL (ref 30.0–36.0)
MCV: 92.3 fL (ref 80.0–100.0)
Monocytes Absolute: 1.1 10*3/uL — ABNORMAL HIGH (ref 0.1–1.0)
Monocytes Relative: 9 %
Neutro Abs: 10.1 10*3/uL — ABNORMAL HIGH (ref 1.7–7.7)
Neutrophils Relative %: 81 %
Platelets: 283 10*3/uL (ref 150–400)
RBC: 3.25 MIL/uL — ABNORMAL LOW (ref 3.87–5.11)
RDW: 15.5 % (ref 11.5–15.5)
WBC: 12.4 10*3/uL — ABNORMAL HIGH (ref 4.0–10.5)
nRBC: 0 % (ref 0.0–0.2)

## 2021-05-25 LAB — GLUCOSE, CAPILLARY
Glucose-Capillary: 149 mg/dL — ABNORMAL HIGH (ref 70–99)
Glucose-Capillary: 107 mg/dL — ABNORMAL HIGH (ref 70–99)
Glucose-Capillary: 94 mg/dL (ref 70–99)
Glucose-Capillary: 147 mg/dL — ABNORMAL HIGH (ref 70–99)

## 2021-05-25 LAB — MAGNESIUM: Magnesium: 2.1 mg/dL (ref 1.7–2.4)

## 2021-05-25 LAB — TROPONIN I (HIGH SENSITIVITY): Troponin I (High Sensitivity): 113 ng/L (ref <18)

## 2021-05-25 MED ORDER — ALBUMIN HUMAN 25 % IV SOLN
12.5000 g | Freq: Once | INTRAVENOUS | Status: AC
  Administered 2021-05-25: 12.5 g via INTRAVENOUS
  Filled 2021-05-25: qty 50

## 2021-05-25 MED ORDER — HYDROMORPHONE HCL 1 MG/ML IJ SOLN
0.5000 mg | INTRAMUSCULAR | Status: DC | PRN
  Administered 2021-05-25 – 2021-05-26 (×6): 0.5 mg via INTRAVENOUS
  Filled 2021-05-25: qty 0.5
  Filled 2021-05-25: qty 1
  Filled 2021-05-25 (×4): qty 0.5

## 2021-05-25 MED ORDER — OXYCODONE HCL 5 MG PO TABS: 5.0000 mg | ORAL_TABLET | ORAL | Status: DC | PRN

## 2021-05-25 NOTE — Progress Notes (Addendum)
PROGRESS NOTE    Ann Flynn  YDX:[id number] DOB: 1940/09/22 DOA: 05/19/2021 PCP: Birdie Sons, MD   Brief Narrative:   80 y.o. female with medical history significant for diastolic CHF, moderate pulmonary hypertension, IDDM 2, CKD stage IV, OSA on CPAP, , a flutter s/p cardioversion on 4/22 on Eliquis and amiodarone, who presents to the ED for the second time in 3 days with complaints of neck pain and knee pain with difficulty bearing weight and difficulty moving around the house where she lives alone with a daughter that lives nearby.  She denies other complaints.  On her first visit to the ED on 10/1 she had an essentially negative work-up and was discharged with Voltaren gel and a Lidoderm patch for suspected neck strain.  She was brought back to the emergency room on 10/3 because family noted her to be increasingly weak, at this time not explained by her neck pain or knee pain.  She had no cough chest pain or shortness of breath, no nausea or vomiting, but family is concerned for her oral intake given decreasing ability to care for herself at home  10/5: Called to bedside by RN this morning.  Patient with increased work of breathing and increasingly somnolent.  Evaluated bedside.  Rhonchorous breath sounds.  Vital signs stable.  3 L..  Blood gas reassuring.  10/6: Foley placed yesterday.  1400 cc urine liberated.  Patient net -1500cc.  Respiratory status remains tenuous.  Mentation improved  10/8: Patient remains very fatigued.  Continues to endorse severe neck pain.  Urine output adequate.  Oxygenation status unchanged.  Vital signs remained stable.   Assessment & Plan:   Principal Problem:   Acute kidney injury superimposed on CKD IV (HCC) Active Problems:   OSA on CPAP   Chronic pulmonary embolism, unspecified pulmonary embolism type, unspecified whether acute cor pulmonale present (HCC)   Morbid (severe) obesity due to excess calories (HCC)   Atrial fibrillation, chronic  (HCC)   Diabetes mellitus, type II ([place])   Pulmonary hypertension (HCC)   Neck pain   Effusion of left knee   Hyponatremia   Anemia in chronic kidney disease (CKD)   Acute renal insufficiency  AKI on CKD stage IV Likely multifactorial Initially concern for ATN versus prerenal azotemia Now likely element of cardiorenal syndrome Renal US, cortical atrophy without hydronephrosis Nephrology following, recommendations appreciated Loop diuretics held on 10/7 due to worsening azotemia Plan: Diuretics currently on hold Defer to nephrology regarding restart Daily renal function Appreciate nephrology follow-up  Acute on chronic diastolic congestive heart failure Initially not overloaded Received IV fluid hydration Creatinine continue to worsen Now suspect cardiorenal syndrome Plan: Diuretics on hold as above Daily weights Continue beta-blocker Hold ACE inhibitor  Neck pain Left knee effusion with ambulatory dysfunction Diagnostic x-rays negative for fracture Moderate effusion left knee Patient's main complaint is pain in the neck Plan: Continue 3 times daily Robaxin Added as needed oxycodone Low-dose IV Dilaudid for breakthrough not receptive to oral medications Avoid NSAIDs Therapy evaluations If pain persistent will consider orthopedic evaluation  Type 2 diabetes mellitus with hyperglycemia Semglee 15 units daily NovoLog 4 units 3 times daily with meals Moderate sliding scale  Chronic hypoxic respiratory failure Patient baseline 3 L oxygen Currently on this rate Increased work of breathing noted 10/5 Likely multifactorial etiology Plan: No further steroids Lasix on hold Empiric Levaquin for CAP coverage, 5-day course Vitals per unit protocol  Depression PTA Effexor  Paroxysmal atrial fibrillation Rate controlled Continue  Eliquis Continue metoprolol  OSA on nightly CPAP      DVT prophylaxis: Eliquis Code Status: Full Family Communication: 2 family  members at bedside this morning 10/7.  Left VM for daughter Derinda Sis [phone] on 10/8 Disposition Plan: Status is: Inpatient  Remains inpatient appropriate because:Inpatient level of care appropriate due to severity of illness  Dispo: The patient is from: Home              Anticipated d/c is to: SNF              Patient currently is not medically stable to d/c.   Difficult to place patient No       Level of care: Med-Surg  Consultants:  None  Procedures:  None  Antimicrobials:  Levofloxacin   Subjective: Seen and examined.  No mental status remained poor.  Work of breathing unchanged from prior.  Remains on 4 L.  Objective: Vitals:   05/24/21 1932 05/24/21 2305 05/25/21 0334 05/25/21 0816  BP: (!) 126/50  114/84 110/67  Pulse: 93  80 88  Resp: _0 Temp: 98 F (36.7 C)  98.1 F (36.7 C) (!) 97.5 F (36.4 C)  TempSrc: Oral  Oral Oral  SpO2: 95% 94% 93% 90%  Weight:      Height:        Intake/Output Summary (Last 24 hours) at 05/25/2021 1025 Last data filed at 05/25/2021 0359 Gross per 24 hour  Intake 190.18 ml  Output 1700 ml  Net -1509.82 ml   Filed Weights   05/30/2021 2044 05/21/21 0603  Weight: 106.6 kg 111.1 kg    Examination:  General exam: Appears fatigued dyspneic Respiratory system: Bibasilar crackles.  Increased work of breathing.  4 L Cardiovascular system: S1-S2, regular rate, irregular rhythm, no murmurs Gastrointestinal system: Obese, NT/ND, normal bowel sounds Central nervous system: Fatigue.  Oriented.  No focal deficits Extremities: Diffusely decreased power bilaterally Skin: No rashes, lesions or ulcers Psychiatry: Judgement and insight appear impaired. Mood & affect flattened.     Data Reviewed: I have personally reviewed following labs and imaging studies  CBC: Recent Labs  Lab 05/18/21 1402 06/04/2021 2222 05/22/21 0523 05/23/21 0903 05/25/21 0830  WBC 10.5 18.0* 15.3* 14.7* 12.4*  NEUTROABS  --  16.4*  --   13.4* 10.1*  HGB 8.9* 9.8* 8.5* 9.3* 9.8*  HCT 29.3* 31.2* 27.3* 28.4* 30.0*  MCV 100.0 98.1 96.1 94.0 92.3  PLT 214 225 224 231 573   Basic Metabolic Panel: Recent Labs  Lab 05/21/21 0340 05/22/21 0523 05/23/21 0903 05/24/21 0524 05/25/21 0830  NA 134* 130* 129* 129* 131*  K 4.5 5.0 4.9 4.5 3.9  CL 102 98 94* 94* 96*  CO2 _1 GLUCOSE 252* 221* 317* 295* 150*  BUN 73* 87* 111* 131* 136*  CREATININE 2.75* 2.98* 2.96* 2.82* 2.62*  CALCIUM 7.9* 8.2* 8.6* 8.6* 8.4*  MG  --   --   --   --  2.1   GFR: Estimated Creatinine Clearance: 21.3 mL/min (A) (by C-G formula based on SCr of 2.62 mg/dL (H)). Liver Function Tests: Recent Labs  Lab 06/15/2021 2222 05/22/21 0523  AST 20 18  ALT 13 10  ALKPHOS 75 69  BILITOT 1.3* 1.3*  PROT 6.0* 5.6*  ALBUMIN 3.0* 2.2*   No results for input(s): LIPASE, AMYLASE in the last 168 hours. No results for input(s): AMMONIA in the last 168 hours. Coagulation Profile: No results for input(s): INR,  PROTIME in the last 168 hours. Cardiac Enzymes: Recent Labs  Lab 05/31/2021 2222  CKTOTAL 66   BNP (last 3 results) No results for input(s): PROBNP in the last 8760 hours. HbA1C: No results for input(s): HGBA1C in the last 72 hours.  CBG: Recent Labs  Lab 05/24/21 0758 05/24/21 1145 05/24/21 1650 05/24/21 2112 05/25/21 0809  GLUCAP 283* 318* 170* 164* 149*   Lipid Profile: No results for input(s): CHOL, HDL, LDLCALC, TRIG, CHOLHDL, LDLDIRECT in the last 72 hours. Thyroid Function Tests: No results for input(s): TSH, T4TOTAL, FREET4, T3FREE, THYROIDAB in the last 72 hours. Anemia Panel: Recent Labs    05/22/21 1453  FOLATE 22.0  FERRITIN 294  TIBC 171*  IRON 10*   Sepsis Labs: No results for input(s): PROCALCITON, LATICACIDVEN in the last 168 hours.  Recent Results (from the past 240 hour(s))  Resp Panel by RT-PCR (Flu A&B, Covid) Nasopharyngeal Swab     Status: None   Collection Time: 06/16/2021 10:22 PM   Specimen:  Nasopharyngeal Swab; Nasopharyngeal(NP) swabs in vial transport medium  Result Value Ref Range Status   SARS Coronavirus 2 by RT PCR NEGATIVE NEGATIVE Final    Comment: (NOTE) SARS-CoV-2 target nucleic acids are NOT DETECTED.  The SARS-CoV-2 RNA is generally detectable in upper respiratory specimens during the acute phase of infection. The lowest concentration of SARS-CoV-2 viral copies this assay can detect is 138 copies/mL. A negative result does not preclude SARS-Cov-2 infection and should not be used as the sole basis for treatment or other patient management decisions. A negative result may occur with  improper specimen collection/handling, submission of specimen other than nasopharyngeal swab, presence of viral mutation(s) within the areas targeted by this assay, and inadequate number of viral copies(<138 copies/mL). A negative result must be combined with clinical observations, patient history, and epidemiological information. The expected result is Negative.  Fact Sheet for Patients:  EntrepreneurPulse.com.au  Fact Sheet for Healthcare Providers:  IncredibleEmployment.be  This test is no t yet approved or cleared by the [place] FDA and  has been authorized for detection and/or diagnosis of SARS-CoV-2 by FDA under an Emergency Use Authorization (EUA). This EUA will remain  in effect (meaning this test can be used) for the duration of the COVID-19 declaration under Section 564(b)(1) of the Act, 21 U.S.C.section 360bbb-3(b)(1), unless the authorization is terminated  or revoked sooner.       Influenza A by PCR NEGATIVE NEGATIVE Final   Influenza B by PCR NEGATIVE NEGATIVE Final    Comment: (NOTE) The Xpert Xpress SARS-CoV-2/FLU/RSV plus assay is intended as an aid in the diagnosis of influenza from Nasopharyngeal swab specimens and should not be used as a sole basis for treatment. Nasal washings and aspirates are unacceptable for  Xpert Xpress SARS-CoV-2/FLU/RSV testing.  Fact Sheet for Patients: EntrepreneurPulse.com.au  Fact Sheet for Healthcare Providers: IncredibleEmployment.be  This test is not yet approved or cleared by the [place] FDA and has been authorized for detection and/or diagnosis of SARS-CoV-2 by FDA under an Emergency Use Authorization (EUA). This EUA will remain in effect (meaning this test can be used) for the duration of the COVID-19 declaration under Section 564(b)(1) of the Act, 21 U.S.C. section 360bbb-3(b)(1), unless the authorization is terminated or revoked.  Performed at [facility], [address] [place], [place] [address]   Urine Culture     Status: Abnormal   Collection Time: 05/22/21  1:20 PM   Specimen: Urine, Clean Catch  Result Value Ref Range  Status   Specimen Description   Final    URINE, CLEAN CATCH Performed at [facility], [address], [place], [place] [address]    Special Requests   Final    NONE Performed at [facility], [address] [place], [place] [address]    Culture MULTIPLE SPECIES PRESENT, SUGGEST RECOLLECTION (A)  Final   Report Status 05/24/2021 FINAL  Final         Radiology Studies: No results found.      Scheduled Meds:  ambrisentan  5 mg Oral Daily   apixaban  2.5 mg Oral BID   Chlorhexidine Gluconate Cloth  6 each Topical Daily   influenza vaccine adjuvanted  0.5 mL Intramuscular Tomorrow-1000   insulin aspart  0-15 Units Subcutaneous TID WC   insulin aspart  0-5 Units Subcutaneous QHS   insulin aspart  4 Units Subcutaneous TID WC   insulin glargine-yfgn  15 Units Subcutaneous Daily   mouth rinse  15 mL Mouth Rinse BID   methocarbamol  750 mg Oral TID   metoprolol succinate  12.5 mg Oral q AM   pantoprazole  40 mg Oral Daily   venlafaxine  75 mg Oral q morning   Continuous Infusions:  levofloxacin (LEVAQUIN) IV Stopped (05/24/21 0930)     LOS:  4 days    Time spent: 25 minutes    Sidney Ace, MD [facility] Pager [phone]  If 7PM-7AM, please contact night-coverage 05/25/2021, 10:25 AM

## 2021-05-25 NOTE — Progress Notes (Signed)
[facility]  ROUNDING NOTE   Subjective:   Ann Flynn was admitted to [facility] on 06/12/2021 for Weakness [R53.1] AKI (acute kidney injury) [facility]) [N17.9] Acute pain of left knee [M25.562] Acute renal insufficiency [N28.9]  Patient was last seen in the office by my partner, Dr. Candiss Norse, on 9/15. Her creatinine was 1.83 with GFR of 28. Patient she was in her usual state of health at that time. She was compliant and taking all her medications.   Patient was originally seen in the ED on 10/1 where she was given topical treatments for her neck strain and knee pain. She states these only helped a little. Patient returned on 10/3 with hypotension, acute kidney injury and leukocytosis. She was then started on IV fluids. No contrast exposure.   Patient laying in bed Alert Poor appetite Denies shortness of breath   Objective:  Vital signs in last 24 hours:  Temp:  [97.4 F (36.3 C)-98.1 F (36.7 C)] 97.5 F (36.4 C) (10/08 0816) Pulse Rate:  [80-93] 88 (10/08 0816) Resp:  [16-20] 20 (10/08 0816) BP: (110-126)/(50-84) 110/67 (10/08 0816) SpO2:  [90 %-95 %] 90 % (10/08 0816)  Weight change:  Filed Weights   06/17/2021 2044 05/21/21 0603  Weight: 106.6 kg 111.1 kg    Intake/Output: I/O last 3 completed shifts: In: 670.2 [P.O.:600; IV Piggyback:70.2] Out: 2400 [Urine:2400]   Intake/Output this shift:  No intake/output data recorded.  Physical Exam: General: NAD, laying in bed  Head: Dry oral mucosa  Eyes: Anicteric  Lungs:  Basilar crackles, [place] O2 4L  Heart: Irregular rhythm  Abdomen:  Soft, nontender  Extremities:  1+ peripheral edema.  Neurologic: Nonfocal, able to answer questions  Skin: No lesions        Basic Metabolic Panel: Recent Labs  Lab 05/21/21 0340 05/22/21 0523 05/23/21 0903 05/24/21 0524 05/25/21 0830  NA 134* 130* 129* 129* 131*  K 4.5 5.0 4.9 4.5 3.9  CL 102 98 94* 94* 96*  CO2 _0 GLUCOSE 252* 221* 317* 295* 150*   BUN 73* 87* 111* 131* 136*  CREATININE 2.75* 2.98* 2.96* 2.82* 2.62*  CALCIUM 7.9* 8.2* 8.6* 8.6* 8.4*  MG  --   --   --   --  2.1     Liver Function Tests: Recent Labs  Lab 05/23/2021 2222 05/22/21 0523  AST 20 18  ALT 13 10  ALKPHOS 75 69  BILITOT 1.3* 1.3*  PROT 6.0* 5.6*  ALBUMIN 3.0* 2.2*    No results for input(s): LIPASE, AMYLASE in the last 168 hours. No results for input(s): AMMONIA in the last 168 hours.  CBC: Recent Labs  Lab 05/18/21 1402 06/12/2021 2222 05/22/21 0523 05/23/21 0903 05/25/21 0830  WBC 10.5 18.0* 15.3* 14.7* 12.4*  NEUTROABS  --  16.4*  --  13.4* 10.1*  HGB 8.9* 9.8* 8.5* 9.3* 9.8*  HCT 29.3* 31.2* 27.3* 28.4* 30.0*  MCV 100.0 98.1 96.1 94.0 92.3  PLT 214 225 224 231 283     Cardiac Enzymes: Recent Labs  Lab 06/07/2021 2222  CKTOTAL 66     BNP: Invalid input(s): POCBNP  CBG: Recent Labs  Lab 05/24/21 0758 05/24/21 1145 05/24/21 1650 05/24/21 2112 05/25/21 0809  GLUCAP 283* 318* 170* 164* 149*     Microbiology: Results for orders placed or performed during the hospital encounter of 06/01/2021  Resp Panel by RT-PCR (Flu A&B, Covid) Nasopharyngeal Swab     Status: None   Collection Time:  05/25/2021 10:22 PM   Specimen: Nasopharyngeal Swab; Nasopharyngeal(NP) swabs in vial transport medium  Result Value Ref Range Status   SARS Coronavirus 2 by RT PCR NEGATIVE NEGATIVE Final    Comment: (NOTE) SARS-CoV-2 target nucleic acids are NOT DETECTED.  The SARS-CoV-2 RNA is generally detectable in upper respiratory specimens during the acute phase of infection. The lowest concentration of SARS-CoV-2 viral copies this assay can detect is 138 copies/mL. A negative result does not preclude SARS-Cov-2 infection and should not be used as the sole basis for treatment or other patient management decisions. A negative result may occur with  improper specimen collection/handling, submission of specimen other than nasopharyngeal swab,  presence of viral mutation(s) within the areas targeted by this assay, and inadequate number of viral copies(<138 copies/mL). A negative result must be combined with clinical observations, patient history, and epidemiological information. The expected result is Negative.  Fact Sheet for Patients:  EntrepreneurPulse.com.au  Fact Sheet for Healthcare Providers:  IncredibleEmployment.be  This test is no t yet approved or cleared by the [place] FDA and  has been authorized for detection and/or diagnosis of SARS-CoV-2 by FDA under an Emergency Use Authorization (EUA). This EUA will remain  in effect (meaning this test can be used) for the duration of the COVID-19 declaration under Section 564(b)(1) of the Act, 21 U.S.C.section 360bbb-3(b)(1), unless the authorization is terminated  or revoked sooner.       Influenza A by PCR NEGATIVE NEGATIVE Final   Influenza B by PCR NEGATIVE NEGATIVE Final    Comment: (NOTE) The Xpert Xpress SARS-CoV-2/FLU/RSV plus assay is intended as an aid in the diagnosis of influenza from Nasopharyngeal swab specimens and should not be used as a sole basis for treatment. Nasal washings and aspirates are unacceptable for Xpert Xpress SARS-CoV-2/FLU/RSV testing.  Fact Sheet for Patients: EntrepreneurPulse.com.au  Fact Sheet for Healthcare Providers: IncredibleEmployment.be  This test is not yet approved or cleared by the [place] FDA and has been authorized for detection and/or diagnosis of SARS-CoV-2 by FDA under an Emergency Use Authorization (EUA). This EUA will remain in effect (meaning this test can be used) for the duration of the COVID-19 declaration under Section 564(b)(1) of the Act, 21 U.S.C. section 360bbb-3(b)(1), unless the authorization is terminated or revoked.  Performed at [facility], [address] [place], [place] [address]   Urine Culture      Status: Abnormal   Collection Time: 05/22/21  1:20 PM   Specimen: Urine, Clean Catch  Result Value Ref Range Status   Specimen Description   Final    URINE, CLEAN CATCH Performed at [facility], [address], [place], [place] [address]    Special Requests   Final    NONE Performed at [facility], [address] [place], [place] [address]    Culture MULTIPLE SPECIES PRESENT, SUGGEST RECOLLECTION (A)  Final   Report Status 05/24/2021 FINAL  Final    Coagulation Studies: No results for input(s): LABPROT, INR in the last 72 hours.  Urinalysis: Recent Labs    05/22/21 1215  COLORURINE YELLOW*  LABSPEC 1.014  PHURINE 5.0  GLUCOSEU NEGATIVE  HGBUR LARGE*  BILIRUBINUR NEGATIVE  KETONESUR NEGATIVE  PROTEINUR 100*  NITRITE NEGATIVE  LEUKOCYTESUR MODERATE*       Imaging: No results found.   Medications:    levofloxacin (LEVAQUIN) IV Stopped (05/24/21 0930)    ambrisentan  5 mg Oral Daily   apixaban  2.5 mg Oral BID   Chlorhexidine  Gluconate Cloth  6 each Topical Daily   influenza vaccine adjuvanted  0.5 mL Intramuscular Tomorrow-1000   insulin aspart  0-15 Units Subcutaneous TID WC   insulin aspart  0-5 Units Subcutaneous QHS   insulin aspart  4 Units Subcutaneous TID WC   insulin glargine-yfgn  15 Units Subcutaneous Daily   mouth rinse  15 mL Mouth Rinse BID   methocarbamol  750 mg Oral TID   metoprolol succinate  12.5 mg Oral q AM   pantoprazole  40 mg Oral Daily   venlafaxine  75 mg Oral q morning   acetaminophen **OR** acetaminophen, albuterol, ALPRAZolam, HYDROmorphone (DILAUDID) injection, ondansetron **OR** ondansetron (ZOFRAN) IV, oxyCODONE  Assessment/ Plan:  Ann Flynn is a 80 y.o. white female with diabetes mellitus type II insulin dependent, atrial fibrillation, diastolic congestive heart failure, pulmonary hypertension, sleep apnea, history of PE, who is admitted to [facility] on 05/22/2021 for Weakness [R53.1] AKI (acute  kidney injury) ([place]) [N17.9] Acute pain of left knee [M25.562] Acute renal insufficiency [N28.9]  Acute kidney injury with hyponatremia on chronic kidney disease stage IV with proteinuria: baseline creatinine 1.83, GFR of 28 on 05/01/2021. Chronic kidney disease secondary to diabetic nephropathy. Acute kidney injury secondary to obstructive uropathy and acute cardiorenal syndrome. No IV contrast exposure.  -Foley catheter placed, UOP 1.7L in 24 hours - Renal ultrasound shows bilateral kidney cortical atrophy, no obstruction, small left renal cyst - holding losartan.  - Creatinine slowly improving - Sodium improved to 131  Hypertension: stable 110/67. Prescribed Ambrisentan and metoprolol. Holding home regimen of losartan.   Acute exacerbation of chronic diastolic congestive heart failure:  - Echo shows EF 50-55% -Continue to hold Furosemide for elevated BUN  Anemia with chronic kidney disease: normocytic, hemoglobin 8.5. Labs consistent with iron deficiency on 7/25.  - Iron studies remain consistent with iron deficiency  Diabetes mellitus type II with chronic kidney disease: insulin dependent: hemoglobin A1c of 7.9%.  - continue glucose control.    LOS: [place] 10/8/202210:34 AM

## 2021-05-26 ENCOUNTER — Inpatient Hospital Stay: Payer: Medicare Other

## 2021-05-26 DIAGNOSIS — N179 Acute kidney failure, unspecified: Secondary | ICD-10-CM | POA: Diagnosis not present

## 2021-05-26 DIAGNOSIS — N189 Chronic kidney disease, unspecified: Secondary | ICD-10-CM | POA: Diagnosis not present

## 2021-05-26 LAB — BASIC METABOLIC PANEL
Anion gap: 12 (ref 5–15)
BUN: 138 mg/dL — ABNORMAL HIGH (ref 8–23)
CO2: 25 mmol/L (ref 22–32)
Calcium: 7.9 mg/dL — ABNORMAL LOW (ref 8.9–10.3)
Chloride: 99 mmol/L (ref 98–111)
Creatinine, Ser: 2.83 mg/dL — ABNORMAL HIGH (ref 0.44–1.00)
GFR, Estimated: 16 mL/min — ABNORMAL LOW (ref >60)
Glucose, Bld: 212 mg/dL — ABNORMAL HIGH (ref 70–99)
Potassium: 4.5 mmol/L (ref 3.5–5.1)
Sodium: 136 mmol/L (ref 135–145)

## 2021-05-26 LAB — BLOOD GAS, ARTERIAL
Acid-base deficit: 6.9 mmol/L — ABNORMAL HIGH (ref 0.0–2.0)
Bicarbonate: 19.2 mmol/L — ABNORMAL LOW (ref 20.0–28.0)
Delivery systems: POSITIVE
Expiratory PAP: 4
FIO2: 75
Inspiratory PAP: 8
O2 Saturation: 99.8 %
Patient temperature: 37
RATE: 16 resp/min
pCO2 arterial: 40 mmHg (ref 32.0–48.0)
pH, Arterial: 7.29 — ABNORMAL LOW (ref 7.350–7.450)
pO2, Arterial: 256 mmHg — ABNORMAL HIGH (ref 83.0–108.0)

## 2021-05-26 LAB — CBC WITH DIFFERENTIAL/PLATELET
Abs Immature Granulocytes: 0.39 10*3/uL — ABNORMAL HIGH (ref 0.00–0.07)
Basophils Absolute: 0.1 10*3/uL (ref 0.0–0.1)
Basophils Relative: 0 %
Eosinophils Absolute: 0 10*3/uL (ref 0.0–0.5)
Eosinophils Relative: 0 %
HCT: 30.1 % — ABNORMAL LOW (ref 36.0–46.0)
Hemoglobin: 9.8 g/dL — ABNORMAL LOW (ref 12.0–15.0)
Immature Granulocytes: 2 %
Lymphocytes Relative: 6 %
Lymphs Abs: 1.1 10*3/uL (ref 0.7–4.0)
MCH: 30.6 pg (ref 26.0–34.0)
MCHC: 32.6 g/dL (ref 30.0–36.0)
MCV: 94.1 fL (ref 80.0–100.0)
Monocytes Absolute: 1.1 10*3/uL — ABNORMAL HIGH (ref 0.1–1.0)
Monocytes Relative: 6 %
Neutro Abs: 16.1 10*3/uL — ABNORMAL HIGH (ref 1.7–7.7)
Neutrophils Relative %: 86 %
Platelets: 270 10*3/uL (ref 150–400)
RBC: 3.2 MIL/uL — ABNORMAL LOW (ref 3.87–5.11)
RDW: 15.8 % — ABNORMAL HIGH (ref 11.5–15.5)
WBC: 18.8 10*3/uL — ABNORMAL HIGH (ref 4.0–10.5)
nRBC: 0.1 % (ref 0.0–0.2)

## 2021-05-26 LAB — GLUCOSE, CAPILLARY
Glucose-Capillary: 203 mg/dL — ABNORMAL HIGH (ref 70–99)
Glucose-Capillary: 199 mg/dL — ABNORMAL HIGH (ref 70–99)

## 2021-05-26 LAB — MAGNESIUM: Magnesium: 2.4 mg/dL (ref 1.7–2.4)

## 2021-05-26 LAB — MRSA NEXT GEN BY PCR, NASAL: MRSA by PCR Next Gen: NOT DETECTED

## 2021-05-26 MED ORDER — METOPROLOL TARTRATE 5 MG/5ML IV SOLN: 5.0000 mg | Freq: Once | INTRAVENOUS | Status: AC

## 2021-05-26 MED ORDER — LORAZEPAM 1 MG PO TABS: 1.0000 mg | ORAL_TABLET | ORAL | Status: DC | PRN

## 2021-05-26 MED ORDER — GLYCOPYRROLATE 0.2 MG/ML IJ SOLN
0.2000 mg | INTRAMUSCULAR | Status: DC | PRN
  Filled 2021-05-26: qty 1

## 2021-05-26 MED ORDER — MAGIC MOUTHWASH
15.0000 mL | Freq: Four times a day (QID) | ORAL | Status: DC | PRN
  Filled 2021-05-26: qty 20

## 2021-05-26 MED ORDER — HALOPERIDOL 0.5 MG PO TABS
0.5000 mg | ORAL_TABLET | ORAL | Status: DC | PRN
  Filled 2021-05-26: qty 1

## 2021-05-26 MED ORDER — BIOTENE DRY MOUTH MT LIQD: 15.0000 mL | OROMUCOSAL | Status: DC | PRN

## 2021-05-26 MED ORDER — DIPHENHYDRAMINE HCL 50 MG/ML IJ SOLN: 12.5000 mg | INTRAMUSCULAR | Status: DC | PRN

## 2021-05-26 MED ORDER — HYDROMORPHONE HCL 1 MG/ML IJ SOLN
0.5000 mg | INTRAMUSCULAR | Status: DC | PRN
  Administered 2021-05-26 – 2021-05-28 (×10): 0.5 mg via INTRAVENOUS
  Filled 2021-05-26 (×8): qty 0.5
  Filled 2021-05-26: qty 1
  Filled 2021-05-26 (×2): qty 0.5

## 2021-05-26 MED ORDER — MORPHINE SULFATE (CONCENTRATE) 10 MG/0.5ML PO SOLN
5.0000 mg | ORAL | Status: DC | PRN
Start: 2021-05-26 — End: 2021-05-28
  Administered 2021-05-26: 5 mg via ORAL
  Filled 2021-05-26: qty 0.5

## 2021-05-26 MED ORDER — LORAZEPAM 2 MG/ML IJ SOLN
1.0000 mg | INTRAMUSCULAR | Status: DC | PRN
  Administered 2021-05-27: 1 mg via INTRAVENOUS
  Filled 2021-05-26: qty 1

## 2021-05-26 MED ORDER — GLYCOPYRROLATE 0.2 MG/ML IJ SOLN
0.2000 mg | INTRAMUSCULAR | Status: DC | PRN
  Administered 2021-05-27 – 2021-05-28 (×3): 0.2 mg via INTRAVENOUS
  Filled 2021-05-26 (×5): qty 1

## 2021-05-26 MED ORDER — MORPHINE SULFATE (CONCENTRATE) 10 MG/0.5ML PO SOLN
5.0000 mg | ORAL | Status: DC | PRN
Start: 2021-05-26 — End: 2021-05-28
  Administered 2021-05-27 (×2): 5 mg via SUBLINGUAL
  Filled 2021-05-26 (×2): qty 0.5

## 2021-05-26 MED ORDER — HYDRALAZINE HCL 20 MG/ML IJ SOLN: 10.0000 mg | INTRAMUSCULAR | Status: DC | PRN

## 2021-05-26 MED ORDER — GLYCOPYRROLATE 1 MG PO TABS
1.0000 mg | ORAL_TABLET | ORAL | Status: DC | PRN
  Administered 2021-05-27: 1 mg via ORAL
  Filled 2021-05-26 (×3): qty 1

## 2021-05-26 MED ORDER — HALOPERIDOL LACTATE 5 MG/ML IJ SOLN: 0.5000 mg | INTRAMUSCULAR | Status: DC | PRN

## 2021-05-26 MED ORDER — HALOPERIDOL LACTATE 5 MG/ML IJ SOLN
1.0000 mg | Freq: Four times a day (QID) | INTRAMUSCULAR | Status: DC | PRN
  Administered 2021-05-26: 1 mg via INTRAMUSCULAR
  Filled 2021-05-26: qty 1

## 2021-05-26 MED ORDER — METOPROLOL TARTRATE 5 MG/5ML IV SOLN
INTRAVENOUS | Status: AC
  Administered 2021-05-26: 5 mg via INTRAVENOUS
  Filled 2021-05-26: qty 5

## 2021-05-26 MED ORDER — DILTIAZEM HCL-DEXTROSE 125-5 MG/125ML-% IV SOLN (PREMIX)
5.0000 mg/h | INTRAVENOUS | Status: DC
  Filled 2021-05-26: qty 125

## 2021-05-26 MED ORDER — HALOPERIDOL LACTATE 2 MG/ML PO CONC
0.5000 mg | ORAL | Status: DC | PRN
  Filled 2021-05-26: qty 0.3

## 2021-05-26 MED ORDER — POLYVINYL ALCOHOL 1.4 % OP SOLN
1.0000 [drp] | Freq: Four times a day (QID) | OPHTHALMIC | Status: DC | PRN
  Filled 2021-05-26: qty 15

## 2021-05-26 MED ORDER — LORAZEPAM 2 MG/ML PO CONC: 1.0000 mg | ORAL | Status: DC | PRN

## 2021-05-26 NOTE — Progress Notes (Signed)
PROGRESS NOTE    Ann Flynn  UDJ:[id number] DOB: Dec 20, 1940 DOA: 06/05/2021 PCP: Birdie Sons, MD   Brief Narrative:   80 y.o. female with medical history significant for diastolic CHF, moderate pulmonary hypertension, IDDM 2, CKD stage IV, OSA on CPAP, , a flutter s/p cardioversion on 4/22 on Eliquis and amiodarone, who presents to the ED for the second time in 3 days with complaints of neck pain and knee pain with difficulty bearing weight and difficulty moving around the house where she lives alone with a daughter that lives nearby.  She denies other complaints.  On her first visit to the ED on 10/1 she had an essentially negative work-up and was discharged with Voltaren gel and a Lidoderm patch for suspected neck strain.  She was brought back to the emergency room on 10/3 because family noted her to be increasingly weak, at this time not explained by her neck pain or knee pain.  She had no cough chest pain or shortness of breath, no nausea or vomiting, but family is concerned for her oral intake given decreasing ability to care for herself at home  10/5: Called to bedside by RN this morning.  Patient with increased work of breathing and increasingly somnolent.  Evaluated bedside.  Rhonchorous breath sounds.  Vital signs stable.  3 L..  Blood gas reassuring.  10/6: Foley placed yesterday.  1400 cc urine liberated.  Patient net -1500cc.  Respiratory status remains tenuous.  Mentation improved  10/8: Patient remains very fatigued.  Continues to endorse severe neck pain.  Urine output adequate.  Oxygenation status unchanged.  Vital signs remained stable.   Assessment & Plan:   Principal Problem:   Acute kidney injury superimposed on CKD IV (HCC) Active Problems:   OSA on CPAP   Chronic pulmonary embolism, unspecified pulmonary embolism type, unspecified whether acute cor pulmonale present (HCC)   Morbid (severe) obesity due to excess calories (HCC)   Atrial fibrillation, chronic  (HCC)   Diabetes mellitus, type II ([place])   Pulmonary hypertension (HCC)   Neck pain   Effusion of left knee   Hyponatremia   Anemia in chronic kidney disease (CKD)   Acute renal insufficiency  AKI on CKD stage IV Likely multifactorial Initially concern for ATN versus prerenal azotemia Now likely element of cardiorenal syndrome Renal US, cortical atrophy without hydronephrosis Nephrology following, recommendations appreciated Loop diuretics held on 10/7 due to worsening azotemia Plan: Diuretics currently on hold Defer to nephrology regarding restart Daily renal function Appreciate nephrology follow-up Patient may still require temporary HD  Acute on chronic diastolic congestive heart failure Initially not overloaded Received IV fluid hydration Creatinine continue to worsen Now suspect cardiorenal syndrome Plan: Diuretics on hold as above Daily weights Continue beta-blocker Hold ACE inhibitor  Neck pain Left knee effusion with ambulatory dysfunction Diagnostic x-rays negative for fracture Moderate effusion left knee Patient's main complaint is pain in the neck Complaints of pain have been worsening  Plan: Continue 3 times daily Robaxin Added as needed oxycodone Low-dose IV Dilaudid for breakthrough not receptive to oral medications Lidoderm Avoid NSAIDs Therapy evaluations Check MR cervical spine  Type 2 diabetes mellitus with hyperglycemia Semglee 15 units daily NovoLog 4 units 3 times daily with meals Moderate sliding scale  Chronic hypoxic respiratory failure Patient baseline 3 L oxygen Currently on this rate Increased work of breathing noted 10/5 Likely multifactorial etiology Plan: No further steroids Lasix on hold Empiric Levaquin for CAP coverage, 5-day course, completed Vitals per unit  protocol  Depression PTA Effexor  Paroxysmal atrial fibrillation Rate controlled Continue Eliquis Continue metoprolol  OSA on nightly CPAP      DVT  prophylaxis: Eliquis Code Status: Full Family Communication: 2 family members at bedside this morning 10/7.   daughter Derinda Sis [phone] on 10/9 Disposition Plan: Status is: Inpatient  Remains inpatient appropriate because:Inpatient level of care appropriate due to severity of illness  Dispo: The patient is from: Home              Anticipated d/c is to: SNF              Patient currently is not medically stable to d/c.   Difficult to place patient No       Level of care: Med-Surg  Consultants:  None  Procedures:  None  Antimicrobials:  Levofloxacin   Subjective: Seen and examined.  Encephalopathy persistent.  Yelling out in pain intermittently.  Remains on 4 L  Objective: Vitals:   05/25/21 2003 05/26/21 0118 05/26/21 0339 05/26/21 0814  BP:  133/86 (!) 139/59 (!) 127/107  Pulse:  74 96 (!) 103  Resp: _0 Temp:  97.6 F (36.4 C) (!) 97.5 F (36.4 C) (!) 97.5 F (36.4 C)  TempSrc:  Axillary Oral Oral  SpO2: 92% 92% 95% 93%  Weight:      Height:        Intake/Output Summary (Last 24 hours) at 05/26/2021 0931 Last data filed at 05/26/2021 0556 Gross per 24 hour  Intake 0 ml  Output 1100 ml  Net -1100 ml   Filed Weights   06/09/2021 2044 05/21/21 0603  Weight: 106.6 kg 111.1 kg    Examination:  General exam: Fatigued and dyspneic.  Ill-appearing Respiratory system: Bibasilar crackles.  Increased work of breathing.  4 L Cardiovascular system: S1-S2, regular rate, irregular rhythm, no murmurs Gastrointestinal system: Obese, NT/ND, normal bowel sounds Central nervous system: Fatigue.  Unable to assess orientation.  No focal deficits Extremities: Diffusely decreased power bilaterally Skin: No rashes, lesions or ulcers Psychiatry: Judgement and insight appear impaired. Mood & affect flattened.     Data Reviewed: I have personally reviewed following labs and imaging studies  CBC: Recent Labs  Lab 05/23/2021 2222 05/22/21 0523  05/23/21 0903 05/25/21 0830 05/26/21 0722  WBC 18.0* 15.3* 14.7* 12.4* 18.8*  NEUTROABS 16.4*  --  13.4* 10.1* 16.1*  HGB 9.8* 8.5* 9.3* 9.8* 9.8*  HCT 31.2* 27.3* 28.4* 30.0* 30.1*  MCV 98.1 96.1 94.0 92.3 94.1  PLT 225 224 231 283 158   Basic Metabolic Panel: Recent Labs  Lab 05/22/21 0523 05/23/21 0903 05/24/21 0524 05/25/21 0830 05/26/21 0722  NA 130* 129* 129* 131* 136  K 5.0 4.9 4.5 3.9 4.5  CL 98 94* 94* 96* 99  CO2 _1 GLUCOSE 221* 317* 295* 150* 212*  BUN 87* 111* 131* 136* 138*  CREATININE 2.98* 2.96* 2.82* 2.62* 2.83*  CALCIUM 8.2* 8.6* 8.6* 8.4* 7.9*  MG  --   --   --  2.1 2.4   GFR: Estimated Creatinine Clearance: 19.7 mL/min (A) (by C-G formula based on SCr of 2.83 mg/dL (H)). Liver Function Tests: Recent Labs  Lab 06/17/2021 2222 05/22/21 0523  AST 20 18  ALT 13 10  ALKPHOS 75 69  BILITOT 1.3* 1.3*  PROT 6.0* 5.6*  ALBUMIN 3.0* 2.2*   No results for input(s): LIPASE, AMYLASE in the last 168 hours. No results for input(s): AMMONIA in the  last 168 hours. Coagulation Profile: No results for input(s): INR, PROTIME in the last 168 hours. Cardiac Enzymes: Recent Labs  Lab 06/14/2021 2222  CKTOTAL 66   BNP (last 3 results) No results for input(s): PROBNP in the last 8760 hours. HbA1C: No results for input(s): HGBA1C in the last 72 hours.  CBG: Recent Labs  Lab 05/25/21 0809 05/25/21 1153 05/25/21 1746 05/25/21 2104 05/26/21 0811  GLUCAP 149* 107* 94 147* 203*   Lipid Profile: No results for input(s): CHOL, HDL, LDLCALC, TRIG, CHOLHDL, LDLDIRECT in the last 72 hours. Thyroid Function Tests: No results for input(s): TSH, T4TOTAL, FREET4, T3FREE, THYROIDAB in the last 72 hours. Anemia Panel: No results for input(s): VITAMINB12, FOLATE, FERRITIN, TIBC, IRON, RETICCTPCT in the last 72 hours.  Sepsis Labs: No results for input(s): PROCALCITON, LATICACIDVEN in the last 168 hours.  Recent Results (from the past 240 hour(s))  Resp  Panel by RT-PCR (Flu A&B, Covid) Nasopharyngeal Swab     Status: None   Collection Time: 06/10/2021 10:22 PM   Specimen: Nasopharyngeal Swab; Nasopharyngeal(NP) swabs in vial transport medium  Result Value Ref Range Status   SARS Coronavirus 2 by RT PCR NEGATIVE NEGATIVE Final    Comment: (NOTE) SARS-CoV-2 target nucleic acids are NOT DETECTED.  The SARS-CoV-2 RNA is generally detectable in upper respiratory specimens during the acute phase of infection. The lowest concentration of SARS-CoV-2 viral copies this assay can detect is 138 copies/mL. A negative result does not preclude SARS-Cov-2 infection and should not be used as the sole basis for treatment or other patient management decisions. A negative result may occur with  improper specimen collection/handling, submission of specimen other than nasopharyngeal swab, presence of viral mutation(s) within the areas targeted by this assay, and inadequate number of viral copies(<138 copies/mL). A negative result must be combined with clinical observations, patient history, and epidemiological information. The expected result is Negative.  Fact Sheet for Patients:  EntrepreneurPulse.com.au  Fact Sheet for Healthcare Providers:  IncredibleEmployment.be  This test is no t yet approved or cleared by the [place] FDA and  has been authorized for detection and/or diagnosis of SARS-CoV-2 by FDA under an Emergency Use Authorization (EUA). This EUA will remain  in effect (meaning this test can be used) for the duration of the COVID-19 declaration under Section 564(b)(1) of the Act, 21 U.S.C.section 360bbb-3(b)(1), unless the authorization is terminated  or revoked sooner.       Influenza A by PCR NEGATIVE NEGATIVE Final   Influenza B by PCR NEGATIVE NEGATIVE Final    Comment: (NOTE) The Xpert Xpress SARS-CoV-2/FLU/RSV plus assay is intended as an aid in the diagnosis of influenza from Nasopharyngeal  swab specimens and should not be used as a sole basis for treatment. Nasal washings and aspirates are unacceptable for Xpert Xpress SARS-CoV-2/FLU/RSV testing.  Fact Sheet for Patients: EntrepreneurPulse.com.au  Fact Sheet for Healthcare Providers: IncredibleEmployment.be  This test is not yet approved or cleared by the [place] FDA and has been authorized for detection and/or diagnosis of SARS-CoV-2 by FDA under an Emergency Use Authorization (EUA). This EUA will remain in effect (meaning this test can be used) for the duration of the COVID-19 declaration under Section 564(b)(1) of the Act, 21 U.S.C. section 360bbb-3(b)(1), unless the authorization is terminated or revoked.  Performed at [facility], [address], [place], [place] [address]   Urine Culture     Status: Abnormal   Collection Time: 05/22/21  1:20 PM   Specimen: Urine, Clean Catch  Result Value Ref Range Status   Specimen Description   Final    URINE, CLEAN CATCH Performed at [facility], [address] [place], [place] [address]    Special Requests   Final    NONE Performed at [facility], [address] [place], [place] [address]    Culture MULTIPLE SPECIES PRESENT, SUGGEST RECOLLECTION (A)  Final   Report Status 05/24/2021 FINAL  Final         Radiology Studies: No results found.      Scheduled Meds:  ambrisentan  5 mg Oral Daily   apixaban  2.5 mg Oral BID   Chlorhexidine Gluconate Cloth  6 each Topical Daily   influenza vaccine adjuvanted  0.5 mL Intramuscular Tomorrow-1000   insulin aspart  0-15 Units Subcutaneous TID WC   insulin aspart  0-5 Units Subcutaneous QHS   insulin aspart  4 Units Subcutaneous TID WC   insulin glargine-yfgn  15 Units Subcutaneous Daily   mouth rinse  15 mL Mouth Rinse BID   methocarbamol  750 mg Oral TID   metoprolol succinate  12.5 mg Oral q AM   pantoprazole  40 mg Oral Daily    venlafaxine  75 mg Oral q morning   Continuous Infusions:  levofloxacin (LEVAQUIN) IV 500 mg (05/26/21 0930)     LOS: 5 days    Time spent: 25 minutes    Sidney Ace, MD [facility] Pager [phone]  If 7PM-7AM, please contact night-coverage 05/26/2021, 9:31 AM

## 2021-05-26 NOTE — Progress Notes (Addendum)
PT Cancellation Note  Patient Details Name: Ann Flynn MRN: [id number] DOB: Sep 29, 1940   Cancelled Treatment:    Reason Eval/Treat Not Completed: Patient not medically ready. Rapid response being called due to medical issues. PT will hold and follow up when appropriate.   Addendum: Patient was transferred to ICU for further evaluation. Please re-consult PT when medically appropriate. PT will sign off at this time given change in medical status.   Minna Merritts, PT, MPT  Percell Locus 05/26/2021, 10:33 AM

## 2021-05-26 NOTE — Progress Notes (Signed)
Pt is screaming in pain on her right shoulder, neck and head. IV Dilaudid given. Pt now sleeping.

## 2021-05-26 NOTE — Progress Notes (Addendum)
Pt is agitated and yelling out. Pt said she's scared and asking for help while grabbing her hair. Lethargic & oriented to self only. V/S stable. Increased oxygen at 4lpm/[place] with sats at  95%. Haldol IM ordered by Oncall provider and given.

## 2021-05-26 NOTE — Progress Notes (Signed)
[facility]  ROUNDING NOTE   Subjective:   [name] was admitted to [facility] on 05/23/2021 for Weakness [R53.1] AKI (acute kidney injury) [facility]) [N17.9] Acute pain of left knee [M25.562] Acute renal insufficiency [N28.9]  Patient was last seen in the office by my partner, Dr. Candiss Norse, on 9/15. Her creatinine was 1.83 with GFR of 28. Patient she was in her usual state of health at that time. She was compliant and taking all her medications.   Patient was originally seen in the ED on 10/1 where she was given topical treatments for her neck strain and knee pain. She states these only helped a little. Patient returned on 10/3 with hypotension, acute kidney injury and leukocytosis. She was then started on IV fluids. No contrast exposure.   When walked into the room this morning. Patient not responding, tachypnic with shallow breathing. Found to have tachycardia and hypoxia. Rapid Response called and patient placed on BIPAP, moved to ICU.    Objective:  Vital signs in last 24 hours:  Temp:  [97.4 F (36.3 C)-98 F (36.7 C)] 97.9 F (36.6 C) (10/09 1000) Pulse Rate:  [70-103] 103 (10/09 0814) Resp:  [14-20] 14 (10/09 1000) BP: (95-142)/(44-107) 107/89 (10/09 1000) SpO2:  [86 %-99 %] 99 % (10/09 1000) Weight:  [110 kg] 110 kg (10/09 1000)  Weight change:  Filed Weights   05/26/2021 2044 05/21/21 0603 05/26/21 1000  Weight: 106.6 kg 111.1 kg 110 kg    Intake/Output: I/O last 3 completed shifts: In: 290 [P.O.:240; IV Piggyback:50] Out: 2000 [Urine:2000]   Intake/Output this shift:  No intake/output data recorded.  Physical Exam: General: Ill appearing.   Head: Dry oral mucosa  Eyes: Anicteric  Lungs:  Basilar crackles, [place] O2 4L  Heart: Irregular rhythm, tachycardia.   Abdomen:  Soft, nontender, obese  Extremities:  1+ peripheral edema.  Neurologic: Not answering questions  Skin: No lesions   Access:  none    Basic Metabolic Panel: Recent Labs  Lab  05/22/21 0523 05/23/21 0903 05/24/21 0524 05/25/21 0830 05/26/21 0722  NA 130* 129* 129* 131* 136  K 5.0 4.9 4.5 3.9 4.5  CL 98 94* 94* 96* 99  CO2 _0 GLUCOSE 221* 317* 295* 150* 212*  BUN 87* 111* 131* 136* 138*  CREATININE 2.98* 2.96* 2.82* 2.62* 2.83*  CALCIUM 8.2* 8.6* 8.6* 8.4* 7.9*  MG  --   --   --  2.1 2.4     Liver Function Tests: Recent Labs  Lab 06/04/2021 2222 05/22/21 0523  AST 20 18  ALT 13 10  ALKPHOS 75 69  BILITOT 1.3* 1.3*  PROT 6.0* 5.6*  ALBUMIN 3.0* 2.2*    No results for input(s): LIPASE, AMYLASE in the last 168 hours. No results for input(s): AMMONIA in the last 168 hours.  CBC: Recent Labs  Lab 06/02/2021 2222 05/22/21 0523 05/23/21 0903 05/25/21 0830 05/26/21 0722  WBC 18.0* 15.3* 14.7* 12.4* 18.8*  NEUTROABS 16.4*  --  13.4* 10.1* 16.1*  HGB 9.8* 8.5* 9.3* 9.8* 9.8*  HCT 31.2* 27.3* 28.4* 30.0* 30.1*  MCV 98.1 96.1 94.0 92.3 94.1  PLT 225 224 231 283 270     Cardiac Enzymes: Recent Labs  Lab 06/01/2021 2222  CKTOTAL 66     BNP: Invalid input(s): POCBNP  CBG: Recent Labs  Lab 05/25/21 0809 05/25/21 1153 05/25/21 1746 05/25/21 2104 05/26/21 0811  GLUCAP 149* 107* 94 147* 203*     Microbiology: Results for  orders placed or performed during the hospital encounter of 06/12/2021  Resp Panel by RT-PCR (Flu A&B, Covid) Nasopharyngeal Swab     Status: None   Collection Time: 06/13/2021 10:22 PM   Specimen: Nasopharyngeal Swab; Nasopharyngeal(NP) swabs in vial transport medium  Result Value Ref Range Status   SARS Coronavirus 2 by RT PCR NEGATIVE NEGATIVE Final    Comment: (NOTE) SARS-CoV-2 target nucleic acids are NOT DETECTED.  The SARS-CoV-2 RNA is generally detectable in upper respiratory specimens during the acute phase of infection. The lowest concentration of SARS-CoV-2 viral copies this assay can detect is 138 copies/mL. A negative result does not preclude SARS-Cov-2 infection and should not be used as  the sole basis for treatment or other patient management decisions. A negative result may occur with  improper specimen collection/handling, submission of specimen other than nasopharyngeal swab, presence of viral mutation(s) within the areas targeted by this assay, and inadequate number of viral copies(<138 copies/mL). A negative result must be combined with clinical observations, patient history, and epidemiological information. The expected result is Negative.  Fact Sheet for Patients:  EntrepreneurPulse.com.au  Fact Sheet for Healthcare Providers:  IncredibleEmployment.be  This test is no t yet approved or cleared by the [place] FDA and  has been authorized for detection and/or diagnosis of SARS-CoV-2 by FDA under an Emergency Use Authorization (EUA). This EUA will remain  in effect (meaning this test can be used) for the duration of the COVID-19 declaration under Section 564(b)(1) of the Act, 21 U.S.C.section 360bbb-3(b)(1), unless the authorization is terminated  or revoked sooner.       Influenza A by PCR NEGATIVE NEGATIVE Final   Influenza B by PCR NEGATIVE NEGATIVE Final    Comment: (NOTE) The Xpert Xpress SARS-CoV-2/FLU/RSV plus assay is intended as an aid in the diagnosis of influenza from Nasopharyngeal swab specimens and should not be used as a sole basis for treatment. Nasal washings and aspirates are unacceptable for Xpert Xpress SARS-CoV-2/FLU/RSV testing.  Fact Sheet for Patients: EntrepreneurPulse.com.au  Fact Sheet for Healthcare Providers: IncredibleEmployment.be  This test is not yet approved or cleared by the [place] FDA and has been authorized for detection and/or diagnosis of SARS-CoV-2 by FDA under an Emergency Use Authorization (EUA). This EUA will remain in effect (meaning this test can be used) for the duration of the COVID-19 declaration under Section 564(b)(1) of  the Act, 21 U.S.C. section 360bbb-3(b)(1), unless the authorization is terminated or revoked.  Performed at [facility] [address], [place], [place] [address]   Urine Culture     Status: Abnormal   Collection Time: 05/22/21  1:20 PM   Specimen: Urine, Clean Catch  Result Value Ref Range Status   Specimen Description   Final    URINE, CLEAN CATCH Performed at [facility], [address] [place], [place] [address]    Special Requests   Final    NONE Performed at [facility], [address] [place], [place] [address]    Culture MULTIPLE SPECIES PRESENT, SUGGEST RECOLLECTION (A)  Final   Report Status 05/24/2021 FINAL  Final    Coagulation Studies: No results for input(s): LABPROT, INR in the last 72 hours.  Urinalysis: No results for input(s): COLORURINE, LABSPEC, PHURINE, GLUCOSEU, HGBUR, BILIRUBINUR, KETONESUR, PROTEINUR, UROBILINOGEN, NITRITE, LEUKOCYTESUR in the last 72 hours.  Invalid input(s): APPERANCEUR     Imaging: No results found.   Medications:      ambrisentan  5 mg Oral Daily   apixaban  2.5 mg Oral BID   Chlorhexidine Gluconate Cloth  6 each Topical Daily   influenza vaccine adjuvanted  0.5 mL Intramuscular Tomorrow-1000   insulin aspart  0-15 Units Subcutaneous TID WC   insulin aspart  0-5 Units Subcutaneous QHS   insulin aspart  4 Units Subcutaneous TID WC   insulin glargine-yfgn  15 Units Subcutaneous Daily   mouth rinse  15 mL Mouth Rinse BID   metoprolol succinate  12.5 mg Oral q AM   metoprolol tartrate       metoprolol tartrate  5 mg Intravenous Once   pantoprazole  40 mg Oral Daily   venlafaxine  75 mg Oral q morning   acetaminophen **OR** acetaminophen, albuterol, ALPRAZolam, haloperidol lactate, hydrALAZINE, HYDROmorphone (DILAUDID) injection, ondansetron **OR** ondansetron (ZOFRAN) IV, oxyCODONE  Assessment/ Plan:  [name] is a 80 y.o. white female with diabetes mellitus type II  insulin dependent, atrial fibrillation, diastolic congestive heart failure, pulmonary hypertension, sleep apnea, history of PE, who is admitted to [facility] on 06/07/2021 for Weakness [R53.1] AKI (acute kidney injury) ([place]) [N17.9] Acute pain of left knee [M25.562] Acute renal insufficiency [N28.9]  Acute kidney injury with hyponatremia on chronic kidney disease stage IV with proteinuria: baseline creatinine 1.83, GFR of 28 on 05/01/2021. Chronic kidney disease secondary to diabetic nephropathy. Acute kidney injury secondary to obstructive uropathy and acute cardiorenal syndrome. No IV contrast exposure. Foley catheter placed on 10/5. Good urine output. Renal ultrasound shows bilateral kidney cortical atrophy, no obstruction.  - holding losartan.  - Creatinine and BUN elevated.  - Low threshold for renal replacement. Discussed with family at bedside and with daughter, Ms. Derinda Sis, who will be coming up to see patient today with her Living Will.   Hypertension: stable 110/67. Prescribed Ambrisentan and metoprolol. Holding home regimen of losartan.   Hyponatremia: improved to 136 this morning.   Acute exacerbation of chronic diastolic congestive heart failure: Echo with preserved systolic function and diastolic function.  - holding IV furosemide due to azotemia  Anemia with chronic kidney disease: normocytic, hemoglobin 9.8, normocytic. Labs consistent with iron deficiency.   Diabetes mellitus type II with chronic kidney disease: insulin dependent: hemoglobin A1c of 7.9%.  - continue glucose control.    LOS: 5 Ayo Guarino 10/9/202210:53 AM

## 2021-05-26 NOTE — Care Management (Signed)
Rapid response called today due to decreased level of responsiveness and atrial fibrillation with rapid ventricular response.  Upon my arrival nephrology consult at bedside.  Heart rate 140 and patient decreased level of responsiveness.  Transferred emergently to intensive care unit.  Noninvasive positive pressure ventilation initiated.  Arterial blood gas demonstrated mild respiratory acidosis.  After initiation of BiPAP patient became more agitated.  Deteriorating kidney function noted.  Long conversations with the family and discussion with nephrology consultant.  After lengthy discussion decision was made to proceed with full comfort measures.  This time all medications not focused on patient comfort have been discontinued.  BiPAP has been discontinued.  Will place patient on nasal cannula 2L for patient comfort.  All medications not focused on patient comfort.  Appreciate assistance from [facility] and nephrology teams.  Patient DNR.  Ralene Muskrat MD

## 2021-05-26 NOTE — Progress Notes (Signed)
OT Cancellation Note  Patient Details Name: Ann Flynn MRN: [id number] DOB: July 26, 1941   Cancelled Treatment:    Reason Eval/Treat Not Completed: Patient not medically ready. Per chart review, pt noted to have had rapid response this AM and has transitioned to higher level care. Per therapy protocols, will require new orders to initiate therapy services. OT to sign off at this time. Please re-consult when pt is medically appropriate.   Fredirick Maudlin, OTR/L [place]

## 2021-05-26 NOTE — Progress Notes (Signed)
Chaplain responded to Rapid Response, escorted son and son's wife to ICU waiting room, provided refreshments, facilitated communication with RN, offered prayer to pt bedside.    Daughter (POA) arrived with her husband. Daughter and Son are bedside.  Family requests more communication with medical staff. There is some confusion as to pt's status and recommended next steps.   Chaplain will follow.   Minus [place], Chaplain Chaplain's hospital cell (text/call) [phone]     05/26/21 1206  Clinical Encounter Type  Visited With Patient and family together  Visit Type Initial;Critical Care  Spiritual Encounters  Spiritual Needs Prayer  Stress Factors  Patient Stress Factors Health changes  Family Stress Factors Lack of knowledge

## 2021-05-26 NOTE — Significant Event (Signed)
Rapid Response Event Note   Reason for Call : called RRT due to HR, LOC, and Resp distress   Initial Focused Assessment: Laying in bed, HR on monitor 140's, NRB in place- 02 sats in 80's      Interventions: Dr's Sreenath and Kolluru at bedside... immediate tx to stepdown for futher evaluation. Bipap, ABG, Metprolol ordered.   Plan of Care: see above    Event Summary: see above  MD Notified: Dr Priscella Mann Call Time:1033 Arrival Time:1035 End Time:1100  Jeweliana Dudgeon A, RN

## 2021-05-26 NOTE — Progress Notes (Signed)
Patient complaint of pain upon assessment pain scale 0-10 pain of 10,  dilaudid given, vital signs appropriate, on 4LNC O2 sats of 93%-94%. PO meds was held, pt. Start coughing with sips of milk. MD made aware.  MRI and swallow eval ordered.  Rapid response was called, pt. Desat to 80's, on 4LNC, pt put on nonrebreather. Transferred to ICU.

## 2021-05-27 DIAGNOSIS — N189 Chronic kidney disease, unspecified: Secondary | ICD-10-CM | POA: Diagnosis not present

## 2021-05-27 DIAGNOSIS — N179 Acute kidney failure, unspecified: Secondary | ICD-10-CM | POA: Diagnosis not present

## 2021-05-27 NOTE — Progress Notes (Signed)
PROGRESS NOTE    MINH JASPER  TKZ:[id number] DOB: Aug 02, 1941 DOA: 06/14/2021 PCP: Birdie Sons, MD   Brief Narrative:   80 y.o. female with medical history significant for diastolic CHF, moderate pulmonary hypertension, IDDM 2, CKD stage IV, OSA on CPAP, , a flutter s/p cardioversion on 4/22 on Eliquis and amiodarone, who presents to the ED for the second time in 3 days with complaints of neck pain and knee pain with difficulty bearing weight and difficulty moving around the house where she lives alone with a daughter that lives nearby.  She denies other complaints.  On her first visit to the ED on 10/1 she had an essentially negative work-up and was discharged with Voltaren gel and a Lidoderm patch for suspected neck strain.  She was brought back to the emergency room on 10/3 because family noted her to be increasingly weak, at this time not explained by her neck pain or knee pain.  She had no cough chest pain or shortness of breath, no nausea or vomiting, but family is concerned for her oral intake given decreasing ability to care for herself at home  10/5: Called to bedside by RN this morning.  Patient with increased work of breathing and increasingly somnolent.  Evaluated bedside.  Rhonchorous breath sounds.  Vital signs stable.  3 L..  Blood gas reassuring.  10/6: Foley placed yesterday.  1400 cc urine liberated.  Patient net -1500cc.  Respiratory status remains tenuous.  Mentation improved  10/8: Patient remains very fatigued.  Continues to endorse severe neck pain.  Urine output adequate.  Oxygenation status unchanged.  Vital signs remained stable.  10/9: Patient had a decompensation event yesterday.  Decreased level of responsiveness.  Rapid atrial fibrillation.  Transferred emergently to ICU.  After lengthy discussion with the family in conjunction with nephrology consult decision was made to proceed with full comfort measures.  For dialysis.  This morning patient lethargic.   Verbally unresponsive.  TOC engaged.  We will proceed with hospice referral.   Assessment & Plan:   Principal Problem:   Acute kidney injury superimposed on CKD IV (HCC) Active Problems:   OSA on CPAP   Chronic pulmonary embolism, unspecified pulmonary embolism type, unspecified whether acute cor pulmonale present (HCC)   Morbid (severe) obesity due to excess calories (HCC)   Atrial fibrillation, chronic (HCC)   Diabetes mellitus, type II ([place])   Pulmonary hypertension (HCC)   Neck pain   Effusion of left knee   Hyponatremia   Anemia in chronic kidney disease (CKD)   Acute renal insufficiency  AKI on CKD stage IV Likely multifactorial Initially concern for ATN versus prerenal azotemia Now likely element of cardiorenal syndrome Renal US, cortical atrophy without hydronephrosis Nephrology following, recommendations appreciated Loop diuretics held on 10/7 due to worsening azotemia Plan: Comfort measures.  Diuretics held  Acute on chronic diastolic congestive heart failure Initially not overloaded Received IV fluid hydration Creatinine continue to worsen Now suspect cardiorenal syndrome Plan: See above.  Comfort measures  Neck pain Left knee effusion with ambulatory dysfunction Diagnostic x-rays negative for fracture Moderate effusion left knee Patient's main complaint is pain in the neck Complaints of pain have been worsening  Plan: Comfort measures.  Liberalize pain control.  Type 2 diabetes mellitus with hyperglycemia Semglee 15 units daily NovoLog 4 units 3 times daily with meals Moderate sliding scale  Chronic hypoxic respiratory failure Patient baseline 3 L oxygen Currently on this rate Increased work of breathing noted 10/5 Likely multifactorial  etiology Plan: 2 L for patient comfort only  Depression Comfort care  Paroxysmal atrial fibrillation Comfort care.  Rate control and anticoagulation held  OSA on nightly CPAP      DVT prophylaxis:  Eliquis Code Status: Full Family Communication: 2 family members at bedside this morning 10/7.   daughter Derinda Sis [phone] on 10/9, 4 family members on 10/9 Disposition Plan: Status is: Inpatient  Remains inpatient appropriate because:Ongoing active pain requiring inpatient pain management, Altered mental status, and Inpatient level of care appropriate due to severity of illness  Dispo: The patient is from: Home              Anticipated d/c is to:  Inpatient hospice              Patient currently is not medically stable to d/c.   Difficult to place patient No  Patient likely to require inpatient hospice bed.  TOC and hospice liaison engaged.             Level of care: Med-Surg  Consultants:  None  Procedures:  None  Antimicrobials:    Subjective: Seen and examined.  Verbally unresponsive.  Objective: Vitals:   05/26/21 1300 05/26/21 1400 05/26/21 1500 05/26/21 1510  BP: 106/67 109/61 108/64   Pulse: (!) 134 (!) 116 94   Resp: (!) 23 14 (!) 27   Temp:   97.6 F (36.4 C)   TempSrc:   Axillary   SpO2: 95% 95% 95% 95%  Weight:      Height:        Intake/Output Summary (Last 24 hours) at 05/27/2021 0954 Last data filed at 05/26/2021 1215 Gross per 24 hour  Intake 29.82 ml  Output --  Net 29.82 ml   Filed Weights   06/14/2021 2044 05/21/21 0603 05/26/21 1000  Weight: 106.6 kg 111.1 kg 110 kg    Examination:  Limited exam due to comfort measure status  General exam: Fatigued and dyspneic.  Ill-appearing Respiratory system: Bibasilar crackles.  Increased work of breathing.  2 L Cardiovascular system: S1-S2, regular rate, irregular rhythm, no murmurs    Data Reviewed: I have personally reviewed following labs and imaging studies  CBC: Recent Labs  Lab 05/24/2021 2222 05/22/21 0523 05/23/21 0903 05/25/21 0830 05/26/21 0722  WBC 18.0* 15.3* 14.7* 12.4* 18.8*  NEUTROABS 16.4*  --  13.4* 10.1* 16.1*  HGB 9.8* 8.5* 9.3* 9.8* 9.8*  HCT 31.2*  27.3* 28.4* 30.0* 30.1*  MCV 98.1 96.1 94.0 92.3 94.1  PLT 225 224 231 283 937   Basic Metabolic Panel: Recent Labs  Lab 05/22/21 0523 05/23/21 0903 05/24/21 0524 05/25/21 0830 05/26/21 0722  NA 130* 129* 129* 131* 136  K 5.0 4.9 4.5 3.9 4.5  CL 98 94* 94* 96* 99  CO2 _0 GLUCOSE 221* 317* 295* 150* 212*  BUN 87* 111* 131* 136* 138*  CREATININE 2.98* 2.96* 2.82* 2.62* 2.83*  CALCIUM 8.2* 8.6* 8.6* 8.4* 7.9*  MG  --   --   --  2.1 2.4   GFR: Estimated Creatinine Clearance: 19.6 mL/min (A) (by C-G formula based on SCr of 2.83 mg/dL (H)). Liver Function Tests: Recent Labs  Lab 05/26/2021 2222 05/22/21 0523  AST 20 18  ALT 13 10  ALKPHOS 75 69  BILITOT 1.3* 1.3*  PROT 6.0* 5.6*  ALBUMIN 3.0* 2.2*   No results for input(s): LIPASE, AMYLASE in the last 168 hours. No results for input(s): AMMONIA in the last 168 hours.  Coagulation Profile: No results for input(s): INR, PROTIME in the last 168 hours. Cardiac Enzymes: Recent Labs  Lab 06/17/2021 2222  CKTOTAL 66   BNP (last 3 results) No results for input(s): PROBNP in the last 8760 hours. HbA1C: No results for input(s): HGBA1C in the last 72 hours.  CBG: Recent Labs  Lab 05/25/21 1153 05/25/21 1746 05/25/21 2104 05/26/21 0811 05/26/21 1059  GLUCAP 107* 94 147* 203* 199*   Lipid Profile: No results for input(s): CHOL, HDL, LDLCALC, TRIG, CHOLHDL, LDLDIRECT in the last 72 hours. Thyroid Function Tests: No results for input(s): TSH, T4TOTAL, FREET4, T3FREE, THYROIDAB in the last 72 hours. Anemia Panel: No results for input(s): VITAMINB12, FOLATE, FERRITIN, TIBC, IRON, RETICCTPCT in the last 72 hours.  Sepsis Labs: No results for input(s): PROCALCITON, LATICACIDVEN in the last 168 hours.  Recent Results (from the past 240 hour(s))  Resp Panel by RT-PCR (Flu A&B, Covid) Nasopharyngeal Swab     Status: None   Collection Time: 05/29/2021 10:22 PM   Specimen: Nasopharyngeal Swab; Nasopharyngeal(NP)  swabs in vial transport medium  Result Value Ref Range Status   SARS Coronavirus 2 by RT PCR NEGATIVE NEGATIVE Final    Comment: (NOTE) SARS-CoV-2 target nucleic acids are NOT DETECTED.  The SARS-CoV-2 RNA is generally detectable in upper respiratory specimens during the acute phase of infection. The lowest concentration of SARS-CoV-2 viral copies this assay can detect is 138 copies/mL. A negative result does not preclude SARS-Cov-2 infection and should not be used as the sole basis for treatment or other patient management decisions. A negative result may occur with  improper specimen collection/handling, submission of specimen other than nasopharyngeal swab, presence of viral mutation(s) within the areas targeted by this assay, and inadequate number of viral copies(<138 copies/mL). A negative result must be combined with clinical observations, patient history, and epidemiological information. The expected result is Negative.  Fact Sheet for Patients:  EntrepreneurPulse.com.au  Fact Sheet for Healthcare Providers:  IncredibleEmployment.be  This test is no t yet approved or cleared by the [place] FDA and  has been authorized for detection and/or diagnosis of SARS-CoV-2 by FDA under an Emergency Use Authorization (EUA). This EUA will remain  in effect (meaning this test can be used) for the duration of the COVID-19 declaration under Section 564(b)(1) of the Act, 21 U.S.C.section 360bbb-3(b)(1), unless the authorization is terminated  or revoked sooner.       Influenza A by PCR NEGATIVE NEGATIVE Final   Influenza B by PCR NEGATIVE NEGATIVE Final    Comment: (NOTE) The Xpert Xpress SARS-CoV-2/FLU/RSV plus assay is intended as an aid in the diagnosis of influenza from Nasopharyngeal swab specimens and should not be used as a sole basis for treatment. Nasal washings and aspirates are unacceptable for Xpert Xpress  SARS-CoV-2/FLU/RSV testing.  Fact Sheet for Patients: EntrepreneurPulse.com.au  Fact Sheet for Healthcare Providers: IncredibleEmployment.be  This test is not yet approved or cleared by the [place] FDA and has been authorized for detection and/or diagnosis of SARS-CoV-2 by FDA under an Emergency Use Authorization (EUA). This EUA will remain in effect (meaning this test can be used) for the duration of the COVID-19 declaration under Section 564(b)(1) of the Act, 21 U.S.C. section 360bbb-3(b)(1), unless the authorization is terminated or revoked.  Performed at [facility], [address] [place], [place] [address]   Urine Culture     Status: Abnormal   Collection Time: 05/22/21  1:20 PM   Specimen: Urine, Clean Catch  Result Value  Ref Range Status   Specimen Description   Final    URINE, CLEAN CATCH Performed at [facility], [address], [place], [place] [address]    Special Requests   Final    NONE Performed at [facility], [address] [place], [place] [address]    Culture MULTIPLE SPECIES PRESENT, SUGGEST RECOLLECTION (A)  Final   Report Status 05/24/2021 FINAL  Final  MRSA Next Gen by PCR, Nasal     Status: None   Collection Time: 05/26/21 10:50 AM   Specimen: Nasal Mucosa; Nasal Swab  Result Value Ref Range Status   MRSA by PCR Next Gen NOT DETECTED NOT DETECTED Final    Comment: (NOTE) The GeneXpert MRSA Assay (FDA approved for NASAL specimens only), is one component of a comprehensive MRSA colonization surveillance program. It is not intended to diagnose MRSA infection nor to guide or monitor treatment for MRSA infections. Test performance is not FDA approved in patients less than 6 years old. Performed at [facility], [address], [place], [place] [address]          Radiology Studies: [facility] Chest [place] 1 View  Result Date: 05/26/2021 CLINICAL DATA:  Increasing  weakness. EXAM: PORTABLE CHEST 1 VIEW COMPARISON:  May 22, 2021 FINDINGS: Enlarged cardiac silhouette. Calcific atherosclerotic disease and tortuosity of the aorta. Mild prominence of the interstitial markings. Calcified granulomas are noted in the bilateral lungs. Osseous structures are without acute abnormality. Soft tissues are grossly normal. IMPRESSION: 1. Enlarged cardiac silhouette. 2. Mild prominence of the interstitial markings may represent mild interstitial pulmonary edema. Electronically Signed   By: Fidela Salisbury M.D.   On: 05/26/2021 12:30        Scheduled Meds:  mouth rinse  15 mL Mouth Rinse BID   Continuous Infusions:     LOS: 6 days    Time spent: 25 minutes    Sidney Ace, MD [facility] Pager [phone]  If 7PM-7AM, please contact night-coverage 05/27/2021, 9:54 AM

## 2021-05-27 NOTE — Progress Notes (Signed)
Manufacturing [employer] hospital Liaison note:  New referral for [facility] home received from [place]. Patient information has been reviewed, hospice home eligibility confirmed. Writer met in the room with patient's son, daughter and their spouses to initiate education regarding hospice services.  Unfortunately hospice home does not have a bed to offer today. Family and hospital care team made aware. Hopsice information and contact numbers given to family.  Hospital Liaison will continue to follow and update family and hospital care team regarding bed availability. Please do not hesitate to call with any hospice related questions.  Flo Shanks BSN, RN, [place] [phone]

## 2021-05-27 NOTE — Progress Notes (Signed)
SLP Cancellation Note  Patient Details Name: TEEA DUCEY MRN: [id number] DOB: [birth year]   Cancelled treatment:       Chart reviewed, Pt visited. Pt not alert enough to take any PO's including ice chips at this time. Family at bedside. Discussed use of chips if Pt wakes and asks for any PO's. Per notes, plans for hospice home when available. If status changes and Pt is able to take any PO's and assessment is indicated in the light of continueing comfort care, please notify ST.   Lucila Maine 05/27/2021, 12:35 PM

## 2021-05-27 NOTE — Care Management Important Message (Signed)
Important Message  Patient Details  Name: Ann Flynn MRN: [id number] Date of Birth: [birth year]   Medicare Important Message Given:  Other (see comment)  On comfort care measures.  Medicare IM withheld at this time.     Dannette Barbara 05/27/2021, 1:24 PM

## 2021-05-27 NOTE — TOC Progression Note (Signed)
Transition of Care [facility]) - Progression Note    Patient Details  Name: Ann Flynn MRN: [id number] Date of Birth: 10-06-1940  Transition of Care [facility]) CM/SW Contact  Beverly Sessions, RN Phone Number: 05/27/2021, 10:32 AM  Clinical Narrative:     Notified that patient on comfort measures Son, daughter, daughter in law, son in law at bedside.   They are in agreement for residential hospice home and request hospice home of [place].  Referral made to Santiago Glad with Manufacturing engineer   Expected Discharge Plan: Skilled Nursing Facility Barriers to Discharge: Continued Medical Work up  Expected Discharge Plan and Services Expected Discharge Plan: [place] Choice: [place] arrangements for the past 2 months: Single Family Home                                       Social Determinants of Health (SDOH) Interventions    Readmission Risk Interventions No flowsheet data found.

## 2021-05-30 ENCOUNTER — Telehealth: Payer: Self-pay

## 2021-05-30 NOTE — Telephone Encounter (Signed)
Copied from CRM #[id number]. Topic: General - Deceased Patient >> May 30, 2021 10:51 AM Todd, Faith N wrote: Reason for CRM: Pts son called in stating he wanted to let PCP know that pt has passed away. 

## 2021-05-30 NOTE — Telephone Encounter (Signed)
Copied from [place] [id number]. Topic: General - Deceased Patient >> 06/07/21 10:51 AM Valere Dross wrote: Reason for CRM: Pts son called in stating he wanted to let PCP know that pt has passed away.

## 2021-06-07 ENCOUNTER — Telehealth: Payer: Medicare Other

## 2021-06-18 NOTE — Death Summary Note (Signed)
80 y.o. female with medical history significant for diastolic CHF, moderate pulmonary hypertension, IDDM 2, CKD stage IV, OSA on CPAP, , a flutter s/p cardioversion on 4/22 on Eliquis and amiodarone, who presents to the ED for the second time in 3 days with complaints of neck pain and knee pain with difficulty bearing weight and difficulty moving around the house where she lives alone with a daughter that lives nearby.  She denies other complaints.  On her first visit to the ED on 10/1 she had an essentially negative work-up and was discharged with Voltaren gel and a Lidoderm patch for suspected neck strain.  She was brought back to the emergency room on 10/3 because family noted her to be increasingly weak, at this time not explained by her neck pain or knee pain.  She had no cough chest pain or shortness of breath, no nausea or vomiting, but family is concerned for her oral intake given decreasing ability to care for herself at home   10/5: Called to bedside by RN this morning.  Patient with increased work of breathing and increasingly somnolent.  Evaluated bedside.  Rhonchorous breath sounds.  Vital signs stable.  3 L..  Blood gas reassuring.   10/6: Foley placed yesterday.  1400 cc urine liberated.  Patient net -1500cc.  Respiratory status remains tenuous.  Mentation improved   10/8: Patient remains very fatigued.  Continues to endorse severe neck pain.  Urine output adequate.  Oxygenation status unchanged.  Vital signs remained stable.   10/9: Patient had a decompensation event yesterday.  Decreased level of responsiveness.  Rapid atrial fibrillation.  Transferred emergently to ICU.  After lengthy discussion with the family in conjunction with nephrology consult decision was made to proceed with full comfort measures.  For dialysis.  This morning patient lethargic.  Verbally unresponsive.  TOC engaged.  We will proceed with hospice referral.  June 14, 2023: After lengthy discussion with family decision was  made to proceed with full comfort measures.  Patient was transitioned to comfort measures on 10/9.  On 10/10 I evaluated patient at bedside.  Found her to be verbally unresponsive.  Notified bedside RN.  Regular regimen of narcotics and benzodiazepines administered to ensure patient comfort.  On 06/14/2023 I was notified by bedside RN that patient had passed away peacefully at 02-16-23 this AM.  Family made aware.  Does pronounced by RN x2.  Time of death: 0615 on 06/14/2023  Problem list AKI on CKD stage IV Acute on chronic diastolic congestive heart failure Intractable neck pain Type 2 diabetes mellitus with hyperglycemia Acute on chronic hypoxic respiratory failure Depression Paroxysmal atrial fibrillation OSA on nightly CPAP  Ralene Muskrat MD

## 2021-06-18 NOTE — Progress Notes (Signed)
Dr Priscella Mann notified of patients death at [phone]

## 2021-06-18 NOTE — Care Management (Signed)
Notified of patients passing this AM at 0615.  Full death summary note to follow

## 2021-06-18 NOTE — Progress Notes (Signed)
  Chaplain On-Call was paged by [employer], who reported that the patient's son wanted to  speak by phone with the Chaplain.  Chaplain called patient's son Abe People, who asked about what the next steps will be following the death of the patient.  Chaplain encouraged Abe People to contact the Administrative Coordinator for the information that he is seeking.  Chaplain Pollyann Samples M.Div., [facility]

## 2021-06-18 NOTE — Progress Notes (Signed)
Pt without respirations, breathing, blood pressure. Verified by nurse x 2, Tawanna Solo RN, Derinda Sis daughter notified at [phone], Campbell Lerner notified at [phone]. Nursing supervisor notified, Dr Sidney Ace notified of patients death

## 2021-06-18 DEATH — deceased

## 2021-07-23 ENCOUNTER — Encounter (HOSPITAL_COMMUNITY): Payer: Medicare Other | Admitting: Internal Medicine

## 2021-08-20 ENCOUNTER — Ambulatory Visit: Payer: Medicare Other | Admitting: Family Medicine

## 2021-09-11 IMAGING — CR DG LUMBAR SPINE COMPLETE 4+V
1 series · 6 of 6 positions shown · non-contrast
Comparison: None.

CLINICAL DATA: Acute lower back and left hip pain without known
injury.

EXAM:
LUMBAR SPINE - COMPLETE 4+ VIEW

[Series 1: dg lumbar spine complete 4 +v · 0.14mm/px · 6 of 6 slices shown]
[im 1/6]
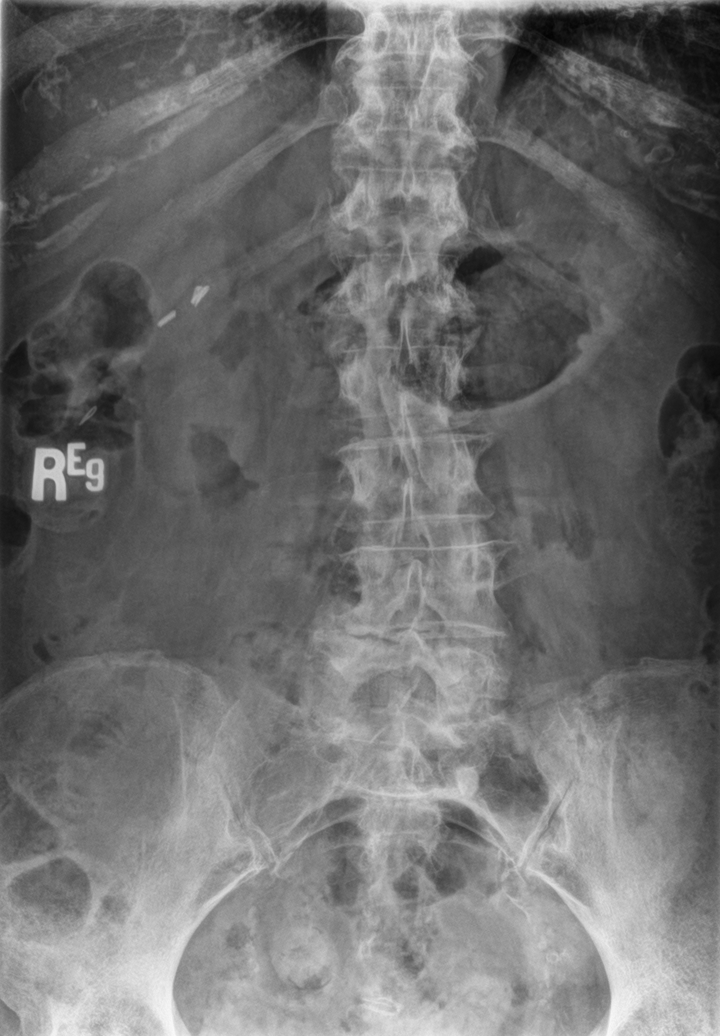
[im 2/6]
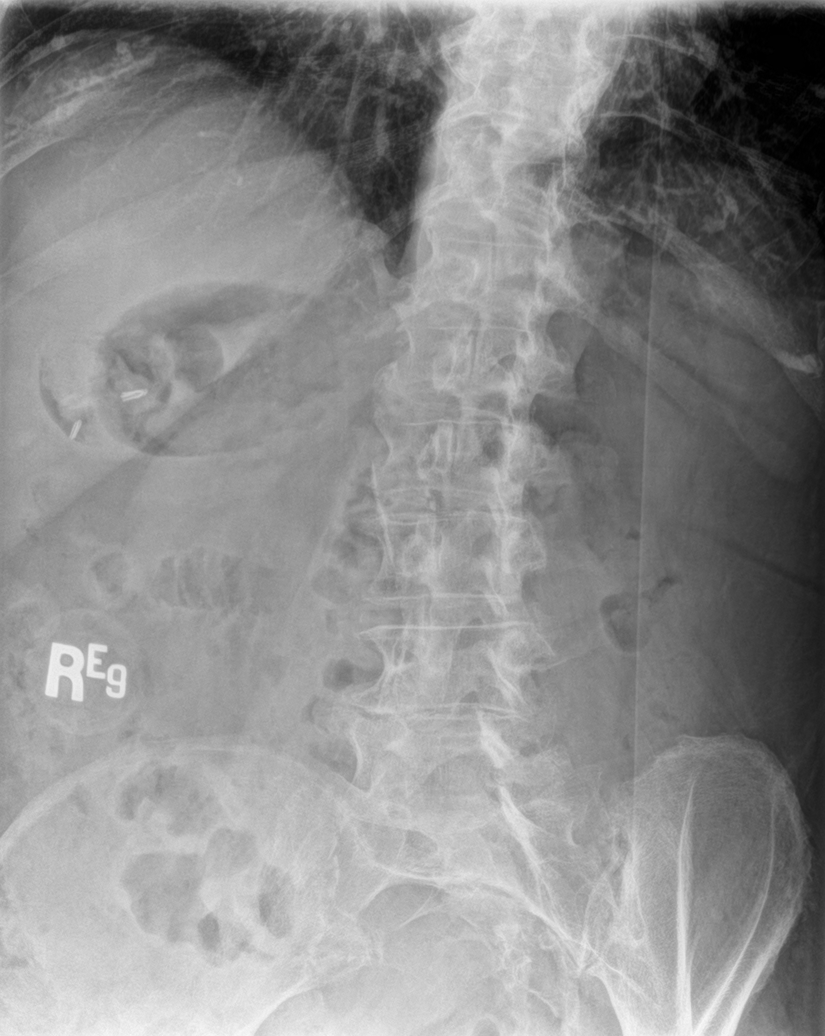
[im 3/6]
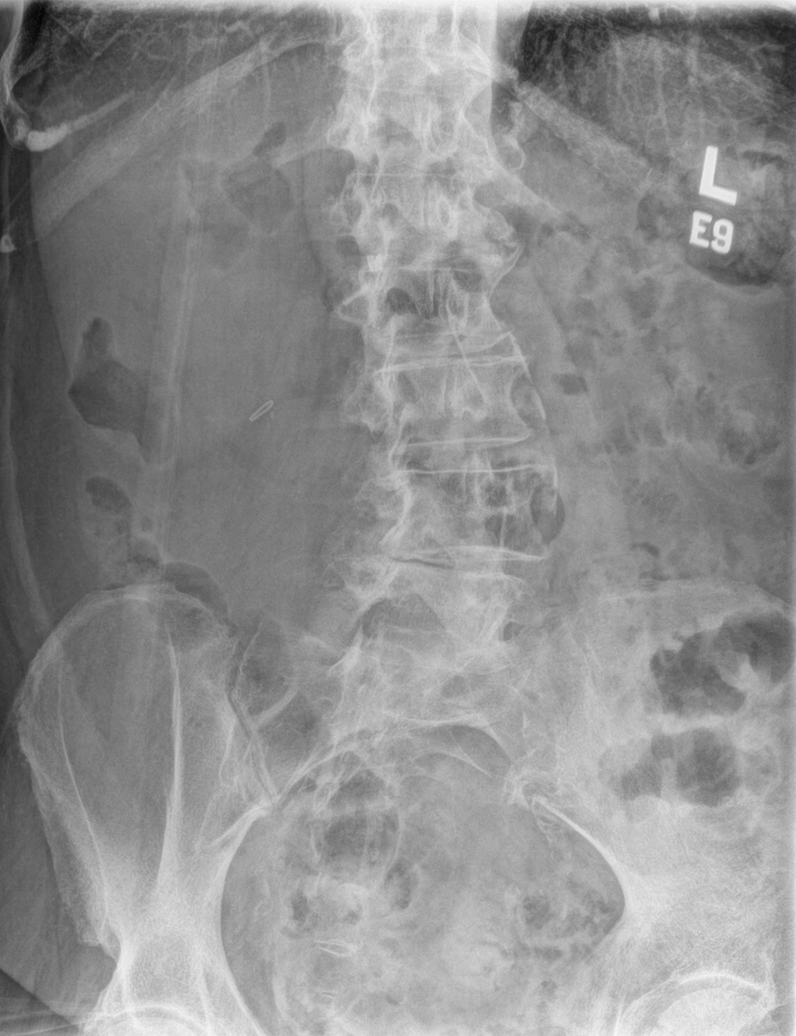
[im 4/6]
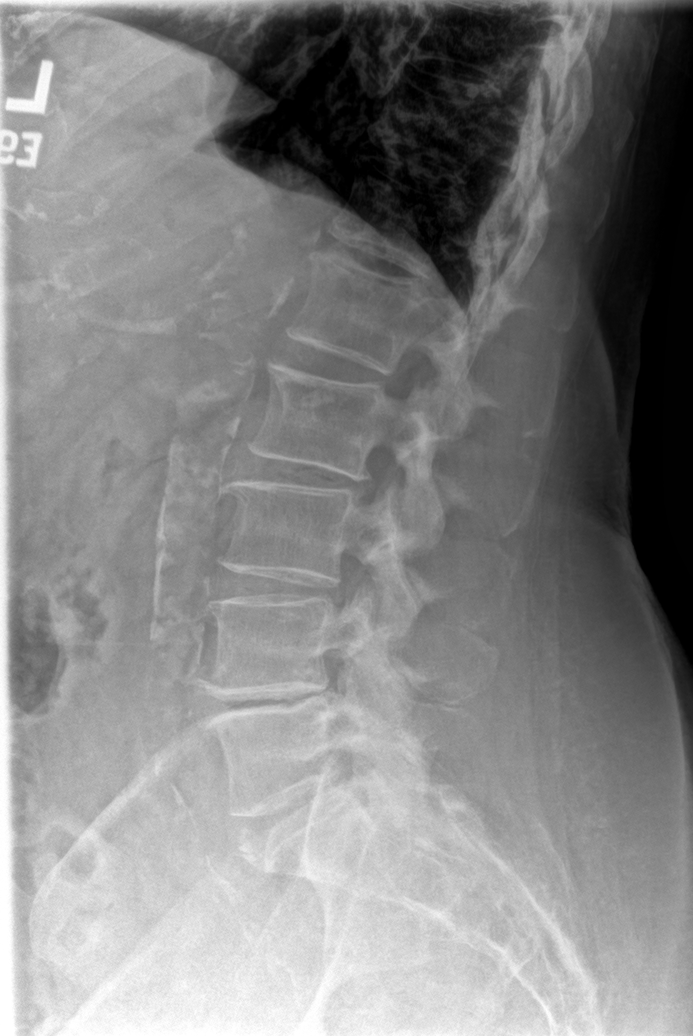
[im 5/6]
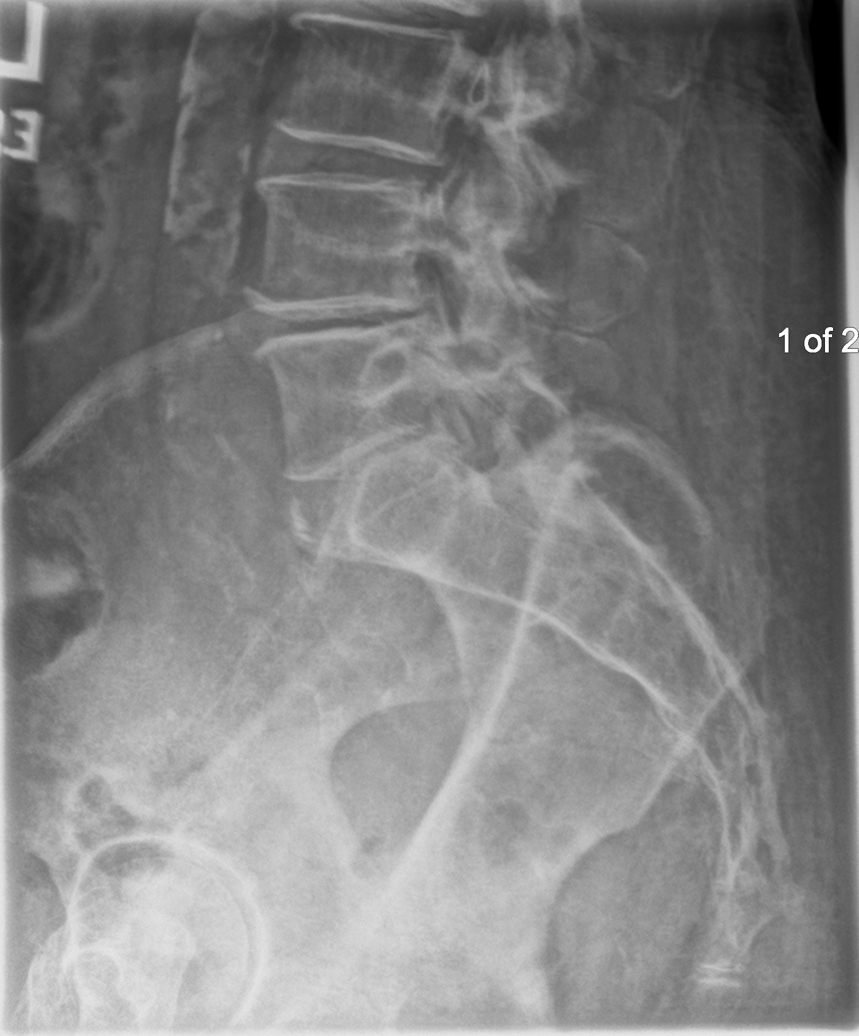
[im 6/6]
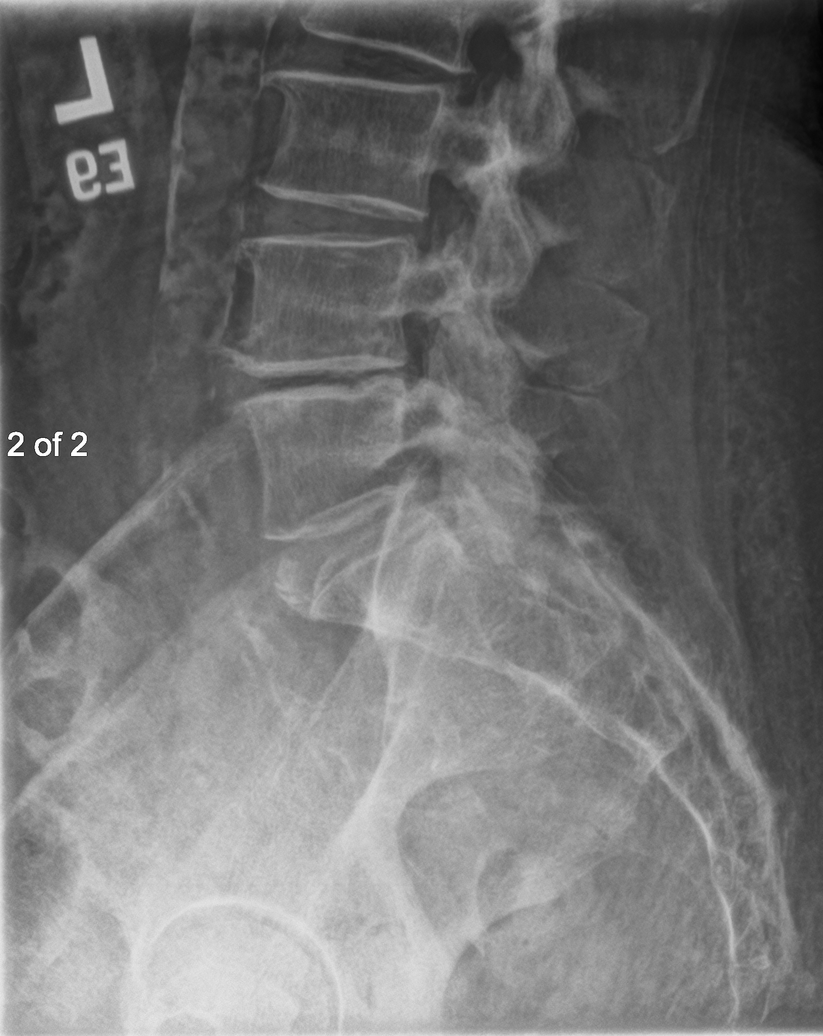

[6 of 6 positions shown; findings below may reference images not displayed]

FINDINGS: No fracture or spondylolisthesis is noted. Severe degenerative disc
disease is noted at L4-5. Mild degenerative disc disease is noted at
L2-3 and L3-4.
IMPRESSION: Multilevel degenerative disc disease. No acute abnormality seen in
the lumbar spine.

Aortic Atherosclerosis ([id number]).
